# Patient Record
Sex: Female | Born: 1945
Health system: Southern US, Community
[De-identification: ages and names within clinical notes are randomized; demographics above are authoritative.]

## PROBLEM LIST (undated history)

## (undated) DIAGNOSIS — K219 Gastro-esophageal reflux disease without esophagitis: Secondary | ICD-10-CM

## (undated) DIAGNOSIS — M5136 Other intervertebral disc degeneration, lumbar region: Secondary | ICD-10-CM

## (undated) DIAGNOSIS — M47816 Spondylosis without myelopathy or radiculopathy, lumbar region: Secondary | ICD-10-CM

## (undated) DIAGNOSIS — J383 Other diseases of vocal cords: Secondary | ICD-10-CM

## (undated) DIAGNOSIS — J42 Unspecified chronic bronchitis: Secondary | ICD-10-CM

## (undated) DIAGNOSIS — K449 Diaphragmatic hernia without obstruction or gangrene: Secondary | ICD-10-CM

## (undated) DIAGNOSIS — D649 Anemia, unspecified: Secondary | ICD-10-CM

## (undated) DIAGNOSIS — K222 Esophageal obstruction: Secondary | ICD-10-CM

## (undated) DIAGNOSIS — M1710 Unilateral primary osteoarthritis, unspecified knee: Secondary | ICD-10-CM

## (undated) DIAGNOSIS — K635 Polyp of colon: Secondary | ICD-10-CM

## (undated) DIAGNOSIS — H269 Unspecified cataract: Secondary | ICD-10-CM

## (undated) DIAGNOSIS — I639 Cerebral infarction, unspecified: Secondary | ICD-10-CM

## (undated) DIAGNOSIS — M179 Osteoarthritis of knee, unspecified: Secondary | ICD-10-CM

## (undated) DIAGNOSIS — T7840XA Allergy, unspecified, initial encounter: Secondary | ICD-10-CM

## (undated) DIAGNOSIS — E785 Hyperlipidemia, unspecified: Secondary | ICD-10-CM

## (undated) DIAGNOSIS — J449 Chronic obstructive pulmonary disease, unspecified: Secondary | ICD-10-CM

## (undated) HISTORY — DX: Other intervertebral disc degeneration, lumbar region: M51.36

## (undated) HISTORY — DX: Unspecified cataract: H26.9

## (undated) HISTORY — DX: Other diseases of vocal cords: J38.3

## (undated) HISTORY — DX: Hyperlipidemia, unspecified: E78.5

## (undated) HISTORY — DX: Spondylosis without myelopathy or radiculopathy, lumbar region: M47.816

## (undated) HISTORY — DX: Gastro-esophageal reflux disease without esophagitis: K21.9

## (undated) HISTORY — DX: Esophageal obstruction: K22.2

## (undated) HISTORY — PX: COLONOSCOPY W/ BIOPSIES AND POLYPECTOMY: SHX1376

## (undated) HISTORY — DX: Diaphragmatic hernia without obstruction or gangrene: K44.9

## (undated) HISTORY — PX: CARPAL TUNNEL RELEASE: SHX101

## (undated) HISTORY — PX: ESOPHAGOGASTRODUODENOSCOPY (EGD) WITH ESOPHAGEAL DILATION: SHX5812

## (undated) HISTORY — DX: Anemia, unspecified: D64.9

## (undated) HISTORY — PX: WISDOM TOOTH EXTRACTION: SHX21

## (undated) HISTORY — DX: Osteoarthritis of knee, unspecified: M17.9

## (undated) HISTORY — PX: TONSILLECTOMY: SUR1361

## (undated) HISTORY — DX: Cerebral infarction, unspecified: I63.9

## (undated) HISTORY — DX: Polyp of colon: K63.5

## (undated) HISTORY — PX: TOTAL ABDOMINAL HYSTERECTOMY: SHX209

## (undated) HISTORY — DX: Unilateral primary osteoarthritis, unspecified knee: M17.10

## (undated) HISTORY — DX: Allergy, unspecified, initial encounter: T78.40XA

## (undated) HISTORY — PX: FOOT SURGERY: SHX648

## (undated) SURGERY — VIDEO BRONCHOSCOPY WITHOUT FLUORO
Anesthesia: Moderate Sedation

---

## 1970-12-05 HISTORY — PX: HEMORROIDECTOMY: SUR656

## 1998-07-02 ENCOUNTER — Inpatient Hospital Stay (HOSPITAL_COMMUNITY): Admission: AD | Admit: 1998-07-02 | Discharge: 1998-07-07 | Payer: Self-pay | Admitting: Family Medicine

## 1998-09-14 ENCOUNTER — Other Ambulatory Visit: Admission: RE | Admit: 1998-09-14 | Discharge: 1998-09-14 | Payer: Self-pay | Admitting: Internal Medicine

## 1999-12-30 ENCOUNTER — Encounter: Payer: Self-pay | Admitting: Family Medicine

## 1999-12-30 ENCOUNTER — Ambulatory Visit (HOSPITAL_COMMUNITY): Admission: RE | Admit: 1999-12-30 | Discharge: 1999-12-30 | Payer: Self-pay | Admitting: Family Medicine

## 2001-09-04 ENCOUNTER — Encounter (INDEPENDENT_AMBULATORY_CARE_PROVIDER_SITE_OTHER): Payer: Self-pay | Admitting: *Deleted

## 2001-09-04 ENCOUNTER — Encounter: Payer: Self-pay | Admitting: Emergency Medicine

## 2001-09-04 ENCOUNTER — Emergency Department (HOSPITAL_COMMUNITY): Admission: EM | Admit: 2001-09-04 | Discharge: 2001-09-05 | Payer: Self-pay | Admitting: Emergency Medicine

## 2001-09-11 ENCOUNTER — Encounter: Payer: Self-pay | Admitting: Cardiovascular Disease

## 2001-09-11 ENCOUNTER — Ambulatory Visit (HOSPITAL_COMMUNITY): Admission: RE | Admit: 2001-09-11 | Discharge: 2001-09-11 | Payer: Self-pay | Admitting: Cardiovascular Disease

## 2001-09-28 ENCOUNTER — Ambulatory Visit: Admission: RE | Admit: 2001-09-28 | Discharge: 2001-09-28 | Payer: Self-pay | Admitting: Pulmonary Disease

## 2001-11-29 ENCOUNTER — Encounter: Payer: Self-pay | Admitting: Emergency Medicine

## 2001-11-29 ENCOUNTER — Emergency Department (HOSPITAL_COMMUNITY): Admission: EM | Admit: 2001-11-29 | Discharge: 2001-11-29 | Payer: Self-pay | Admitting: Emergency Medicine

## 2001-12-01 ENCOUNTER — Encounter: Payer: Self-pay | Admitting: Emergency Medicine

## 2001-12-01 ENCOUNTER — Emergency Department (HOSPITAL_COMMUNITY): Admission: EM | Admit: 2001-12-01 | Discharge: 2001-12-01 | Payer: Self-pay | Admitting: *Deleted

## 2002-01-04 ENCOUNTER — Ambulatory Visit: Admission: RE | Admit: 2002-01-04 | Discharge: 2002-01-04 | Payer: Self-pay | Admitting: Pulmonary Disease

## 2002-01-09 ENCOUNTER — Inpatient Hospital Stay (HOSPITAL_COMMUNITY): Admission: EM | Admit: 2002-01-09 | Discharge: 2002-01-14 | Payer: Self-pay | Admitting: Emergency Medicine

## 2002-01-09 ENCOUNTER — Encounter: Payer: Self-pay | Admitting: Pulmonary Disease

## 2002-01-11 ENCOUNTER — Encounter: Payer: Self-pay | Admitting: Pulmonary Disease

## 2002-08-01 ENCOUNTER — Encounter: Payer: Self-pay | Admitting: Internal Medicine

## 2002-08-01 ENCOUNTER — Encounter: Admission: RE | Admit: 2002-08-01 | Discharge: 2002-08-01 | Payer: Self-pay | Admitting: Internal Medicine

## 2002-11-20 ENCOUNTER — Encounter: Payer: Self-pay | Admitting: Pulmonary Disease

## 2002-11-20 ENCOUNTER — Inpatient Hospital Stay (HOSPITAL_COMMUNITY): Admission: EM | Admit: 2002-11-20 | Discharge: 2002-11-22 | Payer: Self-pay | Admitting: Emergency Medicine

## 2002-11-22 ENCOUNTER — Encounter (INDEPENDENT_AMBULATORY_CARE_PROVIDER_SITE_OTHER): Payer: Self-pay | Admitting: *Deleted

## 2003-09-23 ENCOUNTER — Emergency Department (HOSPITAL_COMMUNITY): Admission: EM | Admit: 2003-09-23 | Discharge: 2003-09-23 | Payer: Self-pay | Admitting: Physical Therapy

## 2004-02-01 ENCOUNTER — Emergency Department (HOSPITAL_COMMUNITY): Admission: EM | Admit: 2004-02-01 | Discharge: 2004-02-01 | Payer: Self-pay | Admitting: Emergency Medicine

## 2004-02-04 ENCOUNTER — Inpatient Hospital Stay (HOSPITAL_COMMUNITY): Admission: AD | Admit: 2004-02-04 | Discharge: 2004-02-09 | Payer: Self-pay | Admitting: Pulmonary Disease

## 2004-02-09 ENCOUNTER — Encounter (INDEPENDENT_AMBULATORY_CARE_PROVIDER_SITE_OTHER): Payer: Self-pay | Admitting: *Deleted

## 2004-03-22 ENCOUNTER — Encounter: Admission: RE | Admit: 2004-03-22 | Discharge: 2004-03-22 | Payer: Self-pay | Admitting: Internal Medicine

## 2004-05-18 ENCOUNTER — Encounter: Payer: Self-pay | Admitting: Internal Medicine

## 2004-10-13 ENCOUNTER — Ambulatory Visit: Payer: Self-pay | Admitting: Pulmonary Disease

## 2004-10-20 ENCOUNTER — Ambulatory Visit: Payer: Self-pay | Admitting: Pulmonary Disease

## 2004-10-22 ENCOUNTER — Ambulatory Visit (HOSPITAL_COMMUNITY): Admission: RE | Admit: 2004-10-22 | Discharge: 2004-10-22 | Payer: Self-pay | Admitting: Internal Medicine

## 2004-10-22 ENCOUNTER — Ambulatory Visit: Payer: Self-pay | Admitting: Pulmonary Disease

## 2004-10-22 ENCOUNTER — Ambulatory Visit: Payer: Self-pay | Admitting: Internal Medicine

## 2004-11-19 ENCOUNTER — Ambulatory Visit: Payer: Self-pay | Admitting: Pulmonary Disease

## 2004-12-08 ENCOUNTER — Ambulatory Visit: Payer: Self-pay | Admitting: Pulmonary Disease

## 2004-12-31 ENCOUNTER — Ambulatory Visit: Payer: Self-pay | Admitting: Pulmonary Disease

## 2005-01-14 ENCOUNTER — Ambulatory Visit: Payer: Self-pay | Admitting: Pulmonary Disease

## 2005-02-14 ENCOUNTER — Ambulatory Visit: Payer: Self-pay | Admitting: Pulmonary Disease

## 2005-02-15 ENCOUNTER — Ambulatory Visit (HOSPITAL_COMMUNITY): Admission: RE | Admit: 2005-02-15 | Discharge: 2005-02-15 | Payer: Self-pay | Admitting: Pulmonary Disease

## 2005-02-16 ENCOUNTER — Ambulatory Visit: Payer: Self-pay | Admitting: Internal Medicine

## 2005-03-01 ENCOUNTER — Ambulatory Visit: Payer: Self-pay | Admitting: Pulmonary Disease

## 2005-04-19 ENCOUNTER — Ambulatory Visit: Payer: Self-pay | Admitting: Pulmonary Disease

## 2005-04-26 ENCOUNTER — Ambulatory Visit: Payer: Self-pay | Admitting: Pulmonary Disease

## 2005-05-20 ENCOUNTER — Ambulatory Visit: Payer: Self-pay | Admitting: Pulmonary Disease

## 2005-06-29 ENCOUNTER — Ambulatory Visit: Payer: Self-pay | Admitting: Critical Care Medicine

## 2005-07-20 ENCOUNTER — Ambulatory Visit: Payer: Self-pay | Admitting: Pulmonary Disease

## 2005-08-03 ENCOUNTER — Ambulatory Visit: Payer: Self-pay | Admitting: Pulmonary Disease

## 2005-09-02 ENCOUNTER — Ambulatory Visit: Payer: Self-pay | Admitting: Pulmonary Disease

## 2005-09-02 ENCOUNTER — Ambulatory Visit: Payer: Self-pay | Admitting: Internal Medicine

## 2005-09-26 ENCOUNTER — Ambulatory Visit: Payer: Self-pay | Admitting: Internal Medicine

## 2005-10-02 ENCOUNTER — Encounter: Admission: RE | Admit: 2005-10-02 | Discharge: 2005-10-02 | Payer: Self-pay | Admitting: Neurosurgery

## 2005-10-10 ENCOUNTER — Ambulatory Visit (HOSPITAL_COMMUNITY): Admission: RE | Admit: 2005-10-10 | Discharge: 2005-10-10 | Payer: Self-pay | Admitting: Neurosurgery

## 2005-11-02 ENCOUNTER — Ambulatory Visit: Payer: Self-pay | Admitting: Pulmonary Disease

## 2005-11-14 ENCOUNTER — Ambulatory Visit: Payer: Self-pay | Admitting: Internal Medicine

## 2005-11-30 ENCOUNTER — Ambulatory Visit: Payer: Self-pay | Admitting: Pulmonary Disease

## 2005-12-08 ENCOUNTER — Ambulatory Visit: Payer: Self-pay | Admitting: Internal Medicine

## 2005-12-08 ENCOUNTER — Ambulatory Visit (HOSPITAL_COMMUNITY): Admission: RE | Admit: 2005-12-08 | Discharge: 2005-12-08 | Payer: Self-pay | Admitting: Internal Medicine

## 2005-12-26 ENCOUNTER — Ambulatory Visit (HOSPITAL_COMMUNITY): Admission: RE | Admit: 2005-12-26 | Discharge: 2005-12-26 | Payer: Self-pay | Admitting: Neurosurgery

## 2006-01-03 ENCOUNTER — Ambulatory Visit: Payer: Self-pay | Admitting: Pulmonary Disease

## 2006-01-23 ENCOUNTER — Ambulatory Visit: Payer: Self-pay | Admitting: Internal Medicine

## 2006-01-26 ENCOUNTER — Ambulatory Visit: Payer: Self-pay | Admitting: Pulmonary Disease

## 2006-02-22 ENCOUNTER — Ambulatory Visit: Payer: Self-pay | Admitting: Pulmonary Disease

## 2006-02-27 ENCOUNTER — Ambulatory Visit: Payer: Self-pay | Admitting: Internal Medicine

## 2006-02-27 ENCOUNTER — Inpatient Hospital Stay (HOSPITAL_COMMUNITY): Admission: EM | Admit: 2006-02-27 | Discharge: 2006-03-08 | Payer: Self-pay | Admitting: Emergency Medicine

## 2006-03-02 ENCOUNTER — Ambulatory Visit: Payer: Self-pay | Admitting: Pulmonary Disease

## 2006-03-15 ENCOUNTER — Ambulatory Visit: Payer: Self-pay | Admitting: Internal Medicine

## 2006-03-27 ENCOUNTER — Ambulatory Visit: Payer: Self-pay | Admitting: Internal Medicine

## 2006-04-07 ENCOUNTER — Ambulatory Visit: Payer: Self-pay | Admitting: Pulmonary Disease

## 2006-04-18 ENCOUNTER — Ambulatory Visit (HOSPITAL_COMMUNITY): Admission: RE | Admit: 2006-04-18 | Discharge: 2006-04-18 | Payer: Self-pay | Admitting: Internal Medicine

## 2006-04-18 ENCOUNTER — Ambulatory Visit: Payer: Self-pay | Admitting: Internal Medicine

## 2006-06-12 ENCOUNTER — Ambulatory Visit: Payer: Self-pay | Admitting: Pulmonary Disease

## 2006-08-04 ENCOUNTER — Ambulatory Visit: Payer: Self-pay | Admitting: Pulmonary Disease

## 2006-10-19 ENCOUNTER — Ambulatory Visit: Payer: Self-pay | Admitting: Internal Medicine

## 2006-10-19 ENCOUNTER — Ambulatory Visit: Payer: Self-pay | Admitting: Pulmonary Disease

## 2006-10-30 ENCOUNTER — Ambulatory Visit: Payer: Self-pay

## 2006-12-07 ENCOUNTER — Ambulatory Visit: Payer: Self-pay | Admitting: Emergency Medicine

## 2007-01-24 ENCOUNTER — Ambulatory Visit: Payer: Self-pay | Admitting: Endocrinology

## 2007-01-31 ENCOUNTER — Ambulatory Visit: Payer: Self-pay | Admitting: Pulmonary Disease

## 2007-02-06 ENCOUNTER — Encounter: Admission: RE | Admit: 2007-02-06 | Discharge: 2007-02-06 | Payer: Self-pay | Admitting: Endocrinology

## 2007-02-08 ENCOUNTER — Ambulatory Visit: Payer: Self-pay | Admitting: Critical Care Medicine

## 2007-04-16 ENCOUNTER — Ambulatory Visit: Payer: Self-pay | Admitting: Pulmonary Disease

## 2007-06-05 ENCOUNTER — Ambulatory Visit: Payer: Self-pay | Admitting: Internal Medicine

## 2007-06-14 ENCOUNTER — Ambulatory Visit: Payer: Self-pay | Admitting: Internal Medicine

## 2007-06-19 ENCOUNTER — Ambulatory Visit: Payer: Self-pay | Admitting: Pulmonary Disease

## 2007-06-26 ENCOUNTER — Ambulatory Visit: Payer: Self-pay | Admitting: Internal Medicine

## 2007-07-26 ENCOUNTER — Ambulatory Visit: Payer: Self-pay | Admitting: Emergency Medicine

## 2007-08-27 ENCOUNTER — Ambulatory Visit: Payer: Self-pay | Admitting: Emergency Medicine

## 2007-09-05 DIAGNOSIS — F410 Panic disorder [episodic paroxysmal anxiety] without agoraphobia: Secondary | ICD-10-CM

## 2007-09-05 DIAGNOSIS — J383 Other diseases of vocal cords: Secondary | ICD-10-CM

## 2007-09-05 DIAGNOSIS — J45909 Unspecified asthma, uncomplicated: Secondary | ICD-10-CM | POA: Insufficient documentation

## 2007-09-05 DIAGNOSIS — J309 Allergic rhinitis, unspecified: Secondary | ICD-10-CM | POA: Insufficient documentation

## 2007-09-05 DIAGNOSIS — K219 Gastro-esophageal reflux disease without esophagitis: Secondary | ICD-10-CM | POA: Insufficient documentation

## 2007-10-03 ENCOUNTER — Ambulatory Visit: Payer: Self-pay | Admitting: Emergency Medicine

## 2007-11-12 ENCOUNTER — Ambulatory Visit: Payer: Self-pay | Admitting: Emergency Medicine

## 2007-12-12 ENCOUNTER — Ambulatory Visit: Payer: Self-pay | Admitting: Emergency Medicine

## 2008-01-11 ENCOUNTER — Ambulatory Visit: Payer: Self-pay | Admitting: Emergency Medicine

## 2008-02-11 ENCOUNTER — Ambulatory Visit: Payer: Self-pay | Admitting: Emergency Medicine

## 2008-03-03 ENCOUNTER — Telehealth (INDEPENDENT_AMBULATORY_CARE_PROVIDER_SITE_OTHER): Payer: Self-pay | Admitting: *Deleted

## 2008-03-07 ENCOUNTER — Ambulatory Visit: Payer: Self-pay | Admitting: Emergency Medicine

## 2008-03-07 DIAGNOSIS — J069 Acute upper respiratory infection, unspecified: Secondary | ICD-10-CM | POA: Insufficient documentation

## 2008-03-13 ENCOUNTER — Telehealth (INDEPENDENT_AMBULATORY_CARE_PROVIDER_SITE_OTHER): Payer: Self-pay | Admitting: *Deleted

## 2008-03-13 ENCOUNTER — Ambulatory Visit: Payer: Self-pay | Admitting: Pulmonary Disease

## 2008-03-13 DIAGNOSIS — J209 Acute bronchitis, unspecified: Secondary | ICD-10-CM

## 2008-05-01 ENCOUNTER — Telehealth: Payer: Self-pay | Admitting: Emergency Medicine

## 2008-06-19 ENCOUNTER — Telehealth (INDEPENDENT_AMBULATORY_CARE_PROVIDER_SITE_OTHER): Payer: Self-pay | Admitting: *Deleted

## 2008-06-19 ENCOUNTER — Inpatient Hospital Stay (HOSPITAL_COMMUNITY): Admission: EM | Admit: 2008-06-19 | Discharge: 2008-06-24 | Payer: Self-pay | Admitting: Emergency Medicine

## 2008-06-19 ENCOUNTER — Ambulatory Visit: Payer: Self-pay | Admitting: Internal Medicine

## 2008-06-30 ENCOUNTER — Ambulatory Visit: Payer: Self-pay | Admitting: Internal Medicine

## 2008-06-30 DIAGNOSIS — E782 Mixed hyperlipidemia: Secondary | ICD-10-CM | POA: Insufficient documentation

## 2008-06-30 DIAGNOSIS — K222 Esophageal obstruction: Secondary | ICD-10-CM | POA: Insufficient documentation

## 2008-06-30 DIAGNOSIS — Z860100 Personal history of colon polyps, unspecified: Secondary | ICD-10-CM | POA: Insufficient documentation

## 2008-06-30 DIAGNOSIS — B029 Zoster without complications: Secondary | ICD-10-CM | POA: Insufficient documentation

## 2008-06-30 DIAGNOSIS — E119 Type 2 diabetes mellitus without complications: Secondary | ICD-10-CM

## 2008-06-30 DIAGNOSIS — M810 Age-related osteoporosis without current pathological fracture: Secondary | ICD-10-CM | POA: Insufficient documentation

## 2008-06-30 DIAGNOSIS — E785 Hyperlipidemia, unspecified: Secondary | ICD-10-CM | POA: Insufficient documentation

## 2008-06-30 DIAGNOSIS — M79609 Pain in unspecified limb: Secondary | ICD-10-CM

## 2008-07-01 ENCOUNTER — Ambulatory Visit: Payer: Self-pay | Admitting: Internal Medicine

## 2008-08-05 ENCOUNTER — Ambulatory Visit: Payer: Self-pay | Admitting: Emergency Medicine

## 2008-09-03 ENCOUNTER — Ambulatory Visit: Payer: Self-pay | Admitting: Emergency Medicine

## 2008-11-05 ENCOUNTER — Ambulatory Visit: Payer: Self-pay | Admitting: Emergency Medicine

## 2008-11-05 DIAGNOSIS — R0683 Snoring: Secondary | ICD-10-CM | POA: Insufficient documentation

## 2008-12-16 ENCOUNTER — Encounter: Payer: Self-pay | Admitting: Internal Medicine

## 2008-12-18 ENCOUNTER — Ambulatory Visit: Payer: Self-pay | Admitting: Pulmonary Disease

## 2008-12-18 ENCOUNTER — Ambulatory Visit (HOSPITAL_BASED_OUTPATIENT_CLINIC_OR_DEPARTMENT_OTHER): Admission: RE | Admit: 2008-12-18 | Discharge: 2008-12-18 | Payer: Self-pay | Admitting: Emergency Medicine

## 2008-12-22 ENCOUNTER — Encounter: Payer: Self-pay | Admitting: Internal Medicine

## 2009-02-02 ENCOUNTER — Telehealth (INDEPENDENT_AMBULATORY_CARE_PROVIDER_SITE_OTHER): Payer: Self-pay | Admitting: *Deleted

## 2009-02-05 ENCOUNTER — Ambulatory Visit: Payer: Self-pay | Admitting: Emergency Medicine

## 2009-02-05 ENCOUNTER — Telehealth: Payer: Self-pay | Admitting: Internal Medicine

## 2009-02-16 ENCOUNTER — Ambulatory Visit: Payer: Self-pay | Admitting: Internal Medicine

## 2009-02-16 DIAGNOSIS — R1310 Dysphagia, unspecified: Secondary | ICD-10-CM | POA: Insufficient documentation

## 2009-02-20 ENCOUNTER — Ambulatory Visit: Payer: Self-pay | Admitting: Internal Medicine

## 2009-03-19 ENCOUNTER — Ambulatory Visit: Payer: Self-pay | Admitting: Emergency Medicine

## 2009-03-20 ENCOUNTER — Ambulatory Visit: Payer: Self-pay | Admitting: Internal Medicine

## 2009-03-20 DIAGNOSIS — R5381 Other malaise: Secondary | ICD-10-CM

## 2009-03-20 DIAGNOSIS — R252 Cramp and spasm: Secondary | ICD-10-CM | POA: Insufficient documentation

## 2009-03-20 DIAGNOSIS — R5383 Other fatigue: Secondary | ICD-10-CM

## 2009-03-20 DIAGNOSIS — I8000 Phlebitis and thrombophlebitis of superficial vessels of unspecified lower extremity: Secondary | ICD-10-CM | POA: Insufficient documentation

## 2009-03-20 LAB — CONVERTED CEMR LAB
Basophils Relative: 1.1 % (ref 0.0–3.0)
Calcium: 9 mg/dL (ref 8.4–10.5)
Cholesterol: 144 mg/dL (ref 0–200)
Creatinine,U: 192.2 mg/dL
Eosinophils Absolute: 0.3 10*3/uL (ref 0.0–0.7)
Eosinophils Relative: 4.5 % (ref 0.0–5.0)
Folate: 12.1 ng/mL
Glucose, Bld: 84 mg/dL (ref 70–99)
HDL: 49.6 mg/dL (ref >39.00)
Hemoglobin: 12.1 g/dL (ref 12.0–15.0)
Lymphs Abs: 2.4 10*3/uL (ref 0.7–4.0)
MCHC: 32.8 g/dL (ref 30.0–36.0)
Microalb Creat Ratio: 13 mg/g (ref 0.0–30.0)
Monocytes Absolute: 0.5 10*3/uL (ref 0.1–1.0)
Neutro Abs: 4.1 10*3/uL (ref 1.4–7.7)
Platelets: 368 10*3/uL (ref 150.0–400.0)
Potassium: 3.6 meq/L (ref 3.5–5.1)
Total CHOL/HDL Ratio: 3
VLDL: 17.4 mg/dL (ref 0.0–40.0)

## 2009-04-17 ENCOUNTER — Telehealth (INDEPENDENT_AMBULATORY_CARE_PROVIDER_SITE_OTHER): Payer: Self-pay | Admitting: *Deleted

## 2009-04-21 ENCOUNTER — Telehealth (INDEPENDENT_AMBULATORY_CARE_PROVIDER_SITE_OTHER): Payer: Self-pay | Admitting: *Deleted

## 2009-04-29 ENCOUNTER — Encounter: Payer: Self-pay | Admitting: Emergency Medicine

## 2009-07-17 ENCOUNTER — Ambulatory Visit: Payer: Self-pay | Admitting: Emergency Medicine

## 2009-10-05 ENCOUNTER — Ambulatory Visit: Payer: Self-pay | Admitting: Internal Medicine

## 2009-10-05 ENCOUNTER — Ambulatory Visit: Payer: Self-pay | Admitting: Emergency Medicine

## 2009-10-07 ENCOUNTER — Telehealth: Payer: Self-pay | Admitting: Internal Medicine

## 2009-10-08 ENCOUNTER — Ambulatory Visit: Payer: Self-pay | Admitting: Internal Medicine

## 2009-10-09 ENCOUNTER — Telehealth: Payer: Self-pay | Admitting: Emergency Medicine

## 2009-10-14 ENCOUNTER — Ambulatory Visit: Payer: Self-pay | Admitting: Internal Medicine

## 2009-10-21 ENCOUNTER — Encounter (INDEPENDENT_AMBULATORY_CARE_PROVIDER_SITE_OTHER): Payer: Self-pay | Admitting: *Deleted

## 2009-10-27 ENCOUNTER — Ambulatory Visit: Payer: Self-pay | Admitting: Emergency Medicine

## 2009-10-27 ENCOUNTER — Telehealth: Payer: Self-pay | Admitting: Emergency Medicine

## 2010-01-14 ENCOUNTER — Encounter: Payer: Self-pay | Admitting: Internal Medicine

## 2010-01-21 ENCOUNTER — Ambulatory Visit: Payer: Self-pay | Admitting: Emergency Medicine

## 2010-02-03 ENCOUNTER — Ambulatory Visit: Payer: Self-pay | Admitting: Emergency Medicine

## 2010-03-23 ENCOUNTER — Telehealth: Payer: Self-pay | Admitting: Internal Medicine

## 2010-03-23 DIAGNOSIS — N760 Acute vaginitis: Secondary | ICD-10-CM | POA: Insufficient documentation

## 2010-03-24 ENCOUNTER — Encounter (INDEPENDENT_AMBULATORY_CARE_PROVIDER_SITE_OTHER): Payer: Self-pay | Admitting: *Deleted

## 2010-04-09 ENCOUNTER — Encounter: Payer: Self-pay | Admitting: Emergency Medicine

## 2010-04-30 ENCOUNTER — Encounter: Payer: Self-pay | Admitting: Internal Medicine

## 2010-05-06 ENCOUNTER — Telehealth: Payer: Self-pay | Admitting: Emergency Medicine

## 2010-05-20 ENCOUNTER — Ambulatory Visit: Payer: Self-pay | Admitting: Emergency Medicine

## 2010-05-20 DIAGNOSIS — D239 Other benign neoplasm of skin, unspecified: Secondary | ICD-10-CM | POA: Insufficient documentation

## 2010-07-05 ENCOUNTER — Encounter: Payer: Self-pay | Admitting: Internal Medicine

## 2010-07-05 ENCOUNTER — Ambulatory Visit: Payer: Self-pay | Admitting: Internal Medicine

## 2010-07-05 DIAGNOSIS — N959 Unspecified menopausal and perimenopausal disorder: Secondary | ICD-10-CM | POA: Insufficient documentation

## 2010-07-05 DIAGNOSIS — M1990 Unspecified osteoarthritis, unspecified site: Secondary | ICD-10-CM | POA: Insufficient documentation

## 2010-07-05 DIAGNOSIS — M1710 Unilateral primary osteoarthritis, unspecified knee: Secondary | ICD-10-CM

## 2010-07-05 LAB — CONVERTED CEMR LAB
AST: 20 units/L (ref 0–37)
Alkaline Phosphatase: 68 units/L (ref 39–117)
Basophils Absolute: 0 10*3/uL (ref 0.0–0.1)
Bilirubin Urine: NEGATIVE
CO2: 29 meq/L (ref 19–32)
Eosinophils Relative: 3.5 % (ref 0.0–5.0)
Folate: 9.2 ng/mL
GFR calc non Af Amer: 96.7 mL/min (ref >60)
HCT: 38.1 % (ref 36.0–46.0)
Hemoglobin, Urine: NEGATIVE
Ketones, ur: NEGATIVE mg/dL
Lymphocytes Relative: 27.2 % (ref 12.0–46.0)
MCV: 71.1 fL — ABNORMAL LOW (ref 78.0–100.0)
Monocytes Absolute: 0.6 10*3/uL (ref 0.1–1.0)
Neutrophils Relative %: 62.5 % (ref 43.0–77.0)
Nitrite: NEGATIVE
Platelets: 576 10*3/uL — ABNORMAL HIGH (ref 150.0–400.0)
Potassium: 4.5 meq/L (ref 3.5–5.1)
Sed Rate: 19 mm/hr (ref 0–22)
Specific Gravity, Urine: >=1.03 (ref 1.000–1.030)
Total Bilirubin: 0.7 mg/dL (ref 0.3–1.2)
Transferrin: 181.3 mg/dL — ABNORMAL LOW (ref 212.0–360.0)
Triglycerides: 49 mg/dL (ref 0.0–149.0)
Urobilinogen, UA: 0.2 (ref 0.0–1.0)
pH: 5 (ref 5.0–8.0)

## 2010-07-19 ENCOUNTER — Encounter: Payer: Self-pay | Admitting: Internal Medicine

## 2010-07-19 ENCOUNTER — Ambulatory Visit: Payer: Self-pay | Admitting: Internal Medicine

## 2010-09-07 ENCOUNTER — Ambulatory Visit: Payer: Self-pay | Admitting: Emergency Medicine

## 2010-10-08 ENCOUNTER — Ambulatory Visit: Payer: Self-pay | Admitting: Emergency Medicine

## 2010-11-18 ENCOUNTER — Ambulatory Visit: Payer: Self-pay | Admitting: Emergency Medicine

## 2010-11-18 DIAGNOSIS — R42 Dizziness and giddiness: Secondary | ICD-10-CM

## 2010-11-22 ENCOUNTER — Telehealth (INDEPENDENT_AMBULATORY_CARE_PROVIDER_SITE_OTHER): Payer: Self-pay | Admitting: *Deleted

## 2010-11-24 ENCOUNTER — Telehealth (INDEPENDENT_AMBULATORY_CARE_PROVIDER_SITE_OTHER): Payer: Self-pay | Admitting: *Deleted

## 2010-11-26 ENCOUNTER — Encounter: Payer: Self-pay | Admitting: Emergency Medicine

## 2011-01-04 NOTE — Medication Information (Signed)
Summary: Symbicort/AZ&Me Prescription  Symbicort/AZ&Me Prescription   Imported By: Sherian Rein 04/19/2010 13:47:58  _____________________________________________________________________  External Attachment:    Type:   Image     Comment:   External Document

## 2011-01-04 NOTE — Progress Notes (Signed)
Summary: referral  Phone Note Call from Patient Call back at Home Phone [phone]   Caller: Patient [phone] Summary of Call: pt called requesting referral to GYN for vaginal irritation Initial call taken by: Margaret Pyle, CMA,  March 23, 2010 11:01 AM  Follow-up for Phone Call        ok  - done per emr Follow-up by: Corwin Levins MD,  March 23, 2010 1:08 PM  New Problems: VAGINITIS (ICD-616.10)   New Problems: VAGINITIS (ICD-616.10)

## 2011-01-04 NOTE — Assessment & Plan Note (Signed)
Summary: YEARLY F/U //WILL COME FASTING//#/CD   Vital Signs:  Patient profile:   65 year old female Height:      62 inches Weight:      206.25 pounds BMI:     37.86 O2 Sat:      97 % on Room air Temp:     98.1 degrees F oral Pulse rate:   78 / minute BP sitting:   120 / 68  (left arm) Cuff size:   large  Vitals Entered By: Zella Ball Ewing CMA Duncan Dull) (July 05, 2010 8:18 AM)  O2 Flow:  Room air  Preventive Care Screening  Mammogram:    Date:  01/05/2010    Results:  normal   CC: yearly/RE   Primary Care Provider:  Dr Oliver Barre MD  CC:  yearly/RE.  History of Present Illness: overall dong ok;  Pt denies CP, worsening sob, doe, wheezing, orthopnea, pnd, worsening LE edema, palps, dizziness or syncope Pt denies new neuro symptoms such as headache, facial or extremity weakness   Pt denies polydipsia, polyuria, or low sugar symptoms such as shakiness improved with eating.  Overall good compliance with meds, trying to follow low chol, DM diet, wt stable, little excercise however Also c/o intermittent mild occasional crampy abd pains without fever, n/v, change in bowels, or blood;  Occurred a few yrs ago, but resolved,  Recent predniosne stopped but still hard to lose wt. Having significant night sweats without known malignancy, fever, psych abnormality.    Here for wellness Diet: Heart Healthy or DM if diabetic Physical Activities: Sedentary Depression/mood screen: Negative Hearing: Intact bilateral Visual Acuity: Grossly normal, gets exam yearly, wears glasses ADL's: Capable  Fall Risk: None Home Safety: Good Cognitive Impairment:  Gen appearance, affect, speech, memory, attention & motor skills grossly intact End-of-Life Planning: Advance directive - Full code/I agree   Preventive Screening-Counseling & Management      Drug Use:  no.    Problems Prior to Update: 1)  Nevus  (ICD-216.9) 2)  Vaginitis  (ICD-616.10) 3)  Fatigue  (ICD-780.79) 4)  Superficial  Thrombophlebitis  (ICD-451.0) 5)  Cramp of Limb  (ICD-729.82) 6)  Dysphagia Unspecified  (ICD-787.20) 7)  Snoring  (ICD-786.09) 8)  Osteoporosis  (ICD-733.00) 9)  Colonic Polyps, Hx of  (ICD-V12.72) 10)  Diabetes Mellitus, Type II  (ICD-250.00) 11)  Esophageal Stricture  (ICD-530.3) 12)  Hyperlipidemia  (ICD-272.4) 13)  Foot Pain, Right  (ICD-729.5) 14)  Herpes Zoster  (ICD-053.9) 15)  Gerd  (ICD-530.81) 16)  Acute Bronchitis  (ICD-466.0) 17)  Viral Uri  (ICD-465.9) 18)  Panic Attack  (ICD-300.01) 19)  Asthma, Mild  (ICD-493.90) 20)  Vocal Cord Disorder  (ICD-478.5) 21)  Allergic Rhinitis  (ICD-477.9) 22)  Gerd, Severe  (ICD-530.81)  Medications Prior to Update: 1)  Pravachol 40 Mg  Tabs (Pravastatin Sodium) .... Take 1 Tablet By Mouth Once A Day 2)  Symbicort 160-4.5 Mcg/act  Aero (Budesonide-Formoterol Fumarate) .... Inhale 2 Puffs Two Times A Day, Rinse Your Mouth After Using 3)  Aleve 220 Mg  Tabs (Naproxen Sodium) .... As Needed 4)  Proventil Hfa 108 (90 Base) Mcg/act  Aers (Albuterol Sulfate) .... Inhale 2 Puffs Every 4 Hours As Needed 5)  Omeprazole 20 Mg Tbec (Omeprazole) .... Take 1 Tablet By Mouth Two Times A Day 6)  Albuterol Sulfate (2.5 Mg/62ml) 0.083% Nebu (Albuterol Sulfate) .Marland Kitchen.. 1 Nebulized Up To Every 4 Hours If Needed For Shortness of Breath 7)  Flexeril 5 Mg Tabs (Cyclobenzaprine Hcl) .Marland KitchenMarland KitchenMarland Kitchen  1 By Mouth Three Times A Day As Needed 8)  Aspirin 81 Mg  Tabs (Aspirin) .... One Tablet By Mouth Once Daily 9)  Promethazine Vc/codeine 6.25-5-10 Mg/46ml Syrp (Phenyleph-Promethazine-Cod) .Marland Kitchen.. 1-2 Tsp Every 4-6 Hr As Needed Cough  Current Medications (verified): 1)  Pravachol 40 Mg  Tabs (Pravastatin Sodium) .... Take 1 Tablet By Mouth Once A Day 2)  Symbicort 160-4.5 Mcg/act  Aero (Budesonide-Formoterol Fumarate) .... Inhale 2 Puffs Two Times A Day, Rinse Your Mouth After Using 3)  Aleve 220 Mg  Tabs (Naproxen Sodium) .... As Needed 4)  Proventil Hfa 108 (90 Base) Mcg/act  Aers  (Albuterol Sulfate) .... Inhale 2 Puffs Every 4 Hours As Needed 5)  Omeprazole 20 Mg Tbec (Omeprazole) .... Take 1 Tablet By Mouth Two Times A Day 6)  Albuterol Sulfate (2.5 Mg/51ml) 0.083% Nebu (Albuterol Sulfate) .Marland Kitchen.. 1 Nebulized Up To Every 4 Hours If Needed For Shortness of Breath 7)  Flexeril 5 Mg Tabs (Cyclobenzaprine Hcl) .Marland Kitchen.. 1 By Mouth Three Times A Day As Needed 8)  Aspirin 81 Mg  Tabs (Aspirin) .... One Tablet By Mouth Once Daily 9)  Promethazine Vc/codeine 6.25-5-10 Mg/23ml Syrp (Phenyleph-Promethazine-Cod) .Marland Kitchen.. 1-2 Tsp Every 4-6 Hr As Needed Cough 10)  Clonidine Hcl 0.1 Mg Tabs (Clonidine Hcl) .Marland Kitchen.. 1 By Mouth At Bedtime  Allergies (verified): 1)  ! Penicillin V Potassium (Penicillin V Potassium)  Past History:  Family History: Last updated: 07/01/2008 cancer-sister heart disease-mother heart attack asthma-brother and uncle  Social History: Last updated: 07/05/2010 Never Smoked Alcohol use-no Daily Caffeine Use 2 per day Illicit Drug Use - no Patient gets regular exercise. Drug use-no  Risk Factors: Exercise: yes (02/16/2009)  Risk Factors: Smoking Status: never (06/30/2008)  Past Medical History: Allergic rhinitis Asthma GERD Hyperlipidemia vocal cord dysfunction esophageal stricture Diabetes mellitus, type II - diet/with steroids Colonic polyps, hx of Osteoporosis c-spine disc disease bilat knee DJD  MD rosterL  Dr Delton Coombes - pulm                     Dr Marina Goodell - GI                    Derm -  Dr Danella Deis                     optho  - can't remember                     GYN - Dr Darrick Penna                     ortho - Dr Lequita Halt  Past Surgical History: Reviewed history from 06/30/2008 and no changes required. Carpal tunnel release - bilat Hysterectomy  Social History: Reviewed history from 02/16/2009 and no changes required. Never Smoked Alcohol use-no Daily Caffeine Use 2 per day Illicit Drug Use - no Patient gets regular exercise. Drug use-no  Review  of Systems       The patient complains of weight gain.  The patient denies anorexia, fever, weight loss, vision loss, decreased hearing, hoarseness, chest pain, syncope, dyspnea on exertion, peripheral edema, prolonged cough, headaches, hemoptysis, melena, hematochezia, severe indigestion/heartburn, hematuria, muscle weakness, suspicious skin lesions, transient blindness, difficulty walking, depression, unusual weight change, abnormal bleeding, enlarged lymph nodes, angioedema, breast masses, and testicular masses.         all otherwise negative per pt -  except for nightly night sweats and occasional crampy ;  No fever, wt loss, night sweats, loss of appetite or other constitutional symptoms ; also wtih ongoing fatigue but no OSA symtpoms or suicidal ideation  Physical Exam  General:  alert and overweight-appearing.   Head:  normocephalic and atraumatic.   Eyes:  vision grossly intact, pupils equal, and pupils round.   Ears:  R ear normal and L ear normal.   Nose:  no external deformity and no nasal discharge.   Mouth:  no gingival abnormalities and pharynx pink and moist.   Neck:  supple and no masses.   Lungs:  normal respiratory effort and normal breath sounds.   Heart:  normal rate and regular rhythm.   Abdomen:  soft, non-tender, and normal bowel sounds.   Msk:  no joint tenderness and no joint swelling.   Extremities:  no edema, no erythema  Neurologic:  cranial nerves II-XII intact and strength normal in all extremities.   Skin:  color normal and no rashes.   Psych:  not anxious appearing and not depressed appearing.     Impression & Recommendations:  Problem # 1:  Preventive Health Care (ICD-V70.0)  Overall doing well, age appropriate education and counseling updated and referral for appropriate preventive services done unless declined, immunizations up to date or declined, diet counseling done if overweight, urged to quit smoking if smokes , most recent labs reviewed and current  ordered if appropriate, ecg reviewed or declined (interpretation per ECG scanned in the EMR if done); information regarding Medicare Prevention requirements given if appropriate; speciality referrals updated as appropriate   Orders: EKG w/ Interpretation (93000) First annual wellness visit with prevention plan  (Z6109)  Problem # 2:  MENOPAUSAL DISORDER (ICD-627.9)  due for dxa , also to check VIT d, to add the clonidine at bedtime for night sweats  Orders: T-Bone Densitometry (60454) T-Vitamin D (25-Hydroxy) (09811-91478)  Problem # 3:  FATIGUE (ICD-780.79)  exam benign, to check labs below; follow with expectant management - for routine labs  Orders: TLB-BMP (Basic Metabolic Panel-BMET) (80048-METABOL) TLB-CBC Platelet - w/Differential (85025-CBCD) TLB-Hepatic/Liver Function Pnl (80076-HEPATIC) TLB-TSH (Thyroid Stimulating Hormone) (84443-TSH) TLB-Sedimentation Rate (ESR) (85652-ESR) TLB-IBC Pnl (Iron/FE;Transferrin) (83550-IBC) TLB-B12 + Folate Pnl (82746_82607-B12/FOL) TLB-Udip ONLY (81003-UDIP)  Problem # 4:  DIABETES MELLITUS, TYPE II (ICD-250.00)  Her updated medication list for this problem includes:    Aspirin 81 Mg Tabs (Aspirin) ..... One tablet by mouth once daily  Labs Reviewed: Creat: 0.8 (03/20/2009)    Reviewed HgBA1c results: 5.8 (03/20/2009)  Orders: TLB-A1C / Hgb A1C (Glycohemoglobin) (83036-A1C) TLB-Lipid Panel (80061-LIPID)  Problem # 5:  HYPERLIPIDEMIA (ICD-272.4)  Her updated medication list for this problem includes:    Pravachol 40 Mg Tabs (Pravastatin sodium) .Marland Kitchen... Take 1 tablet by mouth once a day  Labs Reviewed: SGOT: 21 (03/20/2009)   SGPT: 18 (03/20/2009)   HDL:49.60 (03/20/2009)  LDL:77 (03/20/2009)  Chol:144 (03/20/2009)  Trig:87.0 (03/20/2009) Pt to continue diet efforts, good med tolerance; to check labs - goal LDL less than 70 , stable overall by hx and exam, ok to continue meds/tx as is   Orders: Prescription Created  Electronically [phone])  Complete Medication List: 1)  Pravachol 40 Mg Tabs (Pravastatin sodium) .... Take 1 tablet by mouth once a day 2)  Symbicort 160-4.5 Mcg/act Aero (Budesonide-formoterol fumarate) .... Inhale 2 puffs two times a day, rinse your mouth after using 3)  Aleve 220 Mg Tabs (Naproxen sodium) .... As needed 4)  Proventil Hfa 108 (90 Base) Mcg/act Aers (Albuterol sulfate) .... Inhale 2 puffs  every 4 hours as needed 5)  Omeprazole 20 Mg Tbec (Omeprazole) .... Take 1 tablet by mouth two times a day 6)  Albuterol Sulfate (2.5 Mg/54ml) 0.083% Nebu (Albuterol sulfate) .Marland Kitchen.. 1 nebulized up to every 4 hours if needed for shortness of breath 7)  Flexeril 5 Mg Tabs (Cyclobenzaprine hcl) .Marland Kitchen.. 1 by mouth three times a day as needed 8)  Aspirin 81 Mg Tabs (Aspirin) .... One tablet by mouth once daily 9)  Promethazine Vc/codeine 6.25-5-10 Mg/56ml Syrp (Phenyleph-promethazine-cod) .Marland Kitchen.. 1-2 tsp every 4-6 hr as needed cough 10)  Clonidine Hcl 0.1 Mg Tabs (Clonidine hcl) .Marland Kitchen.. 1 by mouth at bedtime  Patient Instructions: 1)  please schedule your bone density before leaving today 2)  Please go to the Lab in the basement for your blood and/or urine tests today 3)  Your EKG was good today 4)  start the clonidine at night for night sweats 5)  Continue all previous medications as before this visit  6)  Please schedule a follow-up appointment in 6 months Prescriptions: PRAVACHOL 40 MG  TABS (PRAVASTATIN SODIUM) Take 1 tablet by mouth once a day  #90 x 3   Entered and Authorized by:   Corwin Levins MD   Signed by:   Corwin Levins MD on 07/05/2010   Method used:   Print then Give to Patient   RxID:   [id number] CLONIDINE HCL 0.1 MG TABS (CLONIDINE HCL) 1 by mouth at bedtime  #90 x 3   Entered and Authorized by:   Corwin Levins MD   Signed by:   Corwin Levins MD on 07/05/2010   Method used:   Print then Give to Patient   RxID:   [id number]

## 2011-01-04 NOTE — Miscellaneous (Signed)
Summary: Orders Update   Clinical Lists Changes  Orders: Added new Test order of T-Lumbar Vertebral Assessment (77082) - Signed 

## 2011-01-04 NOTE — Letter (Signed)
Summary: [facility] Consult Scheduled Letter  [facility]  [address] [place], [place] [address]   Phone: [phone]  Fax: [phone]      03/24/2010 MRN: [id number]  [facility] [address] [place], [place]  [address]    Dear Ms. Witczak,      We have scheduled an appointment for you.  At the recommendation of Dr.John, we have scheduled you a consult with Raynelle Fanning Fisher(NP) on 04/05/10 at 2:00pm.  Their phone number is [phone].  If this appointment day and time is not convenient for you, please feel free to call the office of the doctor you are being referred to at the number listed above and reschedule the appointment.    [facility]  [address] [place], [place] [address]    Thank you,  Patient Care Coordinator [facility]

## 2011-01-04 NOTE — Assessment & Plan Note (Signed)
Summary: asthma, VCD, GERD   Visit Type:  Follow-up Copy to:  Levy Pupa MD Primary Provider/Referring Provider:  Dr Oliver Barre MD  CC:  Asthma...VCD...the patient c/o increased SOB with exertion/some at rest and a dry cough and wheezing for 3 weeks...patient would like flu shot today.  History of Present Illness: 65 -year-old woman with a history of asthma and vocal cord dysfunction both of which have been significantly impacted by severe esophageal reflux.  Has a hx esophageal stricture dilations by Dr Marina Goodell.  On omeprazole 20mg  two times a day. Uses Symbicort bid.   PSG 12/18/08  - No OSA. In retrospect I believe that her awakenings are due to reflux and esophageal dysmotility.   ROV 10/27/09 -- Returns for follow up today. She had a flare of her AFL 2 weeks ago after she had an esophageal dilation by Dr Marina Goodell (about 3 weeks ago). Treated with burst of pred, now off. Reflux symptoms are fairly well controlled, better since her dilation.   ROV 01/21/10 -- Has been feeling well, then about a week ago began to have cough prod of yellow phlegm. The phlegm is not starting to subside. Evolved SOB with these symptoms. No known sick contacts. No sore throat, hoarse voice. Has heard wheezing in her throat. Taking Symbicort, seems to help.   ROV 02/03/10 -- Feels much better since last time. Still with some cough, rare UA noise. The prednisone helped, reflux may have suffered from being on the pred. Taking Symbicort reliably, not bothering her throat. Good exertional tolerance.   ROV 05/20/10 -- regular f/u for VCD and asthma. No problems since last visit. Has had good control of both her GERD and UA irritation right now. Takes Symbicort reliably. Has been walking, has lost 9 lbs since last visit. Mentions today that she has a nevus on her chest that has become painful when her clothes rub against it.   ROV 09/07/10 -- f/u for allergies, GERD, VCD, asthma. Gained back 4 lbs. For the last 3 weeks more  trouble with SOB and UA wheezing. Using SABA 1x a day. Taking Symbicort two times a day. She was recently started on clonidine in August, she questions whether this is a factor in her SOB. Occas globus sensation. Freq cough. No real heartburn symptoms. Occas gets cough and irritation from using symbicort.   Current Medications (verified): 1)  Pravachol 40 Mg  Tabs (Pravastatin Sodium) .... Take 1 Tablet By Mouth Once A Day 2)  Symbicort 160-4.5 Mcg/act  Aero (Budesonide-Formoterol Fumarate) .... Inhale 2 Puffs Two Times A Day, Rinse Your Mouth After Using 3)  Aleve 220 Mg  Tabs (Naproxen Sodium) .... As Needed 4)  Proventil Hfa 108 (90 Base) Mcg/act  Aers (Albuterol Sulfate) .... Inhale 2 Puffs Every 4 Hours As Needed 5)  Omeprazole 20 Mg Tbec (Omeprazole) .... Take 1 Tablet By Mouth Two Times A Day 6)  Albuterol Sulfate (2.5 Mg/50ml) 0.083% Nebu (Albuterol Sulfate) .Marland Kitchen.. 1 Nebulized Up To Every 4 Hours If Needed For Shortness of Breath 7)  Flexeril 5 Mg Tabs (Cyclobenzaprine Hcl) .Marland Kitchen.. 1 By Mouth Three Times A Day As Needed 8)  Aspirin 81 Mg  Tabs (Aspirin) .... One Tablet By Mouth Once Daily 9)  Clonidine Hcl 0.1 Mg Tabs (Clonidine Hcl) .Marland Kitchen.. 1 By Mouth At Bedtime  Allergies (verified): 1)  ! Penicillin V Potassium (Penicillin V Potassium)  Vital Signs:  Patient profile:   65 year old female Height:      62 inches (  157.48 cm) Weight:      206 pounds (93.64 kg) BMI:     37.81 O2 Sat:      99 % on Room air Temp:     98.1 degrees F (36.72 degrees C) oral Pulse rate:   85 / minute BP sitting:   106 / 70  (left arm) Cuff size:   large  Vitals Entered By: Michel Bickers CMA (September 07, 2010 10:03 AM)  O2 Sat at Rest %:  99 O2 Flow:  Room air CC: Asthma...VCD...the patient c/o increased SOB with exertion/some at rest and a dry cough and wheezing for 3 weeks...patient would like flu shot today Comments Medications reviewed with patient Daytime phone verified. Michel Bickers CMA  September 07, 2010  10:05 AM   Physical Exam  General:  alert and overweight-appearing.   Head:  normocephalic and atraumatic.   Eyes:  conjunctiva and sclera clear Nose:  no external deformity and no nasal discharge.   Mouth:  posterior pharyngeal erythema Neck:  supple and no masses. + stridor today Chest Wall:  no deformities noted Lungs:  clear bilaterally, + referred UA noise Heart:  normal rate, regular rhythm, and no murmur.   Abdomen:  not examined Extremities:  no clubbing, cyanosis, edema, or deformity noted Neurologic:  Alert and  oriented x4;   Skin:  4mm, round black nevus with regular border Psych:  Alert and cooperative. Normal mood and affect.   Impression & Recommendations:  Problem # 1:  VOCAL CORD DISORDER (ICD-478.5)  More dyspnea and wheeze last 3 weeks, sounds upper airway on exam. Consider asthma as the source but doubt. We discussed empiric prednisone but she would like to avoid if possible.  - continue maintainance GERD therapy - avoid throat clearing - she wants to take trial off clonidine to see if this helps the breathing, will stop x 1 week then restart. She will record her symptoms.  - rov 1 month  Orders: Prescription Created Electronically [phone]) Est. Patient Level IV (30160)  Problem # 2:  GERD, SEVERE (ICD-530.81)  Her updated medication list for this problem includes:    Omeprazole 20 Mg Tbec (Omeprazole) .Marland Kitchen... Take 1 tablet by mouth two times a day  Orders: Prescription Created Electronically [phone]) Est. Patient Level IV (35573)  Other Orders: Flu Vaccine 20yrs + MEDICARE PATIENTS (U2025) Administration Flu vaccine - MCR (K2706)  Patient Instructions: 1)  Stop clonidine for the next week to see if your breathing improves.  2)  Continue your omeprazole two times a day 3)  Continue your Symbicort two times a day  4)  Use your Proventil or albuterol nebulizer as needed  5)  Follow up with Dr Delton Coombes in 1 month.  6)  Flu shot today.   Prescriptions: ALBUTEROL SULFATE (2.5 MG/3ML) 0.083% NEBU (ALBUTEROL SULFATE) 1 nebulized up to every 4 hours if needed for shortness of breath  #30 x 5   Entered and Authorized by:   Leslye Peer MD   Signed by:   Leslye Peer MD on 09/07/2010   Method used:   Electronically to        [facility] [phone]* (retail)       [address]       [place], [place]  [address]       Ph: [phone]       Fax: [phone]   RxID:   [id number]    lu Vaccine Consent Questions     Do you  have a history of severe allergic reactions to this vaccine? no    Any prior history of allergic reactions to egg and/or gelatin? no    Do you have a sensitivity to the preservative Thimersol? no    Do you have a past history of Guillan-Barre Syndrome? no    Do you currently have an acute febrile illness? no    Have you ever had a severe reaction to latex? no    Vaccine information given and explained to patient? yes    Are you currently pregnant? no    Lot Number:[id number]   Exp Date:06/04/2011   Site Given  Left Deltoid IMflu

## 2011-01-04 NOTE — Progress Notes (Signed)
Summary: nos appt  Phone Note Call from Patient   Caller: [email or web address]  Call For: Saanvi Hakala Summary of Call: Rsc nos from 6/1 to 6/16 @ 9:40a. Initial call taken by: Darletta Moll,  May 06, 2010 3:33 PM

## 2011-01-04 NOTE — Assessment & Plan Note (Signed)
Summary: VCD, asthma, nevus   Visit Type:  Follow-up Copy to:  Levy Pupa MD Primary Provider/Referring Provider:  Dr Oliver Barre MD  CC:  Asthma. VCD. The patient says her brething has improved slightly. C/o occasional wheezing. Denies any cough..  History of Present Illness: 65 -year-old woman with a history of asthma and vocal cord dysfunction both of which have been significantly impacted by severe esophageal reflux.  Has a hx esophageal stricture dilations by Dr Marina Goodell.  On omeprazole 20mg  two times a day. Uses Symbicort bid.   PSG 12/18/08  - No OSA. In retrospect I believe that her awakenings are due to reflux and esophageal dysmotility.   ROV 10/27/09 -- Returns for follow up today. She had a flare of her AFL 2 weeks ago after she had an esophageal dilation by Dr Marina Goodell (about 3 weeks ago). Treated with burst of pred, now off. Reflux symptoms are fairly well controlled, better since her dilation.   ROV 01/21/10 -- Has been feeling well, then about a week ago began to have cough prod of yellow phlegm. The phlegm is not starting to subside. Evolved SOB with these symptoms. No known sick contacts. No sore throat, hoarse voice. Has heard wheezing in her throat. Taking Symbicort, seems to help.   ROV 02/03/10 -- Feels much better since last time. Still with some cough, rare UA noise. The prednisone helped, reflux may have suffered from being on the pred. Taking Symbicort reliably, not bothering her throat. Good exertional tolerance.   ROV 05/20/10 -- regular f/u for VCD and asthma. No problems since last visit. Has had good control of both her GERD and UA irritation right now. Takes Symbicort reliably. Has been walking, has lost 9 lbs since last visit. Mentions today that she has a nevus on her chest that has become painful when her clothes rub against it.   Current Medications (verified): 1)  Pravachol 40 Mg  Tabs (Pravastatin Sodium) .... Take 1 Tablet By Mouth Once A Day 2)  Symbicort 160-4.5  Mcg/act  Aero (Budesonide-Formoterol Fumarate) .... Inhale 2 Puffs Two Times A Day, Rinse Your Mouth After Using 3)  Aleve 220 Mg  Tabs (Naproxen Sodium) .... As Needed 4)  Proventil Hfa 108 (90 Base) Mcg/act  Aers (Albuterol Sulfate) .... Inhale 2 Puffs Every 4 Hours As Needed 5)  Omeprazole 20 Mg Tbec (Omeprazole) .... Take 1 Tablet By Mouth Two Times A Day 6)  Albuterol Sulfate (2.5 Mg/54ml) 0.083% Nebu (Albuterol Sulfate) .Marland Kitchen.. 1 Nebulized Up To Every 4 Hours If Needed For Shortness of Breath 7)  Flexeril 5 Mg Tabs (Cyclobenzaprine Hcl) .Marland Kitchen.. 1 By Mouth Three Times A Day As Needed 8)  Aspirin 81 Mg  Tabs (Aspirin) .... One Tablet By Mouth Once Daily 9)  Promethazine Vc/codeine 6.25-5-10 Mg/55ml Syrp (Phenyleph-Promethazine-Cod) .Marland Kitchen.. 1-2 Tsp Every 4-6 Hr As Needed Cough  Allergies (verified): 1)  ! Penicillin V Potassium (Penicillin V Potassium)  Vital Signs:  Patient profile:   65 year old female Height:      62 inches (157.48 cm) Weight:      202 pounds (91.82 kg) BMI:     37.08 O2 Sat:      98 % on Room air Temp:     98.3 degrees F (36.83 degrees C) oral Pulse rate:   68 / minute BP sitting:   110 / 72  (right arm) Cuff size:   regular  Vitals Entered By: Michel Bickers CMA (May 20, 2010 9:31 AM)  O2  Sat at Rest %:  98 O2 Flow:  Room air CC: Asthma. VCD. The patient says her brething has improved slightly. C/o occasional wheezing. Denies any cough. Comments Medications reviewed. Daytime phone verified. Michel Bickers CMA  May 20, 2010 9:31 AM   Physical Exam  General:  alert and overweight-appearing.   Head:  normocephalic and atraumatic.   Eyes:  conjunctiva and sclera clear Nose:  no external deformity and no nasal discharge.   Mouth:  posterior pharyngeal erythema Neck:  supple and no masses.  no stridor today Chest Wall:  no deformities noted Lungs:  clear bilaterally, mild referred UA noise Heart:  normal rate, regular rhythm, and no murmur.   Abdomen:  not  examined Extremities:  no clubbing, cyanosis, edema, or deformity noted Neurologic:  Alert and  oriented x4;   Skin:  4mm, round black nevus with regular border Psych:  Alert and cooperative. Normal mood and affect.   Impression & Recommendations:  Problem # 1:  ASTHMA, MILD (ICD-493.90) Doing quite well w regard to asthma and her VCD - continue same regimen  Problem # 2:  VOCAL CORD DISORDER (ICD-478.5)  Orders: Est. Patient Level III (13086)  Problem # 3:  GERD (ICD-530.81)  Her updated medication list for this problem includes:    Omeprazole 20 Mg Tbec (Omeprazole) .Marland Kitchen... Take 1 tablet by mouth two times a day  Problem # 4:  ALLERGIC RHINITIS (ICD-477.9)  Problem # 5:  NEVUS (ICD-216.9)  Will refer her to derm to eval the mole, possible remove for path  Orders: Est. Patient Level III (57846) Dermatology Referral (Derma)  Patient Instructions: 1)  Continue your Symbicort two times a day  2)  We will refer you to be evaluated by a dermatologist  3)  Continue your omeprazole 4)  Follow up with Dr Delton Coombes in 4 months or as needed

## 2011-01-04 NOTE — Assessment & Plan Note (Signed)
Summary: VCD/asthma with exacerbation   Visit Type:  Follow-up Copy to:  Levy Pupa MD Primary Provider/Referring Provider:  Dr Oliver Barre MD  CC:  3 mth rov - Asthma, GERD , and VCD - c/o worse SOB for 1 week - Prod Cough (Yellow-clear) - wheezing - chest tightness.  History of Present Illness: 65 -year-old woman with a history of asthma and vocal cord dysfunction both of which have been significantly impacted by severe esophageal reflux.  Has a hx esophageal stricture dilations by Dr Marina Goodell.  On omeprazole 20mg  two times a day. Uses Symbicort bid.   PSG 12/18/08  - No OSA. In retrospect I believe that her awakenings are due to reflux and esophageal dysmotility.   ROV 10/27/09 -- Returns for follow up today. She had a flare of her AFL 2 weeks ago after she had an esophageal dilation by Dr Marina Goodell (about 3 weeks ago). Treated with burst of pred, now off. Reflux symptoms are fairly well controlled, better since her dilation.   ROV 01/21/10 -- Has been feeling well, then about a week ago began to have cough prod of yellow phlegm. The phlegm is not starting to subside. Evolved SOB with these symptoms. No known sick contacts. No sore throat, hoarse voice. Has heard wheezing in her throat. Taking Symbicort, seems to help.   Current Medications (verified): 1)  Pravachol 40 Mg  Tabs (Pravastatin Sodium) .... Take 1 Tablet By Mouth Once A Day 2)  Symbicort 160-4.5 Mcg/act  Aero (Budesonide-Formoterol Fumarate) .... Inhale 2 Puffs Two Times A Day, Rinse Your Mouth After Using 3)  Aleve 220 Mg  Tabs (Naproxen Sodium) .... As Needed 4)  Proventil Hfa 108 (90 Base) Mcg/act  Aers (Albuterol Sulfate) .... Inhale 2 Puffs Every 4 Hours As Needed 5)  Omeprazole 20 Mg Tbec (Omeprazole) .... Take 1 Tablet By Mouth Two Times A Day 6)  Albuterol Sulfate (2.5 Mg/36ml) 0.083% Nebu (Albuterol Sulfate) .Marland Kitchen.. 1 Nebulized Up To Every 4 Hours If Needed For Shortness of Breath 7)  Flexeril 5 Mg Tabs (Cyclobenzaprine Hcl)  .Marland Kitchen.. 1 By Mouth Three Times A Day As Needed 8)  Aspirin 81 Mg  Tabs (Aspirin) .... One Tablet By Mouth Once Daily 9)  Promethazine Vc/codeine 6.25-5-10 Mg/23ml Syrp (Phenyleph-Promethazine-Cod) .Marland Kitchen.. 1-2 Tsp Every 4-6 Hr As Needed Cough  Allergies (verified): 1)  ! Penicillin V Potassium (Penicillin V Potassium)  Vital Signs:  Patient profile:   65 year old female Height:      62 inches Weight:      206.25 pounds BMI:     37.86 O2 Sat:      99 % on Room air Temp:     97.6 degrees F oral Pulse rate:   103 / minute BP sitting:   100 / 64  (left arm) Cuff size:   regular  Vitals Entered By: Abigail Miyamoto RN (January 21, 2010 10:00 AM)  O2 Flow:  Room air  Physical Exam  General:  alert and overweight-appearing.   Head:  normocephalic and atraumatic.   Eyes:  conjunctiva and sclera clear Nose:  no external deformity and no nasal discharge.   Mouth:  posterior pharyngeal erythema Neck:  supple and no masses.  significant UA noise and stridor with deep breathing, not audible with a normal breath Chest Wall:  no deformities noted Lungs:  referred UA noise, also with bilateral wheezing that is not referred  Heart:  normal rate, regular rhythm, and no murmur.   Abdomen:  not examined  Extremities:  no clubbing, cyanosis, edema, or deformity noted Neurologic:  Alert and  oriented x4;   Psych:  Alert and cooperative. Normal mood and affect.   Impression & Recommendations:  Problem # 1:  ASTHMA, MILD (ICD-493.90) With an apparent exacerbation of both her asthma and more so her VCD. Suspect trigger was URI, her GERD seems to be controlled.  - pred taper - Symbicort - ROV 2 weeks to insure improvement  Problem # 2:  VOCAL CORD DISORDER (ICD-478.5)  This is typically her most significant contributor to her SOB, is very sensitive to GERD and URI's.   Orders: Est. Patient Level III (11914)  Medications Added to Medication List This Visit: 1)  Prednisone 10 Mg Tabs (Prednisone)  .... 40mg  daily x 3days, 30mg  daily x 3days, 20mg  daily x 3days, 10mg  daily x3 days then stop.  Patient Instructions: 1)  Take Prednisone taper as directed 2)  Continue to use your Symbicort two times a day  3)  Continue your omeprazole 4)  Use Proventil 2 puffs as needed  5)  Follow up with either Dr Delton Coombes or T. Parrett, NP, in 10 - 14 days. 6)  Call our office if your symptoms worsen in any way.   Prescriptions: PROVENTIL HFA 108 (90 BASE) MCG/ACT  AERS (ALBUTEROL SULFATE) inhale 2 puffs every 4 hours as needed  #1 x 11   Entered and Authorized by:   Leslye Peer MD   Signed by:   Leslye Peer MD on 01/21/2010   Method used:   Electronically to        [facility] [phone]* (retail)       [address]       [place], [place]  [address]       Ph: [phone]       Fax: [phone]   RxID:   [id number] PREDNISONE 10 MG TABS (PREDNISONE) 40mg  daily x 3days, 30mg  daily x 3days, 20mg  daily x 3days, 10mg  daily x3 days then stop.  #30 x 0   Entered and Authorized by:   Leslye Peer MD   Signed by:   Leslye Peer MD on 01/21/2010   Method used:   Electronically to        [facility] [phone]* (retail)       [address]       [place], [place]  [address]       Ph: [phone]       Fax: [phone]   RxID:   [id number]

## 2011-01-04 NOTE — Assessment & Plan Note (Signed)
Summary: VCD, asthma   Visit Type:  Follow-up Copy to:  Levy Pupa MD Primary Provider/Referring Provider:  Dr Oliver Barre MD  CC:  Asthma...VCD...the patient has no breathing complaints today...no changes better or worse.  History of Present Illness: 65 -year-old woman with a history of asthma and vocal cord dysfunction both of which have been significantly impacted by severe esophageal reflux.  Has a hx esophageal stricture dilations by Dr Marina Goodell.  On omeprazole 20mg  two times a day. Uses Symbicort bid.   ROV 01/21/10 -- Has been feeling well, then about a week ago began to have cough prod of yellow phlegm. The phlegm is not starting to subside. Evolved SOB with these symptoms. No known sick contacts. No sore throat, hoarse voice. Has heard wheezing in her throat. Taking Symbicort, seems to help.   ROV 02/03/10 -- Feels much better since last time. Still with some cough, rare UA noise. The prednisone helped, reflux may have suffered from being on the pred. Taking Symbicort reliably, not bothering her throat. Good exertional tolerance.   ROV 05/20/10 -- regular f/u for VCD and asthma. No problems since last visit. Has had good control of both her GERD and UA irritation right now. Takes Symbicort reliably. Has been walking, has lost 9 lbs since last visit. Mentions today that she has a nevus on her chest that has become painful when her clothes rub against it.   ROV 09/07/10 -- f/u for allergies, GERD, VCD, asthma. Gained back 4 lbs. For the last 3 weeks more trouble with SOB and UA wheezing. Using SABA 1x a day. Taking Symbicort two times a day. She was recently started on clonidine in August, she questions whether this is a factor in her SOB. Occas globus sensation. Freq cough. No real heartburn symptoms. Occas gets cough and irritation from using symbicort.   ROV 10/08/10 -- allergies, GERD, VCD > asthma. Returns for f/u. last time we stopped clonidine to see if it would help her breathing - no  change. Has continued cough, UA wheezing, throat clearing, tickle in her throat. Swallowing is ok. She has globus sensation, but doesn't clear phlegm. Reliable about her omeprazole two times a day.   Current Medications (verified): 1)  Pravachol 40 Mg  Tabs (Pravastatin Sodium) .... Take 1 Tablet By Mouth Once A Day 2)  Symbicort 160-4.5 Mcg/act  Aero (Budesonide-Formoterol Fumarate) .... Inhale 2 Puffs Two Times A Day, Rinse Your Mouth After Using 3)  Aleve 220 Mg  Tabs (Naproxen Sodium) .... As Needed 4)  Proventil Hfa 108 (90 Base) Mcg/act  Aers (Albuterol Sulfate) .... Inhale 2 Puffs Every 4 Hours As Needed 5)  Omeprazole 20 Mg Tbec (Omeprazole) .... Take 1 Tablet By Mouth Two Times A Day 6)  Albuterol Sulfate (2.5 Mg/31ml) 0.083% Nebu (Albuterol Sulfate) .Marland Kitchen.. 1 Nebulized Up To Every 4 Hours If Needed For Shortness of Breath 7)  Flexeril 5 Mg Tabs (Cyclobenzaprine Hcl) .Marland Kitchen.. 1 By Mouth Three Times A Day As Needed 8)  Aspirin 81 Mg  Tabs (Aspirin) .... One Tablet By Mouth Once Daily 9)  Clonidine Hcl 0.1 Mg Tabs (Clonidine Hcl) .Marland Kitchen.. 1 By Mouth At Bedtime  Allergies (verified): 1)  ! Penicillin V Potassium (Penicillin V Potassium)  Vital Signs:  Patient profile:   65 year old female Height:      62 inches (157.48 cm) Weight:      210 pounds (95.45 kg) BMI:     38.55 O2 Sat:      98 %  on Room air Temp:     98.0 degrees F (36.67 degrees C) oral Pulse rate:   82 / minute BP sitting:   118 / 80  (left arm) Cuff size:   large  Vitals Entered By: Michel Bickers CMA (October 08, 2010 1:29 PM)  O2 Sat at Rest %:  98 O2 Flow:  Room air CC: Asthma...VCD...the patient has no breathing complaints today...no changes better or worse Comments Medications reviewed with patient Michel Bickers CMA  October 08, 2010 1:30 PM   Physical Exam  General:  alert and overweight-appearing.   Head:  normocephalic and atraumatic.   Eyes:  conjunctiva and sclera clear Nose:  no external deformity and no  nasal discharge.   Mouth:  posterior pharyngeal erythema Neck:  supple and no masses. + stridor today Chest Wall:  no deformities noted Lungs:  clear bilaterally, + referred UA noise Heart:  normal rate, regular rhythm, and no murmur.   Abdomen:  not examined Extremities:  no clubbing, cyanosis, edema, or deformity noted Neurologic:  Alert and  oriented x4;   Skin:  4mm, round black nevus with regular border Psych:  Alert and cooperative. Normal mood and affect.   Medications Added to Medication List This Visit: 1)  Fluticasone Propionate 50 Mcg/act Susp (Fluticasone propionate) .... 2 sprays each nostril two times a day 2)  Loratadine 10 Mg Tabs (Loratadine) .Marland Kitchen.. 1 by mouth once daily  Other Orders: Misc. Referral (Misc. Ref) Est. Patient Level IV (16109)  Patient Instructions: 1)  Continue your omeparzole 20mg  two times a day  2)  Continue your Symbicort two times a day for now. We may consider taking a break from this medication to see if it will make your throat symptoms better 3)  Add loratadine 10mg  by mouth once daily  4)  Add flutoicasone nasal spray 2 sprays each nostril two times a day  5)  Follow up with Dr Delton Coombes in 6 weeks or as needed  Prescriptions: LORATADINE 10 MG TABS (LORATADINE) 1 by mouth once daily  #30 x 5   Entered and Authorized by:   Leslye Peer MD   Signed by:   Leslye Peer MD on 10/08/2010   Method used:   Electronically to        [facility] [phone]* (retail)       [address]       [place], [place]  [address]       Ph: [phone]       Fax: [phone]   RxID:   [id number] FLUTICASONE PROPIONATE 50 MCG/ACT SUSP (FLUTICASONE PROPIONATE) 2 sprays each nostril two times a day  #1 x 5   Entered and Authorized by:   Leslye Peer MD   Signed by:   Leslye Peer MD on 10/08/2010   Method used:   Electronically to        [facility] [phone]* (retail)       [address]       [place], [place]  [address]       Ph:  [phone]       Fax: [phone]   RxID:   [id number]

## 2011-01-04 NOTE — Letter (Signed)
Summary: [facility]  [facility]   Imported By: Lester Goldthwaite 05/20/2010 09:24:58  _____________________________________________________________________  External Attachment:    Type:   Image     Comment:   External Document

## 2011-01-04 NOTE — Assessment & Plan Note (Signed)
Summary: VCD, asthma, GERD   Visit Type:  Follow-up Copy to:  Levy Pupa MD Primary Provider/Referring Provider:  Dr Oliver Barre MD  CC:  10 day follow up.  states she feels a lot better.  states SOB and chest tightnes are back to baseline but still coughing a little-prod with a small amount of clear mucus and occasional wheezing.[name]  History of Present Illness: 65 -year-old woman with a history of asthma and vocal cord dysfunction both of which have been significantly impacted by severe esophageal reflux.  Has a hx esophageal stricture dilations by Dr Marina Goodell.  On omeprazole 20mg  two times a day. Uses Symbicort bid.   PSG 12/18/08  - No OSA. In retrospect I believe that her awakenings are due to reflux and esophageal dysmotility.   ROV 10/27/09 -- Returns for follow up today. She had a flare of her AFL 2 weeks ago after she had an esophageal dilation by Dr Marina Goodell (about 3 weeks ago). Treated with burst of pred, now off. Reflux symptoms are fairly well controlled, better since her dilation.   ROV 01/21/10 -- Has been feeling well, then about a week ago began to have cough prod of yellow phlegm. The phlegm is not starting to subside. Evolved SOB with these symptoms. No known sick contacts. No sore throat, hoarse voice. Has heard wheezing in her throat. Taking Symbicort, seems to help.   ROV 02/03/10 -- Feels much better since last time. Still with some cough, rare UA noise. The prednisone helped, reflux may have suffered from being on the pred. Taking Symbicort reliably, not bothering her throat. Good exertional tolerance.   Current Medications (verified): 1)  Pravachol 40 Mg  Tabs (Pravastatin Sodium) .... Take 1 Tablet By Mouth Once A Day 2)  Symbicort 160-4.5 Mcg/act  Aero (Budesonide-Formoterol Fumarate) .... Inhale 2 Puffs Two Times A Day, Rinse Your Mouth After Using 3)  Aleve 220 Mg  Tabs (Naproxen Sodium) .... As Needed 4)  Proventil Hfa 108 (90 Base) Mcg/act  Aers (Albuterol Sulfate) ....  Inhale 2 Puffs Every 4 Hours As Needed 5)  Omeprazole 20 Mg Tbec (Omeprazole) .... Take 1 Tablet By Mouth Two Times A Day 6)  Albuterol Sulfate (2.5 Mg/31ml) 0.083% Nebu (Albuterol Sulfate) .[name].. 1 Nebulized Up To Every 4 Hours If Needed For Shortness of Breath 7)  Flexeril 5 Mg Tabs (Cyclobenzaprine Hcl) .[name].. 1 By Mouth Three Times A Day As Needed 8)  Aspirin 81 Mg  Tabs (Aspirin) .... One Tablet By Mouth Once Daily 9)  Promethazine Vc/codeine 6.25-5-10 Mg/49ml Syrp (Phenyleph-Promethazine-Cod) .[name].. 1-2 Tsp Every 4-6 Hr As Needed Cough  Allergies (verified): 1)  ! Penicillin V Potassium (Penicillin V Potassium)  Vital Signs:  Patient profile:   65 year old female Height:      62 inches Weight:      211.25 pounds BMI:     38.78 O2 Sat:      97 % on Room air Temp:     98.2 degrees F oral Pulse rate:   90 / minute BP sitting:   120 / 72  (left arm) Cuff size:   regular  Vitals Entered By: Gweneth Dimitri RN (February 03, 2010 3:25 PM)  O2 Flow:  Room air CC: 10 day follow up.  states she feels a lot better.  states SOB and chest tightnes are back to baseline but still coughing a little-prod with a small amount of clear mucus and occasional wheezing. Comments Medications reviewed with patient Daytime contact  number verified with patient. Gweneth Dimitri RN  February 03, 2010 3:26 PM    Physical Exam  General:  alert and overweight-appearing.   Head:  normocephalic and atraumatic.   Eyes:  conjunctiva and sclera clear Nose:  no external deformity and no nasal discharge.   Mouth:  posterior pharyngeal erythema Neck:  supple and no masses.  no stridor today Chest Wall:  no deformities noted Lungs:  clear bilaterally, much improved from last visit Heart:  normal rate, regular rhythm, and no murmur.   Abdomen:  not examined Extremities:  no clubbing, cyanosis, edema, or deformity noted Neurologic:  Alert and  oriented x4;   Psych:  Alert and cooperative. Normal mood and  affect.   Impression & Recommendations:  Problem # 1:  ASTHMA (ICD-493.90) Improved after rx acute exacerbation.  - Symbicort two times a day  - I cleared her to start exercise routine, as long as she keeps her Proventil available  Problem # 2:  VOCAL CORD DISORDER (ICD-478.5)  Stable. Need to insure adequate GERD control. Sounds like the prednisone taper may have worsened the GERD. Hopefully this will be transient. Continue omeprazole for now but consider ramping up therapy if this remains problematic  Orders: Est. Patient Level III (16109)  Patient Instructions: 1)  Continue your Symbicort two times a day  2)  Continue your omeprazole two times a day. If you continue to have reflux symptoms, notify either Dr Jonny Ruiz or Dr Delton Coombes to consider adjusting your meds.  3)  You are cleared to start exercising at the [facility]. Please take your Proventil with you whenever you work out.  4)  Follow up with Dr Delton Coombes in 4 months or as needed.

## 2011-01-06 NOTE — Assessment & Plan Note (Signed)
Summary: VCD, asthma   Visit Type:  Follow-up Copy to:  Levy Pupa MD Primary Provider/Referring Provider:  Dr Oliver Barre MD  CC:  VCD...asthma.Marland KitchenMarland Kitchenpt has no breathing complaints today...she does c/o occ dizziness at times when standing from a sitting position or sitting still.  History of Present Illness: 65 -year-old woman with a history of asthma and vocal cord dysfunction both of which have been significantly impacted by severe esophageal reflux.  Has a hx esophageal stricture dilations by Dr Marina Goodell.  On omeprazole 20mg  two times a day. Uses Symbicort bid.   ROV 05/20/10 -- regular f/u for VCD and asthma. No problems since last visit. Has had good control of both her GERD and UA irritation right now. Takes Symbicort reliably. Has been walking, has lost 9 lbs since last visit. Mentions today that she has a nevus on her chest that has become painful when her clothes rub against it.   ROV 09/07/10 -- f/u for allergies, GERD, VCD, asthma. Gained back 4 lbs. For the last 3 weeks more trouble with SOB and UA wheezing. Using SABA 1x a day. Taking Symbicort two times a day. She was recently started on clonidine in August, she questions whether this is a factor in her SOB. Occas globus sensation. Freq cough. No real heartburn symptoms. Occas gets cough and irritation from using symbicort.   ROV 10/08/10 -- allergies, GERD, VCD > asthma. Returns for f/u. last time we stopped clonidine to see if it would help her breathing - no change. Has continued cough, UA wheezing, throat clearing, tickle in her throat. Swallowing is ok. She has globus sensation, but doesn't clear phlegm. Reliable about her omeprazole two times a day.   11/18/10 -- VCD, GERD, allergies, probable coexisting asthma. Returns for f/u. Last time restarted loratadine, fluticasone. She was already on omeprazole. Her breathing is better. Today she mentions some dizziness, no syncope, can happen with sitting or standing. The room spins. No CP, no  dyspnea, no palpitations. Remains off clonidine, stopped in October to see if it would help breathing. BP is OK (she was on it for sweats).   Current Medications (verified): 1)  Pravachol 40 Mg  Tabs (Pravastatin Sodium) .... Take 1 Tablet By Mouth Once A Day 2)  Symbicort 160-4.5 Mcg/act  Aero (Budesonide-Formoterol Fumarate) .... Inhale 2 Puffs Two Times A Day, Rinse Your Mouth After Using 3)  Aleve 220 Mg  Tabs (Naproxen Sodium) .... As Needed 4)  Proventil Hfa 108 (90 Base) Mcg/act  Aers (Albuterol Sulfate) .... Inhale 2 Puffs Every 4 Hours As Needed 5)  Omeprazole 20 Mg Tbec (Omeprazole) .... Take 1 Tablet By Mouth Two Times A Day 6)  Albuterol Sulfate (2.5 Mg/80ml) 0.083% Nebu (Albuterol Sulfate) .Marland Kitchen.. 1 Nebulized Up To Every 4 Hours If Needed For Shortness of Breath 7)  Flexeril 5 Mg Tabs (Cyclobenzaprine Hcl) .Marland Kitchen.. 1 By Mouth Three Times A Day As Needed 8)  Aspirin 81 Mg  Tabs (Aspirin) .... One Tablet By Mouth Once Daily 9)  Fluticasone Propionate 50 Mcg/act Susp (Fluticasone Propionate) .... 2 Sprays Each Nostril Two Times A Day 10)  Loratadine 10 Mg Tabs (Loratadine) .Marland Kitchen.. 1 By Mouth Once Daily  Allergies (verified): 1)  ! Penicillin V Potassium (Penicillin V Potassium)  Vital Signs:  Patient profile:   65 year old female Height:      62 inches (157.48 cm) Weight:      206 pounds (93.64 kg) BMI:     37.81 O2 Sat:  94 % on Room air Temp:     98.5 degrees F (36.94 degrees C) oral Pulse rate:   97 / minute BP supine:   116 / 78  (right arm) BP sitting:   110 / 70  (right arm) BP standing:   120 / 74  (right arm) Cuff size:   large  Vitals Entered By: Michel Bickers CMA (November 18, 2010 1:35 PM)  O2 Sat at Rest %:  94 O2 Flow:  Room air CC: VCD...asthma.Marland KitchenMarland Kitchenpt has no breathing complaints today...she does c/o occ dizziness at times when standing from a sitting position or sitting still Comments Medications reviewed with patient Michel Bickers CMA  November 18, 2010 1:42  PM   Physical Exam  General:  alert and overweight-appearing.   Head:  normocephalic and atraumatic.   Eyes:  conjunctiva and sclera clear Nose:  no external deformity and no nasal discharge.   Mouth:  posterior pharyngeal erythema Neck:  supple and no masses. no stridor today Chest Wall:  no deformities noted Lungs:  clear bilaterally, no referred noise today Heart:  normal rate, regular rhythm, and no murmur.   Abdomen:  not examined Extremities:  no clubbing, cyanosis, edema, or deformity noted Neurologic:  Alert and  oriented x4;   Skin:  4mm, round black nevus with regular border Psych:  Alert and cooperative. Normal mood and affect.   Impression & Recommendations:  Problem # 1:  VOCAL CORD DISORDER (ICD-478.5)  Improved off clonidine, on allergy regimen and omeprazole  Orders: Est. Patient Level IV (16109)  Problem # 2:  ASTHMA, MILD (ICD-493.90) Symbicort two times a day   Problem # 3:  DIZZINESS (ICD-780.4)  check orthostatics today If continues to be a problem will ask her to go discuss w Dr Jonny Ruiz  Orders: Est. Patient Level IV (60454)  Patient Instructions: 1)  Continue your Symbicort two times a day 2)  Use your loratadine, omeprazole and fluticasone spray 3)  Notify Dr Jonny Ruiz or Dr Delton Coombes if you continue to have dizziness. Your blood pressure today was good when you stand up. 4)  Follow up with Dr Delton Coombes in 6 months or as needed

## 2011-01-06 NOTE — Medication Information (Signed)
Summary: Tax adviser   Imported By: Valinda Hoar 11/26/2010 11:37:32  _____________________________________________________________________  External Attachment:    Type:   Image     Comment:   External Document

## 2011-01-06 NOTE — Progress Notes (Signed)
Summary: PA for Omeprazole initiated > APPROVED thru 12.21.12  Phone Note Call from Patient Call back at Home Phone [phone]   Caller: Patient Call For: BYRUM Summary of Call: NEEDS A REFILL ON HER ACID REFLUX MEDS [facility] [address] Initial call taken by: Lacinda Axon,  November 22, 2010 10:23 AM  Follow-up for Phone Call        Omeprazole needs prior auth.  Prio auth initiated through [employer].  Case ID# [id number].  Awaiting form. Abigail Miyamoto RN  November 22, 2010 12:04 PM   Form received and given to [facility] for Dr Kavin Leech review. Abigail Miyamoto RN  November 22, 2010 12:30 PM  Form signed by [facility] and faxed back to insurance. Awaiting response on coverage.  Follow-up by: Michel Bickers CMA,  November 25, 2010 4:47 PM  Additional Follow-up for Phone Call Additional follow up Details #1::        PA approved from 11-04-10 through 11-25-2011; [facility] pharmacy about this but was closed, reopen at 9am-try to call back later. Reynaldo Minium CMA  November 26, 2010 8:49 AM   called [facility], spoke with pharmacist and advised of approved PA for the omeprazole. Boone Master CNA/MA  November 26, 2010 9:07 AM     Prescriptions: OMEPRAZOLE 20 MG TBEC (OMEPRAZOLE) Take 1 tablet by mouth two times a day  #60 Each x 5   Entered by:   Abigail Miyamoto RN   Authorized by:   Leslye Peer MD   Signed by:   Abigail Miyamoto RN on 11/22/2010   Method used:   Electronically to        [facility] [id number]* (retail)       [address]       [place], [place]  [address]       Ph: [phone]       Fax: [phone]   RxID:   [id number]

## 2011-01-06 NOTE — Progress Notes (Signed)
Summary: omeprazole PA > advised to use OTC until approved  Phone Note Call from Patient Call back at Home Phone [phone]   Caller: Patient Call For: dr bryum Summary of Call: Patient has been trying to get her acid reflux medicine since last Friday stated that the pharmacy told her it needed autorization. She has called our office and the pharmacy and still has not received a call back she can be reached at [phone] her pharmacy is [facility] on [address] she did not have the number. She would like for someone to call her please at her home number Initial call taken by: Vedia Coffer,  November 24, 2010 12:02 PM  Follow-up for Phone Call        per 12.19.11 phone note, omeprazole needs PA.  form recieved and placed in RB's folder to fill out / sign.  called spoke with patient, advised of PA process and that we are working on this for her.  advised pt to try OTC therapies to hold her until this is taken care of.  pt okay with this and verbalized her understanding. Boone Master CNA/MA  November 24, 2010 3:23 PM

## 2011-01-11 ENCOUNTER — Encounter (INDEPENDENT_AMBULATORY_CARE_PROVIDER_SITE_OTHER): Payer: Self-pay | Admitting: *Deleted

## 2011-01-11 ENCOUNTER — Other Ambulatory Visit: Payer: Self-pay | Admitting: Internal Medicine

## 2011-01-11 ENCOUNTER — Encounter: Payer: Self-pay | Admitting: Internal Medicine

## 2011-01-11 ENCOUNTER — Ambulatory Visit (INDEPENDENT_AMBULATORY_CARE_PROVIDER_SITE_OTHER): Payer: Medicare Other | Admitting: Internal Medicine

## 2011-01-11 ENCOUNTER — Other Ambulatory Visit: Payer: Medicare Other

## 2011-01-11 DIAGNOSIS — R1319 Other dysphagia: Secondary | ICD-10-CM

## 2011-01-11 DIAGNOSIS — E119 Type 2 diabetes mellitus without complications: Secondary | ICD-10-CM

## 2011-01-11 DIAGNOSIS — M6530 Trigger finger, unspecified finger: Secondary | ICD-10-CM

## 2011-01-11 DIAGNOSIS — R350 Frequency of micturition: Secondary | ICD-10-CM | POA: Insufficient documentation

## 2011-01-11 LAB — URINALYSIS, ROUTINE W REFLEX MICROSCOPIC
Bilirubin Urine: NEGATIVE
Leukocytes, UA: NEGATIVE
Nitrite: NEGATIVE
Urobilinogen, UA: 0.2 (ref 0.0–1.0)

## 2011-01-11 LAB — BASIC METABOLIC PANEL
BUN: 15 mg/dL (ref 6–23)
CO2: 28 mEq/L (ref 19–32)
Calcium: 9.1 mg/dL (ref 8.4–10.5)
Creatinine, Ser: 0.8 mg/dL (ref 0.4–1.2)
GFR: 91.06 mL/min (ref >60.00)
Glucose, Bld: 82 mg/dL (ref 70–99)
Potassium: 4.4 mEq/L (ref 3.5–5.1)
Sodium: 142 mEq/L (ref 135–145)

## 2011-01-11 LAB — HEMOGLOBIN A1C: Hgb A1c MFr Bld: 6.1 % (ref 4.6–6.5)
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
## 2011-01-31 ENCOUNTER — Encounter: Payer: Self-pay | Admitting: Internal Medicine

## 2011-02-10 ENCOUNTER — Ambulatory Visit (INDEPENDENT_AMBULATORY_CARE_PROVIDER_SITE_OTHER): Payer: Medicare Other | Admitting: Internal Medicine

## 2011-02-10 ENCOUNTER — Encounter: Payer: Self-pay | Admitting: Internal Medicine

## 2011-02-10 DIAGNOSIS — K219 Gastro-esophageal reflux disease without esophagitis: Secondary | ICD-10-CM

## 2011-02-10 DIAGNOSIS — R1310 Dysphagia, unspecified: Secondary | ICD-10-CM

## 2011-02-10 DIAGNOSIS — K222 Esophageal obstruction: Secondary | ICD-10-CM

## 2011-02-15 NOTE — Letter (Signed)
Summary: EGD Instructions  [facility]  [address] [place], [place] [address]   Phone: [phone]  Fax: [phone]       [name]    1946-09-01    MRN: [id number]       Procedure Day /Date:TUESDAY, 03/01/11     Arrival Time:8:15 AM     Procedure Time:9:15 AM     Location of Procedure:                     [facility] ( Outpatient Registration)   PREPARATION FOR ENDOSCOPY/DIL C-ARM   On TUESDAY 03/01/11 THE DAY OF THE PROCEDURE:  1.   No solid foods, milk or milk products are allowed after midnight the night before your procedure.  2.   Do not drink anything colored red or purple.  Avoid juices with pulp.  No orange juice.  3.  You may drink clear liquids until 5:15 AM,, which is 4 hours before your procedure.                                                                                                CLEAR LIQUIDS INCLUDE: Water Jello Ice Popsicles Tea (sugar ok, no milk/cream) Powdered fruit flavored drinks Coffee (sugar ok, no milk/cream) Gatorade Juice: apple, white grape, white cranberry  Lemonade Clear bullion, consomm, broth Carbonated beverages (any kind) Strained chicken noodle soup Hard Candy   MEDICATION INSTRUCTIONS  Unless otherwise instructed, you should take regular prescription medications with a small sip of water as early as possible the morning of your procedure.           OTHER INSTRUCTIONS  You will need a responsible adult at least 65 years of age to accompany you and drive you home.   This person must remain in the waiting room during your procedure.  Wear loose fitting clothing that is easily removed.  Leave jewelry and other valuables at home.  However, you may wish to bring a book to read or an iPod/MP3 player to listen to music as you wait for your procedure to start.  Remove all body piercing jewelry and leave at home.  Total time from sign-in until discharge is approximately 2-3  hours.  You should go home directly after your procedure and rest.  You can resume normal activities the day after your procedure.  The day of your procedure you should not:   Drive   Make legal decisions   Operate machinery   Drink alcohol   Return to work  You will receive specific instructions about eating, activities and medications before you leave.    The above instructions have been reviewed and explained to me by   _______________________    I fully understand and can verbalize these instructions _____________________________ Date _________

## 2011-02-15 NOTE — Assessment & Plan Note (Signed)
Summary: Dysphagia    History of Present Illness Visit Type: Initial Consult Primary GI MD: Yancey Flemings MD Primary Provider: Oliver Barre MD Requesting Provider: Oliver Barre MD Chief Complaint: Dysphagia with solids and liquids  History of Present Illness:    65 year old female with multiple medical problems including asthma, severe upper airway irritation with vocal cord dysfunction, GERD complicated by peptic stricture that has required repeat esophageal dilation, hyperlipidemia, osteoarthritis, we'll close intolerance , and adenomatous colon polyps. she presents today with a chief complaint of liquid and solid food dysphagia over the past 2 months. similar problems at the time of her last office visit in November of 2010.Marland Kitchen She underwent upper endoscopy with esophageal dilation to a maximal diameter 54 [place] that same month. Benign stricture noted. She states that this helped. She states that her current problems are similar to her prior swallowing issues. 4 GERD she stays on omeprazole 20 mg daily. No problems with pyrosis , though occasional regurgitation. other general medical problems are stable. laboratories in February 2012 unremarkable. Last colonoscopy in 2008 negative for adenomatous. Followup July 2013 recommended   GI Review of Systems    Reports dysphagia with liquids and  dysphagia with solids.      Denies abdominal pain, acid reflux, belching, bloating, chest pain, heartburn, loss of appetite, nausea, vomiting, vomiting blood, weight loss, and  weight gain.        Denies anal fissure, black tarry stools, change in bowel habit, constipation, diarrhea, diverticulosis, fecal incontinence, heme positive stool, hemorrhoids, irritable bowel syndrome, jaundice, light color stool, liver problems, rectal bleeding, and  rectal pain.    Current Medications (verified): 1)  Pravachol 40 Mg  Tabs (Pravastatin Sodium) .... Take 1 Tablet By Mouth Once A Day 2)  Symbicort 160-4.5 Mcg/act  Aero  (Budesonide-Formoterol Fumarate) .... Inhale 2 Puffs Two Times A Day, Rinse Your Mouth After Using 3)  Aleve 220 Mg  Tabs (Naproxen Sodium) .... As Needed 4)  Proventil Hfa 108 (90 Base) Mcg/act  Aers (Albuterol Sulfate) .... Inhale 2 Puffs Every 4 Hours As Needed 5)  Omeprazole 20 Mg Tbec (Omeprazole) .... Take 1 Tablet By Mouth Two Times A Day 6)  Albuterol Sulfate (2.5 Mg/63ml) 0.083% Nebu (Albuterol Sulfate) .Marland Kitchen.. 1 Nebulized Up To Every 4 Hours If Needed For Shortness of Breath 7)  Flexeril 5 Mg Tabs (Cyclobenzaprine Hcl) .Marland Kitchen.. 1 By Mouth Three Times A Day As Needed 8)  Aspirin 81 Mg  Tabs (Aspirin) .... One Tablet By Mouth Once Daily 9)  Fluticasone Propionate 50 Mcg/act Susp (Fluticasone Propionate) .... 2 Sprays Each Nostril Two Times A Day 10)  Allegra Allergy 180 Mg Tabs (Fexofenadine Hcl) .Marland Kitchen.. 1 By Mouth Once Daily As Needed Allergies 11)  Vesicare 5 Mg Tabs (Solifenacin Succinate) .Marland Kitchen.. 1po Once Daily  Allergies (verified): 1)  ! Penicillin V Potassium (Penicillin V Potassium)  Past History:  Past Medical History: Allergic rhinitis Asthma GERD Hyperlipidemia vocal cord dysfunction esophageal stricture Diabetes mellitus, type II - diet/with steroids Colonic polyps, hx of Osteoporosis c-spine disc disease bilat knee DJD hiatal hernia  hypertension   MD rosterL  Dr Delton Coombes - pulm                     Dr Marina Goodell - GI                    Derm -  Dr Danella Deis  optho  - can't remember                     GYN - Dr Darrick Penna                     ortho - Dr Lequita Halt  Past Surgical History: Reviewed history from 06/30/2008 and no changes required. Carpal tunnel release - bilat Hysterectomy  Family History: cancer-sister heart disease-mother heart attack asthma-brother and uncle Family History of Colon Cancer: Father   Social History: Retired Never Smoked Alcohol use-no Daily Caffeine Use 2 per day Illicit Drug Use - no Patient gets regular exercise. Drug  use-no  Review of Systems       The patient complains of allergy/sinus and sleeping problems.  The patient denies anemia, anxiety-new, arthritis/joint pain, back pain, blood in urine, breast changes/lumps, change in vision, confusion, cough, coughing up blood, depression-new, fainting, fatigue, fever, headaches-new, hearing problems, heart murmur, heart rhythm changes, itching, menstrual pain, muscle pains/cramps, night sweats, nosebleeds, pregnancy symptoms, shortness of breath, skin rash, sore throat, swelling of feet/legs, swollen lymph glands, thirst - excessive , urination - excessive , urination changes/pain, urine leakage, vision changes, and voice change.    Vital Signs:  Patient profile:   65 year old female Height:      62 inches Weight:      209 pounds BMI:     38.36 BSA:     1.95 Pulse rate:   68 / minute Pulse rhythm:   regular BP sitting:   132 / 80  (left arm) Cuff size:   large  Vitals Entered By: Ok Anis CMA (February 10, 2011 10:14 AM)  Physical Exam  General:  Well developed, well nourished, no acute distress. Head:  Normocephalic and atraumatic. Eyes:  PERRLA, no icterus. Mouth:  No deformity or lesions Neck:  Supple; no masses or thyromegaly. Lungs:  Clear throughout to auscultation. Heart:  Regular rate and rhythm; no murmurs, rubs,  or bruits. Abdomen:  Soft,  obese,nontender and nondistended. No masses, hepatosplenomegaly or hernias noted. Normal bowel sounds. Pulses:  Normal pulses noted. Extremities:   no edema Neurologic:  Alert and  oriented x4;. Skin:  Intact without significant lesions or rashes. Psych:  Alert and cooperative. Normal mood and affect.   Impression & Recommendations:  Problem # 1:  OTHER DYSPHAGIA (ICD-30.75)  65 year old female with GERD complicated by peptic stricture. Now with a 2 month history of recurrent dysphagia, likely due to recurrent stricture.  Plan  #1. Upper endoscopy with esophageal dilation. the nature of the  procedure as well as the risks, benefits, and alternatives were reviewed. she understood and agreed to proceed.  #2. continue PPI  Problem # 2:  GERD, SEVERE (ICD-530.81)  continue PPI and reflux precautions  Problem # 3:  COLONIC POLYPS, HX OF (ICD-V12.72)  adenomatous colon polyps as well as a family history of colon cancer. Surveillance up to date. Due for followup around July 2013  Other Orders: ZENDO with Andres Labrum. Dil (ZENDO/Sav Dil.)  Patient Instructions: 1)  EGD with Dil [facility] 03/01/11 9:15 am arrive at 8:15 am go to outpatient registration. 2)  Upper Endoscopy brochure given.  3)  Booking # [id number] 4)  The medication list was reviewed and reconciled.  All changed / newly prescribed medications were explained.  A complete medication list was provided to the patient / caregiver. 5)  Copy sent to : Oliver Barre MD

## 2011-03-01 ENCOUNTER — Encounter: Payer: Medicare Other | Admitting: Internal Medicine

## 2011-03-01 ENCOUNTER — Ambulatory Visit (HOSPITAL_COMMUNITY): Payer: Medicare Other

## 2011-03-01 ENCOUNTER — Ambulatory Visit (HOSPITAL_COMMUNITY)
Admission: RE | Admit: 2011-03-01 | Discharge: 2011-03-01 | Disposition: A | Discharge status: Home / Self Care | Payer: Medicare Other | Source: Ambulatory Visit | Attending: Internal Medicine | Admitting: Internal Medicine

## 2011-03-01 ENCOUNTER — Encounter: Payer: Self-pay | Admitting: Internal Medicine

## 2011-03-01 DIAGNOSIS — E119 Type 2 diabetes mellitus without complications: Secondary | ICD-10-CM | POA: Insufficient documentation

## 2011-03-01 DIAGNOSIS — K219 Gastro-esophageal reflux disease without esophagitis: Secondary | ICD-10-CM

## 2011-03-01 DIAGNOSIS — R1310 Dysphagia, unspecified: Secondary | ICD-10-CM | POA: Insufficient documentation

## 2011-03-01 DIAGNOSIS — K449 Diaphragmatic hernia without obstruction or gangrene: Secondary | ICD-10-CM

## 2011-03-01 DIAGNOSIS — Z01812 Encounter for preprocedural laboratory examination: Secondary | ICD-10-CM | POA: Insufficient documentation

## 2011-03-01 DIAGNOSIS — M810 Age-related osteoporosis without current pathological fracture: Secondary | ICD-10-CM | POA: Insufficient documentation

## 2011-03-01 DIAGNOSIS — Z79899 Other long term (current) drug therapy: Secondary | ICD-10-CM | POA: Insufficient documentation

## 2011-03-01 DIAGNOSIS — I10 Essential (primary) hypertension: Secondary | ICD-10-CM | POA: Insufficient documentation

## 2011-03-01 DIAGNOSIS — Z7982 Long term (current) use of aspirin: Secondary | ICD-10-CM | POA: Insufficient documentation

## 2011-03-01 LAB — GLUCOSE, CAPILLARY: Glucose-Capillary: 98 mg/dL (ref 70–99)

## 2011-03-08 NOTE — Procedures (Signed)
Summary: Upper Endoscopy  Patient: [name] Note: All result statuses are Final unless otherwise noted.  Tests: (1) Upper Endoscopy (EGD)   EGD Upper Endoscopy       DONE     [facility]     [address] [place], [place]  [address]          ENDOSCOPY PROCEDURE REPORT          PATIENT:  [name], [name]  MR#:  [id number]     BIRTHDATE:  October 20, 1946, 64 yrs. old  GENDER:  female          ENDOSCOPIST:  Wilhemina Bonito. Eda Keys, MD     Referred by:  Office          PROCEDURE DATE:  03/01/2011     PROCEDURE:  EGD with dilatation over guidewire - 18mm     Fluoroscopy assisted esophageal     dilation     ASA CLASS:  Class III     INDICATIONS:  dysphagia          MEDICATIONS:   Fentanyl 75 mcg IV, Versed 7 mg IV     TOPICAL ANESTHETIC:  Cetacaine Spray          DESCRIPTION OF PROCEDURE:   After the risks benefits and     alternatives of the procedure were thoroughly explained, informed     consent was obtained.  The Pentax Gastroscope [id number] endoscope     was introduced through the mouth and advanced to the second     portion of the duodenum, without limitations.  The instrument was     slowly withdrawn as the mucosa was fully examined.     <<PROCEDUREIMAGES>>          The upper, middle, and distal third of the esophagus were     carefully inspected and no abnormalities were noted. The z-line     was well seen at the GEJ. The endoscope was pushed into the fundus     which was normal including a retroflexed view. The antrum,gastric     body, first and second part of the duodenum were unremarkable.  A     3cm hiatal hernia was found.    Retroflexed views revealed the     hiatal hernia.          Therapy: Savary guidewire placed in antrum under fluoroscopic     control. Via Fluoro, 18mm dilator passed over wire w/o resistance     or heme. Tolerated well          COMPLICATIONS:  None     ENDOSCOPIC IMPRESSION:     1) Normal EGD - s/p empiric dilation 18mm (?  subtle ring)     2) Hiatal hernia     RECOMMENDATIONS:     1) Clear liquids until 12:30pm, then soft foods rest of day.     Resume prior diet tomorrow.     2) Continue medication for acid reflux          ______________________________     Wilhemina Bonito. Eda Keys, MD          CC:  Corwin Levins, MD; The Patient          n.     eSIGNED:   Wilhemina Bonito. Eda Keys at 03/01/2011 10:17 AM          Elmarie Mainland, [phone]  Note: An exclamation mark (!) indicates a result that was not dispersed  into the flowsheet. Document Creation Date: 03/01/2011 10:18 AM _______________________________________________________________________  (1) Order result status: Final Collection or observation date-time: 03/01/2011 10:09 Requested date-time:  Receipt date-time:  Reported date-time:  Referring Physician:   Ordering Physician: Fransico Setters [phone]) Specimen Source:  Source: [name] Order Number: [id number] Lab site:

## 2011-03-09 LAB — GLUCOSE, CAPILLARY: Glucose-Capillary: 90 mg/dL (ref 70–99)

## 2011-04-19 NOTE — Procedures (Signed)
[name], ART NO.:  [id number]   MEDICAL RECORD NO.:  [id number]          PATIENT TYPE:  OUT   LOCATION:  SLEEP CENTER                 FACILITY:  [facility]   PHYSICIAN:  Coralyn Helling, MD        DATE OF BIRTH:  1946/06/11   DATE OF STUDY:  12/18/2008                            NOCTURNAL POLYSOMNOGRAM   REFERRING PHYSICIAN:  Leslye Peer, MD   REFERRING PHYSICIAN:  Leslye Peer, MD   INDICATION FOR STUDY:  Ms. Snowden is a 65 year old female who has a  history of vocal cord dysfunction and reflux disease.  She has sleep  disruption with frequent episodes of coughing at night.  She is referred  to the Sleep Lab to evaluate whether she has sleep disordered breathing  contributing to her nocturnal symptoms.   EPWORTH SLEEPINESS SCORE:  Height is 5 feet 2 inches.  Weight is 189  pounds.  BMI is 35.  Neck size is 13 inches.  Epworth Score is zero.   MEDICATIONS:  Aleve, estradiol, Pravachol, Symbicort, omeprazole,  Proventil, and prednisone.   SLEEP ARCHITECTURE:  Total recording time was 350 minutes.  Total sleep  time was 344 minutes with sleep efficiency was 89%.  Sleep latency was  20 minutes.  REM latency was 50 minutes.  The study was notable for lack  of slow-wave sleep.  The patient slept predominantly in the nonsupine  position.   RESPIRATORY DATA:  The average respiratory rate was 13.  The overall  apnea-hypopnea index was 0.3.  The events were exclusively obstructive  in nature.  Moderate snoring was noted by the technician.   OXYGEN DATA:  The baseline oxygenation was 98%.  The patient spent the  entire study with an oxygen saturation greater than 90% with an oxygen  saturation nadir of 92%.   CARDIAC DATA:  The average heart rate was 71 and the rhythm strip showed  normal sinus rhythm.   MOVEMENT-PARASOMNIA:  The patient had one rest room trip.  There was  frequent leg jerks, not all of which could be classified as periodic  limb  movements.  The periodic limb movement index was 6.3.  The patient  also was noted to have several episodes of coughing which caused  frequent arousals.   IMPRESSIONS-RECOMMENDATIONS:  The study does not show evidence for sleep  disordered breathing as her apnea-hypopnea index was 0.3.  She did have  several episodes of coughing which caused arousals.  She also had  frequent leg jerks, not all of which could be classified as periodic  limb movements.   What I recommend is the patient have further assessment of her asthma as  well as her reflux disease to determine if these factors are  contributing to her nocturnal cough.   In addition, she may need to have further evaluation of her leg  movements.  This could include initially checking her serum ferritin  level and if this is less than 50, I would recommend iron  supplementation.  If not then she could benefit from an empiric trial of  dopamine agonist agent such as ropinirole or Mirapex.  Coralyn Helling, MD  Diplomat, American Board of Sleep Medicine  Electronically Signed     VS/MEDQ  D:  12/20/2008 10:58:03  T:  12/21/2008 02:39:36  Job:  [id number]   cc:   Leslye Peer, MD  [address]  [place], [place] [address]

## 2011-04-19 NOTE — Assessment & Plan Note (Signed)
[facility]                             PULMONARY OFFICE NOTE   NAME:[name], [name]                        MRN:          [id number]  DATE:07/26/2007                            DOB:          1946/09/20    SUBJECTIVE:  [name] is a 65 year old woman who has been followed by  Dr. Danice Goltz in our office for asthma and upper airway irritation  and vocal cord dysfunction.  She has superimposed gastroesophageal  reflux disease and postnasal drip which made her symptoms quite  difficult to manage.  She has been maintained on standing prednisone for  several years.  She currently uses 10 mg daily.  She also uses Zyflo and  Symbicort as her maintenance medications.  She tells me that Zegerid has  made her reflux better, but she still has daily symptoms.  It is  particularly bothersome at night, and she feel gastric material come up  into her mouth, and this makes her cough.  She tells me that her  breathing is, for the most part, stable since her last visit.  She does  have some occasional exertional wheezing.  She also has every day dry  cough.   CURRENT MEDICATIONS:  1. Zegerid 40 mg nightly.  2. Prednisone 10 mg daily.  3. Flutter valve b.i.d.  4. Metoclopramide 5 mg b.i.d.  5. Estradiol 1 mg daily.  6. Pravachol 40 mg daily.  7. Symbicort 160/2.5 two puffs b.i.d.  8. Zyflo CR to b.i.d.  9. Albuterol p.r.n.  10.Allegra p.r.n.  11.Aleve p.r.n.   PHYSICAL EXAMINATION:  IN GENERAL:  This is an obese, very pleasant,  African-American woman in no distress.  Her weight is 201 pounds, temperature is 98.0, blood pressure 122/74,  heart rate is 78, SPO2 98% on room air.  HEENT EXAM:  The oropharynx is benign.  She has mild posterior  pharyngeal erythema.  Her neck is supple without lymphadenopathy.  She has significant  expiratory low-pitched stridor on expiration.  Her lungs have some bilateral, scattered, expiratory high-pitched  wheezes.  Most  of her pulmonary sounds are referred from her upper  airway, but she certainly does have some distinct lower airway wheezes  as well.  HEART:  Regular without murmur.  ABDOMEN:  Obese, soft, nontender with positive bowel sounds.  EXTREMITIES:  Show no cyanosis, clubbing, or edema.   IMPRESSION:  Airflow limitation that is mild by pulmonary function  testing function, performed in July of 2007, and which has no  bronchodilator responsiveness.  She may, indeed, have some degree of  fixed asthma, but I believe that most of her symptoms are due to upper  airway irritation and vocal cord dysfunction in the setting of severe  gastroesophageal reflux disease and postnasal drip.  I think one of our  initial goals should be to try and wean her slowly off of prednisone,  see if she is able to tolerate this.   PLAN:  1. I will continue her Symbicort and Zyflo as ordered.  2. She will continue her Zegerid as ordered, and will follow up  with      Dr. Marina Goodell regarding her gastroesophageal reflux disease.  3. I will decrease her prednisone to 7.5 mg daily for the next month,      and then we will follow up to assess her status.  If she tolerates      this well, then we will wean her prednisone further slowly over the      next several months.  4. We may need to consider changing her Allegra to standing dose to      address her postnasal drip.  We will discuss this on her next      visit.     Leslye Peer, MD  Electronically Signed    RSB/MedQ  DD: 07/26/2007  DT: 07/27/2007  Job #: [id number]   cc:   Jonny Ruiz, MD  Wilhemina Bonito. Marina Goodell, MD

## 2011-04-19 NOTE — Assessment & Plan Note (Signed)
[name]                             PULMONARY OFFICE NOTE   NAME:[name], [name] HANDS                        MRN:          [id number]  DATE:06/05/2007                            DOB:          25-Nov-1946    HISTORY OF PRESENT ILLNESS:  The patient is a 65 year old African-  American female, patient of Dr. Jayme Cloud with a known history of  difficulty to control asthmatic bronchitis, complicated by underlying  gastroesophageal reflux and allergic rhinitis.  The patient presents to  the office complaining of a 2 day history of dry cough and wheezing.  The patient is maintained on Symbicort 160/4.5 two puffs twice daily,  prednisone 10 mg daily and Zyflo CR 2 tablet b.i.d.  The patient reports  that her symptoms have been worse over the last couple of days.  She  denies any purulent sputum, hemoptysis, orthopnea, PND or leg swelling.   PAST MEDICAL HISTORY:  Is reviewed.   CURRENT MEDICATIONS:  Reviewed.   PHYSICAL EXAMINATION:  The patient is a pleasant female in no acute  distress.  She is afebrile with stable vital signs. O2 saturation is 98% on room  air.  HEENT:  Unremarkable.  NECK:  Supple without cervical adenopathy.  No JVD.  LUNGS:  Sounds reveal coarse breath sounds bilaterally with some upper  airway pseudo-wheezing and a few expiratory wheezes.  CARDIAC:  Regular rate.  ABDOMEN:  Soft, nontender.  EXTREMITIES:  Warm without any edema.   IMPRESSION/PLAN:  Acute asthmatic bronchitic flare with some upper  airway instability.  The patient is to increase her prednisone up to 40  mg, decreasing by 10 mg every 2 days.  Add in Mucinex DM twice daily for  cough control.  The patient is also recommended to increase her Reglan 5  mg up to 4 times a day to help with any residual reflux that could be  irritating the airways and maintain on a reflux preventative diet.  She will recheck here in 2 weeks with Dr. Delton Coombes for followup or sooner  if  needed.      Rubye Oaks, NP  Electronically Signed      Leslye Peer, MD  Electronically Signed   TP/MedQ  DD: 06/05/2007  DT: 06/06/2007  Job #: [id number]

## 2011-04-19 NOTE — H&P (Signed)
[name], [name]               ACCOUNT NO.:  [id number]   MEDICAL RECORD NO.:  [id number]          PATIENT TYPE:  INP   LOCATION:  1430                         FACILITY:  [facility]   PHYSICIAN:  Hollice Espy, M.D.DATE OF BIRTH:  December 28, 1945   DATE OF ADMISSION:  06/19/2008  DATE OF DISCHARGE:                              HISTORY & PHYSICAL   The patient's PCP is Dr. Oliver Barre.   CHIEF COMPLAINTS:  Chest pain.   HISTORY OF PRESENT ILLNESS:  The patient is a 65 year old African  American female with past medical history of hyperlipidemia and asthma  who woke up on the morning of July 15 with chest pain she described as  sharp, midsternal.  She had some radiation down her left arm.  Initially  was quite severe but observed to remit and went away, and she had  intermittent episodes on the 15th.  On the 16th, she started having it  more frequently, and then it seemed to stay, so she became concerned and  came to the emergency room.  In the emergency room, she had EKG done  which was unremarkable.  Lab work was done, and she was noted to have a  white count of 10.3 with no shift.  Cardiac markers were normal.  However, on physical exam she was noted to have wheezing throughout.  The patient was given aspirin and nitroglycerin.  Currently, she is  doing okay.  She complains of some shortness of breath, and when she  takes a deep inspiration, she can feels the pain in the middle of her  chest.  She denies any headaches, vision changes, dysphagia,  palpitations.  No coughing.  No abdominal pain.  No hematuria,  constipation, diarrhea, focal extremity numbness, weakness or pain.   REVIEW OF SYSTEMS:  Otherwise negative.   The patient's past medical history includes asthma, hyperlipidemia and  GERD.   MEDICATION:  She is on inhalers, prednisone 10, Nexium.   ALLERGIES:  TO PENICILLIN.   SOCIAL HISTORY:  No tobacco, alcohol or drug use.   FAMILY HISTORY:  Mom died at age 92 of MI  and sister has heart disease.   PHYSICAL EXAMINATION:  VITALS:  On admission, temperature 98, heart rate  88, blood pressure 134/79, respirations 18, O2 saturation 96% on room  air.  GENERAL:  She is alert and x3 in no apparent stress.  HEENT:  Normocephalic atraumatic.  Mucous membranes are moist.  She has  no carotid bruits.  HEART:  Regular rate and rhythm, S1-S2.  LUNGS:  Bilateral wheezing throughout.  ABDOMEN:  Soft, nontender, nondistended.  Positive bowel sounds.  EXTREMITIES:  Showed no clubbing, cyanosis or edema.   LABORATORY:  Chest x-ray shows hyperinflated lungs.  No other findings.  White count 10.3, H&H 13 and 41, MCV 70, platelet count 476, no shift.  Sodium 140, potassium 2.8, chloride 107, BUN 15, creatinine 0.9, glucose  96.  CPK 61.9, MB less than 1, , troponin I less than 0.05.  UA notes  trace leukocyte esterase and only 1 to 2 white cells.  D-dimer slightly  elevated at  0.58.  ER doctors have ordered a CT angio of the chest to  rule out PE.   ASSESSMENT AND PLAN:  1. Chest pain.  I suspected it is likely more bronchospasm given her      deep inspiration is causing pain. The patient has few risk factors      including hyperlipidemia, age and family history.  We will continue      cycling enzymes and place on telemetry.  In the morning if the      patient rules out, possible outpatient stress test versus      inpatient, will defer to primary came.  2. Asthma exacerbation.  Steroids plus nebulizers plus oxygen.  3. Gastroesophageal reflux disease. Protein-pump inhibitor.      Hollice Espy, M.D.  Electronically Signed     SKK/MEDQ  D:  06/19/2008  T:  06/19/2008  Job:  [id number]   cc:   Corwin Levins, MD  [address]  [place]  [place] [address]

## 2011-04-19 NOTE — Assessment & Plan Note (Signed)
[facility]                             PULMONARY OFFICE NOTE   NAME:[name], [name] GURAL                        MRN:          [id number]  DATE:08/27/2007                            DOB:          [birth year]    SUBJECTIVE:  [name] is a 65 year old woman with a history of severe  upper airway irritation and vocal cord dysfunction in the setting of  GERD and postnasal drip.  She also may have some degree of superimposed  fixed asthma based on her pulmonary function testing.  She is here for a  regularly scheduled followup visit.  She tells me that her breathing is  not significantly changed compared with our last visit.  She does still  get short of breath with activity.  She has had paroxysms of cough.  She  is not making any sputum.  She continues to get short of breath when she  walks or does housework.  She has been on prednisone 10 mg daily for  many, many years.   CURRENT MEDICINES:  1. Zegerid 40 mg nightly.  2. Prednisone 7.5 mg daily.  3. Flutter valve b.i.d.  4. Metoclopramide 5 mg b.i.d.  5. Estradiol 1 mg daily.  6. Pravachol 40 mg daily.  7. Symbicort 160/4.5, two puffs b.i.d.  8. Zyflo CR, two b.i.d.  9. Albuterol two puffs q.4h. p.r.n. for shortness of breath.  10.Aleve p.r.n.  11.Allegra p.r.n.   EXAM:  GENERAL:  This is a pleasant obese woman in no distress.  Her  weight is 199 pounds.  Temperature 98.0, blood pressure 122/74, heart  rate 78, SPO2 92% on room air.  HEENT EXAM:  Her oropharynx is clear.  She has some mild posterior  pharyngeal erythema.  NECK:  Supple without any lymphadenopathy.  She does have some upper  airway noise and expiratory stridor.  LUNGS:  Significant for referred upper airway noise but she may also  have some lower airway wheezing.  ABDOMEN:  Obese, soft, nontender with positive bowel sounds.  HEART:  Regular, without murmur.  EXTREMITIES:  Some trace pretibial edema.   IMPRESSION:  Multifactorial  dyspnea.  I believe that the largest  component of this is her upper airway irritation syndrome and her  superimposed obesity.  She may also have a component of fixed asthma.  I  would like to continue her Symbicort and Zyflo as currently ordered.  I  will decrease her prednisone to 5 mg daily.  If she tolerates that  decrease then we may consider a  very slow taper in the future.  I will follow up with her in 1 month to  review her symptoms.  In the  meantime, she will continue her Zegerid as ordered.  I also believe that  she may need to increase her Allegra to standing dosing; will discuss  this at our next visit as well.     Leslye Peer, MD  Electronically Signed    RSB/MedQ  DD: 09/13/2007  DT: 09/13/2007  Job #: [id number]

## 2011-04-19 NOTE — Assessment & Plan Note (Signed)
[facility]                             PULMONARY OFFICE NOTE   NAME:[name], [name]                        MRN:          [id number]  DATE:04/16/2007                            DOB:          Jun 12, 1946    HISTORY OF PRESENT ILLNESS:  The patient is a 65 year old African  American female, patient of Dr. Georgann Housekeeper with a known history of  asthmatic bronchitis, complicated by underlying gastroesophageal reflux  and allergic rhinitis.  The patient presents today for an acute visit,  complaining of a 1 week history of increased wheezing, sore throat and  dry cough.  The patient has had to increase her use of albuterol over  the last 2 days.  The patient denies any fever, purulent sputum, recent  travel or antibiotic use.  The patient is maintained on Symbicort  160/4.5 two puffs twice daily along with prednisone 10 mg and Zyflo CR 2  b.i.d.   PAST MEDICAL HISTORY:  Is reviewed.   CURRENT MEDICATIONS:  Reviewed.   PHYSICAL EXAMINATION:  The patient is a pleasant female in no acute  distress.  She is afebrile with stable vital signs.  O2 saturation is 98% on room  air.  HEENT:  Nasal mucosa is slightly pale.  Nontender sinuses.  Posterior  pharynx is clear.  NECK:  Is supple without cervical adenopathy.  No JVD.  LUNGS:  Sounds reveal expiratory wheezes bilaterally.  CARDIAC:  Regular rate.  ABDOMEN:  Is soft and nontender.  EXTREMITIES:  Warm without any edema.   IMPRESSION/PLAN:  Acute asthmatic flare.  The patient is to increase  prednisone up to 40 mg, decrease it by 10 mg every 2 days until she is  back to her baseline 10 mg daily.  She will add in Mucinex DM twice  daily. May use Tussionex #4 ounces 1 teaspoon every 12 hours as needed  for cough.  The patient will follow back up here in 1 month.  I have  recommended that she follow back up with Dr. Craige Cotta since Dr. Jayme Cloud is  moving.      Rubye Oaks, NP       C. Danice Goltz,  MD    TP/MedQ  DD: 04/16/2007  DT: 04/16/2007  Job #: [id number]

## 2011-04-19 NOTE — Assessment & Plan Note (Signed)
[facility]                             PULMONARY OFFICE NOTE   NAME:[name], [name]                        MRN:          [id number]  DATE:10/03/2007                            DOB:          Jul 21, 1946    SUBJECTIVE:  [name] is a pleasant 65 year old woman with history of  asthma as well as severe upper airway irritation and vocal cord  dysfunction that has been exacerbated by post nasal drip and GERD.  She  returns today for a regularly scheduled visit.  Since our last visit,  she has been having worse acid reflux.  She has been taking her Zegerid  daily as prescribed.  She has been seen by Dr. Marina Goodell in the past with  regard to esophageal strictures and GERD and has required dilation on at  least one occasion.  She is still having difficulty breathing with  exertion.  She wheezes and has to stop after just a short walk and with  doing any housework.  She feels that her wheezing is mostly in her upper  airway.  Last time we decreased her prednisone to 5 mg daily, she  tolerated the change without difficulty.  Her shortness of breath is,  for the most part, at baseline.   MEDICATIONS:  1. Zegerid 40 mg nightly.  2. Prednisone 5 mg daily.  3. Flutter valve b.i.d.  4. Metoclopramide 5 mg b.i.d.  5. Estradiol 1 mg daily.  6. Pravachol 40 mg daily.  7. Symbicort 160/4.5 two sprays to each nostril b.i.d.  8. Zyflo CR two b.i.d.  9. Albuterol two puffs q.4h. p.r.n.  10.Aleve p.r.n.  11.Allegra dose not known p.r.n.   PHYSICAL EXAMINATION:  VITAL SIGNS:  Her weight is 200 pounds,  temperature 97.8, blood pressure 122/74, heart rate 83, SpO2 99% on room  air.  GENERAL APPEARANCE:  This is a very pleasant, obese African American  woman who is in no distress on room air.  HEENT:  The posterior pharynx is erythematous.  NECK:  Supple.  She has significant expiratory stridor that is referred  to her lower lung fields.  LUNGS:  Her lungs are difficult  to assess due to upper airway noise, but  I do believe she also has some very mild end expiratory lower airway  wheezes.  CARDIOVASCULAR:  Heart is regular without a murmur.  ABDOMEN:  Obese, soft, nontender, with positive bowel sounds.  EXTREMITIES:  No clubbing, cyanosis, or edema.   IMPRESSION:  Multifactorial dyspnea in the setting of known severe upper  airway irritation and vocal cord dysfunction as well as asthma.  Both of  these have been exacerbated by her acid reflux which is worse in recent  weeks.  She has also been exacerbated by post nasal drip.  I have asked  her to increase her Zegerid to 40 mg b.i.d. for one month to see if this  helps both her acid reflux and her dyspnea.  If she does not get any  improvement, then I believe she needs to go back to see Dr. Marina Goodell for  further evaluation.  We have discussed decreasing her prednisone and I  will start a very slow taper today.  I will decrease her to 4 mg daily  for the next month.  Will follow up in a month and see if she tolerates  the change.  In the meantime, she will continue her Zyflo as ordered and  I asked her to change her Allegra to a daily schedule instead of p.r.n.  to see if this helps with her post nasal drip and her upper airway  irritation.  I will follow up with [name] in one month to assess her  progress.     Leslye Peer, MD  Electronically Signed    RSB/MedQ  DD: 10/03/2007  DT: 10/04/2007  Job #: [id number]   cc:   Wilhemina Bonito. Marina Goodell, MD  Corwin Levins, MD

## 2011-04-19 NOTE — Assessment & Plan Note (Signed)
[facility]                             PULMONARY OFFICE NOTE   NAME:[name], [name]                        MRN:          [id number]  DATE:06/19/2007                            DOB:          09-Nov-1946    HISTORY:  The patient is a 65 year old African-American female previous  patient of Dr. Jayme Cloud with a known history of difficult-to-control  asthmatic bronchitis sent for two-week followup.  The patient had recent  asthmatic bronchitic exacerbation.  The patient was given a steroid  burst and recommended increasing Reglan up to 4 times a day for  suspected reflux-induced coughing.  The patient returns today reporting  that she is substantially improved and has returned back to her  baseline.  The patient denies any purulent sputum, fever, chest pain,  orthopnea, PND, or leg swelling.   PAST MEDICAL HISTORY:  Reviewed.   CURRENT MEDICATIONS:  Reviewed.   PHYSICAL EXAMINATION:  GENERAL:  The patient is a pleasant female in no  acute distress.  VITAL SIGNS:  She is afebrile with stable vital signs.  HEENT:  Unremarkable.  NECK:  Supple without adenopathy.  No JVD.  LUNGS:  Reveal some coarse breath sounds with a few expiratory wheezes,  much improved since last visit.  CARDIAC:  Regular rate.  ABDOMEN:  Soft and nontender.  EXTREMITIES:  Warm without any edema.   IMPRESSION AND PLAN:  Recent asthmatic bronchitic exacerbation, much  improved status post steroid burst.  The patient is continued on  aggressive reflux prevention and will follow up with Dr. Delton Coombes in 1  month or sooner if needed.      Rubye Oaks, NP  Electronically Signed      Leslye Peer, MD  Electronically Signed   TP/MedQ  DD: 06/19/2007  DT: 06/20/2007  Job #: [id number]

## 2011-04-22 NOTE — Discharge Summary (Signed)
[name], [name]               ACCOUNT NO.:  [id number]   MEDICAL RECORD NO.:  [id number]          PATIENT TYPE:  INP   LOCATION:  1430                         FACILITY:  [facility]   PHYSICIAN:  Valerie A. Felicity Coyer, MDDATE OF BIRTH:  07-13-1946   DATE OF ADMISSION:  06/19/2008  DATE OF DISCHARGE:  06/24/2008                               DISCHARGE SUMMARY   PRIMARY CARE PHYSICIAN:  Dr. Oliver Barre   DISCHARGE DIAGNOSES:  Acute chronic obstructive pulmonary disease  exacerbation.   HISTORY OF PRESENT ILLNESS:  [name] is a 65 year old African  American female with past medical history of hyperlipidemia and asthma  who presented to [facility] Emergency Room on day of admission with  reports of several days of sharp, midsternal chest pain radiating down  left arm.  Initial workup done in the emergency room revealed EKG to be  unremarkable.  However, lab work revealed a white count of 10.3 and  physical exam revealing diffuse wheezing throughout.  Due to the  patient's past medical history and high risk for coronary artery  disease, the patient was admitted for further evaluation and treatment,  and to rule out ischemic cardiac event.   PAST MEDICAL HISTORY:  1. Hyperlipidemia.  2. Asthma.   COURSE OF HOSPITALIZATION:  1. Chest pain with shortness of breath.  CT angio done at time of      admission revealed bilateral airway thickening consistent with      bronchitis.  It was negative for pulmonary embolism, CT angio also      revealing paraseptal emphysema.  Prior to this admission the      patient did not have definitive diagnosis of COPD.  The patient did      undergo serial cardiac enzyme testing, which were negative.  Again,      EKG was unremarkable.  The patient treated with IV Solu-Medrol for      COPD exacerbation and transitioned without event to p.o.      prednisone.  The patient also placed on empiric Ceftin to be      continued as listed below at time of discharge.   The patient      responded quite well to antibiotic and steroid therapy, and denied      any recurrent chest pain since just prior to admission.   MEDICATIONS:  At time of discharge.  1. Prednisone 20 mg tablets 3 tablets p.o. x3 days, 2 tablets p.o. x3      days and 1 tablet p.o. x3 days and then 10 mg p.o. daily as prior      to this admission.  2. Ceftin 500 mg p.o. b.i.d. until gone.  3. Phenergan with codeine syrup 1 teaspoon every 6 hours p.r.n. cough.  4. DuoNeb breathing treatments q.i.d.  5. Estradiol 1 mg p.o. daily.  6. Nexium 40 mg p.o. b.i.d.  7. Symbicort 2 inhalations b.i.d.  8. Albuterol rescue inhaler 2 puffs every 4 hours p.r.n. shortness of      breath.   PERTINENT LAB WORK AT TIME OF DISCHARGE:  White cell count 10.3,  platelet  count 476, hemoglobin 13.0, hematocrit 40.4, sodium 140,  potassium 3.8, BUN 15, creatinine 0.9.   DISPOSITION:  The patient felt medically stable for discharge home at  this time.  The patient to resume antibiotic treatment as well as slow  steroid taper at time of discharge. The patient instructed to call the  office for any worsening cough or increasing shortness of breath.  The  patient also instructed to follow up with her primary care physician,  Dr. Oliver Barre, on Monday, July 27 at 9:15 a.m.  In addition the patient  scheduled to see Rubye Oaks, NP on Tuesday, July 28 at 10:15 a.m.  Greater than 30 minutes spent on discharge planning.      Cordelia Pen, NP      Raenette Rover. Felicity Coyer, MD  Electronically Signed    LE/MEDQ  D:  07/10/2008  T:  07/10/2008  Job:  [id number]

## 2011-04-22 NOTE — H&P (Signed)
NAME:  [name], [name]                           ACCOUNT NO.:  [id number]   MEDICAL RECORD NO.:  [id number]                   PATIENT TYPE:  INP   LOCATION:  5731                                 FACILITY:  [facility]   PHYSICIAN:  Danice Goltz, M.D. [facility]            DATE OF BIRTH:  June 19, 1946   DATE OF ADMISSION:  02/04/2004  DATE OF DISCHARGE:                                HISTORY & PHYSICAL   CHIEF COMPLAINT:  Asthma attack x5 days.   HISTORY OF PRESENT ILLNESS:  The patient is a pleasant 65 year old African  American female patient of Dr. Marcos Eke' with a known history of difficult  to control asthmatic bronchitis who was maintained on chronic steroids.  The  patient is also on Pulmicort, Foradil and Spiriva.  She had previously been  on Xolair, however, this has been discontinued in the past.  The patient  presents for an acute office visit related to increased wheezing, dyspnea on  exertion, dry cough that has worsened over the last five days.  The patient  was seen in the emergency room three days ago and was treated with a  prednisone taper and nebulizer treatments.  She is currently on prednisone  40 mg.  She has had no improvement of symptoms and is having difficulty  breathing in the office today.  She denies any purulent sputum, fever, chest  pain, orthopnea, PND, leg swelling, or overt reflux symptoms.  The patient  has failed outpatient therapy and therefore will require hospitalization.   PAST MEDICAL HISTORY:  1. Hypertension.  2. Asthma.  3. Osteoporosis.  4. Gastroesophageal reflux disease.  5. Vocal cord dysfunction.  6. Depression.   PAST SURGICAL HISTORY:  Bunionectomy on the right foot.   CURRENT MEDICATIONS:  1. Hydrochlorothiazide 12.5 mg daily.  2. Nexium 40 mg b.i.d.  3. Actonel 35 mg weekly on Monday.  4. Prozac 20 mg daily.  5. Prednisone 5 mg daily (this is her recent dose).  6. Pulmicort four puffs b.i.d.  7. Spiriva daily.  8. Foradil b.i.d.  9.  Albuterol nebulizer p.r.n.   ALLERGIES:  1. PENICILLIN.  2. ASPIRIN.   SOCIAL HISTORY:  The patient is divorced.  Has six children.  She works full  time as a Loss adjuster, [employer].  She is a former smoker who quit 20 years ago.  No  alcohol or drug use.   FAMILY HISTORY:  Positive for coronary artery disease, diabetes, asthma,  colon cancer and common stroke.   REVIEW OF SYMPTOMS:  Essentially negative except as noted above.   PHYSICAL EXAMINATION:  GENERAL APPEARANCE:  The patient is a pleasant black  female who was in mild respiratory distress.  VITAL SIGNS:  Temperature 98.0, blood pressure 120/80, pulse regular at 97,  respiratory rate 24, O2 saturation 100%.  Weight is 166.  HEENT:  PERRLA.  Conjunctivae not injected.  Nasal mucosa pink and moist.  TMs are normal.  Posterior pharynx is clear.  The patient has upper  dentures.  A small mobile nodule along the mid forehead.  NECK:  Supple without cervical adenopathy.  No JVD.  Carotids are equal with  positive upstrokes bilaterally without any bruits.  No thyromegaly or  nodules noted.  LUNGS:  Diffuse inspiratory and expiratory wheezes.  Scattered rhonchi.  Significant accessory muscle  use.  CARDIOVASCULAR:  Regular rate and rhythm without murmurs, rubs, or gallops.  ABDOMEN:  Soft __________  Bowel sounds are positive throughout all four  quadrants.  No guarding or rebound.  EXTREMITIES:  Warm without any clubbing, cyanosis, or edema.  NEUROLOGIC:  No focal deficits.   IMPRESSION AND PLAN:  Status asthmaticus.  The patient has failed increased  outpatient therapy.  She will require hospitalization for IV steroids,  oxygen therapy and hand held nebulizer.  Chest x-ray and labs are pending  and will follow up accordingly.      Tammy Parrett, P.A. [facility]                   Danice Goltz, M.D. [facility]    TP/MEDQ  D:  02/04/2004  T:  02/04/2004  Job:  [id number]   cc:   Danice Goltz, M.D. [facility]   Corwin Levins, M.D. [facility]

## 2011-04-22 NOTE — Assessment & Plan Note (Signed)
[facility]                             PULMONARY OFFICE NOTE   NAME:[name], [name] SERENA                        MRN:          [id number]  DATE:12/07/2006                            DOB:          1946/09/01    SUBJECTIVE:  This is a followup visit for [name], who is a 65-year-  old woman followed by Dr. Jayme Cloud for asthma and upper airway  irritation exacerbated by GERD and allergic rhinitis.  She tells me that  she has been doing fairly well until about 2 weeks when she began to  develop cold symptoms that included headache, hoarse voice, cough that  has been nonproductive of sputum, and some exertional dyspnea.  She also  is having right ear fullness and a little bit of decreased hearing on  that side as well as some balance difficulty.  She is concerned that she  may have an otitis.  She denies any fevers, chills, sweats or other  constitutional symptoms.  Her appetite has been good.   MEDICATIONS:  1. Zegerid 40 mg nightly.  2. Prednisone 10 mg daily.  3. Flutter valve q.i.d.  4. Azmacort 4 puffs b.i.d.  5. Metoclopramide 5 mg q.i.d.  6. Estradiol 1 mg daily.  7. Pravachol 40 mg daily.  8. Symbicort 160/4.5 two sprays b.i.d.  9. I-Flow CR 2 b.i.d.  10.Albuterol p.r.n.   EXAMINATION:  GENERAL:  This is an obese, pleasant, African American  woman who is in no distress.  VITAL SIGNS:  Weight 196 pounds.  Temperature 98.2.  Blood pressure  120/78.  Heart rate 88.  SPO2 98% on room air.  HEENT EXAM:  The oropharynx is clear.  There is a little bit of  posterior pharyngeal erythema.  Her ears have some fullness behind the  tympanic membranes, but there is good light reflex and there is no  purulence.  NECK:  Has some very slightly inspiratory stridor.  LUNGS:  Coarse with bilateral low-pitched expiratory wheezes.  HEART:  Regular rate and rhythm without murmur.  ABDOMEN:  Benign.  EXTREMITIES:  Have no cyanosis, clubbing or edema.   IMPRESSION:  Asthma with a mild exacerbation in the setting of an upper  respiratory infection.  I will treat her exacerbation with a quick burst  of prednisone 40 mg daily for 5 days, and then she will immediately  switch back to her usual dose of 10 mg daily.  We will treat her upper  respiratory infection symptomatically, and I have asked her to restart  her Mucinex to be taken b.i.d. until her upper respiratory infection has  resolved.  She knows to call if she has any  worsening of symptoms or any further difficulty.  Otherwise, she will  follow up with Dr. Jayme Cloud in 2 months.     Leslye Peer, MD  Electronically Signed    RSB/MedQ  DD: 12/07/2006  DT: 12/07/2006  Job #: [id number]   cc:   Gailen Shelter, MD

## 2011-04-22 NOTE — Discharge Summary (Signed)
[place]  Patient:    [name], [name] Visit Number: [id number] MRN: [id number]          Service Type: MED Location: [phone] Attending Physician:  Gailen [name] Dictated by:   Danice Goltz, M.D. Admit Date:  01/09/2002 Discharge Date: 01/14/2002                             Discharge Summary  DATE OF BIRTH:  [birth year]  DISCHARGE DIAGNOSES: 1. Status asthmaticus secondary to exacerbation of chronic obstructive asthma    due to tracheobronchitis. 2. Acute tracheobronchitis, no organism identified. 3. Gastroesophageal reflux with associated vocal cord dysfunction. 4. Vocal cord dysfunction. 5. Hyperglycemia secondary to steroids.  HISTORY OF PRESENT ILLNESS:  [name] is a 65 year old African-American female well known to my practice due to chronic obstructive asthma.  She presented on January 09, 2002, after several attempts at outpatient management to see [name].  At that time, the patient appeared to be in significant distress, and [name] opted to admit her after having failed more than adequate outpatient therapy.  For the details of her past medical history, current medications, and examination at the time of admission, please refer to [name] history and physical as recorded in the chart.  HOSPITAL COURSE:  The patient was admitted for aggressive management of her status asthmaticus.  She received aggressive nebulizer therapy and pulmonary toilet.  It was noted during the hospitalization that despite adequate proton pump inhibitor therapy the patient still had break through gastroesophageal reflux symptoms.  She is being worked up by gastroenterology at [name] for this.  Reglan was added to her regimen, and she was maintained on antibiotics for mucopurulent tracheobronchitis.  She eventually improved on this therapy. Her only complications were mild hemorrhoidal bleed which occurred due to constipation.  This  was taken care of with stool softeners and hemorrhoidal care.  The patient is to have colonoscopy as an outpatient by  GI. Additionally, the patient was noted to be hyperglycemic.  This was taken care of with a no concentrated sweets diet.  By January 14, 2002, the patient was noted to be at her baseline, and definitely improved condition compared to her admission.  Her gastroesophageal reflux had resolved, and her vocal cord dysfunction which was manifested as a pseudowheeze had cleared.  DISCHARGE MEDICATIONS: 1. Singular 10 mg q.d. 2. Albuterol and Atrovent nebulizers q.i.d. 3. Foradil one inhalation b.i.d. 4. Pulmicort 4 inhalations b.i.d. 5. Reglan 10 mg one tablet before meals and at bedtime. 6. Nexium 40 mg b.i.d. before meals. 7. Flonase 2 inhalations to each nostril once daily. 8. Tequin 400 mg one tablet q.d. to complete a 10 day course, three more days    given to the patient. 9. Prednisone in a tapering dose of 40 mg b.i.d. x2 days, then 30 mg b.i.d. x3    days, then 40 mg q.d. x3 days, then 30 mg q.d. x3 days, then 20 mg q.d.    until further instructions.  ACTIVITY:  As tolerated.  DIET:  No concentrated sweets diet.  FOLLOWUP:  The patient was instructed to call my office for an appointment in a weeks time.  She was also instructed to continue pulmonary toilet with the use of a flutter valve device.  DISPOSITION:  The patient is discharged to home in satisfactory and improved condition. Dictated by:   Danice Goltz, M.D. Attending Physician:  Gailen [name] DD:  02/07/02 TD:  02/08/02 Job: [id number]

## 2011-04-22 NOTE — Discharge Summary (Signed)
[name], [name]                 ACCOUNT NO.:  [id number]   MEDICAL RECORD NO.:  [id number]          PATIENT TYPE:  INP   LOCATION:  5729                         FACILITY:  [facility]   PHYSICIAN:  Gordy Savers, M.D. LHCDATE OF BIRTH:  06-15-1946   DATE OF ADMISSION:  02/27/2006  DATE OF DISCHARGE:  03/08/2006                                 DISCHARGE SUMMARY   DISCHARGE DIAGNOSES:  1.  Acute asthma exacerbation.  2.  E. coli bacteremia.  3.  Deceased TSH with normal T4.   HISTORY OF PRESENT ILLNESS:  The patient is a 65 year old African-American  female who presented  on February 27, 2006 to the emergency room with  complaints of shortness of breath as well as difficulty walking and sleeping  secondary to breathing. She has a history of asthma and is on chronic  prednisone. The patient was admitted for further evaluation.   PAST MEDICAL HISTORY:  1.  History of bronchial asthma on chronic prednisone, last hospitalization      March 2005 with status asthmaticus.  2.  Hypertension.  3.  Status post bilateral CTR right performed on January 2007, left      performed on November 2006.  4.  History of depression.   HOSPITAL COURSE:  ACUTE ASTHMA EXACERBATION.  The patient was admitted and  was started on IV steroids which were changed to p.o. prednisone. The  patient's symptoms worsened with transfer over to p.o. prednisone and she  was placed back on IV steroids. As the patient stabilized, an additional  attempt was made to transfer the patient back to p.o. prednisone which was  done successful the second time. She was started on IV Avelox. Blood  cultures were sent on admission which grew Gram negative rods/E. coli which  was pansensitive. This was presumably from a urinary tract source though  patient had no clinical signs of UTI. She will be continued on Avelox for a  total of 7 additional days as well as rapid prednisone taper down to her 10  mg daily baseline.   DISCHARGE  MEDICATIONS:  1.  Prednisone 50 mg on April 5 and April 6 and 40 mg on April 7 and April 8      and then 30 mg on April 9 and April 10 then 20 mg on April 11 and then      resume 10 mg p.o. daily as before.  2.  Avelox 400 mg p.o. daily for 7 days.  3.  Foradil 1 inhalation twice daily.  4.  Xopenex nebulizer twice daily and q.6 h p.r.n.  5.  Azmacort 2 puffs 4 times daily.  6.  __________ 40/1100 twice daily.  7.  Reglan 10 mg p.o. b.i.d. as before.  8.  Protonix 80 mg p.o. daily.  9.  Allegra as needed.   LABORATORY DATA AT DISCHARGE:  Blood cultures, E. coli 2/2 bottles.   FOLLOW UP:  The patient is scheduled to followup with Dr. Oliver Barre on  Wednesday, April 11 at 10:00 a.m. She is instructed to call Dr. Jonny Ruiz should  she develop  fever over 101, worsening cough or shortness of breath. She is  also instructed to followup with Dr. Jayme Cloud in 2-3 weeks and Rubye Oaks, NP, pulmonary, in 1 week.      Melissa S. Peggyann Juba, NP    ______________________________  Gordy Savers, M.D. [facility]    MSO/MEDQ  D:  03/08/2006  T:  03/09/2006  Job:  [id number]

## 2011-04-22 NOTE — Assessment & Plan Note (Signed)
[facility]                               PULMONARY OFFICE NOTE   NAME:YOUNGDavis, Vannatter                          MRN:          [id number]  DATE:  08/04/2006                              DOB:      Aug 21, 1946    This is a very complex 65 year old African-American female with chronic  severe persistent asthma with numerous factors.  Triggers include  gastroesophageal reflux with laryngopharyngeal component, allergy and IgE  mediation and medical non-compliance.  The patient presents today for a  followup.  She states that she has been wheezing for the last week.  When  asked about her usual medications, she states that she has been compliant.  I am however not convinced of this.   CURRENT MEDICATIONS:  As noted on the intake sheet.  These have been  reviewed and are accurate.  Please note that we have offered this patient  previously a medication calendar which she has also discontinued using.   PHYSICAL EXAMINATION:  VITAL SIGNS:  Noted.  Oxygen saturation is 98% on  room air.  GENERAL:  This is a well-developed, somewhat obese African-American female  who is in no acute distress.  HEENT:  Unremarkable.  NECK:  Supple.  No adenopathy noted.  No JVD.  LUNGS:  Are with some end-expiratory wheezes throughout.  CARDIAC:  Regular rate, rhythm. No rubs, murmurs, or gallops heard.  EXTREMITIES:  Patient has no cyanosis, no clubbing, no edema noted.   We did provide today Xopenex nebulization therapy x1.  This cleared the  patient's symptoms.  As the patient was leaving she also did complain of  difficulties with sleep.  This is a long-standing issue.   IMPRESSION:  1. Difficult to control severe persistent asthma due to the factors as      noted above with recent exacerbation due to environmental factors.  2. Mild insomnia of long standing.  3. Gastroesophageal reflux which currently is fairly well compensated.   PLAN:  1. Will be for the patient to  continue her therapy as is.  This includes      prednisone 10 mg daily.  She is also on Symbicort 164.5 two inhalations      twice a day with spacer.  The patient claimed her spacer was lost and      we did Delfino Friesen with a new OptiHaler and instructed her on the proper      use of inhalers again.  2. She is to continue Zegerid 40 mg at bedtime.  3. Continue Zyflo 600 mg four times a day.  4. Continue use of rescue inhaler as necessary.  5. For her mild insomnia,we will provide Trazodone 50 mg at bedtime.  6. Followup will be in 4-6 weeks' time.  She is to contact us prior to      that time should any new problems arise.                                   Gailen Shelter, MD  CLG/MedQ  DD:  08/07/2006  DT:  08/07/2006  Job #:  [id number]

## 2011-04-22 NOTE — Discharge Summary (Signed)
NAME:  [name], [name]                           ACCOUNT NO.:  [id number]   MEDICAL RECORD NO.:  [id number]                   PATIENT TYPE:  INP   LOCATION:  5732                                 FACILITY:  [facility]   PHYSICIAN:  Danice Goltz, M.D. [facility]            DATE OF BIRTH:  03/06/1946   DATE OF ADMISSION:  DATE OF DISCHARGE:  02/09/2004                                 DISCHARGE SUMMARY   DISCHARGE DIAGNOSES:  1. Status asthmaticus due to exacerbation of asthma, now  resolved.  1. Gastroesophageal reflux with vocal cord dysfunction.  2. History of depression.   HISTORY OF PRESENT ILLNESS:  The patient was admitted on March 2 after  evaluation in the office by my nurse practitioner, Rubye Oaks, P.A. and  by Dr. Marcelyn Bruins.  At that time,  she was noted to have an asthma  exacerbation for approximately 5 days.  She did not have any mucopurulent  sputum production, did not have any fevers or chills. This appeared to be  entirely environmentally mediated, or due to her gastroesophageal reflux,  which was poorly controlled.   For details of findings at that time, please refer to Tammy Parrett's  dictation on March 2.   HOSPITAL COURSE:  The patient was admitted, started on IV fluids, IV Solu-  Medrol and aggressive management of gastroesophageal reflux as well as  pulmonary toilet.  The patient gradually improved.  She was placed on an  aggressive regimen for management of her gastroesophageal reflux.  Reglan  was discontinued due to untoward side-effects to the patient. This was  substituted with Zelnorm which the patient has tolerated very well and this  is actually doing very well for her.   The patient is now back to her baseline. She continues to have some baseline  wheezing, however, this is not unusual for her, and the patient is without  any distress, saturating at 100% and able to ambulate fully without  difficulty. She has received maximum hospital benefit and can now  continue  her therapy as an outpatient.   DISPOSITION:  The patient was discharged to home.  She is not to return to  work until seen by the nurse practitioner or a physician in follow up.  She  is not to use any of her Floredil or Pulmicort inhalers until evaluated at  the office.  She is to follow up in 2 days' time at the office and bring all  of her medications with her.   DISCHARGE MEDICATIONS:  1. Nexium 1 capsule twice a day before meals.  2. Prozac 20 mg 1 daily.  3. Spiriva 1 capsule inhaled 1 daily.  4. Hydrochlorothiazide 12.5 mg 1 daily.  5. Actonel 35 mg weekly.  These are as the patient had previously.  New medications include:  1. Prednisone 20 mg tablets, 3 tablets daily for 2 days, then 2 tablets     daily  for 3 days, then 1 tablet daily until seen by the physician or     nurse practitioner.  2. Xopenex/cromolyn nebulizer solutions 4 times a day, followed by Acapella     flutter valve therapy.  3. Zelnorm 60 mg one tablet before meals twice a day.  4. Mucinex DM over-the-counter 2 tablets twice a day.   CONDITION ON DISCHARGE:  The patient is competent to manage her own affairs.  She has been discharged in markedly improved condition and clinically  stable.                                                Danice Goltz, M.D. [facility]    LG/MEDQ  D:  02/09/2004  T:  02/09/2004  Job:  [id number]

## 2011-04-22 NOTE — Assessment & Plan Note (Signed)
[facility]                             PULMONARY OFFICE NOTE   NAME:[name], [name]                        MRN:          [id number]  DATE:02/08/2007                            DOB:          26-Jun-1946    HISTORY OF PRESENT ILLNESS:  The patient is a 65 year old African  American female patient of Dr. Jayme Cloud who has a known history of  asthmatic bronchitis, complicated by underlying gastroesophageal reflux  and allergic rhinitis.  Presents for 1 week followup.  Last visit  patient was having asthmatic bronchitic exacerbation.  She was treated  with a steroid burst and a nebulizer treatment in the office.  She  returns today much improved and is back down to her baseline of  prednisone 10 mg.  Patient denies any purulent sputum, chest pain,  orthopnea, PND, or leg swelling.   PAST MEDICAL HISTORY:  Reviewed.   CURRENT MEDICATIONS:  Reviewed.   PHYSICAL EXAMINATION:  The patient is a pleasant female in no acute  distress.  She is afebrile with stable vital signs.  HEENT:  Unremarkable.  NECK:  Supple without adenopathy.  No JVD.  LUNGS:  Sounds reveal a few expiratory wheezes bilaterally with some  upper airway pseudo wheezing.  CARDIAC:  Regular rate and rhythm.  ABDOMEN:  Soft and nontender.  EXTREMITIES:  Warm without any edema.   IMPRESSION AND PLAN:  Recent exacerbation of asthmatic bronchitis, now  improved.  The patient will continue on present regimen, follow back up  with Dr. Jayme Cloud in 6-8 weeks or sooner if needed.      Rubye Oaks, NP  Electronically Signed      Gailen Shelter, MD  Electronically Signed   TP/MedQ  DD: 02/08/2007  DT: 02/08/2007  Job #: [id number]

## 2011-04-22 NOTE — Assessment & Plan Note (Signed)
[facility]                             PULMONARY OFFICE NOTE   NAME:[name], [name] ACKERLEY                        MRN:          [id number]  DATE:01/31/2007                            DOB:          10/13/1946    This is a very pleasant, albeit, a noncompliant, 65 year old, African  American female who has chronic persistent asthma who presents with a  complaint of increased wheezing and dyspnea on exertion over the last 3  days prior to this evaluation.  The patient saw my partner, Dr. Levy [name], on December 07, 2006.  At that time she was noted to have some  increased wheezing, and was placed on a taper of steroids.  The patient,  since then, had been doing relatively well until, again, 2 to 3 days ago  when she noticed her symptoms worsening.  She has had some cough  productive of yellowish to greenish sputum.  The patient has had some  generalized malaise and subjective fevers, but no chills.   CURRENT MEDICATIONS:  As noted on the intake sheet.  These have been  reviewed, and are accurate.   PHYSICAL EXAMINATION:  VITAL SIGNS:  As noted.  Oxygen saturation was  97% on room air.  GENERAL:  This is an obese, African American female who is in no acute  distress.  HEENT EXAMINATION:  Reveals some mucopurulent nasal drainage.  NECK:  Supple.  No adenopathy noted.  No JVD.  LUNGS:  She has end expiratory wheezes throughout.  CARDIAC EXAMINATION:  Regular rate and rhythm.  No rubs, murmur or  gallops heard.  EXTREMITIES:  Patient has no cyanosis, no clubbing, no edema noted.   The patient received Xopenex 1.25 nebulized x1 with improvement of her  symptoms.  She continues to have some mild wheezing, but her air  movement improved dramatically.   IMPRESSION:  1. Acute exacerbation of chronic obstructive asthma.  Likely secondary      to acute tracheobronchitis.  2. Acute tracheobronchitis.   PLAN:  1. Will be, therefore, to place the patient on  Levaquin 750 mg p.o. x5      days.  2. Continue inhalers as they are.  3. Continue all other medications as they are.  4. Followup will be in a week's time to see our nurse practitioner,      [name].  She is to contact us prior to that time should any      new problems arise.     Gailen Shelter, MD  Electronically Signed   CLG/MedQ  DD: 01/31/2007  DT: 01/31/2007  Job #: [id number]

## 2011-04-22 NOTE — Op Note (Signed)
[name], [name]                 ACCOUNT NO.:  [id number]   MEDICAL RECORD NO.:  [id number]          PATIENT TYPE:  AMB   LOCATION:  SDS                          FACILITY:  [facility]   PHYSICIAN:  Henry A. Pool, M.D.    DATE OF BIRTH:  Jul 21, 1946   DATE OF PROCEDURE:  12/26/2005  DATE OF DISCHARGE:                                 OPERATIVE REPORT   PREOPERATIVE DIAGNOSIS:  Right carpal tunnel syndrome.   POSTOPERATIVE DIAGNOSIS:  Right carpal tunnel syndrome.   OPERATION PERFORMED:  Right carpal tunnel release.   SURGEON:  Kathaleen Maser. Pool, M.D.   ANESTHESIA:  Regional Bier block.   INDICATIONS FOR PROCEDURE:  [name] is a 65 year old female with a history  of bilateral carpal tunnel syndrome failing conservative management.  Work-  up demonstrates evidence of marked swelling at her wrist consistent with  severe carpal tunnel syndrome.  The patient is status post previous left-  sided carpal tunnel release with good results.  She presents now for right-  sided carpal tunnel release.   DESCRIPTION OF PROCEDURE:  The patient was taken to the operating room and  placed on the operating table in supine position.  After an adequate level  of regional anesthesia was achieved, the patient was positioned with the  right upper extremity outstretched.  The patient's right hand, wrist and  forearm were prepped and draped sterilely.  A 15 blade was used to make a  linear skin incision along the midpalmar crease, just distal to the distal  wrist fold.  This was carried down sharply through the palmar fascia.  The  transverse carpal ligament was identified.  The transverse carpal ligament  was then divided exposing the underlying median nerve.  The median nerve was  protected using a Therapist, nutritional. The transverse carpal ligament was then  divided distally into the palm.  Completely transecting the distal aspect of  the transverse carpal ligament.  Fat was noted to herniate into the wound  upon  completely going through the transverse carpal ligament.  Decompression  then proceeded proximally again with the nerve protected by a Glorious Peach, the  proximal transverse carpal ligament was then divided using a 15 blade and  then later using curved Metzenbaum scissors.  This completely decompressed  the median nerve.  There was no evidence of injury to the median nerve. The  wound was then irrigated with antibiotic solution.  It was then closed in  typical fashion.  Sterile dressing was applied.  There were no  complications. The patient tolerated procedure well and she returned to  recovery room postoperatively.           ______________________________  Kathaleen Maser Pool, M.D.     HAP/MEDQ  D:  12/26/2005  T:  12/26/2005  Job:  [id number]

## 2011-04-22 NOTE — H&P (Signed)
[facility] [place]  Patient:    [name], [name] Visit Number: [id number] MRN: [id number]          Service Type: EMS Location: [place] [place] Attending Physician:  Ilene Qua Dictated by:   Barbaraann Share, M.D. [facility] Admit Date:  01/09/2002   CC:         Danice Goltz, M.D.   History and Physical  HISTORY OF PRESENT ILLNESS:  The patient is a 65 year old black female that is normally followed by Dr. Danice Goltz for significant bronchial asthma that is complicated by allergic rhinitis/sinusitis as well as gastroesophageal reflux disease which is fairly severe.  The patient, over the last three to four weeks, has had increasing shortness of breath associated with congestion and cough with purulence.  She has had very aggressive workup with multiple courses of antibiotics, prednisone, and also changes in her medication. Despite this, she has continued to struggle.  She comes into the office today where she has continued to cough up yellow mucous and is congested, and is in acute bronchospasm at this point in time.  She obviously has failed outpatient treatment and will need to be admitted for more aggressive therapy.  PAST MEDICAL HISTORY:  1. Bronchial asthma as stated above.  2. Gastroesophageal reflux disease.  3. Allergic rhinitis.  4. History of hemorrhoidectomy.  5. Status post hysterectomy.  6. History of carpal tunnel release.  7. Hypertension.  CURRENT MEDICATIONS:  1. Hydrochlorothiazide 12.5 one q.d.  2. Pulmicort four sprays b.i.d.  3. Foradil one spray b.i.d.  4. Nexium 40 mg b.i.d.  5. Actonel one time a week.  6. Currently on prednisone taper.  7. Singulair 10 mg q.h.s.  8. Albuterol p.r.n.  9. Rhinocort Aqua p.r.n. 10. Xopenex nebulizer p.r.n. 11. Delsym p.r.n.  ALLERGIES:  PENICILLIN.  SOCIAL HISTORY:  The patient works as a Advertising copywriter.  She has a history of smoking for 30 years but has not done so in quite some  time.  FAMILY HISTORY:  Remarkable for her father and mother dying of heart disease. Her father also had colon cancer and a sister with stomach cancer.  REVIEW OF SYSTEMS:  As per history of present illness.  PHYSICAL EXAMINATION:  GENERAL:  She is a well-developed black female in moderate respiratory distress.  VITAL SIGNS:  Blood pressure 130/90, pulse 107, temperature 98.9, weight 160 pounds, O2 saturation on room air 100%.  HEENT:  Pupils equal, round, and reactive to light and accommodation. Extraocular muscles are intact.  Nares are patent without discharge; however, the mucosa along the turbinates appear extremely inflamed with mucopurulent material.  Oropharynx was clear.  NECK:  Supple without JVD or lymphadenopathy.  There is no palpable thyromegaly.  CHEST:  Reveals diffuse rhonchi and wheezes with some component of pseudowheezing.  There is a lot of congested coughing.  CARDIAC:  Reveals a tachycardic but regular rhythm.  ABDOMEN:  Soft, nontender, nondistended, with good bowel sounds.  GENITAL/RECTAL/BREAST:  Not done and not indicated.  EXTREMITIES:  Lower extremities are without edema and good pulses distally with no calf tenderness.  NEUROLOGIC:  She is alert and oriented x 3 with no obvious motor deficits.  IMPRESSION:  Bronchial asthma with acute exacerbation and asthmatic bronchitis that is not responding to outpatient treatment.  She will obviously need inpatient care to help her get over this.  I suspect that GERD and sinus disease may be playing a role here.  PLAN: 1. Admit for IV antibiotics, steroids, as  well as aggressive bronchodilators. 2. Cough suppression. 3. CT scan of the sinuses. Dictated by:   Barbaraann Share, M.D. [facility] Attending Physician:  Ilene Qua DD:  01/09/02 TD:  01/09/02 Job: [id number]

## 2011-04-22 NOTE — Assessment & Plan Note (Signed)
[facility]                               PULMONARY OFFICE NOTE   NAME:[name], [name]                        MRN:          [id number]  DATE:10/19/2006                            DOB:          Jan 09, 1946    [name] is a 65 year old African-American female with chronic obstructive  asthma who presents for followup of the same.  She actually presents today  with absolutely no complaints whatsoever.  She currently is maintained on  Symbicort, Zyflo, and Zegerid for gastroesophageal reflux.  She does have a  huge component of upper airway and vocal cord dysfunction.   CURRENT MEDICATIONS:  As noted on the intake sheet.  These have been  reviewed and are accurate.   PHYSICAL EXAMINATION:  VITALS:  As noted.  Oxygen saturation is 100% on room  air.  GENERAL:  This is a well-developed, moderately obese African-American female  who is in no acute distress.  She is fully ambulatory.  HEENT:  Unremarkable.  NECK:  Supple.  No adenopathy noted.  No JVD.  LUNGS:  She has some end-expiratory wheezes but actually is moving air  fairly well.  This is baseline for this patient.  CARDIAC:  Regular rate and rhythm.  No murmurs, rubs or gallops.  EXTREMITIES:  Patient has no clubbing, cyanosis or edema.   IMPRESSION:  Chronic obstructive asthma.  The patient is well compensated in  this regard or as compensated as she can get.   PLAN:  I did provide her with prescriptions and samples of Symbicort 160/4.5  2 puffs twice daily.  The patient is to be switched to Zyflo CR 2 tablets  twice daily to replace her current Zyflo, which is four times a day.  Continue Zegerid 40 mg daily, samples and prescriptions given to the  patient.  Note also that on the Zyflo, we did provide her samples.  Followup  will be in 6-8 weeks time.  Contact us prior to that time should any new  problems arise.     Gailen Shelter, MD  Electronically Signed    CLG/MedQ  DD:  10/20/2006  DT: 10/20/2006  Job #: [id number]   cc:   Corwin Levins, MD

## 2011-04-22 NOTE — Discharge Summary (Signed)
NAME:  [name], [name]                           ACCOUNT NO.:  [id number]   MEDICAL RECORD NO.:  [id number]                   PATIENT TYPE:  INP   LOCATION:  5727                                 FACILITY:  [facility]   PHYSICIAN:  Danice Goltz, M.D. [facility]            DATE OF BIRTH:  September 02, 1946   DATE OF ADMISSION:  11/20/2002  DATE OF DISCHARGE:  11/22/2002                                 DISCHARGE SUMMARY   DISCHARGE DIAGNOSES:  1. Status asthmaticus.  2. Gastroesophageal reflux disease.  3. Vocal cord dysfunction.  4. Osteoporosis, which is treated with Actonel.   HISTORY OF PRESENT ILLNESS:  The patient is a 65 year old African American  female with difficult-to-control asthma, who presents with status  asthmaticus.  She developed increasing shortness of breath with baseline  dyspnea and placed a call to the M.D.  An on-call physician increased her  prednisone and increased her fluids.  She proved refractory to outpatient  treatment and therefore was seen in the emergency department at [facility].  She was without chest pain or nausea, vomiting or headache.  She  was having respiratory distress with status asthmaticus and was admitted for  further evaluation and treatment.  Of note, her past medical history  consists of hypertension, asthma with chronic obstructive airways disease,  she is always wheezing, gastroesophageal reflux disease with vocal cord  dysfunction and osteoporosis.   ALLERGIES:  Allergies are PENICILLIN and ASPIRIN.   LABORATORY DATA:  WBC is 17.4, hemoglobin 12.8, hematocrit is 39.4,  platelets are 450,000; neutrophils were 84.  Influenza A and B were both  negative from nose swabs.  Sputum culture was unacceptable.  Sodium was 139,  potassium 4.1, chloride was 105, CO2 was 26, glucose 113, BUN 10, creatinine  0.8, calcium 9.4, albumin 3.2.   HOSPITAL COURSE:  Problem 1:  STATUS ASTHMATICUS:  The patient has difficult-  to-control asthma, presented  with increasing dyspnea.  Of note, she is  always wheezing and it is very difficult to ascertain when she is in acute  exacerbation.  She was treated with high-dose steroids, antibiotics and  nebulized bronchodilators with supplemental oxygen and she quickly returned  to her normal pulmonary baseline of wheezing at rest but without dyspnea.  She was ready for discharge home by November 22, 2002, to be continued on  Tequin, Tussionex for cough and a prednisone taper.   Problem 2:  GASTROESOPHAGEAL REFLUX DISEASE:  This was treated with b.i.d.  Nexium.   Problem 3:  HYPERTENSION:  She will be continued on her hydrochlorothiazide.   Problem 4:  VOCAL CORD DYSFUNCTION:  She will remain on proton pump  inhibitors along with p.r.n. Tussionex.   MEDICATIONS:  Her medication list will consist of:  1. Tequin 400 mg once a day.  2. Tussionex 5 cc q.12h. p.r.n. as needed.  3. Prednisone on a taper -- 40 mg b.i.d. for  two days, 40 mg a day for four     days, 30 mg a day for four days, 20 mg a day for four days, 10 mg a day     and then stay.  4. Hydrochlorothiazide 25 mg once a day.  5. Floredil one puff two times a day.  6. Pulmicort four puffs b.i.d.  7. Nexium 40 mg b.i.d.  8. Actonel 35 mg one q.wk.  9. Accolate 20 mg two times a day.  10.      Albuterol nebulizer 2.5 p.r.n., no greater than four times a day.   DIET:  Her diet is no concentrated sweets.   FOLLOWUP:  She has got a followup appointment with Dr. Danice Goltz on  December 09, 2002 at 3 p.m.   DISPOSITION/CONDITION ON DISCHARGE:  She has returned to her normal  pulmonary baseline of wheezing but without acute dyspnea.     Brett Canales Minor, A.C.N.P. [facility]                 Danice Goltz, M.D. [facility]    SM/MEDQ  D:  11/22/2002  T:  11/22/2002  Job:  [id number]

## 2011-04-22 NOTE — Op Note (Signed)
[name], BECKNELL                 ACCOUNT NO.:  [id number]   MEDICAL RECORD NO.:  [id number]          PATIENT TYPE:  AMB   LOCATION:  SDS                          FACILITY:  [facility]   PHYSICIAN:  Henry A. Pool, M.D.    DATE OF BIRTH:  03/15/1946   DATE OF PROCEDURE:  10/10/2005  DATE OF DISCHARGE:                                 OPERATIVE REPORT   SURGEON:  Sherilyn Cooter A. Pool, M.D.   PREOPERATIVE DIAGNOSIS:  Left carpal tunnel syndrome.   POSTOPERATIVE DIAGNOSIS:  Left carpal tunnel syndrome.   PROCEDURE:  Left carpal tunnel release.   ANESTHESIA:  Regional Baer block.   INDICATIONS FOR PROCEDURE:  [name] is a 65 year old female with bilateral  carpal tunnel syndrome failing conservative management.  Her symptoms are  greater on the left side.  She presents now for a left-sided carpal tunnel  release.   DESCRIPTION OF PROCEDURE:  The patient is taken to the operating room where  she is placed under IV sedation and a left forearm Baer block is placed by  anesthesia.  The patient's left upper extremity is prepped and draped  sterilely.  15 blade is used to make a linear skin incision along the mid  palmar crease just distal to the distal wrist fold.  This was carried down  sharply to the palmar fascia.  Palmar fascia was then divided down to the  level of the transverse carpal ligament.  Transverse carpal ligament was  then divided and the median nerve was identified.  A Glorious Peach was then placed  between the median nerve and the transverse carpal ligament.  The transverse  carpal ligament was then divided distally into the palm with good relief of  nerve compression.  Fat then herniated into the incision site.  The  transverse carpal ligament was then divided proximally in a similar fashion.  This was done up into the proximal forearm.  This completely released the  median nerve.  There is no visible injury to the median nerve.  Wound is  then irrigated with antibiotic solution.  The  wound is then closed with 4-0  Vicryl suture at the dermis and 5-0 nylon at the surface.  A sterile  dressing was applied.  There are no apparent complications.  The patient  tolerated the procedure well and she returns to the recovery room  postoperatively.           ______________________________  Kathaleen Maser Pool, M.D.     HAP/MEDQ  D:  10/10/2005  T:  10/10/2005  Job:  [id number]

## 2011-04-29 ENCOUNTER — Encounter: Payer: Self-pay | Admitting: Emergency Medicine

## 2011-05-11 ENCOUNTER — Ambulatory Visit (INDEPENDENT_AMBULATORY_CARE_PROVIDER_SITE_OTHER): Payer: Medicare Other | Admitting: Emergency Medicine

## 2011-05-11 ENCOUNTER — Encounter: Payer: Self-pay | Admitting: Emergency Medicine

## 2011-05-11 DIAGNOSIS — J309 Allergic rhinitis, unspecified: Secondary | ICD-10-CM

## 2011-05-11 DIAGNOSIS — J383 Other diseases of vocal cords: Secondary | ICD-10-CM

## 2011-05-11 DIAGNOSIS — J45909 Unspecified asthma, uncomplicated: Secondary | ICD-10-CM

## 2011-05-11 NOTE — Assessment & Plan Note (Signed)
No evidence bronchospasm. Suspect she is becoming deconditioned. -Symbicort bid -Walking oximetry today

## 2011-05-11 NOTE — Assessment & Plan Note (Signed)
Fluticasone + allegra

## 2011-05-11 NOTE — Patient Instructions (Signed)
Continue your Symbicort twice a day Continue flonase spray and allegra as you are taking them Continue omeprazole every day Walking oximetry today Start doing some light exercise, like walking, every day to build up your stamina and your breathing Follow up in 6 months or sooner if needed

## 2011-05-11 NOTE — Assessment & Plan Note (Signed)
rx the GERD and allergies agressively

## 2011-05-11 NOTE — Progress Notes (Signed)
  Subjective:    Patient ID: [name], female    DOB: [birth year], 65 y.o.   MRN: [id number]  HPI 14 -year-old woman with a history of asthma and vocal cord dysfunction both of which have been significantly impacted by severe esophageal reflux. Has a hx esophageal stricture dilations by Dr Marina Goodell. On omeprazole 20mg  two times a day. Uses Symbicort bid.   ROV 05/20/10 -- regular f/u for VCD and asthma. No problems since last visit. Has had good control of both her GERD and UA irritation right now. Takes Symbicort reliably. Has been walking, has lost 9 lbs since last visit. Mentions today that she has a nevus on her chest that has become painful when her clothes rub against it.   ROV 09/07/10 -- f/u for allergies, GERD, VCD, asthma. Gained back 4 lbs. For the last 3 weeks more trouble with SOB and UA wheezing. Using SABA 1x a day. Taking Symbicort two times a day. She was recently started on clonidine in August, she questions whether this is a factor in her SOB. Occas globus sensation. Freq cough. No real heartburn symptoms. Occas gets cough and irritation from using symbicort.   ROV 10/08/10 -- allergies, GERD, VCD > asthma. Returns for f/u. last time we stopped clonidine to see if it would help her breathing - no change. Has continued cough, UA wheezing, throat clearing, tickle in her throat. Swallowing is ok. She has globus sensation, but doesn't clear phlegm. Reliable about her omeprazole two times a day.   11/18/10 -- VCD, GERD, allergies, probable coexisting asthma. Returns for f/u. Last time restarted loratadine, fluticasone. She was already on omeprazole. Her breathing is better. Today she mentions some dizziness, no syncope, can happen with sitting or standing. The room spins. No CP, no dyspnea, no palpitations. Remains off clonidine, stopped in October to see if it would help breathing. BP is OK (she was on it for sweats).   ROV 05/11/11 -- Hx of VCD and asthma that have been exacerbated by GERD  and allergic rhinitis. She returns reporting more DOE over about the last 5 -6 months. Her wt has been stable at 206. She is not having any throat symptoms, has a strong voice. Rare SABA use, but she did need it more frequently in March. She is taking her fexofenadine, fluticasone bid, omeprazole. Symbicort bid.    Review of Systems As per HPI    Objective:   Physical Exam Gen: Pleasant, overwt, in no distress,  normal affect  ENT: No lesions,  mouth clear,  oropharynx clear, no postnasal drip  Neck: very mild exp stridor, not as severe as some prior exams  Lungs: no wheeze, some referred UA noise  Cardiovascular: RRR, heart sounds normal, no murmur or gallops, no peripheral edema  Musculoskeletal: No deformities, no cyanosis or clubbing  Neuro: alert, non focal  Skin: Warm, no lesions or rashes         Assessment & Plan:  ALLERGIC RHINITIS Fluticasone + allegra  VOCAL CORD DISORDER rx the GERD and allergies agressively  ASTHMA, MILD No evidence bronchospasm. Suspect she is becoming deconditioned. -Symbicort bid -Walking oximetry today

## 2011-06-06 ENCOUNTER — Encounter: Payer: Self-pay | Admitting: Adult Health

## 2011-06-06 ENCOUNTER — Ambulatory Visit (INDEPENDENT_AMBULATORY_CARE_PROVIDER_SITE_OTHER): Payer: Medicare Other | Admitting: Adult Health

## 2011-06-06 VITALS — BP 106/64 | HR 86 | Temp 97.6°F | Ht 62.0 in | Wt 205.0 lb

## 2011-06-06 DIAGNOSIS — J45909 Unspecified asthma, uncomplicated: Secondary | ICD-10-CM

## 2011-06-06 MED ORDER — CEFDINIR 300 MG PO CAPS: 300.0000 mg | ORAL_CAPSULE | Freq: Two times a day (BID) | ORAL | Status: AC

## 2011-06-06 MED ORDER — PREDNISONE 10 MG PO TABS: ORAL_TABLET | ORAL | Status: AC

## 2011-06-06 MED ORDER — HYDROCODONE-HOMATROPINE 5-1.5 MG/5ML PO SYRP: 5.0000 mL | ORAL_SOLUTION | Freq: Four times a day (QID) | ORAL | Status: AC | PRN

## 2011-06-06 MED ORDER — LEVALBUTEROL HCL 0.63 MG/3ML IN NEBU
0.6300 mg | INHALATION_SOLUTION | Freq: Once | RESPIRATORY_TRACT | Status: AC
  Administered 2011-06-06: 0.63 mg via RESPIRATORY_TRACT

## 2011-06-06 NOTE — Patient Instructions (Signed)
Omnicef 300mg  Twice daily  For 7 days with food  Mucinex DM Twice daily   Hydromet 1-2 tsp every 4-6 hr As needed  Cough/congestion  Fluids and rest  Prednisone taper over next week.  Please contact office for sooner follow up if symptoms do not improve or worsen or seek emergency care

## 2011-06-06 NOTE — Assessment & Plan Note (Addendum)
Asthmatic bronchitis Exacerbation  Plan;  Omnicef 300mg  Twice daily  For 7 days with food  Mucinex DM Twice daily   Hydromet 1-2 tsp every 4-6 hr As needed  Cough/congestion  Fluids and rest  Prednisone taper over next week.  Please contact office for sooner follow up if symptoms do not improve or worsen or seek emergency care

## 2011-06-06 NOTE — Progress Notes (Signed)
Subjective:    Patient ID: [name], female    DOB: 08-14-1946, 65 y.o.   MRN: [id number]  HPI 65 -year-old woman with a history of asthma and vocal cord dysfunction both of which have been significantly impacted by severe esophageal reflux. Has a hx esophageal stricture dilations by Dr Marina Goodell. On omeprazole 20mg  two times a day. Uses Symbicort bid.   ROV 05/20/10 -- regular f/u for VCD and asthma. No problems since last visit. Has had good control of both her GERD and UA irritation right now. Takes Symbicort reliably. Has been walking, has lost 9 lbs since last visit. Mentions today that she has a nevus on her chest that has become painful when her clothes rub against it.   ROV 09/07/10 -- f/u for allergies, GERD, VCD, asthma. Gained back 4 lbs. For the last 3 weeks more trouble with SOB and UA wheezing. Using SABA 1x a day. Taking Symbicort two times a day. She was recently started on clonidine in August, she questions whether this is a factor in her SOB. Occas globus sensation. Freq cough. No real heartburn symptoms. Occas gets cough and irritation from using symbicort.   ROV 10/08/10 -- allergies, GERD, VCD > asthma. Returns for f/u. last time we stopped clonidine to see if it would help her breathing - no change. Has continued cough, UA wheezing, throat clearing, tickle in her throat. Swallowing is ok. She has globus sensation, but doesn't clear phlegm. Reliable about her omeprazole two times a day.   11/18/10 -- VCD, GERD, allergies, probable coexisting asthma. Returns for f/u. Last time restarted loratadine, fluticasone. She was already on omeprazole. Her breathing is better. Today she mentions some dizziness, no syncope, can happen with sitting or standing. The room spins. No CP, no dyspnea, no palpitations. Remains off clonidine, stopped in October to see if it would help breathing. BP is OK (she was on it for sweats).   ROV 05/11/11 -- Hx of VCD and asthma that have been exacerbated by GERD  and allergic rhinitis. She returns reporting more DOE over about the last 5 -6 months. Her wt has been stable at 206. She is not having any throat symptoms, has a strong voice. Rare SABA use, but she did need it more frequently in March. She is taking her fexofenadine, fluticasone bid, omeprazole. Symbicort bid.   ROV 66/01/2011  Presents for an acute office visit. Complains of increased SOB, wheezing, prod cough with yellow mucus x 3days. Wheezing is getting worse today. She is under a lot of stress, tired a lot . Husband had stroke 3 years ago, requires total care- at home by her.  OTC not helping. Has some drainage.    Review of Systems Constitutional:   No  weight loss, night sweats,  Fevers, chills, + fatigue, or  lassitude.  HEENT:   No headaches,  Difficulty swallowing,  Tooth/dental problems, or  Sore throat,                No sneezing, itching, ear ache, nasal congestion, post nasal drip,   CV:  No chest pain,  Orthopnea, PND, swelling in lower extremities, anasarca, dizziness, palpitations, syncope.   GI  No heartburn, indigestion, abdominal pain, nausea, vomiting, diarrhea, change in bowel habits, loss of appetite, bloody stools.   Resp:  ,  No coughing up of blood.   Marland Kitchen  No chest wall deformity  Skin: no rash or lesions.  GU: no dysuria, change in color of urine, no urgency  or frequency.  No flank pain, no hematuria   MS:  No joint pain or swelling.  No decreased range of motion.     Psych:  No change in mood or affect. No depression or anxiety.       Objective:   Physical Exam  Gen: Pleasant, overwt, in no distress,  normal affect, NAD  ENT: No lesions,  mouth clear,  oropharynx clear, no postnasal drip  Neck: very mild exp stridor, not as severe as some prior exams  Lungs coarse BS w/ exp wheezing, talking in full sentences.   Cardiovascular: RRR, heart sounds normal, no murmur or gallops, no peripheral edema  Musculoskeletal: No deformities, no cyanosis or  clubbing  Neuro: alert, non focal  Skin: Warm, no lesions or rashes         Assessment & Plan:

## 2011-07-10 ENCOUNTER — Encounter: Payer: Self-pay | Admitting: Internal Medicine

## 2011-07-10 DIAGNOSIS — Z01811 Encounter for preprocedural respiratory examination: Secondary | ICD-10-CM | POA: Insufficient documentation

## 2011-07-10 DIAGNOSIS — Z0000 Encounter for general adult medical examination without abnormal findings: Secondary | ICD-10-CM | POA: Insufficient documentation

## 2011-07-12 ENCOUNTER — Ambulatory Visit (INDEPENDENT_AMBULATORY_CARE_PROVIDER_SITE_OTHER)
Admission: RE | Admit: 2011-07-12 | Discharge: 2011-07-12 | Disposition: A | Discharge status: Home / Self Care | Payer: Medicare Other | Source: Ambulatory Visit | Attending: Internal Medicine | Admitting: Internal Medicine

## 2011-07-12 ENCOUNTER — Ambulatory Visit (INDEPENDENT_AMBULATORY_CARE_PROVIDER_SITE_OTHER): Payer: Medicare Other | Admitting: Internal Medicine

## 2011-07-12 ENCOUNTER — Other Ambulatory Visit (INDEPENDENT_AMBULATORY_CARE_PROVIDER_SITE_OTHER): Payer: Medicare Other

## 2011-07-12 ENCOUNTER — Encounter: Payer: Self-pay | Admitting: Internal Medicine

## 2011-07-12 VITALS — BP 112/78 | HR 91 | Temp 98.3°F | Ht 62.0 in | Wt 204.2 lb

## 2011-07-12 DIAGNOSIS — E119 Type 2 diabetes mellitus without complications: Secondary | ICD-10-CM

## 2011-07-12 DIAGNOSIS — R200 Anesthesia of skin: Secondary | ICD-10-CM

## 2011-07-12 DIAGNOSIS — R55 Syncope and collapse: Secondary | ICD-10-CM | POA: Insufficient documentation

## 2011-07-12 DIAGNOSIS — R42 Dizziness and giddiness: Secondary | ICD-10-CM

## 2011-07-12 DIAGNOSIS — M5416 Radiculopathy, lumbar region: Secondary | ICD-10-CM

## 2011-07-12 DIAGNOSIS — IMO0002 Reserved for concepts with insufficient information to code with codable children: Secondary | ICD-10-CM

## 2011-07-12 LAB — CBC WITH DIFFERENTIAL/PLATELET
Basophils Absolute: 0 10*3/uL (ref 0.0–0.1)
Basophils Relative: 0.3 % (ref 0.0–3.0)
Eosinophils Absolute: 0.2 10*3/uL (ref 0.0–0.7)
HCT: 40.6 % (ref 36.0–46.0)
Hemoglobin: 13 g/dL (ref 12.0–15.0)
Lymphocytes Relative: 21.4 % (ref 12.0–46.0)
Lymphs Abs: 1.9 10*3/uL (ref 0.7–4.0)
MCHC: 32 g/dL (ref 30.0–36.0)
Neutro Abs: 6.1 10*3/uL (ref 1.4–7.7)
RBC: 5.78 Mil/uL — ABNORMAL HIGH (ref 3.87–5.11)
RDW: 17.3 % — ABNORMAL HIGH (ref 11.5–14.6)

## 2011-07-12 LAB — BASIC METABOLIC PANEL
BUN: 12 mg/dL (ref 6–23)
Chloride: 108 mEq/L (ref 96–112)
GFR: 136.4 mL/min (ref >60.00)
Potassium: 3.6 mEq/L (ref 3.5–5.1)
Sodium: 146 mEq/L — ABNORMAL HIGH (ref 135–145)

## 2011-07-12 LAB — HEPATIC FUNCTION PANEL
AST: 20 U/L (ref 0–37)
Bilirubin, Direct: 0.1 mg/dL (ref 0.0–0.3)
Total Bilirubin: 0.8 mg/dL (ref 0.3–1.2)

## 2011-07-12 MED ORDER — TRAMADOL HCL 50 MG PO TABS: 50.0000 mg | ORAL_TABLET | Freq: Four times a day (QID) | ORAL | Status: AC | PRN

## 2011-07-12 NOTE — Assessment & Plan Note (Signed)
stable overall by hx and exam, most recent data reviewed with pt, and pt to continue medical treatment as before  Lab Results  Component Value Date   HGBA1C 6.1 01/11/2011   For repeat a1c today

## 2011-07-12 NOTE — Assessment & Plan Note (Signed)
Acute on chronic worsening, with bilat pain and numbness, will check lumbar MRI as well, refer NS, pain med

## 2011-07-12 NOTE — Assessment & Plan Note (Signed)
Not orthostatic today, etiology unclear; doubt sz, low volume, or med related;  Will check Head MRI, carotids, echo, labs and refer Cardiology;  ECG today NSR - no acute changes

## 2011-07-12 NOTE — Patient Instructions (Addendum)
Take all new medications as prescribed - the pain medication Continue all other medications as before Please go to LAB in the Basement for the blood and/or urine tests to be done today Please go to XRAY in the Basement for the x-ray test Please call the phone number [phone] (the PhoneTree System) for results of testing in 2-3 days;  When calling, simply dial the number, and when prompted enter the MRN number above (the Medical Record Number) and the # key, then the message should start. You will be contacted regarding the referral for: MRI for the head and the lower back, carotid doppler test, echocardiogram, cardiology and neurosurgury referrals

## 2011-07-12 NOTE — Progress Notes (Signed)
Subjective:    Patient ID: [name], female    DOB: Feb 27, 1946, 65 y.o.   MRN: [id number]  HPI  Here to f/u; has had 3 mo of numerous episodes of dizziness charact as lightheadedness , and 2 episode of syncope; first was with getting up from chair to go to BR to urinate, and second time was later about 3 am with getting up to go to the BR;  No injury with either fall, just soreness that passed in a few days, and only out for 1-2 min, each time waking up without confusion except for wondering what happened; Not on diuretic, and not orthostatic today.  Cannot recall lighthededness just before each time she passed out, though does describe a "closing in " and blurry vision just prior.  Husband witness both episodes, no sz like activity, no incontinence.  Besides blurry vision and "things going around" no prodrome such as Pt denies chest pain, increased sob or doe, wheezing, orthopnea, PND, increased LE swelling, palpitations.  Does have occas wheeze with asthma but not related to this temporally.  Pt denies new neurological symptoms such as new headache, or facial or extremity weakness or numbness  Pt denies polydipsia, polyuria, or low sugar symptoms such as weakness or confusion improved with po intake.  Pt states overall good compliance with meds, trying to follow lower cholesterol, diabetic diet, wt overall stable but little exercise however.   Denies worsening depressive symptoms, suicidal ideation, or panic, though has ongoing anxiety, not increased recently.  Also mentions:  Acute on chronic recurring LBP with change in severity now mod to severe for 4 wks after trying to help husband into church and suffered a significant mis-step, and now with bilat pain and numbness to both legs to below the knees, but no bowel or bladder change, fever, wt loss,  worsening LE weakness, gait change or falls. Has not MRI/NS/PT/ESI in recent past Past Medical History  Diagnosis Date  . Allergic rhinitis   .  Asthma   . GERD (gastroesophageal reflux disease)   . Hyperlipidemia   . Vocal cord dysfunction   . Esophageal stricture   . Diabetes mellitus, type 2   . Colonic polyp   . Osteoporosis   . DJD (degenerative joint disease) of knee     bilateral  . Hiatal hernia   . Hypertension    Past Surgical History  Procedure Date  . Carpal tunnel release     bilateral  . Total abdominal hysterectomy     reports that she has never smoked. She does not have any smokeless tobacco history on file. She reports that she does not drink alcohol or use illicit drugs. family history includes Asthma in her brother and unspecified family member; Cancer in her sister; Colon cancer in her father; and Heart attack in her mother. Allergies  Allergen Reactions  . Penicillins     REACTION: unspecified   Current Outpatient Prescriptions on File Prior to Visit  Medication Sig Dispense Refill  . albuterol (PROVENTIL HFA) 108 (90 BASE) MCG/ACT inhaler Inhale 2 puffs into the lungs every 4 (four) hours as needed.        Marland Kitchen albuterol (PROVENTIL) (2.5 MG/3ML) 0.083% nebulizer solution Take 2.5 mg by nebulization every 4 (four) hours as needed.        Marland Kitchen aspirin 81 MG tablet Take 81 mg by mouth daily.        . budesonide-formoterol (SYMBICORT) 160-4.5 MCG/ACT inhaler Inhale 2 puffs into the lungs 2 (  two) times daily.        . cyclobenzaprine (FLEXERIL) 5 MG tablet Take 5 mg by mouth 3 (three) times daily as needed.        . fexofenadine (ALLEGRA) 180 MG tablet Take 180 mg by mouth daily as needed.        . fluticasone (FLONASE) 50 MCG/ACT nasal spray 2 sprays by Nasal route 2 (two) times daily.        . naproxen sodium (ANAPROX) 220 MG tablet As needed       . omeprazole (PRILOSEC) 20 MG capsule Take 20 mg by mouth 2 (two) times daily.        . pravastatin (PRAVACHOL) 40 MG tablet Take 40 mg by mouth daily.        . solifenacin (VESICARE) 5 MG tablet Take 10 mg by mouth daily.         Review of Systems Review of  Systems  Constitutional: Negative for diaphoresis and unexpected weight change.  HENT: Negative for drooling and tinnitus.   Eyes: Negative for photophobia and visual disturbance.  Respiratory: Negative for choking and stridor.   Gastrointestinal: Negative for vomiting and blood in stool.  Genitourinary: Negative for hematuria and decreased urine volume.      Objective:   Physical Exam BP 112/78  Pulse 91  Temp(Src) 98.3 F (36.8 C) (Oral)  Ht 5\' 2"  (1.575 m)  Wt 204 lb 4 oz (92.647 kg)  BMI 37.36 kg/m2  SpO2 96% Physical Exam  VS noted, obese Constitutional: Pt appears well-developed and well-nourished.  HENT: Head: Normocephalic.  Right Ear: External ear normal.  Left Ear: External ear normal.  Eyes: Conjunctivae and EOM are normal. Pupils are equal, round, and reactive to light.  Neck: Normal range of motion. Neck supple.  Cardiovascular: Normal rate and regular rhythm.   Pulmonary/Chest: Effort normal and breath sounds normal.  Abd:  Soft, NT, non-distended, + BS Neurological: Pt is alert. No cranial nerve deficit. Motor/sens/dtr intact to LE's  Skin: Skin is warm. No erythema.  Psychiatric: Pt behavior is normal. Thought content normal. mild nervous at best        Assessment & Plan:

## 2011-07-15 ENCOUNTER — Telehealth: Payer: Self-pay

## 2011-07-15 ENCOUNTER — Other Ambulatory Visit: Payer: Medicare Other

## 2011-07-15 ENCOUNTER — Other Ambulatory Visit (INDEPENDENT_AMBULATORY_CARE_PROVIDER_SITE_OTHER): Payer: Medicare Other

## 2011-07-15 DIAGNOSIS — D473 Essential (hemorrhagic) thrombocythemia: Secondary | ICD-10-CM

## 2011-07-15 LAB — CBC WITH DIFFERENTIAL/PLATELET
Basophils Relative: 0.6 % (ref 0.0–3.0)
Eosinophils Absolute: 0.2 10*3/uL (ref 0.0–0.7)
Eosinophils Relative: 2.4 % (ref 0.0–5.0)
HCT: 40.1 % (ref 36.0–46.0)
Lymphs Abs: 2 10*3/uL (ref 0.7–4.0)
MCHC: 32.1 g/dL (ref 30.0–36.0)
MCV: 70.1 fl — ABNORMAL LOW (ref 78.0–100.0)
Monocytes Absolute: 0.6 10*3/uL (ref 0.1–1.0)
Neutrophils Relative %: 68.7 % (ref 43.0–77.0)
Platelets: 724 10*3/uL — ABNORMAL HIGH (ref 150.0–400.0)
RBC: 5.73 Mil/uL — ABNORMAL HIGH (ref 3.87–5.11)
WBC: 9.2 10*3/uL (ref 4.5–10.5)

## 2011-07-15 NOTE — Telephone Encounter (Signed)
A user error has taken place: encounter opened in error, closed for administrative reasons.

## 2011-07-19 ENCOUNTER — Ambulatory Visit
Admission: RE | Admit: 2011-07-19 | Discharge: 2011-07-19 | Disposition: A | Discharge status: Home / Self Care | Payer: Medicare Other | Source: Ambulatory Visit | Attending: Internal Medicine | Admitting: Internal Medicine

## 2011-07-19 DIAGNOSIS — M5416 Radiculopathy, lumbar region: Secondary | ICD-10-CM

## 2011-07-19 DIAGNOSIS — R55 Syncope and collapse: Secondary | ICD-10-CM

## 2011-07-20 ENCOUNTER — Encounter: Payer: Self-pay | Admitting: Internal Medicine

## 2011-07-20 DIAGNOSIS — M47816 Spondylosis without myelopathy or radiculopathy, lumbar region: Secondary | ICD-10-CM

## 2011-07-20 DIAGNOSIS — M51369 Other intervertebral disc degeneration, lumbar region without mention of lumbar back pain or lower extremity pain: Secondary | ICD-10-CM

## 2011-07-20 DIAGNOSIS — M5136 Other intervertebral disc degeneration, lumbar region: Secondary | ICD-10-CM

## 2011-07-20 DIAGNOSIS — I639 Cerebral infarction, unspecified: Secondary | ICD-10-CM | POA: Insufficient documentation

## 2011-07-20 HISTORY — DX: Spondylosis without myelopathy or radiculopathy, lumbar region: M47.816

## 2011-07-20 HISTORY — DX: Other intervertebral disc degeneration, lumbar region: M51.36

## 2011-07-20 HISTORY — DX: Other intervertebral disc degeneration, lumbar region without mention of lumbar back pain or lower extremity pain: M51.369

## 2011-07-22 ENCOUNTER — Other Ambulatory Visit (HOSPITAL_COMMUNITY): Payer: Medicare Other | Admitting: Radiology

## 2011-07-29 ENCOUNTER — Encounter: Payer: Medicare Other | Admitting: Cardiology

## 2011-08-01 ENCOUNTER — Ambulatory Visit: Payer: Medicare Other | Admitting: Cardiovascular Disease

## 2011-08-04 ENCOUNTER — Other Ambulatory Visit: Payer: Self-pay | Admitting: Cardiology

## 2011-08-04 DIAGNOSIS — R55 Syncope and collapse: Secondary | ICD-10-CM

## 2011-08-05 ENCOUNTER — Encounter (INDEPENDENT_AMBULATORY_CARE_PROVIDER_SITE_OTHER): Payer: Medicare Other | Admitting: *Deleted

## 2011-08-05 ENCOUNTER — Ambulatory Visit (HOSPITAL_COMMUNITY): Payer: Medicare Other | Attending: Internal Medicine

## 2011-08-05 DIAGNOSIS — J45909 Unspecified asthma, uncomplicated: Secondary | ICD-10-CM | POA: Insufficient documentation

## 2011-08-05 DIAGNOSIS — R42 Dizziness and giddiness: Secondary | ICD-10-CM

## 2011-08-05 DIAGNOSIS — I059 Rheumatic mitral valve disease, unspecified: Secondary | ICD-10-CM | POA: Insufficient documentation

## 2011-08-05 DIAGNOSIS — R55 Syncope and collapse: Secondary | ICD-10-CM | POA: Insufficient documentation

## 2011-08-05 DIAGNOSIS — I079 Rheumatic tricuspid valve disease, unspecified: Secondary | ICD-10-CM | POA: Insufficient documentation

## 2011-08-05 DIAGNOSIS — E119 Type 2 diabetes mellitus without complications: Secondary | ICD-10-CM | POA: Insufficient documentation

## 2011-08-05 DIAGNOSIS — E785 Hyperlipidemia, unspecified: Secondary | ICD-10-CM | POA: Insufficient documentation

## 2011-08-09 ENCOUNTER — Ambulatory Visit (INDEPENDENT_AMBULATORY_CARE_PROVIDER_SITE_OTHER): Payer: Medicare Other | Admitting: Cardiovascular Disease

## 2011-08-09 ENCOUNTER — Encounter: Payer: Self-pay | Admitting: Cardiovascular Disease

## 2011-08-09 DIAGNOSIS — E785 Hyperlipidemia, unspecified: Secondary | ICD-10-CM

## 2011-08-09 DIAGNOSIS — R0609 Other forms of dyspnea: Secondary | ICD-10-CM

## 2011-08-09 DIAGNOSIS — R55 Syncope and collapse: Secondary | ICD-10-CM

## 2011-08-09 DIAGNOSIS — R0602 Shortness of breath: Secondary | ICD-10-CM

## 2011-08-09 DIAGNOSIS — R0600 Dyspnea, unspecified: Secondary | ICD-10-CM

## 2011-08-09 DIAGNOSIS — R0989 Other specified symptoms and signs involving the circulatory and respiratory systems: Secondary | ICD-10-CM

## 2011-08-09 NOTE — Assessment & Plan Note (Signed)
Related to asthma.  Normal exam and echo  Continue inhalers

## 2011-08-09 NOTE — Patient Instructions (Signed)
Your physician has requested that you have en exercise stress myoview. For further information please visit [email or web address]. Please follow instruction sheet, as given.   

## 2011-08-09 NOTE — Progress Notes (Signed)
65 yo of Dr Jonny Ruiz referred for dyspnea and syncope.  No previous cardiac history.  Last two months has had 3-4 episodes of dizzyness.  Not positional Can occur sitting.  Not related to exertion.  Gets "dizzyheaded" and goes 'out" for a second.  No trauma.  No palpitations or SSCP.  Reviewed ECG from 8/7 normal.  Reviewed echo 8/31 normal with EF 60%.  No previous history of othostasis and not orthostatic on review of vitals from primary visits.  Has chronic dyspnea from asthma.  Uses inhalers.  No recent prednisone.  Quit smoking a long time ago.  CRF;s family history and elevated lipids on Rx  ROS: Denies fever, malais, weight loss, blurry vision, decreased visual acuity, cough, sputum, SOB, hemoptysis, pleuritic pain, palpitaitons, heartburn, abdominal pain, melena, lower extremity edema, claudication, or rash.  All other systems reviewed and negative   General: Affect appropriate Healthy:  appears stated age HEENT: normal Neck supple with no adenopathy JVP normal no bruits no thyromegaly Lungs clear with no wheezing and good diaphragmatic motion Heart:  S1/S2 no murmur,rub, gallop or click PMI normal Abdomen: benighn, BS positve, no tenderness, no AAA no bruit.  No HSM or HJR Distal pulses intact with no bruits No edema Neuro non-focal Skin warm and dry No muscular weakness  Medications Current Outpatient Prescriptions  Medication Sig Dispense Refill  . albuterol (PROVENTIL HFA) 108 (90 BASE) MCG/ACT inhaler Inhale 2 puffs into the lungs every 4 (four) hours as needed.        Marland Kitchen albuterol (PROVENTIL) (2.5 MG/3ML) 0.083% nebulizer solution Take 2.5 mg by nebulization every 4 (four) hours as needed.        Marland Kitchen aspirin 81 MG tablet Take 81 mg by mouth daily.        . budesonide-formoterol (SYMBICORT) 160-4.5 MCG/ACT inhaler Inhale 2 puffs into the lungs 2 (two) times daily.        . cyclobenzaprine (FLEXERIL) 5 MG tablet Take 5 mg by mouth 3 (three) times daily as needed.        .  fexofenadine (ALLEGRA) 180 MG tablet Take 180 mg by mouth daily as needed.        . fluticasone (FLONASE) 50 MCG/ACT nasal spray 2 sprays by Nasal route 2 (two) times daily.        . naproxen sodium (ANAPROX) 220 MG tablet As needed       . omeprazole (PRILOSEC) 20 MG capsule Take 20 mg by mouth 2 (two) times daily.        . pravastatin (PRAVACHOL) 40 MG tablet Take 40 mg by mouth daily.          Allergies Penicillins  Family History: Family History  Problem Relation Age of Onset  . Cancer Sister   . Heart attack Mother   . Asthma Brother   . Asthma      uncle  . Colon cancer Father     Social History: History   Social History  . Marital Status: Married    Spouse Name: N/A    Number of Children: N/A  . Years of Education: N/A   Occupational History  . retired    Social History Main Topics  . Smoking status: Former Smoker    Quit date: 08/08/1981  . Smokeless tobacco: Not on file  . Alcohol Use: No  . Drug Use: No  . Sexually Active: Not on file   Other Topics Concern  . Not on file   Social History Narrative  .  No narrative on file    Electrocardiogram:  NSR 83 normal ECG done 07/12/11  Assessment and Plan

## 2011-08-09 NOTE — Assessment & Plan Note (Signed)
No frank syncope.  History sounds more like dizzyness or ENT issue.  With DM and dyspnea will have her do ETT with myovue to make sure hemodynamics normal and no ischemia

## 2011-08-09 NOTE — Assessment & Plan Note (Signed)
Cholesterol is at goal.  Continue current dose of statin and diet Rx.  No myalgias or side effects.  F/U  LFT's in 6 months. Lab Results  Component Value Date   LDLCALC 89 07/05/2010

## 2011-08-10 ENCOUNTER — Encounter: Payer: Self-pay | Admitting: *Deleted

## 2011-08-17 ENCOUNTER — Encounter (HOSPITAL_COMMUNITY): Payer: Medicare Other | Admitting: Radiology

## 2011-08-18 ENCOUNTER — Ambulatory Visit (HOSPITAL_COMMUNITY): Payer: Medicare Other | Attending: Cardiovascular Disease | Admitting: Radiology

## 2011-08-18 DIAGNOSIS — R55 Syncope and collapse: Secondary | ICD-10-CM

## 2011-08-18 DIAGNOSIS — R0602 Shortness of breath: Secondary | ICD-10-CM

## 2011-08-18 DIAGNOSIS — R0989 Other specified symptoms and signs involving the circulatory and respiratory systems: Secondary | ICD-10-CM

## 2011-08-18 DIAGNOSIS — R079 Chest pain, unspecified: Secondary | ICD-10-CM | POA: Insufficient documentation

## 2011-08-18 DIAGNOSIS — R0789 Other chest pain: Secondary | ICD-10-CM

## 2011-08-18 MED ORDER — TECHNETIUM TC 99M TETROFOSMIN IV KIT
11.0000 | PACK | Freq: Once | INTRAVENOUS | Status: AC | PRN
  Administered 2011-08-18: 11 via INTRAVENOUS

## 2011-08-18 MED ORDER — REGADENOSON 0.4 MG/5ML IV SOLN
0.4000 mg | Freq: Once | INTRAVENOUS | Status: AC
  Administered 2011-08-18: 0.4 mg via INTRAVENOUS

## 2011-08-18 MED ORDER — TECHNETIUM TC 99M TETROFOSMIN IV KIT
33.0000 | PACK | Freq: Once | INTRAVENOUS | Status: AC | PRN
  Administered 2011-08-18: 33 via INTRAVENOUS

## 2011-08-18 NOTE — Progress Notes (Signed)
[facility] SITE 3 NUCLEAR MED [address] [place] [address] [phone]  Cardiology Nuclear Med Study  [name] is a 65 y.o. female [id number] [birth year]   Nuclear Med Background Indication for Stress Test:  Evaluation for Ischemia History:  '07 IHK:[id number], EF=83%; 8/12 Echo:EF=60% Cardiac Risk Factors: CVA, Family History - CAD, History of Smoking, Lipids, NIDDM and Obesity  Symptoms:  Chest Pressure.  (last episode of chest discomfort was yesterday), Dizziness, DOE/SOB, Fatigue, and Syncope   Nuclear Pre-Procedure Caffeine/Decaff Intake:  None NPO After: 7:00pm   Lungs:  Moderate expiratory wheezes throughout, cleared with Albuterol inhaler.  O2 Sat 97% on RA. IV 0.9% NS with Angio Cath:  20g  IV Site: R Antecubital  IV Started by:  Stanton Kidney, EMT-P  Chest Size (in):  40 Cup Size: C  Height: 5\' 2"  (1.575 m)  Weight:  203 lb (92.08 kg)  BMI:  Body mass index is 37.13 kg/(m^2). Tech Comments:  NA    Nuclear Med Study 1 or 2 day study: 1 day  Stress Test Type:  Lexiscan  Reading MD: Cassell Clement, MD  Order Authorizing Provider:  Charlton Haws, MD  Resting Radionuclide: Technetium 60m Tetrofosmin  Resting Radionuclide Dose: 11.0 mCi   Stress Radionuclide:  Technetium 28m Tetrofosmin  Stress Radionuclide Dose: 33.0 mCi           Stress Protocol Rest HR: 67 Stress HR: 108  Rest BP: 132/70 Stress BP: 141/69  Exercise Time (min): n/a METS: n/a   Predicted Max HR: 154 bpm % Max HR: 70.13 bpm Rate Pressure Product: 63875   Dose of Adenosine (mg):  n/a Dose of Lexiscan: 0.4 mg  Dose of Atropine (mg): n/a Dose of Dobutamine: n/a mcg/kg/min (at max HR)  Stress Test Technologist: Smiley Houseman, CMA-N  Nuclear Technologist:  Domenic Polite, CNMT     Rest Procedure:  Myocardial perfusion imaging was performed at rest 45 minutes following the intravenous administration of Technetium 37m Tetrofosmin.  Rest ECG: No acute changes.  Stress  Procedure: The patient was initially scheduled to walk the treadmill utilizing the Bruce protocol, but d/t a history of asthma and the moderate expiratory wheezes throughout at the time of her test, she was changed to [place].   The patient received IV Lexiscan 0.4 mg over 15-seconds.  Technetium 5m Tetrofosmin injected at 30-seconds.  There were no significant changes with Lexiscan.  Quantitative spect images were obtained after a 45 minute delay.  Stress ECG: No significant ST segment change suggestive of ischemia.  QPS Raw Data Images:  Normal; no motion artifact; normal heart/lung ratio. Stress Images:  Normal homogeneous uptake in all areas of the myocardium. Rest Images:  Normal homogeneous uptake in all areas of the myocardium. Subtraction (SDS):  No evidence of ischemia. Transient Ischemic Dilatation (Normal <1.22):  1.08 Lung/Heart Ratio (Normal <0.45):  0.32  Quantitative Gated Spect Images QGS EDV:  70 ml QGS ESV:  15 ml QGS cine images:  NL LV Function; NL Wall Motion QGS EF: 79%  Impression Exercise Capacity:  Lexiscan with no exercise. BP Response:  Normal blood pressure response. Clinical Symptoms:  No chest pain. ECG Impression:  No significant ST segment change suggestive of ischemia. Comparison with Prior Nuclear Study: No significant change from previous study  Overall Impression:  Normal stress nuclear study.    Cassell Clement

## 2011-08-20 ENCOUNTER — Other Ambulatory Visit: Payer: Self-pay | Admitting: Internal Medicine

## 2011-09-02 LAB — URINALYSIS, ROUTINE W REFLEX MICROSCOPIC
Bilirubin Urine: NEGATIVE
Ketones, ur: NEGATIVE
Nitrite: NEGATIVE
Protein, ur: NEGATIVE
pH: 6

## 2011-09-02 LAB — POCT CARDIAC MARKERS
Myoglobin, poc: 65.8
Operator id: 264421
Operator id: 264421
Troponin i, poc: <0.05
Troponin i, poc: <0.05

## 2011-09-02 LAB — CARDIAC PANEL(CRET KIN+CKTOT+MB+TROPI)
CK, MB: 1.1
Relative Index: INVALID
Relative Index: INVALID
Total CK: 53

## 2011-09-02 LAB — DIFFERENTIAL
Basophils Relative: 0
Eosinophils Relative: 4
Lymphocytes Relative: 21
Lymphs Abs: 2.2
Monocytes Relative: 8
Neutro Abs: 6.9

## 2011-09-02 LAB — LIPID PANEL
LDL Cholesterol: 119 — ABNORMAL HIGH
Total CHOL/HDL Ratio: 3.4
Triglycerides: 80
VLDL: 16

## 2011-09-02 LAB — URINE MICROSCOPIC-ADD ON

## 2011-09-02 LAB — CBC
HCT: 40.4
Hemoglobin: 13
WBC: 10.3

## 2011-09-02 LAB — POCT I-STAT, CHEM 8
BUN: 15
Calcium, Ion: 1.16
Chloride: 107
Creatinine, Ser: 0.9
Glucose, Bld: 96
TCO2: 24

## 2011-09-02 LAB — D-DIMER, QUANTITATIVE: D-Dimer, Quant: 0.58 — ABNORMAL HIGH

## 2011-11-09 ENCOUNTER — Other Ambulatory Visit: Payer: Self-pay | Admitting: Emergency Medicine

## 2011-11-09 NOTE — Telephone Encounter (Signed)
RX sent

## 2011-11-10 ENCOUNTER — Encounter: Payer: Self-pay | Admitting: Emergency Medicine

## 2011-11-10 ENCOUNTER — Ambulatory Visit (INDEPENDENT_AMBULATORY_CARE_PROVIDER_SITE_OTHER): Payer: Medicare Other | Admitting: Emergency Medicine

## 2011-11-10 DIAGNOSIS — J383 Other diseases of vocal cords: Secondary | ICD-10-CM

## 2011-11-10 DIAGNOSIS — K219 Gastro-esophageal reflux disease without esophagitis: Secondary | ICD-10-CM

## 2011-11-10 DIAGNOSIS — Z23 Encounter for immunization: Secondary | ICD-10-CM

## 2011-11-10 DIAGNOSIS — J309 Allergic rhinitis, unspecified: Secondary | ICD-10-CM

## 2011-11-10 DIAGNOSIS — J45909 Unspecified asthma, uncomplicated: Secondary | ICD-10-CM

## 2011-11-10 MED ORDER — OMEPRAZOLE 20 MG PO CPDR: 20.0000 mg | DELAYED_RELEASE_CAPSULE | Freq: Two times a day (BID) | ORAL | Status: DC

## 2011-11-10 NOTE — Progress Notes (Signed)
Subjective:    Patient ID: [name], female    DOB: 04-25-1946, 65 y.o.   MRN: [id number] HPI 12 -year-old woman with a history of asthma and vocal cord dysfunction both of which have been significantly impacted by severe esophageal reflux. Has a hx esophageal stricture dilations by Dr Marina Goodell. On omeprazole 20mg  two times a day. Uses Symbicort bid.   ROV 05/20/10 -- regular f/u for VCD and asthma. No problems since last visit. Has had good control of both her GERD and UA irritation right now. Takes Symbicort reliably. Has been walking, has lost 9 lbs since last visit. Mentions today that she has a nevus on her chest that has become painful when her clothes rub against it.   ROV 09/07/10 -- f/u for allergies, GERD, VCD, asthma. Gained back 4 lbs. For the last 3 weeks more trouble with SOB and UA wheezing. Using SABA 1x a day. Taking Symbicort two times a day. She was recently started on clonidine in August, she questions whether this is a factor in her SOB. Occas globus sensation. Freq cough. No real heartburn symptoms. Occas gets cough and irritation from using symbicort.   ROV 10/08/10 -- allergies, GERD, VCD > asthma. Returns for f/u. last time we stopped clonidine to see if it would help her breathing - no change. Has continued cough, UA wheezing, throat clearing, tickle in her throat. Swallowing is ok. She has globus sensation, but doesn't clear phlegm. Reliable about her omeprazole two times a day.   11/18/10 -- VCD, GERD, allergies, probable coexisting asthma. Returns for f/u. Last time restarted loratadine, fluticasone. She was already on omeprazole. Her breathing is better. Today she mentions some dizziness, no syncope, can happen with sitting or standing. The room spins. No CP, no dyspnea, no palpitations. Remains off clonidine, stopped in October to see if it would help breathing. BP is OK (she was on it for sweats).   ROV 05/11/11 -- Hx of VCD and asthma that have been exacerbated by GERD  and allergic rhinitis. She returns reporting more DOE over about the last 5 -6 months. Her wt has been stable at 206. She is not having any throat symptoms, has a strong voice. Rare SABA use, but she did need it more frequently in March. She is taking her fexofenadine, fluticasone bid, omeprazole. Symbicort bid.   ROV 06/06/2011  Presents for an acute office visit. Complains of increased SOB, wheezing, prod cough with yellow mucus x 3days. Wheezing is getting worse today. She is under a lot of stress, tired a lot . Husband had stroke 3 years ago, requires total care- at home by her.  OTC not helping. Has some drainage.   ROV 11/10/11 -- Hx of VCD and asthma that have been exacerbated by GERD and allergic rhinitis. Returns for f/u. Since last visit she had a normal nuclear cardiac stress test. She tells me that she had an episode last week when she needed her albuterol more - ? After exposure to perfume.  Still uses Symbicort bid, albuterol prn. Her voice has been strong, taking her allergy regimen and PPI. Needs Flu shot.   Objective:   Physical Exam  Gen: Pleasant, overwt, in no distress,  normal affect, NAD  ENT: No lesions,  mouth clear,  oropharynx clear, no postnasal drip  Neck: very mild exp stridor, not as severe as some prior exams  Lungs coarse BS w/ exp wheezing, talking in full sentences.   Cardiovascular: RRR, heart sounds normal, no murmur  or gallops, no peripheral edema  Musculoskeletal: No deformities, no cyanosis or clubbing  Neuro: alert, non focal  Skin: Warm, no lesions or rashes      Assessment & Plan:    ALLERGIC RHINITIS Continue current regimen  ASTHMA, MILD Symbicort + prn SABA  Esophageal reflux Need to refill her PPI  VOCAL CORD DISORDER stable

## 2011-11-10 NOTE — Assessment & Plan Note (Signed)
Need to refill her PPI

## 2011-11-10 NOTE — Assessment & Plan Note (Signed)
Symbicort + prn SABA

## 2011-11-10 NOTE — Assessment & Plan Note (Signed)
stable °

## 2011-11-10 NOTE — Assessment & Plan Note (Signed)
Continue current regimen

## 2011-11-10 NOTE — Patient Instructions (Addendum)
Flu Shot today Continue your Symbicort and albuterol nebulizer as you are using them Continue your allegra and flonase Continue your omeprazole twice a day.  Follow with Dr Delton Coombes in 6 months or sooner if you have any problems.

## 2012-01-12 ENCOUNTER — Ambulatory Visit: Payer: Medicare Other | Admitting: Internal Medicine

## 2012-02-09 ENCOUNTER — Encounter: Payer: Self-pay | Admitting: Internal Medicine

## 2012-04-16 ENCOUNTER — Encounter: Payer: Self-pay | Admitting: Adult Health

## 2012-04-16 ENCOUNTER — Ambulatory Visit (INDEPENDENT_AMBULATORY_CARE_PROVIDER_SITE_OTHER): Payer: Medicare Other | Admitting: Adult Health

## 2012-04-16 VITALS — BP 124/66 | HR 102 | Temp 98.2°F | Ht 62.0 in | Wt 200.8 lb

## 2012-04-16 DIAGNOSIS — J45909 Unspecified asthma, uncomplicated: Secondary | ICD-10-CM

## 2012-04-16 MED ORDER — PREDNISONE 10 MG PO TABS: ORAL_TABLET | ORAL | Status: DC

## 2012-04-16 MED ORDER — HYDROCODONE-HOMATROPINE 5-1.5 MG/5ML PO SYRP: 5.0000 mL | ORAL_SOLUTION | Freq: Four times a day (QID) | ORAL | Status: AC | PRN

## 2012-04-16 NOTE — Patient Instructions (Signed)
Mucinex DM Twice daily  As needed  Cough  Hydromet 1-2 tsp every 4-6 hr As needed  Cough/congestion  Fluids and rest  Prednisone taper over next week.  follow up Dr. Delton Coombes  In 6 weeks and As needed   Please contact office for sooner follow up if symptoms do not improve or worsen or seek emergency care

## 2012-04-16 NOTE — Progress Notes (Signed)
Subjective:    Patient ID: [name], female    DOB: Nov 08, 1946, 66 y.o.   MRN: [id number] HPI 66 -year-old woman with a history of asthma and vocal cord dysfunction both of which have been significantly impacted by severe esophageal reflux. Has a hx esophageal stricture dilations by Dr Marina Goodell. On omeprazole 20mg  two times a day. Uses Symbicort bid.   ROV 05/20/10 -- regular f/u for VCD and asthma. No problems since last visit. Has had good control of both her GERD and UA irritation right now. Takes Symbicort reliably. Has been walking, has lost 9 lbs since last visit. Mentions today that she has a nevus on her chest that has become painful when her clothes rub against it.   ROV 09/07/10 -- f/u for allergies, GERD, VCD, asthma. Gained back 4 lbs. For the last 3 weeks more trouble with SOB and UA wheezing. Using SABA 1x a day. Taking Symbicort two times a day. She was recently started on clonidine in August, she questions whether this is a factor in her SOB. Occas globus sensation. Freq cough. No real heartburn symptoms. Occas gets cough and irritation from using symbicort.   ROV 10/08/10 -- allergies, GERD, VCD > asthma. Returns for f/u. last time we stopped clonidine to see if it would help her breathing - no change. Has continued cough, UA wheezing, throat clearing, tickle in her throat. Swallowing is ok. She has globus sensation, but doesn't clear phlegm. Reliable about her omeprazole two times a day.   11/18/10 -- VCD, GERD, allergies, probable coexisting asthma. Returns for f/u. Last time restarted loratadine, fluticasone. She was already on omeprazole. Her breathing is better. Today she mentions some dizziness, no syncope, can happen with sitting or standing. The room spins. No CP, no dyspnea, no palpitations. Remains off clonidine, stopped in October to see if it would help breathing. BP is OK (she was on it for sweats).   ROV 05/11/11 -- Hx of VCD and asthma that have been exacerbated by GERD  and allergic rhinitis. She returns reporting more DOE over about the last 5 -6 months. Her wt has been stable at 206. She is not having any throat symptoms, has a strong voice. Rare SABA use, but she did need it more frequently in March. She is taking her fexofenadine, fluticasone bid, omeprazole. Symbicort bid.   ROV 06/06/2011  Presents for an acute office visit. Complains of increased SOB, wheezing, prod cough with yellow mucus x 3days. Wheezing is getting worse today. She is under a lot of stress, tired a lot . Husband had stroke 3 years ago, requires total care- at home by her.  OTC not helping. Has some drainage.   ROV 11/10/11 -- Hx of VCD and asthma that have been exacerbated by GERD and allergic rhinitis. Returns for f/u. Since last visit she had a normal nuclear cardiac stress test. She tells me that she had an episode last week when she needed her albuterol more - ? After exposure to perfume.  Still uses Symbicort bid, albuterol prn. Her voice has been strong, taking her allergy regimen and PPI. Needs Flu shot.   Acute OV 04/16/2012  Complains increased SOB, wheezing, tightness in chest, dry cough, chest congestion x3 days.  Increased wheezing and dyspnea . Using her albuterol neb more over last 3 days.  No fever or discolored. No recent travel or abx use.  Has been doing well until last few days. No ER or Hospital visit. Rare use of SABA until  this illness.  Under some stress with sick husband  ROS:  Constitutional:   No  weight loss, night sweats,  Fevers, chills, +fatigue, or  lassitude.  HEENT:   No headaches,  Difficulty swallowing,  Tooth/dental problems, or  Sore throat,                No sneezing, itching, ear ache,  +nasal congestion, post nasal drip,   CV:  No chest pain,  Orthopnea, PND, swelling in lower extremities, anasarca, dizziness, palpitations, syncope.   GI  No heartburn, indigestion, abdominal pain, nausea, vomiting, diarrhea, change in bowel habits, loss of  appetite, bloody stools.   Resp:   No coughing up of blood.    No chest wall deformity  Skin: no rash or lesions.  GU: no dysuria, change in color of urine, no urgency or frequency.  No flank pain, no hematuria   MS:  No joint pain or swelling.  No decreased range of motion.  No back pain.  Psych:  No change in mood or affect. No depression or anxiety.  No memory loss.       Objective:   Physical Exam  Gen: Pleasant, overwt, in no distress,  normal affect, NAD  ENT: No lesions,  mouth clear,  oropharynx clear, no postnasal drip  Neck: supple no JVD   Lungs coarse BS , exp wheezes   Cardiovascular: RRR, heart sounds normal, no murmur or gallops, no peripheral edema  Musculoskeletal: No deformities, no cyanosis or clubbing  Neuro: alert, non focal  Skin: Warm, no lesions or rashes      Assessment & Plan:

## 2012-04-16 NOTE — Assessment & Plan Note (Signed)
Flare   Plan;  Mucinex DM Twice daily  As needed  Cough  Hydromet 1-2 tsp every 4-6 hr As needed  Cough/congestion  Fluids and rest  Prednisone taper over next week.  follow up Dr. Delton Coombes  In 6 weeks and As needed   Please contact office for sooner follow up if symptoms do not improve or worsen or seek emergency care

## 2012-04-20 ENCOUNTER — Other Ambulatory Visit: Payer: Self-pay | Admitting: Emergency Medicine

## 2012-05-28 ENCOUNTER — Ambulatory Visit: Payer: Medicare Other | Admitting: Internal Medicine

## 2012-05-28 ENCOUNTER — Observation Stay (HOSPITAL_COMMUNITY)
Admission: EM | Admit: 2012-05-28 | Discharge: 2012-05-29 | Disposition: A | Discharge status: Home / Self Care | Payer: Medicare Other | Attending: Emergency Medicine | Admitting: Emergency Medicine

## 2012-05-28 ENCOUNTER — Encounter (HOSPITAL_COMMUNITY): Payer: Self-pay | Admitting: *Deleted

## 2012-05-28 ENCOUNTER — Emergency Department (HOSPITAL_COMMUNITY): Payer: Medicare Other

## 2012-05-28 ENCOUNTER — Telehealth: Payer: Self-pay | Admitting: Internal Medicine

## 2012-05-28 DIAGNOSIS — R42 Dizziness and giddiness: Secondary | ICD-10-CM | POA: Insufficient documentation

## 2012-05-28 DIAGNOSIS — R079 Chest pain, unspecified: Principal | ICD-10-CM | POA: Insufficient documentation

## 2012-05-28 DIAGNOSIS — E785 Hyperlipidemia, unspecified: Secondary | ICD-10-CM | POA: Insufficient documentation

## 2012-05-28 DIAGNOSIS — R0602 Shortness of breath: Secondary | ICD-10-CM | POA: Insufficient documentation

## 2012-05-28 DIAGNOSIS — E119 Type 2 diabetes mellitus without complications: Secondary | ICD-10-CM | POA: Insufficient documentation

## 2012-05-28 DIAGNOSIS — K219 Gastro-esophageal reflux disease without esophagitis: Secondary | ICD-10-CM | POA: Insufficient documentation

## 2012-05-28 DIAGNOSIS — J45909 Unspecified asthma, uncomplicated: Secondary | ICD-10-CM

## 2012-05-28 DIAGNOSIS — I10 Essential (primary) hypertension: Secondary | ICD-10-CM | POA: Insufficient documentation

## 2012-05-28 LAB — BASIC METABOLIC PANEL
BUN: 11 mg/dL (ref 6–23)
CO2: 27 mEq/L (ref 19–32)
Calcium: 9.6 mg/dL (ref 8.4–10.5)
Chloride: 106 mEq/L (ref 96–112)
Creatinine, Ser: 0.85 mg/dL (ref 0.50–1.10)
GFR calc Af Amer: 80 mL/min — ABNORMAL LOW (ref >90)
GFR calc non Af Amer: 69 mL/min — ABNORMAL LOW (ref >90)
Glucose, Bld: 79 mg/dL (ref 70–99)
Potassium: 4 mEq/L (ref 3.5–5.1)
Sodium: 142 mEq/L (ref 135–145)

## 2012-05-28 LAB — CBC
HCT: 42.2 % (ref 36.0–46.0)
Hemoglobin: 13.7 g/dL (ref 12.0–15.0)
MCH: 22.1 pg — ABNORMAL LOW (ref 26.0–34.0)
MCHC: 32.5 g/dL (ref 30.0–36.0)
MCV: 68.1 fL — ABNORMAL LOW (ref 78.0–100.0)
Platelets: 621 10*3/uL — ABNORMAL HIGH (ref 150–400)
RBC: 6.2 MIL/uL — ABNORMAL HIGH (ref 3.87–5.11)
RDW: 16.3 % — ABNORMAL HIGH (ref 11.5–15.5)
WBC: 8.5 10*3/uL (ref 4.0–10.5)

## 2012-05-28 LAB — POCT I-STAT TROPONIN I: Troponin i, poc: 0 ng/mL (ref 0.00–0.08)

## 2012-05-28 LAB — TROPONIN I: Troponin I: <0.3 ng/mL (ref <0.30)

## 2012-05-28 MED ORDER — ALBUTEROL SULFATE (5 MG/ML) 0.5% IN NEBU
5.0000 mg | INHALATION_SOLUTION | Freq: Once | RESPIRATORY_TRACT | Status: AC
  Administered 2012-05-28: 5 mg via RESPIRATORY_TRACT
  Filled 2012-05-28: qty 1

## 2012-05-28 MED ORDER — IPRATROPIUM BROMIDE 0.02 % IN SOLN
0.5000 mg | Freq: Once | RESPIRATORY_TRACT | Status: AC
  Administered 2012-05-28: 0.5 mg via RESPIRATORY_TRACT
  Filled 2012-05-28: qty 2.5

## 2012-05-28 MED ORDER — MORPHINE SULFATE 4 MG/ML IJ SOLN
4.0000 mg | Freq: Once | INTRAMUSCULAR | Status: AC
  Administered 2012-05-28: 4 mg via INTRAVENOUS
  Filled 2012-05-28: qty 1

## 2012-05-28 MED ORDER — ALBUTEROL SULFATE (5 MG/ML) 0.5% IN NEBU
5.0000 mg | INHALATION_SOLUTION | Freq: Once | RESPIRATORY_TRACT | Status: AC
  Administered 2012-05-28: 5 mg via RESPIRATORY_TRACT
  Filled 2012-05-28: qty 40

## 2012-05-28 MED ORDER — IBUPROFEN 200 MG PO TABS
ORAL_TABLET | ORAL | Status: AC
  Administered 2012-05-28: 600 mg
  Filled 2012-05-28: qty 3

## 2012-05-28 MED ORDER — PREDNISONE 20 MG PO TABS
60.0000 mg | ORAL_TABLET | Freq: Once | ORAL | Status: AC
  Administered 2012-05-28: 60 mg via ORAL
  Filled 2012-05-28: qty 3

## 2012-05-28 NOTE — Telephone Encounter (Signed)
Caller: [name]/Patient; PCP: Oliver Barre; CB#: [phone]; ; ; Call regarding Headache;  Pt calling with complaints  of HA since 05/26/2012- has taken Aleve but hasnt helped. HA has been so bad that at times has  had  visual changes and feels faint . Rating  10/10  during call stating ".  Ive never had HA like this" Also this morning,  05/28/2012 ,  has some left arm pain with numbness,   but  pt states has had symptoms for ~ 2 weeks. RN  adv to hang up and dial 911 for severe HA per Headache protocol and pt declined stating she will have her daughter take her now to  [facility]

## 2012-05-28 NOTE — ED Provider Notes (Signed)
Pt arrived in CDU comfortable under chest pain protocol. Pending stress echo because she is not a candidate for coronary CT based on BMI>35. Pt states that CP is 7/10 currently at 5:35pm. Denies SOB. Will repeat EKG and give morphine 4mg  IV  Repeat EKG shows to ST/T wave changes Pt resting comfortably    2300 - Pt resting comfortably. Denies Pain, N/V, SOB   Wynetta Emery, PA-C 05/28/12 1737  Wynetta Emery, PA-C 05/28/12 2311

## 2012-05-28 NOTE — ED Notes (Signed)
Patient reports she has had headache for 3 days.  She states her gait is wobbly since.  She also reports she is having left sided chest pain and tingling in her left arm.

## 2012-05-28 NOTE — ED Notes (Signed)
Carb Mod  Diet ordered spoke with Eber Jones

## 2012-05-28 NOTE — ED Notes (Signed)
Pt. C/o of left arm numbness and tingling for the past 2 weeks. Stated that this morning she felt an aching pain below her left breast. States the the arm tingling and left chest pain come and go. Also reports severe headache starting 3 days ago. Denies hx of migraines.

## 2012-05-28 NOTE — ED Provider Notes (Signed)
History     CSN: [id number]  Arrival date & time 05/28/12  1115   First MD Initiated Contact with Patient 05/28/12 1352      Chief Complaint  Patient presents with  . Chest Pain  . Dizziness    (Consider location/radiation/quality/duration/timing/severity/associated sxs/prior treatment) HPI Comments: Patient presenting with a chief complaint of chest pain and SOB.  She reports that she started having chest pain this morning.  Pain has been intermittent throughout the day. Pain typically last a 5 minutes at a time and then resolves without intervention.  Pain not associated with exertion.  She is not having any pain at this time.  Pain associated with numbness and tingling of the right arm.  No nausea, vomiting, or diaphoresis associated with the chest pain.  She reports that she has also been dizzy and had some shortness of breath for the past 2 days.  She has a history of asthma and has been taking her inhaler twice a day and does not feel that it is helping. She has been having some wheezing.  She has a history of hyperlipidemia.  No prior history of HTN or DM.  No prior cardiac history.  She does have a family history of cardiac disease.  Mother died of a MI at age 65 years old.    The history is provided by the patient.    Past Medical History  Diagnosis Date  . Allergic rhinitis   . Asthma   . GERD (gastroesophageal reflux disease)   . Hyperlipidemia   . Vocal cord dysfunction   . Esophageal stricture   . Diabetes mellitus, type 2   . Colonic polyp   . Osteoporosis   . DJD (degenerative joint disease) of knee     bilateral  . Hiatal hernia   . Hypertension   . DDD (degenerative disc disease), lumbar 07/20/2011  . DJD (degenerative joint disease), lumbar 07/20/2011    Past Surgical History  Procedure Date  . Carpal tunnel release     bilateral  . Total abdominal hysterectomy     Family History  Problem Relation Age of Onset  . Cancer Sister   . Heart attack Mother    . Asthma Brother   . Asthma      uncle  . Colon cancer Father     History  Substance Use Topics  . Smoking status: Former Smoker    Quit date: 08/08/1981  . Smokeless tobacco: Not on file  . Alcohol Use: No    OB History    Grav Para Term Preterm Abortions TAB SAB Ect Mult Living                  Review of Systems  Constitutional: Negative for fever and chills.  HENT: Negative for neck pain and neck stiffness.   Eyes: Negative for visual disturbance.  Respiratory: Positive for shortness of breath and wheezing. Negative for cough.   Cardiovascular: Positive for chest pain.  Gastrointestinal: Negative for nausea and vomiting.  Musculoskeletal: Negative for gait problem.  Skin: Negative for rash.  Neurological: Positive for dizziness and headaches. Negative for syncope, facial asymmetry, weakness and light-headedness.    Allergies  Penicillins  Home Medications   Current Outpatient Rx  Name Route Sig Dispense Refill  . ALBUTEROL SULFATE HFA 108 (90 BASE) MCG/ACT IN AERS Inhalation Inhale 2 puffs into the lungs every 4 (four) hours as needed. For shortness of breath/wheezing.    . ALBUTEROL SULFATE (2.5 MG/3ML) 0.083% IN  NEBU Nebulization Take 2.5 mg by nebulization every 4 (four) hours as needed. For shortness of breath.    . ASPIRIN EC 81 MG PO TBEC Oral Take 81 mg by mouth daily.    . BUDESONIDE-FORMOTEROL FUMARATE 160-4.5 MCG/ACT IN AERO Inhalation Inhale 2 puffs into the lungs 2 (two) times daily.      . OMEGA-3 FATTY ACIDS 1000 MG PO CAPS Oral Take 1 g by mouth daily.    Marland Kitchen FLUTICASONE PROPIONATE 50 MCG/ACT NA SUSP Nasal 2 sprays by Nasal route 2 (two) times daily.      Marland Kitchen OMEPRAZOLE 20 MG PO CPDR Oral Take 20 mg by mouth 2 (two) times daily.    Marland Kitchen PRAVASTATIN SODIUM 20 MG PO TABS Oral Take 20 mg by mouth daily.      BP 119/76  Pulse 80  Temp 97.8 F (36.6 C) (Oral)  Resp 15  Ht 5\' 2"  (1.575 m)  Wt 201 lb (91.173 kg)  BMI 36.76 kg/m2  SpO2 97%  Physical  Exam  Nursing note and vitals reviewed. Constitutional: She appears well-developed and well-nourished. No distress.  HENT:  Head: Normocephalic and atraumatic.  Mouth/Throat: Oropharynx is clear and moist.  Neck: Normal range of motion. Neck supple.  Cardiovascular: Normal rate, regular rhythm, normal heart sounds and intact distal pulses.   No murmur heard.      No lower extremity edema  Pulmonary/Chest: Effort normal. No respiratory distress. She has wheezes. She has no rales. She exhibits no tenderness.       Diffuse expiratory wheezing  Abdominal: Soft. There is no tenderness.  Neurological: She is alert. She has normal strength. No cranial nerve deficit or sensory deficit. Gait normal.  Skin: Skin is warm and dry. She is not diaphoretic.  Psychiatric: She has a normal mood and affect.    ED Course  Procedures (including critical care time)  Labs Reviewed  CBC - Abnormal; Notable for the following:    RBC 6.20 (*)     MCV 68.1 (*)     MCH 22.1 (*)     RDW 16.3 (*)     Platelets 621 (*)     All other components within normal limits  BASIC METABOLIC PANEL - Abnormal; Notable for the following:    GFR calc non Af Amer 69 (*)     GFR calc Af Amer 80 (*)     All other components within normal limits  POCT I-STAT TROPONIN I   Dg Chest 2 View  05/28/2012  *RADIOLOGY REPORT*  Clinical Data: Chest pain, dizziness, shortness of breath, left arm pain/numbness, history asthma, GERD, diabetes, hypertension  CHEST - 2 VIEW  Comparison: 07/12/2011  Findings: Normal heart size, mediastinal contours, and pulmonary vascularity. Lungs clear. Minimal central peribronchial thickening unchanged. No pleural effusion or pneumothorax. No acute osseous findings.  IMPRESSION: No acute abnormalities.  Original Report Authenticated By: Lollie Marrow, M.D.     No diagnosis found.    Date: 05/28/2012  Rate: 77  Rhythm: sinus arrhythmia  QRS Axis: normal  Intervals: normal  ST/T Wave  abnormalities: normal  Conduction Disutrbances:none  Narrative Interpretation:   Old EKG Reviewed: none available     MDM  Concern for cardiac etiology of Chest Pain. Patient meets criteria for CPP.   No prior cardiac history.  No acute abnormalities found on EKG and first round of cardiac enzymes negative. This case was discussed with Dr. Juleen China who has seen the patient and agrees with plan to place the  patient on CPP.   BMI>35.  Therefore, Stress Echo has been ordered.        Pascal Lux [place], PA-C 05/28/12 1657  Fraser Busche Anne Shutter, PA-C 05/28/12 1817

## 2012-05-29 ENCOUNTER — Inpatient Hospital Stay (HOSPITAL_COMMUNITY): Admit: 2012-05-29 | Payer: Medicare Other

## 2012-05-29 DIAGNOSIS — R072 Precordial pain: Secondary | ICD-10-CM

## 2012-05-29 MED ORDER — ACETAMINOPHEN 500 MG PO TABS
1000.0000 mg | ORAL_TABLET | Freq: Once | ORAL | Status: DC
  Filled 2012-05-29: qty 2

## 2012-05-29 MED ORDER — ALBUTEROL SULFATE HFA 108 (90 BASE) MCG/ACT IN AERS
2.0000 | INHALATION_SPRAY | Freq: Four times a day (QID) | RESPIRATORY_TRACT | Status: DC
  Administered 2012-05-29: 2 via RESPIRATORY_TRACT
  Filled 2012-05-29: qty 6.7

## 2012-05-29 MED ORDER — PREDNISONE (PAK) 10 MG PO TABS: 10.0000 mg | ORAL_TABLET | Freq: Every day | ORAL | Status: AC

## 2012-05-29 NOTE — Progress Notes (Signed)
Observation review is complete. 

## 2012-05-29 NOTE — ED Notes (Signed)
Pt given wash basin, soap,toothbrush/paste, prepared for stress test. Pt stated that she does not have any sneakers so pt was given hospital socks for exam.

## 2012-05-29 NOTE — ED Provider Notes (Signed)
7:51 AM Patient is in CDU under observation, chest pain protocol.  This is a shared visit with Dr Juleen China.  Pt presented with complaints of CP, SOB, arm tingling, headache, dizziness.  Hx hyperlipidemia, family Hx CAD.  Pt reports headache 5/10 intensity, denies chest pain.  Pt denies SOB currently, hx asthma.  On exam, pt is A&Ox4, NAD, RRR, no m/r/g, soft diffuse expiratory wheezes, abd soft, NT, extremities without edema, distal pulses intact and equal bilaterally.  Plan is for exercise stress test this morning.  Pt will need prednisone and albuterol hfa for home if negative stress test.  PCP Oliver Barre.   11:14 AM Pt reports continued headache. Neurologically intact.  Will treat headache prior to d/c. Received phone call from cardiologist Dr Jens Som, stress echo is negative.   Plan is for d/c home, PCP follow up.  Discussed all results with patient.  Patient verbalizes understanding and agrees with plan.     Results for orders placed during the hospital encounter of 05/28/12  CBC      Component Value Range   WBC 8.5  4.0 - 10.5 K/uL   RBC 6.20 (*) 3.87 - 5.11 MIL/uL   Hemoglobin 13.7  12.0 - 15.0 g/dL   HCT 40.9  81.1 - 91.4 %   MCV 68.1 (*) 78.0 - 100.0 fL   MCH 22.1 (*) 26.0 - 34.0 pg   MCHC 32.5  30.0 - 36.0 g/dL   RDW 78.2 (*) 95.6 - 21.3 %   Platelets 621 (*) 150 - 400 K/uL  BASIC METABOLIC PANEL      Component Value Range   Sodium 142  135 - 145 mEq/L   Potassium 4.0  3.5 - 5.1 mEq/L   Chloride 106  96 - 112 mEq/L   CO2 27  19 - 32 mEq/L   Glucose, Bld 79  70 - 99 mg/dL   BUN 11  6 - 23 mg/dL   Creatinine, Ser 0.86  0.50 - 1.10 mg/dL   Calcium 9.6  8.4 - 57.8 mg/dL   GFR calc non Af Amer 69 (*) >90 mL/min   GFR calc Af Amer 80 (*) >90 mL/min  POCT I-STAT TROPONIN I      Component Value Range   Troponin i, poc 0.00  0.00 - 0.08 ng/mL   Comment 3           TROPONIN I      Component Value Range   Troponin I <0.30  <0.30 ng/mL  TROPONIN I      Component Value Range   Troponin I <0.30  <0.30 ng/mL   Dg Chest 2 View  05/28/2012  *RADIOLOGY REPORT*  Clinical Data: Chest pain, dizziness, shortness of breath, left arm pain/numbness, history asthma, GERD, diabetes, hypertension  CHEST - 2 VIEW  Comparison: 07/12/2011  Findings: Normal heart size, mediastinal contours, and pulmonary vascularity. Lungs clear. Minimal central peribronchial thickening unchanged. No pleural effusion or pneumothorax. No acute osseous findings.  IMPRESSION: No acute abnormalities.  Original Report Authenticated By: Lollie Marrow, M.D.      Rise Patience, PA 05/29/12 1118  Dillard Cannon [place], [place] 05/29/12 1120

## 2012-05-29 NOTE — Discharge Instructions (Signed)
Read the information below.  Please call Dr Jonny Ruiz today to schedule a close follow up appointment.  If you develop fevers, uncontrolled pain, vomiting, inability to tolerate fluids by mouth, weakness or numbness in your arms or legs, difficulty walking or speaking, return to the ER immediately for a recheck.  You may return to the ER at any time for worsening condition or any new symptoms that concern you.

## 2012-05-31 ENCOUNTER — Ambulatory Visit (INDEPENDENT_AMBULATORY_CARE_PROVIDER_SITE_OTHER): Payer: Medicare Other | Admitting: Internal Medicine

## 2012-05-31 ENCOUNTER — Encounter: Payer: Self-pay | Admitting: Internal Medicine

## 2012-05-31 VITALS — BP 120/68 | HR 78 | Temp 97.8°F | Ht 62.0 in | Wt 200.4 lb

## 2012-05-31 DIAGNOSIS — G43909 Migraine, unspecified, not intractable, without status migrainosus: Secondary | ICD-10-CM

## 2012-05-31 DIAGNOSIS — R209 Unspecified disturbances of skin sensation: Secondary | ICD-10-CM

## 2012-05-31 DIAGNOSIS — J45901 Unspecified asthma with (acute) exacerbation: Secondary | ICD-10-CM

## 2012-05-31 DIAGNOSIS — R200 Anesthesia of skin: Secondary | ICD-10-CM

## 2012-05-31 MED ORDER — SUMATRIPTAN SUCCINATE 100 MG PO TABS: 100.0000 mg | ORAL_TABLET | ORAL | Status: DC | PRN

## 2012-05-31 NOTE — Patient Instructions (Addendum)
Take all new medications as prescribed - the imitrex for future headaches if happens again Continue all other medications as before , including finishing the prednisone Please consider return to see about possible MRI for the neck if the left arm numbness is worse, or pain worse Please return in 6 months, or sooner if needed

## 2012-06-03 ENCOUNTER — Encounter: Payer: Self-pay | Admitting: Internal Medicine

## 2012-06-03 DIAGNOSIS — R200 Anesthesia of skin: Secondary | ICD-10-CM | POA: Insufficient documentation

## 2012-06-03 NOTE — Assessment & Plan Note (Signed)
Improving, cont predpack taper as she is doing

## 2012-06-03 NOTE — Assessment & Plan Note (Signed)
?   c-spine related neuropathic - exam benign, consider MRI c-spine

## 2012-06-03 NOTE — Assessment & Plan Note (Signed)
Clinical dx - for imitrex prn,  to f/u any worsening symptoms or concerns

## 2012-06-03 NOTE — Progress Notes (Signed)
Subjective:    Patient ID: [name], female    DOB: Nov 25, 1946, 66 y.o.   MRN: [id number]  HPI  Here after being seen in ER with episode of unusual for her throbbing HA assoc with dizziness, blurred vision, nausea lasting several hours, nothing made better or worse, lasted several hours without recurrence.  Also with dyspnea/cp - stress neg, MI r/o'd.  No recurrence of chest discomfort, but dyspnea persists though improved with prednisone taper.  Pt denies  increased sob or doe, wheezing, orthopnea, PND, increased LE swelling, palpitations, dizziness or syncope.  Has hx of small CVA right per MRI aug 2012.  Pt denies new neurological symptoms such as new headache, or facial or extremity weakness or numbness  Has intermittent left arm numbness mild for several months, seems to involve the shoulder and arm, may start from the neck but not assoc with radicular pain or weakness.   Pt denies fever, wt loss, night sweats, loss of appetite, or other constitutional symptoms.   Pt denies polydipsia, polyuria. Past Medical History  Diagnosis Date  . Allergic rhinitis   . Asthma   . GERD (gastroesophageal reflux disease)   . Hyperlipidemia   . Vocal cord dysfunction   . Esophageal stricture   . Diabetes mellitus, type 2   . Colonic polyp   . Osteoporosis   . DJD (degenerative joint disease) of knee     bilateral  . Hiatal hernia   . Hypertension   . DDD (degenerative disc disease), lumbar 07/20/2011  . DJD (degenerative joint disease), lumbar 07/20/2011   Past Surgical History  Procedure Date  . Carpal tunnel release     bilateral  . Total abdominal hysterectomy     reports that she quit smoking about 30 years ago. She does not have any smokeless tobacco history on file. She reports that she does not drink alcohol or use illicit drugs. family history includes Asthma in her brother and unspecified family member; Cancer in her sister; Colon cancer in her father; and Heart attack in her  mother. Allergies  Allergen Reactions  . Penicillins     REACTION: unspecified   Current Outpatient Prescriptions on File Prior to Visit  Medication Sig Dispense Refill  . albuterol (PROVENTIL HFA) 108 (90 BASE) MCG/ACT inhaler Inhale 2 puffs into the lungs every 4 (four) hours as needed. For shortness of breath/wheezing.      Marland Kitchen albuterol (PROVENTIL) (2.5 MG/3ML) 0.083% nebulizer solution Take 2.5 mg by nebulization every 4 (four) hours as needed. For shortness of breath.      Marland Kitchen aspirin EC 81 MG tablet Take 81 mg by mouth daily.      . budesonide-formoterol (SYMBICORT) 160-4.5 MCG/ACT inhaler Inhale 2 puffs into the lungs 2 (two) times daily.        . fish oil-omega-3 fatty acids 1000 MG capsule Take 1 g by mouth daily.      . fluticasone (FLONASE) 50 MCG/ACT nasal spray 2 sprays by Nasal route 2 (two) times daily.        Marland Kitchen omeprazole (PRILOSEC) 20 MG capsule Take 20 mg by mouth 2 (two) times daily.      . pravastatin (PRAVACHOL) 20 MG tablet Take 20 mg by mouth daily.      . predniSONE (STERAPRED UNI-PAK) 10 MG tablet Take 1 tablet (10 mg total) by mouth daily. Day 1: take 6 tablets.  Day 2: 5 tablets.  Day 3: 4 tablets.  Day 4: 3 tablets.  Day 5: 2  tablets.  Day 6:1 tablet.  21 tablet  0  . SUMAtriptan (IMITREX) 100 MG tablet Take 1 tablet (100 mg total) by mouth every 2 (two) hours as needed for migraine.  10 tablet  5   Review of Systems Review of Systems  Constitutional: Negative for diaphoresis and unexpected weight change.  HENT: Negative tinnitus.   Eyes: Negative for photophobia and visual disturbance.  Respiratory: Negative for choking and stridor.   Gastrointestinal: Negative for vomiting and blood in stool.  Genitourinary: Negative for hematuria and decreased urine volume.  Musculoskeletal: Negative for gait problem.  Skin: Negative for color change and wound.  Neurological: Negative for tremors and numbness.  Psychiatric/Behavioral: Negative for decreased concentration. The  patient is not hyperactive.      Objective:   Physical Exam BP 120/68  Pulse 78  Temp 97.8 F (36.6 C) (Oral)  Ht 5\' 2"  (1.575 m)  Wt 200 lb 6 oz (90.89 kg)  BMI 36.65 kg/m2  SpO2 97% Physical Exam  VS noted, not ill appearing Constitutional: Pt appears well-developed and well-nourished.  HENT: Head: Normocephalic.  Right Ear: External ear normal.  Left Ear: External ear normal.  Eyes: Conjunctivae and EOM are normal. Pupils are equal, round, and reactive to light.  Neck: Normal range of motion. Neck supple.  Cardiovascular: Normal rate and regular rhythm.   Pulmonary/Chest: Effort normal and breath sounds mild decreased/trace wheezing.  Abd:  Soft, NT, non-distended, + BS Neurological: Pt is alert. Not confused, motor/sens/dtr/gait intact Skin: Skin is warm. No erythema. No rash Psychiatric: Pt behavior is normal. Thought content normal.     Assessment & Plan:

## 2012-06-04 NOTE — ED Provider Notes (Signed)
Medical screening examination/treatment/procedure(s) were performed by non-physician practitioner and as supervising physician I was immediately available for consultation/collaboration.  Raeford Razor, MD 06/04/12 [phone]

## 2012-06-04 NOTE — ED Provider Notes (Signed)
Medical screening examination/treatment/procedure(s) were conducted as a shared visit with non-physician practitioner(s) and myself.  I personally evaluated the patient during the encounter.  Pt with CP and SOB. Concern for cardiac etiology. Risk factors for CAD including HLD and DM. EKG with nonspecific changes. No respiratory distress on exam. CP not reproducible. RRR w/o murmur. Lower extremities symmetric as compared to each other. No calf tenderness. Negative Homan's. No palpable cords. Plan admit for r/o or CPP for fruther eval.  Raeford Razor, MD 06/04/12 [phone]

## 2012-06-19 ENCOUNTER — Ambulatory Visit (INDEPENDENT_AMBULATORY_CARE_PROVIDER_SITE_OTHER): Payer: Medicare Other | Admitting: Emergency Medicine

## 2012-06-19 ENCOUNTER — Encounter: Payer: Self-pay | Admitting: Emergency Medicine

## 2012-06-19 VITALS — BP 132/72 | HR 84 | Temp 98.4°F | Ht 62.0 in | Wt 203.8 lb

## 2012-06-19 DIAGNOSIS — J45909 Unspecified asthma, uncomplicated: Secondary | ICD-10-CM

## 2012-06-19 DIAGNOSIS — J383 Other diseases of vocal cords: Secondary | ICD-10-CM

## 2012-06-19 DIAGNOSIS — J309 Allergic rhinitis, unspecified: Secondary | ICD-10-CM

## 2012-06-19 MED ORDER — FEXOFENADINE HCL 180 MG PO TABS: 180.0000 mg | ORAL_TABLET | Freq: Every day | ORAL | Status: DC

## 2012-06-19 NOTE — Assessment & Plan Note (Signed)
Almost all of her sx are VCD, including on exam today. Will continue Symbicort and albuterol prn

## 2012-06-19 NOTE — Assessment & Plan Note (Signed)
-   control GERD and PND - discussed the impact of anxiety on VCD

## 2012-06-19 NOTE — Progress Notes (Signed)
Subjective:    Patient ID: [name], female    DOB: 19-May-1946, 66 y.o.   MRN: [id number] HPI 66 -year-old woman with a history of asthma and vocal cord dysfunction both of which have been significantly impacted by severe esophageal reflux. Has a hx esophageal stricture dilations by Dr Marina Goodell. On omeprazole 20mg  two times a day. Uses Symbicort bid.   ROV 05/20/10 -- regular f/u for VCD and asthma. No problems since last visit. Has had good control of both her GERD and UA irritation right now. Takes Symbicort reliably. Has been walking, has lost 9 lbs since last visit. Mentions today that she has a nevus on her chest that has become painful when her clothes rub against it.   ROV 09/07/10 -- f/u for allergies, GERD, VCD, asthma. Gained back 4 lbs. For the last 3 weeks more trouble with SOB and UA wheezing. Using SABA 1x a day. Taking Symbicort two times a day. She was recently started on clonidine in August, she questions whether this is a factor in her SOB. Occas globus sensation. Freq cough. No real heartburn symptoms. Occas gets cough and irritation from using symbicort.   ROV 10/08/10 -- allergies, GERD, VCD > asthma. Returns for f/u. last time we stopped clonidine to see if it would help her breathing - no change. Has continued cough, UA wheezing, throat clearing, tickle in her throat. Swallowing is ok. She has globus sensation, but doesn't clear phlegm. Reliable about her omeprazole two times a day.   11/18/10 -- VCD, GERD, allergies, probable coexisting asthma. Returns for f/u. Last time restarted loratadine, fluticasone. She was already on omeprazole. Her breathing is better. Today she mentions some dizziness, no syncope, can happen with sitting or standing. The room spins. No CP, no dyspnea, no palpitations. Remains off clonidine, stopped in October to see if it would help breathing. BP is OK (she was on it for sweats).   ROV 05/11/11 -- Hx of VCD and asthma that have been exacerbated by GERD  and allergic rhinitis. She returns reporting more DOE over about the last 5 -6 months. Her wt has been stable at 206. She is not having any throat symptoms, has a strong voice. Rare SABA use, but she did need it more frequently in March. She is taking her fexofenadine, fluticasone bid, omeprazole. Symbicort bid.   ROV 06/06/2011  Presents for an acute office visit. Complains of increased SOB, wheezing, prod cough with yellow mucus x 3days. Wheezing is getting worse today. She is under a lot of stress, tired a lot . Husband had stroke 3 years ago, requires total care- at home by her.  OTC not helping. Has some drainage.   ROV 11/10/11 -- Hx of VCD and asthma that have been exacerbated by GERD and allergic rhinitis. Returns for f/u. Since last visit she had a normal nuclear cardiac stress test. She tells me that she had an episode last week when she needed her albuterol more - ? After exposure to perfume.  Still uses Symbicort bid, albuterol prn. Her voice has been strong, taking her allergy regimen and PPI. Needs Flu shot.   Acute OV 04/16/12 Complains increased SOB, wheezing, tightness in chest, dry cough, chest congestion x3 days.  Increased wheezing and dyspnea . Using her albuterol neb more over last 3 days.  No fever or discolored. No recent travel or abx use.  Has been doing well until last few days. No ER or Hospital visit. Rare use of SABA until this  illness.  Under some stress with sick husband  ROV 06/19/12 -- Hx of VCD and asthma that have been exacerbated by GERD and allergic rhinitis. Formerly on chronic steroids but we successfully weaned to off. She tells me that she was admitted to [facility] for HA and CP, L arm numbness - ECG was reassuring. During that admit she had more trouble w her breathing, treated w short burst pred. Still w some allergic sx, taking fluticasone. Also on omeprazole bid. In general her control is OK - using her SABA about 2x a day.    Objective:   Physical Exam Filed  Vitals:   06/19/12 1329  BP: 132/72  Pulse: 84  Temp: 98.4 F (36.9 C)   Gen: Pleasant, overwt, in no distress,  normal affect, NAD  ENT: No lesions,  mouth clear,  oropharynx clear, no postnasal drip  Neck: supple no JVD, loud insp and exp stridor (close to her baseline)  Lungs clear with referred UA noise,   Cardiovascular: RRR, heart sounds normal, no murmur or gallops, no peripheral edema  Musculoskeletal: No deformities, no cyanosis or clubbing  Neuro: alert, non focal  Skin: Warm, no lesions or rashes      Assessment & Plan:   ALLERGIC RHINITIS Add fexofenadine back to the fluticasone spray  ASTHMA, MILD Almost all of her sx are VCD, including on exam today. Will continue Symbicort and albuterol prn  VOCAL CORD DISORDER - control GERD and PND - discussed the impact of anxiety on VCD

## 2012-06-19 NOTE — Patient Instructions (Addendum)
Please continue your Symbicort 2 puffs twice a day Use your albuterol as needed Continue your omeprazole and fluticasone nasal spray Restart fexofenadine 180mg  daily.  Follow with Dr Delton Coombes in 4 months or sooner if you have any problems.

## 2012-06-19 NOTE — Assessment & Plan Note (Signed)
Add fexofenadine back to the fluticasone spray

## 2012-06-27 ENCOUNTER — Telehealth: Payer: Self-pay

## 2012-06-27 MED ORDER — SUMATRIPTAN SUCCINATE 100 MG PO TABS: 100.0000 mg | ORAL_TABLET | ORAL | Status: DC | PRN

## 2012-06-27 NOTE — Telephone Encounter (Signed)
No PA needed; I [facility]

## 2012-06-27 NOTE — Telephone Encounter (Signed)
Received PA for Sumatriptan 100 mg.  [facility] they do not require PA for #9 pills for 30 days or #27 pills #90 days. If the patient requires more than those amounts PA is required please advise.

## 2012-07-17 ENCOUNTER — Encounter: Payer: Self-pay | Admitting: Internal Medicine

## 2012-07-19 ENCOUNTER — Encounter: Payer: Self-pay | Admitting: Internal Medicine

## 2012-08-14 ENCOUNTER — Telehealth: Payer: Self-pay | Admitting: Emergency Medicine

## 2012-08-14 NOTE — Telephone Encounter (Signed)
Called # provided above - went directly to VM - [email or web address] 

## 2012-08-15 MED ORDER — BUDESONIDE-FORMOTEROL FUMARATE 160-4.5 MCG/ACT IN AERO: 2.0000 | INHALATION_SPRAY | Freq: Two times a day (BID) | RESPIRATORY_TRACT | Status: DC

## 2012-08-15 NOTE — Telephone Encounter (Signed)
Spoke with pt and she states needs sample of symbicort to last until her shipment arrives.  1 box symbicort up front  Pt states nothing further needed.

## 2012-08-20 ENCOUNTER — Ambulatory Visit (AMBULATORY_SURGERY_CENTER): Payer: Medicare Other | Admitting: *Deleted

## 2012-08-20 VITALS — Ht 62.0 in | Wt 202.3 lb

## 2012-08-20 DIAGNOSIS — Z1211 Encounter for screening for malignant neoplasm of colon: Secondary | ICD-10-CM

## 2012-08-20 MED ORDER — MOVIPREP 100 G PO SOLR: ORAL | Status: DC

## 2012-09-01 ENCOUNTER — Other Ambulatory Visit: Payer: Self-pay

## 2012-09-01 ENCOUNTER — Emergency Department (HOSPITAL_COMMUNITY): Payer: Medicare Other

## 2012-09-01 ENCOUNTER — Inpatient Hospital Stay (HOSPITAL_COMMUNITY)
Admission: EM | Admit: 2012-09-01 | Discharge: 2012-09-02 | DRG: 069 | Disposition: A | Discharge status: Home / Self Care | Payer: Medicare Other | Attending: Internal Medicine | Admitting: Internal Medicine

## 2012-09-01 ENCOUNTER — Encounter (HOSPITAL_COMMUNITY): Payer: Self-pay

## 2012-09-01 DIAGNOSIS — M5136 Other intervertebral disc degeneration, lumbar region: Secondary | ICD-10-CM

## 2012-09-01 DIAGNOSIS — B029 Zoster without complications: Secondary | ICD-10-CM

## 2012-09-01 DIAGNOSIS — M79609 Pain in unspecified limb: Secondary | ICD-10-CM

## 2012-09-01 DIAGNOSIS — Z880 Allergy status to penicillin: Secondary | ICD-10-CM

## 2012-09-01 DIAGNOSIS — F410 Panic disorder [episodic paroxysmal anxiety] without agoraphobia: Secondary | ICD-10-CM | POA: Diagnosis present

## 2012-09-01 DIAGNOSIS — M1710 Unilateral primary osteoarthritis, unspecified knee: Secondary | ICD-10-CM

## 2012-09-01 DIAGNOSIS — R0609 Other forms of dyspnea: Secondary | ICD-10-CM

## 2012-09-01 DIAGNOSIS — G43909 Migraine, unspecified, not intractable, without status migrainosus: Secondary | ICD-10-CM

## 2012-09-01 DIAGNOSIS — E785 Hyperlipidemia, unspecified: Secondary | ICD-10-CM | POA: Diagnosis present

## 2012-09-01 DIAGNOSIS — Z8249 Family history of ischemic heart disease and other diseases of the circulatory system: Secondary | ICD-10-CM

## 2012-09-01 DIAGNOSIS — Z8673 Personal history of transient ischemic attack (TIA), and cerebral infarction without residual deficits: Secondary | ICD-10-CM

## 2012-09-01 DIAGNOSIS — M5416 Radiculopathy, lumbar region: Secondary | ICD-10-CM

## 2012-09-01 DIAGNOSIS — R0989 Other specified symptoms and signs involving the circulatory and respiratory systems: Secondary | ICD-10-CM

## 2012-09-01 DIAGNOSIS — G458 Other transient cerebral ischemic attacks and related syndromes: Principal | ICD-10-CM | POA: Diagnosis present

## 2012-09-01 DIAGNOSIS — G8194 Hemiplegia, unspecified affecting left nondominant side: Secondary | ICD-10-CM | POA: Diagnosis present

## 2012-09-01 DIAGNOSIS — I639 Cerebral infarction, unspecified: Secondary | ICD-10-CM | POA: Diagnosis present

## 2012-09-01 DIAGNOSIS — N959 Unspecified menopausal and perimenopausal disorder: Secondary | ICD-10-CM

## 2012-09-01 DIAGNOSIS — Z79899 Other long term (current) drug therapy: Secondary | ICD-10-CM

## 2012-09-01 DIAGNOSIS — R209 Unspecified disturbances of skin sensation: Secondary | ICD-10-CM | POA: Diagnosis present

## 2012-09-01 DIAGNOSIS — K449 Diaphragmatic hernia without obstruction or gangrene: Secondary | ICD-10-CM | POA: Diagnosis present

## 2012-09-01 DIAGNOSIS — R200 Anesthesia of skin: Secondary | ICD-10-CM | POA: Diagnosis present

## 2012-09-01 DIAGNOSIS — Z0000 Encounter for general adult medical examination without abnormal findings: Secondary | ICD-10-CM

## 2012-09-01 DIAGNOSIS — G459 Transient cerebral ischemic attack, unspecified: Secondary | ICD-10-CM | POA: Diagnosis present

## 2012-09-01 DIAGNOSIS — R0789 Other chest pain: Secondary | ICD-10-CM | POA: Diagnosis present

## 2012-09-01 DIAGNOSIS — J309 Allergic rhinitis, unspecified: Secondary | ICD-10-CM

## 2012-09-01 DIAGNOSIS — K219 Gastro-esophageal reflux disease without esophagitis: Secondary | ICD-10-CM | POA: Diagnosis present

## 2012-09-01 DIAGNOSIS — K222 Esophageal obstruction: Secondary | ICD-10-CM

## 2012-09-01 DIAGNOSIS — D239 Other benign neoplasm of skin, unspecified: Secondary | ICD-10-CM

## 2012-09-01 DIAGNOSIS — R079 Chest pain, unspecified: Secondary | ICD-10-CM | POA: Diagnosis present

## 2012-09-01 DIAGNOSIS — G8190 Hemiplegia, unspecified affecting unspecified side: Secondary | ICD-10-CM

## 2012-09-01 DIAGNOSIS — R1319 Other dysphagia: Secondary | ICD-10-CM

## 2012-09-01 DIAGNOSIS — Z7982 Long term (current) use of aspirin: Secondary | ICD-10-CM

## 2012-09-01 DIAGNOSIS — Z86010 Personal history of colonic polyps: Secondary | ICD-10-CM

## 2012-09-01 DIAGNOSIS — E119 Type 2 diabetes mellitus without complications: Secondary | ICD-10-CM | POA: Diagnosis present

## 2012-09-01 DIAGNOSIS — M47816 Spondylosis without myelopathy or radiculopathy, lumbar region: Secondary | ICD-10-CM

## 2012-09-01 DIAGNOSIS — R5381 Other malaise: Secondary | ICD-10-CM

## 2012-09-01 DIAGNOSIS — Z23 Encounter for immunization: Secondary | ICD-10-CM

## 2012-09-01 DIAGNOSIS — M810 Age-related osteoporosis without current pathological fracture: Secondary | ICD-10-CM

## 2012-09-01 DIAGNOSIS — J45909 Unspecified asthma, uncomplicated: Secondary | ICD-10-CM | POA: Diagnosis present

## 2012-09-01 DIAGNOSIS — M6530 Trigger finger, unspecified finger: Secondary | ICD-10-CM

## 2012-09-01 DIAGNOSIS — Z87891 Personal history of nicotine dependence: Secondary | ICD-10-CM

## 2012-09-01 DIAGNOSIS — I8000 Phlebitis and thrombophlebitis of superficial vessels of unspecified lower extremity: Secondary | ICD-10-CM

## 2012-09-01 DIAGNOSIS — R55 Syncope and collapse: Secondary | ICD-10-CM

## 2012-09-01 DIAGNOSIS — N760 Acute vaginitis: Secondary | ICD-10-CM

## 2012-09-01 DIAGNOSIS — E782 Mixed hyperlipidemia: Secondary | ICD-10-CM | POA: Diagnosis present

## 2012-09-01 DIAGNOSIS — J383 Other diseases of vocal cords: Secondary | ICD-10-CM | POA: Diagnosis present

## 2012-09-01 DIAGNOSIS — R1310 Dysphagia, unspecified: Secondary | ICD-10-CM

## 2012-09-01 LAB — COMPREHENSIVE METABOLIC PANEL
ALT: 13 U/L (ref 0–35)
CO2: 26 mEq/L (ref 19–32)
Calcium: 9.4 mg/dL (ref 8.4–10.5)
Chloride: 105 mEq/L (ref 96–112)
Creatinine, Ser: 0.85 mg/dL (ref 0.50–1.10)
GFR calc Af Amer: 80 mL/min — ABNORMAL LOW (ref >90)
GFR calc non Af Amer: 69 mL/min — ABNORMAL LOW (ref >90)
Glucose, Bld: 100 mg/dL — ABNORMAL HIGH (ref 70–99)
Sodium: 139 mEq/L (ref 135–145)
Total Bilirubin: 0.2 mg/dL — ABNORMAL LOW (ref 0.3–1.2)

## 2012-09-01 LAB — URINALYSIS, ROUTINE W REFLEX MICROSCOPIC
Bilirubin Urine: NEGATIVE
Ketones, ur: NEGATIVE mg/dL
Nitrite: NEGATIVE
Protein, ur: NEGATIVE mg/dL
Specific Gravity, Urine: 1.021 (ref 1.005–1.030)
Urobilinogen, UA: 1 mg/dL (ref 0.0–1.0)

## 2012-09-01 LAB — URINE MICROSCOPIC-ADD ON

## 2012-09-01 LAB — TROPONIN I: Troponin I: <0.3 ng/mL (ref <0.30)

## 2012-09-01 LAB — PRO B NATRIURETIC PEPTIDE: Pro B Natriuretic peptide (BNP): 61.1 pg/mL (ref 0–125)

## 2012-09-01 MED ORDER — NITROGLYCERIN 0.4 MG SL SUBL
0.4000 mg | SUBLINGUAL_TABLET | SUBLINGUAL | Status: AC | PRN
  Administered 2012-09-01 (×3): 0.4 mg via SUBLINGUAL
  Filled 2012-09-01: qty 25

## 2012-09-01 NOTE — ED Notes (Signed)
Patient currently sitting up in bed; no respiratory or acute distress noted.  Patient updated on plan of care; informed patient that we are currently waiting on a bed request to be put in.  Patient has no other questions or concerns at this time; will continue to monitor.

## 2012-09-01 NOTE — ED Notes (Signed)
Received bedside report from [place], [place].  Patient currently sitting up in bed; no respiratory or acute distress noted.  Family members (6) present at bedside.  Patient updated on plan of care; informed patient that we need a urine sample.  Patient states that she is unable to go at this time.  When asked if patient is having any pain, patient denies.  Family member mentioned that MD was to order pain medication; patient asked again about her pain level.  Patient currently rates chest pain 6/10 on the numerical pain scale.  EDP notified.  Patient has no other questions or concerns at this time; will continue to monitor.

## 2012-09-01 NOTE — ED Notes (Signed)
Patient back from x-ray; currently sitting up in bed.  No respiratory or acute distress noted.  Patient updated on plan of care; informed patient we are going to run another troponin test to look for any differences.  Patient has no other questions or concerns; will continue to monitor.

## 2012-09-01 NOTE — H&P (Signed)
PCP:   Oliver Barre, MD    Chief Complaint:   Chest pain  HPI: [name] is a 66 y.o. female   has a past medical history of Allergic rhinitis; Asthma; GERD (gastroesophageal reflux disease); Hyperlipidemia; Vocal cord dysfunction; Esophageal stricture; Colonic polyp; Osteoporosis; DJD (degenerative joint disease) of knee; Hiatal hernia; DDD (degenerative disc disease), lumbar (07/20/2011); DJD (degenerative joint disease), lumbar (07/20/2011); and Diabetes mellitus, type 2.   Presented with  She started to have sharp chest pain in the side of her chest and under her left breast. The pain persisted for 30 min. Was non pleuritic, non posititional. Denies any Shortness of breath or nausea. She has some wheezing lately which she attributes this to her asthma. The pain has improved after nitroglycerine but has not resolved.   Also associated with this she have had some sensation of numbness in her left hand and weakness that now has somewhat improved. Denies any double vision or slurred speech. NO ataxia. She states she does stumbles sometimes. She though that her weakness is new but does state she have not paid much attention.   Of note she was admitted in May for chest pain and have had a negative stress test.   Review of Systems:    Pertinent positives include: chest pain,  localizing neurological complaints,  Tingling , weakness.  Constitutional:  No weight loss, night sweats, Fevers, chills, fatigue, weight loss  HEENT:  No headaches, Difficulty swallowing,Tooth/dental problems,Sore throat,  No sneezing, itching, ear ache, nasal congestion, post nasal drip,  Cardio-vascular:  No Orthopnea, PND, anasarca, dizziness, palpitations.no Bilateral lower extremity swelling  GI:  No heartburn, indigestion, abdominal pain, nausea, vomiting, diarrhea, change in bowel habits, loss of appetite, melena, blood in stool, hematemesis Resp:  no shortness of breath at rest. No dyspnea on exertion, No  excess mucus, no productive cough, No non-productive cough, No coughing up of blood.No change in color of mucus.No wheezing. Skin:  no rash or lesions. No jaundice GU:  no dysuria, change in color of urine, no urgency or frequency. No straining to urinate.  No flank pain.  Musculoskeletal:  No joint pain or no joint swelling. No decreased range of motion. No back pain.  Psych:  No change in mood or affect. No depression or anxiety. No memory loss.  Neuro: no double vision, no gait abnormality, no slurred speech, no confusion  Otherwise ROS are negative except for above, 10 systems were reviewed  Past Medical History: Past Medical History  Diagnosis Date  . Allergic rhinitis   . Asthma   . GERD (gastroesophageal reflux disease)   . Hyperlipidemia   . Vocal cord dysfunction   . Esophageal stricture   . Colonic polyp   . Osteoporosis   . DJD (degenerative joint disease) of knee     bilateral  . Hiatal hernia   . DDD (degenerative disc disease), lumbar 07/20/2011  . DJD (degenerative joint disease), lumbar 07/20/2011  . Diabetes mellitus, type 2     does not take medication; pt denies   Past Surgical History  Procedure Date  . Carpal tunnel release     bilateral  . Total abdominal hysterectomy   . Foot surgery     bilateral     Medications: Prior to Admission medications   Medication Sig Start Date End Date Taking? Authorizing Provider  albuterol (PROVENTIL HFA) 108 (90 BASE) MCG/ACT inhaler Inhale 2 puffs into the lungs every 4 (four) hours as needed. For shortness of breath/wheezing.  Yes Historical Provider, MD  albuterol (PROVENTIL) (2.5 MG/3ML) 0.083% nebulizer solution Take 2.5 mg by nebulization every 4 (four) hours as needed. For shortness of breath.   Yes Historical Provider, MD  aspirin EC 81 MG tablet Take 81 mg by mouth daily.   Yes Historical Provider, MD  BLACK COHOSH PO Take 1 capsule by mouth daily.    Yes Historical Provider, MD  budesonide-formoterol  (SYMBICORT) 160-4.5 MCG/ACT inhaler Inhale 2 puffs into the lungs 2 (two) times daily. 08/15/12  Yes Leslye Peer, MD  fexofenadine (ALLEGRA) 180 MG tablet Take 180 mg by mouth daily. 06/19/12 06/19/13 Yes Leslye Peer, MD  fish oil-omega-3 fatty acids 1000 MG capsule Take 1 g by mouth daily.   Yes Historical Provider, MD  fluticasone (FLONASE) 50 MCG/ACT nasal spray Place 1 spray into the nose 2 (two) times daily.    Yes Historical Provider, MD  naproxen sodium (ANAPROX) 220 MG tablet Take 440 mg by mouth daily as needed. For headache   Yes Historical Provider, MD  omeprazole (PRILOSEC) 20 MG capsule Take 20 mg by mouth 2 (two) times daily.   Yes Historical Provider, MD  pravastatin (PRAVACHOL) 20 MG tablet Take 20 mg by mouth every morning.    Yes Historical Provider, MD  aspirin 81 MG chewable tablet Chew 324 mg by mouth once.    Historical Provider, MD    Allergies:   Allergies  Allergen Reactions  . Penicillins Itching and Rash    Social History:  Ambulatory independently  Lives at home with husband.    reports that she quit smoking about 31 years ago. Her smoking use included Cigarettes. She has a 35 pack-year smoking history. She has never used smokeless tobacco. She reports that she does not drink alcohol or use illicit drugs.   Family History: family history includes Asthma in her brother and unspecified family member; Cancer in her sister; Colon cancer (age of onset:70) in her father; and Heart attack in her mother.  There is no history of Stomach cancer.    Physical Exam: Patient Vitals for the past 24 hrs:  BP Temp Temp src Pulse Resp SpO2  09/01/12 2200 120/67 mmHg - - 77  16  98 %  09/01/12 2115 120/67 mmHg - - 73  13  100 %  09/01/12 2050 108/59 mmHg - - - - -  09/01/12 2034 116/64 mmHg - - - - -  09/01/12 2018 128/72 mmHg 97.8 F (36.6 C) - 78  18  98 %  09/01/12 1900 124/75 mmHg - - 70  15  99 %  09/01/12 1800 115/82 mmHg - - 74  18  100 %  09/01/12 1730 - - -  65  19  100 %  09/01/12 1709 119/60 mmHg 98.5 F (36.9 C) Oral - 18  99 %    1. General:  in No Acute distress 2. Psychological: Alert and Oriented 3. Head/ENT:   Moist   Mucous Membranes                          Head Non traumatic, neck supple                          Normal Dentition 4. SKIN: normal   Skin turgor,  Skin clean Dry and intact no rash 5. Heart: Regular rate and rhythm no Murmur, Rub or gallop 6. Lungs: some wheezes present, but no  crackles   7. Abdomen: Soft, non-tender, Non distended, obese 8. Lower extremities: no clubbing, cyanosis, or edema 9. Neurologically strength appear weaker on the left 4/5 strength and intact o the right, Right facial droop noted that is new for her.  10. MSK: Normal range of motion  body mass index is unknown because there is no height or weight on file.   Labs on Admission:   [facility] 09/01/12 1912  NA 139  K 4.0  CL 105  CO2 26  GLUCOSE 100*  BUN 10  CREATININE 0.85  CALCIUM 9.4  MG 2.2  PHOS --    Basename 09/01/12 1912  AST 19  ALT 13  ALKPHOS 77  BILITOT 0.2*  PROT 6.7  ALBUMIN 3.3*   No results found for this basename: LIPASE:2,AMYLASE:2 in the last 72 hours No results found for this basename: WBC:2,NEUTROABS:2,HGB:2,HCT:2,MCV:2,PLT:2 in the last 72 hours  Basename 09/01/12 2018  CKTOTAL --  CKMB --  CKMBINDEX --  TROPONINI <0.30   No results found for this basename: TSH,T4TOTAL,FREET3,T3FREE,THYROIDAB in the last 72 hours No results found for this basename: VITAMINB12:2,FOLATE:2,FERRITIN:2,TIBC:2,IRON:2,RETICCTPCT:2 in the last 72 hours Lab Results  Component Value Date   HGBA1C 6.0 07/12/2011    The CrCl is unknown because both a height and weight (above a minimum accepted value) are required for this calculation. ABG    Component Value Date/Time   TCO2 24 06/19/2008 1647     Lab Results  Component Value Date   DDIMER  Value: 0.58        AT THE INHOUSE ESTABLISHED CUTOFF VALUE OF 0.48 ug/mL FEU, THIS  ASSAY HAS BEEN DOCUMENTED IN THE LITERATURE TO HAVE* 06/19/2008     Other results:  I have pearsonaly reviewed this: ECG REPORT  Rate: 63  Rhythm: NSR ST&T Change: flatned T waves in V2 and V3 which is a change from prior  UA no evidence of infection   Cultures: No results found for this basename: sdes, specrequest, cult, reptstatus       Radiological Exams on Admission: Dg Chest 2 View  09/01/2012  *RADIOLOGY REPORT*  Clinical Data: Left-sided chest pain  CHEST - 2 VIEW  Comparison: None.  Findings: Normal mediastinum and cardiac silhouette.  Normal pulmonary  vasculature.  No evidence of effusion, infiltrate, or pneumothorax.  No acute bony abnormality. No acute cardiopulmonary process.  IMPRESSION: No acute cardiopulmonary process.   Original Report Authenticated By: Genevive Bi, M.D.    Ct Head Wo Contrast  09/01/2012  *RADIOLOGY REPORT*  Clinical Data: Left-sided numbness and weakness.  Headache.  CT HEAD WITHOUT CONTRAST  Technique:  Contiguous axial images were obtained from the base of the skull through the vertex without contrast.  Comparison: 07/19/2011 MR head  Findings:  There is no evidence for acute infarction, intracranial hemorrhage, mass lesion, hydrocephalus, or extra-axial fluid.Mild atrophy. Subcentimeter subcortical right parietal white matter infarct stable compared with prior MR. Expanded sella turcica filled with CSF, representing stable empty sella.  Calvarium intact.  Clear sinuses and mastoids.  IMPRESSION: Stable empty sella appearance.  No acute intracranial findings. Mild atrophy.   Original Report Authenticated By: Elsie Stain, M.D.     Chart has been reviewed  Assessment/Plan  66 yo W atypical chest pain and neurologic changes worrisome for TIA vs CVA  Present on Admission:  .TIA (transient ischemic attack)  - will admit based on TIA/CVA protocol, await results of MRA/MRI, Carotid Doppler and Echo, obtain cardiac enzymes,  ECG, Homocysteine,  Lipid panel,  TSH. Order PT/OT evaluation. Will make sure patient is on antiplatelet agent. Neurology consult pending.      .Arm numbness left - PT/OT eval .Chest pain - atypical but given risk factors will admit and evaluate with serial ECG and troponin .Hemiplegia affecting left nondominant side - will have PT/OT eval   Prophylaxis: SCD , Protonix  CODE STATUS: FULL CODE  Other plan as per orders.  I have spent a total of 55 min on this admission  Dain Laseter 09/01/2012, 11:18 PM

## 2012-09-01 NOTE — ED Notes (Signed)
Patient currently resting quietly in bed; no respiratory or acute distress noted.  Patient updated on plan of care; informed patient that we are currently waiting on admitting MD to come and see patient.  Patient has no other questions or concerns at this time; will continue to monitor.

## 2012-09-01 NOTE — ED Notes (Signed)
Went to administer nitro SL tablets; patient currently in CT/x-ray.  Will continue to monitor.

## 2012-09-01 NOTE — ED Notes (Signed)
Patient currently sitting up in bed; no respiratory or acute distress noted.  Patient updated on plan of care; informed patient that EDP has made consult to neurology and hospitalist.  Patient has no other questions or concerns at this time; will continue to monitor.

## 2012-09-01 NOTE — ED Notes (Signed)
Phlebotomist at bedside.

## 2012-09-01 NOTE — ED Notes (Signed)
Patient transported to CT 

## 2012-09-01 NOTE — Consult Note (Signed)
Reason for Consult:Left arm pain Referring Physician: Bonk  CC: Left arm and chest pain   HPI: [name] is an 66 y.o. female who reports that she was bending down to pick up some shoes this afternoon at about 3pm and had the acute onset of pain under her left breast that radiated along her rib cage, under her arm and then down the left arm.  The left arm felt heavy at that time as well.  She has continued to have this pain and rates it at a 6/10.  Although she no longer feels the heaviness she does not feel that she is back to baseline.  She has had no involvement of the face or the leg on the left.  CT of the head was performed that shows an old lacunar infarct but no acute changes.    Past Medical History  Diagnosis Date  . Allergic rhinitis   . Asthma   . GERD (gastroesophageal reflux disease)   . Hyperlipidemia   . Vocal cord dysfunction   . Esophageal stricture   . Colonic polyp   . Osteoporosis   . DJD (degenerative joint disease) of knee     bilateral  . Hiatal hernia   . DDD (degenerative disc disease), lumbar 07/20/2011  . DJD (degenerative joint disease), lumbar 07/20/2011  . Diabetes mellitus, type 2     does not take medication; pt denies    Past Surgical History  Procedure Date  . Carpal tunnel release     bilateral  . Total abdominal hysterectomy   . Foot surgery     bilateral    Family History  Problem Relation Age of Onset  . Cancer Sister   . Heart attack Mother   . Asthma Brother   . Asthma      uncle  . Colon cancer Father 70  . Stomach cancer Neg Hx     Social History:  reports that she quit smoking about 31 years ago. Her smoking use included Cigarettes. She has a 35 pack-year smoking history. She has never used smokeless tobacco. She reports that she does not drink alcohol or use illicit drugs.  She stays home and takes care of her husband who is in a wheelchair.  Allergies  Allergen Reactions  . Penicillins Itching and Rash     Medications: I have reviewed the patient's current medications. Prior to Admission:  Current outpatient prescriptions:albuterol (PROVENTIL HFA) 108 (90 BASE) MCG/ACT inhaler, Inhale 2 puffs into the lungs every 4 (four) hours as needed. For shortness of breath/wheezing., Disp: , Rfl: ;  albuterol (PROVENTIL) (2.5 MG/3ML) 0.083% nebulizer solution, Take 2.5 mg by nebulization every 4 (four) hours as needed. For shortness of breath., Disp: , Rfl: ;  aspirin EC 81 MG tablet, Take 81 mg by mouth daily., Disp: , Rfl:  BLACK COHOSH PO, Take 1 capsule by mouth daily. , Disp: , Rfl: ;  budesonide-formoterol (SYMBICORT) 160-4.5 MCG/ACT inhaler, Inhale 2 puffs into the lungs 2 (two) times daily., Disp: , Rfl: ;  fexofenadine (ALLEGRA) 180 MG tablet, Take 180 mg by mouth daily., Disp: , Rfl: ;  fish oil-omega-3 fatty acids 1000 MG capsule, Take 1 g by mouth daily., Disp: , Rfl:  fluticasone (FLONASE) 50 MCG/ACT nasal spray, Place 1 spray into the nose 2 (two) times daily. , Disp: , Rfl: ;  naproxen sodium (ANAPROX) 220 MG tablet, Take 440 mg by mouth daily as needed. For headache, Disp: , Rfl: ;  omeprazole (PRILOSEC) 20  MG capsule, Take 20 mg by mouth 2 (two) times daily., Disp: , Rfl: ;  pravastatin (PRAVACHOL) 20 MG tablet, Take 20 mg by mouth every morning. , Disp: , Rfl:  DISCONTD: budesonide-formoterol (SYMBICORT) 160-4.5 MCG/ACT inhaler, Inhale 2 puffs into the lungs 2 (two) times daily., Disp: 1 Inhaler, Rfl: 0;  aspirin 81 MG chewable tablet, Chew 324 mg by mouth once., Disp: , Rfl:   ROS: History obtained from the patient  General ROS: negative for - chills, fatigue, fever, night sweats, weight gain or weight loss Psychological ROS: negative for - behavioral disorder, hallucinations, memory difficulties, mood swings or suicidal ideation Ophthalmic ROS: negative for - blurry vision, double vision, eye pain or loss of vision ENT ROS: negative for - epistaxis, nasal discharge, oral lesions, sore  throat, tinnitus or vertigo Allergy and Immunology ROS: negative for - hives or itchy/watery eyes Hematological and Lymphatic ROS: negative for - bleeding problems, bruising or swollen lymph nodes Endocrine ROS: negative for - galactorrhea, hair pattern changes, polydipsia/polyuria or temperature intolerance Respiratory ROS: negative for - cough, hemoptysis, shortness of breath or wheezing Cardiovascular ROS: as noted in HPI Gastrointestinal ROS: negative for - abdominal pain, diarrhea, hematemesis, nausea/vomiting or stool incontinence Genito-Urinary ROS: negative for - dysuria, hematuria, incontinence or urinary frequency/urgency Musculoskeletal ROS: intermittent spasms in the left leg Neurological ROS: as noted in HPI Dermatological ROS: negative for rash and skin lesion changes  Physical Examination: Blood pressure 120/59, pulse 67, temperature 97.8 F (36.6 C), temperature source Oral, resp. rate 17, SpO2 97.00%.  Neurologic Examination Mental Status: Alert, oriented, thought content appropriate.  Speech fluent without evidence of aphasia.  Able to follow 3 step commands without difficulty. Cranial Nerves: II: Discs flat bilaterally; Visual fields grossly normal, pupils equal, round, reactive to light and accommodation III,IV, VI: ptosis not present, extra-ocular motions intact bilaterally V,VII: smile symmetric, facial light touch sensation normal bilaterally VIII: hearing normal bilaterally IX,X: gag reflex present XI: bilateral shoulder shrug XII: midline tongue extension Motor: Right : Upper extremity   5/5    Left:     Upper extremity   5/5  Lower extremity   5/5     Lower extremity   5/5 Tone and bulk:normal tone throughout; no atrophy noted Sensory: Pinprick and light touch intact throughout, bilaterally Deep Tendon Reflexes: 2+ and symmetric with absent AJ's bilaterally Plantars: Right: downgoing   Left: downgoing Cerebellar: normal finger-to-nose and normal  heel-to-shin test Gait: not tested CV: pulses palpable throughout   Laboratory Studies:   Basic Metabolic Panel:  Lab 09/01/12 1610  NA 139  K 4.0  CL 105  CO2 26  GLUCOSE 100*  BUN 10  CREATININE 0.85  CALCIUM 9.4  MG 2.2  PHOS --    Liver Function Tests:  Lab 09/01/12 1912  AST 19  ALT 13  ALKPHOS 77  BILITOT 0.2*  PROT 6.7  ALBUMIN 3.3*   No results found for this basename: LIPASE:5,AMYLASE:5 in the last 168 hours No results found for this basename: AMMONIA:3 in the last 168 hours  CBC: No results found for this basename: WBC:5,NEUTROABS:5,HGB:5,HCT:5,MCV:5,PLT:5 in the last 168 hours  Cardiac Enzymes:  Lab 09/01/12 2018  CKTOTAL --  CKMB --  CKMBINDEX --  TROPONINI <0.30    BNP: No components found with this basename: POCBNP:5  CBG: No results found for this basename: GLUCAP:5 in the last 168 hours  Microbiology: No results found for this or any previous visit.  Coagulation Studies: No results found for this basename:  LABPROT:5,INR:5 in the last 72 hours  Urinalysis:  Lab 09/01/12 2014  COLORURINE YELLOW  LABSPEC 1.021  PHURINE 5.5  GLUCOSEU NEGATIVE  HGBUR NEGATIVE  BILIRUBINUR NEGATIVE  KETONESUR NEGATIVE  PROTEINUR NEGATIVE  UROBILINOGEN 1.0  NITRITE NEGATIVE  LEUKOCYTESUR TRACE*    Lipid Panel:     Component Value Date/Time   CHOL 149 07/05/2010 0905   TRIG 49.0 07/05/2010 0905   HDL 50.50 07/05/2010 0905   CHOLHDL 3 07/05/2010 0905   VLDL 9.8 07/05/2010 0905   LDLCALC 89 07/05/2010 0905    HgbA1C:  Lab Results  Component Value Date   HGBA1C 6.0 07/12/2011    Urine Drug Screen:   No results found for this basename: labopia, cocainscrnur, labbenz, amphetmu, thcu, labbarb    Alcohol Level: No results found for this basename: ETH:2 in the last 168 hours  Imaging: Dg Chest 2 View  09/01/2012  *RADIOLOGY REPORT*  Clinical Data: Left-sided chest pain  CHEST - 2 VIEW  Comparison: None.  Findings: Normal mediastinum and cardiac  silhouette.  Normal pulmonary  vasculature.  No evidence of effusion, infiltrate, or pneumothorax.  No acute bony abnormality. No acute cardiopulmonary process.  IMPRESSION: No acute cardiopulmonary process.   Original Report Authenticated By: Genevive Bi, M.D.    Ct Head Wo Contrast  09/01/2012  *RADIOLOGY REPORT*  Clinical Data: Left-sided numbness and weakness.  Headache.  CT HEAD WITHOUT CONTRAST  Technique:  Contiguous axial images were obtained from the base of the skull through the vertex without contrast.  Comparison: 07/19/2011 MR head  Findings:  There is no evidence for acute infarction, intracranial hemorrhage, mass lesion, hydrocephalus, or extra-axial fluid.Mild atrophy. Subcentimeter subcortical right parietal white matter infarct stable compared with prior MR. Expanded sella turcica filled with CSF, representing stable empty sella.  Calvarium intact.  Clear sinuses and mastoids.  IMPRESSION: Stable empty sella appearance.  No acute intracranial findings. Mild atrophy.   Original Report Authenticated By: Elsie Stain, M.D.      Assessment/Plan:  66 yo female complaining of pain in the left chest and left upper extremity.  Reports that with the pain there was some heaviness of the left upper extremity as well.  No significant findings on exam.  CT of the head unremarkable.  Complaints most prominent for pain which is somewhat inconsistent for what I would expect for a TIA.  Would like to rule out a possible cervical etiology.    Recommendations: 1. MRI of the cervical spine without contrast 2. MRI of the brain to rule out possible stroke.  Would not initiate stroke work up unless MRI remarkable for an acute infarct.  Patient does have a small lacune on CT imaging that is old with stroke risk factors of diabetes and hyperlipidemia, therefore would continue an aspirin a day.     Thana Farr, MD [facility] [phone] 09/01/2012, 11:27 PM

## 2012-09-01 NOTE — ED Notes (Signed)
Per EMS, pt reported sudden onset CP described as 10/10 sharp and radiated to arm and back.  Pt given 2 SL Nitro and 324 mg ASA by EMS with little pain relief 8/10.  EMS reports EKG unremarkable.  No cardiac hx.

## 2012-09-01 NOTE — ED Notes (Signed)
Admitting MD at bedside.

## 2012-09-02 ENCOUNTER — Inpatient Hospital Stay (HOSPITAL_COMMUNITY): Payer: Medicare Other

## 2012-09-02 DIAGNOSIS — R209 Unspecified disturbances of skin sensation: Secondary | ICD-10-CM

## 2012-09-02 DIAGNOSIS — M79609 Pain in unspecified limb: Secondary | ICD-10-CM

## 2012-09-02 LAB — GLUCOSE, CAPILLARY
Glucose-Capillary: 103 mg/dL — ABNORMAL HIGH (ref 70–99)
Glucose-Capillary: 86 mg/dL (ref 70–99)
Glucose-Capillary: 77 mg/dL (ref 70–99)

## 2012-09-02 LAB — LIPID PANEL
Cholesterol: 121 mg/dL (ref 0–200)
Triglycerides: 53 mg/dL (ref <150)

## 2012-09-02 LAB — TROPONIN I
Troponin I: <0.3 ng/mL (ref <0.30)
Troponin I: <0.3 ng/mL (ref <0.30)

## 2012-09-02 LAB — HEMOGLOBIN A1C
Hgb A1c MFr Bld: 5.9 % — ABNORMAL HIGH (ref <5.7)
Mean Plasma Glucose: 123 mg/dL — ABNORMAL HIGH (ref <117)

## 2012-09-02 MED ORDER — GUAIFENESIN ER 600 MG PO TB12
600.0000 mg | ORAL_TABLET | Freq: Two times a day (BID) | ORAL | Status: DC
  Administered 2012-09-02 (×2): 600 mg via ORAL
  Filled 2012-09-02 (×3): qty 1

## 2012-09-02 MED ORDER — INFLUENZA VIRUS VACC SPLIT PF IM SUSP: 0.5000 mL | INTRAMUSCULAR | Status: DC

## 2012-09-02 MED ORDER — ACETAMINOPHEN 325 MG PO TABS: 650.0000 mg | ORAL_TABLET | ORAL | Status: DC | PRN

## 2012-09-02 MED ORDER — ALBUTEROL SULFATE (5 MG/ML) 0.5% IN NEBU
2.5000 mg | INHALATION_SOLUTION | RESPIRATORY_TRACT | Status: DC | PRN
  Administered 2012-09-02 (×2): 2.5 mg via RESPIRATORY_TRACT
  Filled 2012-09-02 (×2): qty 0.5

## 2012-09-02 MED ORDER — BUDESONIDE-FORMOTEROL FUMARATE 160-4.5 MCG/ACT IN AERO
2.0000 | INHALATION_SPRAY | Freq: Two times a day (BID) | RESPIRATORY_TRACT | Status: DC
  Administered 2012-09-02: 2 via RESPIRATORY_TRACT
  Filled 2012-09-02: qty 6

## 2012-09-02 MED ORDER — FLUTICASONE PROPIONATE 50 MCG/ACT NA SUSP
1.0000 | Freq: Two times a day (BID) | NASAL | Status: DC
  Administered 2012-09-02: 1 via NASAL
  Filled 2012-09-02: qty 16

## 2012-09-02 MED ORDER — SIMVASTATIN 10 MG PO TABS
10.0000 mg | ORAL_TABLET | Freq: Every day | ORAL | Status: DC
  Filled 2012-09-02: qty 1

## 2012-09-02 MED ORDER — NAPROXEN SODIUM 220 MG PO TABS: 220.0000 mg | ORAL_TABLET | Freq: Every day | ORAL | Status: DC | PRN

## 2012-09-02 MED ORDER — OMEGA-3-ACID ETHYL ESTERS 1 G PO CAPS
1.0000 g | ORAL_CAPSULE | Freq: Every day | ORAL | Status: DC
  Administered 2012-09-02: 1 g via ORAL
  Filled 2012-09-02: qty 1

## 2012-09-02 MED ORDER — SODIUM CHLORIDE 0.9 % IV SOLN
INTRAVENOUS | Status: AC
  Administered 2012-09-02: 02:00:00 via INTRAVENOUS

## 2012-09-02 MED ORDER — ASPIRIN 81 MG PO CHEW
324.0000 mg | CHEWABLE_TABLET | Freq: Once | ORAL | Status: AC
  Administered 2012-09-02: 324 mg via ORAL
  Filled 2012-09-02: qty 4

## 2012-09-02 MED ORDER — LORATADINE 10 MG PO TABS
10.0000 mg | ORAL_TABLET | Freq: Every day | ORAL | Status: DC
  Administered 2012-09-02: 10 mg via ORAL
  Filled 2012-09-02: qty 1

## 2012-09-02 MED ORDER — PANTOPRAZOLE SODIUM 40 MG PO TBEC
40.0000 mg | DELAYED_RELEASE_TABLET | Freq: Every day | ORAL | Status: DC
  Administered 2012-09-02: 40 mg via ORAL
  Filled 2012-09-02: qty 1

## 2012-09-02 NOTE — Discharge Summary (Signed)
Physician Discharge Summary  ZAYAH KEILMAN NWG:[id number] DOB: 1946-02-24 DOA: 09/01/2012  PCP: Oliver Barre, MD  Admit date: 09/01/2012 Discharge date: 09/02/2012  Recommendations for Outpatient Follow-up:  1. Consider repeat cardiac stress test  2. 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 CP - atypical. Resolved prior to admission. No ekg changes, troponin normal. Sounds musculoskeletal. F/u with pcp 2. Left arm numbness - seen by neuro . MRI C spine and MRI brain wnl - unclear etiology - resolved  3. Old CVA to continue baby aspirin - neurologically intact   Procedures:  MRI   Echocardiogram   Consultations:  Neurology   Discharge Exam: Filed Vitals:   09/02/12 0600 09/02/12 0833 09/02/12 1000 09/02/12 1335  BP: 133/70  125/66 129/81  Pulse: 78  80 75  Temp: 97.5 F (36.4 C)  98.3 F (36.8 C) 98.1 F (36.7 C)  TempSrc:    Oral  Resp: 20  18 18   Height:      Weight:      SpO2: 98% 98% 98% 99%    General: axox3 Cardiovascular: rrr Respiratory: ctab  Discharge Instructions  Discharge Orders    Future Appointments: Provider: Department: Dept Phone: Center:   09/03/2012 11:00 AM Hilarie Fredrickson, MD [facility] [phone] [facility]   11/20/2012 9:15 AM Corwin Levins, MD [facility] [phone] [facility]     Future Orders Please Complete By Expires   Diet - low sodium heart healthy      Increase activity slowly          Medication List     As of 09/02/2012  3:35 PM      STOP taking these medications         aspirin 81 MG chewable tablet      TAKE these medications         aspirin EC 81 MG tablet   Take 81 mg by mouth daily.      BLACK COHOSH PO   Take 1 capsule by mouth daily.      budesonide-formoterol 160-4.5 MCG/ACT inhaler   Commonly known as: SYMBICORT   Inhale 2 puffs into the lungs 2 (two) times daily.      fexofenadine 180 MG tablet   Commonly known as: ALLEGRA   Take 180 mg by mouth daily.      fish oil-omega-3 fatty acids 1000 MG capsule   Take 1 g by mouth daily.      fluticasone 50 MCG/ACT nasal spray   Commonly known as: FLONASE   Place 1 spray into the nose 2 (two) times daily.      naproxen sodium 220 MG tablet   Commonly known as: ANAPROX   Take 1 tablet (220 mg total) by mouth daily as needed. For headache      omeprazole 20 MG capsule   Commonly known as: PRILOSEC   Take 20 mg by mouth 2 (two) times daily.      pravastatin 20 MG tablet  Commonly known as: PRAVACHOL   Take 20 mg by mouth every morning.      PROVENTIL HFA 108 (90 BASE) MCG/ACT inhaler   Generic drug: albuterol   Inhale 2 puffs into the lungs every 4 (four) hours as needed. For shortness of breath/wheezing.      albuterol (2.5 MG/3ML) 0.083% nebulizer solution   Commonly known as: PROVENTIL   Take 2.5 mg by nebulization every 4 (four) hours as needed. For shortness of breath.           Follow-up Information    Schedule an appointment as soon as possible for a visit with Oliver Barre, MD.   Contact information:   [address] [place] [address] [phone]           The results of significant diagnostics from this hospitalization (including imaging, microbiology, ancillary and laboratory) are listed below for reference.    Significant Diagnostic Studies: Dg Chest 2 View  09/01/2012  *RADIOLOGY REPORT*  Clinical Data: Left-sided chest pain  CHEST - 2 VIEW  Comparison: None.  Findings: Normal  mediastinum and cardiac silhouette.  Normal pulmonary  vasculature.  No evidence of effusion, infiltrate, or pneumothorax.  No acute bony abnormality. No acute cardiopulmonary process.  IMPRESSION: No acute cardiopulmonary process.   Original Report Authenticated By: Genevive Bi, M.D.    Ct Head Wo Contrast  09/01/2012  *RADIOLOGY REPORT*  Clinical Data: Left-sided numbness and weakness.  Headache.  CT HEAD WITHOUT CONTRAST  Technique:  Contiguous axial images were obtained from the base of the skull through the vertex without contrast.  Comparison: 07/19/2011 MR head  Findings:  There is no evidence for acute infarction, intracranial hemorrhage, mass lesion, hydrocephalus, or extra-axial fluid.Mild atrophy. Subcentimeter subcortical right parietal white matter infarct stable compared with prior MR. Expanded sella turcica filled with CSF, representing stable empty sella.  Calvarium intact.  Clear sinuses and mastoids.  IMPRESSION: Stable empty sella appearance.  No acute intracranial findings. Mild atrophy.   Original Report Authenticated By: Elsie Stain, M.D.    Mri Brain Without Contrast  09/02/2012  *RADIOLOGY REPORT*  Clinical Data:   Atypical left arm pain and weakness.  Evaluate for cerebrovascular disease. Hyperlipidemia.  Diabetes.  MRI HEAD WITHOUT CONTRAST MRA HEAD WITHOUT CONTRAST  Technique:  Multiplanar, multiecho pulse sequences of the brain and surrounding structures were obtained without intravenous contrast. Angiographic images of the head were obtained using MRA technique without contrast.  Comparison:  CT head 09/01/2012.  MR head 07/19/2011.  MRI HEAD  Findings:  There is no evidence for acute infarction, intracranial hemorrhage, mass lesion, hydrocephalus, or extra-axial fluid. There is no significant cerebral or cerebellar atrophy.  Minimal chronic microvascular ischemic change in the periventricular and subcortical white matter is nonspecific, but could be related to early  diabetic type ischemic demyelination. Small chronic right parietal cortical infarct redemonstrated.  No midline shift.  No foci of chronic hemorrhage.  Empty sella.  No tonsillar herniation. Negative skull base and its calvarium.  No acute sinus or mastoid disease.  Negative orbits.  IMPRESSION: No acute intracranial findings.  No signs of acute right hemisphere ischemia.  Small chronic right parietal infarct stable from priors. Mild chronic microvascular ischemic change, possibly diabetes related.  Stable empty sella.  This is likely incidental.  MRA HEAD  Findings: The internal carotid arteries are widely patent.  The basilar artery is widely patent with the vertebrals codominant. The circle of Willis is intact  with well developed posterior communicating arteries and anterior communicating artery with codominant anterior cerebrals.  There is no proximal intracranial stenosis or aneurysm. Mild irregularity distal MCA and PCA branches suggests intracranial atherosclerotic change.  IMPRESSION: Unremarkable MR angiography intracranial circulation.  No proximal stenosis.   Original Report Authenticated By: Elsie Stain, M.D.    Mr Cervical Spine Wo Contrast  09/02/2012  *RADIOLOGY REPORT*  Clinical Data: Left upper extremity pain and weakness.  No evidence for acute infarction on brain MRI.  MRI CERVICAL SPINE WITHOUT CONTRAST  Technique:  Multiplanar and multiecho pulse sequences of the cervical spine, to include the craniocervical junction and cervicothoracic junction, were obtained according to standard protocol without intravenous contrast.  Comparison: MRI 02/06/2007.  Findings: Anatomic alignment except for 1 mm anterolisthesis C3-C4. Slight disc space narrowing C6-C7.  Marrow signal homogeneous. Normal cord size and signal.  Craniocervical junction unremarkable. No prevertebral soft tissue swelling. Shotty cervical adenopathy.  The individual disc spaces were examined as follows:  C2-3:  Normal.  C3-4:   Mild bulge. 1 mm anterolisthesis.  Asymmetric uncinate spurring and facet arthropathy on the left. No convincing C4 nerve root encroachment.  C4-5:  Normal interspace.  C5-6:  Normal interspace.  C6-7:  Mild disc space narrowing.  Mild annular bulging.  No disc protrusion or nerve root cut off.  C7-T1:  Normal interspace.  Mild facet arthropathy.  Compared with 2008, the disc protrusion at C6-7 has partially regressed.  The disc protrusion at C3-4 is slightly improved also.  IMPRESSION: Minor degenerative change without significant neural compressive lesion.  Specifically no significant left-sided pathology is evident.   Original Report Authenticated By: Elsie Stain, M.D.    Mr Mra Head/brain Wo Cm  09/02/2012  *RADIOLOGY REPORT*  Clinical Data:   Atypical left arm pain and weakness.  Evaluate for cerebrovascular disease. Hyperlipidemia.  Diabetes.  MRI HEAD WITHOUT CONTRAST MRA HEAD WITHOUT CONTRAST  Technique:  Multiplanar, multiecho pulse sequences of the brain and surrounding structures were obtained without intravenous contrast. Angiographic images of the head were obtained using MRA technique without contrast.  Comparison:  CT head 09/01/2012.  MR head 07/19/2011.  MRI HEAD  Findings:  There is no evidence for acute infarction, intracranial hemorrhage, mass lesion, hydrocephalus, or extra-axial fluid. There is no significant cerebral or cerebellar atrophy.  Minimal chronic microvascular ischemic change in the periventricular and subcortical white matter is nonspecific, but could be related to early diabetic type ischemic demyelination. Small chronic right parietal cortical infarct redemonstrated.  No midline shift.  No foci of chronic hemorrhage.  Empty sella.  No tonsillar herniation. Negative skull base and its calvarium.  No acute sinus or mastoid disease.  Negative orbits.  IMPRESSION: No acute intracranial findings.  No signs of acute right hemisphere ischemia.  Small chronic right parietal infarct  stable from priors. Mild chronic microvascular ischemic change, possibly diabetes related.  Stable empty sella.  This is likely incidental.  MRA HEAD  Findings: The internal carotid arteries are widely patent.  The basilar artery is widely patent with the vertebrals codominant. The circle of Willis is intact with well developed posterior communicating arteries and anterior communicating artery with codominant anterior cerebrals.  There is no proximal intracranial stenosis or aneurysm. Mild irregularity distal MCA and PCA branches suggests intracranial atherosclerotic change.  IMPRESSION: Unremarkable MR angiography intracranial circulation.  No proximal stenosis.   Original Report Authenticated By: Elsie Stain, M.D.     Microbiology: No results found for this or any  previous visit (from the past 240 hour(s)).   Labs: Basic Metabolic Panel:  Lab 09/01/12 1610  NA 139  K 4.0  CL 105  CO2 26  GLUCOSE 100*  BUN 10  CREATININE 0.85  CALCIUM 9.4  MG 2.2  PHOS --   Liver Function Tests:  Lab 09/01/12 1912  AST 19  ALT 13  ALKPHOS 77  BILITOT 0.2*  PROT 6.7  ALBUMIN 3.3*   No results found for this basename: LIPASE:5,AMYLASE:5 in the last 168 hours No results found for this basename: AMMONIA:5 in the last 168 hours CBC: No results found for this basename: WBC:5,NEUTROABS:5,HGB:5,HCT:5,MCV:5,PLT:5 in the last 168 hours Cardiac Enzymes:  Lab 09/02/12 1331 09/02/12 0744 09/02/12 0222 09/01/12 2018  CKTOTAL -- -- -- --  CKMB -- -- -- --  CKMBINDEX -- -- -- --  TROPONINI <0.30 <0.30 <0.30 <0.30   BNP: BNP (last 3 results)  Basename 09/01/12 1912  PROBNP 61.1   CBG:  Lab 09/02/12 1333 09/02/12 0757 09/02/12 0229  GLUCAP 77 86 103*    Time coordinating discharge: 40 minutes  Signed:  Helaina Stefano  [facility] 09/02/2012, 3:35 PM

## 2012-09-02 NOTE — Progress Notes (Signed)
Patient off the unit for testing.  Will resume Q 4 neuro checks and vital sign checks when patient returns. [name]

## 2012-09-02 NOTE — Progress Notes (Signed)
DC orders received.  Patient stable with no S/S of distress.  Discharge medication and instructions reviewed with patient and patient's family.  Patient DC home. [name], [name]  

## 2012-09-02 NOTE — ED Notes (Addendum)
Patient being transported upstairs by nurse tele tech on portable cardiac monitor.  Report given to Tresa Endo, RN on 3000.  No further questions/concerns from RN; informed RN she can call back with any questions/concerns once patient arrives to floor.

## 2012-09-02 NOTE — ED Notes (Signed)
Calling report now. 

## 2012-09-02 NOTE — Progress Notes (Signed)
  Echocardiogram 2D Echocardiogram has been performed.  [place] 09/02/2012, 2:08 PM

## 2012-09-02 NOTE — ED Notes (Signed)
Patient currently resting quietly in bed; no respiratory or acute distress noted.  Patient updated on plan of care; informed patient that room has been assigned and report is being called.  Patient has no other questions or concerns at this time; will continue to monitor.

## 2012-09-02 NOTE — Evaluation (Signed)
Physical Therapy Evaluation Patient Details Name: [name] MRN: [id number] DOB: 11-Apr-1946 Today's Date: 09/02/2012 Time: 1325-1340 PT Time Calculation (min): 15 min  PT Assessment / Plan / Recommendation Clinical Impression  Pt is independent with all mobility.  BLE strength appears symmetrical.    PT Assessment  Patent does not need any further PT services    Follow Up Recommendations  No PT follow up    Barriers to Discharge        Equipment Recommendations  None recommended by PT    Recommendations for Other Services     Frequency      Precautions / Restrictions Precautions Precautions: None   Pertinent Vitals/Pain 0/10      Mobility  Bed Mobility Bed Mobility: Supine to Sit;Sit to Supine Supine to Sit: 7: Independent Sit to Supine: 7: Independent Transfers Transfers: Sit to Stand;Stand to Sit;Stand Pivot Transfers Sit to Stand: 7: Independent Stand to Sit: 7: Independent Stand Pivot Transfers: 7: Independent Ambulation/Gait Ambulation/Gait Assistance: 6: Modified independent (Device/Increase time) Ambulation Distance (Feet): 300 Feet Assistive device: None Gait Pattern: Within Functional Limits    Shoulder Instructions     Exercises     PT Diagnosis:    PT Problem List:   PT Treatment Interventions:     PT Goals    Visit Information  Last PT Received On: 09/02/12 Assistance Needed: +1    Subjective Data  Subjective: "I feel better." Patient Stated Goal: feel better   Prior Functioning  Home Living Lives With: Spouse Available Help at Discharge: Family;Available PRN/intermittently Type of Home: House Home Access: Ramped entrance Home Layout: One level Home Adaptive Equipment: None Additional Comments: 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 Prior Function Level of Independence: Independent Able to Take Stairs?: Yes Driving: Yes Communication Communication: No difficulties Dominant Hand: Right      Cognition  Overall Cognitive Status: Appears within functional limits for tasks assessed/performed Arousal/Alertness: Awake/alert Orientation Level: Oriented X4 / Intact Behavior During Session: [facility] for tasks performed    Extremity/Trunk Assessment Right Lower Extremity Assessment RLE ROM/Strength/Tone: Within functional levels Left Lower Extremity Assessment LLE ROM/Strength/Tone: Within functional levels   Balance    End of Session PT - End of Session Equipment Utilized During Treatment: Gait belt Activity Tolerance: Patient tolerated treatment well Patient left: in bed;with call bell/phone within reach;with nursing in room  GP     Ilda Foil 09/02/2012, 2:53 PM

## 2012-09-03 ENCOUNTER — Encounter: Payer: Self-pay | Admitting: Internal Medicine

## 2012-09-03 ENCOUNTER — Encounter: Payer: Medicare Other | Admitting: Internal Medicine

## 2012-09-03 ENCOUNTER — Telehealth: Payer: Self-pay | Admitting: Internal Medicine

## 2012-09-06 NOTE — ED Provider Notes (Signed)
History     CSN: [id number]  Arrival date & time 09/01/12  1701   First MD Initiated Contact with Patient 09/01/12 1738      Chief Complaint  Patient presents with  . Chest Pain    (Consider location/radiation/quality/duration/timing/severity/associated sxs/prior treatment) Patient is a 66 y.o. female presenting with chest pain.  Chest Pain   [name] is a 66 y.o. female presenting with sudden onset chest pain, located in the center of the chest, it lasted less than 30 minutes has been coming and going, is been sharp and radiating to the back not associated shortness of breath, nausea vomiting or diaphoresis. Patient was given nitroglycerin and aspirin with little relief of pain.  eyes and a productive cough, fevers, chills. Patient does have a pertinent history of esophageal stricture with recurrent dilations as well as vocal cord dysfunction.    Past Medical History  Diagnosis Date  . Allergic rhinitis   . Asthma   . GERD (gastroesophageal reflux disease)   . Hyperlipidemia   . Vocal cord dysfunction   . Esophageal stricture   . Colonic polyp   . Osteoporosis   . DJD (degenerative joint disease) of knee     bilateral  . Hiatal hernia   . DDD (degenerative disc disease), lumbar 07/20/2011  . DJD (degenerative joint disease), lumbar 07/20/2011  . Diabetes mellitus, type 2     does not take medication; pt denies    Past Surgical History  Procedure Date  . Carpal tunnel release     bilateral  . Total abdominal hysterectomy   . Foot surgery     bilateral    Family History  Problem Relation Age of Onset  . Cancer Sister   . Heart attack Mother   . Asthma Brother   . Asthma      uncle  . Colon cancer Father 75  . Stomach cancer Neg Hx     History  Substance Use Topics  . Smoking status: Former Smoker -- 1.0 packs/day for 35 years    Types: Cigarettes    Quit date: 08/08/1981  . Smokeless tobacco: Never Used  . Alcohol Use: No    OB History    Grav Para Term Preterm Abortions TAB SAB Ect Mult Living                  Review of Systems  Cardiovascular: Positive for chest pain.   At least 10pt or greater review of systems completed and are negative except where specified in the HPI.  Allergies  Penicillins  Home Medications   Current Outpatient Rx  Name Route Sig Dispense Refill  . ALBUTEROL SULFATE HFA 108 (90 BASE) MCG/ACT IN AERS Inhalation Inhale 2 puffs into the lungs every 4 (four) hours as needed. For shortness of breath/wheezing.    . ALBUTEROL SULFATE (2.5 MG/3ML) 0.083% IN NEBU Nebulization Take 2.5 mg by nebulization every 4 (four) hours as needed. For shortness of breath.    . ASPIRIN EC 81 MG PO TBEC Oral Take 81 mg by mouth daily.    Marland Kitchen BLACK COHOSH PO Oral Take 1 capsule by mouth daily.     . BUDESONIDE-FORMOTEROL FUMARATE 160-4.5 MCG/ACT IN AERO Inhalation Inhale 2 puffs into the lungs 2 (two) times daily.    Marland Kitchen FEXOFENADINE HCL 180 MG PO TABS Oral Take 180 mg by mouth daily.    . OMEGA-3 FATTY ACIDS 1000 MG PO CAPS Oral Take 1 g by mouth daily.    Marland Kitchen  FLUTICASONE PROPIONATE 50 MCG/ACT NA SUSP Nasal Place 1 spray into the nose 2 (two) times daily.     Marland Kitchen OMEPRAZOLE 20 MG PO CPDR Oral Take 20 mg by mouth 2 (two) times daily.    Marland Kitchen PRAVASTATIN SODIUM 20 MG PO TABS Oral Take 20 mg by mouth every morning.     Marland Kitchen NAPROXEN SODIUM 220 MG PO TABS Oral Take 1 tablet (220 mg total) by mouth daily as needed. For headache      BP 129/81  Pulse 75  Temp 98.1 F (36.7 C) (Oral)  Resp 18  Ht 5\' 2"  (1.575 m)  Wt 201 lb (91.173 kg)  BMI 36.76 kg/m2  SpO2 99%  Physical Exam  Nursing notes reviewed.  Electronic medical record reviewed. VITAL SIGNS:   Filed Vitals:   09/02/12 0600 09/02/12 0833 09/02/12 1000 09/02/12 1335  BP: 133/70  125/66 129/81  Pulse: 78  80 75  Temp: 97.5 F (36.4 C)  98.3 F (36.8 C) 98.1 F (36.7 C)  TempSrc:    Oral  Resp: 20  18 18   Height:      Weight:      SpO2: 98% 98% 98% 99%    CONSTITUTIONAL: Awake, oriented, appears non-toxic HENT: Atraumatic, normocephalic, oral mucosa pink and moist, airway patent. Nares patent without drainage. External ears normal. EYES: Conjunctiva clear, EOMI, PERRLA NECK: Trachea midline, non-tender, supple CARDIOVASCULAR: Normal heart rate, Normal rhythm  PULMONARY/CHEST: Clear to auscultation, no rhonchi, wheezes, or rales. Symmetrical breath sounds. Non-tender. ABDOMINAL: Non-distended, soft, non-tender - no rebound or guarding.  BS normal. NEUROLOGIC: Non-focal, moving all four extremities, no gross sensory or motor deficits. EXTREMITIES: No clubbing, cyanosis, or edema SKIN: Warm, Dry, No erythema, No rash  ED Course  Procedures (including critical care time)  Date: 09/06/2012  Rate: 73  Rhythm: normal sinus rhythm  QRS Axis: normal  Intervals: normal  ST/T Wave abnormalities: normal  Conduction Disutrbances: none  Narrative Interpretation: unremarkable   Labs Reviewed  COMPREHENSIVE METABOLIC PANEL - Abnormal; Notable for the following:    Glucose, Bld 100 (*)     Albumin 3.3 (*)     Total Bilirubin 0.2 (*)     GFR calc non Af Amer 69 (*)     GFR calc Af Amer 80 (*)     All other components within normal limits  URINALYSIS, ROUTINE W REFLEX MICROSCOPIC - Abnormal; Notable for the following:    APPearance CLOUDY (*)     Leukocytes, UA TRACE (*)     All other components within normal limits  URINE MICROSCOPIC-ADD ON - Abnormal; Notable for the following:    Squamous Epithelial / LPF MANY (*)     All other components within normal limits  HEMOGLOBIN A1C - Abnormal; Notable for the following:    Hemoglobin A1C 5.9 (*)     Mean Plasma Glucose 123 (*)     All other components within normal limits  GLUCOSE, CAPILLARY - Abnormal; Notable for the following:    Glucose-Capillary 103 (*)     All other components within normal limits  MAGNESIUM  PRO B NATRIURETIC PEPTIDE  TROPONIN I  LIPID PANEL  TROPONIN I  TROPONIN  I  TROPONIN I  GLUCOSE, CAPILLARY  GLUCOSE, CAPILLARY  LAB REPORT - SCANNED   No results found.   1. TIA (transient ischemic attack)   2. Chest pain   3. Arm numbness left   4. Unspecified asthma   5. Hemiplegia affecting left nondominant side  6. Pain in limb   7. HERPES ZOSTER   8. NEVUS   9. DIABETES MELLITUS, TYPE II   10. HYPERLIPIDEMIA   11. PANIC ATTACK   12. SUPERFICIAL THROMBOPHLEBITIS   13. ALLERGIC RHINITIS   14. VOCAL CORD DISORDER   15. ASTHMA, MILD   16. ESOPHAGEAL STRICTURE   17. Esophageal reflux   18. VAGINITIS   19. MENOPAUSAL DISORDER   20. OSTEOARTHRITIS, KNEES, BILATERAL   21. FOOT PAIN, RIGHT   22. OSTEOPOROSIS   23. FATIGUE   24. SNORING   25. DYSPHAGIA UNSPECIFIED   26. COLONIC POLYPS, HX OF   27. TRIGGER FINGER, RIGHT MIDDLE   28. Other dysphagia   29. Preventative health care   30. Syncope   31. Acute lumbar radiculopathy   32. DDD (degenerative disc disease), lumbar   33. DJD (degenerative joint disease), lumbar   34. CVA (cerebral infarction)   35. Migraine       MDM  [name] is a 66 y.o. female presenting with chest pain-certainly seems atypical this time however patient has history of "mild" CVA, diabetes and hypertension. Patient is normal EKG. Patient also has history of recurrent esophageal dilations perhaps related to esophageal spasm however did not have any relief really from nitroglycerin. Laboratory workup including cardiac biomarkers are negative. Patient is 66 years old a bleed at moderate risk for acute coronary syndrome not suitable for rule out in the emergency department or in the clinical decision unit. She'll require admission for further stratification and cardiac event rule out.         Jones Skene, MD 09/06/12 [phone]

## 2012-09-14 ENCOUNTER — Encounter: Payer: Self-pay | Admitting: Internal Medicine

## 2012-09-14 NOTE — Telephone Encounter (Signed)
Pt Rescheduled for 09-20-12/yf

## 2012-09-20 ENCOUNTER — Ambulatory Visit (AMBULATORY_SURGERY_CENTER): Payer: Medicare Other | Admitting: Internal Medicine

## 2012-09-20 ENCOUNTER — Encounter: Payer: Self-pay | Admitting: Internal Medicine

## 2012-09-20 VITALS — BP 143/77 | HR 70 | Temp 98.5°F | Resp 20 | Ht 62.0 in | Wt 202.0 lb

## 2012-09-20 DIAGNOSIS — Z1211 Encounter for screening for malignant neoplasm of colon: Secondary | ICD-10-CM

## 2012-09-20 DIAGNOSIS — D126 Benign neoplasm of colon, unspecified: Secondary | ICD-10-CM

## 2012-09-20 DIAGNOSIS — Z86010 Personal history of colonic polyps: Secondary | ICD-10-CM

## 2012-09-20 DIAGNOSIS — Z800 Family history of malignant neoplasm of digestive organs: Secondary | ICD-10-CM

## 2012-09-20 MED ORDER — SODIUM CHLORIDE 0.9 % IV SOLN: 500.0000 mL | INTRAVENOUS | Status: DC

## 2012-09-20 NOTE — Patient Instructions (Signed)

## 2012-09-20 NOTE — Progress Notes (Signed)
Pt wears an upper denture plate, but it is not glued in today.  Pt also has her symbacort inhaler in her hand.  Maw

## 2012-09-20 NOTE — Op Note (Signed)
[facility] [address] [place], [address]   COLONOSCOPY PROCEDURE REPORT  PATIENT: [name], [name]  MR#: [id number] BIRTHDATE: 01/12/1946 , 66  yrs. old GENDER: Female ENDOSCOPIST: Roxy Cedar, MD REFERRED XB:[id number] Program Recall PROCEDURE DATE:  09/20/2012 PROCEDURE:   Colonoscopy with snare polypectomy    x 2 ASA CLASS:   Class II INDICATIONS:patient's personal history of adenomatous colon polyps and patient's immediate family history of colon cancer. MEDICATIONS: MAC sedation, administered by CRNA and propofol (Diprivan) 280mg  IV  DESCRIPTION OF PROCEDURE:   After the risks benefits and alternatives of the procedure were thoroughly explained, informed consent was obtained.  A digital rectal exam revealed no abnormalities of the rectum.   The LB CF-[id number]  endoscope was introduced through the anus and advanced to the cecum, which was identified by both the appendix and ileocecal valve. No adverse events experienced.   The quality of the prep was good, using MoviPrep  The instrument was then slowly withdrawn as the colon was fully examined.      COLON FINDINGS: Two diminutive polyps were found in the ascending colon and transverse colon.  A polypectomy was performed with a cold snare.  The resection was complete and the polyp tissue was completely retrieved.   The colon was otherwise normal.  There was no diverticulosis, inflammation, or cancers .  Retroflexed views revealed no abnormalities. The time to cecum=3 minutes 45 seconds. Withdrawal time=13 minutes 35 seconds.  The scope was withdrawn and the procedure completed. COMPLICATIONS: There were no complications.  ENDOSCOPIC IMPRESSION: 1.   Two diminutive polyps were found in the ascending colon and transverse colon; polypectomy was performed with a cold snare 2.   The colon was otherwise normal  RECOMMENDATIONS: 1. Follow up colonoscopy in 5 years   eSigned:  Roxy Cedar, MD 09/20/2012 2:44 PM   cc: The Patient and Corwin Levins, MD   PATIENT NAME:  [name], [name] MR#: [id number]

## 2012-09-20 NOTE — Progress Notes (Signed)
The pt denies having DM.  She said when she takes steroids for her asthma that it runs her blood sugar up, but she does not take any medications for DM. Maw

## 2012-09-20 NOTE — Progress Notes (Signed)
Patient did not have preoperative order for IV antibiotic SSI prophylaxis. (G8918)  Patient did not experience any of the following events: a burn prior to discharge; a fall within the facility; wrong site/side/patient/procedure/implant event; or a hospital transfer or hospital admission upon discharge from the facility. (G8907)  

## 2012-09-21 ENCOUNTER — Telehealth: Payer: Self-pay

## 2012-09-21 NOTE — Telephone Encounter (Signed)
  Follow up Call-  Call back number 09/20/2012  Post procedure Call Back phone  # [phone] cell   Permission to leave phone message Yes     Patient questions:  Do you have a fever, pain , or abdominal swelling? no Pain Score  0 *  Have you tolerated food without any problems? yes  Have you been able to return to your normal activities? yes  Do you have any questions about your discharge instructions: Diet   no Medications  no Follow up visit  no  Do you have questions or concerns about your Care? no  Actions: * If pain score is 4 or above: No action needed, pain <4.

## 2012-09-25 ENCOUNTER — Encounter: Payer: Self-pay | Admitting: Internal Medicine

## 2012-10-10 ENCOUNTER — Telehealth: Payer: Self-pay | Admitting: Emergency Medicine

## 2012-10-10 ENCOUNTER — Ambulatory Visit (INDEPENDENT_AMBULATORY_CARE_PROVIDER_SITE_OTHER): Payer: Medicare Other

## 2012-10-10 DIAGNOSIS — Z23 Encounter for immunization: Secondary | ICD-10-CM

## 2012-10-10 MED ORDER — BUDESONIDE-FORMOTEROL FUMARATE 160-4.5 MCG/ACT IN AERO: 2.0000 | INHALATION_SPRAY | Freq: Two times a day (BID) | RESPIRATORY_TRACT | Status: DC

## 2012-10-10 NOTE — Telephone Encounter (Signed)
I do not have these papers.  I called and spoke with patient she states she does not know when she brought paperwork to office but is quite sure it  was well over 6 months ago.  I informed patient that I would check with RB but it may be that she will have to come in and fill out the paperwork.  Pt verbalized understanding of this and nothing further needed at this time.

## 2012-10-10 NOTE — Telephone Encounter (Signed)
I spoke with patient-states she brought in the pt assistance for Symbicort for RB to fill out and send back. I gave one sample of Symbicort and will forward to Lauren to address this with RB.

## 2012-10-12 NOTE — Telephone Encounter (Signed)
I spoke with pt and advised her will leave forms upfront for her to pick up. She voiced her understanding and needed nothing further

## 2012-11-16 ENCOUNTER — Other Ambulatory Visit: Payer: Self-pay | Admitting: Internal Medicine

## 2012-11-20 ENCOUNTER — Ambulatory Visit: Payer: Medicare Other | Admitting: Internal Medicine

## 2012-12-10 ENCOUNTER — Ambulatory Visit: Payer: Medicare Other | Admitting: Emergency Medicine

## 2012-12-10 ENCOUNTER — Encounter: Payer: Self-pay | Admitting: Internal Medicine

## 2012-12-10 ENCOUNTER — Ambulatory Visit (INDEPENDENT_AMBULATORY_CARE_PROVIDER_SITE_OTHER): Payer: Medicare Other | Admitting: Internal Medicine

## 2012-12-10 VITALS — BP 130/82 | HR 82 | Temp 97.9°F | Ht 62.0 in | Wt 196.2 lb

## 2012-12-10 DIAGNOSIS — J45909 Unspecified asthma, uncomplicated: Secondary | ICD-10-CM

## 2012-12-10 DIAGNOSIS — K219 Gastro-esophageal reflux disease without esophagitis: Secondary | ICD-10-CM

## 2012-12-10 MED ORDER — PREDNISONE (PAK) 10 MG PO TABS: ORAL_TABLET | ORAL | Status: DC

## 2012-12-10 NOTE — Progress Notes (Signed)
Subjective:    Patient ID: [name], female    DOB: 1946/05/16   MRN: [id number] HPI 67 -year-old woman with a history of asthma and vocal cord dysfunction both of which have been significantly impacted by severe esophageal reflux. Has a hx esophageal stricture dilations by Dr Marina Goodell. On omeprazole 20mg  two times a day. Uses Symbicort bid.   ROV 05/20/10 -- regular f/u for VCD and asthma. No problems since last visit. Has had good control of both her GERD and UA irritation right now. Takes Symbicort reliably. Has been walking, has lost 9 lbs since last visit. Mentions today that she has a nevus on her chest that has become painful when her clothes rub against it.   ROV 09/07/10 -- f/u for allergies, GERD, VCD, asthma. Gained back 4 lbs. For the last 3 weeks more trouble with SOB and UA wheezing. Using SABA 1x a day. Taking Symbicort two times a day. She was recently started on clonidine in August, she questions whether this is a factor in her SOB. Occas globus sensation. Freq cough. No real heartburn symptoms. Occas gets cough and irritation from using symbicort.   ROV 10/08/10 -- allergies, GERD, VCD > asthma. Returns for f/u. last time we stopped clonidine to see if it would help her breathing - no change. Has continued cough, UA wheezing, throat clearing, tickle in her throat. Swallowing is ok. She has globus sensation, but doesn't clear phlegm. Reliable about her omeprazole two times a day.   11/18/10 -- VCD, GERD, allergies, probable coexisting asthma. Returns for f/u. Last time restarted loratadine, fluticasone. She was already on omeprazole. Her breathing is better. Today she mentions some dizziness, no syncope, can happen with sitting or standing. The room spins. No CP, no dyspnea, no palpitations. Remains off clonidine, stopped in October to see if it would help breathing. BP is OK (she was on it for sweats).   ROV 05/11/11 -- Hx of VCD and asthma that have been exacerbated by GERD and  allergic rhinitis. She returns reporting more DOE over about the last 5 -6 months. Her wt has been stable at 206. She is not having any throat symptoms, has a strong voice. Rare SABA use, but she did need it more frequently in March. She is taking her fexofenadine, fluticasone bid, omeprazole. Symbicort bid.   ROV 06/06/2011  Presents for an acute office visit. Complains of increased SOB, wheezing, prod cough with yellow mucus x 3days. Wheezing is getting worse today. She is under a lot of stress, tired a lot . Husband had stroke 3 years ago, requires total care- at home by her.  OTC not helping. Has some drainage.   ROV 11/10/11 -- Hx of VCD and asthma that have been exacerbated by GERD and allergic rhinitis. Returns for f/u. Since last visit she had a normal nuclear cardiac stress test. She tells me that she had an episode last week when she needed her albuterol more - ? After exposure to perfume.  Still uses Symbicort bid, albuterol prn. Her voice has been strong, taking her allergy regimen and PPI. Needs Flu shot.   Acute OV 04/16/12 Complains increased SOB, wheezing, tightness in chest, dry cough, chest congestion x3 days.  Increased wheezing and dyspnea . Using her albuterol neb more over last 3 days.  No fever or discolored. No recent travel or abx use.  Has been doing well until last few days. No ER or Hospital visit. Rare use of SABA until this illness.  Under some stress with sick husband  ROV 06/19/12 -- Hx of VCD and asthma that have been exacerbated by GERD and allergic rhinitis. Formerly on chronic steroids but we successfully weaned to off. She tells me that she was admitted to [facility] for HA and CP, L arm numbness - ECG was reassuring. During that admit she had more trouble w her breathing, treated w short burst pred. Still w some allergic sx, taking fluticasone. Also on omeprazole bid. In general her control is OK - using her SABA about 2x a day.    Acute ov 12/10/12 cc much worse since  1/4/7 was doing well on just symbicort but abuptly worse on 1/4 ? p Ran out of symbicort and prilosec only using q am   with poor hfa was dry cough/ wheeze and sob now with min activity.  Was not using any saba  Then since exac using up to every 4 hours.  No obvious daytime variabilty or assoc purulent sputum prodction or cp or chest tightness,  overt sinus or hb symptoms. No unusual exp hx    At baseline Sleeping ok without nocturnal  or early am exacerbation  of respiratory  c/o's or need for noct saba. Also denies any obvious fluctuation of symptoms with weather or environmental changes or other aggravating or alleviating factors except as outlined above  ROS  The following are not active complaints unless bolded sore throat, dysphagia, dental problems, itching, sneezing,  nasal congestion or excess/ purulent secretions, ear ache,   fever, chills, sweats, unintended wt loss, pleuritic or exertional cp, hemoptysis,  orthopnea pnd or leg swelling, presyncope, palpitations, heartburn, abdominal pain, anorexia, nausea, vomiting, diarrhea  or change in bowel or urinary habits, change in stools or urine, dysuria,hematuria,  rash, arthralgias, visual complaints, headache, numbness weakness or ataxia or problems with walking or coordination,  change in mood/affect or memory.      Objective:   Physical Exam  12/10/2012 196  Filed Vitals:   06/19/12 1329  BP: 132/72  Pulse: 84  Temp: 98.4 F (36.9 C)   Gen: Pleasant, overwt, in no distress,  normal affect,  Mod hoarse/ upper airway cough patern  ENT: No lesions,  mouth clear,  oropharynx clear, no postnasal drip  Neck: supple no JVD,  insp and exp wheeze and pseudowheeze  Lungs clear with referred UA noise,   Cardiovascular: RRR, heart sounds normal, no murmur or gallops, no peripheral edema  Musculoskeletal: No deformities, no cyanosis or clubbing  Neuro: alert, non focal  Skin: Warm, no lesions or rashes      Assessment & Plan:

## 2012-12-10 NOTE — Patient Instructions (Addendum)
Prednisone 10 mg take  4 each am x 2 days,   2 each am x 2 days,  1 each am x2days and stop   Symbicort 160 Take 2 puffs first thing in am and then another 2 puffs about 12 hours later and keep up with refills by looking at the indicator on back  Prilosec is Take 30- 60 min before your first and last meals of the day   GERD (REFLUX)  is an extremely common cause of respiratory symptoms, many times with no significant heartburn at all.    It can be treated with medication, but also with lifestyle changes including avoidance of late meals, excessive alcohol, smoking cessation, and avoid fatty foods, chocolate, peppermint, colas, red wine, and acidic juices such as orange juice.  NO MINT OR MENTHOL PRODUCTS SO NO COUGH DROPS  USE SUGARLESS CANDY INSTEAD (jolley ranchers or Stover's)  NO OIL BASED VITAMINS - use powdered substitutes.  Work on inhaler technique:  relax and gently blow all the way out then take a nice smooth deep breath back in, triggering the inhaler at same time you start breathing in.  Hold for up to 5 seconds if you can.  Rinse and gargle with water when done   If your mouth or throat starts to bother you,   I suggest you time the inhaler to your dental care and after using the inhaler(s) brush teeth and tongue with a baking soda containing toothpaste and when you rinse this out, gargle with it first to see if this helps your mouth and throat.       Please schedule a follow up office visit in 6 weeks, call sooner if needed to see [place]

## 2012-12-11 NOTE — Assessment & Plan Note (Signed)
Acute exac p ran out of symbicort and prilosec so difficult to be sure whether the pseudoasthma or the asthma is the main problem typical of  Classic Upper airway cough syndrome, so named because it's frequently impossible to sort out how much is  CR/sinusitis with freq throat clearing (which can be related to primary GERD)   vs  causing  secondary (" extra esophageal")  GERD from wide swings in gastric pressure that occur with throat clearing, often  promoting self use of mint and menthol lozenges that reduce the lower esophageal sphincter tone and exacerbate the problem further in a cyclical fashion.   These are the same pts (now being labeled as having "irritable larynx syndrome" by some cough centers) who not infrequently have a history of having failed to tolerate ace inhibitors,  dry powder inhalers or biphosphonates or report having atypical reflux symptoms that don't respond to standard doses of PPI , and are easily confused as having aecopd or asthma flares by even experienced allergists/ pulmonologists.   The proper method of use, as well as anticipated side effects, of a metered-dose inhaler are discussed and demonstrated to the patient. Improved effectiveness after extensive coaching during this visit to a level of approximately  75% so continue symbicort and rx with very short course of prednisone.    Each maintenance medication was reviewed in detail including most importantly the difference between maintenance and as needed and under what circumstances the prns are to be used.  Please see instructions for details which were reviewed in writing and the patient given a copy.

## 2012-12-11 NOTE — Assessment & Plan Note (Signed)
Reviewed importance of aggressive rx with ppi bid and diet.

## 2012-12-18 ENCOUNTER — Observation Stay (HOSPITAL_COMMUNITY)
Admission: EM | Admit: 2012-12-18 | Discharge: 2012-12-18 | Disposition: A | Discharge status: Home / Self Care | Payer: Medicare Other | Attending: Emergency Medicine | Admitting: Emergency Medicine

## 2012-12-18 ENCOUNTER — Encounter (HOSPITAL_COMMUNITY): Payer: Self-pay

## 2012-12-18 ENCOUNTER — Observation Stay (HOSPITAL_COMMUNITY): Payer: Medicare Other

## 2012-12-18 ENCOUNTER — Telehealth: Payer: Self-pay | Admitting: Emergency Medicine

## 2012-12-18 DIAGNOSIS — K219 Gastro-esophageal reflux disease without esophagitis: Secondary | ICD-10-CM | POA: Insufficient documentation

## 2012-12-18 DIAGNOSIS — Z87891 Personal history of nicotine dependence: Secondary | ICD-10-CM | POA: Insufficient documentation

## 2012-12-18 DIAGNOSIS — Z860100 Personal history of colon polyps, unspecified: Secondary | ICD-10-CM | POA: Insufficient documentation

## 2012-12-18 DIAGNOSIS — IMO0002 Reserved for concepts with insufficient information to code with codable children: Secondary | ICD-10-CM | POA: Insufficient documentation

## 2012-12-18 DIAGNOSIS — R05 Cough: Secondary | ICD-10-CM | POA: Insufficient documentation

## 2012-12-18 DIAGNOSIS — Z8719 Personal history of other diseases of the digestive system: Secondary | ICD-10-CM | POA: Insufficient documentation

## 2012-12-18 DIAGNOSIS — Z79899 Other long term (current) drug therapy: Secondary | ICD-10-CM | POA: Insufficient documentation

## 2012-12-18 DIAGNOSIS — E785 Hyperlipidemia, unspecified: Secondary | ICD-10-CM | POA: Insufficient documentation

## 2012-12-18 DIAGNOSIS — J45901 Unspecified asthma with (acute) exacerbation: Principal | ICD-10-CM | POA: Insufficient documentation

## 2012-12-18 DIAGNOSIS — E119 Type 2 diabetes mellitus without complications: Secondary | ICD-10-CM | POA: Insufficient documentation

## 2012-12-18 DIAGNOSIS — Z8739 Personal history of other diseases of the musculoskeletal system and connective tissue: Secondary | ICD-10-CM | POA: Insufficient documentation

## 2012-12-18 DIAGNOSIS — Z7982 Long term (current) use of aspirin: Secondary | ICD-10-CM | POA: Insufficient documentation

## 2012-12-18 DIAGNOSIS — R059 Cough, unspecified: Secondary | ICD-10-CM | POA: Insufficient documentation

## 2012-12-18 LAB — CBC WITH DIFFERENTIAL/PLATELET
Basophils Relative: 0 % (ref 0–1)
Eosinophils Relative: 1 % (ref 0–5)
HCT: 38.2 % (ref 36.0–46.0)
Hemoglobin: 12.8 g/dL (ref 12.0–15.0)
Lymphs Abs: 1.8 10*3/uL (ref 0.7–4.0)
MCV: 66.1 fL — ABNORMAL LOW (ref 78.0–100.0)
Monocytes Relative: 7 % (ref 3–12)
Platelets: 534 10*3/uL — ABNORMAL HIGH (ref 150–400)
RBC: 5.78 MIL/uL — ABNORMAL HIGH (ref 3.87–5.11)
WBC: 11.5 10*3/uL — ABNORMAL HIGH (ref 4.0–10.5)

## 2012-12-18 LAB — BASIC METABOLIC PANEL
CO2: 26 mEq/L (ref 19–32)
Chloride: 102 mEq/L (ref 96–112)
Creatinine, Ser: 0.79 mg/dL (ref 0.50–1.10)
Glucose, Bld: 92 mg/dL (ref 70–99)

## 2012-12-18 MED ORDER — ALBUTEROL SULFATE (5 MG/ML) 0.5% IN NEBU
5.0000 mg | INHALATION_SOLUTION | Freq: Once | RESPIRATORY_TRACT | Status: AC
  Administered 2012-12-18: 5 mg via RESPIRATORY_TRACT

## 2012-12-18 MED ORDER — ALBUTEROL (5 MG/ML) CONTINUOUS INHALATION SOLN
10.0000 mg/h | INHALATION_SOLUTION | RESPIRATORY_TRACT | Status: DC
  Administered 2012-12-18: 10 mg/h via RESPIRATORY_TRACT
  Filled 2012-12-18: qty 20

## 2012-12-18 MED ORDER — SODIUM CHLORIDE 0.9 % IV SOLN
INTRAVENOUS | Status: DC
  Administered 2012-12-18: 18:00:00 via INTRAVENOUS

## 2012-12-18 MED ORDER — PREDNISONE 20 MG PO TABS: 60.0000 mg | ORAL_TABLET | Freq: Every day | ORAL | Status: DC

## 2012-12-18 MED ORDER — IPRATROPIUM BROMIDE 0.02 % IN SOLN
0.5000 mg | Freq: Once | RESPIRATORY_TRACT | Status: AC
  Administered 2012-12-18: 0.5 mg via RESPIRATORY_TRACT

## 2012-12-18 MED ORDER — IPRATROPIUM BROMIDE 0.02 % IN SOLN
RESPIRATORY_TRACT | Status: AC
  Administered 2012-12-18: 0.5 mg via RESPIRATORY_TRACT
  Filled 2012-12-18: qty 2.5

## 2012-12-18 MED ORDER — METHYLPREDNISOLONE SODIUM SUCC 125 MG IJ SOLR
125.0000 mg | Freq: Once | INTRAMUSCULAR | Status: AC
  Administered 2012-12-18: 125 mg via INTRAVENOUS
  Filled 2012-12-18: qty 2

## 2012-12-18 MED ORDER — ALBUTEROL SULFATE (5 MG/ML) 0.5% IN NEBU
INHALATION_SOLUTION | RESPIRATORY_TRACT | Status: AC
  Administered 2012-12-18: 5 mg via RESPIRATORY_TRACT
  Filled 2012-12-18: qty 1

## 2012-12-18 MED ORDER — MAGNESIUM SULFATE 40 MG/ML IJ SOLN
2.0000 g | Freq: Once | INTRAMUSCULAR | Status: AC
  Administered 2012-12-18: 2 g via INTRAVENOUS
  Filled 2012-12-18: qty 50

## 2012-12-18 NOTE — Telephone Encounter (Signed)
I held a 4:15 and 4:30 slot on Thursday for the pt, per RB she needs to come in. I [id number] to offer appt for the pt. Carron Curie, CMA

## 2012-12-18 NOTE — ED Notes (Signed)
Pt continues to wheeze after completion of first breathing treatment; however, appears in less distress than at time of presentation to ED; resp slightly labored at 24/min; placed on 2L o2/[place]; NP in to assess

## 2012-12-18 NOTE — ED Notes (Signed)
RT at bedside.

## 2012-12-18 NOTE — ED Provider Notes (Signed)
History   This chart was scribed for non-physician practitioner working with No att. providers found by Sofie Rower, ED Scribe. This patient was seen in room TR04C/TR04C and the patient's care was started at 5:25PM.     CSN: [id number]  Arrival date & time 12/18/12  1703   None     Chief Complaint  Patient presents with  . Shortness of Breath    sob x1 week becoming progressively worse; seen at onset of symptoms - placed on prednisone taper - pt reports no better even with use of at home inhalers    (Consider location/radiation/quality/duration/timing/severity/associated sxs/prior treatment) Patient is a 67 y.o. female presenting with shortness of breath. The history is provided by the patient. No language interpreter was used.  Shortness of Breath  The current episode started 5 to 7 days ago. The onset was sudden. The problem occurs rarely. The problem has been gradually worsening. The problem is moderate. Nothing relieves the symptoms. Nothing aggravates the symptoms. Associated symptoms include shortness of breath and wheezing. Pertinent negatives include no fever.    [name] is a 67 y.o. female , with a hx of asthma, who presents to the Emergency Department complaining of sudden, progressively worsening, shortness of breath, onset one week ago.  Associated symptoms include non productive cough and wheezing. The pt has taken breathing treatments and applied albuterol inhalers at home which do not provide relief of the shortness of breath.  The pt denies fever and chills.  The pt does not smoke or drink alcohol.   PCP is [facility].    Past Medical History  Diagnosis Date  . Allergic rhinitis   . Asthma   . GERD (gastroesophageal reflux disease)   . Hyperlipidemia   . Vocal cord dysfunction   . Esophageal stricture   . Colonic polyp   . Osteoporosis   . DJD (degenerative joint disease) of knee     bilateral  . Hiatal hernia   . DDD (degenerative disc disease), lumbar  07/20/2011  . DJD (degenerative joint disease), lumbar 07/20/2011  . Diabetes mellitus, type 2     does not take medication; pt denies    Past Surgical History  Procedure Date  . Carpal tunnel release     bilateral  . Total abdominal hysterectomy   . Foot surgery     bilateral    Family History  Problem Relation Age of Onset  . Cancer Sister   . Heart attack Mother   . Asthma Brother   . Asthma      uncle  . Colon cancer Father 84  . Stomach cancer Neg Hx   . Esophageal cancer Neg Hx   . Rectal cancer Neg Hx     History  Substance Use Topics  . Smoking status: Former Smoker -- 1.0 packs/day for 35 years    Types: Cigarettes    Quit date: 08/08/1981  . Smokeless tobacco: Never Used  . Alcohol Use: No    OB History    Grav Para Term Preterm Abortions TAB SAB Ect Mult Living                  Review of Systems  Constitutional: Negative for fever.  Respiratory: Positive for shortness of breath and wheezing.   All other systems reviewed and are negative.    Allergies  Penicillins  Home Medications   Current Outpatient Rx  Name  Route  Sig  Dispense  Refill  . ALBUTEROL SULFATE HFA 108 (90  BASE) MCG/ACT IN AERS   Inhalation   Inhale 2 puffs into the lungs every 4 (four) hours as needed. For shortness of breath/wheezing.         . ALBUTEROL SULFATE (2.5 MG/3ML) 0.083% IN NEBU   Nebulization   Take 2.5 mg by nebulization every 4 (four) hours as needed. For shortness of breath.         . ASPIRIN EC 81 MG PO TBEC   Oral   Take 81 mg by mouth daily.         Marland Kitchen BLACK COHOSH PO   Oral   Take 1 capsule by mouth daily.          . BUDESONIDE-FORMOTEROL FUMARATE 160-4.5 MCG/ACT IN AERO   Inhalation   Inhale 2 puffs into the lungs 2 (two) times daily.   1 Inhaler   0   . FEXOFENADINE HCL 180 MG PO TABS   Oral   Take 180 mg by mouth daily.         Marland Kitchen FLUTICASONE PROPIONATE 50 MCG/ACT NA SUSP   Nasal   Place 1 spray into the nose 2 (two) times  daily.          Marland Kitchen OMEPRAZOLE 20 MG PO CPDR   Oral   Take 20 mg by mouth 2 (two) times daily.         Marland Kitchen PRAVASTATIN SODIUM 40 MG PO TABS      TAKE ONE TABLET BY MOUTH EVERY DAY   90 tablet   2   . PREDNISONE (PAK) 10 MG PO TABS      Prednisone 10 mg take  4 each am x 2 days,   2 each am x 2 days,  1 each am x2days and stop   14 tablet   0     BP 148/75  Pulse 95  Resp 32  SpO2 100%  Physical Exam  Nursing note and vitals reviewed. Constitutional: She is oriented to person, place, and time. She appears well-developed and well-nourished. She appears distressed.  HENT:  Head: Atraumatic.  Nose: Nose normal.  Neck: Neck supple.  Cardiovascular: Normal rate, regular rhythm and normal heart sounds.   Pulmonary/Chest: She is in respiratory distress. She has wheezes.       Wheezing detected in all lung fields.   Abdominal: Soft. Bowel sounds are normal.  Musculoskeletal: Normal range of motion.  Lymphadenopathy:    She has no cervical adenopathy.  Neurological: She is alert and oriented to person, place, and time.  Skin: Skin is warm and dry.  Psychiatric: She has a normal mood and affect. Her behavior is normal.    ED Course  Procedures (including critical care time)  DIAGNOSTIC STUDIES: Oxygen Saturation is 100% on , normal by my interpretation.    COORDINATION OF CARE:   5:30 PM- Treatment plan discussed with patient. Pt agrees with treatment.  7:15 PM- Recheck. Treatment plan discussed with patient. Pt agrees with treatment.  8:52 PM- Recheck. Treatment plan discussed with patient. Pt agrees with treatment.        Results for orders placed during the hospital encounter of 12/18/12  CBC WITH DIFFERENTIAL      Component Value Range   WBC 11.5 (*) 4.0 - 10.5 K/uL   RBC 5.78 (*) 3.87 - 5.11 MIL/uL   Hemoglobin 12.8  12.0 - 15.0 g/dL   HCT 16.1  09.6 - 04.5 %   MCV 66.1 (*) 78.0 - 100.0 fL   MCH 22.1 (*)  26.0 - 34.0 pg   MCHC 33.5  30.0 - 36.0 g/dL    RDW 16.1 (*) 09.6 - 15.5 %   Platelets 534 (*) 150 - 400 K/uL   Neutrophils Relative 76  43 - 77 %   Lymphocytes Relative 16  12 - 46 %   Monocytes Relative 7  3 - 12 %   Eosinophils Relative 1  0 - 5 %   Basophils Relative 0  0 - 1 %   Neutro Abs 8.8 (*) 1.7 - 7.7 K/uL   Lymphs Abs 1.8  0.7 - 4.0 K/uL   Monocytes Absolute 0.8  0.1 - 1.0 K/uL   Eosinophils Absolute 0.1  0.0 - 0.7 K/uL   Basophils Absolute 0.0  0.0 - 0.1 K/uL   Smear Review MORPHOLOGY UNREMARKABLE    BASIC METABOLIC PANEL      Component Value Range   Sodium 139  135 - 145 mEq/L   Potassium 3.5  3.5 - 5.1 mEq/L   Chloride 102  96 - 112 mEq/L   CO2 26  19 - 32 mEq/L   Glucose, Bld 92  70 - 99 mg/dL   BUN 10  6 - 23 mg/dL   Creatinine, Ser 0.45  0.50 - 1.10 mg/dL   Calcium 9.4  8.4 - 40.9 mg/dL   GFR calc non Af Amer 84 (*) >90 mL/min   GFR calc Af Amer >90  >90 mL/min   Dg Chest 2 View  12/18/2012  *RADIOLOGY REPORT*  Clinical Data: Wheezing  CHEST - 2 VIEW  Comparison: 09/01/2012  Findings: Decreased lung volume compared to the prior study.  Mild atelectasis in the right lung base.  No definite pneumonia.  No heart failure or effusion.  IMPRESSION: Mild hypoventilation and right lower lobe atelectasis.   Original Report Authenticated By: Janeece Riggers, M.D.       1. Asthma exacerbation       MDM  Acute exacerbation of asthma. Chest x-ray or be obtained to rule out pneumonia and she will be given prednisone and albuterol with Atrovent.  She feels much better after above noted treatment. She is discharged with prescription for prednisone.   I personally performed the services described in this documentation, which was scribed in my presence. The recorded information has been reviewed and is accurate.      Dione Booze, MD 01/01/13 [phone]

## 2012-12-18 NOTE — ED Provider Notes (Signed)
67 year old female with history of asthma has been having difficulty breathing and wheezing for the last week. She's had a nonproductive cough. No fever or chills. She had seen her pulmonologist last week and was put on a course of prednisone tapering from 40 mg down to 0 but still no improvement whatsoever with that with her home nebulizer and inhaler. On exam, she is not in any distress but does have moderate wheezing. She is currently getting a high dose albuterol nebulizer treatment. She will probably need to be on a higher dose of prednisone  Medical screening examination/treatment/procedure(s) were conducted as a shared visit with non-physician practitioner(s) and myself.  I personally evaluated the patient during the encounter   Dione Booze, MD 12/18/12 1859

## 2012-12-18 NOTE — Telephone Encounter (Signed)
Pt c/o increasing sob, wheezing, chest tightness and cough for several days. She has used albuterol nebs twice today and the albuterol inhaler twice with little relief.  She says the Prednisone taper that Mw gave her a week ago really did not help her symptoms. RB, pls advise. Allergies  Allergen Reactions  . Penicillins Itching and Rash

## 2012-12-18 NOTE — Telephone Encounter (Signed)
She has hx severe UA disease and VCD. Probably needs an OV since the pred didn;t help. Needs an OV

## 2012-12-19 ENCOUNTER — Telehealth: Payer: Self-pay | Admitting: Internal Medicine

## 2012-12-19 NOTE — Telephone Encounter (Signed)
I called the patient per Dr. Jonny Ruiz to offer an appt to  Follow up.  Pt declined an appt.  She states she has a new doctorl

## 2012-12-19 NOTE — Telephone Encounter (Signed)
Appt set for 4:30. Carron Curie, CMA

## 2012-12-19 NOTE — Telephone Encounter (Signed)
I called the patient to ask who her PCP is.  PCP has been changed in the system.

## 2012-12-19 NOTE — Telephone Encounter (Signed)
Please call pt - and change the PCP on EPIC if possible so this does not happen again

## 2012-12-20 ENCOUNTER — Ambulatory Visit (INDEPENDENT_AMBULATORY_CARE_PROVIDER_SITE_OTHER): Payer: Medicare Other | Admitting: Emergency Medicine

## 2012-12-20 ENCOUNTER — Encounter: Payer: Self-pay | Admitting: Emergency Medicine

## 2012-12-20 VITALS — BP 122/84 | HR 96 | Temp 98.3°F | Ht 62.0 in | Wt 194.2 lb

## 2012-12-20 DIAGNOSIS — J383 Other diseases of vocal cords: Secondary | ICD-10-CM

## 2012-12-20 MED ORDER — HYDROCODONE-HOMATROPINE 5-1.5 MG/5ML PO SYRP: 5.0000 mL | ORAL_SOLUTION | Freq: Four times a day (QID) | ORAL | Status: DC | PRN

## 2012-12-20 NOTE — Patient Instructions (Addendum)
Please taper your prednisone as follows: take 40mg  (2 pills) for 3 days, then take 20mg  (1 pill) for 3 days, then take 1/2 a pill daily until they are gone.  You need to practice complete voice rest for the entire weekend.  Use hycodan 1 tbsp every 6 hours if needed for cough STOP Symbicort for a week, then restart it. Call our office if you have problems off the Symbicort Use albuterol as needed for shortness of breath Follow with Dr Delton Coombes in 1 month

## 2012-12-20 NOTE — Progress Notes (Signed)
Subjective:    Patient ID: [name], female    DOB: [birth year]   MRN: [id number] HPI 67 -year-old woman with a history of asthma and vocal cord dysfunction both of which have been significantly impacted by severe esophageal reflux. Has a hx esophageal stricture dilations by Dr Marina Goodell. On omeprazole 20mg  two times a day. Uses Symbicort bid.   11/18/10 -- VCD, GERD, allergies, probable coexisting asthma. Returns for f/u. Last time restarted loratadine, fluticasone. She was already on omeprazole. Her breathing is better. Today she mentions some dizziness, no syncope, can happen with sitting or standing. The room spins. No CP, no dyspnea, no palpitations. Remains off clonidine, stopped in October to see if it would help breathing. BP is OK (she was on it for sweats).   ROV 05/11/11 -- Hx of VCD and asthma that have been exacerbated by GERD and allergic rhinitis. She returns reporting more DOE over about the last 5 -6 months. Her wt has been stable at 206. She is not having any throat symptoms, has a strong voice. Rare SABA use, but she did need it more frequently in March. She is taking her fexofenadine, fluticasone bid, omeprazole. Symbicort bid.   ROV 06/06/2011  Presents for an acute office visit. Complains of increased SOB, wheezing, prod cough with yellow mucus x 3days. Wheezing is getting worse today. She is under a lot of stress, tired a lot . Husband had stroke 3 years ago, requires total care- at home by her.  OTC not helping. Has some drainage.   ROV 11/10/11 -- Hx of VCD and asthma that have been exacerbated by GERD and allergic rhinitis. Returns for f/u. Since last visit she had a normal nuclear cardiac stress test. She tells me that she had an episode last week when she needed her albuterol more - ? After exposure to perfume.  Still uses Symbicort bid, albuterol prn. Her voice has been strong, taking her allergy regimen and PPI. Needs Flu shot.   Acute OV 04/16/12 Complains increased SOB,  wheezing, tightness in chest, dry cough, chest congestion x3 days.  Increased wheezing and dyspnea . Using her albuterol neb more over last 3 days.  No fever or discolored. No recent travel or abx use.  Has been doing well until last few days. No ER or Hospital visit. Rare use of SABA until this illness.  Under some stress with sick husband  ROV 06/19/12 -- Hx of VCD and asthma that have been exacerbated by GERD and allergic rhinitis. Formerly on chronic steroids but we successfully weaned to off. She tells me that she was admitted to [facility] for HA and CP, L arm numbness - ECG was reassuring. During that admit she had more trouble w her breathing, treated w short burst pred. Still w some allergic sx, taking fluticasone. Also on omeprazole bid. In general her control is OK - using her SABA about 2x a day.   Acute ov 12/10/12 cc much worse since 1/4/7 was doing well on just symbicort but abuptly worse on 1/4 ? p Ran out of symbicort and prilosec only using q am   with poor hfa was dry cough/ wheeze and sob now with min activity.  Was not using any saba  Then since [facility] using up to every 4 hours.  ROV 12/20/12 -- Hx of VCD and asthma that have been exacerbated by GERD and allergic rhinitis. She saw Dr Sherene Sires as above, treated for AE of her VCD with prednisone. She states that she  never ran out of symbicort or omeprazole. Has also been seen in ED 2 days ago and rx solumedrol and more pred. No other associated sx or factors. She is having UA wheeze, cough (non-productive). She has stayed on the allegra and nasal steroid. Nothing has made this any better - not prednisone, BD's etc.    Objective:   Physical Exam Filed Vitals:   12/20/12 1649  BP: 122/84  Pulse: 96  Temp: 98.3 F (36.8 C)    Gen: Pleasant, overwt, in no distress,  normal affect,   ENT: No lesions,  mouth clear,  oropharynx clear, no postnasal drip  Neck: supple no JVD,  insp and exp stridor  Lungs clear with referred UA noise, no true  wheezing  Cardiovascular: RRR, heart sounds normal, no murmur or gallops, no peripheral edema  Musculoskeletal: No deformities, no cyanosis or clubbing  Neuro: alert, non focal  Skin: Warm, no lesions or rashes      Assessment & Plan:     VOCAL CORD DISORDER Her VCD is exacerbated. She's been treated for asthma with no results. This has happened before, has been difficult to manage. Will try to address anything that is keeping her UA irritated - taper the pred - continue the omeprazole, allergy regimen - stop symbicort temporarily - albuterol prn - full voice rest for next several days - hycodan cough syrup - rov 1 month

## 2012-12-20 NOTE — Assessment & Plan Note (Signed)
Her VCD is exacerbated. She's been treated for asthma with no results. This has happened before, has been difficult to manage. Will try to address anything that is keeping her UA irritated - taper the pred - continue the omeprazole, allergy regimen - stop symbicort temporarily - albuterol prn - full voice rest for next several days - hycodan cough syrup - rov 1 month

## 2012-12-27 ENCOUNTER — Other Ambulatory Visit: Payer: Self-pay | Admitting: Emergency Medicine

## 2013-01-28 ENCOUNTER — Encounter: Payer: Self-pay | Admitting: Emergency Medicine

## 2013-01-28 ENCOUNTER — Ambulatory Visit (INDEPENDENT_AMBULATORY_CARE_PROVIDER_SITE_OTHER): Payer: Medicare Other | Admitting: Emergency Medicine

## 2013-01-28 VITALS — BP 110/80 | HR 89 | Temp 98.1°F | Ht 62.0 in | Wt 191.8 lb

## 2013-01-28 NOTE — Assessment & Plan Note (Signed)
-   restart symbicort, follow for any impact this has on her breathing and/or her UA sx

## 2013-01-28 NOTE — Progress Notes (Signed)
Subjective:    Patient ID: [name], female    DOB: 08-29-1946   MRN: [id number] HPI 67 -year-old woman with a history of asthma and vocal cord dysfunction both of which have been significantly impacted by severe esophageal reflux. Has a hx esophageal stricture dilations by Dr Marina Goodell. On omeprazole 20mg  two times a day. Uses Symbicort bid.   11/18/10 -- VCD, GERD, allergies, probable coexisting asthma. Returns for f/u. Last time restarted loratadine, fluticasone. She was already on omeprazole. Her breathing is better. Today she mentions some dizziness, no syncope, can happen with sitting or standing. The room spins. No CP, no dyspnea, no palpitations. Remains off clonidine, stopped in October to see if it would help breathing. BP is OK (she was on it for sweats).   ROV 05/11/11 -- Hx of VCD and asthma that have been exacerbated by GERD and allergic rhinitis. She returns reporting more DOE over about the last 5 -6 months. Her wt has been stable at 206. She is not having any throat symptoms, has a strong voice. Rare SABA use, but she did need it more frequently in March. She is taking her fexofenadine, fluticasone bid, omeprazole. Symbicort bid.   ROV 06/06/2011  Presents for an acute office visit. Complains of increased SOB, wheezing, prod cough with yellow mucus x 3days. Wheezing is getting worse today. She is under a lot of stress, tired a lot . Husband had stroke 3 years ago, requires total care- at home by her.  OTC not helping. Has some drainage.   ROV 11/10/11 -- Hx of VCD and asthma that have been exacerbated by GERD and allergic rhinitis. Returns for f/u. Since last visit she had a normal nuclear cardiac stress test. She tells me that she had an episode last week when she needed her albuterol more - ? After exposure to perfume.  Still uses Symbicort bid, albuterol prn. Her voice has been strong, taking her allergy regimen and PPI. Needs Flu shot.   Acute OV 04/16/12 Complains increased SOB,  wheezing, tightness in chest, dry cough, chest congestion x3 days.  Increased wheezing and dyspnea . Using her albuterol neb more over last 3 days.  No fever or discolored. No recent travel or abx use.  Has been doing well until last few days. No ER or Hospital visit. Rare use of SABA until this illness.  Under some stress with sick husband  ROV 06/19/12 -- Hx of VCD and asthma that have been exacerbated by GERD and allergic rhinitis. Formerly on chronic steroids but we successfully weaned to off. She tells me that she was admitted to [facility] for HA and CP, L arm numbness - ECG was reassuring. During that admit she had more trouble w her breathing, treated w short burst pred. Still w some allergic sx, taking fluticasone. Also on omeprazole bid. In general her control is OK - using her SABA about 2x a day.   Acute ov 12/10/12 cc much worse since 1/4/7 was doing well on just symbicort but abuptly worse on 1/4 ? p Ran out of symbicort and prilosec only using q am   with poor hfa was dry cough/ wheeze and sob now with min activity.  Was not using any saba  Then since [facility] using up to every 4 hours.  ROV 12/20/12 -- Hx of VCD and asthma that have been exacerbated by GERD and allergic rhinitis. She saw Dr Sherene Sires as above, treated for AE of her VCD with prednisone. She states that she  never ran out of symbicort or omeprazole. Has also been seen in ED 2 days ago and rx solumedrol and more pred. No other associated sx or factors. She is having UA wheeze, cough (non-productive). She has stayed on the allegra and nasal steroid. Nothing has made this any better - not prednisone, BD's etc.   ROV 01/28/13 -- Hx of VCD and asthma that have been exacerbated by GERD and allergic rhinitis. She has been flaring since early January. We have been trying to aggressively treat allergies and GERD, weaned prednisone and stopped symbicort temporarily. She returns with improvement in her cough and UA wheeze, but she does have residual  cough. Unfortunately she is having more nasal congestion, ? URI. She is on allegra, fluticasone, omeprazole, NSW.    Objective:   Physical Exam Filed Vitals:   01/28/13 1345  BP: 110/80  Pulse: 89  Temp: 98.1 F (36.7 C)    Gen: Pleasant, overwt, in no distress,  normal affect,   ENT: No lesions,  mouth clear,  oropharynx clear, no postnasal drip  Neck: supple no JVD,  insp and exp stridor  Lungs clear with referred UA noise, no true wheezing  Cardiovascular: RRR, heart sounds normal, no murmur or gallops, no peripheral edema  Musculoskeletal: No deformities, no cyanosis or clubbing  Neuro: alert, non focal  Skin: Warm, no lesions or rashes      Assessment & Plan:     VOCAL CORD DISORDER Improved today. She is having some nasal drainage and cough.  - continue GERD and allergy regimen - ok to restart symbicort - rov 3 mon  ASTHMA, MILD - restart symbicort, follow for any impact this has on her breathing and/or her UA sx

## 2013-01-28 NOTE — Patient Instructions (Addendum)
Please continue your nasal washes, allegra, fluticasone nose spray and omeprazole.  Restart your Symbicort twice a day. Remember to rinse your mouth Follow with Dr Delton Coombes in 3 months or sooner if you have any problems.

## 2013-01-28 NOTE — Assessment & Plan Note (Signed)
Improved today. She is having some nasal drainage and cough.  - continue GERD and allergy regimen - ok to restart symbicort - rov 3 mon

## 2013-02-05 ENCOUNTER — Emergency Department (HOSPITAL_COMMUNITY): Payer: Medicare Other

## 2013-02-05 ENCOUNTER — Emergency Department (HOSPITAL_COMMUNITY)
Admission: EM | Admit: 2013-02-05 | Discharge: 2013-02-05 | Disposition: A | Discharge status: Home / Self Care | Payer: Medicare Other | Attending: Emergency Medicine | Admitting: Emergency Medicine

## 2013-02-05 ENCOUNTER — Encounter (HOSPITAL_COMMUNITY): Payer: Self-pay | Admitting: Emergency Medicine

## 2013-02-05 DIAGNOSIS — Y93F2 Activity, caregiving, lifting: Secondary | ICD-10-CM | POA: Insufficient documentation

## 2013-02-05 DIAGNOSIS — E119 Type 2 diabetes mellitus without complications: Secondary | ICD-10-CM | POA: Insufficient documentation

## 2013-02-05 DIAGNOSIS — Y929 Unspecified place or not applicable: Secondary | ICD-10-CM | POA: Insufficient documentation

## 2013-02-05 DIAGNOSIS — J45909 Unspecified asthma, uncomplicated: Secondary | ICD-10-CM | POA: Insufficient documentation

## 2013-02-05 DIAGNOSIS — Z860100 Personal history of colon polyps, unspecified: Secondary | ICD-10-CM | POA: Insufficient documentation

## 2013-02-05 DIAGNOSIS — Z8719 Personal history of other diseases of the digestive system: Secondary | ICD-10-CM | POA: Insufficient documentation

## 2013-02-05 DIAGNOSIS — Z8739 Personal history of other diseases of the musculoskeletal system and connective tissue: Secondary | ICD-10-CM | POA: Insufficient documentation

## 2013-02-05 DIAGNOSIS — X500XXA Overexertion from strenuous movement or load, initial encounter: Secondary | ICD-10-CM | POA: Insufficient documentation

## 2013-02-05 DIAGNOSIS — Z8709 Personal history of other diseases of the respiratory system: Secondary | ICD-10-CM | POA: Insufficient documentation

## 2013-02-05 DIAGNOSIS — Z79899 Other long term (current) drug therapy: Secondary | ICD-10-CM | POA: Insufficient documentation

## 2013-02-05 DIAGNOSIS — E785 Hyperlipidemia, unspecified: Secondary | ICD-10-CM | POA: Insufficient documentation

## 2013-02-05 DIAGNOSIS — Z87891 Personal history of nicotine dependence: Secondary | ICD-10-CM | POA: Insufficient documentation

## 2013-02-05 DIAGNOSIS — Z7982 Long term (current) use of aspirin: Secondary | ICD-10-CM | POA: Insufficient documentation

## 2013-02-05 DIAGNOSIS — IMO0002 Reserved for concepts with insufficient information to code with codable children: Secondary | ICD-10-CM | POA: Insufficient documentation

## 2013-02-05 DIAGNOSIS — K219 Gastro-esophageal reflux disease without esophagitis: Secondary | ICD-10-CM | POA: Insufficient documentation

## 2013-02-05 MED ORDER — HYDROCODONE-ACETAMINOPHEN 5-325 MG PO TABS: ORAL_TABLET | ORAL | Status: DC

## 2013-02-05 MED ORDER — HYDROCODONE-ACETAMINOPHEN 5-325 MG PO TABS
1.0000 | ORAL_TABLET | Freq: Once | ORAL | Status: AC
  Administered 2013-02-05: 1 via ORAL
  Filled 2013-02-05: qty 1

## 2013-02-05 MED ORDER — METHOCARBAMOL 500 MG PO TABS: 1000.0000 mg | ORAL_TABLET | Freq: Three times a day (TID) | ORAL | Status: DC

## 2013-02-05 MED ORDER — METHOCARBAMOL 500 MG PO TABS: 1000.0000 mg | ORAL_TABLET | Freq: Four times a day (QID) | ORAL | Status: DC

## 2013-02-05 NOTE — ED Provider Notes (Signed)
History     CSN: [id number]  Arrival date & time 02/05/13  0815   First MD Initiated Contact with Patient 02/05/13 [phone]      Chief Complaint  Patient presents with  . Back Pain    (Consider location/radiation/quality/duration/timing/severity/associated sxs/prior treatment) HPI Comments: Patient with no history of back problems presents today with right lower back pain that started acutely 2 days ago when she was moving her husband who is disabled. Patient states that she twisted and felt a pop in her back. She denies radiation of pain into her leg. She has pain had pain since that time. She states that she has taken a muscle relaxer x 1 and has soaked in Epsom salts and has not helped. It is made worse with movement and walking. Nothing has made it better. She denies urinary incontinence/retention, fecal incontinence. No other red flag signs and symptoms of lower back pain other than age. Course is constant.  Patient is a 67 y.o. female presenting with back pain. The history is provided by the patient.  Back Pain Associated symptoms: no dysuria, no fever, no numbness, no pelvic pain and no weakness     Past Medical History  Diagnosis Date  . Allergic rhinitis   . Asthma   . GERD (gastroesophageal reflux disease)   . Hyperlipidemia   . Vocal cord dysfunction   . Esophageal stricture   . Colonic polyp   . Osteoporosis   . DJD (degenerative joint disease) of knee     bilateral  . Hiatal hernia   . DDD (degenerative disc disease), lumbar 07/20/2011  . DJD (degenerative joint disease), lumbar 07/20/2011  . Diabetes mellitus, type 2     does not take medication; pt denies    Past Surgical History  Procedure Laterality Date  . Carpal tunnel release      bilateral  . Total abdominal hysterectomy    . Foot surgery      bilateral    Family History  Problem Relation Age of Onset  . Cancer Sister   . Heart attack Mother   . Asthma Brother   . Asthma      uncle  . Colon  cancer Father 20  . Stomach cancer Neg Hx   . Esophageal cancer Neg Hx   . Rectal cancer Neg Hx     History  Substance Use Topics  . Smoking status: Former Smoker -- 1.00 packs/day for 35 years    Types: Cigarettes    Quit date: 08/08/1981  . Smokeless tobacco: Never Used  . Alcohol Use: No    OB History   Grav Para Term Preterm Abortions TAB SAB Ect Mult Living                  Review of Systems  Constitutional: Negative for fever and unexpected weight change.  Gastrointestinal: Negative for constipation.       Negative for fecal incontinence.   Genitourinary: Negative for dysuria, hematuria, flank pain, vaginal bleeding, vaginal discharge and pelvic pain.       Negative for urinary incontinence or retention.  Musculoskeletal: Positive for back pain.  Neurological: Negative for weakness and numbness.       Denies saddle paresthesias.    Allergies  Penicillins  Home Medications   Current Outpatient Rx  Name  Route  Sig  Dispense  Refill  . albuterol (PROVENTIL HFA) 108 (90 BASE) MCG/ACT inhaler   Inhalation   Inhale 2 puffs into the lungs every 4 (  four) hours as needed. For shortness of breath/wheezing.         Marland Kitchen albuterol (PROVENTIL) (2.5 MG/3ML) 0.083% nebulizer solution   Nebulization   Take 2.5 mg by nebulization every 4 (four) hours as needed for shortness of breath. For shortness of breath.         Marland Kitchen aspirin EC 81 MG tablet   Oral   Take 81 mg by mouth daily.         Marland Kitchen BLACK COHOSH PO   Oral   Take 1 capsule by mouth daily.          . budesonide-formoterol (SYMBICORT) 160-4.5 MCG/ACT inhaler   Inhalation   Inhale 2 puffs into the lungs 2 (two) times daily.   1 Inhaler   0   . fexofenadine (ALLEGRA) 180 MG tablet   Oral   Take 180 mg by mouth daily.         . fluticasone (FLONASE) 50 MCG/ACT nasal spray   Nasal   Place 1 spray into the nose 2 (two) times daily.          Marland Kitchen HYDROcodone-homatropine (HYCODAN) 5-1.5 MG/5ML syrup    Oral   Take 5 mLs by mouth every 6 (six) hours as needed for cough.   240 mL   0   . ibuprofen (ADVIL,MOTRIN) 200 MG tablet   Oral   Take 400 mg by mouth daily as needed. For pain         . omeprazole (PRILOSEC) 20 MG capsule      TAKE ONE CAPSULE BY MOUTH TWICE DAILY   60 capsule   2   . pravastatin (PRAVACHOL) 40 MG tablet   Oral   Take 40 mg by mouth daily.           BP 135/82  Pulse 88  Temp(Src) 98.4 F (36.9 C)  Resp 17  SpO2 96%  Physical Exam  Nursing note and vitals reviewed. Constitutional: She appears well-developed and well-nourished.  HENT:  Head: Normocephalic and atraumatic.  Eyes: Conjunctivae are normal.  Neck: Normal range of motion. Neck supple.  Pulmonary/Chest: Effort normal.  Abdominal: Soft. There is no tenderness. There is no CVA tenderness.  Musculoskeletal: She exhibits tenderness.       Cervical back: She exhibits no tenderness and no bony tenderness.       Thoracic back: She exhibits no tenderness and no bony tenderness.       Lumbar back: She exhibits decreased range of motion and tenderness. She exhibits no bony tenderness.       Back:  No step-off noted with palpation of spine.   Neurological: She is alert. She has normal strength and normal reflexes. No sensory deficit.  5/5 strength in entire lower extremities bilaterally. No sensation deficit.   Skin: Skin is warm and dry. No rash noted.  Psychiatric: She has a normal mood and affect.    ED Course  Procedures (including critical care time)  Labs Reviewed - No data to display Dg Lumbar Spine Complete  02/05/2013  *RADIOLOGY REPORT*  Clinical Data: Back pain post lifting injury  LUMBAR SPINE - COMPLETE 4+ VIEW  Comparison: 07/19/2011  Findings: Five views of the lumbar spine submitted.  Minimal levoscoliosis.  No acute fracture or subluxation.  Again noted about 4.5 mm of anterolisthesis L4 on L5 vertebral body.  Facet degenerative changes L4 and L5 level.  There is disc space  flattening at L5-S1 level.  Narrowing of the neural foramen noted at the  L5 S1 level.  IMPRESSION: No acute fracture or subluxation.  Again noted about 4.5 mm anterolisthesis L4 on L5 vertebral body.  Degenerative changes as described above.   Original Report Authenticated By: Natasha Mead, M.D.      1. Back pain     8:57 AM Patient seen and examined. Work-up initiated. Medications ordered.   Vital signs reviewed and are as follows: Filed Vitals:   02/05/13 0825  BP: 135/82  Pulse: 88  Temp: 98.4 F (36.9 C)  Resp: 17   9:54 AM X-ray findings reviewed. Pt informed.   Patient was counseled on back pain precautions and told to do activity as tolerated but do not lift, push, or pull heavy objects more than 10 pounds for the next week.  Patient counseled to use ice or heat on back for no longer than 15 minutes every hour.   Patient prescribed muscle relaxer and counseled on proper use of muscle relaxant medication.    Patient prescribed narcotic pain medicine and counseled on proper use of narcotic pain medications. Counseled not to combine this medication with others containing tylenol.   Patient counseled to use pain medication only under direct supervision at the lowest possible dose needed to control pain. Educated on concern for fall risk and depression of breathing. Counseled to use only under the supervision of others.   Urged patient not to drink alcohol, drive, or perform any other activities that requires focus while taking either of these medications.  Patient urged to follow-up with PCP if pain does not improve with treatment and rest or if pain becomes recurrent. Urged to return with worsening severe pain, loss of bowel or bladder control, trouble walking.   The patient verbalizes understanding and agrees with the plan.    MDM  Patient with back pain. X-ray neg for fracture. No neurological deficits. Patient is ambulatory. No warning symptoms of back pain including: loss of  bowel or bladder control, night sweats, waking from sleep with back pain, unexplained fevers or weight loss, h/o cancer, IVDU, recent trauma. No concern for cauda equina, epidural abscess, or other serious cause of back pain. Conservative measures such as rest, ice/heat and pain medicine indicated with PCP follow-up if no improvement with conservative management.          Renne Crigler, [place] 02/05/13 [phone]

## 2013-02-05 NOTE — ED Notes (Signed)
Patient transported to X-ray 

## 2013-02-05 NOTE — ED Provider Notes (Signed)
Medical screening examination/treatment/procedure(s) were conducted as a shared visit with non-physician practitioner(s) and myself.  I personally evaluated the patient during the encounter.  Performed bedside eval.  Pt had right, lower lumbar area.  She had no midline tenderness and was able to perform bilateral straight raise without difficulty.  Imaging performed demonstrated no acute fracture.  Pt discharged with symptomatic care and instructions to f/u with PMD.  Tobin Chad, MD 02/05/13 [phone]

## 2013-02-05 NOTE — ED Notes (Signed)
Pt. Stated, I was helping to move my husband who is paralysis. And I must have twisted and hurt my back.  I've not been able to sleep lay down or anything.

## 2013-02-05 NOTE — ED Notes (Signed)
Report to Berkley, RN 

## 2013-04-18 ENCOUNTER — Telehealth: Payer: Self-pay | Admitting: Emergency Medicine

## 2013-04-18 MED ORDER — BUDESONIDE-FORMOTEROL FUMARATE 160-4.5 MCG/ACT IN AERO: 2.0000 | INHALATION_SPRAY | Freq: Two times a day (BID) | RESPIRATORY_TRACT | Status: DC

## 2013-04-18 NOTE — Telephone Encounter (Signed)
Pt notified that sample was left at front desk for pick up.

## 2013-05-08 ENCOUNTER — Ambulatory Visit (INDEPENDENT_AMBULATORY_CARE_PROVIDER_SITE_OTHER): Payer: Medicare Other | Admitting: Emergency Medicine

## 2013-05-08 ENCOUNTER — Encounter: Payer: Self-pay | Admitting: Emergency Medicine

## 2013-05-08 VITALS — BP 142/90 | HR 81 | Temp 98.3°F | Ht 62.0 in | Wt 187.2 lb

## 2013-05-08 DIAGNOSIS — J45909 Unspecified asthma, uncomplicated: Secondary | ICD-10-CM

## 2013-05-08 DIAGNOSIS — J383 Other diseases of vocal cords: Secondary | ICD-10-CM

## 2013-05-08 NOTE — Progress Notes (Signed)
  Subjective:    Patient ID: [name], female    DOB: 26-Mar-1946   MRN: [id number] HPI 67 -year-old woman with a history of asthma and vocal cord dysfunction both of which have been significantly impacted by severe esophageal reflux. Has a hx esophageal stricture dilations by Dr Marina Goodell. On omeprazole 20mg  two times a day. Uses Symbicort bid.   ROV 06/19/12 -- Hx of VCD and asthma that have been exacerbated by GERD and allergic rhinitis. Formerly on chronic steroids but we successfully weaned to off. She tells me that she was admitted to [facility] for HA and CP, L arm numbness - ECG was reassuring. During that admit she had more trouble w her breathing, treated w short burst pred. Still w some allergic sx, taking fluticasone. Also on omeprazole bid. In general her control is OK - using her SABA about 2x a day.   Acute ov 12/10/12 cc much worse since 1/4/7 was doing well on just symbicort but abuptly worse on 1/4 ? p Ran out of symbicort and prilosec only using q am   with poor hfa was dry cough/ wheeze and sob now with min activity.  Was not using any saba  Then since [facility] using up to every 4 hours.  ROV 12/20/12 -- Hx of VCD and asthma that have been exacerbated by GERD and allergic rhinitis. She saw Dr Sherene Sires as above, treated for AE of her VCD with prednisone. She states that she never ran out of symbicort or omeprazole. Has also been seen in ED 2 days ago and rx solumedrol and more pred. No other associated sx or factors. She is having UA wheeze, cough (non-productive). She has stayed on the allegra and nasal steroid. Nothing has made this any better - not prednisone, BD's etc.   ROV 01/28/13 -- Hx of VCD and asthma that have been exacerbated by GERD and allergic rhinitis. She has been flaring since early January. We have been trying to aggressively treat allergies and GERD, weaned prednisone and stopped symbicort temporarily. She returns with improvement in her cough and UA wheeze, but she does have residual  cough. Unfortunately she is having more nasal congestion, ? URI. She is on allegra, fluticasone, omeprazole, NSW.   ROV 05/08/13 -- labile VCD and asthma, severe GERD and rhinitis. Returns for f/u. Last time we restarted symbicort (stopped temporarily due to UA irritation). She remains on fluticasone, allegra, NSW, omeprazole bid. Has required albuterol nebs more frequently. Having more exertional SOB, sudden wheezing with exertion.    Objective:   Physical Exam Filed Vitals:   05/08/13 1331  BP: 142/90  Pulse: 81  Temp: 98.3 F (36.8 C)  TempSrc: Oral  Height: 5\' 2"  (1.575 m)  Weight: 84.913 kg (187 lb 3.2 oz)  SpO2: 99%   Gen: Pleasant, overwt, in no distress,  normal affect,   ENT: No lesions,  mouth clear,  oropharynx clear, no postnasal drip  Neck: supple no JVD,  Mild exp stridor  Lungs clear with soft referred UA noise, no true wheezing  Cardiovascular: RRR, heart sounds normal, no murmur or gallops, no peripheral edema  Musculoskeletal: No deformities, no cyanosis or clubbing  Neuro: alert, non focal  Skin: Warm, no lesions or rashes      Assessment & Plan:     VOCAL CORD DISORDER Some mild stridor but appears to be fairly stable - continue same gerd and rhinitis regimen  ASTHMA, MILD - symbicort bid - albuterol prn

## 2013-05-08 NOTE — Patient Instructions (Addendum)
Please continue your omeprazole 20mg  twice a day Continue your fluticasone nasal spray, loratadine, and nasal washes.  Continue your Symbicort twice a day Follow with Dr Delton Coombes in 4 months or sooner if you have any problems.

## 2013-05-08 NOTE — Assessment & Plan Note (Signed)
-   symbicort bid - albuterol prn

## 2013-05-08 NOTE — Assessment & Plan Note (Signed)
Some mild stridor but appears to be fairly stable - continue same gerd and rhinitis regimen

## 2013-05-20 ENCOUNTER — Other Ambulatory Visit: Payer: Self-pay | Admitting: Emergency Medicine

## 2013-06-24 ENCOUNTER — Telehealth: Payer: Self-pay | Admitting: Emergency Medicine

## 2013-06-24 NOTE — Telephone Encounter (Signed)
No samples available --pt aware

## 2013-07-03 ENCOUNTER — Telehealth: Payer: Self-pay | Admitting: Emergency Medicine

## 2013-07-03 MED ORDER — BUDESONIDE-FORMOTEROL FUMARATE 160-4.5 MCG/ACT IN AERO: 2.0000 | INHALATION_SPRAY | Freq: Two times a day (BID) | RESPIRATORY_TRACT | Status: DC

## 2013-07-03 NOTE — Telephone Encounter (Signed)
Pt notified that sample of Symbicort was left at front desk.

## 2013-07-24 ENCOUNTER — Encounter: Payer: Self-pay | Admitting: Pulmonary Disease

## 2013-07-24 ENCOUNTER — Ambulatory Visit (INDEPENDENT_AMBULATORY_CARE_PROVIDER_SITE_OTHER): Payer: Medicare Other | Admitting: Pulmonary Disease

## 2013-07-24 VITALS — BP 140/80 | HR 72 | Temp 98.4°F | Ht 62.0 in | Wt 192.8 lb

## 2013-07-24 DIAGNOSIS — J45901 Unspecified asthma with (acute) exacerbation: Secondary | ICD-10-CM | POA: Insufficient documentation

## 2013-07-24 MED ORDER — PREDNISONE 10 MG PO TABS: ORAL_TABLET | ORAL | Status: DC

## 2013-07-24 NOTE — Progress Notes (Signed)
  Subjective:    Patient ID: [name], female    DOB: 04/21/1946, 67 y.o.   MRN: [id number]  HPI The patient comes in today for an acute sick visit.  She has asthma as well as vocal cord dysfunction, both of which are affected by reflux disease.  She gives a 3 to four-day history of worsening shortness of breath, chest tightness with wheezing, and significant cough with an inability to sleep at night.  She's been staying on her reflux medication, does not feel this is a problem for her currently.  She denies any purulent mucus.   She has not had any fevers, chills, or sweats.   Review of Systems  Constitutional: Positive for chills and diaphoresis. Negative for fever and unexpected weight change.  HENT: Negative for ear pain, nosebleeds, congestion, sore throat, rhinorrhea, sneezing, trouble swallowing, dental problem, postnasal drip and sinus pressure.   Eyes: Negative for redness and itching.  Respiratory: Positive for cough, chest tightness, shortness of breath and wheezing.   Cardiovascular: Negative for palpitations and leg swelling.  Gastrointestinal: Negative for nausea and vomiting.  Genitourinary: Negative for dysuria.  Musculoskeletal: Negative for joint swelling.  Skin: Negative for rash.  Neurological: Positive for dizziness, light-headedness and headaches.  Hematological: Does not bruise/bleed easily.  Psychiatric/Behavioral: Negative for dysphoric mood. The patient is not nervous/anxious.        Objective:   Physical Exam Overweight female in no acute distress Nose without purulence or discharge noted Oropharynx clear Neck without lymphadenopathy or thyromegaly Chest with diffuse expiratory wheezes, as well as prominent upper airway pseudo-wheezing.  There is definite improvement in her wheezing with pursed lip breathing, but she has persistent lower airway noise despite this. Cardiac exam with regular rate and rhythm Lower extremities without significant edema, no  cyanosis Alert and oriented, moves all 4 extremities.       Assessment & Plan:

## 2013-07-24 NOTE — Addendum Note (Signed)
Addended by: Darrell Jewel on: 07/24/2013 11:06 AM   Modules accepted: Orders

## 2013-07-24 NOTE — Assessment & Plan Note (Signed)
The patient has noticed worsening shortness of breath and wheezing over the last 3-4 days, and has been having uncontrollable coughing and inability to sleep for the last 2 nights.  She has a history of asthma and also vocal cord dysfunction, and it is difficult to sort this out currently.  She is unable to do spirometry because of her significant coughing, and she does have persistent wheezing on exam with pursed lip breathing.  At this point, I'm going to treat her for an acute asthma exacerbation with a course of prednisone.  She denies any reflux symptoms at this time, and I have asked her to continue with her medical treatment.

## 2013-07-24 NOTE — Patient Instructions (Addendum)
Will treat with prednisone starting at 40mg  a day, and tapering off over 8 days. Make sure you continue with your reflux meds. Would like you to see our nurse practioner next week to check on progress, but call if you are getting worse.

## 2013-07-31 ENCOUNTER — Ambulatory Visit (INDEPENDENT_AMBULATORY_CARE_PROVIDER_SITE_OTHER): Payer: Medicare Other | Admitting: Adult Health

## 2013-07-31 ENCOUNTER — Ambulatory Visit (INDEPENDENT_AMBULATORY_CARE_PROVIDER_SITE_OTHER)
Admission: RE | Admit: 2013-07-31 | Discharge: 2013-07-31 | Disposition: A | Discharge status: Home / Self Care | Payer: Medicare Other | Source: Ambulatory Visit | Attending: Adult Health | Admitting: Adult Health

## 2013-07-31 ENCOUNTER — Encounter: Payer: Self-pay | Admitting: Adult Health

## 2013-07-31 VITALS — BP 126/76 | HR 83 | Temp 97.6°F | Ht 62.0 in | Wt 193.8 lb

## 2013-07-31 DIAGNOSIS — J45901 Unspecified asthma with (acute) exacerbation: Secondary | ICD-10-CM

## 2013-07-31 MED ORDER — HYDROCODONE-HOMATROPINE 5-1.5 MG/5ML PO SYRP: 5.0000 mL | ORAL_SOLUTION | Freq: Four times a day (QID) | ORAL | Status: DC | PRN

## 2013-07-31 NOTE — Assessment & Plan Note (Signed)
Asthma flare +/- VCD  Check cxr   Plan   Mucinex DM Twice daily  As needed  Cough  Hydromet 1-2 tsp every 4-6 hr As needed  Cough/congestion  Fluids and rest  Zyrtec 10mg  At bedtime  As needed  Drainage  follow up Dr. Delton Coombes  In 6 weeks and As needed   Please contact office for sooner follow up if symptoms do not improve or worsen or seek emergency care

## 2013-07-31 NOTE — Patient Instructions (Addendum)
Mucinex DM Twice daily  As needed  Cough  Hydromet 1-2 tsp every 4-6 hr As needed  Cough/congestion  Fluids and rest  Zyrtec 10mg  At bedtime  As needed  Drainage  follow up Dr. Delton Coombes  In 6 weeks and As needed   Please contact office for sooner follow up if symptoms do not improve or worsen or seek emergency care

## 2013-07-31 NOTE — Progress Notes (Signed)
Subjective:    Patient ID: [name], female    DOB: [birth year]   MRN: [id number] HPI 67 -year-old woman with a history of asthma and vocal cord dysfunction both of which have been significantly impacted by severe esophageal reflux. Has a hx esophageal stricture dilations by Dr Marina Goodell. On omeprazole 20mg  two times a day. Uses Symbicort bid.   ROV 06/19/12 -- Hx of VCD and asthma that have been exacerbated by GERD and allergic rhinitis. Formerly on chronic steroids but we successfully weaned to off. She tells me that she was admitted to [facility] for HA and CP, L arm numbness - ECG was reassuring. During that admit she had more trouble w her breathing, treated w short burst pred. Still w some allergic sx, taking fluticasone. Also on omeprazole bid. In general her control is OK - using her SABA about 2x a day.   Acute ov 12/10/12 cc much worse since 1/4/7 was doing well on just symbicort but abuptly worse on 1/4 ? p Ran out of symbicort and prilosec only using q am   with poor hfa was dry cough/ wheeze and sob now with min activity.  Was not using any saba  Then since exac using up to every 4 hours.  ROV 12/20/12 -- Hx of VCD and asthma that have been exacerbated by GERD and allergic rhinitis. She saw Dr Sherene Sires as above, treated for AE of her VCD with prednisone. She states that she never ran out of symbicort or omeprazole. Has also been seen in ED 2 days ago and rx solumedrol and more pred. No other associated sx or factors. She is having UA wheeze, cough (non-productive). She has stayed on the allegra and nasal steroid. Nothing has made this any better - not prednisone, BD's etc.   ROV 01/28/13 -- Hx of VCD and asthma that have been exacerbated by GERD and allergic rhinitis. She has been flaring since early January. We have been trying to aggressively treat allergies and GERD, weaned prednisone and stopped symbicort temporarily. She returns with improvement in her cough and UA wheeze, but she does have residual  cough. Unfortunately she is having more nasal congestion, ? URI. She is on allegra, fluticasone, omeprazole, NSW.   ROV 05/08/13 -- labile VCD and asthma, severe GERD and rhinitis. Returns for f/u. Last time we restarted symbicort (stopped temporarily due to UA irritation). She remains on fluticasone, allegra, NSW, omeprazole bid. Has required albuterol nebs more frequently. Having more exertional SOB, sudden wheezing with exertion.   Follow up 07/31/2013  Patient returns for a one-week followup. She was seen last week for an acute office visit for asthmatic flare. She was treated with a one-week course of prednisone taper. Patient reports that she is improved, however continues to have intermittent wheezing, and cough. She denies any discolored mucus, or fever, chest pain, orthopnea, PND, or leg swelling. Still has drainage in throat.  Objective:   Physical Exam Filed Vitals:   07/31/13 0934  BP: 126/76  Pulse: 83  Temp: 97.6 F (36.4 C)  TempSrc: Oral  Height: 5\' 2"  (1.575 m)  Weight: 193 lb 12.8 oz (87.907 kg)  SpO2: 100%   Gen: Pleasant, overwt, in no distress,  normal affect,   ENT: No lesions,  mouth clear,  oropharynx clear, no postnasal drip  Neck: supple no JVD, no stridor   Lungs : BS, few scant wheezing .   Cardiovascular: RRR, heart sounds normal, no murmur or gallops, no peripheral edema  Musculoskeletal: No  deformities, no cyanosis or clubbing  Neuro: alert, non focal  Skin: Warm, no lesions or rashes      Assessment & Plan:     No problem-specific assessment & plan notes found for this encounter.

## 2013-08-01 NOTE — Progress Notes (Signed)
Quick Note:  Called spoke with patient, advised of cxr results / recs as stated by TP. Pt verbalized her understanding and denied any questions. ______ 

## 2013-08-16 ENCOUNTER — Encounter: Payer: Self-pay | Admitting: Cardiovascular Disease

## 2013-08-16 ENCOUNTER — Ambulatory Visit (INDEPENDENT_AMBULATORY_CARE_PROVIDER_SITE_OTHER): Payer: Medicare Other | Admitting: Cardiovascular Disease

## 2013-08-16 ENCOUNTER — Ambulatory Visit: Payer: Medicare Other | Admitting: Cardiovascular Disease

## 2013-08-16 VITALS — BP 134/70 | HR 76 | Ht 62.0 in | Wt 191.0 lb

## 2013-08-16 DIAGNOSIS — R079 Chest pain, unspecified: Secondary | ICD-10-CM

## 2013-08-16 DIAGNOSIS — E785 Hyperlipidemia, unspecified: Secondary | ICD-10-CM

## 2013-08-16 NOTE — Assessment & Plan Note (Signed)
Cholesterol is at goal.  Continue current dose of statin and diet Rx.  No myalgias or side effects.  F/U  LFT's in 6 months. Lab Results  Component Value Date   LDLCALC 62 09/02/2012

## 2013-08-16 NOTE — Progress Notes (Signed)
Patient ID: [name], female   DOB: 18-Oct-1946, 67 y.o.   MRN: [id number] 67 yo of Dr Jonny Ruiz referred in 2012  for dyspnea and syncope. No previous cardiac history. Last two months has had 3-4 episodes of dizzyness. Not positional Can occur sitting. Not related to exertion. Gets "dizzyheaded" and goes 'out" for a second. No trauma. No palpitations or SSCP. Reviewed ECG from 8/7 normal. Reviewed echo 8/31 normal with EF 60%. No previous history of othostasis and not orthostatic on review of vitals from primary visits. Has chronic dyspnea from asthma. Uses inhalers. No recent prednisone. Quit smoking a long time ago. CRF;s family history and elevated lipids on Rx  Pain is noncardiac  About a month Nonexertional 1-3/week  Lasts 5 minutes. Radiates to neck and left arm  Not positional or pleuritic.  Cannot walk on treadmill due to left knee pain.  Asthma has been worse and has f/u with [facility] next week  Now having atypical chest pain    08/19/11  Normal myovue EF 79% 09/02/12  Normal ecoh EF 55-60%  ROS: Denies fever, malais, weight loss, blurry vision, decreased visual acuity, cough, sputum, SOB, hemoptysis, pleuritic pain, palpitaitons, heartburn, abdominal pain, melena, lower extremity edema, claudication, or rash.  All other systems reviewed and negative  General: Affect appropriate Healthy:  appears stated age HEENT: normal Neck supple with no adenopathy JVP normal no bruits no thyromegaly Lungs  moderate wheezing and good diaphragmatic motion Heart:  S1/S2 no murmur, no rub, gallop or click PMI normal Abdomen: benighn, BS positve, no tenderness, no AAA no bruit.  No HSM or HJR Distal pulses intact with no bruits No edema Neuro non-focal Skin warm and dry No muscular weakness   Current Outpatient Prescriptions  Medication Sig Dispense Refill  . albuterol (PROVENTIL HFA) 108 (90 BASE) MCG/ACT inhaler Inhale 2 puffs into the lungs every 4 (four) hours as needed. For shortness of  breath/wheezing.      Marland Kitchen albuterol (PROVENTIL) (2.5 MG/3ML) 0.083% nebulizer solution Take 2.5 mg by nebulization every 4 (four) hours as needed for shortness of breath. For shortness of breath.      Marland Kitchen aspirin EC 81 MG tablet Take 81 mg by mouth daily.      Marland Kitchen BLACK COHOSH PO Take 1 capsule by mouth daily.       . budesonide-formoterol (SYMBICORT) 160-4.5 MCG/ACT inhaler Inhale 2 puffs into the lungs 2 (two) times daily.  1 Inhaler  0  . fluticasone (FLONASE) 50 MCG/ACT nasal spray Place 1 spray into the nose 2 (two) times daily.       Marland Kitchen HYDROcodone-homatropine (HYCODAN) 5-1.5 MG/5ML syrup Take 5 mLs by mouth every 6 (six) hours as needed for cough.  240 mL  0  . HYDROcodone-homatropine (HYDROMET) 5-1.5 MG/5ML syrup Take 5 mLs by mouth every 6 (six) hours as needed for cough.  240 mL  0  . methocarbamol (ROBAXIN) 500 MG tablet Take 2 tablets (1,000 mg total) by mouth 3 (three) times daily.  20 tablet  0  . omeprazole (PRILOSEC) 20 MG capsule TAKE ONE CAPSULE BY MOUTH TWICE DAILY  60 capsule  6  . pravastatin (PRAVACHOL) 40 MG tablet Take 40 mg by mouth daily.       No current facility-administered medications for this visit.    Allergies  Penicillins  Electrocardiogram:  NSR normal ECG rate 63    Assessment and Plan

## 2013-08-16 NOTE — Assessment & Plan Note (Signed)
Atypica with normal studies in 2012 and 2013  No need for repeat nuclear study at this time.  F/U Dr Delton Coombes consider prednisone for asthma  Suspect muscular inflamation and asthma flair making chest hurt  Told her tylenol and motrin ok to take

## 2013-09-11 ENCOUNTER — Ambulatory Visit (INDEPENDENT_AMBULATORY_CARE_PROVIDER_SITE_OTHER): Payer: Medicare Other | Admitting: Emergency Medicine

## 2013-09-11 ENCOUNTER — Encounter: Payer: Self-pay | Admitting: Emergency Medicine

## 2013-09-11 ENCOUNTER — Telehealth: Payer: Self-pay | Admitting: Oncology

## 2013-09-11 VITALS — BP 138/80 | HR 94 | Ht 62.0 in | Wt 195.2 lb

## 2013-09-11 DIAGNOSIS — J383 Other diseases of vocal cords: Secondary | ICD-10-CM

## 2013-09-11 DIAGNOSIS — Z23 Encounter for immunization: Secondary | ICD-10-CM

## 2013-09-11 MED ORDER — ALBUTEROL SULFATE (2.5 MG/3ML) 0.083% IN NEBU: 2.5000 mg | INHALATION_SOLUTION | RESPIRATORY_TRACT | Status: DC | PRN

## 2013-09-11 NOTE — Progress Notes (Signed)
Subjective:    Patient ID: [name], female    DOB: 16-Feb-1946   MRN: [id number] HPI 67 -year-old woman with a history of asthma and vocal cord dysfunction both of which have been significantly impacted by severe esophageal reflux. Has a hx esophageal stricture dilations by Dr Marina Goodell. On omeprazole 20mg  two times a day. Uses Symbicort bid.   ROV 06/19/12 -- Hx of VCD and asthma that have been exacerbated by GERD and allergic rhinitis. Formerly on chronic steroids but we successfully weaned to off. She tells me that she was admitted to [facility] for HA and CP, L arm numbness - ECG was reassuring. During that admit she had more trouble w her breathing, treated w short burst pred. Still w some allergic sx, taking fluticasone. Also on omeprazole bid. In general her control is OK - using her SABA about 2x a day.   Acute ov 12/10/12 cc much worse since 1/4/7 was doing well on just symbicort but abuptly worse on 1/4 ? p Ran out of symbicort and prilosec only using q am   with poor hfa was dry cough/ wheeze and sob now with min activity.  Was not using any saba  Then since exac using up to every 4 hours.  ROV 12/20/12 -- Hx of VCD and asthma that have been exacerbated by GERD and allergic rhinitis. She saw Dr Sherene Sires as above, treated for AE of her VCD with prednisone. She states that she never ran out of symbicort or omeprazole. Has also been seen in ED 2 days ago and rx solumedrol and more pred. No other associated sx or factors. She is having UA wheeze, cough (non-productive). She has stayed on the allegra and nasal steroid. Nothing has made this any better - not prednisone, BD's etc.   ROV 01/28/13 -- Hx of VCD and asthma that have been exacerbated by GERD and allergic rhinitis. She has been flaring since early January. We have been trying to aggressively treat allergies and GERD, weaned prednisone and stopped symbicort temporarily. She returns with improvement in her cough and UA wheeze, but she does have residual  cough. Unfortunately she is having more nasal congestion, ? URI. She is on allegra, fluticasone, omeprazole, NSW.   ROV 05/08/13 -- labile VCD and asthma, severe GERD and rhinitis. Returns for f/u. Last time we restarted symbicort (stopped temporarily due to UA irritation). She remains on fluticasone, allegra, NSW, omeprazole bid. Has required albuterol nebs more frequently. Having more exertional SOB, sudden wheezing with exertion.   Follow up 07/31/13 Patient returns for a one-week followup. She was seen last week for an acute office visit for asthmatic flare. She was treated with a one-week course of prednisone taper. Patient reports that she is improved, however continues to have intermittent wheezing, and cough. She denies any discolored mucus, or fever, chest pain, orthopnea, PND, or leg swelling. Still has drainage in throat.  ROV 09/11/13 -- labile VCD and asthma, severe GERD and rhinitis. Was seen by TP in 8/14 for an acute flare, treated w pred. Returns today reporting that she is better. She notes that the prednisone gave her a vaginal rash, probably yeast infxn. She is on symbicort, albuterol prn. She is having wheeze in the am and mid-afternoon. Albuterol helps   Objective:   Physical Exam Filed Vitals:   09/11/13 1354  BP: 138/80  Pulse: 94  Height: 5\' 2"  (1.575 m)  Weight: 195 lb 3.2 oz (88.542 kg)  SpO2: 97%   Gen: Pleasant, overwt,  in no distress,  normal affect,   ENT: No lesions,  mouth clear,  oropharynx clear, no postnasal drip  Neck: supple no JVD, no stridor   Lungs : BS, few scant wheezing .   Cardiovascular: RRR, heart sounds normal, no murmur or gallops, no peripheral edema  Musculoskeletal: No deformities, no cyanosis or clubbing  Neuro: alert, non focal  Skin: Warm, no lesions or rashes    Assessment & Plan:     VOCAL CORD DISORDER Will continue symbicort bid, start doing albuterol in am and at  Mid-day to see if we can avoid afternoon symptoms.  Continue omeprazole bid.

## 2013-09-11 NOTE — Patient Instructions (Signed)
Please continue your symbicort twice a day Start taking your albuterol in the morning and in the middle of the day to help with breakthrough symptoms.  Flu Shot today Follow with Dr Delton Coombes in 4 months or sooner if you have any problems.

## 2013-09-11 NOTE — Telephone Encounter (Signed)
Left pt vm to return on in ref to np appt

## 2013-09-11 NOTE — Assessment & Plan Note (Signed)
Will continue symbicort bid, start doing albuterol in am and at  Mid-day to see if we can avoid afternoon symptoms. Continue omeprazole bid.

## 2013-09-12 ENCOUNTER — Telehealth: Payer: Self-pay | Admitting: Emergency Medicine

## 2013-09-12 NOTE — Telephone Encounter (Signed)
Pt aware samples symbicort samples available and at front for pick up

## 2013-10-08 ENCOUNTER — Telehealth: Payer: Self-pay | Admitting: Hematology & Oncology

## 2013-10-08 NOTE — Telephone Encounter (Signed)
Rick called and spoke w NEW PATIENT today to change and remind them of their appointment with Dr. Myna Hidalgo.

## 2013-10-10 ENCOUNTER — Other Ambulatory Visit (HOSPITAL_BASED_OUTPATIENT_CLINIC_OR_DEPARTMENT_OTHER): Payer: Medicare Other | Admitting: Lab

## 2013-10-10 ENCOUNTER — Ambulatory Visit: Payer: Medicare Other | Admitting: Hematology & Oncology

## 2013-10-10 ENCOUNTER — Ambulatory Visit: Payer: Medicare Other

## 2013-10-10 ENCOUNTER — Other Ambulatory Visit: Payer: Medicare Other | Admitting: Lab

## 2013-10-10 ENCOUNTER — Ambulatory Visit (HOSPITAL_BASED_OUTPATIENT_CLINIC_OR_DEPARTMENT_OTHER): Payer: Medicare Other | Admitting: Hematology & Oncology

## 2013-10-10 VITALS — BP 133/65 | HR 71 | Temp 97.9°F | Resp 14 | Ht 62.0 in | Wt 192.0 lb

## 2013-10-10 DIAGNOSIS — D473 Essential (hemorrhagic) thrombocythemia: Secondary | ICD-10-CM

## 2013-10-10 LAB — CBC WITH DIFFERENTIAL (CANCER CENTER ONLY)
BASO#: 0.1 10*3/uL (ref 0.0–0.2)
EOS%: 3.6 % (ref 0.0–7.0)
Eosinophils Absolute: 0.4 10*3/uL (ref 0.0–0.5)
HCT: 37.8 % (ref 34.8–46.6)
HGB: 12.5 g/dL (ref 11.6–15.9)
LYMPH#: 1.7 10*3/uL (ref 0.9–3.3)
MCHC: 33.1 g/dL (ref 32.0–36.0)
MONO#: 0.6 10*3/uL (ref 0.1–0.9)
NEUT#: 7.8 10*3/uL — ABNORMAL HIGH (ref 1.5–6.5)
NEUT%: 74.1 % (ref 39.6–80.0)
Platelets: 572 10*3/uL — ABNORMAL HIGH (ref 145–400)
RBC: 5.67 10*6/uL — ABNORMAL HIGH (ref 3.70–5.32)

## 2013-10-10 LAB — FERRITIN CHCC: Ferritin: 154 ng/ml (ref 9–269)

## 2013-10-10 LAB — IRON AND TIBC CHCC: TIBC: 248 ug/dL (ref 236–444)

## 2013-10-10 LAB — CHCC SATELLITE - SMEAR

## 2013-10-10 NOTE — Progress Notes (Signed)
This office note has been dictated.

## 2013-10-11 NOTE — Progress Notes (Signed)
CC:   [name], M.D.  DIAGNOSIS:  Thrombocytosis.  HISTORY OF PRESENT ILLNESS:  [name] is a very charming 67 year old African American female.  She is followed by Dr. Nicholos Johns at [facility].  Dr. Nicholos Johns has noted that [name] has had some issues with elevated platelet count.  Going back to 2011, CBC showed a white cell count of 9.7, hemoglobin 12.3, hematocrit 38.1, and platelet count was 576.  MCV at that time was 71.  In August 2012, her platelet count was up to 724,000.  Again, she was not anemic, with a hemoglobin of 13 and white cell count was 9.2.  Her MCV was still low at 70.  In January of this year, CBC showed white cell count 11.5, hemoglobin 12.8, hematocrit 38.2, and platelet count 534.  MCV was 66.  Lab work that had been done in the past with electrolytes was pretty much unrevealing.  Back in September 2013, her BUN and creatinine were okay.  Liver function tests looked alright.  She had a CT of the head back in September 2013.  This showed a "empty sella appearance."  This was chronic.  In 2009, she had a CT angiogram of the chest.  Everything looked fine with no evidence of pulmonary embolism.  She was kindly referred to the [facility] for an evaluation of the thrombocytosis.  She does not have any kind of burning in the hands or feet.  She has some tingling in the feet.  She has had no rashes.  She is not a vegetarian.  She has had no change in bowel or bladder habits.  Her last colonoscopy was about 5 years ago.  She has had no swollen lymph glands.  Of note, she had a C-reactive protein done in October which was 6.4. She had hepatitis serologies also in October which were all negative. She had an HLA-B27 test which was negative.  These were all ordered by Rheumatology.  She had normal sedimentation rate.  Uric acid was okay.  She had a CBC in early October that showed a white cell  count of 10.5, hemoglobin 14, hematocrit 41.3, and platelet count was 778,000.  MCV was 67.  There have been no hot flashes or sweats.  She has had no joint issues, has had some chronic arthralgias.  PAST MEDICAL HISTORY:  Remarkable for: 1. Hyperlipidemia. 2. GERD. 3. Asthma. 4. Postmenopausal symptoms. 5. Multiple surgeries, mostly for joint issues.  ALLERGIES:  Penicillins.  MEDICATIONS:  Albuterol inhaler 2 puffs q.4 h. p.r.n., aspirin 81 mg p.o. q. day, Symbicort inhaler (160/4.5) 2 puffs b.i.d., Flonase 1 spray into the nose daily, Hycodan cough medicine 5 cc q.6 h. p.r.n., Robaxin 1000 mg p.o. t.i.d., Prilosec 20 mg p.o. b.i.d., and Pravachol 40 mg p.o. q. day.  SOCIAL HISTORY:  Remarkable for remote tobacco use.  She has not smoked for over 30 years.  She has no alcohol use.  There are no obvious occupational exposures.  She used to be, I think, a home health aide.  FAMILY HISTORY:  Remarkable for her father had colon cancer when he was 60 years old.  A niece has breast cancer.  She says that her niece is 64 years old.  There is no obvious sickle cell in the family.  REVIEW OF SYSTEMS:  As stated in the history of present illness.  No additional findings are noted on a 12-system review.  PHYSICAL EXAMINATION:  General:  This is a somewhat obese  African American female in no obvious distress.  Vital Signs:  Temperature of 97.9, pulse 71, respiratory rate 14, blood pressure 133/65.  Weight is 192 pounds.  Head and Neck:  She has a normocephalic, atraumatic skull. There are no ocular or oral lesions.  She has no scleral icterus.  There is no facial plethora.  Pupils react appropriately.  She has no intraoral petechia or ecchymoses.  There is no adenopathy noted on her neck.  Thyroid is nonpalpable.  Lungs:  Clear to percussion and auscultation bilaterally.  She has no rales, wheezes, or rhonchi. Cardiac:  Regular rate and rhythm with a normal S1 and S2.  There are  no murmurs, rubs, or bruits.  Abdomen:  Soft.  She is obese.  She has good bowel sounds.  There is no fluid wave.  There is no guarding, rebound, or tenderness.  There is no palpable hepatosplenomegaly.  Back:  No tenderness over the spine, ribs, or hips.  Extremities:  No clubbing, cyanosis, or edema.  She has some osteoarthritic changes in her joints. There is good strength in her muscles in the upper and lower extremities.  Neurological:  No focal neurological deficits.  Skin:  No rashes, ecchymosis, or petechia.  LABORATORY STUDIES:  White cell count is 10.6, hemoglobin 12.5, hematocrit 37.8, platelet count 573.  MCV is 67.  Peripheral blood smear shows mild anisocytosis.  I see no target cells.  There are some microcytic red cells.  I see no inclusion bodies.  There is no schistocytes or spherocytes.  I do not see any rouleaux formation.  No teardrop cells noted.  White cells appear minimally increased in number. She has good maturation of her white blood cells.  I see no hypersegmented polys.  I see no immature myeloid or lymphoid forms. There are no blasts.  Platelets are increased in number.  She has several large platelets.  Platelets appear to be well granulated.  IMPRESSION:  [name] is a very charming 67 year old African American female with thrombocytosis.  I am actually struck by the fact that she is not anemic, but yet has a very low MCV.  One would have to wonder if she does not have some underlying hemoglobinopathy.  Given that she is African American, alpha-thalassemia certainly could be a possibility.  I would have to suspect that she probably does have an underlying myeloproliferative issue.  Just the appearance of the blood smear with the platelets would go along with a myeloproliferative neoplasm.  I did send off a JAK2 assay on her.  I will see what this shows.  I also sent off a Von Willebrand panel on her.  Again, I have to suspect that this is  going to turn out to be an underlying myeloproliferative disease.  She may need to have a bone marrow biopsy done to confirm the bone marrow disorder if her JAK2 comes back positive.  I think, if we find that she is iron deficient, then she is going to definitely need an endoscopy, both lower and upper.  I spent a good hour or more with her.  I explained what I felt was going on.  I reviewed the lab work with her.  I did give her a prayer blanket, which she very much appreciated.  I told [name] that we would get back with her once we got the results back from our lab studies, and then these will dictate what we have to do next.    ______________________________ Rose Phi  Myna Hidalgo, M.D. PRE/MEDQ  D:  10/08/2013  T:  10/11/2013  Job:  [id number]

## 2013-10-14 LAB — RETICULOCYTES (CHCC)
RBC.: 5.69 MIL/uL — ABNORMAL HIGH (ref 3.87–5.11)
Retic Ct Pct: 1.2 % (ref 0.4–2.3)

## 2013-10-14 LAB — VON WILLEBRAND PANEL
Ristocetin Co-factor, Plasma: 161 % (ref 42–200)
Von Willebrand Antigen, Plasma: 201 % (ref 50–217)

## 2013-10-14 LAB — HEMOGLOBINOPATHY EVALUATION
Hgb A2 Quant: 3.4 % — ABNORMAL HIGH (ref 2.2–3.2)
Hgb A: 69.5 % — ABNORMAL LOW (ref 96.8–97.8)
Hgb F Quant: 0.6 % (ref 0.0–2.0)
Hgb S Quant: 26.5 % — ABNORMAL HIGH

## 2013-10-15 ENCOUNTER — Other Ambulatory Visit: Payer: Self-pay | Admitting: Rheumatology

## 2013-10-15 DIAGNOSIS — R52 Pain, unspecified: Secondary | ICD-10-CM

## 2013-10-21 ENCOUNTER — Other Ambulatory Visit: Payer: Self-pay | Admitting: Hematology & Oncology

## 2013-10-21 ENCOUNTER — Ambulatory Visit
Admission: RE | Admit: 2013-10-21 | Discharge: 2013-10-21 | Disposition: A | Discharge status: Home / Self Care | Payer: Medicare Other | Source: Ambulatory Visit | Attending: Rheumatology | Admitting: Rheumatology

## 2013-10-21 DIAGNOSIS — R52 Pain, unspecified: Secondary | ICD-10-CM

## 2013-10-21 DIAGNOSIS — D471 Chronic myeloproliferative disease: Secondary | ICD-10-CM

## 2013-11-06 HISTORY — PX: KNEE ARTHROSCOPY: SHX127

## 2013-11-11 DIAGNOSIS — Z9889 Other specified postprocedural states: Secondary | ICD-10-CM | POA: Insufficient documentation

## 2013-11-12 ENCOUNTER — Other Ambulatory Visit (HOSPITAL_BASED_OUTPATIENT_CLINIC_OR_DEPARTMENT_OTHER): Payer: Medicare Other | Admitting: Lab

## 2013-11-12 ENCOUNTER — Ambulatory Visit (HOSPITAL_BASED_OUTPATIENT_CLINIC_OR_DEPARTMENT_OTHER): Payer: Medicare Other | Admitting: Hematology & Oncology

## 2013-11-12 VITALS — BP 134/85 | HR 85 | Temp 97.9°F | Resp 14 | Ht 62.0 in | Wt 191.0 lb

## 2013-11-12 DIAGNOSIS — D473 Essential (hemorrhagic) thrombocythemia: Secondary | ICD-10-CM

## 2013-11-12 DIAGNOSIS — D471 Chronic myeloproliferative disease: Secondary | ICD-10-CM

## 2013-11-12 LAB — CBC WITH DIFFERENTIAL (CANCER CENTER ONLY)
BASO%: 0.6 % (ref 0.0–2.0)
EOS%: 4 % (ref 0.0–7.0)
Eosinophils Absolute: 0.4 10*3/uL (ref 0.0–0.5)
LYMPH#: 1.6 10*3/uL (ref 0.9–3.3)
LYMPH%: 18 % (ref 14.0–48.0)
MCH: 21.8 pg — ABNORMAL LOW (ref 26.0–34.0)
MCV: 68 fL — ABNORMAL LOW (ref 81–101)
MONO%: 7.2 % (ref 0.0–13.0)
NEUT#: 6.4 10*3/uL (ref 1.5–6.5)
Platelets: 606 10*3/uL — ABNORMAL HIGH (ref 145–400)
RBC: 5.68 10*6/uL — ABNORMAL HIGH (ref 3.70–5.32)
RDW: 18.3 % — ABNORMAL HIGH (ref 11.1–15.7)
WBC: 9.1 10*3/uL (ref 3.9–10.0)

## 2013-11-12 LAB — CHCC SATELLITE - SMEAR

## 2013-11-12 NOTE — Progress Notes (Signed)
Hematology and Oncology Follow Up Visit  [name] [id number] 07-Nov-1946 67 y.o. 11/12/2013   Principle Diagnosis:   #1 JAK2 (+) myeloproliferative disorder.  #2 sickle cell trait  #3 iron deficiency  Current Therapy:    Observation     Interim History:  [name] is in for followup. We first saw her her back on November 6. At that point on, she had thrombocytosis. Her lab work show that there is some mild iron deficiency. He really was not anemic. What was important, however, was that she was positive for the JAK2 mutation. In addition, that she was positive for sickle cell trait. She did not know this. We'll check an erythropoietin level on her. This was low at 6.1. We also checked a von Willebrand's panel and this was normal.  She recently underwent arthroscopic surgery for her left knee. He had no problems with This. This was a week ago. She is getting around okay.  She does feel a little tired. She has not noticed any obvious bleeding. She's had no cough. There's been some slight swelling of her left leg.   Medications: Current outpatient prescriptions:albuterol (PROVENTIL HFA) 108 (90 BASE) MCG/ACT inhaler, Inhale 2 puffs into the lungs every 4 (four) hours as needed. For shortness of breath/wheezing., Disp: , Rfl: ;  albuterol (PROVENTIL) (2.5 MG/3ML) 0.083% nebulizer solution, Take 3 mLs (2.5 mg total) by nebulization every 4 (four) hours as needed for shortness of breath. For shortness of breath., Disp: 75 mL, Rfl: 11 aspirin EC 81 MG tablet, Take 81 mg by mouth daily., Disp: , Rfl: ;  BLACK COHOSH PO, Take 1 capsule by mouth daily. , Disp: , Rfl: ;  budesonide-formoterol (SYMBICORT) 160-4.5 MCG/ACT inhaler, Inhale 2 puffs into the lungs 2 (two) times daily., Disp: 1 Inhaler, Rfl: 0;  fluticasone (FLONASE) 50 MCG/ACT nasal spray, Place 1 spray into the nose 2 (two) times daily. , Disp: , Rfl:  methocarbamol (ROBAXIN) 500 MG tablet, Take 2 tablets (1,000 mg total) by mouth  3 (three) times daily., Disp: 20 tablet, Rfl: 0;  omeprazole (PRILOSEC) 20 MG capsule, TAKE ONE CAPSULE BY MOUTH TWICE DAILY, Disp: 60 capsule, Rfl: 6;  pravastatin (PRAVACHOL) 40 MG tablet, Take 40 mg by mouth daily., Disp: , Rfl: ;  HYDROcodone-homatropine (HYCODAN) 5-1.5 MG/5ML syrup, Take 5 mLs by mouth every 6 (six) hours as needed for cough., Disp: 240 mL, Rfl: 0  Allergies:  Allergies  Allergen Reactions  . Penicillins Itching and Rash    Past Medical History, Surgical history, Social history, and Family History were reviewed and updated.  Review of Systems: As above  Physical Exam:  height is 5\' 2"  (1.575 m) and weight is 191 lb (86.637 kg). Her oral temperature is 97.9 F (36.6 C). Her blood pressure is 134/85 and her pulse is 85. Her respiration is 14.   This is a well-developed well-nourished African female in no obvious distress. She has no ocular or oral lesions. She has no adenopathy in the neck. Thyroid is not palpable. Lungs are clear. Cardiac exam regular rate and rhythm with no murmurs rubs or bruits. Abdomen is soft. She is mildly obese. She has decent bowel sounds. There is no fluid wave. There is no palpable liver or spleen. Extremities shows some slight swelling of the left knee. She has the healing arthroscopic scars. Right leg is unremarkable. Skin exam shows no rashes. Neurological exam shows no focal neurological deficits.    Lab Results  Component Value Date  WBC 9.1 11/12/2013   HGB 12.4 11/12/2013   HCT 38.4 11/12/2013   MCV 68* 11/12/2013   PLT 606 Large platelets present* 11/12/2013     Chemistry      Component Value Date/Time   NA 139 12/18/2012 1810   K 3.5 12/18/2012 1810   CL 102 12/18/2012 1810   CO2 26 12/18/2012 1810   BUN 10 12/18/2012 1810   CREATININE 0.79 12/18/2012 1810      Component Value Date/Time   CALCIUM 9.4 12/18/2012 1810   ALKPHOS 77 09/01/2012 1912   AST 19 09/01/2012 1912   ALT 13 09/01/2012 1912   BILITOT 0.2* 09/01/2012 1912          Impression and Plan: [name] is a 67 year old African female with a couple hematologic issues. She had sickle cell trait. She also is JAK2 positive  I suspect that she may have essential thrombocythemia. She may also have polycythemia. As such, we need to do a bone marrow test on her. I'll have radiology do this.  I spoke to her at length. I explained to her what was going on. I told her what I thought we needed to do. It is hard to know how to proceed with treating the underlying thrombocytosis. I think the bone marrow test will help [place] out. I do not want to give her iron is she has polycythemia.  The sickle cell trait certainly does not seem to be a problem for her.  We will get everything set up for her after the holidays. We can wait that long. I do not see a problem with that. She is alert and aspirin.     Josph Macho, MD 12/9/20142:29 PM

## 2013-11-15 LAB — RETICULOCYTES (CHCC)
RBC.: 5.72 MIL/uL — ABNORMAL HIGH (ref 3.87–5.11)
Retic Ct Pct: 1.1 % (ref 0.4–2.3)

## 2013-11-20 ENCOUNTER — Telehealth: Payer: Self-pay | Admitting: Emergency Medicine

## 2013-11-20 NOTE — Telephone Encounter (Signed)
Pt requesting a sample of symbicort.  Per RB ok.  This was left at front desk for pt to pick up. Nothing further is needed

## 2013-12-04 NOTE — Progress Notes (Addendum)
Trial Court Administrator Attention: Lonn [place] Request [address] [place], [place]  [address]  Dear Carmelina Dane,  This letter is to request a postponement for [place] [employer] duty for Ms. Elmarie Mainland.  Her juror # is [id number].  She has been requested to report for jury duty on January 7,2015.  She has a bone marrow condition that we are treating.  She is scheduled for a bone marrow biopsy on Jan. 8,2015.  This is a very important procedure that she cannot miss as this will help [place] in deciding her therapy options.  I respectfully request that her jury duty responsibilities be postponed for 6 months as we initiate her therapy - which will be chemotherapy.  I very much appreciate the Court's consideration in this matter.   Ms. Ribera does feel that she has a civic duty to participate in the judicial process.  She currently has a medical condition that is preventing her from engaging in the judicial process.  If any further medical information is needed, please feel free to contact me at [phone].  Respectfully,   Rose Phi. Myna Hidalgo, MD

## 2013-12-06 ENCOUNTER — Encounter (HOSPITAL_COMMUNITY): Payer: Self-pay | Admitting: Pharmacy Technician

## 2013-12-10 ENCOUNTER — Other Ambulatory Visit: Payer: Self-pay | Admitting: Radiology

## 2013-12-12 ENCOUNTER — Ambulatory Visit (HOSPITAL_COMMUNITY)
Admission: RE | Admit: 2013-12-12 | Discharge: 2013-12-12 | Disposition: A | Discharge status: Home / Self Care | Payer: Medicare Other | Source: Ambulatory Visit | Attending: Hematology & Oncology | Admitting: Hematology & Oncology

## 2013-12-12 ENCOUNTER — Other Ambulatory Visit: Payer: Self-pay | Admitting: Hematology & Oncology

## 2013-12-12 ENCOUNTER — Encounter (HOSPITAL_COMMUNITY): Payer: Self-pay

## 2013-12-12 DIAGNOSIS — M810 Age-related osteoporosis without current pathological fracture: Secondary | ICD-10-CM | POA: Insufficient documentation

## 2013-12-12 DIAGNOSIS — Z87891 Personal history of nicotine dependence: Secondary | ICD-10-CM | POA: Insufficient documentation

## 2013-12-12 DIAGNOSIS — D75839 Thrombocytosis, unspecified: Secondary | ICD-10-CM

## 2013-12-12 DIAGNOSIS — E785 Hyperlipidemia, unspecified: Secondary | ICD-10-CM | POA: Insufficient documentation

## 2013-12-12 DIAGNOSIS — D473 Essential (hemorrhagic) thrombocythemia: Secondary | ICD-10-CM

## 2013-12-12 DIAGNOSIS — K219 Gastro-esophageal reflux disease without esophagitis: Secondary | ICD-10-CM | POA: Insufficient documentation

## 2013-12-12 DIAGNOSIS — E119 Type 2 diabetes mellitus without complications: Secondary | ICD-10-CM | POA: Insufficient documentation

## 2013-12-12 DIAGNOSIS — K449 Diaphragmatic hernia without obstruction or gangrene: Secondary | ICD-10-CM | POA: Insufficient documentation

## 2013-12-12 DIAGNOSIS — D469 Myelodysplastic syndrome, unspecified: Secondary | ICD-10-CM | POA: Insufficient documentation

## 2013-12-12 DIAGNOSIS — J45909 Unspecified asthma, uncomplicated: Secondary | ICD-10-CM | POA: Insufficient documentation

## 2013-12-12 LAB — BONE MARROW EXAM

## 2013-12-12 LAB — APTT: APTT: 25 s (ref 24–37)

## 2013-12-12 LAB — PROTIME-INR
INR: 0.98 (ref 0.00–1.49)
Prothrombin Time: 12.8 seconds (ref 11.6–15.2)

## 2013-12-12 MED ORDER — HYDROCODONE-ACETAMINOPHEN 5-325 MG PO TABS: 1.0000 | ORAL_TABLET | ORAL | Status: DC | PRN

## 2013-12-12 MED ORDER — FENTANYL CITRATE 0.05 MG/ML IJ SOLN
INTRAMUSCULAR | Status: AC
  Filled 2013-12-12: qty 4

## 2013-12-12 MED ORDER — MIDAZOLAM HCL 2 MG/2ML IJ SOLN
INTRAMUSCULAR | Status: AC | PRN
  Administered 2013-12-12: 2 mg via INTRAVENOUS

## 2013-12-12 MED ORDER — FENTANYL CITRATE 0.05 MG/ML IJ SOLN
INTRAMUSCULAR | Status: AC | PRN
  Administered 2013-12-12: 50 ug via INTRAVENOUS

## 2013-12-12 MED ORDER — MIDAZOLAM HCL 2 MG/2ML IJ SOLN
INTRAMUSCULAR | Status: AC
  Filled 2013-12-12: qty 4

## 2013-12-12 MED ORDER — SODIUM CHLORIDE 0.9 % IV SOLN: INTRAVENOUS | Status: DC

## 2013-12-12 NOTE — Discharge Instructions (Signed)
Bone Marrow Aspiration, Bone Marrow Biopsy °Care After °Read the instructions outlined below and refer to this sheet in the next few weeks. These discharge instructions provide you with general information on caring for yourself after you leave the hospital. Your caregiver may also give you specific instructions. While your treatment has been planned according to the most current medical practices available, unavoidable complications occasionally occur. If you have any problems or questions after discharge, call your caregiver. °FINDING OUT THE RESULTS OF YOUR TEST °Not all test results are available during your visit. If your test results are not back during the visit, make an appointment with your caregiver to find out the results. Do not assume everything is normal if you have not heard from your caregiver or the medical facility. It is important for you to follow up on all of your test results.  °HOME CARE INSTRUCTIONS  °You have had sedation and may be sleepy or dizzy. Your thinking may not be as clear as usual. For the next 24 hours: °· Only take over-the-counter or prescription medicines for pain, discomfort, and or fever as directed by your caregiver. °· Do not drink alcohol. °· Do not smoke. °· Do not drive. °· Do not make important legal decisions. °· Do not operate heavy machinery. °· Do not care for small children by yourself. °· Keep your dressing clean and dry. You may replace dressing with a bandage after 24 hours. °· You may take a bath or shower after 24 hours. °· Use an ice pack for 20 minutes every 2 hours while awake for pain as needed. °SEEK MEDICAL CARE IF:  °· There is redness, swelling, or increasing pain at the biopsy site. °· There is pus coming from the biopsy site. °· There is drainage from a biopsy site lasting longer than one day. °· An unexplained oral temperature above 102° F (38.9° C) develops. °SEEK IMMEDIATE MEDICAL CARE IF:  °· You develop a rash. °· You have difficulty  breathing. °· You develop any reaction or side effects to medications given. °Document Released: 06/10/2005 Document Revised: 02/13/2012 Document Reviewed: 11/18/2008 °ExitCare® Patient Information ©2014 [employer]. °Moderate Sedation, Adult °Moderate sedation is given to help you relax or even sleep through a procedure. You may remain sleepy, be clumsy, or have poor balance for several hours following this procedure. Arrange for a responsible adult, family member, or friend to take you home. A responsible adult should stay with you for at least 24 hours or until the medicines have worn off. °· Do not participate in any activities where you could become injured for the next 24 hours, or until you feel normal again. Do not: °· Drive. °· Swim. °· Ride a bicycle. °· Operate heavy machinery. °· Cook. °· Use power tools. °· Climb ladders. °· Work at heights. °· Do not make important decisions or sign legal documents until you are improved. °· Vomiting may occur if you eat too soon. When you can drink without vomiting, try water, juice, or soup. Try solid foods if you feel little or no nausea. °· Only take over-the-counter or prescription medications for pain, discomfort, or fever as directed by your caregiver.If pain medications have been prescribed for you, ask your caregiver how soon it is safe to take them. °· Make sure you and your family fully understands everything about the medication given to you. Make sure you understand what side effects may occur. °· You should not drink alcohol, take sleeping pills, or medications that cause drowsiness   for at least 24 hours.  If you smoke, do not smoke alone.  If you are feeling better, you may resume normal activities 24 hours after receiving sedation.  Keep all appointments as scheduled. Follow all instructions.  Ask questions if you do not understand. SEEK MEDICAL CARE IF:   Your skin is pale or bluish in color.  You continue to feel sick to your stomach  (nauseous) or throw up (vomit).  Your pain is getting worse and not helped by medication.  You have bleeding or swelling.  You are still sleepy or feeling clumsy after 24 hours. SEEK IMMEDIATE MEDICAL CARE IF:   You develop a rash.  You have difficulty breathing.  You develop any type of allergic problem.  You have a fever. Document Released: 08/16/2001 Document Revised: 02/13/2012 Document Reviewed: 01/07/2008 [facility] Patient Information 2014 [place].

## 2013-12-12 NOTE — H&P (Signed)
Chief Complaint: "I am here for a bone marrow biopsy." Referring Physician: Dr. Marin Olp HPI: [name] is an 68 y.o. female who sees Dr. Marin Olp for JAK2 (+) myeloproliferative disorder, sickle cell trait, and iron deficiency. Dr. Marin Olp has requested a bone marrow biopsy for thrombocytosis. The patient denies any previous complications with sedation during a colonoscopy. She denies any active bleeding, fever or chills. She denies any chest pain or change in shortness of breath. She denies any history of sleep apnea or chronic oxygen use, she does use an inhaler for her asthma.   Past Medical History:  Past Medical History  Diagnosis Date  . Allergic rhinitis   . Asthma   . GERD (gastroesophageal reflux disease)   . Hyperlipidemia   . Vocal cord dysfunction   . Esophageal stricture   . Colonic polyp   . Osteoporosis   . DJD (degenerative joint disease) of knee     bilateral  . Hiatal hernia   . DDD (degenerative disc disease), lumbar 07/20/2011  . DJD (degenerative joint disease), lumbar 07/20/2011  . Diabetes mellitus, type 2     does not take medication; pt denies    Past Surgical History:  Past Surgical History  Procedure Laterality Date  . Carpal tunnel release      bilateral  . Total abdominal hysterectomy    . Foot surgery      bilateral  . Knee athroscopy  Dec.3, 2014    left knee    Family History:  Family History  Problem Relation Age of Onset  . Cancer Sister   . Heart attack Mother   . Asthma Brother   . Asthma      uncle  . Colon cancer Father 19  . Stomach cancer Neg Hx   . Esophageal cancer Neg Hx   . Rectal cancer Neg Hx     Social History:  reports that she quit smoking about 32 years ago. Her smoking use included Cigarettes. She has a 35 pack-year smoking history. She has never used smokeless tobacco. She reports that she does not drink alcohol or use illicit drugs.  Allergies:  Allergies  Allergen Reactions  . Penicillins Itching and Rash       Medication List    ASK your doctor about these medications       aspirin EC 81 MG tablet  Take 81 mg by mouth daily.     BLACK COHOSH PO  Take 1 capsule by mouth daily.     budesonide-formoterol 160-4.5 MCG/ACT inhaler  Commonly known as:  SYMBICORT  Inhale 2 puffs into the lungs 2 (two) times daily.     fluticasone 50 MCG/ACT nasal spray  Commonly known as:  FLONASE  Place 1 spray into the nose 2 (two) times daily.     HYDROcodone-homatropine 5-1.5 MG/5ML syrup  Commonly known as:  HYCODAN  Take 5 mLs by mouth every 6 (six) hours as needed for cough.     methocarbamol 500 MG tablet  Commonly known as:  ROBAXIN  Take 1,000 mg by mouth 3 (three) times daily.     omeprazole 20 MG capsule  Commonly known as:  PRILOSEC  Take 20 mg by mouth 2 (two) times daily before a meal.     pravastatin 40 MG tablet  Commonly known as:  PRAVACHOL  Take 40 mg by mouth daily.     PROVENTIL HFA 108 (90 BASE) MCG/ACT inhaler  Generic drug:  albuterol  Inhale 2 puffs into the lungs  every 4 (four) hours as needed for wheezing or shortness of breath.     albuterol (2.5 MG/3ML) 0.083% nebulizer solution  Commonly known as:  PROVENTIL  Take 2.5 mg by nebulization every 4 (four) hours as needed for shortness of breath. For shortness of breath.       Please HPI for pertinent positives, otherwise complete 10 system ROS negative.  Physical Exam: BP 134/84  Pulse 86  Temp(Src) 98.1 F (36.7 C) (Oral)  Resp 18  Ht 5' 2"  (1.575 m)  Wt 191 lb (86.637 kg)  BMI 34.93 kg/m2  SpO2 100% Body mass index is 34.93 kg/(m^2).  General Appearance:  Alert, cooperative, no distress  Head:  Normocephalic, without obvious abnormality, atraumatic  Neck: Supple, symmetrical, trachea midline  Lungs:   Clear to auscultation bilaterally, no w/r/r, respirations unlabored without use of accessory muscles.  Chest Wall:  No tenderness or deformity  Heart:  Regular rate and rhythm, S1, S2 normal, no  murmur, rub or gallop.  Abdomen:   Soft, non-tender, non distended, (+) BS  Extremities: Extremities normal, atraumatic, no cyanosis or edema  Pulses: 1+ and symmetric  Neurologic: Normal affect, no gross deficits.   Results for orders placed during the hospital encounter of 12/12/13 (from the past 48 hour(s))  CBC     Status: Abnormal   Collection Time    12/12/13  7:50 AM      Result Value Range   WBC 9.4  4.0 - 10.5 K/uL   Comment: WHITE COUNT CONFIRMED ON SMEAR   RBC 5.64 (*) 3.87 - 5.11 MIL/uL   Hemoglobin 12.4  12.0 - 15.0 g/dL   HCT 38.4  36.0 - 46.0 %   MCV 68.1 (*) 78.0 - 100.0 fL   MCH 22.0 (*) 26.0 - 34.0 pg   MCHC 32.3  30.0 - 36.0 g/dL   RDW 16.5 (*) 11.5 - 15.5 %   Platelets 355  150 - 400 K/uL   Comment: SPECIMEN CHECKED FOR CLOTS     REPEATED TO VERIFY     PLATELET COUNT CONFIRMED BY SMEAR     PLATELET CLUMPS NOTED ON SMEAR   No results found.  Assessment/Plan Thrombocytosis. JAK2 (+) myeloproliferative disorder. Sickle cell trait. Iron deficiency anemia. Request for image guided bone marrow biopsy. Patient has been NPO, no blood thinners, labs reviewed Risks and Benefits discussed with the patient. All of the patient's questions were answered, patient is agreeable to proceed. Consent signed and in chart.   Tsosie Billing D PA-C 12/12/2013, 8:37 AM

## 2013-12-12 NOTE — Procedures (Signed)
CT-guided  R iliac bone marrow aspiration and core biopsy No complication No blood loss. See complete dictation in [facility] PACS  

## 2013-12-13 LAB — CBC
HCT: 38.4 % (ref 36.0–46.0)
Hemoglobin: 12.4 g/dL (ref 12.0–15.0)
MCH: 22 pg — ABNORMAL LOW (ref 26.0–34.0)
MCHC: 32.3 g/dL (ref 30.0–36.0)
MCV: 68.1 fL — ABNORMAL LOW (ref 78.0–100.0)
RBC: 5.64 MIL/uL — ABNORMAL HIGH (ref 3.87–5.11)
RDW: 16.5 % — ABNORMAL HIGH (ref 11.5–15.5)
WBC: 9.4 10*3/uL (ref 4.0–10.5)

## 2013-12-17 LAB — CHROMOSOME ANALYSIS, BONE MARROW

## 2013-12-23 ENCOUNTER — Encounter: Payer: Self-pay | Admitting: Hematology & Oncology

## 2013-12-24 ENCOUNTER — Encounter: Payer: Self-pay | Admitting: Hematology & Oncology

## 2013-12-24 ENCOUNTER — Ambulatory Visit (HOSPITAL_BASED_OUTPATIENT_CLINIC_OR_DEPARTMENT_OTHER): Payer: Medicare Other | Admitting: Hematology & Oncology

## 2013-12-24 ENCOUNTER — Other Ambulatory Visit (HOSPITAL_BASED_OUTPATIENT_CLINIC_OR_DEPARTMENT_OTHER): Payer: Medicare Other | Admitting: Lab

## 2013-12-24 VITALS — BP 129/78 | HR 92 | Temp 98.3°F | Resp 14 | Ht 62.0 in | Wt 191.0 lb

## 2013-12-24 DIAGNOSIS — D473 Essential (hemorrhagic) thrombocythemia: Secondary | ICD-10-CM | POA: Insufficient documentation

## 2013-12-24 DIAGNOSIS — D75839 Thrombocytosis, unspecified: Secondary | ICD-10-CM

## 2013-12-24 DIAGNOSIS — D573 Sickle-cell trait: Secondary | ICD-10-CM

## 2013-12-24 LAB — CBC WITH DIFFERENTIAL (CANCER CENTER ONLY)
BASO#: 0.1 10*3/uL (ref 0.0–0.2)
BASO%: 0.6 % (ref 0.0–2.0)
EOS%: 4.1 % (ref 0.0–7.0)
Eosinophils Absolute: 0.4 10*3/uL (ref 0.0–0.5)
HEMATOCRIT: 36.8 % (ref 34.8–46.6)
HGB: 11.8 g/dL (ref 11.6–15.9)
LYMPH#: 2.2 10*3/uL (ref 0.9–3.3)
LYMPH%: 22 % (ref 14.0–48.0)
MCH: 21.9 pg — AB (ref 26.0–34.0)
MCHC: 32.1 g/dL (ref 32.0–36.0)
MCV: 68 fL — AB (ref 81–101)
MONO#: 0.6 10*3/uL (ref 0.1–0.9)
MONO%: 6.4 % (ref 0.0–13.0)
NEUT#: 6.6 10*3/uL — ABNORMAL HIGH (ref 1.5–6.5)
NEUT%: 66.9 % (ref 39.6–80.0)
PLATELETS: 561 10*3/uL — AB (ref 145–400)
RBC: 5.4 10*6/uL — ABNORMAL HIGH (ref 3.70–5.32)
RDW: 17.5 % — ABNORMAL HIGH (ref 11.1–15.7)
WBC: 9.9 10*3/uL (ref 3.9–10.0)

## 2013-12-24 LAB — LACTATE DEHYDROGENASE: LDH: 301 U/L — AB (ref 94–250)

## 2013-12-24 MED ORDER — HYDROXYUREA 500 MG PO CAPS: 500.0000 mg | ORAL_CAPSULE | Freq: Every day | ORAL | Status: DC

## 2013-12-24 NOTE — Progress Notes (Signed)
This office note has been dictated.

## 2013-12-25 NOTE — Progress Notes (Signed)
DIAGNOSES: 1. Essential thrombocythemia - JAK2 positive. 2. Sickle cell trait.  CURRENT THERAPY:  The patient to start Hydrea 500 mg p.o. b.i.d.  INTERIM HISTORY:  [name] comes in for followup.  We did go ahead and get a bone marrow biopsy on her.  This was done on December 12, 2013. The bone marrow report ([id number]) showed hypercellular marrow with atypical megakaryocytic proliferation.  She had adequate iron.  There was some mild reticulin fibrosis.  There is no collagen.  Again, she is JAK2 positive.  We did do cytogenetics.  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  She is still recovering from her left knee surgery.  She is getting around a little better.  Again, she does have a sickle cell trait.  When we did iron studies back in November, it seemed to suggest low iron state, but her bone marrow showed an increased iron stores.  I think that we have a diagnosis of essential thrombocythemia.  I think the bone marrow was quite consistent with this.  I want to get her started on Hydrea.  PHYSICAL EXAMINATION:  General:  This is a well-developed, well- nourished [place] American female, in no obvious distress.  Vital Signs: Temperature of 98.3, pulse 92, respiratory rate 14, blood pressure 129/78.  Weight is 191 pounds.  Head and Neck:  Normocephalic, atraumatic skull.  There are no ocular or oral lesions.  There are no palpable cervical or supraclavicular lymph nodes.  Lungs:  Clear bilaterally.  Cardiac:  Regular rate and rhythm with a normal S1, S2. There are no murmurs, rubs, or bruits.  Abdomen:  Soft.  She has good bowel sounds.  There is no fluid wave.  There is no palpable abdominal mass.  There is no palpable hepatosplenomegaly.  Back:  No tenderness over the spine, ribs, or hips.  Extremities shows left knee which is slightly swollen from surgery.  She has some decreased range motion of that  knee.  She has slight nonpitting edema of the left lower leg.  Skin exam shows no rashes, ecchymoses, or petechiae.  Neurologic:  No focal neurological deficits.  LABORATORY STUDIES:  White cell count of 9.9, hemoglobin 11.8, hematocrit 36.8, platelet count 561.  MCV is 68.  On peripheral smear, she does have some increased platelets, she has numerous large platelets.  She has a couple of mega platelets.  She has no nucleated red cells.  There may be some anisocytosis and poikilocytosis.  White cells appeared normal in morphology and maturation.  IMPRESSION:  [name] is a nice 68 year old African American female. She has essential thrombocythemia.  We do need to control this.  She does have history of transient ischemic attack.  I think we have to get her on Hydrea.  I realize that the FDA recently approved Jakafi for JAK2-positive myeloproliferative neoplasms.  I do not think we need to go down that road yet.  I think the Hydrea 500 mg p.o. b.i.d. would be appropriate for her.  She is already on baby aspirin.  I spent a good 45 minutes with her.  I reviewed her lab work with her. I reviewed her bone marrow biopsy with her.  I went over Hydrea and the side effects.  She understands these.  We will go ahead and plan to have her come back every 2 weeks for lab work.  I will see her back in 1 month.    ______________________________  [name], M.D. PRE/MEDQ  D:  12/24/2013  T:  12/25/2013  Job:  [id number]

## 2013-12-26 ENCOUNTER — Encounter: Payer: Self-pay | Admitting: Hematology & Oncology

## 2013-12-31 ENCOUNTER — Ambulatory Visit (INDEPENDENT_AMBULATORY_CARE_PROVIDER_SITE_OTHER): Payer: Medicare Other | Admitting: Emergency Medicine

## 2013-12-31 ENCOUNTER — Encounter: Payer: Self-pay | Admitting: Emergency Medicine

## 2013-12-31 VITALS — BP 120/72 | HR 99 | Temp 98.7°F | Ht 62.0 in | Wt 189.0 lb

## 2013-12-31 DIAGNOSIS — J45909 Unspecified asthma, uncomplicated: Secondary | ICD-10-CM

## 2013-12-31 DIAGNOSIS — J383 Other diseases of vocal cords: Secondary | ICD-10-CM

## 2013-12-31 MED ORDER — HYDROCODONE-HOMATROPINE 5-1.5 MG/5ML PO SYRP: 5.0000 mL | ORAL_SOLUTION | Freq: Four times a day (QID) | ORAL | Status: DC | PRN

## 2013-12-31 MED ORDER — PREDNISONE 10 MG PO TABS: ORAL_TABLET | ORAL | Status: DC

## 2013-12-31 NOTE — Assessment & Plan Note (Signed)
With acute flare. Not sure what triggered but clearly UA and severe.  - will treat w pred taper + cough syrup - continue to rx rhinitis and GERD - she will call next week to let us know progress.

## 2013-12-31 NOTE — Assessment & Plan Note (Signed)
-   Continue symbicort bid

## 2013-12-31 NOTE — Patient Instructions (Signed)
Continue your symbicort Take prednisone taper as directed  Use hycodan as needed for cough Continue your other medications as you have been taking them  Follow with Dr Lamonte Sakai in 3 months or sooner if you have any problems.

## 2013-12-31 NOTE — Progress Notes (Signed)
Subjective:    Patient ID: [name], female    DOB: 1946/04/13   MRN: [id number] HPI 68 -year-old woman with a history of asthma and vocal cord dysfunction both of which have been significantly impacted by severe esophageal reflux. Has a hx esophageal stricture dilations by Dr Henrene Pastor. On omeprazole 20mg  two times a day. Uses Symbicort bid.   ROV 06/19/12 -- Hx of VCD and asthma that have been exacerbated by GERD and allergic rhinitis. Formerly on chronic steroids but we successfully weaned to off. She tells me that she was admitted to [facility] for HA and CP, L arm numbness - ECG was reassuring. During that admit she had more trouble w her breathing, treated w short burst pred. Still w some allergic sx, taking fluticasone. Also on omeprazole bid. In general her control is OK - using her SABA about 2x a day.   Acute ov 12/10/12 cc much worse since 1/4/7 was doing well on just symbicort but abuptly worse on 1/4 ? p Ran out of symbicort and prilosec only using q am   with poor hfa was dry cough/ wheeze and sob now with min activity.  Was not using any saba  Then since exac using up to every 4 hours.  ROV 12/20/12 -- Hx of VCD and asthma that have been exacerbated by GERD and allergic rhinitis. She saw Dr Melvyn Novas as above, treated for AE of her VCD with prednisone. She states that she never ran out of symbicort or omeprazole. Has also been seen in ED 2 days ago and rx solumedrol and more pred. No other associated sx or factors. She is having UA wheeze, cough (non-productive). She has stayed on the allegra and nasal steroid. Nothing has made this any better - not prednisone, BD's etc.   ROV 01/28/13 -- Hx of VCD and asthma that have been exacerbated by GERD and allergic rhinitis. She has been flaring since early January. We have been trying to aggressively treat allergies and GERD, weaned prednisone and stopped symbicort temporarily. She returns with improvement in her cough and UA wheeze, but she does have residual  cough. Unfortunately she is having more nasal congestion, ? URI. She is on allegra, fluticasone, omeprazole, [place].   ROV 05/08/13 -- labile VCD and asthma, severe GERD and rhinitis. Returns for f/u. Last time we restarted symbicort (stopped temporarily due to UA irritation). She remains on fluticasone, allegra, [place], omeprazole bid. Has required albuterol nebs more frequently. Having more exertional SOB, sudden wheezing with exertion.   Follow up 07/31/13 Patient returns for a one-week followup. She was seen last week for an acute office visit for asthmatic flare. She was treated with a one-week course of prednisone taper. Patient reports that she is improved, however continues to have intermittent wheezing, and cough. She denies any discolored mucus, or fever, chest pain, orthopnea, PND, or leg swelling. Still has drainage in throat.  ROV 09/11/13 -- labile VCD and asthma, severe GERD and rhinitis. Was seen by TP in 8/14 for an acute flare, treated w pred. Returns today reporting that she is better. She notes that the prednisone gave her a vaginal rash, probably yeast infxn. She is on symbicort, albuterol prn. She is having wheeze in the am and mid-afternoon. Albuterol helps   Acute OV 12/31/13 -- abile VCD and asthma, severe GERD and rhinitis. She presents today with an acute flare > she had been well until yesterday when  She developed more cough, dyspnea, UA noise. Of note she just  started hydrea for her essential thrombocytopenia. She can't identify any discrete trigger that set her off. No Uri sx or sick contacts.   Objective:   Physical Exam Filed Vitals:   12/31/13 1018  BP: 120/72  Pulse: 99  Temp: 98.7 F (37.1 C)  TempSrc: Oral  Height: 5\' 2"  (1.575 m)  Weight: 189 lb (85.73 kg)  SpO2: 97%   Gen: Pleasant, overwt, in no distress,  normal affect,   ENT: No lesions,  mouth clear,  oropharynx clear, no postnasal drip  Neck: supple no JVD, severe insp and exp stridor  Lungs : referred  UA noise   Cardiovascular: RRR, heart sounds normal, no murmur or gallops, no peripheral edema  Musculoskeletal: No deformities, no cyanosis or clubbing  Neuro: alert, non focal  Skin: Warm, no lesions or rashes    Assessment & Plan:     VOCAL CORD DISORDER With acute flare. Not sure what triggered but clearly UA and severe.  - will treat w pred taper + cough syrup - continue to rx rhinitis and GERD - she will call next week to let us know progress.   ASTHMA, MILD - Continue symbicort bid

## 2014-01-06 ENCOUNTER — Other Ambulatory Visit (HOSPITAL_BASED_OUTPATIENT_CLINIC_OR_DEPARTMENT_OTHER): Payer: Medicare Other | Admitting: Lab

## 2014-01-06 DIAGNOSIS — D473 Essential (hemorrhagic) thrombocythemia: Secondary | ICD-10-CM

## 2014-01-06 LAB — CBC WITH DIFFERENTIAL (CANCER CENTER ONLY)
BASO#: 0.1 10*3/uL (ref 0.0–0.2)
BASO%: 0.4 % (ref 0.0–2.0)
EOS%: 0.5 % (ref 0.0–7.0)
Eosinophils Absolute: 0.1 10*3/uL (ref 0.0–0.5)
HCT: 37.5 % (ref 34.8–46.6)
HGB: 12.4 g/dL (ref 11.6–15.9)
LYMPH#: 2.1 10*3/uL (ref 0.9–3.3)
LYMPH%: 15.9 % (ref 14.0–48.0)
MCH: 22.2 pg — ABNORMAL LOW (ref 26.0–34.0)
MCHC: 33.1 g/dL (ref 32.0–36.0)
MCV: 67 fL — ABNORMAL LOW (ref 81–101)
MONO#: 0.5 10*3/uL (ref 0.1–0.9)
MONO%: 3.7 % (ref 0.0–13.0)
NEUT%: 79.5 % (ref 39.6–80.0)
NEUTROS ABS: 10.4 10*3/uL — AB (ref 1.5–6.5)
PLATELETS: 598 10*3/uL — AB (ref 145–400)
RBC: 5.59 10*6/uL — ABNORMAL HIGH (ref 3.70–5.32)
RDW: 18.8 % — AB (ref 11.1–15.7)
WBC: 13.1 10*3/uL — AB (ref 3.9–10.0)

## 2014-01-06 LAB — TECHNOLOGIST REVIEW CHCC SATELLITE

## 2014-01-07 ENCOUNTER — Other Ambulatory Visit: Payer: Medicare Other | Admitting: Lab

## 2014-01-15 ENCOUNTER — Other Ambulatory Visit: Payer: Self-pay | Admitting: Internal Medicine

## 2014-01-20 ENCOUNTER — Telehealth: Payer: Self-pay | Admitting: Hematology & Oncology

## 2014-01-20 NOTE — Telephone Encounter (Signed)
Pt moved 2-17 to 2-27 due to weather

## 2014-01-21 ENCOUNTER — Ambulatory Visit: Payer: Medicare Other | Admitting: Hematology & Oncology

## 2014-01-21 ENCOUNTER — Other Ambulatory Visit: Payer: Medicare Other | Admitting: Lab

## 2014-01-31 ENCOUNTER — Ambulatory Visit (HOSPITAL_BASED_OUTPATIENT_CLINIC_OR_DEPARTMENT_OTHER): Payer: Medicare Other | Admitting: Hematology & Oncology

## 2014-01-31 ENCOUNTER — Encounter: Payer: Self-pay | Admitting: Hematology & Oncology

## 2014-01-31 ENCOUNTER — Other Ambulatory Visit (HOSPITAL_BASED_OUTPATIENT_CLINIC_OR_DEPARTMENT_OTHER): Payer: Medicare Other | Admitting: Lab

## 2014-01-31 ENCOUNTER — Telehealth: Payer: Self-pay | Admitting: Emergency Medicine

## 2014-01-31 VITALS — BP 143/76 | HR 80 | Temp 97.6°F | Resp 14 | Ht 64.0 in | Wt 189.0 lb

## 2014-01-31 DIAGNOSIS — D473 Essential (hemorrhagic) thrombocythemia: Secondary | ICD-10-CM

## 2014-01-31 DIAGNOSIS — Q828 Other specified congenital malformations of skin: Secondary | ICD-10-CM

## 2014-01-31 DIAGNOSIS — D573 Sickle-cell trait: Secondary | ICD-10-CM

## 2014-01-31 LAB — CMP (CANCER CENTER ONLY)
ALK PHOS: 71 U/L (ref 26–84)
ALT(SGPT): 20 U/L (ref 10–47)
AST: 19 U/L (ref 11–38)
Albumin: 3.4 g/dL (ref 3.3–5.5)
BILIRUBIN TOTAL: 0.5 mg/dL (ref 0.20–1.60)
BUN: 12 mg/dL (ref 7–22)
CO2: 28 mEq/L (ref 18–33)
CREATININE: 0.9 mg/dL (ref 0.6–1.2)
Calcium: 9.2 mg/dL (ref 8.0–10.3)
Chloride: 103 mEq/L (ref 98–108)
Glucose, Bld: 80 mg/dL (ref 73–118)
Potassium: 3.9 mEq/L (ref 3.3–4.7)
Sodium: 140 mEq/L (ref 128–145)
Total Protein: 7.3 g/dL (ref 6.4–8.1)

## 2014-01-31 LAB — CBC WITH DIFFERENTIAL (CANCER CENTER ONLY)
BASO#: 0.1 10*3/uL (ref 0.0–0.2)
BASO%: 0.6 % (ref 0.0–2.0)
EOS%: 2.8 % (ref 0.0–7.0)
Eosinophils Absolute: 0.2 10*3/uL (ref 0.0–0.5)
HCT: 39.8 % (ref 34.8–46.6)
HGB: 13 g/dL (ref 11.6–15.9)
LYMPH#: 2.1 10*3/uL (ref 0.9–3.3)
LYMPH%: 24.1 % (ref 14.0–48.0)
MCH: 22.5 pg — AB (ref 26.0–34.0)
MCHC: 32.7 g/dL (ref 32.0–36.0)
MCV: 69 fL — ABNORMAL LOW (ref 81–101)
MONO#: 0.6 10*3/uL (ref 0.1–0.9)
MONO%: 6.9 % (ref 0.0–13.0)
NEUT#: 5.7 10*3/uL (ref 1.5–6.5)
NEUT%: 65.6 % (ref 39.6–80.0)
PLATELETS: 613 10*3/uL — AB (ref 145–400)
RBC: 5.79 10*6/uL — ABNORMAL HIGH (ref 3.70–5.32)
RDW: 21 % — AB (ref 11.1–15.7)
WBC: 8.7 10*3/uL (ref 3.9–10.0)

## 2014-01-31 LAB — LACTATE DEHYDROGENASE: LDH: 301 U/L — AB (ref 94–250)

## 2014-01-31 LAB — CHCC SATELLITE - SMEAR

## 2014-01-31 MED ORDER — BUDESONIDE-FORMOTEROL FUMARATE 160-4.5 MCG/ACT IN AERO: 2.0000 | INHALATION_SPRAY | Freq: Two times a day (BID) | RESPIRATORY_TRACT | Status: DC

## 2014-01-31 MED ORDER — HYDROXYUREA 500 MG PO CAPS: ORAL_CAPSULE | ORAL | Status: DC

## 2014-01-31 NOTE — Progress Notes (Signed)
  DIAGNOSIS:  Essential thrombocythemia-JAK2 positive  Sickle cell trait   CURRENT THERAPY:  Hydrea 500 mg by mouth twice a day-increase up to 1000 mg by mouth twice a day  Folic acid 1 mg by mouth daily   INTERIM HISTORY:  Ms.: [name] comes in for followup. She is doing okay. She has a little nausea with the Hydrea. She says that she's taking it everyday.    She's had no bleeding. She's had no burning in the hands or feet. She's not noted any rashes. She's had no cough or shortness of breath.    Been no change in bowel or bladder habits. She's had no diarrhea.   PHYSICAL EXAMINATION:  Well-developed well-nourished African American female in no obvious distress. Vital signs are temperature of 97.6. Pulse 80. Blood pressure 143/76. Weight is 189 pounds. Head and neck exam shows no ocular or oral lesions. Shows no palpable cervical or supraclavicular lymph nodes. Lungs are clear bilaterally. Cardiac exam regular rate and rhythm with a normal S1 and S2. She has no murmurs rubs or bruits. Abdomen is soft. She is mildly obese. There is no palpable liver or spleen tip. Back exam no tenderness over the spine ribs or hips. Extremities shows no edema in the legs. She has some slight swelling in the left knee where she had surgery. There is no warmth or redness associated with this. There is no erythema on her hands or feet. Neurological exam is unremarkable.   LABORATORY STUDIES:  White cell count 8.7. Hemoglobin 13. Hematocrit 40. Platelet count 613. MCV is 69.    On her blood smear, she has some mild mastocytosis. There are a few target cells. I see no nucleated red cells. White cells per normal in morphology maturation. There are no immature myeloid cells. I don't see much in the way of hypersegmented polys. Platelets are increased in number previous has several large platelets. Platelets were well granulated.   IMPRESSION: Ms. [name] is a 68 year old African American female.  She has essential thrombocythemia. Unfortunately her current dose is not working. Fondest puzzled as to the fact that her MCV is still low. One would think that with Hydrea that the MCV would be a lot higher. I don't think that her having sickle cell trait would keep the MCV low. I don't think she is iron deficient as she's not anemic. Hopefully she is taking her medicine.  I will increase her Hydrea dose. I talked to her about this. We have to get the platelet count down.  We will have her come back for blood work in 3 weeks.  I will see her back in 6 weeks.  I spent a half hour with her. I went over the lab work with her.  [name], MD 01/31/2014

## 2014-01-31 NOTE — Telephone Encounter (Signed)
Pt returned call.  [name] ° °

## 2014-01-31 NOTE — Addendum Note (Signed)
Addended by: Burney Gauze R on: 01/31/2014 12:52 PM   Modules accepted: Orders

## 2014-01-31 NOTE — Telephone Encounter (Signed)
LMTCB

## 2014-01-31 NOTE — Telephone Encounter (Signed)
Pt aware sample left for pick up. Nothing further needed

## 2014-02-03 ENCOUNTER — Telehealth: Payer: Self-pay | Admitting: Hematology & Oncology

## 2014-02-03 NOTE — Telephone Encounter (Signed)
Patient called and cx 03/12/14 apt and resch for 03/26/14

## 2014-02-21 ENCOUNTER — Other Ambulatory Visit (HOSPITAL_BASED_OUTPATIENT_CLINIC_OR_DEPARTMENT_OTHER): Payer: Medicare Other | Admitting: Lab

## 2014-02-21 DIAGNOSIS — D473 Essential (hemorrhagic) thrombocythemia: Secondary | ICD-10-CM

## 2014-02-21 LAB — CBC WITH DIFFERENTIAL (CANCER CENTER ONLY)
BASO#: 0 10*3/uL (ref 0.0–0.2)
BASO%: 0.7 % (ref 0.0–2.0)
EOS ABS: 0.1 10*3/uL (ref 0.0–0.5)
EOS%: 2.1 % (ref 0.0–7.0)
HCT: 35.2 % (ref 34.8–46.6)
HGB: 11.6 g/dL (ref 11.6–15.9)
LYMPH#: 1.6 10*3/uL (ref 0.9–3.3)
LYMPH%: 38.1 % (ref 14.0–48.0)
MCH: 23.8 pg — ABNORMAL LOW (ref 26.0–34.0)
MCHC: 33 g/dL (ref 32.0–36.0)
MCV: 72 fL — ABNORMAL LOW (ref 81–101)
MONO#: 0.3 10*3/uL (ref 0.1–0.9)
MONO%: 6.6 % (ref 0.0–13.0)
NEUT%: 52.5 % (ref 39.6–80.0)
NEUTROS ABS: 2.2 10*3/uL (ref 1.5–6.5)
Platelets: 383 10*3/uL (ref 145–400)
RBC: 4.87 10*6/uL (ref 3.70–5.32)
RDW: 22.2 % — ABNORMAL HIGH (ref 11.1–15.7)
WBC: 4.2 10*3/uL (ref 3.9–10.0)

## 2014-03-12 ENCOUNTER — Ambulatory Visit: Payer: Medicare Other | Admitting: Hematology & Oncology

## 2014-03-12 ENCOUNTER — Other Ambulatory Visit: Payer: Medicare Other | Admitting: Lab

## 2014-03-14 ENCOUNTER — Telehealth: Payer: Self-pay | Admitting: Emergency Medicine

## 2014-03-14 MED ORDER — BUDESONIDE-FORMOTEROL FUMARATE 160-4.5 MCG/ACT IN AERO: 2.0000 | INHALATION_SPRAY | Freq: Two times a day (BID) | RESPIRATORY_TRACT | Status: DC

## 2014-03-14 NOTE — Telephone Encounter (Signed)
Sample left at front. Pt is aware. [name], CMA  

## 2014-03-26 ENCOUNTER — Ambulatory Visit (HOSPITAL_BASED_OUTPATIENT_CLINIC_OR_DEPARTMENT_OTHER): Payer: Medicare Other | Admitting: Hematology & Oncology

## 2014-03-26 ENCOUNTER — Other Ambulatory Visit (HOSPITAL_BASED_OUTPATIENT_CLINIC_OR_DEPARTMENT_OTHER): Payer: Medicare Other | Admitting: Lab

## 2014-03-26 VITALS — BP 134/68 | HR 83 | Resp 14

## 2014-03-26 DIAGNOSIS — D473 Essential (hemorrhagic) thrombocythemia: Secondary | ICD-10-CM

## 2014-03-26 DIAGNOSIS — D573 Sickle-cell trait: Secondary | ICD-10-CM

## 2014-03-26 LAB — CBC WITH DIFFERENTIAL (CANCER CENTER ONLY)
BASO#: 0 10*3/uL (ref 0.0–0.2)
BASO%: 0.6 % (ref 0.0–2.0)
EOS ABS: 0.1 10*3/uL (ref 0.0–0.5)
EOS%: 1.1 % (ref 0.0–7.0)
HEMATOCRIT: 36.1 % (ref 34.8–46.6)
HGB: 12.1 g/dL (ref 11.6–15.9)
LYMPH#: 2.1 10*3/uL (ref 0.9–3.3)
LYMPH%: 29 % (ref 14.0–48.0)
MCH: 25.2 pg — ABNORMAL LOW (ref 26.0–34.0)
MCHC: 33.5 g/dL (ref 32.0–36.0)
MCV: 75 fL — AB (ref 81–101)
MONO#: 0.3 10*3/uL (ref 0.1–0.9)
MONO%: 4.5 % (ref 0.0–13.0)
NEUT#: 4.7 10*3/uL (ref 1.5–6.5)
NEUT%: 64.8 % (ref 39.6–80.0)
Platelets: 422 10*3/uL — ABNORMAL HIGH (ref 145–400)
RBC: 4.8 10*6/uL (ref 3.70–5.32)
RDW: 26.6 % — ABNORMAL HIGH (ref 11.1–15.7)
WBC: 7.3 10*3/uL (ref 3.9–10.0)

## 2014-03-27 NOTE — Progress Notes (Signed)
Hematology and Oncology Follow Up Visit  [name] [id number] 1946-04-15 68 y.o. 03/27/2014   Principle Diagnosis:   Essential thrombocythemia-JAK2 POSITIVE  Sickle cell trait  Current Therapy:    Hydrea 500 mg by mouth twice a day  1 mg by mouth daily     Interim History:  Ms.  [name] is in for followup. We last saw her back in late February.  She's having some weight loss issues. She says she gets nauseated with a higher dose of Hydrea. She was on 1000 mg by mouth twice a day. I will go ahead and cut her back to 500 mg by mouth twice a day.  She's had no bleeding. There's been no diarrhea.  She's had no issues with bleeding or bruising.  She's had no rashes. She's had no headache. There's been no pain in her hands or feet.  Medications: Current outpatient prescriptions:albuterol (PROVENTIL HFA) 108 (90 BASE) MCG/ACT inhaler, Inhale 2 puffs into the lungs every 4 (four) hours as needed for wheezing or shortness of breath. , Disp: , Rfl: ;  albuterol (PROVENTIL) (2.5 MG/3ML) 0.083% nebulizer solution, Take 2.5 mg by nebulization every 4 (four) hours as needed for shortness of breath. For shortness of breath., Disp: , Rfl:  aspirin EC 81 MG tablet, Take 81 mg by mouth daily., Disp: , Rfl: ;  budesonide-formoterol (SYMBICORT) 160-4.5 MCG/ACT inhaler, Inhale 2 puffs into the lungs 2 (two) times daily., Disp: 1 Inhaler, Rfl: 0;  fluticasone (FLONASE) 50 MCG/ACT nasal spray, Place 1 spray into the nose 2 (two) times daily. , Disp: , Rfl:  HYDROcodone-homatropine (HYCODAN) 5-1.5 MG/5ML syrup, Take 5 mLs by mouth every 6 (six) hours as needed for cough., Disp: 240 mL, Rfl: 0;  hydroxyurea (HYDREA) 500 MG capsule, Take 2 pills twice a day with food., Disp: 120 capsule, Rfl: 3;  methocarbamol (ROBAXIN) 500 MG tablet, Take 1,000 mg by mouth 3 (three) times daily., Disp: , Rfl: ;  omeprazole (PRILOSEC) 20 MG capsule, Take 20 mg by mouth 2 (two) times daily before a meal., Disp: , Rfl:   pravastatin (PRAVACHOL) 40 MG tablet, Take 40 mg by mouth daily., Disp: , Rfl: ;  predniSONE (DELTASONE) 10 MG tablet, Take by mouth. Take 40mg  daily for 3 days, then 30mg  daily for 3 days, then 20mg  daily for 3 days, then 10mg  daily for 3 days, then stop, Disp: , Rfl:   Allergies:  Allergies  Allergen Reactions  . Penicillins Itching and Rash    Past Medical History, Surgical history, Social history, and Family History were reviewed and updated.  Review of Systems: As above  Physical Exam:  blood pressure is 134/68 and her pulse is 83. Her respiration is 14.   Well-developed and well-nourished African American female. Lungs are clear. Cardiac exam regular in rhythm. Abdomen is soft there is no palpable liver or spleen tip. Back exam no tenderness over the spine ribs or hips. Lymph node exam shows no palpable lymph nodes. Ocular exam shows no scleral icterus. Extremities shows good range of motion of her joints. No joint swelling is noted. Has good muscle strength in her arms or legs. Skin exam no rashes, ecchymosis or petechia. Neurological exam shows no focal neurological deficits.  Lab Results  Component Value Date   WBC 7.3 03/26/2014   HGB 12.1 03/26/2014   HCT 36.1 03/26/2014   MCV 75* 03/26/2014   PLT 422* 03/26/2014     Chemistry      Component Value Date/Time  NA 140 01/31/2014 1110   NA 139 12/18/2012 1810   K 3.9 01/31/2014 1110   K 3.5 12/18/2012 1810   CL 103 01/31/2014 1110   CL 102 12/18/2012 1810   CO2 28 01/31/2014 1110   CO2 26 12/18/2012 1810   BUN 12 01/31/2014 1110   BUN 10 12/18/2012 1810   CREATININE 0.9 01/31/2014 1110   CREATININE 0.79 12/18/2012 1810      Component Value Date/Time   CALCIUM 9.2 01/31/2014 1110   CALCIUM 9.4 12/18/2012 1810   ALKPHOS 71 01/31/2014 1110   ALKPHOS 77 09/01/2012 1912   AST 19 01/31/2014 1110   AST 19 09/01/2012 1912   ALT 20 01/31/2014 1110   ALT 13 09/01/2012 1912   BILITOT 0.50 01/31/2014 1110   BILITOT 0.2* 09/01/2012 1912          Impression and Plan: [name] is 68 year old African [place] female. She is both thrombocythemia and sickle cell trait.  We have to try to adjust the dose of the Hydrea. This is to try to help her have a little bit better quality of life.  Again we will cut the dose back to 500 mg by mouth twice a day.  She continues a folic acid for her sickle cell trait.  I will plan to see her back in another month or so for followup.   [name], MD 4/23/20156:18 AM

## 2014-04-10 ENCOUNTER — Encounter: Payer: Self-pay | Admitting: Emergency Medicine

## 2014-04-10 ENCOUNTER — Ambulatory Visit (INDEPENDENT_AMBULATORY_CARE_PROVIDER_SITE_OTHER): Payer: Medicare Other | Admitting: Emergency Medicine

## 2014-04-10 VITALS — BP 120/80 | HR 101 | Ht 62.0 in | Wt 188.2 lb

## 2014-04-10 DIAGNOSIS — J45909 Unspecified asthma, uncomplicated: Secondary | ICD-10-CM

## 2014-04-10 DIAGNOSIS — J383 Other diseases of vocal cords: Secondary | ICD-10-CM

## 2014-04-10 DIAGNOSIS — J309 Allergic rhinitis, unspecified: Secondary | ICD-10-CM

## 2014-04-10 NOTE — Assessment & Plan Note (Signed)
-   continue symbicort 

## 2014-04-10 NOTE — Progress Notes (Signed)
Subjective:    Patient ID: [name], female    DOB: 09-21-1946   MRN: [id number] HPI 68 -year-old woman with a history of asthma and vocal cord dysfunction both of which have been significantly impacted by severe esophageal reflux. Has a hx esophageal stricture dilations by Dr Henrene Pastor. On omeprazole 20mg  two times a day. Uses Symbicort bid.   ROV 06/19/12 -- Hx of VCD and asthma that have been exacerbated by GERD and allergic rhinitis. Formerly on chronic steroids but we successfully weaned to off. She tells me that she was admitted to [facility] for HA and CP, L arm numbness - ECG was reassuring. During that admit she had more trouble w her breathing, treated w short burst pred. Still w some allergic sx, taking fluticasone. Also on omeprazole bid. In general her control is OK - using her SABA about 2x a day.   Acute ov 12/10/12 cc much worse since 1/4/7 was doing well on just symbicort but abuptly worse on 1/4 ? p Ran out of symbicort and prilosec only using q am   with poor hfa was dry cough/ wheeze and sob now with min activity.  Was not using any saba  Then since exac using up to every 4 hours.  ROV 12/20/12 -- Hx of VCD and asthma that have been exacerbated by GERD and allergic rhinitis. She saw Dr Melvyn Novas as above, treated for AE of her VCD with prednisone. She states that she never ran out of symbicort or omeprazole. Has also been seen in ED 2 days ago and rx solumedrol and more pred. No other associated sx or factors. She is having UA wheeze, cough (non-productive). She has stayed on the allegra and nasal steroid. Nothing has made this any better - not prednisone, BD's etc.   ROV 01/28/13 -- Hx of VCD and asthma that have been exacerbated by GERD and allergic rhinitis. She has been flaring since early January. We have been trying to aggressively treat allergies and GERD, weaned prednisone and stopped symbicort temporarily. She returns with improvement in her cough and UA wheeze, but she does have residual  cough. Unfortunately she is having more nasal congestion, ? URI. She is on allegra, fluticasone, omeprazole, [place].   ROV 05/08/13 -- labile VCD and asthma, severe GERD and rhinitis. Returns for f/u. Last time we restarted symbicort (stopped temporarily due to UA irritation). She remains on fluticasone, allegra, [place], omeprazole bid. Has required albuterol nebs more frequently. Having more exertional SOB, sudden wheezing with exertion.   Follow up 07/31/13 Patient returns for a one-week followup. She was seen last week for an acute office visit for asthmatic flare. She was treated with a one-week course of prednisone taper. Patient reports that she is improved, however continues to have intermittent wheezing, and cough. She denies any discolored mucus, or fever, chest pain, orthopnea, PND, or leg swelling. Still has drainage in throat.  ROV 09/11/13 -- labile VCD and asthma, severe GERD and rhinitis. Was seen by TP in 8/14 for an acute flare, treated w pred. Returns today reporting that she is better. She notes that the prednisone gave her a vaginal rash, probably yeast infxn. She is on symbicort, albuterol prn. She is having wheeze in the am and mid-afternoon. Albuterol helps   Acute OV 12/31/13 -- labile VCD and asthma, severe GERD and rhinitis. She presents today with an acute flare > she had been well until yesterday when  She developed more cough, dyspnea, UA noise. Of note she just  started hydrea for her essential thrombocytopenia. She can't identify any discrete trigger that set her off. No Uri sx or sick contacts.   ROV 04/10/14 -- f/u for VCD and asthma, GERD, rhinitis. She improved after we treated her for a flare in January, no w better. She has had allergy sx, sneezing, burning eyes. Some wheeze and cough. Taking fluticasone bid.   Objective:   Physical Exam Filed Vitals:   04/10/14 1407  BP: 120/80  Pulse: 101  Height: 5\' 2"  (1.575 m)  Weight: 188 lb 3.2 oz (85.367 kg)  SpO2: 92%   Gen:  Pleasant, overwt, in no distress,  normal affect,   ENT: No lesions,  mouth clear,  oropharynx clear, no postnasal drip  Neck: supple no JVD, severe insp and exp stridor  Lungs : referred UA noise   Cardiovascular: RRR, heart sounds normal, no murmur or gallops, no peripheral edema  Musculoskeletal: No deformities, no cyanosis or clubbing  Neuro: alert, non focal  Skin: Warm, no lesions or rashes    Assessment & Plan:     ALLERGIC RHINITIS Flaring slightly. Will need to get this under control as her VCD is labile.  - add .loratadine to fluticasone - discussed [place] Well controlled at this time. Responded to pred in january  ASTHMA, MILD - continue symbicort

## 2014-04-10 NOTE — Assessment & Plan Note (Signed)
Well controlled at this time. Responded to pred in january

## 2014-04-10 NOTE — Patient Instructions (Signed)
Please continue your Symbicort twice a day Use albuterol when needed Continue your fluticasone nose spray twice a day.  Start loratadine 10mg  (Claritin) once a day.  Consider trying nasal saline washes daily if your allergies are flaring.  Follow with Dr Lamonte Sakai in 4 months or sooner if you have any problems.

## 2014-04-10 NOTE — Assessment & Plan Note (Signed)
Flaring slightly. Will need to get this under control as her VCD is labile.  - add .loratadine to fluticasone - discussed [place]

## 2014-04-25 ENCOUNTER — Other Ambulatory Visit (HOSPITAL_BASED_OUTPATIENT_CLINIC_OR_DEPARTMENT_OTHER): Payer: Medicare Other | Admitting: Lab

## 2014-04-25 ENCOUNTER — Ambulatory Visit (HOSPITAL_BASED_OUTPATIENT_CLINIC_OR_DEPARTMENT_OTHER)
Admission: RE | Admit: 2014-04-25 | Discharge: 2014-04-25 | Disposition: A | Discharge status: Home / Self Care | Payer: Medicare Other | Source: Ambulatory Visit | Attending: Hematology & Oncology | Admitting: Hematology & Oncology

## 2014-04-25 ENCOUNTER — Encounter: Payer: Self-pay | Admitting: Hematology & Oncology

## 2014-04-25 ENCOUNTER — Ambulatory Visit (HOSPITAL_BASED_OUTPATIENT_CLINIC_OR_DEPARTMENT_OTHER): Payer: Medicare Other | Admitting: Hematology & Oncology

## 2014-04-25 VITALS — BP 114/61 | HR 73 | Temp 97.6°F | Resp 14 | Ht 62.0 in | Wt 185.0 lb

## 2014-04-25 DIAGNOSIS — M79609 Pain in unspecified limb: Secondary | ICD-10-CM | POA: Insufficient documentation

## 2014-04-25 DIAGNOSIS — M79671 Pain in right foot: Secondary | ICD-10-CM

## 2014-04-25 DIAGNOSIS — D473 Essential (hemorrhagic) thrombocythemia: Secondary | ICD-10-CM

## 2014-04-25 DIAGNOSIS — D573 Sickle-cell trait: Secondary | ICD-10-CM

## 2014-04-25 LAB — CBC WITH DIFFERENTIAL (CANCER CENTER ONLY)
BASO#: 0.1 10*3/uL (ref 0.0–0.2)
BASO%: 0.6 % (ref 0.0–2.0)
EOS%: 1.7 % (ref 0.0–7.0)
Eosinophils Absolute: 0.1 10*3/uL (ref 0.0–0.5)
HEMATOCRIT: 35.8 % (ref 34.8–46.6)
HGB: 11.9 g/dL (ref 11.6–15.9)
LYMPH#: 1.8 10*3/uL (ref 0.9–3.3)
LYMPH%: 21.8 % (ref 14.0–48.0)
MCH: 26.2 pg (ref 26.0–34.0)
MCHC: 33.2 g/dL (ref 32.0–36.0)
MCV: 79 fL — AB (ref 81–101)
MONO#: 0.8 10*3/uL (ref 0.1–0.9)
MONO%: 9.2 % (ref 0.0–13.0)
NEUT#: 5.5 10*3/uL (ref 1.5–6.5)
NEUT%: 66.7 % (ref 39.6–80.0)
PLATELETS: 563 10*3/uL — AB (ref 145–400)
RBC: 4.54 10*6/uL (ref 3.70–5.32)
RDW: 25.2 % — AB (ref 11.1–15.7)
WBC: 8.2 10*3/uL (ref 3.9–10.0)

## 2014-04-25 LAB — CMP (CANCER CENTER ONLY)
ALBUMIN: 3.4 g/dL (ref 3.3–5.5)
ALK PHOS: 67 U/L (ref 26–84)
ALT(SGPT): 21 U/L (ref 10–47)
AST: 25 U/L (ref 11–38)
BUN, Bld: 11 mg/dL (ref 7–22)
CO2: 29 meq/L (ref 18–33)
Calcium: 9 mg/dL (ref 8.0–10.3)
Chloride: 104 mEq/L (ref 98–108)
Creat: 0.8 mg/dl (ref 0.6–1.2)
GLUCOSE: 86 mg/dL (ref 73–118)
Potassium: 3.6 mEq/L (ref 3.3–4.7)
SODIUM: 143 meq/L (ref 128–145)
TOTAL PROTEIN: 6.8 g/dL (ref 6.4–8.1)
Total Bilirubin: 0.9 mg/dl (ref 0.20–1.60)

## 2014-04-25 LAB — IRON AND TIBC CHCC
%SAT: 29 % (ref 21–57)
Iron: 67 ug/dL (ref 41–142)
TIBC: 233 ug/dL — ABNORMAL LOW (ref 236–444)
UIBC: 166 ug/dL (ref 120–384)

## 2014-04-25 LAB — FERRITIN CHCC: Ferritin: 165 ng/ml (ref 9–269)

## 2014-04-25 LAB — CHCC SATELLITE - SMEAR

## 2014-04-25 LAB — LACTATE DEHYDROGENASE: LDH: 256 U/L — AB (ref 94–250)

## 2014-04-25 MED ORDER — HYDROXYUREA 500 MG PO CAPS: ORAL_CAPSULE | ORAL | Status: DC

## 2014-04-26 NOTE — Progress Notes (Signed)
Hematology and Oncology Follow Up Visit  LEASHA [name] [id number] Apr 24, 1946 68 y.o. 04/26/2014   Principle Diagnosis:   Essential thrombocythemia-JAK2 POSITIVE  Sickle cell trait  Current Therapy:    Hydrea1000 mg by mouth twice a day  Folic acid 1 mg by mouth daily     Interim History:  Ms.  [name] is in for followup. We last saw her back in late April. At that point found, we increased her Hydrea up to 500 mg a day alternating with 1000 mg a day.  She's having problems with her right foot. We did get some x-rays on this. This did not show any problems with fracture. I think she sees a podiatrist for this.  She's had no fever. She's had no cough or shortness of breath. There's been no change in bowel or bladder habits. She's had no bleeding. There's been no diarrhea.  She's had no issues with bleeding or bruising.  She's had no rashes. She's had no headache.  Medications: Current outpatient prescriptions:albuterol (PROVENTIL HFA) 108 (90 BASE) MCG/ACT inhaler, Inhale 2 puffs into the lungs every 4 (four) hours as needed for wheezing or shortness of breath. , Disp: , Rfl: ;  albuterol (PROVENTIL) (2.5 MG/3ML) 0.083% nebulizer solution, Take 2.5 mg by nebulization every 4 (four) hours as needed for shortness of breath. For shortness of breath., Disp: , Rfl:  aspirin EC 81 MG tablet, Take 81 mg by mouth daily., Disp: , Rfl: ;  budesonide-formoterol (SYMBICORT) 160-4.5 MCG/ACT inhaler, Inhale 2 puffs into the lungs 2 (two) times daily., Disp: 1 Inhaler, Rfl: 0;  fluticasone (FLONASE) 50 MCG/ACT nasal spray, Place 1 spray into the nose 2 (two) times daily. , Disp: , Rfl: ;  hydroxyurea (HYDREA) 500 MG capsule, Take 2 pills twice a day with food., Disp: 120 capsule, Rfl: 3 methocarbamol (ROBAXIN) 500 MG tablet, Take 1,000 mg by mouth 3 (three) times daily., Disp: , Rfl: ;  omeprazole (PRILOSEC) 20 MG capsule, Take 20 mg by mouth 2 (two) times daily before a meal., Disp: , Rfl: ;   polyethylene glycol powder (MIRALAX) powder, Take 1 Container by mouth as needed., Disp: , Rfl: ;  pravastatin (PRAVACHOL) 40 MG tablet, Take 40 mg by mouth daily., Disp: , Rfl:  HYDROcodone-homatropine (HYCODAN) 5-1.5 MG/5ML syrup, Take 5 mLs by mouth every 6 (six) hours as needed for cough., Disp: 240 mL, Rfl: 0  Allergies:  Allergies  Allergen Reactions  . Penicillins Itching and Rash    Past Medical History, Surgical history, Social history, and Family History were reviewed and updated.  Review of Systems: As above  Physical Exam:  height is 5\' 2"  (1.575 m) and weight is 185 lb (83.915 kg). Her oral temperature is 97.6 F (36.4 C). Her blood pressure is 114/61 and her pulse is 73. Her respiration is 14.   Well-developed and well-nourished African American female. Lungs are clear. Cardiac exam regular in rhythm. Abdomen is soft there is no palpable liver or spleen tip. Back exam no tenderness over the spine ribs or hips. Lymph node exam shows no palpable lymph nodes. Ocular exam shows no scleral icterus. Extremities shows good range of motion of her joints. No joint swelling is noted. Has good muscle strength in her arms or legs. Skin exam no rashes, ecchymosis or petechia. Neurological exam shows no focal neurological deficits.  Lab Results  Component Value Date   WBC 8.2 04/25/2014   HGB 11.9 04/25/2014   HCT 35.8 04/25/2014   MCV 79* 04/25/2014  PLT 563* 04/25/2014     Chemistry      Component Value Date/Time   NA 143 04/25/2014 0831   NA 139 12/18/2012 1810   K 3.6 04/25/2014 0831   K 3.5 12/18/2012 1810   CL 104 04/25/2014 0831   CL 102 12/18/2012 1810   CO2 29 04/25/2014 0831   CO2 26 12/18/2012 1810   BUN 11 04/25/2014 0831   BUN 10 12/18/2012 1810   CREATININE 0.8 04/25/2014 0831   CREATININE 0.79 12/18/2012 1810      Component Value Date/Time   CALCIUM 9.0 04/25/2014 0831   CALCIUM 9.4 12/18/2012 1810   ALKPHOS 67 04/25/2014 0831   ALKPHOS 77 09/01/2012 1912   AST 25  04/25/2014 0831   AST 19 09/01/2012 1912   ALT 21 04/25/2014 0831   ALT 13 09/01/2012 1912   BILITOT 0.90 04/25/2014 0831   BILITOT 0.2* 09/01/2012 1912      Ferritin is 165. Iron saturation 29%. Total iron 67. LDH 256.  Impression and Plan: [name] is 68 year old African [place] female. She is both thrombocythemia and sickle cell trait.  We have to try to adjust the dose of the Hydrea. This is to try to help her have a little bit better quality of life.  Again we will increase the dose back to 1000 mg by mouth twice a day.  She continues a folic acid for her sickle cell trait.  I will plan to see her back in another month or so for followup.   [name], MD 5/23/20153:00 PM

## 2014-04-29 ENCOUNTER — Telehealth: Payer: Self-pay | Admitting: *Deleted

## 2014-04-29 NOTE — Telephone Encounter (Addendum)
Message copied by Lenn Sink on Tue Apr 29, 2014  1:10 PM ------      Message from: [name]      Created: Sat Apr 26, 2014  9:49 AM       Please call there is no fracture or abnormality with the right foot. Thanks. [name] ------Informed pt that there is no fracture or abnormality with the right foot

## 2014-05-06 ENCOUNTER — Encounter (HOSPITAL_COMMUNITY): Payer: Self-pay

## 2014-05-20 ENCOUNTER — Ambulatory Visit (HOSPITAL_BASED_OUTPATIENT_CLINIC_OR_DEPARTMENT_OTHER): Payer: Medicare Other | Admitting: Hematology & Oncology

## 2014-05-20 ENCOUNTER — Other Ambulatory Visit (HOSPITAL_BASED_OUTPATIENT_CLINIC_OR_DEPARTMENT_OTHER): Payer: Medicare Other | Admitting: Lab

## 2014-05-20 ENCOUNTER — Encounter: Payer: Self-pay | Admitting: Hematology & Oncology

## 2014-05-20 VITALS — BP 125/70 | HR 70 | Temp 98.2°F | Resp 14 | Ht 62.0 in | Wt 186.0 lb

## 2014-05-20 DIAGNOSIS — D473 Essential (hemorrhagic) thrombocythemia: Secondary | ICD-10-CM

## 2014-05-20 DIAGNOSIS — D573 Sickle-cell trait: Secondary | ICD-10-CM

## 2014-05-20 LAB — CBC WITH DIFFERENTIAL (CANCER CENTER ONLY)
BASO#: 0 10*3/uL (ref 0.0–0.2)
BASO%: 0.6 % (ref 0.0–2.0)
EOS%: 1.5 % (ref 0.0–7.0)
Eosinophils Absolute: 0.1 10*3/uL (ref 0.0–0.5)
HCT: 36.5 % (ref 34.8–46.6)
HGB: 12.2 g/dL (ref 11.6–15.9)
LYMPH#: 1.4 10*3/uL (ref 0.9–3.3)
LYMPH%: 29.2 % (ref 14.0–48.0)
MCH: 27.5 pg (ref 26.0–34.0)
MCHC: 33.4 g/dL (ref 32.0–36.0)
MCV: 82 fL (ref 81–101)
MONO#: 0.4 10*3/uL (ref 0.1–0.9)
MONO%: 8 % (ref 0.0–13.0)
NEUT%: 60.7 % (ref 39.6–80.0)
NEUTROS ABS: 2.8 10*3/uL (ref 1.5–6.5)
Platelets: 479 10*3/uL — ABNORMAL HIGH (ref 145–400)
RBC: 4.44 10*6/uL (ref 3.70–5.32)
RDW: 19.4 % — AB (ref 11.1–15.7)
WBC: 4.6 10*3/uL (ref 3.9–10.0)

## 2014-05-20 LAB — IRON AND TIBC CHCC
%SAT: 35 % (ref 21–57)
Iron: 84 ug/dL (ref 41–142)
TIBC: 238 ug/dL (ref 236–444)
UIBC: 154 ug/dL (ref 120–384)

## 2014-05-20 LAB — CHCC SATELLITE - SMEAR

## 2014-05-20 LAB — LACTATE DEHYDROGENASE: LDH: 233 U/L (ref 94–250)

## 2014-05-21 NOTE — Progress Notes (Signed)
Hematology and Oncology Follow Up Visit  [name] [id number] [birth year] 68 y.o. 05/21/2014   Principle Diagnosis:   Essential thrombocythemia-JAK2 POSITIVE  Sickle cell trait  Current Therapy:    Hydrea1000 mg by mouth twice a day  Folic acid 1 mg by mouth daily     Interim History:  Ms.  [name] is in for followup. We last saw her back in May. At that point found, we increased her Hydrea up to 1000 mg by mouth twice a day.  She's not having problems with her right foot at the present time. We did get some x-rays on this. This did not show any problems with fracture. I think she sees a podiatrist for this.  She's had no fever. She's had no cough or shortness of breath. There's been no change in bowel or bladder habits. She's had no bleeding. There's been no diarrhea.  She's had no issues with bleeding or bruising.  She's had no rashes. She's had no headache.  Medications: Current outpatient prescriptions:albuterol (PROVENTIL HFA) 108 (90 BASE) MCG/ACT inhaler, Inhale 2 puffs into the lungs every 4 (four) hours as needed for wheezing or shortness of breath. , Disp: , Rfl: ;  albuterol (PROVENTIL) (2.5 MG/3ML) 0.083% nebulizer solution, Take 2.5 mg by nebulization every 4 (four) hours as needed for shortness of breath. For shortness of breath., Disp: , Rfl:  aspirin EC 81 MG tablet, Take 81 mg by mouth daily., Disp: , Rfl: ;  budesonide-formoterol (SYMBICORT) 160-4.5 MCG/ACT inhaler, Inhale 2 puffs into the lungs 2 (two) times daily., Disp: 1 Inhaler, Rfl: 0;  fluticasone (FLONASE) 50 MCG/ACT nasal spray, Place 1 spray into the nose 2 (two) times daily. , Disp: , Rfl: ;  hydroxyurea (HYDREA) 500 MG capsule, Take 2 pills twice a day with food., Disp: 120 capsule, Rfl: 3 methocarbamol (ROBAXIN) 500 MG tablet, Take 1,000 mg by mouth 3 (three) times daily., Disp: , Rfl: ;  omeprazole (PRILOSEC) 20 MG capsule, Take 20 mg by mouth 2 (two) times daily before a meal., Disp: , Rfl: ;   polyethylene glycol powder (MIRALAX) powder, Take 1 Container by mouth as needed., Disp: , Rfl: ;  pravastatin (PRAVACHOL) 40 MG tablet, Take 40 mg by mouth daily., Disp: , Rfl:  HYDROcodone-homatropine (HYCODAN) 5-1.5 MG/5ML syrup, Take 5 mLs by mouth every 6 (six) hours as needed for cough., Disp: 240 mL, Rfl: 0  Allergies:  Allergies  Allergen Reactions  . Penicillins Itching and Rash    Past Medical History, Surgical history, Social history, and Family History were reviewed and updated.  Review of Systems: As above  Physical Exam:  height is 5\' 2"  (1.575 m) and weight is 186 lb (84.369 kg). Her oral temperature is 98.2 F (36.8 C). Her blood pressure is 125/70 and her pulse is 70. Her respiration is 14.   Well-developed and well-nourished African American female. Lungs are clear. Cardiac exam regular in rhythm. Abdomen is soft there is no palpable liver or spleen tip. Back exam no tenderness over the spine ribs or hips. Lymph node exam shows no palpable lymph nodes. Ocular exam shows no scleral icterus. Extremities shows good range of motion of her joints. No joint swelling is noted. Has good muscle strength in her arms or legs. Skin exam no rashes, ecchymosis or petechia. Neurological exam shows no focal neurological deficits.  Lab Results  Component Value Date   WBC 4.6 05/20/2014   HGB 12.2 05/20/2014   HCT 36.5 05/20/2014   MCV 82  05/20/2014   PLT 479* 05/20/2014     Chemistry      Component Value Date/Time   NA 143 04/25/2014 0831   NA 139 12/18/2012 1810   K 3.6 04/25/2014 0831   K 3.5 12/18/2012 1810   CL 104 04/25/2014 0831   CL 102 12/18/2012 1810   CO2 29 04/25/2014 0831   CO2 26 12/18/2012 1810   BUN 11 04/25/2014 0831   BUN 10 12/18/2012 1810   CREATININE 0.8 04/25/2014 0831   CREATININE 0.79 12/18/2012 1810      Component Value Date/Time   CALCIUM 9.0 04/25/2014 0831   CALCIUM 9.4 12/18/2012 1810   ALKPHOS 67 04/25/2014 0831   ALKPHOS 77 09/01/2012 1912   AST 25 04/25/2014  0831   AST 19 09/01/2012 1912   ALT 21 04/25/2014 0831   ALT 13 09/01/2012 1912   BILITOT 0.90 04/25/2014 0831   BILITOT 0.2* 09/01/2012 1912      Ferritin is 165. Iron saturation 35 %. Total iron 84. LDH 233.  Impression and Plan: [name] is 68 year old African [place] female. She is both thrombocythemia and sickle cell trait.  The increased dose of Hydrea is. Her platelet count down nicely. She is not anemic. Hopefully, we will still see a nice response.. This is to try to help her have a little bit better quality of life.  She continues a folic acid for her sickle cell trait.  I will plan to see her back in another 4-6 weeks or so for followup.   [name], MD 6/17/20156:24 PM

## 2014-05-28 ENCOUNTER — Other Ambulatory Visit: Payer: Self-pay | Admitting: Emergency Medicine

## 2014-06-16 ENCOUNTER — Telehealth: Payer: Self-pay | Admitting: Emergency Medicine

## 2014-06-16 MED ORDER — BUDESONIDE-FORMOTEROL FUMARATE 160-4.5 MCG/ACT IN AERO: 2.0000 | INHALATION_SPRAY | Freq: Two times a day (BID) | RESPIRATORY_TRACT | Status: DC

## 2014-06-16 NOTE — Telephone Encounter (Signed)
Left pt detailed message that sample of symbicort would be at front desk for pick and that new rx for symbicort sent to [facility]. Nothing further needed

## 2014-07-07 ENCOUNTER — Other Ambulatory Visit: Payer: Self-pay | Admitting: Emergency Medicine

## 2014-07-21 ENCOUNTER — Encounter: Payer: Self-pay | Admitting: Family

## 2014-07-21 ENCOUNTER — Other Ambulatory Visit (HOSPITAL_BASED_OUTPATIENT_CLINIC_OR_DEPARTMENT_OTHER): Payer: Medicare Other | Admitting: Lab

## 2014-07-21 ENCOUNTER — Other Ambulatory Visit: Payer: Self-pay | Admitting: Family

## 2014-07-21 ENCOUNTER — Ambulatory Visit (HOSPITAL_BASED_OUTPATIENT_CLINIC_OR_DEPARTMENT_OTHER): Payer: Medicare Other | Admitting: Family

## 2014-07-21 VITALS — BP 130/71 | HR 80 | Temp 97.4°F | Resp 14 | Ht 62.0 in | Wt 181.0 lb

## 2014-07-21 DIAGNOSIS — D693 Immune thrombocytopenic purpura: Secondary | ICD-10-CM

## 2014-07-21 DIAGNOSIS — D473 Essential (hemorrhagic) thrombocythemia: Secondary | ICD-10-CM

## 2014-07-21 LAB — CBC WITH DIFFERENTIAL (CANCER CENTER ONLY)
BASO#: 0 10*3/uL (ref 0.0–0.2)
BASO%: 0.4 % (ref 0.0–2.0)
EOS ABS: 0.1 10*3/uL (ref 0.0–0.5)
EOS%: 1.3 % (ref 0.0–7.0)
HCT: 37.4 % (ref 34.8–46.6)
HEMOGLOBIN: 12.5 g/dL (ref 11.6–15.9)
LYMPH#: 1.3 10*3/uL (ref 0.9–3.3)
LYMPH%: 28.5 % (ref 14.0–48.0)
MCH: 28.1 pg (ref 26.0–34.0)
MCHC: 33.4 g/dL (ref 32.0–36.0)
MCV: 84 fL (ref 81–101)
MONO#: 0.3 10*3/uL (ref 0.1–0.9)
MONO%: 5.3 % (ref 0.0–13.0)
NEUT#: 3 10*3/uL (ref 1.5–6.5)
NEUT%: 64.5 % (ref 39.6–80.0)
Platelets: 384 10*3/uL (ref 145–400)
RBC: 4.45 10*6/uL (ref 3.70–5.32)
RDW: 19.3 % — ABNORMAL HIGH (ref 11.1–15.7)
WBC: 4.7 10*3/uL (ref 3.9–10.0)

## 2014-07-21 LAB — CMP (CANCER CENTER ONLY)
ALK PHOS: 63 U/L (ref 26–84)
ALT: 16 U/L (ref 10–47)
AST: 15 U/L (ref 11–38)
Albumin: 3.5 g/dL (ref 3.3–5.5)
BILIRUBIN TOTAL: 0.6 mg/dL (ref 0.20–1.60)
BUN, Bld: 15 mg/dL (ref 7–22)
CO2: 28 meq/L (ref 18–33)
CREATININE: 0.7 mg/dL (ref 0.6–1.2)
Calcium: 9.1 mg/dL (ref 8.0–10.3)
Chloride: 104 mEq/L (ref 98–108)
Glucose, Bld: 98 mg/dL (ref 73–118)
Potassium: 3.8 mEq/L (ref 3.3–4.7)
SODIUM: 144 meq/L (ref 128–145)
TOTAL PROTEIN: 7.2 g/dL (ref 6.4–8.1)

## 2014-07-21 LAB — IRON AND TIBC CHCC
%SAT: 35 % (ref 21–57)
IRON: 86 ug/dL (ref 41–142)
TIBC: 245 ug/dL (ref 236–444)
UIBC: 159 ug/dL (ref 120–384)

## 2014-07-21 LAB — CHCC SATELLITE - SMEAR

## 2014-07-21 LAB — FERRITIN CHCC: Ferritin: 160 ng/ml (ref 9–269)

## 2014-07-21 MED ORDER — HYDROXYUREA 500 MG PO CAPS: ORAL_CAPSULE | ORAL | Status: DC

## 2014-07-21 NOTE — Progress Notes (Signed)
[place]  [phone] [phone]  ID: [name] OB: 12-Sep-1946 MR#: [id number]#:[id number] Patient Care Team: Merrilee Seashore, MD as PCP - General (Internal Medicine)  DIAGNOSIS: Essential thrombocythemia-JAK2 POSITIVE  Sickle cell trait  INTERVAL HISTORY: [name] is here today for follow-up. She is doing ok. She is tired today. She states that her husband had a heart attack last week and he just came him so she hasn't had much rest. Her right foot is feeling much better. She denies fever, chills, rash, cough, headache, dizziness, SOB, chest pain, palpitations, abdominal pain, constipation, diarrhea, blood in urine or stool. She has had no bleeding or pain. She denies swelling, tenderness, numbness or tingling in her extremities. Her appetite is good and she is drinking plenty of fluids.   CURRENT TREATMENT: Hydrea1000 mg by mouth twice a day  Folic acid 1 mg by mouth daily  REVIEW OF SYSTEMS: All other 10 point review of systems is negative.   PAST MEDICAL HISTORY: Past Medical History  Diagnosis Date  . Allergic rhinitis   . Asthma   . GERD (gastroesophageal reflux disease)   . Hyperlipidemia   . Vocal cord dysfunction   . Esophageal stricture   . Colonic polyp   . Osteoporosis   . DJD (degenerative joint disease) of knee     bilateral  . Hiatal hernia   . DDD (degenerative disc disease), lumbar 07/20/2011  . DJD (degenerative joint disease), lumbar 07/20/2011  . Diabetes mellitus, type 2     does not take medication; pt denies  . Essential thrombocythemia 12/24/2013   PAST SURGICAL HISTORY: Past Surgical History  Procedure Laterality Date  . Carpal tunnel release      bilateral  . Total abdominal hysterectomy    . Foot surgery      bilateral  . Knee athroscopy  Dec.3, 2014    left knee   FAMILY HISTORY Family History  Problem Relation Age of Onset  . Cancer Sister   . Heart attack Mother   . Asthma Brother   .  Asthma      uncle  . Colon cancer Father 93  . Stomach cancer Neg Hx   . Esophageal cancer Neg Hx   . Rectal cancer Neg Hx    GYNECOLOGIC HISTORY:  No LMP recorded. Patient has had a hysterectomy.   SOCIAL HISTORY:  History   Social History  . Marital Status: Married    Spouse Name: [name]    Number of Children: [name]  . Years of Education: [name]   Occupational History  . retired    Social History Main Topics  . Smoking status: Former Smoker -- 1.00 packs/day for 20 years    Types: Cigarettes    Start date: 01/31/1975    Quit date: 08/09/1995  . Smokeless tobacco: Never Used     Comment: quit 18 years ago.  . Alcohol Use: No  . Drug Use: No  . Sexual Activity: Not on file   Other Topics Concern  . Not on file   Social History Narrative  . No narrative on file   ADVANCED DIRECTIVES: <no information>  HEALTH MAINTENANCE: History  Substance Use Topics  . Smoking status: Former Smoker -- 1.00 packs/day for 20 years    Types: Cigarettes    Start date: 01/31/1975    Quit date: 08/09/1995  . Smokeless tobacco: Never Used     Comment: quit 18 years ago.  . Alcohol Use: No   Colonoscopy:  PAP: Bone density: Lipid panel:  Allergies  Allergen Reactions  . Penicillins Itching and Rash    Current Outpatient Prescriptions  Medication Sig Dispense Refill  . albuterol (PROVENTIL HFA) 108 (90 BASE) MCG/ACT inhaler Inhale 2 puffs into the lungs every 4 (four) hours as needed for wheezing or shortness of breath.       Marland Kitchen albuterol (PROVENTIL) (2.5 MG/3ML) 0.083% nebulizer solution Take 2.5 mg by nebulization every 4 (four) hours as needed for shortness of breath. For shortness of breath.      Marland Kitchen aspirin EC 81 MG tablet Take 81 mg by mouth daily.      . budesonide-formoterol (SYMBICORT) 160-4.5 MCG/ACT inhaler Inhale 2 puffs into the lungs 2 (two) times daily.  1 Inhaler  6  . fluticasone (FLONASE) 50 MCG/ACT nasal spray Place 1 spray into the nose 2 (two) times daily.       Marland Kitchen  HYDROcodone-homatropine (HYCODAN) 5-1.5 MG/5ML syrup Take 5 mLs by mouth every 6 (six) hours as needed for cough.  240 mL  0  . hydroxyurea (HYDREA) 500 MG capsule Take 2 pills twice a day with food.  120 capsule  3  . methocarbamol (ROBAXIN) 500 MG tablet Take 1,000 mg by mouth 3 (three) times daily.      Marland Kitchen omeprazole (PRILOSEC) 20 MG capsule Take 20 mg by mouth 2 (two) times daily before a meal.      . polyethylene glycol powder (MIRALAX) powder Take 1 Container by mouth as needed.      . pravastatin (PRAVACHOL) 40 MG tablet Take 40 mg by mouth daily.       No current facility-administered medications for this visit.   OBJECTIVE: Filed Vitals:   07/21/14 1029  BP: 130/71  Pulse: 80  Temp: 97.4 F (36.3 C)  Resp: 14   Body mass index is 33.1 kg/(m^2). ECOG FS:0 - Asymptomatic Ocular: Sclerae unicteric, pupils equal, round and reactive to light Ear-nose-throat: Oropharynx clear, dentition fair Lymphatic: No cervical or supraclavicular adenopathy Lungs no rales or rhonchi, good excursion bilaterally Heart regular rate and rhythm, no murmur appreciated Abd soft, nontender, positive bowel sounds MSK no focal spinal tenderness, no joint edema Neuro: non-focal, well-oriented, appropriate affect Breasts: Deferred  LAB RESULTS: CMP     Component Value Date/Time   NA 144 07/21/2014 0940   NA 139 12/18/2012 1810   K 3.8 07/21/2014 0940   K 3.5 12/18/2012 1810   CL 104 07/21/2014 0940   CL 102 12/18/2012 1810   CO2 28 07/21/2014 0940   CO2 26 12/18/2012 1810   GLUCOSE 98 07/21/2014 0940   GLUCOSE 92 12/18/2012 1810   BUN 15 07/21/2014 0940   BUN 10 12/18/2012 1810   CREATININE 0.7 07/21/2014 0940   CREATININE 0.79 12/18/2012 1810   CALCIUM 9.1 07/21/2014 0940   CALCIUM 9.4 12/18/2012 1810   PROT 7.2 07/21/2014 0940   PROT 6.7 09/01/2012 1912   ALBUMIN 3.3* 09/01/2012 1912   AST 15 07/21/2014 0940   AST 19 09/01/2012 1912   ALT 16 07/21/2014 0940   ALT 13 09/01/2012 1912   ALKPHOS 63 07/21/2014  0940   ALKPHOS 77 09/01/2012 1912   BILITOT 0.60 07/21/2014 0940   BILITOT 0.2* 09/01/2012 1912   GFRNONAA 84* 12/18/2012 1810   GFRAA >90 12/18/2012 1810   No results found for this basename: SPEP, UPEP,  kappa and lambda light chains   Lab Results  Component Value Date   WBC 4.7 07/21/2014   NEUTROABS 3.0  07/21/2014   HGB 12.5 07/21/2014   HCT 37.4 07/21/2014   MCV 84 07/21/2014   PLT 384 07/21/2014   No results found for this basename: LABCA2   No components found with this basename: [id number]   No results found for this basename: INR,  in the last 168 hours  STUDIES: No results found.  ASSESSMENT/PLAN: Ms. Pfannenstiel is 68 year old African [place] female with both thrombocythemia and sickle cell trait.  Her platelets are coming down very nicely. We will reduce her Hydrea dose to 1000 mg PO once daily. Hopefully, we will continue to see a nice response. She is doing well with this.  She will continue taking the folic acid for her sickle cell trait.  We will see her back in 6 weeks for labs and follow-up.  She is in agreement with this and knows to call here with any questions or concerns. We can certainly see her back here sooner if need be.   Eliezer Bottom, NP 07/21/2014 10:51 AM

## 2014-08-04 ENCOUNTER — Telehealth: Payer: Self-pay | Admitting: Emergency Medicine

## 2014-08-04 MED ORDER — BUDESONIDE-FORMOTEROL FUMARATE 160-4.5 MCG/ACT IN AERO: 2.0000 | INHALATION_SPRAY | Freq: Two times a day (BID) | RESPIRATORY_TRACT | Status: DC

## 2014-08-04 NOTE — Telephone Encounter (Signed)
Called and spoke with pt and she is aware of sample up front and she stated that her daughter will pick this up for her.  Nothing further is needed.

## 2014-08-08 ENCOUNTER — Encounter: Payer: Self-pay | Admitting: Emergency Medicine

## 2014-08-08 ENCOUNTER — Ambulatory Visit (INDEPENDENT_AMBULATORY_CARE_PROVIDER_SITE_OTHER): Payer: Medicare Other | Admitting: Emergency Medicine

## 2014-08-08 VITALS — BP 132/70 | HR 68 | Ht 62.0 in | Wt 185.0 lb

## 2014-08-08 DIAGNOSIS — Z23 Encounter for immunization: Secondary | ICD-10-CM

## 2014-08-08 DIAGNOSIS — J45901 Unspecified asthma with (acute) exacerbation: Secondary | ICD-10-CM

## 2014-08-08 MED ORDER — PREDNISONE 10 MG PO TABS: ORAL_TABLET | ORAL | Status: DC

## 2014-08-08 MED ORDER — BUDESONIDE-FORMOTEROL FUMARATE 160-4.5 MCG/ACT IN AERO
2.0000 | INHALATION_SPRAY | Freq: Two times a day (BID) | RESPIRATORY_TRACT | Status: DC
Start: 2014-08-08 — End: 2017-02-03

## 2014-08-08 MED ORDER — LEVALBUTEROL HCL 0.63 MG/3ML IN NEBU
0.6300 mg | INHALATION_SOLUTION | Freq: Once | RESPIRATORY_TRACT | Status: AC
  Administered 2014-08-08: 0.63 mg via RESPIRATORY_TRACT

## 2014-08-08 NOTE — Addendum Note (Signed)
Addended by: Parke Poisson E on: 08/08/2014 12:18 PM   Modules accepted: Orders

## 2014-08-08 NOTE — Patient Instructions (Signed)
Prednisone taper over next week .  Please continue your Symbicort twice a day, rinse after use.  Use albuterol when needed Continue your fluticasone nose spray twice a day.  Use loratadine 10mg  (Claritin) once a day.  Follow with Dr Lamonte Sakai in 4-6  months or sooner if you have any problems. Flu shot today

## 2014-08-08 NOTE — Assessment & Plan Note (Addendum)
Mild flare with AR c/by VCD Continue to control for triggers of upper airway irritation .  Will plan on prevnar vaccine on return  xopenex neb in office x 1   Plan  Prednisone taper over next week .  Please continue your Symbicort twice a day, rinse after use.  Use albuterol when needed Continue your fluticasone nose spray twice a day.  Use loratadine 10mg  (Claritin) once a day.  Follow with Dr Lamonte Sakai in 4-6  months or sooner if you have any problems. Flu shot today

## 2014-08-08 NOTE — Addendum Note (Signed)
Addended by: Parke Poisson E on: 08/08/2014 11:21 AM   Modules accepted: Orders

## 2014-08-08 NOTE — Progress Notes (Signed)
Subjective:    Patient ID: [name], female    DOB: 09-21-1946   MRN: [id number] HPI 68 -year-old woman with a history of asthma and vocal cord dysfunction both of which have been significantly impacted by severe esophageal reflux. Has a hx esophageal stricture dilations by Dr Henrene Pastor. On omeprazole 20mg  two times a day. Uses Symbicort bid.   ROV 06/19/12 -- Hx of VCD and asthma that have been exacerbated by GERD and allergic rhinitis. Formerly on chronic steroids but we successfully weaned to off. She tells me that she was admitted to [facility] for HA and CP, L arm numbness - ECG was reassuring. During that admit she had more trouble w her breathing, treated w short burst pred. Still w some allergic sx, taking fluticasone. Also on omeprazole bid. In general her control is OK - using her SABA about 2x a day.   Acute ov 12/10/12 cc much worse since 1/4/7 was doing well on just symbicort but abuptly worse on 1/4 ? p Ran out of symbicort and prilosec only using q am   with poor hfa was dry cough/ wheeze and sob now with min activity.  Was not using any saba  Then since exac using up to every 4 hours.  ROV 12/20/12 -- Hx of VCD and asthma that have been exacerbated by GERD and allergic rhinitis. She saw Dr Melvyn Novas as above, treated for AE of her VCD with prednisone. She states that she never ran out of symbicort or omeprazole. Has also been seen in ED 2 days ago and rx solumedrol and more pred. No other associated sx or factors. She is having UA wheeze, cough (non-productive). She has stayed on the allegra and nasal steroid. Nothing has made this any better - not prednisone, BD's etc.   ROV 01/28/13 -- Hx of VCD and asthma that have been exacerbated by GERD and allergic rhinitis. She has been flaring since early January. We have been trying to aggressively treat allergies and GERD, weaned prednisone and stopped symbicort temporarily. She returns with improvement in her cough and UA wheeze, but she does have residual  cough. Unfortunately she is having more nasal congestion, ? URI. She is on allegra, fluticasone, omeprazole, [place].   ROV 05/08/13 -- labile VCD and asthma, severe GERD and rhinitis. Returns for f/u. Last time we restarted symbicort (stopped temporarily due to UA irritation). She remains on fluticasone, allegra, [place], omeprazole bid. Has required albuterol nebs more frequently. Having more exertional SOB, sudden wheezing with exertion.   Follow up 07/31/13 Patient returns for a one-week followup. She was seen last week for an acute office visit for asthmatic flare. She was treated with a one-week course of prednisone taper. Patient reports that she is improved, however continues to have intermittent wheezing, and cough. She denies any discolored mucus, or fever, chest pain, orthopnea, PND, or leg swelling. Still has drainage in throat.  ROV 09/11/13 -- labile VCD and asthma, severe GERD and rhinitis. Was seen by TP in 8/14 for an acute flare, treated w pred. Returns today reporting that she is better. She notes that the prednisone gave her a vaginal rash, probably yeast infxn. She is on symbicort, albuterol prn. She is having wheeze in the am and mid-afternoon. Albuterol helps   Acute OV 12/31/13 -- labile VCD and asthma, severe GERD and rhinitis. She presents today with an acute flare > she had been well until yesterday when  She developed more cough, dyspnea, UA noise. Of note she just  started hydrea for her essential thrombocytopenia. She can't identify any discrete trigger that set her off. No Uri sx or sick contacts.   ROV 04/10/14 -- f/u for VCD and asthma, GERD, rhinitis. She improved after we treated her for a flare in January, no w better. She has had allergy sx, sneezing, burning eyes. Some wheeze and cough. Taking fluticasone bid.   08/08/2014 Follow Up VCD/asthma/GERD /AR Pt complains of  increased SOB, nonprod cough X2 weeks. Some nasal drainage  No fever, discolored mucus, edema or hemoptysis. No  recent travel or abx use.  Doing well until last couple of weeks.  Interested in [place].  We discussed flu shot .  Husband died 2 weeks ago from MI , under some stress .  Needs refill of symbicort .    ROS:  Constitutional:   No  weight loss, night sweats,  Fevers, chills, + fatigue, or  lassitude.  HEENT:   No headaches,  Difficulty swallowing,  Tooth/dental problems, or  Sore throat,                No sneezing, itching, ear ache,  +nasal congestion, post nasal drip,   CV:  No chest pain,  Orthopnea, PND, swelling in lower extremities, anasarca, dizziness, palpitations, syncope.   GI  No heartburn, indigestion, abdominal pain, nausea, vomiting, diarrhea, change in bowel habits, loss of appetite, bloody stools.   Resp:   No chest wall deformity  Skin: no rash or lesions.  GU: no dysuria, change in color of urine, no urgency or frequency.  No flank pain, no hematuria   MS:  No joint pain or swelling.  No decreased range of motion.  No back pain.  Psych:  No change in mood or affect. No depression or anxiety.  No memory loss.          Objective:   Physical Exam Filed Vitals:   08/08/14 1052  BP: 132/70  Pulse: 68  Height: 5\' 2"  (1.575 m)  Weight: 185 lb (83.915 kg)  SpO2: 100%   Gen: Pleasant, overwt, in no distress,  normal affect,   ENT: No lesions,  mouth clear,  oropharynx clear, no postnasal drip  Neck: supple no JVD, no stridor   Lungs : few exp wheezes , some upper airway psuedowheezing   Cardiovascular: RRR, heart sounds normal, no murmur or gallops, no peripheral edema  Musculoskeletal: No deformities, no cyanosis or clubbing  Neuro: alert, non focal  Skin: Warm, no lesions or rashes    Assessment & Plan:     Asthma with acute exacerbation Mild flare with AR c/by VCD Continue to control for triggers of upper airway irritation .  Will plan on prevnar vaccine on return  xopenex neb in office x 1   Plan  Prednisone taper over next week .   Please continue your Symbicort twice a day, rinse after use.  Use albuterol when needed Continue your fluticasone nose spray twice a day.  Use loratadine 10mg  (Claritin) once a day.  Follow with Dr Lamonte Sakai in 4-6  months or sooner if you have any problems. Flu shot today     I have seen and examined [name], discussed with T. Parrett. She is having a mild flare, agree that she will benefit from prednisone. Will follow with her to insure improvement. Continue her other meds.   Baltazar Apo, MD, PhD 08/08/2014, 11:15 AM [facility] [phone] or if no answer [phone]

## 2014-08-12 ENCOUNTER — Telehealth: Payer: Self-pay | Admitting: Emergency Medicine

## 2014-08-12 MED ORDER — PREDNISONE 10 MG PO TABS: ORAL_TABLET | ORAL | Status: DC

## 2014-08-12 NOTE — Telephone Encounter (Signed)
Called spoke with pt. The pred taper states it was phone in from 9/41/5. I have re sent this in. Pt reports she is not able to afford the symbicort inhaler. Requesting alternatives. Please advise RB thanks

## 2014-08-15 NOTE — Telephone Encounter (Signed)
The alternatives are all similar in price unfortunately

## 2014-08-15 NOTE — Telephone Encounter (Signed)
lmtcb x1 

## 2014-08-18 NOTE — Telephone Encounter (Signed)
Called made pt aware. She will call her insurance to see what is on her formulary. Nothing further needed

## 2014-08-18 NOTE — Telephone Encounter (Signed)
[phone] returning a call

## 2014-08-18 NOTE — Telephone Encounter (Signed)
lmomtcb x 2  

## 2014-09-01 ENCOUNTER — Other Ambulatory Visit (HOSPITAL_BASED_OUTPATIENT_CLINIC_OR_DEPARTMENT_OTHER): Payer: Medicare Other | Admitting: Lab

## 2014-09-01 ENCOUNTER — Encounter: Payer: Self-pay | Admitting: Hematology & Oncology

## 2014-09-01 ENCOUNTER — Ambulatory Visit (HOSPITAL_BASED_OUTPATIENT_CLINIC_OR_DEPARTMENT_OTHER): Payer: Medicare Other | Admitting: Hematology & Oncology

## 2014-09-01 VITALS — BP 139/69 | HR 82 | Temp 97.5°F | Resp 14 | Ht 61.0 in | Wt 188.0 lb

## 2014-09-01 DIAGNOSIS — D473 Essential (hemorrhagic) thrombocythemia: Secondary | ICD-10-CM

## 2014-09-01 DIAGNOSIS — D573 Sickle-cell trait: Secondary | ICD-10-CM

## 2014-09-01 LAB — COMPREHENSIVE METABOLIC PANEL
ALT: 10 U/L (ref 0–35)
AST: 9 U/L (ref 0–37)
Albumin: 3.8 g/dL (ref 3.5–5.2)
Alkaline Phosphatase: 74 U/L (ref 39–117)
BILIRUBIN TOTAL: 0.3 mg/dL (ref 0.2–1.2)
BUN: 11 mg/dL (ref 6–23)
CO2: 24 mEq/L (ref 19–32)
CREATININE: 0.82 mg/dL (ref 0.50–1.10)
Calcium: 9.1 mg/dL (ref 8.4–10.5)
Chloride: 104 mEq/L (ref 96–112)
Glucose, Bld: 93 mg/dL (ref 70–99)
Potassium: 4.2 mEq/L (ref 3.5–5.3)
SODIUM: 140 meq/L (ref 135–145)
Total Protein: 6.2 g/dL (ref 6.0–8.3)

## 2014-09-01 LAB — CBC WITH DIFFERENTIAL (CANCER CENTER ONLY)
BASO#: 0 10*3/uL (ref 0.0–0.2)
BASO%: 0.4 % (ref 0.0–2.0)
EOS%: 1.4 % (ref 0.0–7.0)
Eosinophils Absolute: 0.1 10*3/uL (ref 0.0–0.5)
HCT: 38.8 % (ref 34.8–46.6)
HGB: 12.8 g/dL (ref 11.6–15.9)
LYMPH#: 3 10*3/uL (ref 0.9–3.3)
LYMPH%: 30.1 % (ref 14.0–48.0)
MCH: 27.5 pg (ref 26.0–34.0)
MCHC: 33 g/dL (ref 32.0–36.0)
MCV: 83 fL (ref 81–101)
MONO#: 0.7 10*3/uL (ref 0.1–0.9)
MONO%: 7.4 % (ref 0.0–13.0)
NEUT%: 60.7 % (ref 39.6–80.0)
NEUTROS ABS: 6 10*3/uL (ref 1.5–6.5)
PLATELETS: 495 10*3/uL — AB (ref 145–400)
RBC: 4.65 10*6/uL (ref 3.70–5.32)
RDW: 17.5 % — ABNORMAL HIGH (ref 11.1–15.7)
WBC: 9.9 10*3/uL (ref 3.9–10.0)

## 2014-09-01 LAB — IRON AND TIBC CHCC
%SAT: 32 % (ref 21–57)
Iron: 76 ug/dL (ref 41–142)
TIBC: 240 ug/dL (ref 236–444)
UIBC: 164 ug/dL (ref 120–384)

## 2014-09-01 LAB — TECHNOLOGIST REVIEW CHCC SATELLITE: Tech Review: 1

## 2014-09-01 LAB — FERRITIN CHCC: Ferritin: 68 ng/ml (ref 9–269)

## 2014-09-01 LAB — CHCC SATELLITE - SMEAR

## 2014-09-01 NOTE — Progress Notes (Signed)
Hematology and Oncology Follow Up Visit  [name] [id number] December 03, 1946 68 y.o. 09/01/2014   Principle Diagnosis:   Essential thrombocythemia-JAK2 POSITIVE  Sickle cell trait  Current Therapy:    Hydrea 500mg  by mouth once  a day  Folic acid 1 mg by mouth daily     Interim History:  [name] is back for followup. She is doing okay although her husband died about a month ago. They have been married 7 years. Get a heart attack. He had no obvious heart disease.  She will be going to [place] to be a family for a few weeks. She'll be going in about 2 weeks.  She's doing well otherwise. She's had no problems with nausea vomiting. She had no cough. She is she's had no shortness of breath. She's had no headache. She's had no bleeding. She's had no nausea or vomiting. She's had no leg swelling or rashes. She's had no further problems. Medications: Current outpatient prescriptions:albuterol (PROVENTIL HFA) 108 (90 BASE) MCG/ACT inhaler, Inhale 2 puffs into the lungs every 4 (four) hours as needed for wheezing or shortness of breath. , Disp: , Rfl: ;  albuterol (PROVENTIL) (2.5 MG/3ML) 0.083% nebulizer solution, Take 2.5 mg by nebulization every 4 (four) hours as needed for shortness of breath. For shortness of breath., Disp: , Rfl:  aspirin EC 81 MG tablet, Take 81 mg by mouth daily., Disp: , Rfl: ;  budesonide-formoterol (SYMBICORT) 160-4.5 MCG/ACT inhaler, Inhale 2 puffs into the lungs 2 (two) times daily., Disp: 10.2 g, Rfl: 5;  fluticasone (FLONASE) 50 MCG/ACT nasal spray, Place 1 spray into the nose 2 (two) times daily. , Disp: , Rfl: ;  hydroxyurea (HYDREA) 500 MG capsule, Take 2 pills once a day with food., Disp: 60 capsule, Rfl: 3 omeprazole (PRILOSEC) 20 MG capsule, Take 20 mg by mouth 2 (two) times daily before a meal., Disp: , Rfl: ;  polyethylene glycol powder (MIRALAX) powder, Take 1 Container by mouth as needed., Disp: , Rfl: ;  pravastatin (PRAVACHOL) 40 MG tablet, Take 40  mg by mouth daily., Disp: , Rfl: ;  HYDROcodone-homatropine (HYCODAN) 5-1.5 MG/5ML syrup, Take 5 mLs by mouth every 6 (six) hours as needed for cough., Disp: 240 mL, Rfl: 0  Allergies:  Allergies  Allergen Reactions  . Penicillins Itching and Rash    Past Medical History, Surgical history, Social history, and Family History were reviewed and updated.  Review of Systems: As above  Physical Exam:  height is 5\' 1"  (1.549 m) and weight is 188 lb (85.276 kg). Her oral temperature is 97.5 F (36.4 C). Her blood pressure is 139/69 and her pulse is 82. Her respiration is 14.   Well-developed and well-nourished [place] female. Head and neck exam shows no ocular or oral lesions. She has no palpable cervical or supraclavicular lymph nodes. Lungs are clear bilaterally. No wheezes are noted. Cardiac exam regular in rhythm with no murmurs, rubs or bruits. Abdomen is soft. She has good bowel sounds. There is no palpable liver or spleen tip. Extremities shows no clubbing, cyanosis or edema. She has good range of motion of her joints. Skin exam shows no rashes, ecchymosis or petechia. Neurological exam is non-focal.  Lab Results  Component Value Date   WBC 9.9 09/01/2014   HGB 12.8 09/01/2014   HCT 38.8 09/01/2014   MCV 83 09/01/2014   PLT 495* 09/01/2014     Chemistry      Component Value Date/Time   NA 144  07/21/2014 0940   NA 139 12/18/2012 1810   K 3.8 07/21/2014 0940   K 3.5 12/18/2012 1810   CL 104 07/21/2014 0940   CL 102 12/18/2012 1810   CO2 28 07/21/2014 0940   CO2 26 12/18/2012 1810   BUN 15 07/21/2014 0940   BUN 10 12/18/2012 1810   CREATININE 0.7 07/21/2014 0940   CREATININE 0.79 12/18/2012 1810      Component Value Date/Time   CALCIUM 9.1 07/21/2014 0940   CALCIUM 9.4 12/18/2012 1810   ALKPHOS 63 07/21/2014 0940   ALKPHOS 77 09/01/2012 1912   AST 15 07/21/2014 0940   AST 19 09/01/2012 1912   ALT 16 07/21/2014 0940   ALT 13 09/01/2012 1912   BILITOT 0.60 07/21/2014 0940   BILITOT 0.2*  09/01/2012 1912         Impression and Plan: [name] is 68 year old female with essential thrombocythemia. I will have to increase her Hydrea to 1000 mg a day. I just think that her blood count is going up a little bit too much.  For now, we will go ahead and plan to get her back in maybe 2 months.  I hope that she has a good, time up in [place].   [name], MD 9/28/20154:23 PM

## 2014-10-21 ENCOUNTER — Other Ambulatory Visit (HOSPITAL_BASED_OUTPATIENT_CLINIC_OR_DEPARTMENT_OTHER): Payer: Medicare Other | Admitting: Lab

## 2014-10-21 ENCOUNTER — Encounter: Payer: Self-pay | Admitting: Hematology & Oncology

## 2014-10-21 ENCOUNTER — Ambulatory Visit (HOSPITAL_BASED_OUTPATIENT_CLINIC_OR_DEPARTMENT_OTHER): Payer: Medicare Other | Admitting: Hematology & Oncology

## 2014-10-21 DIAGNOSIS — D573 Sickle-cell trait: Secondary | ICD-10-CM

## 2014-10-21 DIAGNOSIS — D473 Essential (hemorrhagic) thrombocythemia: Secondary | ICD-10-CM

## 2014-10-21 LAB — CBC WITH DIFFERENTIAL (CANCER CENTER ONLY)
BASO#: 0 10*3/uL (ref 0.0–0.2)
BASO%: 0.6 % (ref 0.0–2.0)
EOS ABS: 0.1 10*3/uL (ref 0.0–0.5)
EOS%: 1.8 % (ref 0.0–7.0)
HEMATOCRIT: 38.6 % (ref 34.8–46.6)
HGB: 12.6 g/dL (ref 11.6–15.9)
LYMPH#: 1.5 10*3/uL (ref 0.9–3.3)
LYMPH%: 22.5 % (ref 14.0–48.0)
MCH: 26.9 pg (ref 26.0–34.0)
MCHC: 32.6 g/dL (ref 32.0–36.0)
MCV: 82 fL (ref 81–101)
MONO#: 0.5 10*3/uL (ref 0.1–0.9)
MONO%: 7 % (ref 0.0–13.0)
NEUT#: 4.5 10*3/uL (ref 1.5–6.5)
NEUT%: 68.1 % (ref 39.6–80.0)
PLATELETS: 356 10*3/uL (ref 145–400)
RBC: 4.69 10*6/uL (ref 3.70–5.32)
RDW: 14.9 % (ref 11.1–15.7)
WBC: 6.6 10*3/uL (ref 3.9–10.0)

## 2014-10-21 LAB — IRON AND TIBC CHCC
%SAT: 34 % (ref 21–57)
IRON: 85 ug/dL (ref 41–142)
TIBC: 248 ug/dL (ref 236–444)
UIBC: 163 ug/dL (ref 120–384)

## 2014-10-21 LAB — RETICULOCYTES (CHCC)
ABS Retic: 52.8 10*3/uL (ref 19.0–186.0)
RBC.: 4.8 MIL/uL (ref 3.87–5.11)
Retic Ct Pct: 1.1 % (ref 0.4–2.3)

## 2014-10-21 LAB — FERRITIN CHCC: FERRITIN: 91 ng/mL (ref 9–269)

## 2014-10-21 LAB — CHCC SATELLITE - SMEAR

## 2014-10-21 NOTE — Progress Notes (Signed)
[facility]  [phone] [phone]  ID: [name] OB: November 01, 1946 MR#: [id number]#:[id number] Patient Care Team: Merrilee Seashore, MD as PCP - General (Internal Medicine)  DIAGNOSIS: Essential thrombocythemia-JAK2 POSITIVE  Sickle cell trait  INTERVAL HISTORY: [name] is here today for follow-up. She is doing ok. Her husband passed away 2 months ago so she has been though a lot of stress. She has headaches from grinding her teeth and is going to the dentist for this.  She starts physical therapy for her knees tomorrow. She will eventually need knee replacements.  She denies fever, chills, rash, cough, dizziness, SOB, chest pain, palpitations, abdominal pain, constipation, diarrhea, blood in urine or stool. She has had no bleeding.  She denies swelling, tenderness, numbness or tingling in her extremities.  Her appetite is good and she is drinking plenty of fluids. Her weight is stable.   CURRENT TREATMENT: Hydrea1000 mg by mouth twice a day  Folic acid 1 mg by mouth daily  REVIEW OF SYSTEMS: All other 10 point review of systems is negative.   PAST MEDICAL HISTORY: Past Medical History  Diagnosis Date  . Allergic rhinitis   . Asthma   . GERD (gastroesophageal reflux disease)   . Hyperlipidemia   . Vocal cord dysfunction   . Esophageal stricture   . Colonic polyp   . Osteoporosis   . DJD (degenerative joint disease) of knee     bilateral  . Hiatal hernia   . DDD (degenerative disc disease), lumbar 07/20/2011  . DJD (degenerative joint disease), lumbar 07/20/2011  . Diabetes mellitus, type 2     does not take medication; pt denies  . Essential thrombocythemia 12/24/2013   PAST SURGICAL HISTORY: Past Surgical History  Procedure Laterality Date  . Carpal tunnel release      bilateral  . Total abdominal hysterectomy    . Foot surgery      bilateral  . Knee athroscopy  Dec.3, 2014    left knee   FAMILY HISTORY Family History   Problem Relation Age of Onset  . Cancer Sister   . Heart attack Mother   . Asthma Brother   . Asthma      uncle  . Colon cancer Father 19  . Stomach cancer Neg Hx   . Esophageal cancer Neg Hx   . Rectal cancer Neg Hx    GYNECOLOGIC HISTORY:  No LMP recorded. Patient has had a hysterectomy.   SOCIAL HISTORY:  History   Social History  . Marital Status: Married    Spouse Name: N/A    Number of Children: N/A  . Years of Education: N/A   Occupational History  . retired    Social History Main Topics  . Smoking status: Former Smoker -- 1.00 packs/day for 20 years    Types: Cigarettes    Start date: 01/31/1975    Quit date: 08/09/1995  . Smokeless tobacco: Never Used     Comment: quit 18 years ago.  . Alcohol Use: No  . Drug Use: No  . Sexual Activity: Not on file   Other Topics Concern  . Not on file   Social History Narrative   ADVANCED DIRECTIVES: <no information>  HEALTH MAINTENANCE: History  Substance Use Topics  . Smoking status: Former Smoker -- 1.00 packs/day for 20 years    Types: Cigarettes    Start date: 01/31/1975    Quit date: 08/09/1995  . Smokeless tobacco: Never Used     Comment: quit  18 years ago.  . Alcohol Use: No   Colonoscopy: PAP: Bone density: Lipid panel:  Allergies  Allergen Reactions  . Penicillins Itching and Rash    Current Outpatient Prescriptions  Medication Sig Dispense Refill  . albuterol (PROVENTIL HFA) 108 (90 BASE) MCG/ACT inhaler Inhale 2 puffs into the lungs every 4 (four) hours as needed for wheezing or shortness of breath.     Marland Kitchen albuterol (PROVENTIL) (2.5 MG/3ML) 0.083% nebulizer solution Take 2.5 mg by nebulization every 4 (four) hours as needed for shortness of breath. For shortness of breath.    Marland Kitchen aspirin EC 81 MG tablet Take 81 mg by mouth daily.    . budesonide-formoterol (SYMBICORT) 160-4.5 MCG/ACT inhaler Inhale 2 puffs into the lungs 2 (two) times daily. 10.2 g 5  . cholecalciferol (VITAMIN D) 1000 UNITS  tablet Take 1,000 Units by mouth daily.    . fluticasone (FLONASE) 50 MCG/ACT nasal spray Place 1 spray into the nose 2 (two) times daily.     . hydroxyurea (HYDREA) 500 MG capsule Take 2 pills once a day with food. 60 capsule 3  . Magnesium 200 MG TABS Take by mouth 2 (two) times daily.    Marland Kitchen omeprazole (PRILOSEC) 20 MG capsule Take 20 mg by mouth 2 (two) times daily before a meal.    . pravastatin (PRAVACHOL) 40 MG tablet Take 40 mg by mouth daily.    Marland Kitchen HYDROcodone-homatropine (HYCODAN) 5-1.5 MG/5ML syrup Take 5 mLs by mouth every 6 (six) hours as needed for cough. 240 mL 0  . polyethylene glycol powder (MIRALAX) powder Take 1 Container by mouth as needed.     No current facility-administered medications for this visit.   OBJECTIVE: Filed Vitals:   10/21/14 0905  BP: 144/65  Pulse: 72  Temp: 98.4 F (36.9 C)  Resp: 14   Body mass index is 35.73 kg/(m^2). ECOG FS:0 - Asymptomatic Ocular: Sclerae unicteric, pupils equal, round and reactive to light Ear-nose-throat: Oropharynx clear, dentition fair Lymphatic: No cervical or supraclavicular adenopathy Lungs no rales or rhonchi, good excursion bilaterally Heart regular rate and rhythm, no murmur appreciated Abd soft, nontender, positive bowel sounds MSK no focal spinal tenderness, no joint edema Neuro: non-focal, well-oriented, appropriate affect Breasts: Deferred  LAB RESULTS: CMP     Component Value Date/Time   NA 140 09/01/2014 1331   NA 144 07/21/2014 0940   K 4.2 09/01/2014 1331   K 3.8 07/21/2014 0940   CL 104 09/01/2014 1331   CL 104 07/21/2014 0940   CO2 24 09/01/2014 1331   CO2 28 07/21/2014 0940   GLUCOSE 93 09/01/2014 1331   GLUCOSE 98 07/21/2014 0940   BUN 11 09/01/2014 1331   BUN 15 07/21/2014 0940   CREATININE 0.82 09/01/2014 1331   CREATININE 0.7 07/21/2014 0940   CALCIUM 9.1 09/01/2014 1331   CALCIUM 9.1 07/21/2014 0940   PROT 6.2 09/01/2014 1331   PROT 7.2 07/21/2014 0940   ALBUMIN 3.8 09/01/2014 1331    AST 9 09/01/2014 1331   AST 15 07/21/2014 0940   ALT 10 09/01/2014 1331   ALT 16 07/21/2014 0940   ALKPHOS 74 09/01/2014 1331   ALKPHOS 63 07/21/2014 0940   BILITOT 0.3 09/01/2014 1331   BILITOT 0.60 07/21/2014 0940   GFRNONAA 84* 12/18/2012 1810   GFRAA >90 12/18/2012 1810   No results found for: SPEP Lab Results  Component Value Date   WBC 6.6 10/21/2014   NEUTROABS 4.5 10/21/2014   HGB 12.6 10/21/2014   HCT  38.6 10/21/2014   MCV 82 10/21/2014   PLT 356 10/21/2014   No results found for: LABCA2 No components found for: ATFTD322 No results for input(s): INR in the last 168 hours.  STUDIES: No results found.  ASSESSMENT/PLAN: [name] is 68 year old [place] female with both thrombocythemia and sickle cell trait. She is asymptomatic at this time. Unfortunately she is still stressed over the loss of her husband. Her children and church family are doing a great job taking care of her.  She is responding very nicely to the Hydrea 1000 mg daily. Her CBC today was normal.  She will continue taking the folic acid for her sickle cell trait.  We will see her back in 4 months for labs and follow-up.  She is in agreement with this and knows to call here with any questions or concerns. We can certainly see her back here sooner if need be.   Eliezer Bottom, NP 10/21/2014 9:26 AM

## 2014-12-06 ENCOUNTER — Inpatient Hospital Stay (HOSPITAL_COMMUNITY)
Admission: EM | Admit: 2014-12-06 | Discharge: 2014-12-13 | DRG: 202 | Disposition: A | Discharge status: Home / Self Care | Payer: Medicare Other | Attending: Internal Medicine | Admitting: Internal Medicine

## 2014-12-06 ENCOUNTER — Encounter (HOSPITAL_COMMUNITY): Payer: Self-pay | Admitting: Vascular Surgery

## 2014-12-06 ENCOUNTER — Emergency Department (HOSPITAL_COMMUNITY): Payer: Medicare Other

## 2014-12-06 DIAGNOSIS — E782 Mixed hyperlipidemia: Secondary | ICD-10-CM | POA: Diagnosis present

## 2014-12-06 DIAGNOSIS — E119 Type 2 diabetes mellitus without complications: Secondary | ICD-10-CM | POA: Diagnosis present

## 2014-12-06 DIAGNOSIS — D573 Sickle-cell trait: Secondary | ICD-10-CM | POA: Diagnosis present

## 2014-12-06 DIAGNOSIS — J9620 Acute and chronic respiratory failure, unspecified whether with hypoxia or hypercapnia: Secondary | ICD-10-CM | POA: Insufficient documentation

## 2014-12-06 DIAGNOSIS — R0602 Shortness of breath: Secondary | ICD-10-CM | POA: Diagnosis present

## 2014-12-06 DIAGNOSIS — K219 Gastro-esophageal reflux disease without esophagitis: Secondary | ICD-10-CM | POA: Diagnosis present

## 2014-12-06 DIAGNOSIS — J849 Interstitial pulmonary disease, unspecified: Secondary | ICD-10-CM | POA: Insufficient documentation

## 2014-12-06 DIAGNOSIS — K222 Esophageal obstruction: Secondary | ICD-10-CM | POA: Diagnosis present

## 2014-12-06 DIAGNOSIS — D473 Essential (hemorrhagic) thrombocythemia: Secondary | ICD-10-CM | POA: Diagnosis present

## 2014-12-06 DIAGNOSIS — Z7951 Long term (current) use of inhaled steroids: Secondary | ICD-10-CM | POA: Diagnosis not present

## 2014-12-06 DIAGNOSIS — J9811 Atelectasis: Secondary | ICD-10-CM | POA: Diagnosis not present

## 2014-12-06 DIAGNOSIS — R0600 Dyspnea, unspecified: Secondary | ICD-10-CM

## 2014-12-06 DIAGNOSIS — Z7982 Long term (current) use of aspirin: Secondary | ICD-10-CM

## 2014-12-06 DIAGNOSIS — J9819 Other pulmonary collapse: Secondary | ICD-10-CM | POA: Insufficient documentation

## 2014-12-06 DIAGNOSIS — J309 Allergic rhinitis, unspecified: Secondary | ICD-10-CM | POA: Diagnosis present

## 2014-12-06 DIAGNOSIS — J45901 Unspecified asthma with (acute) exacerbation: Principal | ICD-10-CM

## 2014-12-06 DIAGNOSIS — Z87891 Personal history of nicotine dependence: Secondary | ICD-10-CM

## 2014-12-06 DIAGNOSIS — J9601 Acute respiratory failure with hypoxia: Secondary | ICD-10-CM | POA: Diagnosis present

## 2014-12-06 DIAGNOSIS — J441 Chronic obstructive pulmonary disease with (acute) exacerbation: Secondary | ICD-10-CM

## 2014-12-06 DIAGNOSIS — E785 Hyperlipidemia, unspecified: Secondary | ICD-10-CM | POA: Diagnosis present

## 2014-12-06 DIAGNOSIS — R0609 Other forms of dyspnea: Secondary | ICD-10-CM

## 2014-12-06 DIAGNOSIS — E139 Other specified diabetes mellitus without complications: Secondary | ICD-10-CM

## 2014-12-06 LAB — CBC WITH DIFFERENTIAL/PLATELET
Basophils Absolute: 0 10*3/uL (ref 0.0–0.1)
Basophils Relative: 0 % (ref 0–1)
EOS ABS: 0.1 10*3/uL (ref 0.0–0.7)
EOS PCT: 1 % (ref 0–5)
HCT: 37.1 % (ref 36.0–46.0)
Hemoglobin: 12.2 g/dL (ref 12.0–15.0)
LYMPHS ABS: 1.2 10*3/uL (ref 0.7–4.0)
LYMPHS PCT: 17 % (ref 12–46)
MCH: 26.5 pg (ref 26.0–34.0)
MCHC: 32.9 g/dL (ref 30.0–36.0)
MCV: 80.5 fL (ref 78.0–100.0)
MONOS PCT: 4 % (ref 3–12)
Monocytes Absolute: 0.3 10*3/uL (ref 0.1–1.0)
Neutro Abs: 5.7 10*3/uL (ref 1.7–7.7)
Neutrophils Relative %: 78 % — ABNORMAL HIGH (ref 43–77)
Platelets: 349 10*3/uL (ref 150–400)
RBC: 4.61 MIL/uL (ref 3.87–5.11)
RDW: 18.5 % — ABNORMAL HIGH (ref 11.5–15.5)
WBC: 7.3 10*3/uL (ref 4.0–10.5)

## 2014-12-06 LAB — COMPREHENSIVE METABOLIC PANEL
ALBUMIN: 3.5 g/dL (ref 3.5–5.2)
ALT: 12 U/L (ref 0–35)
ANION GAP: 7 (ref 5–15)
AST: 17 U/L (ref 0–37)
Alkaline Phosphatase: 65 U/L (ref 39–117)
BILIRUBIN TOTAL: 0.3 mg/dL (ref 0.3–1.2)
BUN: 15 mg/dL (ref 6–23)
CO2: 26 mmol/L (ref 19–32)
CREATININE: 0.95 mg/dL (ref 0.50–1.10)
Calcium: 9 mg/dL (ref 8.4–10.5)
Chloride: 106 mEq/L (ref 96–112)
GFR calc Af Amer: 69 mL/min — ABNORMAL LOW (ref >90)
GFR calc non Af Amer: 60 mL/min — ABNORMAL LOW (ref >90)
GLUCOSE: 112 mg/dL — AB (ref 70–99)
POTASSIUM: 4 mmol/L (ref 3.5–5.1)
Sodium: 139 mmol/L (ref 135–145)
Total Protein: 6.5 g/dL (ref 6.0–8.3)

## 2014-12-06 LAB — INFLUENZA PANEL BY PCR (TYPE A & B)
H1N1 flu by pcr: NOT DETECTED
INFLAPCR: NEGATIVE
INFLBPCR: NEGATIVE

## 2014-12-06 MED ORDER — IPRATROPIUM BROMIDE 0.02 % IN SOLN
0.5000 mg | Freq: Once | RESPIRATORY_TRACT | Status: AC
  Administered 2014-12-06: 0.5 mg via RESPIRATORY_TRACT
  Filled 2014-12-06: qty 2.5

## 2014-12-06 MED ORDER — FOLIC ACID 1 MG PO TABS
1.0000 mg | ORAL_TABLET | Freq: Every day | ORAL | Status: DC
  Administered 2014-12-06 – 2014-12-13 (×8): 1 mg via ORAL
  Filled 2014-12-06 (×9): qty 1

## 2014-12-06 MED ORDER — ALBUTEROL SULFATE (2.5 MG/3ML) 0.083% IN NEBU
5.0000 mg | INHALATION_SOLUTION | Freq: Once | RESPIRATORY_TRACT | Status: AC
  Administered 2014-12-06: 5 mg via RESPIRATORY_TRACT
  Filled 2014-12-06: qty 6

## 2014-12-06 MED ORDER — ONDANSETRON 4 MG PO TBDP
8.0000 mg | ORAL_TABLET | Freq: Once | ORAL | Status: AC
  Administered 2014-12-06: 8 mg via ORAL
  Filled 2014-12-06: qty 2

## 2014-12-06 MED ORDER — ACETAMINOPHEN 325 MG PO TABS
650.0000 mg | ORAL_TABLET | Freq: Four times a day (QID) | ORAL | Status: DC | PRN
  Administered 2014-12-08: 650 mg via ORAL
  Filled 2014-12-06: qty 2

## 2014-12-06 MED ORDER — FLUTICASONE PROPIONATE 50 MCG/ACT NA SUSP
1.0000 | Freq: Two times a day (BID) | NASAL | Status: DC
  Administered 2014-12-06 – 2014-12-13 (×15): 1 via NASAL
  Filled 2014-12-06 (×2): qty 16

## 2014-12-06 MED ORDER — ASPIRIN EC 81 MG PO TBEC
81.0000 mg | DELAYED_RELEASE_TABLET | Freq: Every day | ORAL | Status: DC
  Administered 2014-12-06 – 2014-12-13 (×8): 81 mg via ORAL
  Filled 2014-12-06 (×9): qty 1

## 2014-12-06 MED ORDER — ONDANSETRON HCL 4 MG/2ML IJ SOLN: 4.0000 mg | Freq: Four times a day (QID) | INTRAMUSCULAR | Status: DC | PRN

## 2014-12-06 MED ORDER — ONDANSETRON HCL 4 MG PO TABS: 4.0000 mg | ORAL_TABLET | Freq: Four times a day (QID) | ORAL | Status: DC | PRN

## 2014-12-06 MED ORDER — LEVOFLOXACIN 500 MG PO TABS
500.0000 mg | ORAL_TABLET | Freq: Every day | ORAL | Status: DC
  Administered 2014-12-07 – 2014-12-13 (×7): 500 mg via ORAL
  Filled 2014-12-06 (×7): qty 1

## 2014-12-06 MED ORDER — ENOXAPARIN SODIUM 40 MG/0.4ML ~~LOC~~ SOLN
40.0000 mg | SUBCUTANEOUS | Status: DC
  Administered 2014-12-06 – 2014-12-10 (×5): 40 mg via SUBCUTANEOUS
  Filled 2014-12-06 (×8): qty 0.4

## 2014-12-06 MED ORDER — POLYETHYLENE GLYCOL 3350 17 G PO PACK
17.0000 g | PACK | ORAL | Status: DC | PRN
  Administered 2014-12-09: 17 g via ORAL
  Filled 2014-12-06: qty 1

## 2014-12-06 MED ORDER — PRAVASTATIN SODIUM 40 MG PO TABS
40.0000 mg | ORAL_TABLET | Freq: Every day | ORAL | Status: DC
  Administered 2014-12-06 – 2014-12-12 (×7): 40 mg via ORAL
  Filled 2014-12-06 (×8): qty 1

## 2014-12-06 MED ORDER — METHYLPREDNISOLONE SODIUM SUCC 40 MG IJ SOLR
40.0000 mg | Freq: Two times a day (BID) | INTRAMUSCULAR | Status: DC
  Administered 2014-12-06 – 2014-12-08 (×4): 40 mg via INTRAVENOUS
  Filled 2014-12-06 (×7): qty 1

## 2014-12-06 MED ORDER — HYDROCODONE-HOMATROPINE 5-1.5 MG/5ML PO SYRP
5.0000 mL | ORAL_SOLUTION | Freq: Four times a day (QID) | ORAL | Status: DC | PRN
Start: 2014-12-06 — End: 2014-12-13
  Administered 2014-12-06 – 2014-12-09 (×6): 5 mL via ORAL
  Filled 2014-12-06 (×6): qty 5

## 2014-12-06 MED ORDER — LEVOFLOXACIN IN D5W 750 MG/150ML IV SOLN
750.0000 mg | Freq: Once | INTRAVENOUS | Status: AC
  Administered 2014-12-06: 750 mg via INTRAVENOUS
  Filled 2014-12-06: qty 150

## 2014-12-06 MED ORDER — VITAMIN D3 25 MCG (1000 UNIT) PO TABS
1000.0000 [IU] | ORAL_TABLET | Freq: Every day | ORAL | Status: DC
  Administered 2014-12-06 – 2014-12-13 (×8): 1000 [IU] via ORAL
  Filled 2014-12-06 (×9): qty 1

## 2014-12-06 MED ORDER — ALBUTEROL (5 MG/ML) CONTINUOUS INHALATION SOLN
15.0000 mg/h | INHALATION_SOLUTION | Freq: Once | RESPIRATORY_TRACT | Status: AC
  Administered 2014-12-06: 15 mg/h via RESPIRATORY_TRACT
  Filled 2014-12-06: qty 20

## 2014-12-06 MED ORDER — PREDNISONE 20 MG PO TABS
60.0000 mg | ORAL_TABLET | Freq: Once | ORAL | Status: AC
  Administered 2014-12-06: 60 mg via ORAL
  Filled 2014-12-06: qty 3

## 2014-12-06 MED ORDER — HYDROXYUREA 500 MG PO CAPS
1000.0000 mg | ORAL_CAPSULE | Freq: Two times a day (BID) | ORAL | Status: DC
Start: 2014-12-06 — End: 2014-12-13
  Administered 2014-12-06 – 2014-12-13 (×15): 1000 mg via ORAL
  Filled 2014-12-06 (×16): qty 2

## 2014-12-06 MED ORDER — IPRATROPIUM-ALBUTEROL 0.5-2.5 (3) MG/3ML IN SOLN
3.0000 mL | RESPIRATORY_TRACT | Status: DC
  Administered 2014-12-06 – 2014-12-07 (×4): 3 mL via RESPIRATORY_TRACT
  Filled 2014-12-06 (×4): qty 3

## 2014-12-06 MED ORDER — ACETAMINOPHEN 650 MG RE SUPP: 650.0000 mg | Freq: Four times a day (QID) | RECTAL | Status: DC | PRN

## 2014-12-06 NOTE — ED Provider Notes (Signed)
CSN: [id number]     Arrival date & time 12/06/14  2297 History   First MD Initiated Contact with Patient 12/06/14 0913     Chief Complaint  Patient presents with  . Asthma     (Consider location/radiation/quality/duration/timing/severity/associated sxs/prior Treatment) HPI Comments: [name] is a 69 y.o. female with a PMHx of allergic rhinitis, asthma, GERD, vocal cord dysfunction, esophageal stricture, osteoporosis, DJD (knees and lumbar spine), hiatal hernia, diet-controlled DM2, sickle cell trait, and essential thrombocytopenia, who presents to the ED with complaints of SOB, wheezing, and productive cough with yellow sputum x8 days. Pt states she thought she was coming down with a cold on Christmas day when her symptoms began, and she has been using her albuterol nebulizer and Symbicort inhaler twice daily with no relief. She reports associated symptoms of clear rhinorrhea, subjective fever of 100F on Christmas day, and nausea. Her SOB is worsened with coughing, and unrelieved by her inhalers. Denies ongoing fevers, chills, CP, LE swelling, hemoptysis, ear pain/drainage, eye drainage, sore throat, abd pain, vomiting, diarrhea, constipation, hematuria, dysuria, myalgias, arthralgias, weakness, paresthesias, or rashes. Denies recent surgery/immobilization/travel. Denies known sick contacts. No hx of DVT/PE. Does not smoke.  Patient is a 69 y.o. female presenting with shortness of breath. The history is provided by the patient. No language interpreter was used.  Shortness of Breath Severity:  Moderate Onset quality:  Gradual Duration:  8 days Timing:  Constant Progression:  Worsening Chronicity:  Recurrent Context: URI   Relieved by:  Nothing Worsened by:  Coughing Ineffective treatments:  Inhaler Associated symptoms: cough, fever (subjective 100F on 12/25), sputum production (yellow) and wheezing   Associated symptoms: no abdominal pain, no chest pain, no claudication, no  diaphoresis, no ear pain, no hemoptysis, no PND, no rash, no sore throat, no swollen glands and no vomiting   Cough:    Cough characteristics:  Productive   Sputum characteristics:  Yellow   Severity:  Moderate   Onset quality:  Gradual   Duration:  8 days   Timing:  Constant   Progression:  Worsening   Chronicity:  Recurrent Wheezing:    Severity:  Moderate   Onset quality:  Gradual   Duration:  8 days   Timing:  Constant   Progression:  Worsening   Chronicity:  Recurrent Risk factors: no hx of PE/DVT, no prolonged immobilization and no recent surgery     Past Medical History  Diagnosis Date  . Allergic rhinitis   . Asthma   . GERD (gastroesophageal reflux disease)   . Hyperlipidemia   . Vocal cord dysfunction   . Esophageal stricture   . Colonic polyp   . Osteoporosis   . DJD (degenerative joint disease) of knee     bilateral  . Hiatal hernia   . DDD (degenerative disc disease), lumbar 07/20/2011  . DJD (degenerative joint disease), lumbar 07/20/2011  . Diabetes mellitus, type 2     does not take medication; pt denies  . Essential thrombocythemia 12/24/2013   Past Surgical History  Procedure Laterality Date  . Carpal tunnel release      bilateral  . Total abdominal hysterectomy    . Foot surgery      bilateral  . Knee athroscopy  Dec.3, 2014    left knee   Family History  Problem Relation Age of Onset  . Cancer Sister   . Heart attack Mother   . Asthma Brother   . Asthma      uncle  .  Colon cancer Father 25  . Stomach cancer Neg Hx   . Esophageal cancer Neg Hx   . Rectal cancer Neg Hx    History  Substance Use Topics  . Smoking status: Former Smoker -- 1.00 packs/day for 20 years    Types: Cigarettes    Start date: 01/31/1975    Quit date: 08/09/1995  . Smokeless tobacco: Never Used     Comment: quit 18 years ago.  . Alcohol Use: No   OB History    No data available     Review of Systems  Constitutional: Positive for fever (subjective 100F on  12/25). Negative for diaphoresis.  HENT: Positive for rhinorrhea (clear). Negative for ear pain, sore throat and trouble swallowing.   Eyes: Negative for pain and discharge.  Respiratory: Positive for cough, sputum production (yellow), shortness of breath and wheezing. Negative for hemoptysis.   Cardiovascular: Negative for chest pain, claudication, leg swelling and PND.  Gastrointestinal: Negative for nausea, vomiting, abdominal pain, diarrhea and constipation.  Genitourinary: Negative for dysuria and hematuria.  Musculoskeletal: Negative for myalgias and arthralgias.  Skin: Negative for rash.  Allergic/Immunologic: Positive for environmental allergies. Negative for immunocompromised state.  Neurological: Negative for weakness and numbness.  Hematological: Negative for adenopathy.  Psychiatric/Behavioral: Negative for confusion.   10 Systems reviewed and are negative for acute change except as noted in the HPI.    Allergies  Penicillins  Home Medications   Prior to Admission medications   Medication Sig Start Date End Date Taking? Authorizing Provider  albuterol (PROVENTIL HFA) 108 (90 BASE) MCG/ACT inhaler Inhale 2 puffs into the lungs every 4 (four) hours as needed for wheezing or shortness of breath.     Historical Provider, MD  albuterol (PROVENTIL) (2.5 MG/3ML) 0.083% nebulizer solution Take 2.5 mg by nebulization every 4 (four) hours as needed for shortness of breath. For shortness of breath. 09/11/13   Collene Gobble, MD  aspirin EC 81 MG tablet Take 81 mg by mouth daily.    Historical Provider, MD  budesonide-formoterol (SYMBICORT) 160-4.5 MCG/ACT inhaler Inhale 2 puffs into the lungs 2 (two) times daily. 08/08/14   Tammy S Parrett, NP  cholecalciferol (VITAMIN D) 1000 UNITS tablet Take 1,000 Units by mouth daily.    Historical Provider, MD  fluticasone (FLONASE) 50 MCG/ACT nasal spray Place 1 spray into the nose 2 (two) times daily.     Historical Provider, MD   HYDROcodone-homatropine (HYCODAN) 5-1.5 MG/5ML syrup Take 5 mLs by mouth every 6 (six) hours as needed for cough. 12/31/13   Collene Gobble, MD  hydroxyurea (HYDREA) 500 MG capsule Take 2 pills once a day with food. 07/21/14   Eliezer Bottom, NP  Magnesium 200 MG TABS Take by mouth 2 (two) times daily.    Historical Provider, MD  omeprazole (PRILOSEC) 20 MG capsule Take 20 mg by mouth 2 (two) times daily before a meal.    Historical Provider, MD  polyethylene glycol powder (MIRALAX) powder Take 1 Container by mouth as needed.    Historical Provider, MD  pravastatin (PRAVACHOL) 40 MG tablet Take 40 mg by mouth daily.    Historical Provider, MD   BP 158/68 mmHg  Pulse 104  Temp(Src) 98.2 F (36.8 C) (Oral)  Resp 23  SpO2 99% Physical Exam  Constitutional: She is oriented to person, place, and time. She appears well-developed and well-nourished.  Non-toxic appearance. She appears distressed (coughing).  Afebrile, nontoxic, coughing but able to speak in full sentences, slightly tachypneic, tachycardic  during coughing spells but then HR returns to WNL after coughing ceases.  HENT:  Head: Normocephalic and atraumatic.  Mouth/Throat: Mucous membranes are normal.  Eyes: Conjunctivae and EOM are normal. Right eye exhibits no discharge. Left eye exhibits no discharge.  Neck: Normal range of motion. Neck supple.  Cardiovascular: Normal rate, regular rhythm, normal heart sounds and intact distal pulses.  Exam reveals no gallop and no friction rub.   No murmur heard. Tachycardic during coughing spells but otherwise RRR, nl s1/s2, no m/r/g  Pulmonary/Chest: Effort normal. Tachypnea noted. No respiratory distress. She has no decreased breath sounds. She has wheezes (diffuse expiratory). She has rhonchi (diffuse). She has no rales.  Slightly tachypneic, coughing, with diffuse expiratory wheezing throughout and diffuse rhonchi throughout  Abdominal: Soft. Normal appearance and bowel sounds are normal.  She exhibits no distension. There is no tenderness. There is no rigidity, no rebound and no guarding.  Soft, NTND, +BS throughout, no r/g/r  Musculoskeletal: Normal range of motion.  No pedal edema MAE x4  Neurological: She is alert and oriented to person, place, and time. She has normal strength. No sensory deficit.  Skin: Skin is warm, dry and intact. No rash noted.  Psychiatric: She has a normal mood and affect.  Nursing note and vitals reviewed.   ED Course  Procedures (including critical care time) Labs Review Labs Reviewed  CBC WITH DIFFERENTIAL - Abnormal; Notable for the following:    RDW 18.5 (*)    Neutrophils Relative % 78 (*)    All other components within normal limits  COMPREHENSIVE METABOLIC PANEL - Abnormal; Notable for the following:    Glucose, Bld 112 (*)    GFR calc non Af Amer 60 (*)    GFR calc Af Amer 69 (*)    All other components within normal limits    Imaging Review Dg Chest 2 View  12/06/2014   CLINICAL DATA:  Shortness of breath, cough and wheezing for 2 days, history of asthma  EXAM: CHEST - 2 VIEW  COMPARISON:  07/31/2013  FINDINGS: Lungs are clear. Heart size and mediastinal contours are within normal limits. No effusion.  Atheromatous aortic arch. Visualized skeletal structures are unremarkable.  IMPRESSION: No acute cardiopulmonary disease.   Electronically Signed   By: Arne Cleveland M.D.   On: 12/06/2014 10:38     EKG Interpretation None      MDM   Final diagnoses:  SOB (shortness of breath)  Asthma with acute exacerbation, unspecified asthma severity    69 y.o. female with SOB/wheezing/cough. Hx of asthma. Will give nebs, prednisone, and obtain CXR. Nausea likely related to coughing and swallowing sputum, will give zofran now. Dr. Darl Householder saw pt and agrees with plan. Doubt ACS/DVT/PE/Dissection. Will reassess after nebs and CXR.  11:05 AM CXR unremarkable. Pt lung sounds without improvement, and SpO2 now 94% on RA. She states this feels  similar to when she last needed to be admitted. Will start continuous nebs and obtain labs.   12:55 PM After continuous nebs, wheezing slightly improved but overall rhonchi and breath sounds still unchanged, pt still coughing vigorously which causes some distress, SpO2 drops down during coughing spells, will proceed with admission.   1:19 PM Dr. Cruzita Lederer returning page, will admit to med-surg. Admission orders placed. Please see his H&P for further documentation on care.  BP 120/56 mmHg  Pulse 75  Temp(Src) 98.2 F (36.8 C) (Oral)  Resp 17  SpO2 99%  Meds ordered this encounter  Medications  .  albuterol (PROVENTIL) (2.5 MG/3ML) 0.083% nebulizer solution 5 mg    Sig:   . ipratropium (ATROVENT) nebulizer solution 0.5 mg    Sig:   . predniSONE (DELTASONE) tablet 60 mg    Sig:   . ondansetron (ZOFRAN-ODT) disintegrating tablet 8 mg    Sig:   . albuterol (PROVENTIL,VENTOLIN) solution continuous neb    Sig:   . ipratropium (ATROVENT) nebulizer solution 0.5 mg    Sig:      Patty Sermons [place], PA-C 12/06/14 [place] Y[place], MD 12/06/14 1357

## 2014-12-06 NOTE — ED Notes (Signed)
attempted report 

## 2014-12-06 NOTE — H&P (Signed)
History and Physical    [name] XQJ:[id number] DOB: 23-Feb-1946 DOA: 12/06/2014  Referring physician: Dr. Darl Householder PCP: Merrilee Seashore, MD  Specialists: none  Chief Complaint: shortness of breath   HPI: [name] is a 69 y.o. female has a past medical history significant for VCD, asthma. TIA. HLD, essential thrombocythemia, presents to the ED with a chief complaint of shortness of breath. Patient has been feeling a little bit more dyspneic since Christmastime, however she was able to use her home nebulizer and was able to avoid coming to the emergency room. However last night and today, she was unable to have relief from her home medications, she was very dyspneic and decided to come to the emergency room. She endorses a cough that's beginning to have yellow purulent sputum production. She endorses sick contacts, any burning the emergency room her daughter is coughing and seems to have some respiratory symptoms. She endorses mild nausea, denies any abdominal pain vomiting or diarrhea. She denies any chest pain. She denies any lightheadedness or dizziness. In the emergency room, her vital signs are stable, her blood work is fairly unremarkable, however she was unable to have relief with continues and still has significant wheezing, thusTRH was asked for admission.  Review of Systems: As per history of present illness, otherwise 10 point negative  Past Medical History  Diagnosis Date  . Allergic rhinitis   . Asthma   . GERD (gastroesophageal reflux disease)   . Hyperlipidemia   . Vocal cord dysfunction   . Esophageal stricture   . Colonic polyp   . Osteoporosis   . DJD (degenerative joint disease) of knee     bilateral  . Hiatal hernia   . DDD (degenerative disc disease), lumbar 07/20/2011  . DJD (degenerative joint disease), lumbar 07/20/2011  . Diabetes mellitus, type 2     does not take medication; pt denies  . Essential thrombocythemia 12/24/2013   Past Surgical History    Procedure Laterality Date  . Carpal tunnel release      bilateral  . Total abdominal hysterectomy    . Foot surgery      bilateral  . Knee athroscopy  Dec.3, 2014    left knee   Social History:  reports that she quit smoking about 19 years ago. Her smoking use included Cigarettes. She started smoking about 39 years ago. She has a 20 pack-year smoking history. She has never used smokeless tobacco. She reports that she does not drink alcohol or use illicit drugs.  Allergies  Allergen Reactions  . Penicillins Itching and Rash    Family History  Problem Relation Age of Onset  . Cancer Sister   . Heart attack Mother   . Asthma Brother   . Asthma      uncle  . Colon cancer Father 55  . Stomach cancer Neg Hx   . Esophageal cancer Neg Hx   . Rectal cancer Neg Hx     Prior to Admission medications   Medication Sig Start Date End Date Taking? Authorizing Provider  albuterol (PROVENTIL HFA) 108 (90 BASE) MCG/ACT inhaler Inhale 2 puffs into the lungs every 4 (four) hours as needed for wheezing or shortness of breath.    Yes Historical Provider, MD  albuterol (PROVENTIL) (2.5 MG/3ML) 0.083% nebulizer solution Take 2.5 mg by nebulization every 4 (four) hours as needed for shortness of breath. For shortness of breath. 09/11/13  Yes Collene Gobble, MD  aspirin EC 81 MG tablet Take 81 mg  by mouth daily.   Yes Historical Provider, MD  budesonide-formoterol (SYMBICORT) 160-4.5 MCG/ACT inhaler Inhale 2 puffs into the lungs 2 (two) times daily. 08/08/14  Yes Tammy S Parrett, NP  cholecalciferol (VITAMIN D) 1000 UNITS tablet Take 1,000 Units by mouth daily.   Yes Historical Provider, MD  fluticasone (FLONASE) 50 MCG/ACT nasal spray Place 1 spray into the nose 2 (two) times daily.    Yes Historical Provider, MD  hydroxyurea (HYDREA) 500 MG capsule Take 2 pills once a day with food. Patient taking differently: Take 1,000 mg by mouth 2 (two) times daily. Take 2 pills once a day with food. 07/21/14  Yes  Eliezer Bottom, NP  Magnesium 200 MG TABS Take by mouth 2 (two) times daily.   Yes Historical Provider, MD  omeprazole (PRILOSEC) 20 MG capsule Take 20 mg by mouth 2 (two) times daily before a meal.   Yes Historical Provider, MD  polyethylene glycol powder (MIRALAX) powder Take 1 Container by mouth as needed.   Yes Historical Provider, MD  pravastatin (PRAVACHOL) 40 MG tablet Take 40 mg by mouth daily.   Yes Historical Provider, MD  HYDROcodone-homatropine (HYCODAN) 5-1.5 MG/5ML syrup Take 5 mLs by mouth every 6 (six) hours as needed for cough. Patient not taking: Reported on 12/06/2014 12/31/13   Collene Gobble, MD   Physical Exam: Filed Vitals:   12/06/14 1245 12/06/14 1300 12/06/14 1315 12/06/14 1330  BP:  119/53  115/59  Pulse: 95 96 96 99  Temp:      TempSrc:      Resp: 17 19 17 22   SpO2: 100% 94% 96% 98%    General:  Mild audible wheezing, however she appears in no apparent distress, speaks in full sentences   Eyes: no scleral icterus  ENT: moist oropharynx  Neck: supple, no lymphadenopathy  Cardiovascular: regular rate without MRG; 2+ peripheral pulses, no JVD, no peripheral edema  Respiratory: scattered wheezing throughout bilateral lung fields, moves air well   Abdomen: soft, non tender to palpation  Skin: no rashes  Musculoskeletal: normal bulk and tone, no joint swelling  Psychiatric: normal mood and affect  Neurologic:nonfocal   Labs on Admission:  Basic Metabolic Panel:  Recent Labs Lab 12/06/14 1146  NA 139  K 4.0  CL 106  CO2 26  GLUCOSE 112*  BUN 15  CREATININE 0.95  CALCIUM 9.0   Liver Function Tests:  Recent Labs Lab 12/06/14 1146  AST 17  ALT 12  ALKPHOS 65  BILITOT 0.3  PROT 6.5  ALBUMIN 3.5   CBC:  Recent Labs Lab 12/06/14 1146  WBC 7.3  NEUTROABS 5.7  HGB 12.2  HCT 37.1  MCV 80.5  PLT 349   Radiological Exams on Admission: Dg Chest 2 View  12/06/2014   CLINICAL DATA:  Shortness of breath, cough and wheezing for 2  days, history of asthma  EXAM: CHEST - 2 VIEW  COMPARISON:  07/31/2013  FINDINGS: Lungs are clear. Heart size and mediastinal contours are within normal limits. No effusion.  Atheromatous aortic arch. Visualized skeletal structures are unremarkable.  IMPRESSION: No acute cardiopulmonary disease.   Electronically Signed   By: Arne Cleveland M.D.   On: 12/06/2014 10:38   Assessment/Plan Active Problems:   Diabetes   Hyperlipidemia   Essential thrombocythemia   Asthma exacerbation   Acute asthma exacerbation -  this seems to have started around Christmas, may have had a viral upper respiratory infection, now she's developed more productive yellow cough. - Admit  to MedSurg floor - Start Duonebs every 4 hours - IV Solu-Medrol - Levofloxacin - Monitor respiratory status, provide oxygen as needed - Chest x-ray unremarkable - Check influenza panel  HLD - continue statin  DM - noted in chart, on no medications - check A1C  Essential thrombocytopenia  Sickle cell trait - followed by Dr. Marin Olp - continue Hydrea - continue folic acid  Diet: regular Fluids: none DVT Prophylaxis: lovenox  Code Status: Full  Family Communication: d/w daughter bedside  Disposition Plan: admit to MedSurg  Time spent: 41  Costin M. Cruzita Lederer, MD [facility] Pager [phone]  If 7PM-7AM, please contact night-coverage [email or web address] Password [email or web address] 12/06/2014, 1:40 PM

## 2014-12-06 NOTE — ED Notes (Signed)
Patient transported to X-ray 

## 2014-12-06 NOTE — ED Notes (Addendum)
Pt reports to the ED for eval of SOB, wheezing, and a productive yellow cough since 12/25. She has a hx of asthma. She also reports nausea but denies any active V/D. Pt reports chills but is unsure if she has a fever or not. She is tachypnic but able to speak in full sentences. Pt sinus tach at 100s - 110s on the monitor. Pt reports she has been using her nebulizer and Symbicort bid.  Pt A&Ox4 and skin warm and dry.

## 2014-12-07 DIAGNOSIS — E785 Hyperlipidemia, unspecified: Secondary | ICD-10-CM

## 2014-12-07 DIAGNOSIS — D473 Essential (hemorrhagic) thrombocythemia: Secondary | ICD-10-CM

## 2014-12-07 DIAGNOSIS — E119 Type 2 diabetes mellitus without complications: Secondary | ICD-10-CM

## 2014-12-07 LAB — BASIC METABOLIC PANEL
ANION GAP: 12 (ref 5–15)
BUN: 15 mg/dL (ref 6–23)
CO2: 21 mmol/L (ref 19–32)
CREATININE: 1.04 mg/dL (ref 0.50–1.10)
Calcium: 9 mg/dL (ref 8.4–10.5)
Chloride: 104 mEq/L (ref 96–112)
GFR calc Af Amer: 62 mL/min — ABNORMAL LOW (ref >90)
GFR, EST NON AFRICAN AMERICAN: 54 mL/min — AB (ref >90)
GLUCOSE: 138 mg/dL — AB (ref 70–99)
Potassium: 4.2 mmol/L (ref 3.5–5.1)
Sodium: 137 mmol/L (ref 135–145)

## 2014-12-07 LAB — CBC
HCT: 36.5 % (ref 36.0–46.0)
HEMOGLOBIN: 11.9 g/dL — AB (ref 12.0–15.0)
MCH: 25.6 pg — ABNORMAL LOW (ref 26.0–34.0)
MCHC: 32.6 g/dL (ref 30.0–36.0)
MCV: 78.7 fL (ref 78.0–100.0)
PLATELETS: 438 10*3/uL — AB (ref 150–400)
RBC: 4.64 MIL/uL (ref 3.87–5.11)
RDW: 18.3 % — ABNORMAL HIGH (ref 11.5–15.5)
WBC: 10.9 10*3/uL — ABNORMAL HIGH (ref 4.0–10.5)

## 2014-12-07 LAB — HEMOGLOBIN A1C
Hgb A1c MFr Bld: 5.6 % (ref <5.7)
Mean Plasma Glucose: 114 mg/dL (ref <117)

## 2014-12-07 MED ORDER — GUAIFENESIN ER 600 MG PO TB12
600.0000 mg | ORAL_TABLET | Freq: Two times a day (BID) | ORAL | Status: DC
  Administered 2014-12-07 – 2014-12-13 (×13): 600 mg via ORAL
  Filled 2014-12-07 (×15): qty 1

## 2014-12-07 MED ORDER — IPRATROPIUM-ALBUTEROL 0.5-2.5 (3) MG/3ML IN SOLN
3.0000 mL | Freq: Four times a day (QID) | RESPIRATORY_TRACT | Status: DC
  Administered 2014-12-07 (×2): 3 mL via RESPIRATORY_TRACT
  Filled 2014-12-07 (×2): qty 3

## 2014-12-07 MED ORDER — BUDESONIDE 0.25 MG/2ML IN SUSP
0.2500 mg | Freq: Two times a day (BID) | RESPIRATORY_TRACT | Status: DC
  Administered 2014-12-07 – 2014-12-13 (×10): 0.25 mg via RESPIRATORY_TRACT
  Filled 2014-12-07 (×16): qty 2

## 2014-12-07 MED ORDER — ALBUTEROL SULFATE (2.5 MG/3ML) 0.083% IN NEBU: 2.5000 mg | INHALATION_SOLUTION | RESPIRATORY_TRACT | Status: DC | PRN

## 2014-12-07 NOTE — Progress Notes (Signed)
PATIENT DETAILS Name: [name] Age: 69 y.o. Sex: female Date of Birth: Jun 05, 1946 Admit Date: 12/06/2014 Admitting Physician Costin Karlyne Greenspan, MD JSE:[id number], MD  Subjective: Feels better  Assessment/Plan: Active Problems:   Asthma exacerbation:significantly improved, suspect triggered by a viral syndrome-but Influenza PCR neg.CXR neg for PNA. Moving air well, lungs with only scattered rhonchi. Continue Solumedrol, nebulized bronchodilators and empiric Levaquin, suspect if clinical improvement continues, she should be able to go home in am.    Diabetes:diet controlled. A1C pendng    Hyperlipidemia:continue Statin    Essential thrombocythemia:platelet count stable, continue with Hydrea.     Hx of Sickle cell trait:continue Folate  Disposition: Remain inpatient-suspect home in am  Antibiotics:  See below    Anti-infectives    Start     Dose/Rate Route Frequency Ordered Stop   12/07/14 1000  levofloxacin (LEVAQUIN) tablet 500 mg     500 mg Oral Daily 12/06/14 1337     12/06/14 1330  levofloxacin (LEVAQUIN) IVPB 750 mg     750 mg100 mL/hr over 90 Minutes Intravenous  Once 12/06/14 1321 12/06/14 1507      DVT Prophylaxis: Prophylactic Lovenox  Code Status: Full code  Family Communication None at bedside, patient awake, alert and understanding of above plan  Procedures:  None  CONSULTS:  None  Time spent 40 minutes-which includes 50% of the time with face-to-face with patient/ family and coordinating care related to the above assessment and plan.  MEDICATIONS: Scheduled Meds: . aspirin EC  81 mg Oral Daily  . cholecalciferol  1,000 Units Oral Daily  . enoxaparin (LOVENOX) injection  40 mg Subcutaneous Q24H  . fluticasone  1 spray Each Nare BID  . folic acid  1 mg Oral Daily  . hydroxyurea  1,000 mg Oral BID  . ipratropium-albuterol  3 mL Nebulization Q4H  . levofloxacin  500 mg Oral Daily  . methylPREDNISolone (SOLU-MEDROL)  injection  40 mg Intravenous Q12H  . pravastatin  40 mg Oral Daily   Continuous Infusions:  PRN Meds:.acetaminophen **OR** acetaminophen, HYDROcodone-homatropine, ondansetron **OR** ondansetron (ZOFRAN) IV, polyethylene glycol    PHYSICAL EXAM: Vital signs in last 24 hours: Filed Vitals:   12/06/14 2300 12/07/14 0028 12/07/14 0622 12/07/14 0755  BP:   128/61   Pulse:   86   Temp:   98.4 F (36.9 C)   TempSrc:      Resp:   19   Height: 5\' 3"  (1.6 m)     Weight: 63.504 kg (140 lb)     SpO2:  100% 98% 98%    Weight change:  Filed Weights   12/06/14 2300  Weight: 63.504 kg (140 lb)   Body mass index is 24.81 kg/(m^2).   Gen Exam: Awake and alert with clear speech.   Neck: Supple, No JVD.   Chest: Good air movement bilaterally, scattered rhonchi CVS: S1 S2 Regular, no murmurs.  Abdomen: soft, BS +, non tender, non distended.  Extremities: no edema, lower extremities warm to touch. Neurologic: Non Focal.   Skin: No Rash.   Wounds: N/A.   Intake/Output from previous day: No intake or output data in the 24 hours ending 12/07/14 0913   LAB RESULTS: CBC  Recent Labs Lab 12/06/14 1146 12/07/14 0530  WBC 7.3 10.9*  HGB 12.2 11.9*  HCT 37.1 36.5  PLT 349 438*  MCV 80.5 78.7  MCH 26.5 25.6*  MCHC 32.9 32.6  RDW 18.5* 18.3*  LYMPHSABS 1.2  --  MONOABS 0.3  --   EOSABS 0.1  --   BASOSABS 0.0  --     Chemistries   Recent Labs Lab 12/06/14 1146 12/07/14 0530  NA 139 137  K 4.0 4.2  CL 106 104  CO2 26 21  GLUCOSE 112* 138*  BUN 15 15  CREATININE 0.95 1.04  CALCIUM 9.0 9.0    CBG: No results for input(s): GLUCAP in the last 168 hours.  GFR Estimated Creatinine Clearance: 45.8 mL/min (by C-G formula based on Cr of 1.04).  Coagulation profile No results for input(s): INR, PROTIME in the last 168 hours.  Cardiac Enzymes No results for input(s): CKMB, TROPONINI, MYOGLOBIN in the last 168 hours.  Invalid input(s): CK  Invalid input(s): POCBNP No  results for input(s): DDIMER in the last 72 hours. No results for input(s): HGBA1C in the last 72 hours. No results for input(s): CHOL, HDL, LDLCALC, TRIG, CHOLHDL, LDLDIRECT in the last 72 hours. No results for input(s): TSH, T4TOTAL, T3FREE, THYROIDAB in the last 72 hours.  Invalid input(s): FREET3 No results for input(s): VITAMINB12, FOLATE, FERRITIN, TIBC, IRON, RETICCTPCT in the last 72 hours. No results for input(s): LIPASE, AMYLASE in the last 72 hours.  Urine Studies No results for input(s): UHGB, CRYS in the last 72 hours.  Invalid input(s): UACOL, UAPR, USPG, UPH, UTP, UGL, UKET, UBIL, UNIT, UROB, ULEU, UEPI, UWBC, URBC, UBAC, CAST, UCOM, BILUA  MICROBIOLOGY: No results found for this or any previous visit (from the past 240 hour(s)).  RADIOLOGY STUDIES/RESULTS: Dg Chest 2 View  12/06/2014   CLINICAL DATA:  Shortness of breath, cough and wheezing for 2 days, history of asthma  EXAM: CHEST - 2 VIEW  COMPARISON:  07/31/2013  FINDINGS: Lungs are clear. Heart size and mediastinal contours are within normal limits. No effusion.  Atheromatous aortic arch. Visualized skeletal structures are unremarkable.  IMPRESSION: No acute cardiopulmonary disease.   Electronically Signed   By: Arne Cleveland M.D.   On: 12/06/2014 10:38    Oren Binet, MD  [facility] Pager:[phone]  If 7PM-7AM, please contact night-coverage [email or web address] Password [email or web address] 12/07/2014, 9:13 AM   LOS: 1 day

## 2014-12-08 MED ORDER — METHYLPREDNISOLONE SODIUM SUCC 40 MG IJ SOLR
40.0000 mg | Freq: Three times a day (TID) | INTRAMUSCULAR | Status: DC
  Administered 2014-12-08 – 2014-12-13 (×15): 40 mg via INTRAVENOUS
  Filled 2014-12-08 (×16): qty 1

## 2014-12-08 MED ORDER — IPRATROPIUM-ALBUTEROL 0.5-2.5 (3) MG/3ML IN SOLN
3.0000 mL | Freq: Four times a day (QID) | RESPIRATORY_TRACT | Status: DC
Start: 2014-12-08 — End: 2014-12-13
  Administered 2014-12-08 – 2014-12-13 (×18): 3 mL via RESPIRATORY_TRACT
  Filled 2014-12-08 (×2): qty 3
  Filled 2014-12-08: qty 30
  Filled 2014-12-08 (×16): qty 3

## 2014-12-08 MED ORDER — ALBUTEROL SULFATE (2.5 MG/3ML) 0.083% IN NEBU
2.5000 mg | INHALATION_SOLUTION | RESPIRATORY_TRACT | Status: DC | PRN
  Filled 2014-12-08: qty 3

## 2014-12-08 MED ORDER — PANTOPRAZOLE SODIUM 40 MG PO TBEC
40.0000 mg | DELAYED_RELEASE_TABLET | Freq: Every day | ORAL | Status: DC
  Administered 2014-12-08 – 2014-12-11 (×4): 40 mg via ORAL
  Filled 2014-12-08 (×4): qty 1

## 2014-12-08 MED ORDER — BISACODYL 10 MG RE SUPP
10.0000 mg | Freq: Every day | RECTAL | Status: DC | PRN
  Administered 2014-12-08 – 2014-12-11 (×2): 10 mg via RECTAL
  Filled 2014-12-08 (×3): qty 1

## 2014-12-08 MED ORDER — IPRATROPIUM-ALBUTEROL 0.5-2.5 (3) MG/3ML IN SOLN
3.0000 mL | Freq: Three times a day (TID) | RESPIRATORY_TRACT | Status: DC
  Administered 2014-12-08: 3 mL via RESPIRATORY_TRACT
  Filled 2014-12-08: qty 3

## 2014-12-08 MED ORDER — ALUM & MAG HYDROXIDE-SIMETH 200-200-20 MG/5ML PO SUSP
30.0000 mL | Freq: Four times a day (QID) | ORAL | Status: DC | PRN
  Administered 2014-12-08 – 2014-12-09 (×2): 30 mL via ORAL
  Filled 2014-12-08 (×3): qty 30

## 2014-12-08 NOTE — Progress Notes (Signed)
PATIENT DETAILS Name: [name] Age: 69 y.o. Sex: female Date of Birth: 02-Oct-1946 Admit Date: 12/06/2014 Admitting Physician Costin Karlyne Greenspan, MD VWP:[id number],MOLMB, MD  Subjective: Feels better-but still not back to baseline.  Assessment/Plan: Active Problems:   Asthma exacerbation:significantly improved but still with some wheezing, claims still not back to baseline.Suspect triggered by a viral syndrome-but Influenza PCR neg.CXR neg for PNA. Moving air well, lungs with some more coarse rhonchi today. Continue Solumedrol, nebulized bronchodilators and empiric Levaquin, suspect if clinical improvement continues, she should be able to go home tomorrow.    Diabetes:diet controlled. A1C 5.9    Hyperlipidemia:continue Statin    Essential thrombocythemia:platelet count stable, continue with Hydrea.     Hx of Sickle cell trait:continue Folate  Disposition: Remain inpatient-home on 1/5 if better  Antibiotics:  See below    Anti-infectives    Start     Dose/Rate Route Frequency Ordered Stop   12/07/14 1000  levofloxacin (LEVAQUIN) tablet 500 mg     500 mg Oral Daily 12/06/14 1337     12/06/14 1330  levofloxacin (LEVAQUIN) IVPB 750 mg     750 mg100 mL/hr over 90 Minutes Intravenous  Once 12/06/14 1321 12/06/14 1507      DVT Prophylaxis: Prophylactic Lovenox  Code Status: Full code  Family Communication None at bedside, patient awake, alert and understanding of above plan  Procedures:  None  CONSULTS:  None  MEDICATIONS: Scheduled Meds: . aspirin EC  81 mg Oral Daily  . budesonide (PULMICORT) nebulizer solution  0.25 mg Nebulization BID  . cholecalciferol  1,000 Units Oral Daily  . enoxaparin (LOVENOX) injection  40 mg Subcutaneous Q24H  . fluticasone  1 spray Each Nare BID  . folic acid  1 mg Oral Daily  . guaiFENesin  600 mg Oral BID  . hydroxyurea  1,000 mg Oral BID  . ipratropium-albuterol  3 mL Nebulization Q6H  . levofloxacin  500 mg  Oral Daily  . methylPREDNISolone (SOLU-MEDROL) injection  40 mg Intravenous 3 times per day  . pravastatin  40 mg Oral Daily   Continuous Infusions:  PRN Meds:.acetaminophen **OR** acetaminophen, albuterol, bisacodyl, HYDROcodone-homatropine, ondansetron **OR** ondansetron (ZOFRAN) IV, polyethylene glycol    PHYSICAL EXAM: Vital signs in last 24 hours: Filed Vitals:   12/07/14 1154 12/07/14 1305 12/07/14 2201 12/08/14 0522  BP:  111/64 112/57 127/70  Pulse:  85 88 82  Temp:  98.3 F (36.8 C) 98 F (36.7 C) 97.7 F (36.5 C)  TempSrc:  Oral Oral   Resp:  18 18 17   Height:      Weight:      SpO2: 98% 100% 100% 98%    Weight change:  Filed Weights   12/06/14 2300  Weight: 63.504 kg (140 lb)   Body mass index is 24.81 kg/(m^2).   Gen Exam: Awake and alert with clear speech.   Neck: Supple, No JVD.   Chest: Good air movement bilaterally, +coarser rhonchi today CVS: S1 S2 Regular, no murmurs.  Abdomen: soft, BS +, non tender, non distended.  Extremities: no edema, lower extremities warm to touch. Neurologic: Non Focal.   Skin: No Rash.   Wounds: N/A.   Intake/Output from previous day: No intake or output data in the 24 hours ending 12/08/14 1121   LAB RESULTS: CBC  Recent Labs Lab 12/06/14 1146 12/07/14 0530  WBC 7.3 10.9*  HGB 12.2 11.9*  HCT 37.1 36.5  PLT 349 438*  MCV 80.5  78.7  MCH 26.5 25.6*  MCHC 32.9 32.6  RDW 18.5* 18.3*  LYMPHSABS 1.2  --   MONOABS 0.3  --   EOSABS 0.1  --   BASOSABS 0.0  --     Chemistries   Recent Labs Lab 12/06/14 1146 12/07/14 0530  NA 139 137  K 4.0 4.2  CL 106 104  CO2 26 21  GLUCOSE 112* 138*  BUN 15 15  CREATININE 0.95 1.04  CALCIUM 9.0 9.0    CBG: No results for input(s): GLUCAP in the last 168 hours.  GFR Estimated Creatinine Clearance: 45.8 mL/min (by C-G formula based on Cr of 1.04).  Coagulation profile No results for input(s): INR, PROTIME in the last 168 hours.  Cardiac Enzymes No results  for input(s): CKMB, TROPONINI, MYOGLOBIN in the last 168 hours.  Invalid input(s): CK  Invalid input(s): POCBNP No results for input(s): DDIMER in the last 72 hours.  Recent Labs  12/07/14 0530  HGBA1C 5.6   No results for input(s): CHOL, HDL, LDLCALC, TRIG, CHOLHDL, LDLDIRECT in the last 72 hours. No results for input(s): TSH, T4TOTAL, T3FREE, THYROIDAB in the last 72 hours.  Invalid input(s): FREET3 No results for input(s): VITAMINB12, FOLATE, FERRITIN, TIBC, IRON, RETICCTPCT in the last 72 hours. No results for input(s): LIPASE, AMYLASE in the last 72 hours.  Urine Studies No results for input(s): UHGB, CRYS in the last 72 hours.  Invalid input(s): UACOL, UAPR, USPG, UPH, UTP, UGL, UKET, UBIL, UNIT, UROB, ULEU, UEPI, UWBC, URBC, UBAC, CAST, UCOM, BILUA  MICROBIOLOGY: No results found for this or any previous visit (from the past 240 hour(s)).  RADIOLOGY STUDIES/RESULTS: Dg Chest 2 View  12/06/2014   CLINICAL DATA:  Shortness of breath, cough and wheezing for 2 days, history of asthma  EXAM: CHEST - 2 VIEW  COMPARISON:  07/31/2013  FINDINGS: Lungs are clear. Heart size and mediastinal contours are within normal limits. No effusion.  Atheromatous aortic arch. Visualized skeletal structures are unremarkable.  IMPRESSION: No acute cardiopulmonary disease.   Electronically Signed   By: Arne Cleveland M.D.   On: 12/06/2014 10:38    Oren Binet, MD  [facility] Pager:[phone]  If 7PM-7AM, please contact night-coverage [email or web address] Password [email or web address] 12/08/2014, 11:21 AM   LOS: 2 days

## 2014-12-09 ENCOUNTER — Inpatient Hospital Stay (HOSPITAL_COMMUNITY): Payer: Medicare Other

## 2014-12-09 ENCOUNTER — Encounter (HOSPITAL_COMMUNITY): Payer: Self-pay | Admitting: Radiology

## 2014-12-09 DIAGNOSIS — R0602 Shortness of breath: Secondary | ICD-10-CM

## 2014-12-09 DIAGNOSIS — J9620 Acute and chronic respiratory failure, unspecified whether with hypoxia or hypercapnia: Secondary | ICD-10-CM | POA: Insufficient documentation

## 2014-12-09 DIAGNOSIS — J9811 Atelectasis: Secondary | ICD-10-CM | POA: Insufficient documentation

## 2014-12-09 LAB — BASIC METABOLIC PANEL
ANION GAP: 9 (ref 5–15)
BUN: 19 mg/dL (ref 6–23)
CHLORIDE: 102 meq/L (ref 96–112)
CO2: 25 mmol/L (ref 19–32)
CREATININE: 0.96 mg/dL (ref 0.50–1.10)
Calcium: 8.9 mg/dL (ref 8.4–10.5)
GFR calc non Af Amer: 59 mL/min — ABNORMAL LOW (ref >90)
GFR, EST AFRICAN AMERICAN: 68 mL/min — AB (ref >90)
Glucose, Bld: 161 mg/dL — ABNORMAL HIGH (ref 70–99)
Potassium: 4.2 mmol/L (ref 3.5–5.1)
SODIUM: 136 mmol/L (ref 135–145)

## 2014-12-09 MED ORDER — ALBUTEROL SULFATE (2.5 MG/3ML) 0.083% IN NEBU: 2.5000 mg | INHALATION_SOLUTION | RESPIRATORY_TRACT | Status: DC | PRN

## 2014-12-09 MED ORDER — ACETYLCYSTEINE 10 % IN SOLN
4.0000 mL | Freq: Three times a day (TID) | RESPIRATORY_TRACT | Status: DC
  Filled 2014-12-09 (×6): qty 4

## 2014-12-09 MED ORDER — BENZONATATE 100 MG PO CAPS
200.0000 mg | ORAL_CAPSULE | Freq: Three times a day (TID) | ORAL | Status: DC | PRN
  Administered 2014-12-09 – 2014-12-13 (×4): 200 mg via ORAL
  Filled 2014-12-09 (×4): qty 2

## 2014-12-09 MED ORDER — MORPHINE SULFATE 2 MG/ML IJ SOLN
1.0000 mg | INTRAMUSCULAR | Status: DC | PRN
  Administered 2014-12-12: 1 mg via INTRAVENOUS
  Filled 2014-12-09: qty 1

## 2014-12-09 MED ORDER — ARFORMOTEROL TARTRATE 15 MCG/2ML IN NEBU
15.0000 ug | INHALATION_SOLUTION | Freq: Two times a day (BID) | RESPIRATORY_TRACT | Status: DC
  Administered 2014-12-09 – 2014-12-13 (×8): 15 ug via RESPIRATORY_TRACT
  Filled 2014-12-09 (×10): qty 2

## 2014-12-09 MED ORDER — OXYCODONE HCL 5 MG PO TABS
5.0000 mg | ORAL_TABLET | Freq: Four times a day (QID) | ORAL | Status: DC | PRN
Start: 2014-12-09 — End: 2014-12-13
  Administered 2014-12-09 – 2014-12-13 (×10): 5 mg via ORAL
  Filled 2014-12-09 (×10): qty 1

## 2014-12-09 MED ORDER — IOHEXOL 350 MG/ML SOLN
100.0000 mL | Freq: Once | INTRAVENOUS | Status: AC | PRN
  Administered 2014-12-09: 100 mL via INTRAVENOUS

## 2014-12-09 MED ORDER — SODIUM CHLORIDE 0.9 % IV SOLN
INTRAVENOUS | Status: DC
  Administered 2014-12-10: 06:00:00 via INTRAVENOUS

## 2014-12-09 NOTE — Consult Note (Signed)
Name: [name] MRN: [id number] DOB: Mar 05, 1946    ADMISSION DATE:  12/06/2014 CONSULTATION DATE:  01/015/2016  REFERRING MD :  Sloan Leiter  CHIEF COMPLAINT:  SOB  BRIEF PATIENT DESCRIPTION: 69 y.o. F admitted 01/02 for asthma exacerbation.  CTA obtained 01/05 for exertional dyspnea incidentally revealed LLL collapse as well as findings concerning for centrally obstructing bronchial lesion in RLL.  PCCM consulted for recs.  SIGNIFICANT EVENTS  01/02 - admitted for asthma exacerbation 01/05 - PCCM consulted for recs regarding CTA findings  STUDIES:  CXR 01/02 >>> no acute process CTA Chest 01/05 >>>  negative for PE.  LLL collapse with R to L shift of cardiac/mediastinal structures.  Associated abrupt cut off of the RLL bronchus with vague soft tissue density in region of occlussion concerning for centrally obstructing endobronchial lesion.    HISTORY OF PRESENT ILLNESS:  [name] is a 69 y.o. F with PMH as outlined below which includes Asthma.  She presented to [facility] on 01/02 with SOB.  Symptoms began around Christmas time and have persisted.  She has a home nebulizer which she uses only when needed and states that she was able to use this and avoid having to come to the hospital.  Starting the night prior to presentation, her home meds were not giving her much relief and dyspnea worsened prompting her to come to ED for further evaluation.  She was admitted by [facility] for asthma exacerbation and started on BD's, IV steroids, and abx. On 01/05, CTA chest was obtained due to exertional dyspnea and pleuritic type chest pain.  This was negative for PE; however, did reveal LLL collapse with R to L shift of cardiac/mediastinal structures.  Additionally, there was an associated abrupt cut off of the RLL bronchus with vague soft tissue density in region of occlussion concerning for centrally obstructing endobronchial lesion. PCCM was consulted for further recommendations.  Regarding her asthma,  at home she only uses symbicort twice daily. She does have an albuterol nebulizer which she uses only as needed.  Her home med list does list albuterol inhaler; however, she denies having one.  She has never had an exacerbation this bad.  States she may use her nebulizer once or twice a month "only when I have real bad wheezing".  She has never required intubation.  She is a former smoker, quit over 20 years ago and prior to that smoked very little.  Denies any exposure to second hand smoke.  PAST MEDICAL HISTORY :   has a past medical history of Allergic rhinitis; Asthma; GERD (gastroesophageal reflux disease); Hyperlipidemia; Vocal cord dysfunction; Esophageal stricture; Colonic polyp; Osteoporosis; DJD (degenerative joint disease) of knee; Hiatal hernia; DDD (degenerative disc disease), lumbar (07/20/2011); DJD (degenerative joint disease), lumbar (07/20/2011); Diabetes mellitus, type 2; and Essential thrombocythemia (12/24/2013).  has past surgical history that includes Carpal tunnel release; Total abdominal hysterectomy; Foot surgery; and knee athroscopy (Dec.3, 2014). Prior to Admission medications   Medication Sig Start Date End Date Taking? Authorizing Provider  albuterol (PROVENTIL HFA) 108 (90 BASE) MCG/ACT inhaler Inhale 2 puffs into the lungs every 4 (four) hours as needed for wheezing or shortness of breath.    Yes Historical Provider, MD  albuterol (PROVENTIL) (2.5 MG/3ML) 0.083% nebulizer solution Take 2.5 mg by nebulization every 4 (four) hours as needed for shortness of breath. For shortness of breath. 09/11/13  Yes Collene Gobble, MD  aspirin EC 81 MG tablet Take 81 mg by mouth daily.   Yes Historical  Provider, MD  budesonide-formoterol (SYMBICORT) 160-4.5 MCG/ACT inhaler Inhale 2 puffs into the lungs 2 (two) times daily. 08/08/14  Yes Tammy S Parrett, NP  cholecalciferol (VITAMIN D) 1000 UNITS tablet Take 1,000 Units by mouth daily.   Yes Historical Provider, MD  fluticasone (FLONASE) 50  MCG/ACT nasal spray Place 1 spray into the nose 2 (two) times daily.    Yes Historical Provider, MD  hydroxyurea (HYDREA) 500 MG capsule Take 2 pills once a day with food. Patient taking differently: Take 1,000 mg by mouth 2 (two) times daily. Take 2 pills once a day with food. 07/21/14  Yes Eliezer Bottom, NP  Magnesium 200 MG TABS Take by mouth 2 (two) times daily.   Yes Historical Provider, MD  omeprazole (PRILOSEC) 20 MG capsule Take 20 mg by mouth 2 (two) times daily before a meal.   Yes Historical Provider, MD  polyethylene glycol powder (MIRALAX) powder Take 1 Container by mouth as needed.   Yes Historical Provider, MD  pravastatin (PRAVACHOL) 40 MG tablet Take 40 mg by mouth daily.   Yes Historical Provider, MD  HYDROcodone-homatropine (HYCODAN) 5-1.5 MG/5ML syrup Take 5 mLs by mouth every 6 (six) hours as needed for cough. Patient not taking: Reported on 12/06/2014 12/31/13   Collene Gobble, MD   Allergies  Allergen Reactions  . Penicillins Itching and Rash    FAMILY HISTORY:  family history includes Asthma in her brother and another family member; Cancer in her sister; Colon cancer (age of onset: 67) in her father; Heart attack in her mother. There is no history of Stomach cancer, Esophageal cancer, or Rectal cancer. SOCIAL HISTORY:  reports that she quit smoking about 19 years ago. Her smoking use included Cigarettes. She started smoking about 39 years ago. She has a 20 pack-year smoking history. She has never used smokeless tobacco. She reports that she does not drink alcohol or use illicit drugs.  REVIEW OF SYSTEMS:   All negative; except for those that are bolded, which indicate positives.  Constitutional: weight loss, weight gain, night sweats, fevers, chills, fatigue, weakness.  HEENT: headaches, sore throat, sneezing, nasal congestion, post nasal drip, difficulty swallowing, tooth/dental problems, visual complaints, visual changes, ear aches. Neuro: difficulty with speech,  weakness, numbness, ataxia. CV:  chest pain, orthopnea, PND, swelling in lower extremities, dizziness, palpitations, syncope.  Resp: cough, hemoptysis, dyspnea, wheezing. GI  heartburn, indigestion, abdominal pain, nausea, vomiting, diarrhea, constipation, change in bowel habits, loss of appetite, hematemesis, melena, hematochezia.  GU: dysuria, change in color of urine, urgency or frequency, flank pain, hematuria. MSK: joint pain or swelling, decreased range of motion. Psych: change in mood or affect, depression, anxiety, suicidal ideations, homicidal ideations. Skin: rash, itching, bruising.   SUBJECTIVE: Reports that breathing is much improved.  Denies any SOB at rest, does have some with exertion.  Still having wheezing at rest.  Has been on room air all day.  VITAL SIGNS: Temp:  [97.6 F (36.4 C)-97.7 F (36.5 C)] 97.6 F (36.4 C) (01/05 0539) Pulse Rate:  [80-90] 90 (01/05 0539) Resp:  [18-20] 20 (01/05 0539) BP: (126-129)/(73-77) 126/77 mmHg (01/05 0539) SpO2:  [99 %-100 %] 99 % (01/05 0539)  PHYSICAL EXAMINATION: General: AA female, resting in bed visiting with family members, in NAD. Neuro: A&O x 3, non-focal.  HEENT: [place]/AT. PERRL, sclerae anicteric. Cardiovascular: RRR, no M/R/G.  Lungs: Respirations even and unlabored.  Diffuse wheezing throughout.  BS diminished LLL with dullness to percussion. Abdomen: BS x 4, soft, NT/ND.  Musculoskeletal: No gross deformities, no edema.  Skin: Intact, warm, no rashes.   Recent Labs Lab 12/06/14 1146 12/07/14 0530 12/09/14 1200  NA 139 137 136  K 4.0 4.2 4.2  CL 106 104 102  CO2 26 21 25   BUN 15 15 19   CREATININE 0.95 1.04 0.96  GLUCOSE 112* 138* 161*    Recent Labs Lab 12/06/14 1146 12/07/14 0530  HGB 12.2 11.9*  HCT 37.1 36.5  WBC 7.3 10.9*  PLT 349 438*   Ct Angio Chest Pe W/cm &/or Wo Cm  12/09/2014   CLINICAL DATA:  69 year old female with shortness of breath and stroke  EXAM: CT ANGIOGRAPHY CHEST WITH  CONTRAST  TECHNIQUE: Multidetector CT imaging of the chest was performed using the standard protocol during bolus administration of intravenous contrast. Multiplanar CT image reconstructions and MIPs were obtained to evaluate the vascular anatomy.  CONTRAST:  164mL OMNIPAQUE IOHEXOL 350 MG/ML SOLN  COMPARISON:  Concurrently obtained chest x-ray stress that chest x-ray 12/06/2014; Prior chest CT 06/19/2008  FINDINGS: Mediastinum: Unremarkable CT appearance of the thyroid gland. No suspicious mediastinal or hilar adenopathy. Soft tissue attenuation density is noted in the left infrahilar region at the location of the occluded/obliterated left lower lobar bronchus. Moderately large hiatal hernia.  Heart/Vascular: Adequate opacification of the pulmonary arteries to the proximal subsegmental level. Evaluation beyond the segmental levels limited by mild respiratory motion. There is no evidence of focal central filling defect to suggest acute pulmonary embolus. The heart is within normal limits for size. No pericardial effusion. Trace calcifications present along the course of the left anterior descending coronary artery. No aortic aneurysm. Conventional 3 vessel arch. The origin of the left common carotid artery is not well seen secondary to streak artifact from a contrast bolus in the left subclavian vein.  Lungs/Pleura: Abrupt cut off of the left lower lobe bronchus with complete atelectasis of the left lower lobe. There is associated right to left shift of the cardiac and mediastinal structures. Compensatory hyperexpansion is noted of the left upper lobe. Trace paraseptal emphysema is present in the upper lobes. There are a few tiny scattered subpleural nodular densities likely reflecting subpleural lymph nodes. No suspicious pulmonary nodule or mass. No focal airspace consolidation. Mild lower lobe bronchial wall thickening.  Bones/Soft Tissues: No acute fracture or aggressive appearing lytic or blastic osseous  lesion.  Upper Abdomen: Visualized upper abdominal organs are unremarkable.  Review of the MIP images confirms the above findings.  IMPRESSION: 1. Negative for acute pulmonary embolism. 2. Left lower lobar collapse with right to left shift of the cardiac and mediastinal structures. There is associated abrupt cut off of the right lower lobar bronchus with vague soft tissue density in the region of the occlusion concerning for possible centrally obstructing endobronchial lesion. Additional considerations include inspissated secretions. Recommend further evaluation with bronchoscopy to assess for endobronchial / centrally obstructing primary bronchogenic carcinoma. 3. Atherosclerosis including calcifications within the coronary arteries. 4. Mild biapical paraseptal emphysema. 5. Moderately large hiatal hernia.   Electronically Signed   By: Jacqulynn Cadet M.D.   On: 12/09/2014 17:25    ASSESSMENT / PLAN:  Asthma Exacerbation Acute hypoxic respiratory failure - resolved LLL segmental collapse - this is a new finding since admission CXR 01/02 and likely represents mucus plug Remote smoker Recs: BD regimen / frequency adjusted and LABA added (aformoterol). Continue solumedrol. Complete 5 days abx  Incentive Spirometry / Pulmonary hygiene. Recheck 2V CXR in AM - if LLL does not re-expand with  noninvasive measures, consider FOB   Montey Hora, [facility] Pgr: [phone]  or [phone] 12/09/2014, 7:03 PM   Merton Border, MD ; [facility] service Mobile [phone].  After 5:30 PM or weekends, call [phone]

## 2014-12-09 NOTE — Progress Notes (Signed)
PATIENT DETAILS Name: [name] Age: 69 y.o. Sex: female Date of Birth: [birth year] Admit Date: 12/06/2014 Admitting Physician Costin Karlyne Greenspan, MD SFK:[email or web address],BSWHQ, MD  Subjective: Feels better-but still not back to baseline.Complains of pleuritic chest pain today  Assessment/Plan: Active Problems:   Asthma exacerbation:significantly improved but still with some wheezing/SOB on exertion, claims still not back to baseline.Suspect triggered by a viral syndrome-but Influenza PCR neg.CXR neg for PNA. Moving air well, lungs with rhonchi. Continue Solumedrol, nebulized bronchodilators and empiric Levaquin. Will check CTA given that she complains of exertional dyspnea with pleuritic chest pain-with slow clinical improvement.    Diabetes:diet controlled. A1C 5.9    Hyperlipidemia:continue Statin    Essential thrombocythemia:platelet count stable, continue with Hydrea.     Hx of Sickle cell trait:continue Folate  Disposition: Remain inpatient-home on 1/5 if better  Antibiotics:  See below    Anti-infectives    Start     Dose/Rate Route Frequency Ordered Stop   12/07/14 1000  levofloxacin (LEVAQUIN) tablet 500 mg     500 mg Oral Daily 12/06/14 1337     12/06/14 1330  levofloxacin (LEVAQUIN) IVPB 750 mg     750 mg100 mL/hr over 90 Minutes Intravenous  Once 12/06/14 1321 12/06/14 1507      DVT Prophylaxis: Prophylactic Lovenox  Code Status: Full code  Family Communication None at bedside, patient awake, alert and understanding of above plan  Procedures:  None  CONSULTS:  None  MEDICATIONS: Scheduled Meds: . aspirin EC  81 mg Oral Daily  . budesonide (PULMICORT) nebulizer solution  0.25 mg Nebulization BID  . cholecalciferol  1,000 Units Oral Daily  . enoxaparin (LOVENOX) injection  40 mg Subcutaneous Q24H  . fluticasone  1 spray Each Nare BID  . folic acid  1 mg Oral Daily  . guaiFENesin  600 mg Oral BID  . hydroxyurea  1,000 mg Oral BID  .  ipratropium-albuterol  3 mL Nebulization Q6H  . levofloxacin  500 mg Oral Daily  . methylPREDNISolone (SOLU-MEDROL) injection  40 mg Intravenous 3 times per day  . pantoprazole  40 mg Oral Q1200  . pravastatin  40 mg Oral Daily   Continuous Infusions:  PRN Meds:.acetaminophen **OR** acetaminophen, albuterol, alum & mag hydroxide-simeth, bisacodyl, HYDROcodone-homatropine, ondansetron **OR** ondansetron (ZOFRAN) IV, polyethylene glycol    PHYSICAL EXAM: Vital signs in last 24 hours: Filed Vitals:   12/08/14 1924 12/08/14 2151 12/09/14 0108 12/09/14 0539  BP:  129/73  126/77  Pulse:  80  90  Temp:  97.7 F (36.5 C)  97.6 F (36.4 C)  TempSrc:  Oral  Oral  Resp:  18  20  Height:      Weight:      SpO2: 100% 100% 100% 99%    Weight change:  Filed Weights   12/06/14 2300  Weight: 63.504 kg (140 lb)   Body mass index is 24.81 kg/(m^2).   Gen Exam: Awake and alert with clear speech.   Neck: Supple, No JVD.   Chest: Good air movement bilaterally,  Rhonchi present CVS: S1 S2 Regular, no murmurs.  Abdomen: soft, BS +, non tender, non distended.  Extremities: no edema, lower extremities warm to touch. Neurologic: Non Focal.   Skin: No Rash.   Wounds: N/A.   Intake/Output from previous day:  Intake/Output Summary (Last 24 hours) at 12/09/14 1403 Last data filed at 12/09/14 0900  Gross per 24 hour  Intake    480 ml  Output      0 ml  Net    480 ml     LAB RESULTS: CBC  Recent Labs Lab 12/06/14 1146 12/07/14 0530  WBC 7.3 10.9*  HGB 12.2 11.9*  HCT 37.1 36.5  PLT 349 438*  MCV 80.5 78.7  MCH 26.5 25.6*  MCHC 32.9 32.6  RDW 18.5* 18.3*  LYMPHSABS 1.2  --   MONOABS 0.3  --   EOSABS 0.1  --   BASOSABS 0.0  --     Chemistries   Recent Labs Lab 12/06/14 1146 12/07/14 0530 12/09/14 1200  NA 139 137 136  K 4.0 4.2 4.2  CL 106 104 102  CO2 26 21 25   GLUCOSE 112* 138* 161*  BUN 15 15 19   CREATININE 0.95 1.04 0.96  CALCIUM 9.0 9.0 8.9    CBG: No  results for input(s): GLUCAP in the last 168 hours.  GFR Estimated Creatinine Clearance: 49.6 mL/min (by C-G formula based on Cr of 0.96).  Coagulation profile No results for input(s): INR, PROTIME in the last 168 hours.  Cardiac Enzymes No results for input(s): CKMB, TROPONINI, MYOGLOBIN in the last 168 hours.  Invalid input(s): CK  Invalid input(s): POCBNP No results for input(s): DDIMER in the last 72 hours.  Recent Labs  12/07/14 0530  HGBA1C 5.6   No results for input(s): CHOL, HDL, LDLCALC, TRIG, CHOLHDL, LDLDIRECT in the last 72 hours. No results for input(s): TSH, T4TOTAL, T3FREE, THYROIDAB in the last 72 hours.  Invalid input(s): FREET3 No results for input(s): VITAMINB12, FOLATE, FERRITIN, TIBC, IRON, RETICCTPCT in the last 72 hours. No results for input(s): LIPASE, AMYLASE in the last 72 hours.  Urine Studies No results for input(s): UHGB, CRYS in the last 72 hours.  Invalid input(s): UACOL, UAPR, USPG, UPH, UTP, UGL, UKET, UBIL, UNIT, UROB, ULEU, UEPI, UWBC, URBC, UBAC, CAST, UCOM, BILUA  MICROBIOLOGY: No results found for this or any previous visit (from the past 240 hour(s)).  RADIOLOGY STUDIES/RESULTS: Dg Chest 2 View  12/06/2014   CLINICAL DATA:  Shortness of breath, cough and wheezing for 2 days, history of asthma  EXAM: CHEST - 2 VIEW  COMPARISON:  07/31/2013  FINDINGS: Lungs are clear. Heart size and mediastinal contours are within normal limits. No effusion.  Atheromatous aortic arch. Visualized skeletal structures are unremarkable.  IMPRESSION: No acute cardiopulmonary disease.   Electronically Signed   By: Arne Cleveland M.D.   On: 12/06/2014 10:38    Oren Binet, MD  [facility] Pager:[phone]  If 7PM-7AM, please contact night-coverage [email or web address] Password [email or web address] 12/09/2014, 2:03 PM   LOS: 3 days

## 2014-12-10 ENCOUNTER — Inpatient Hospital Stay (HOSPITAL_COMMUNITY): Payer: Medicare Other

## 2014-12-10 NOTE — Progress Notes (Signed)
PATIENT DETAILS Name: [name] Age: 69 y.o. Sex: female Date of Birth: 1946/10/31 Admit Date: 12/06/2014 Admitting Physician Costin Karlyne Greenspan, MD OJJ:[id number],JIRCV, MD  Subjective: Feels better-but still not back to baseline.Complains of pleuritic chest pain today  Assessment/Plan: Active Problems:   Asthma exacerbation:Although improved but still with some wheezing. Continue Solumedrol, nebs and empriic Levaquin. CTA chest negative for PE but shows LLL segmental collapse. See below.Suspect triggered by a viral syndrome-but Influenza PCR neg.CXR neg for PNA.   LLL segmental collapse:started on Incentive spirometry, mucomyst nebs, current suspicion for a mucus plug. If no improvement, may require a FOB. PCCM consulted    Diabetes:diet controlled. A1C 5.9    Hyperlipidemia:continue Statin    Essential thrombocythemia:platelet count stable, continue with Hydrea.     Hx of Sickle cell trait:continue Folate  Disposition: Remain inpatient  Antibiotics:  See below    Anti-infectives    Start     Dose/Rate Route Frequency Ordered Stop   12/07/14 1000  levofloxacin (LEVAQUIN) tablet 500 mg     500 mg Oral Daily 12/06/14 1337     12/06/14 1330  levofloxacin (LEVAQUIN) IVPB 750 mg     750 mg100 mL/hr over 90 Minutes Intravenous  Once 12/06/14 1321 12/06/14 1507      DVT Prophylaxis: Prophylactic Lovenox  Code Status: Full code  Family Communication None at bedside, patient awake, alert and understanding of above plan  Procedures:  None  CONSULTS:  None  MEDICATIONS: Scheduled Meds: . acetylcysteine  4 mL Nebulization TID  . arformoterol  15 mcg Nebulization BID  . aspirin EC  81 mg Oral Daily  . budesonide (PULMICORT) nebulizer solution  0.25 mg Nebulization BID  . cholecalciferol  1,000 Units Oral Daily  . enoxaparin (LOVENOX) injection  40 mg Subcutaneous Q24H  . fluticasone  1 spray Each Nare BID  . folic acid  1 mg Oral Daily  .  guaiFENesin  600 mg Oral BID  . hydroxyurea  1,000 mg Oral BID  . ipratropium-albuterol  3 mL Nebulization Q6H  . levofloxacin  500 mg Oral Daily  . methylPREDNISolone (SOLU-MEDROL) injection  40 mg Intravenous 3 times per day  . pantoprazole  40 mg Oral Q1200  . pravastatin  40 mg Oral Daily   Continuous Infusions: . sodium chloride 75 mL/hr at 12/10/14 0548   PRN Meds:.acetaminophen **OR** acetaminophen, albuterol, alum & mag hydroxide-simeth, benzonatate, bisacodyl, HYDROcodone-homatropine, morphine injection, ondansetron **OR** ondansetron (ZOFRAN) IV, oxyCODONE, polyethylene glycol    PHYSICAL EXAM: Vital signs in last 24 hours: Filed Vitals:   12/09/14 0539 12/09/14 2154 12/10/14 0150 12/10/14 0545  BP: 126/77 136/73  143/75  Pulse: 90 79  78  Temp: 97.6 F (36.4 C) 97.5 F (36.4 C)  97.8 F (36.6 C)  TempSrc: Oral Oral  Oral  Resp: 20   18  Height:      Weight:      SpO2: 99% 100% 97% 97%    Weight change:  Filed Weights   12/06/14 2300  Weight: 63.504 kg (140 lb)   Body mass index is 24.81 kg/(m^2).   Gen Exam: Awake and alert with clear speech.   Neck: Supple, No JVD.   Chest: Good air movement bilaterally, Still has rhonchi present CVS: S1 S2 Regular, no murmurs.  Abdomen: soft, BS +, non tender, non distended.  Extremities: no edema, lower extremities warm to touch. Neurologic: Non Focal.   Skin: No Rash.   Wounds: N/A.  Intake/Output from previous day:  Intake/Output Summary (Last 24 hours) at 12/10/14 0831 Last data filed at 12/10/14 0548  Gross per 24 hour  Intake   1665 ml  Output      0 ml  Net   1665 ml     LAB RESULTS: CBC  Recent Labs Lab 12/06/14 1146 12/07/14 0530  WBC 7.3 10.9*  HGB 12.2 11.9*  HCT 37.1 36.5  PLT 349 438*  MCV 80.5 78.7  MCH 26.5 25.6*  MCHC 32.9 32.6  RDW 18.5* 18.3*  LYMPHSABS 1.2  --   MONOABS 0.3  --   EOSABS 0.1  --   BASOSABS 0.0  --     Chemistries   Recent Labs Lab 12/06/14 1146  12/07/14 0530 12/09/14 1200  NA 139 137 136  K 4.0 4.2 4.2  CL 106 104 102  CO2 26 21 25   GLUCOSE 112* 138* 161*  BUN 15 15 19   CREATININE 0.95 1.04 0.96  CALCIUM 9.0 9.0 8.9    CBG: No results for input(s): GLUCAP in the last 168 hours.  GFR Estimated Creatinine Clearance: 49.6 mL/min (by C-G formula based on Cr of 0.96).  Coagulation profile No results for input(s): INR, PROTIME in the last 168 hours.  Cardiac Enzymes No results for input(s): CKMB, TROPONINI, MYOGLOBIN in the last 168 hours.  Invalid input(s): CK  Invalid input(s): POCBNP No results for input(s): DDIMER in the last 72 hours. No results for input(s): HGBA1C in the last 72 hours. No results for input(s): CHOL, HDL, LDLCALC, TRIG, CHOLHDL, LDLDIRECT in the last 72 hours. No results for input(s): TSH, T4TOTAL, T3FREE, THYROIDAB in the last 72 hours.  Invalid input(s): FREET3 No results for input(s): VITAMINB12, FOLATE, FERRITIN, TIBC, IRON, RETICCTPCT in the last 72 hours. No results for input(s): LIPASE, AMYLASE in the last 72 hours.  Urine Studies No results for input(s): UHGB, CRYS in the last 72 hours.  Invalid input(s): UACOL, UAPR, USPG, UPH, UTP, UGL, UKET, UBIL, UNIT, UROB, ULEU, UEPI, UWBC, URBC, UBAC, CAST, UCOM, BILUA  MICROBIOLOGY: No results found for this or any previous visit (from the past 240 hour(s)).  RADIOLOGY STUDIES/RESULTS: Dg Chest 2 View  12/10/2014   CLINICAL DATA:  Chest pain, cough. Asthma with shortness of breath and wheezing.  EXAM: CHEST  2 VIEW  COMPARISON:  CT chest 12/09/2014 and Chest radiograph 12/06/2014.  FINDINGS: Trachea is midline. Heart size is accentuated by somewhat low lung volumes. There is collapse/consolidation in the left lower lobe with elevation of the left hemidiaphragm. Lungs are otherwise clear. No pleural fluid. Old right rib fracture.  IMPRESSION: Persistent left lower lobe collapse/ consolidation. Centrally obstructing lesion can have this  appearance. Please refer to cross-sectional imaging performed 12/09/2014 for further details and recommendations.   Electronically Signed   By: Lorin Picket M.D.   On: 12/10/2014 08:14   Dg Chest 2 View  12/06/2014   CLINICAL DATA:  Shortness of breath, cough and wheezing for 2 days, history of asthma  EXAM: CHEST - 2 VIEW  COMPARISON:  07/31/2013  FINDINGS: Lungs are clear. Heart size and mediastinal contours are within normal limits. No effusion.  Atheromatous aortic arch. Visualized skeletal structures are unremarkable.  IMPRESSION: No acute cardiopulmonary disease.   Electronically Signed   By: Arne Cleveland M.D.   On: 12/06/2014 10:38   Ct Angio Chest Pe W/cm &/or Wo Cm  12/09/2014   CLINICAL DATA:  69 year old female with shortness of breath and stroke  EXAM: CT  ANGIOGRAPHY CHEST WITH CONTRAST  TECHNIQUE: Multidetector CT imaging of the chest was performed using the standard protocol during bolus administration of intravenous contrast. Multiplanar CT image reconstructions and MIPs were obtained to evaluate the vascular anatomy.  CONTRAST:  17mL OMNIPAQUE IOHEXOL 350 MG/ML SOLN  COMPARISON:  Concurrently obtained chest x-ray stress that chest x-ray 12/06/2014; Prior chest CT 06/19/2008  FINDINGS: Mediastinum: Unremarkable CT appearance of the thyroid gland. No suspicious mediastinal or hilar adenopathy. Soft tissue attenuation density is noted in the left infrahilar region at the location of the occluded/obliterated left lower lobar bronchus. Moderately large hiatal hernia.  Heart/Vascular: Adequate opacification of the pulmonary arteries to the proximal subsegmental level. Evaluation beyond the segmental levels limited by mild respiratory motion. There is no evidence of focal central filling defect to suggest acute pulmonary embolus. The heart is within normal limits for size. No pericardial effusion. Trace calcifications present along the course of the left anterior descending coronary artery. No  aortic aneurysm. Conventional 3 vessel arch. The origin of the left common carotid artery is not well seen secondary to streak artifact from a contrast bolus in the left subclavian vein.  Lungs/Pleura: Abrupt cut off of the left lower lobe bronchus with complete atelectasis of the left lower lobe. There is associated right to left shift of the cardiac and mediastinal structures. Compensatory hyperexpansion is noted of the left upper lobe. Trace paraseptal emphysema is present in the upper lobes. There are a few tiny scattered subpleural nodular densities likely reflecting subpleural lymph nodes. No suspicious pulmonary nodule or mass. No focal airspace consolidation. Mild lower lobe bronchial wall thickening.  Bones/Soft Tissues: No acute fracture or aggressive appearing lytic or blastic osseous lesion.  Upper Abdomen: Visualized upper abdominal organs are unremarkable.  Review of the MIP images confirms the above findings.  IMPRESSION: 1. Negative for acute pulmonary embolism. 2. Left lower lobar collapse with right to left shift of the cardiac and mediastinal structures. There is associated abrupt cut off of the right lower lobar bronchus with vague soft tissue density in the region of the occlusion concerning for possible centrally obstructing endobronchial lesion. Additional considerations include inspissated secretions. Recommend further evaluation with bronchoscopy to assess for endobronchial / centrally obstructing primary bronchogenic carcinoma. 3. Atherosclerosis including calcifications within the coronary arteries. 4. Mild biapical paraseptal emphysema. 5. Moderately large hiatal hernia.   Electronically Signed   By: Jacqulynn Cadet M.D.   On: 12/09/2014 17:25    Oren Binet, MD  [facility] Pager:[phone]  If 7PM-7AM, please contact night-coverage [email or web address] Password [email or web address] 12/10/2014, 8:31 AM   LOS: 4 days

## 2014-12-10 NOTE — Progress Notes (Signed)
Name: [name] MRN: [id number] DOB: [birth year]    ADMISSION DATE:  12/06/2014 CONSULTATION DATE:  01/015/2016  REFERRING MD :  Sloan Leiter  CHIEF COMPLAINT:  SOB  BRIEF PATIENT DESCRIPTION:  69 yo female former smoker admitted with dyspnea, productive cough, wheezing from asthma exacerbation.  Found to have LLL ATX with concern for obstructing lesion on CT chest and PCCM consulted.  She is followed by Dr. Lamonte Sakai in pulmonary office for asthma/VCD.   SIGNIFICANT EVENTS  01/02 - admitted for asthma exacerbation 01/05 - PCCM consulted for recs regarding CTA findings  STUDIES:  CTA Chest 01/05 >>> LLL collapse with ? Obstructing lesion  SUBJECTIVE: Still feels short of breath.  C/o Lt sided pleuritic chest pain.  Continues to have cough, wheeze.  VITAL SIGNS: Temp:  [97.5 F (36.4 C)-98.3 F (36.8 C)] 98.3 F (36.8 C) (01/06 1344) Pulse Rate:  [78-93] 93 (01/06 1344) Resp:  [18] 18 (01/06 1344) BP: (136-143)/(65-75) 136/65 mmHg (01/06 1344) SpO2:  [95 %-100 %] 100 % (01/06 1344)  PHYSICAL EXAMINATION: General: pleasant Neuro: normal strength HEENT: no stridor Cardiovascular: regular Lungs: b/l expiratory wheeze Abdomen: soft, non tender Musculoskeletal: No edema Skin: no rashes  CMP Latest Ref Rng 12/09/2014 12/07/2014 12/06/2014  Glucose 70 - 99 mg/dL 161(H) 138(H) 112(H)  BUN 6 - 23 mg/dL 19 15 15   Creatinine 0.50 - 1.10 mg/dL 0.96 1.04 0.95  Sodium 135 - 145 mmol/L 136 137 139  Potassium 3.5 - 5.1 mmol/L 4.2 4.2 4.0  Chloride 96 - 112 mEq/L 102 104 106  CO2 19 - 32 mmol/L 25 21 26   Calcium 8.4 - 10.5 mg/dL 8.9 9.0 9.0  Total Protein 6.0 - 8.3 g/dL - - 6.5  Albumin 3.3 - 5.5 g/dL - - -  Total Bilirubin 0.3 - 1.2 mg/dL - - 0.3  Alkaline Phos 39 - 117 U/L - - 65  AST 0 - 37 U/L - - 17  ALT 0 - 35 U/L - - 12    CBC Latest Ref Rng 12/07/2014 12/06/2014 10/21/2014  WBC 4.0 - 10.5 K/uL 10.9(H) 7.3 6.6  Hemoglobin 12.0 - 15.0 g/dL 11.9(L) 12.2 12.6  Hematocrit 36.0 -  46.0 % 36.5 37.1 38.6  Platelets 150 - 400 K/uL 438(H) 349 356    Dg Chest 2 View  12/10/2014   CLINICAL DATA:  Chest pain, cough. Asthma with shortness of breath and wheezing.  EXAM: CHEST  2 VIEW  COMPARISON:  CT chest 12/09/2014 and Chest radiograph 12/06/2014.  FINDINGS: Trachea is midline. Heart size is accentuated by somewhat low lung volumes. There is collapse/consolidation in the left lower lobe with elevation of the left hemidiaphragm. Lungs are otherwise clear. No pleural fluid. Old right rib fracture.  IMPRESSION: Persistent left lower lobe collapse/ consolidation. Centrally obstructing lesion can have this appearance. Please refer to cross-sectional imaging performed 12/09/2014 for further details and recommendations.   Electronically Signed   By: Lorin Picket M.D.   On: 12/10/2014 08:14   Ct Angio Chest Pe W/cm &/or Wo Cm  12/10/2014   ADDENDUM REPORT: 12/10/2014 11:06  ADDENDUM: There is a left right inversion error in the impression of the initial report. Conclusion #2 of the impression correctly states that there is left lower lobar collapse. However, it then goes on to read that this is due to abrupt cut off of the right lower lobe bronchus. This should read the LEFT lower lobe bronchus as described in the findings.  Signed,  Criselda Peaches, MD  [facility]  [facility]   Electronically Signed   By: Jacqulynn Cadet M.D.   On: 12/10/2014 11:06   12/10/2014   CLINICAL DATA:  69 year old female with shortness of breath and stroke  EXAM: CT ANGIOGRAPHY CHEST WITH CONTRAST  TECHNIQUE: Multidetector CT imaging of the chest was performed using the standard protocol during bolus administration of intravenous contrast. Multiplanar CT image reconstructions and MIPs were obtained to evaluate the vascular anatomy.  CONTRAST:  137mL OMNIPAQUE IOHEXOL 350 MG/ML SOLN  COMPARISON:  Concurrently obtained chest x-ray stress that chest x-ray 12/06/2014;  Prior chest CT 06/19/2008  FINDINGS: Mediastinum: Unremarkable CT appearance of the thyroid gland. No suspicious mediastinal or hilar adenopathy. Soft tissue attenuation density is noted in the left infrahilar region at the location of the occluded/obliterated left lower lobar bronchus. Moderately large hiatal hernia.  Heart/Vascular: Adequate opacification of the pulmonary arteries to the proximal subsegmental level. Evaluation beyond the segmental levels limited by mild respiratory motion. There is no evidence of focal central filling defect to suggest acute pulmonary embolus. The heart is within normal limits for size. No pericardial effusion. Trace calcifications present along the course of the left anterior descending coronary artery. No aortic aneurysm. Conventional 3 vessel arch. The origin of the left common carotid artery is not well seen secondary to streak artifact from a contrast bolus in the left subclavian vein.  Lungs/Pleura: Abrupt cut off of the left lower lobe bronchus with complete atelectasis of the left lower lobe. There is associated right to left shift of the cardiac and mediastinal structures. Compensatory hyperexpansion is noted of the left upper lobe. Trace paraseptal emphysema is present in the upper lobes. There are a few tiny scattered subpleural nodular densities likely reflecting subpleural lymph nodes. No suspicious pulmonary nodule or mass. No focal airspace consolidation. Mild lower lobe bronchial wall thickening.  Bones/Soft Tissues: No acute fracture or aggressive appearing lytic or blastic osseous lesion.  Upper Abdomen: Visualized upper abdominal organs are unremarkable.  Review of the MIP images confirms the above findings.  IMPRESSION: 1. Negative for acute pulmonary embolism. 2. Left lower lobar collapse with right to left shift of the cardiac and mediastinal structures. There is associated abrupt cut off of the right lower lobar bronchus with vague soft tissue density in the  region of the occlusion concerning for possible centrally obstructing endobronchial lesion. Additional considerations include inspissated secretions. Recommend further evaluation with bronchoscopy to assess for endobronchial / centrally obstructing primary bronchogenic carcinoma. 3. Atherosclerosis including calcifications within the coronary arteries. 4. Mild biapical paraseptal emphysema. 5. Moderately large hiatal hernia.  Electronically Signed: By: Jacqulynn Cadet M.D. On: 12/09/2014 17:25     ASSESSMENT / PLAN:  Acute hypoxic respiratory failure 2nd to asthma exacerbation with hx of VCD. Plan: - continue brovana, pulmicort, duoneb - d/c mucomyst - day of levaquin - continue solumedrol 40 mg q8h  Lt lower lung ATX with possible obstructing lesion. Plan: - will need bronchoscopy when asthma symptoms improved   Chesley Mires, MD [place] 12/10/2014, 2:11 PM Pager:  [phone] After 3pm call: [phone]

## 2014-12-11 DIAGNOSIS — J9819 Other pulmonary collapse: Secondary | ICD-10-CM

## 2014-12-11 DIAGNOSIS — J849 Interstitial pulmonary disease, unspecified: Secondary | ICD-10-CM | POA: Insufficient documentation

## 2014-12-11 MED ORDER — PHENYLEPHRINE HCL 0.25 % NA SOLN
1.0000 | Freq: Four times a day (QID) | NASAL | Status: DC | PRN
  Filled 2014-12-11: qty 15

## 2014-12-11 MED ORDER — LIDOCAINE HCL 2 % EX GEL
1.0000 "application " | Freq: Once | CUTANEOUS | Status: DC
  Filled 2014-12-11: qty 5

## 2014-12-11 MED ORDER — BUTAMBEN-TETRACAINE-BENZOCAINE 2-2-14 % EX AERO
1.0000 | INHALATION_SPRAY | Freq: Once | CUTANEOUS | Status: DC
  Filled 2014-12-11: qty 56

## 2014-12-11 NOTE — Care Management Note (Signed)
  Page 1 of 1   12/12/2014     8:16:59 AM CARE MANAGEMENT NOTE 12/12/2014  Patient:  [name], [name]   Account Number:  [id number]  Date Initiated:  12/11/2014  Documentation initiated by:  Magdalen Spatz  Subjective/Objective Assessment:     Action/Plan:   Anticipated DC Date:  12/13/2014   Anticipated DC Plan:  HOME/SELF CARE         Choice offered to / List presented to:             Status of service:   Medicare Important Message given?  YES (If response is "NO", the following Medicare IM given date fields will be blank) Date Medicare IM given:  12/11/2014 Medicare IM given by:  Magdalen Spatz Date Additional Medicare IM given:  12/12/2014 Additional Medicare IM given by:  Magdalen Spatz  Discharge Disposition:    Per UR Regulation:  Reviewed for med. necessity/level of care/duration of stay  If discussed at [place] of Stay Meetings, dates discussed:   12/11/2014    Comments:

## 2014-12-11 NOTE — Progress Notes (Signed)
PATIENT DETAILS Name: [name] Age: 69 y.o. Sex: female Date of Birth: 1946/08/11 Admit Date: 12/06/2014 Admitting Physician Costin Karlyne Greenspan, MD EHU:[email or web address],YIFOY, MD  Brief summary:  Patient is a 69 year old African-American female with a history of bronchial asthma who was admitted on 1/2 for acute asthma exacerbation. She was started on steroids, nebulized bronchodilators and empiric Levaquin treatment. Patient made some improvement, however continued to have bronchospasm. Subsequently a CT chest was done which revealed a left lower lobe segmental collapse. The CCM was consulted, plans are for flexible bronchoscopy on 12/12/14.  Subjective: Overall better, but still not back to baseline. Still complaining of wheezing.  Assessment/Plan: Active Problems:   Asthma exacerbation:Although improved but still with some wheezing. Continue Solumedrol, nebs and empriic Levaquin. CTA chest negative for PE but shows LLL segmental collapse. See below.Suspect triggered by a viral syndrome-but Influenza PCR neg.CXR neg for PNA.   LLL segmental collapse:started on Incentive spirometry, mucomyst nebs-now discontinued by PCCM, current suspicion for a mucus plug. Since  no improvement, plans are for bronchoscopy in a.m. PCCM following.    Diabetes:diet controlled. A1C 5.9    Hyperlipidemia:continue Statin    Essential thrombocythemia:platelet count stable, continue with Hydrea.     Hx of Sickle cell trait:continue Folate  Disposition: Remain inpatient  Antibiotics:  See below    Anti-infectives    Start     Dose/Rate Route Frequency Ordered Stop   12/07/14 1000  levofloxacin (LEVAQUIN) tablet 500 mg     500 mg Oral Daily 12/06/14 1337     12/06/14 1330  levofloxacin (LEVAQUIN) IVPB 750 mg     750 mg100 mL/hr over 90 Minutes Intravenous  Once 12/06/14 1321 12/06/14 1507      DVT Prophylaxis: Prophylactic Lovenox  Code Status: Full code  Family Communication None  at bedside, patient awake, alert and understanding of above plan  Procedures:  None  CONSULTS:  None  MEDICATIONS: Scheduled Meds: . arformoterol  15 mcg Nebulization BID  . aspirin EC  81 mg Oral Daily  . budesonide (PULMICORT) nebulizer solution  0.25 mg Nebulization BID  . cholecalciferol  1,000 Units Oral Daily  . fluticasone  1 spray Each Nare BID  . folic acid  1 mg Oral Daily  . guaiFENesin  600 mg Oral BID  . hydroxyurea  1,000 mg Oral BID  . ipratropium-albuterol  3 mL Nebulization Q6H  . levofloxacin  500 mg Oral Daily  . methylPREDNISolone (SOLU-MEDROL) injection  40 mg Intravenous 3 times per day  . pantoprazole  40 mg Oral Q1200  . pravastatin  40 mg Oral Daily   Continuous Infusions:   PRN Meds:.acetaminophen **OR** acetaminophen, albuterol, alum & mag hydroxide-simeth, benzonatate, bisacodyl, HYDROcodone-homatropine, morphine injection, ondansetron **OR** ondansetron (ZOFRAN) IV, oxyCODONE, polyethylene glycol    PHYSICAL EXAM: Vital signs in last 24 hours: Filed Vitals:   12/10/14 2150 12/11/14 0251 12/11/14 0600 12/11/14 0836  BP:   130/67   Pulse:   75   Temp:   97.9 F (36.6 C)   TempSrc:   Oral   Resp:   19   Height:      Weight:      SpO2: 99% 98% 100% 94%    Weight change:  Filed Weights   12/06/14 2300  Weight: 63.504 kg (140 lb)   Body mass index is 24.81 kg/(m^2).   Gen Exam: Awake and alert with clear speech.  Not in any distress. Neck: Supple, No JVD.  Chest: Good air movement bilaterally, bilateral rhonchi all over. CVS: S1 S2 Regular, no murmurs.  Abdomen: soft, BS +, non tender, non distended.  Extremities: no edema, lower extremities warm to touch. Neurologic: Non Focal.   Skin: No Rash.   Wounds: N/A.   Intake/Output from previous day:  Intake/Output Summary (Last 24 hours) at 12/11/14 1231 Last data filed at 12/11/14 1012  Gross per 24 hour  Intake   1200 ml  Output      0 ml  Net   1200 ml     LAB  RESULTS: CBC  Recent Labs Lab 12/06/14 1146 12/07/14 0530  WBC 7.3 10.9*  HGB 12.2 11.9*  HCT 37.1 36.5  PLT 349 438*  MCV 80.5 78.7  MCH 26.5 25.6*  MCHC 32.9 32.6  RDW 18.5* 18.3*  LYMPHSABS 1.2  --   MONOABS 0.3  --   EOSABS 0.1  --   BASOSABS 0.0  --     Chemistries   Recent Labs Lab 12/06/14 1146 12/07/14 0530 12/09/14 1200  NA 139 137 136  K 4.0 4.2 4.2  CL 106 104 102  CO2 26 21 25   GLUCOSE 112* 138* 161*  BUN 15 15 19   CREATININE 0.95 1.04 0.96  CALCIUM 9.0 9.0 8.9    CBG: No results for input(s): GLUCAP in the last 168 hours.  GFR Estimated Creatinine Clearance: 49.6 mL/min (by C-G formula based on Cr of 0.96).  Coagulation profile No results for input(s): INR, PROTIME in the last 168 hours.  Cardiac Enzymes No results for input(s): CKMB, TROPONINI, MYOGLOBIN in the last 168 hours.  Invalid input(s): CK  Invalid input(s): POCBNP No results for input(s): DDIMER in the last 72 hours. No results for input(s): HGBA1C in the last 72 hours. No results for input(s): CHOL, HDL, LDLCALC, TRIG, CHOLHDL, LDLDIRECT in the last 72 hours. No results for input(s): TSH, T4TOTAL, T3FREE, THYROIDAB in the last 72 hours.  Invalid input(s): FREET3 No results for input(s): VITAMINB12, FOLATE, FERRITIN, TIBC, IRON, RETICCTPCT in the last 72 hours. No results for input(s): LIPASE, AMYLASE in the last 72 hours.  Urine Studies No results for input(s): UHGB, CRYS in the last 72 hours.  Invalid input(s): UACOL, UAPR, USPG, UPH, UTP, UGL, UKET, UBIL, UNIT, UROB, ULEU, UEPI, UWBC, URBC, UBAC, CAST, UCOM, BILUA  MICROBIOLOGY: No results found for this or any previous visit (from the past 240 hour(s)).  RADIOLOGY STUDIES/RESULTS: Dg Chest 2 View  12/10/2014   CLINICAL DATA:  Chest pain, cough. Asthma with shortness of breath and wheezing.  EXAM: CHEST  2 VIEW  COMPARISON:  CT chest 12/09/2014 and Chest radiograph 12/06/2014.  FINDINGS: Trachea is midline. Heart size  is accentuated by somewhat low lung volumes. There is collapse/consolidation in the left lower lobe with elevation of the left hemidiaphragm. Lungs are otherwise clear. No pleural fluid. Old right rib fracture.  IMPRESSION: Persistent left lower lobe collapse/ consolidation. Centrally obstructing lesion can have this appearance. Please refer to cross-sectional imaging performed 12/09/2014 for further details and recommendations.   Electronically Signed   By: Lorin Picket M.D.   On: 12/10/2014 08:14   Dg Chest 2 View  12/06/2014   CLINICAL DATA:  Shortness of breath, cough and wheezing for 2 days, history of asthma  EXAM: CHEST - 2 VIEW  COMPARISON:  07/31/2013  FINDINGS: Lungs are clear. Heart size and mediastinal contours are within normal limits. No effusion.  Atheromatous aortic arch. Visualized skeletal structures are unremarkable.  IMPRESSION: No  acute cardiopulmonary disease.   Electronically Signed   By: Arne Cleveland M.D.   On: 12/06/2014 10:38   Ct Angio Chest Pe W/cm &/or Wo Cm  12/10/2014   ADDENDUM REPORT: 12/10/2014 11:06  ADDENDUM: There is a left right inversion error in the impression of the initial report. Conclusion #2 of the impression correctly states that there is left lower lobar collapse. However, it then goes on to read that this is due to abrupt cut off of the right lower lobe bronchus. This should read the LEFT lower lobe bronchus as described in the findings.  Signed,  Criselda Peaches, MD  [facility]  [facility]   Electronically Signed   By: Jacqulynn Cadet M.D.   On: 12/10/2014 11:06   12/10/2014   CLINICAL DATA:  69 year old female with shortness of breath and stroke  EXAM: CT ANGIOGRAPHY CHEST WITH CONTRAST  TECHNIQUE: Multidetector CT imaging of the chest was performed using the standard protocol during bolus administration of intravenous contrast. Multiplanar CT image reconstructions and MIPs were obtained to evaluate the  vascular anatomy.  CONTRAST:  128mL OMNIPAQUE IOHEXOL 350 MG/ML SOLN  COMPARISON:  Concurrently obtained chest x-ray stress that chest x-ray 12/06/2014; Prior chest CT 06/19/2008  FINDINGS: Mediastinum: Unremarkable CT appearance of the thyroid gland. No suspicious mediastinal or hilar adenopathy. Soft tissue attenuation density is noted in the left infrahilar region at the location of the occluded/obliterated left lower lobar bronchus. Moderately large hiatal hernia.  Heart/Vascular: Adequate opacification of the pulmonary arteries to the proximal subsegmental level. Evaluation beyond the segmental levels limited by mild respiratory motion. There is no evidence of focal central filling defect to suggest acute pulmonary embolus. The heart is within normal limits for size. No pericardial effusion. Trace calcifications present along the course of the left anterior descending coronary artery. No aortic aneurysm. Conventional 3 vessel arch. The origin of the left common carotid artery is not well seen secondary to streak artifact from a contrast bolus in the left subclavian vein.  Lungs/Pleura: Abrupt cut off of the left lower lobe bronchus with complete atelectasis of the left lower lobe. There is associated right to left shift of the cardiac and mediastinal structures. Compensatory hyperexpansion is noted of the left upper lobe. Trace paraseptal emphysema is present in the upper lobes. There are a few tiny scattered subpleural nodular densities likely reflecting subpleural lymph nodes. No suspicious pulmonary nodule or mass. No focal airspace consolidation. Mild lower lobe bronchial wall thickening.  Bones/Soft Tissues: No acute fracture or aggressive appearing lytic or blastic osseous lesion.  Upper Abdomen: Visualized upper abdominal organs are unremarkable.  Review of the MIP images confirms the above findings.  IMPRESSION: 1. Negative for acute pulmonary embolism. 2. Left lower lobar collapse with right to left  shift of the cardiac and mediastinal structures. There is associated abrupt cut off of the right lower lobar bronchus with vague soft tissue density in the region of the occlusion concerning for possible centrally obstructing endobronchial lesion. Additional considerations include inspissated secretions. Recommend further evaluation with bronchoscopy to assess for endobronchial / centrally obstructing primary bronchogenic carcinoma. 3. Atherosclerosis including calcifications within the coronary arteries. 4. Mild biapical paraseptal emphysema. 5. Moderately large hiatal hernia.  Electronically Signed: By: Jacqulynn Cadet M.D. On: 12/09/2014 17:25    Oren Binet, MD  [facility] Pager:[phone]  If 7PM-7AM, please contact night-coverage [email or web address] Password [email or web address] 12/11/2014, 12:31 PM   LOS: 5 days

## 2014-12-11 NOTE — Progress Notes (Signed)
Name: [name] MRN: [id number] DOB: [birth year]    ADMISSION DATE:  12/06/2014 CONSULTATION DATE:  01/015/2016  REFERRING MD :  Sloan Leiter  CHIEF COMPLAINT:  SOB  BRIEF PATIENT DESCRIPTION:  69 yo female former smoker admitted with dyspnea, productive cough, wheezing from asthma exacerbation.  Found to have LLL ATX with concern for obstructing lesion on CT chest and PCCM consulted.  She is followed by Dr. Lamonte Sakai in pulmonary office for asthma/VCD.   SIGNIFICANT EVENTS  1/02 - admitted for asthma exacerbation 1/05 - PCCM consulted for recs regarding CTA findings 1/6 cxr with continued lll collapse   STUDIES:  CTA Chest 1/05 >>> LLL collapse with ? Obstructing lesion  SUBJECTIVE: Still feels short of breath.  C/o Lt sided pleuritic chest pain.  Continues to have cough, wheeze.  VITAL SIGNS: Temp:  [97.9 F (36.6 C)-98.3 F (36.8 C)] 97.9 F (36.6 C) (01/07 0600) Pulse Rate:  [75-93] 75 (01/07 0600) Resp:  [18-19] 19 (01/07 0600) BP: (123-136)/(58-67) 130/67 mmHg (01/07 0600) SpO2:  [95 %-100 %] 100 % (01/07 0600)  PHYSICAL EXAMINATION: General: pleasant, "I'm better" Neuro: normal strength, intact HEENT: no stridor Cardiovascular: regular Lungs: b/l expiratory wheeze Abdomen: soft, non tender, +bs Musculoskeletal: No edema Skin: no rashes  CMP Latest Ref Rng 12/09/2014 12/07/2014 12/06/2014  Glucose 70 - 99 mg/dL 161(H) 138(H) 112(H)  BUN 6 - 23 mg/dL 19 15 15   Creatinine 0.50 - 1.10 mg/dL 0.96 1.04 0.95  Sodium 135 - 145 mmol/L 136 137 139  Potassium 3.5 - 5.1 mmol/L 4.2 4.2 4.0  Chloride 96 - 112 mEq/L 102 104 106  CO2 19 - 32 mmol/L 25 21 26   Calcium 8.4 - 10.5 mg/dL 8.9 9.0 9.0  Total Protein 6.0 - 8.3 g/dL - - 6.5  Albumin 3.3 - 5.5 g/dL - - -  Total Bilirubin 0.3 - 1.2 mg/dL - - 0.3  Alkaline Phos 39 - 117 U/L - - 65  AST 0 - 37 U/L - - 17  ALT 0 - 35 U/L - - 12    CBC Latest Ref Rng 12/07/2014 12/06/2014 10/21/2014  WBC 4.0 - 10.5 K/uL 10.9(H) 7.3 6.6    Hemoglobin 12.0 - 15.0 g/dL 11.9(L) 12.2 12.6  Hematocrit 36.0 - 46.0 % 36.5 37.1 38.6  Platelets 150 - 400 K/uL 438(H) 349 356    Dg Chest 2 View  12/10/2014   CLINICAL DATA:  Chest pain, cough. Asthma with shortness of breath and wheezing.  EXAM: CHEST  2 VIEW  COMPARISON:  CT chest 12/09/2014 and Chest radiograph 12/06/2014.  FINDINGS: Trachea is midline. Heart size is accentuated by somewhat low lung volumes. There is collapse/consolidation in the left lower lobe with elevation of the left hemidiaphragm. Lungs are otherwise clear. No pleural fluid. Old right rib fracture.  IMPRESSION: Persistent left lower lobe collapse/ consolidation. Centrally obstructing lesion can have this appearance. Please refer to cross-sectional imaging performed 12/09/2014 for further details and recommendations.   Electronically Signed   By: Lorin Picket M.D.   On: 12/10/2014 08:14   Ct Angio Chest Pe W/cm &/or Wo Cm  12/10/2014   ADDENDUM REPORT: 12/10/2014 11:06  ADDENDUM: There is a left right inversion error in the impression of the initial report. Conclusion #2 of the impression correctly states that there is left lower lobar collapse. However, it then goes on to read that this is due to abrupt cut off of the right lower lobe bronchus. This should read the LEFT lower lobe bronchus  as described in the findings.  Signed,  Criselda Peaches, MD  [facility]  [facility]   Electronically Signed   By: Jacqulynn Cadet M.D.   On: 12/10/2014 11:06   12/10/2014   CLINICAL DATA:  69 year old female with shortness of breath and stroke  EXAM: CT ANGIOGRAPHY CHEST WITH CONTRAST  TECHNIQUE: Multidetector CT imaging of the chest was performed using the standard protocol during bolus administration of intravenous contrast. Multiplanar CT image reconstructions and MIPs were obtained to evaluate the vascular anatomy.  CONTRAST:  127mL OMNIPAQUE IOHEXOL 350 MG/ML SOLN  COMPARISON:   Concurrently obtained chest x-ray stress that chest x-ray 12/06/2014; Prior chest CT 06/19/2008  FINDINGS: Mediastinum: Unremarkable CT appearance of the thyroid gland. No suspicious mediastinal or hilar adenopathy. Soft tissue attenuation density is noted in the left infrahilar region at the location of the occluded/obliterated left lower lobar bronchus. Moderately large hiatal hernia.  Heart/Vascular: Adequate opacification of the pulmonary arteries to the proximal subsegmental level. Evaluation beyond the segmental levels limited by mild respiratory motion. There is no evidence of focal central filling defect to suggest acute pulmonary embolus. The heart is within normal limits for size. No pericardial effusion. Trace calcifications present along the course of the left anterior descending coronary artery. No aortic aneurysm. Conventional 3 vessel arch. The origin of the left common carotid artery is not well seen secondary to streak artifact from a contrast bolus in the left subclavian vein.  Lungs/Pleura: Abrupt cut off of the left lower lobe bronchus with complete atelectasis of the left lower lobe. There is associated right to left shift of the cardiac and mediastinal structures. Compensatory hyperexpansion is noted of the left upper lobe. Trace paraseptal emphysema is present in the upper lobes. There are a few tiny scattered subpleural nodular densities likely reflecting subpleural lymph nodes. No suspicious pulmonary nodule or mass. No focal airspace consolidation. Mild lower lobe bronchial wall thickening.  Bones/Soft Tissues: No acute fracture or aggressive appearing lytic or blastic osseous lesion.  Upper Abdomen: Visualized upper abdominal organs are unremarkable.  Review of the MIP images confirms the above findings.  IMPRESSION: 1. Negative for acute pulmonary embolism. 2. Left lower lobar collapse with right to left shift of the cardiac and mediastinal structures. There is associated abrupt cut off  of the right lower lobar bronchus with vague soft tissue density in the region of the occlusion concerning for possible centrally obstructing endobronchial lesion. Additional considerations include inspissated secretions. Recommend further evaluation with bronchoscopy to assess for endobronchial / centrally obstructing primary bronchogenic carcinoma. 3. Atherosclerosis including calcifications within the coronary arteries. 4. Mild biapical paraseptal emphysema. 5. Moderately large hiatal hernia.  Electronically Signed: By: Jacqulynn Cadet M.D. On: 12/09/2014 17:25     ASSESSMENT / PLAN:  Acute hypoxic respiratory failure 2nd to asthma exacerbation with hx of VCD. Plan: - continue brovana, pulmicort, duoneb - d/c mucomyst - Continue levaquin - continue solumedrol 40 mg q8h  Lt lower lung ATX with possible obstructing lesion. Plan: - will need bronchoscopy when asthma symptoms improved  -1/7 still bronchospastic, possible FOB 1/8 per MD judgement.   Richardson Landry Minor ACNP [facility] PCCM Pager [phone] till 3 pm If no answer page [phone] 12/11/2014, 8:26 AM    Attending  I have seen and examined the patient with nurse practitioner/resident and agree with the note above.   Feeling better Still has diminished breath sounds left base CT and CXR images reviewed Plan bronchoscopy by Dr. Halford Chessman 1/8  at noon Keep NPO post midnight, hold lovenox 1/8  Roselie Awkward, MD [place] Pager: [phone] Cell: [phone] If no response, call [phone]

## 2014-12-12 ENCOUNTER — Encounter (HOSPITAL_COMMUNITY)
Admission: EM | Disposition: A | Discharge status: Home / Self Care | Payer: Self-pay | Source: Home / Self Care | Attending: Internal Medicine

## 2014-12-12 ENCOUNTER — Inpatient Hospital Stay (HOSPITAL_COMMUNITY): Payer: Medicare Other

## 2014-12-12 DIAGNOSIS — J9809 Other diseases of bronchus, not elsewhere classified: Secondary | ICD-10-CM

## 2014-12-12 HISTORY — PX: VIDEO BRONCHOSCOPY: SHX5072

## 2014-12-12 LAB — BASIC METABOLIC PANEL
Anion gap: 9 (ref 5–15)
BUN: 22 mg/dL (ref 6–23)
CHLORIDE: 102 meq/L (ref 96–112)
CO2: 25 mmol/L (ref 19–32)
Calcium: 8.7 mg/dL (ref 8.4–10.5)
Creatinine, Ser: 0.92 mg/dL (ref 0.50–1.10)
GFR calc Af Amer: 72 mL/min — ABNORMAL LOW (ref >90)
GFR, EST NON AFRICAN AMERICAN: 62 mL/min — AB (ref >90)
GLUCOSE: 147 mg/dL — AB (ref 70–99)
Potassium: 4.4 mmol/L (ref 3.5–5.1)
SODIUM: 136 mmol/L (ref 135–145)

## 2014-12-12 LAB — CBC
HEMATOCRIT: 37.3 % (ref 36.0–46.0)
HEMOGLOBIN: 12.6 g/dL (ref 12.0–15.0)
MCH: 26.9 pg (ref 26.0–34.0)
MCHC: 33.8 g/dL (ref 30.0–36.0)
MCV: 79.7 fL (ref 78.0–100.0)
PLATELETS: 480 10*3/uL — AB (ref 150–400)
RBC: 4.68 MIL/uL (ref 3.87–5.11)
RDW: 18.7 % — ABNORMAL HIGH (ref 11.5–15.5)
WBC: 9.5 10*3/uL (ref 4.0–10.5)

## 2014-12-12 LAB — PROTIME-INR
INR: 1.1 (ref 0.00–1.49)
Prothrombin Time: 14.3 seconds (ref 11.6–15.2)

## 2014-12-12 LAB — APTT: aPTT: 26 seconds (ref 24–37)

## 2014-12-12 SURGERY — VIDEO BRONCHOSCOPY WITHOUT FLUORO
Anesthesia: Moderate Sedation | Laterality: Bilateral

## 2014-12-12 MED ORDER — MIDAZOLAM HCL 5 MG/ML IJ SOLN
INTRAMUSCULAR | Status: AC
  Filled 2014-12-12: qty 2

## 2014-12-12 MED ORDER — FENTANYL CITRATE 0.05 MG/ML IJ SOLN
INTRAMUSCULAR | Status: AC
  Filled 2014-12-12: qty 4

## 2014-12-12 MED ORDER — LIDOCAINE HCL (PF) 1 % IJ SOLN
INTRAMUSCULAR | Status: DC | PRN
  Administered 2014-12-12: 6 mL

## 2014-12-12 MED ORDER — MIDAZOLAM HCL 5 MG/ML IJ SOLN
INTRAMUSCULAR | Status: DC | PRN
  Administered 2014-12-12 (×2): 2 mg via INTRAVENOUS

## 2014-12-12 MED ORDER — PANTOPRAZOLE SODIUM 40 MG PO TBEC
40.0000 mg | DELAYED_RELEASE_TABLET | Freq: Two times a day (BID) | ORAL | Status: DC
  Administered 2014-12-12 – 2014-12-13 (×2): 40 mg via ORAL
  Filled 2014-12-12 (×2): qty 1

## 2014-12-12 MED ORDER — FENTANYL CITRATE 0.05 MG/ML IJ SOLN
INTRAMUSCULAR | Status: DC | PRN
  Administered 2014-12-12 (×2): 50 ug via INTRAVENOUS

## 2014-12-12 MED ORDER — SODIUM CHLORIDE 0.9 % IV SOLN
INTRAVENOUS | Status: DC
  Administered 2014-12-12: 12:00:00 via INTRAVENOUS

## 2014-12-12 NOTE — Procedures (Signed)
Bronchoscopy Procedure Note [name] [id number] 1946-03-17  Procedure: Bronchoscopy Indications: Left lower lobe obstructing lesion on CT chest from 12/10/14.  Procedure Details Consent: Risks of procedure as well as the alternatives and risks of each were explained to the patient.  Consent for procedure obtained. Time Out: Verified patient identification, verified procedure, site/side was marked, verified correct patient position, special equipment/implants available, medications/allergies/relevent history reviewed, required imaging and test results available.  Performed  69 yo female admitted with dyspnea from asthma exacerbation.  She also has hx of VCD.  She had CT chest and was found to have obstructing lesion in Lt lower lobe with atelectasis.  She was brought to endoscopy suite.    She was given cetacaine for anesthesia of posterior pharynx.  She was given 4 mg versed and 100 mcg fentanyl for topical sedation and analgesia.    Bronchoscope was entered orally.  Vocal cords visualized and appeared swollen and edematous.  1% lidocaine instilled for topical anesthesia of airway.  Bronchoscope entered into trachea and carina visualized.    Bronchoscope then entered into right main bronchus.  Right upper, middle, and lower lobes visualized.  Mucosa normal and no endobronchial lesions.  Bronchoscope then entered into left main bronchus.  Left upper and lingular lobes visualized >> mucosa friable, but no endobronchial lesions.  There was thick white to yellow phlegm blocking left lower lobe.  Instilled 15 ml of saline while performing brisk suctioning with eventual removal of phlegm.  After this all segmental bronchi of left lower lobe visualized with no evidence for endobronchial lesions.  The bronchoscope was then withdrawn.   Oxygenation and hemodynamics stable throughout the procedure.  No immediate complications.    No specimens sent.  Diagnoses 1) Mucus plugging left lower  lobe bronchus - no endobronchial lesions 2) Vocal cord edema likely from chronic reflux with history of vocal cord dysfunction  Chesley Mires, MD [place] 12/12/2014, 12:16 PM Pager:  [phone] After 3pm call: [phone]

## 2014-12-12 NOTE — Progress Notes (Signed)
Video Bronchoscopy Performed   No Interventions  Pt tolerated well

## 2014-12-12 NOTE — Progress Notes (Signed)
PATIENT DETAILS Name: [name] Age: 69 y.o. Sex: female Date of Birth: [birth year] Admit Date: 12/06/2014 Admitting Physician Costin Karlyne Greenspan, MD IPJ:[id number],LPFXT, MD  Brief summary:  Patient is a 69 year old African-American female with a history of bronchial asthma who was admitted on 1/2 for acute asthma exacerbation. She was started on steroids, nebulized bronchodilators and empiric Levaquin treatment. Patient made some improvement, however continued to have bronchospasm. Subsequently a CT chest was done which revealed a left lower lobe segmental collapse. The CCM was consulted, plans are for flexible bronchoscopy on 12/12/14.  Subjective: Having shortness of breath with exertion.   Assessment/Plan: Active Problems:   Asthma exacerbation:Although improved but still with some wheezing. Continue Solumedrol, nebs and empriic Levaquin. CTA chest negative for PE but shows LLL segmental collapse. See below.Suspect triggered by a viral syndrome-  Influenza PCR neg. CXR neg for PNA.   LLL segmental collapse:started on Incentive spirometry, mucomyst nebs-now discontinued by PCCM, current suspicion for a mucus plug. S/p bronch today with clearance of mucous plugging    Diabetes:diet controlled. A1C 5.9    Hyperlipidemia:continue Statin    Essential thrombocythemia:platelet count stable, continue with Hydrea.     Hx of Sickle cell trait:continue Folate  Disposition: Remain inpatient  Antibiotics:  See below    Anti-infectives    Start     Dose/Rate Route Frequency Ordered Stop   12/07/14 1000  levofloxacin (LEVAQUIN) tablet 500 mg     500 mg Oral Daily 12/06/14 1337     12/06/14 1330  levofloxacin (LEVAQUIN) IVPB 750 mg     750 mg100 mL/hr over 90 Minutes Intravenous  Once 12/06/14 1321 12/06/14 1507      DVT Prophylaxis: Prophylactic Lovenox  Code Status: Full code  Family Communication None at bedside, patient awake, alert and understanding of above  plan  Procedures:  None  CONSULTS:  pulmonary  MEDICATIONS: Scheduled Meds: . arformoterol  15 mcg Nebulization BID  . aspirin EC  81 mg Oral Daily  . budesonide (PULMICORT) nebulizer solution  0.25 mg Nebulization BID  . butamben-tetracaine-benzocaine  1 spray Topical Once  . cholecalciferol  1,000 Units Oral Daily  . fluticasone  1 spray Each Nare BID  . folic acid  1 mg Oral Daily  . guaiFENesin  600 mg Oral BID  . hydroxyurea  1,000 mg Oral BID  . ipratropium-albuterol  3 mL Nebulization Q6H  . levofloxacin  500 mg Oral Daily  . lidocaine  1 application Topical Once  . methylPREDNISolone (SOLU-MEDROL) injection  40 mg Intravenous 3 times per day  . pantoprazole  40 mg Oral BID AC  . pravastatin  40 mg Oral Daily   Continuous Infusions: . sodium chloride 10 mL/hr at 12/12/14 1132   PRN Meds:.acetaminophen **OR** acetaminophen, albuterol, alum & mag hydroxide-simeth, benzonatate, bisacodyl, HYDROcodone-homatropine, morphine injection, ondansetron **OR** ondansetron (ZOFRAN) IV, oxyCODONE, phenylephrine, polyethylene glycol    PHYSICAL EXAM: Vital signs in last 24 hours: Filed Vitals:   12/12/14 1230 12/12/14 1235 12/12/14 1240 12/12/14 1245  BP: 129/78 141/84 136/78 123/78  Pulse: 96 96 92 93  Temp:      TempSrc:      Resp: 18 12 13 13   Height:      Weight:      SpO2: 100% 100% 96% 100%    Weight change:  Filed Weights   12/06/14 2300  Weight: 63.504 kg (140 lb)   Body mass index is 24.81 kg/(m^2).   Gen  Exam: Awake and alert with clear speech.  Not in any distress. Neck: Supple, No JVD.   Chest: Good air movement bilaterally- wheeze in right lung field CVS: S1 S2 Regular, no murmurs.  Abdomen: soft, BS +, non tender, non distended.  Extremities: no edema, lower extremities warm to touch. Neurologic: Non Focal.   Skin: No Rash.   Wounds: N/A.   Intake/Output from previous day:  Intake/Output Summary (Last 24 hours) at 12/12/14 1539 Last data filed  at 12/11/14 1813  Gross per 24 hour  Intake    360 ml  Output      0 ml  Net    360 ml     LAB RESULTS: CBC  Recent Labs Lab 12/06/14 1146 12/07/14 0530 12/12/14 0549  WBC 7.3 10.9* 9.5  HGB 12.2 11.9* 12.6  HCT 37.1 36.5 37.3  PLT 349 438* 480*  MCV 80.5 78.7 79.7  MCH 26.5 25.6* 26.9  MCHC 32.9 32.6 33.8  RDW 18.5* 18.3* 18.7*  LYMPHSABS 1.2  --   --   MONOABS 0.3  --   --   EOSABS 0.1  --   --   BASOSABS 0.0  --   --     Chemistries   Recent Labs Lab 12/06/14 1146 12/07/14 0530 12/09/14 1200 12/12/14 0549  NA 139 137 136 136  K 4.0 4.2 4.2 4.4  CL 106 104 102 102  CO2 26 21 25 25   GLUCOSE 112* 138* 161* 147*  BUN 15 15 19 22   CREATININE 0.95 1.04 0.96 0.92  CALCIUM 9.0 9.0 8.9 8.7    CBG: No results for input(s): GLUCAP in the last 168 hours.  GFR Estimated Creatinine Clearance: 51.7 mL/min (by C-G formula based on Cr of 0.92).  Coagulation profile  Recent Labs Lab 12/12/14 0549  INR 1.10    Cardiac Enzymes No results for input(s): CKMB, TROPONINI, MYOGLOBIN in the last 168 hours.  Invalid input(s): CK  Invalid input(s): POCBNP No results for input(s): DDIMER in the last 72 hours. No results for input(s): HGBA1C in the last 72 hours. No results for input(s): CHOL, HDL, LDLCALC, TRIG, CHOLHDL, LDLDIRECT in the last 72 hours. No results for input(s): TSH, T4TOTAL, T3FREE, THYROIDAB in the last 72 hours.  Invalid input(s): FREET3 No results for input(s): VITAMINB12, FOLATE, FERRITIN, TIBC, IRON, RETICCTPCT in the last 72 hours. No results for input(s): LIPASE, AMYLASE in the last 72 hours.  Urine Studies No results for input(s): UHGB, CRYS in the last 72 hours.  Invalid input(s): UACOL, UAPR, USPG, UPH, UTP, UGL, UKET, UBIL, UNIT, UROB, ULEU, UEPI, UWBC, URBC, UBAC, CAST, UCOM, BILUA  MICROBIOLOGY: No results found for this or any previous visit (from the past 240 hour(s)).  RADIOLOGY STUDIES/RESULTS: reviewed  Debbe Odea,  MD  [facility]  If 7PM-7AM, please contact night-coverage [email or web address] Password [email or web address] 12/12/2014, 3:39 PM   LOS: 6 days

## 2014-12-13 ENCOUNTER — Inpatient Hospital Stay (HOSPITAL_COMMUNITY): Payer: Medicare Other

## 2014-12-13 DIAGNOSIS — J9621 Acute and chronic respiratory failure with hypoxia: Secondary | ICD-10-CM

## 2014-12-13 MED ORDER — BENZONATATE 200 MG PO CAPS: 200.0000 mg | ORAL_CAPSULE | Freq: Three times a day (TID) | ORAL | Status: DC | PRN

## 2014-12-13 MED ORDER — TIOTROPIUM BROMIDE MONOHYDRATE 18 MCG IN CAPS: 18.0000 ug | ORAL_CAPSULE | Freq: Every day | RESPIRATORY_TRACT | Status: DC

## 2014-12-13 MED ORDER — GUAIFENESIN ER 600 MG PO TB12: 600.0000 mg | ORAL_TABLET | Freq: Two times a day (BID) | ORAL | Status: DC

## 2014-12-13 MED ORDER — ARFORMOTEROL TARTRATE 15 MCG/2ML IN NEBU: 15.0000 ug | INHALATION_SOLUTION | Freq: Two times a day (BID) | RESPIRATORY_TRACT | Status: DC

## 2014-12-13 MED ORDER — PREDNISONE 50 MG PO TABS
60.0000 mg | ORAL_TABLET | Freq: Every day | ORAL | Status: DC
  Administered 2014-12-13: 60 mg via ORAL
  Filled 2014-12-13 (×2): qty 1

## 2014-12-13 MED ORDER — PREDNISONE 10 MG PO TABS: 60.0000 mg | ORAL_TABLET | Freq: Every day | ORAL | Status: DC

## 2014-12-13 MED ORDER — HYDROCODONE-HOMATROPINE 5-1.5 MG/5ML PO SYRP: 5.0000 mL | ORAL_SOLUTION | Freq: Four times a day (QID) | ORAL | Status: DC | PRN

## 2014-12-13 NOTE — Discharge Planning (Signed)
Copy of AVS to pt and multiple  rx, pt verbalizes understanding,  dc'd per w/c at 1410 with all personal belongings with family.

## 2014-12-13 NOTE — Progress Notes (Signed)
   Name: [name] MRN: [id number] DOB: February 25, 1946    ADMISSION DATE:  12/06/2014 CONSULTATION DATE:  01/015/2016  REFERRING MD :  Sloan Leiter  CHIEF COMPLAINT:  SOB  BRIEF PATIENT DESCRIPTION:  69 yo female former smoker admitted with dyspnea, productive cough, wheezing from asthma exacerbation.  Found to have LLL ATX with concern for obstructing lesion on CT chest and PCCM consulted.  She is followed by Dr. Lamonte Sakai in pulmonary office for asthma/VCD.   SIGNIFICANT EVENTS  1/02 - admitted for asthma exacerbation 1/05 - PCCM consulted for recs regarding CTA findings 1/6 cxr with continued lll collapse  1/8- bronch for mucus plugs  STUDIES:  CTA Chest 1/05 >>> LLL collapse with ? Obstructing lesion  SUBJECTIVE:   Continues to have cough, wheeze. Non-productive. Some wheeze is baseline. She says she is going home.  VITAL SIGNS: Temp:  [98.1 F (36.7 C)-98.7 F (37.1 C)] 98.7 F (37.1 C) (01/09 0557) Pulse Rate:  [58-124] 94 (01/09 0557) Resp:  [12-31] 16 (01/09 0557) BP: (112-187)/(61-84) 112/61 mmHg (01/09 0557) SpO2:  [95 %-100 %] 97 % (01/09 0909)  PHYSICAL EXAMINATION: General: pleasant, no distress Neuro: normal strength, intact HEENT: no stridor Cardiovascular: regular, no murmur Lungs: b/l coarse expiratory wheeze Abdomen: soft, non tender, +bs Musculoskeletal: No edema Skin: no rashes  CMP Latest Ref Rng 12/12/2014 12/09/2014 12/07/2014  Glucose 70 - 99 mg/dL 147(H) 161(H) 138(H)  BUN 6 - 23 mg/dL 22 19 15   Creatinine 0.50 - 1.10 mg/dL 0.92 0.96 1.04  Sodium 135 - 145 mmol/L 136 136 137  Potassium 3.5 - 5.1 mmol/L 4.4 4.2 4.2  Chloride 96 - 112 mEq/L 102 102 104  CO2 19 - 32 mmol/L 25 25 21   Calcium 8.4 - 10.5 mg/dL 8.7 8.9 9.0  Total Protein 6.0 - 8.3 g/dL - - -  Albumin 3.3 - 5.5 g/dL - - -  Total Bilirubin 0.3 - 1.2 mg/dL - - -  Alkaline Phos 39 - 117 U/L - - -  AST 0 - 37 U/L - - -  ALT 0 - 35 U/L - - -    CBC Latest Ref Rng 12/12/2014 12/07/2014 12/06/2014    WBC 4.0 - 10.5 K/uL 9.5 10.9(H) 7.3  Hemoglobin 12.0 - 15.0 g/dL 12.6 11.9(L) 12.2  Hematocrit 36.0 - 46.0 % 37.3 36.5 37.1  Platelets 150 - 400 K/uL 480(H) 438(H) 349    No results found.   ASSESSMENT / PLAN:  Acute hypoxic respiratory failure 2nd to asthma exacerbation with hx of VCD. Plan: - continue brovana, pulmicort, duoneb- ok to convert to home meds and discharge to follow up with Dr Darlin [facility] - Continue levaquin to complete 7 day course - taper prednisone - recommend 2V CXR before discharge- ordered -Continue GERD management  Lt lower lung ATX due to mucus plugs Plan: Ordered Flutter device for home use      12/13/2014, 9:11 AM  CD Annamaria Boots, MD  PCCM [phone] If no response, call [phone]

## 2014-12-13 NOTE — Discharge Summary (Signed)
Physician Discharge Summary  [name] ZJI:[id number] DOB: [birth year] DOA: 12/06/2014  PCP: Merrilee Seashore, MD  Admit date: 12/06/2014 Discharge date: 12/13/2014  Time spent: 50 minutes  Recommendations for Outpatient Follow-up:  1. F/u with pulmonary in 1 wk    Discharge Condition: stable Diet recommendation: heart heatlhy  Discharge Diagnoses:  Principal Problem:   Asthma exacerbation Active Problems:   Diabetes   Hyperlipidemia   Essential thrombocythemia   Acute on chronic respiratory failure   Atelectasis   Lung collapse   History of present illness:  Patient is a 69 year old African-American female with a history of bronchial asthma who was admitted on 1/2 for acute asthma exacerbation. She was started on steroids, nebulized bronchodilators and empiric Levaquin treatment. Patient made some improvement, however continued to have bronchospasm. Subsequently a CT chest was done which revealed a left lower lobe segmental collapse.   Hospital Course:  Active Problems:  Asthma exacerbation:Although improved - CTA chest negative for PE but shows LLL segmental collapse. See below. - transitioned Solumedrol to prednisone - cont symbicort, Brovana, Tessalon, albuterol and a slow prednisone taper over the next 8 days - adding Spiriva as well - has completed a 7 day course of Levaquin.   Influenza PCR neg. CXR neg for PNA.   LLL segmental collapse: - S/p bronch on 1/8 with clearance of mucous plugging   Diabetes:diet controlled. A1C 5.9   Hyperlipidemia:continue Statin   Essential thrombocythemia:platelet count stable, continue with Hydrea.   Hx of Sickle cell trait:continue Folate  Procedures:  Bronchoscopy   Consultations:  Pulmonary   Discharge Exam: Filed Weights   12/06/14 2300  Weight: 63.504 kg (140 lb)   Filed Vitals:   12/13/14 0557  BP: 112/61  Pulse: 94  Temp: 98.7 F (37.1 C)  Resp: 16    General: AAO x 3, no  distress Cardiovascular: RRR, no murmurs  Respiratory: clear to auscultation bilaterally GI: soft, non-tender, non-distended, bowel sound positive  Discharge Instructions You were cared for by a hospitalist during your hospital stay. If you have any questions about your discharge medications or the care you received while you were in the hospital after you are discharged, you can call the unit and asked to speak with the hospitalist on call if the hospitalist that took care of you is not available. Once you are discharged, your primary care physician will handle any further medical issues. Please note that NO REFILLS for any discharge medications will be authorized once you are discharged, as it is imperative that you return to your primary care physician (or establish a relationship with a primary care physician if you do not have one) for your aftercare needs so that they can reassess your need for medications and monitor your lab values.      Discharge Instructions    Diet - low sodium heart healthy    Complete by:  As directed      Increase activity slowly    Complete by:  As directed             Medication List    TAKE these medications        arformoterol 15 MCG/2ML Nebu  Commonly known as:  BROVANA  Take 2 mLs (15 mcg total) by nebulization 2 (two) times daily.     aspirin EC 81 MG tablet  Take 81 mg by mouth daily.     benzonatate 200 MG capsule  Commonly known as:  TESSALON  Take 1 capsule (200 mg total) by  mouth 3 (three) times daily as needed for cough.     budesonide-formoterol 160-4.5 MCG/ACT inhaler  Commonly known as:  SYMBICORT  Inhale 2 puffs into the lungs 2 (two) times daily.     cholecalciferol 1000 UNITS tablet  Commonly known as:  VITAMIN D  Take 1,000 Units by mouth daily.     fluticasone 50 MCG/ACT nasal spray  Commonly known as:  FLONASE  Place 1 spray into the nose 2 (two) times daily.     guaiFENesin 600 MG 12 hr tablet  Commonly known as:   MUCINEX  Take 1 tablet (600 mg total) by mouth 2 (two) times daily.     HYDROcodone-homatropine 5-1.5 MG/5ML syrup  Commonly known as:  HYCODAN  Take 5 mLs by mouth every 6 (six) hours as needed for cough.     hydroxyurea 500 MG capsule  Commonly known as:  HYDREA  Take 2 pills once a day with food.     Magnesium 200 MG Tabs  Take by mouth 2 (two) times daily.     MIRALAX powder  Generic drug:  polyethylene glycol powder  Take 1 Container by mouth as needed.     omeprazole 20 MG capsule  Commonly known as:  PRILOSEC  Take 20 mg by mouth 2 (two) times daily before a meal.     pravastatin 40 MG tablet  Commonly known as:  PRAVACHOL  Take 40 mg by mouth daily.     predniSONE 10 MG tablet  Commonly known as:  DELTASONE  Take 6 tablets (60 mg total) by mouth daily with breakfast.     PROVENTIL HFA 108 (90 BASE) MCG/ACT inhaler  Generic drug:  albuterol  Inhale 2 puffs into the lungs every 4 (four) hours as needed for wheezing or shortness of breath.     albuterol (2.5 MG/3ML) 0.083% nebulizer solution  Commonly known as:  PROVENTIL  Take 2.5 mg by nebulization every 4 (four) hours as needed for shortness of breath. For shortness of breath.     tiotropium 18 MCG inhalation capsule  Commonly known as:  SPIRIVA HANDIHALER  Place 1 capsule (18 mcg total) into inhaler and inhale daily.       Allergies  Allergen Reactions  . Penicillins Itching and Rash   Follow-up Information    Follow up with Merrilee Seashore, MD.   Specialty:  Internal Medicine   Contact information:   [address] [place] [address] [phone]       Follow up with [facility], MD.   Specialty:  Internal Medicine   Contact information:   [address] [place] [address] [phone]        The results of significant diagnostics from this hospitalization (including imaging, microbiology, ancillary and laboratory) are listed below for  reference.    Significant Diagnostic Studies: Dg Chest 2 View  12/10/2014   CLINICAL DATA:  Chest pain, cough. Asthma with shortness of breath and wheezing.  EXAM: CHEST  2 VIEW  COMPARISON:  CT chest 12/09/2014 and Chest radiograph 12/06/2014.  FINDINGS: Trachea is midline. Heart size is accentuated by somewhat low lung volumes. There is collapse/consolidation in the left lower lobe with elevation of the left hemidiaphragm. Lungs are otherwise clear. No pleural fluid. Old right rib fracture.  IMPRESSION: Persistent left lower lobe collapse/ consolidation. Centrally obstructing lesion can have this appearance. Please refer to cross-sectional imaging performed 12/09/2014 for further details and recommendations.   Electronically Signed   By: Lorin Picket  M.D.   On: 12/10/2014 08:14   Dg Chest 2 View  12/06/2014   CLINICAL DATA:  Shortness of breath, cough and wheezing for 2 days, history of asthma  EXAM: CHEST - 2 VIEW  COMPARISON:  07/31/2013  FINDINGS: Lungs are clear. Heart size and mediastinal contours are within normal limits. No effusion.  Atheromatous aortic arch. Visualized skeletal structures are unremarkable.  IMPRESSION: No acute cardiopulmonary disease.   Electronically Signed   By: Arne Cleveland M.D.   On: 12/06/2014 10:38   Ct Angio Chest Pe W/cm &/or Wo Cm  12/10/2014   ADDENDUM REPORT: 12/10/2014 11:06  ADDENDUM: There is a left right inversion error in the impression of the initial report. Conclusion #2 of the impression correctly states that there is left lower lobar collapse. However, it then goes on to read that this is due to abrupt cut off of the right lower lobe bronchus. This should read the LEFT lower lobe bronchus as described in the findings.  Signed,  Criselda Peaches, MD  [facility]  [facility]   Electronically Signed   By: Jacqulynn Cadet M.D.   On: 12/10/2014 11:06   12/10/2014   CLINICAL DATA:  69 year old female with  shortness of breath and stroke  EXAM: CT ANGIOGRAPHY CHEST WITH CONTRAST  TECHNIQUE: Multidetector CT imaging of the chest was performed using the standard protocol during bolus administration of intravenous contrast. Multiplanar CT image reconstructions and MIPs were obtained to evaluate the vascular anatomy.  CONTRAST:  196mL OMNIPAQUE IOHEXOL 350 MG/ML SOLN  COMPARISON:  Concurrently obtained chest x-ray stress that chest x-ray 12/06/2014; Prior chest CT 06/19/2008  FINDINGS: Mediastinum: Unremarkable CT appearance of the thyroid gland. No suspicious mediastinal or hilar adenopathy. Soft tissue attenuation density is noted in the left infrahilar region at the location of the occluded/obliterated left lower lobar bronchus. Moderately large hiatal hernia.  Heart/Vascular: Adequate opacification of the pulmonary arteries to the proximal subsegmental level. Evaluation beyond the segmental levels limited by mild respiratory motion. There is no evidence of focal central filling defect to suggest acute pulmonary embolus. The heart is within normal limits for size. No pericardial effusion. Trace calcifications present along the course of the left anterior descending coronary artery. No aortic aneurysm. Conventional 3 vessel arch. The origin of the left common carotid artery is not well seen secondary to streak artifact from a contrast bolus in the left subclavian vein.  Lungs/Pleura: Abrupt cut off of the left lower lobe bronchus with complete atelectasis of the left lower lobe. There is associated right to left shift of the cardiac and mediastinal structures. Compensatory hyperexpansion is noted of the left upper lobe. Trace paraseptal emphysema is present in the upper lobes. There are a few tiny scattered subpleural nodular densities likely reflecting subpleural lymph nodes. No suspicious pulmonary nodule or mass. No focal airspace consolidation. Mild lower lobe bronchial wall thickening.  Bones/Soft Tissues: No acute  fracture or aggressive appearing lytic or blastic osseous lesion.  Upper Abdomen: Visualized upper abdominal organs are unremarkable.  Review of the MIP images confirms the above findings.  IMPRESSION: 1. Negative for acute pulmonary embolism. 2. Left lower lobar collapse with right to left shift of the cardiac and mediastinal structures. There is associated abrupt cut off of the right lower lobar bronchus with vague soft tissue density in the region of the occlusion concerning for possible centrally obstructing endobronchial lesion. Additional considerations include inspissated secretions. Recommend further evaluation with bronchoscopy to assess for endobronchial /  centrally obstructing primary bronchogenic carcinoma. 3. Atherosclerosis including calcifications within the coronary arteries. 4. Mild biapical paraseptal emphysema. 5. Moderately large hiatal hernia.  Electronically Signed: By: Jacqulynn Cadet M.D. On: 12/09/2014 17:25    Microbiology: No results found for this or any previous visit (from the past 240 hour(s)).   Labs: Basic Metabolic Panel:  Recent Labs Lab 12/06/14 1146 12/07/14 0530 12/09/14 1200 12/12/14 0549  NA 139 137 136 136  K 4.0 4.2 4.2 4.4  CL 106 104 102 102  CO2 26 21 25 25   GLUCOSE 112* 138* 161* 147*  BUN 15 15 19 22   CREATININE 0.95 1.04 0.96 0.92  CALCIUM 9.0 9.0 8.9 8.7   Liver Function Tests:  Recent Labs Lab 12/06/14 1146  AST 17  ALT 12  ALKPHOS 65  BILITOT 0.3  PROT 6.5  ALBUMIN 3.5   No results for input(s): LIPASE, AMYLASE in the last 168 hours. No results for input(s): AMMONIA in the last 168 hours. CBC:  Recent Labs Lab 12/06/14 1146 12/07/14 0530 12/12/14 0549  WBC 7.3 10.9* 9.5  NEUTROABS 5.7  --   --   HGB 12.2 11.9* 12.6  HCT 37.1 36.5 37.3  MCV 80.5 78.7 79.7  PLT 349 438* 480*   Cardiac Enzymes: No results for input(s): CKTOTAL, CKMB, CKMBINDEX, TROPONINI in the last 168 hours. BNP: BNP (last 3 results) No  results for input(s): PROBNP in the last 8760 hours. CBG: No results for input(s): GLUCAP in the last 168 hours.     SignedDebbe Odea, MD [facility] 12/13/2014, 11:14 AM

## 2014-12-15 ENCOUNTER — Encounter (HOSPITAL_COMMUNITY): Payer: Self-pay | Admitting: Pulmonary Disease

## 2014-12-19 ENCOUNTER — Ambulatory Visit (INDEPENDENT_AMBULATORY_CARE_PROVIDER_SITE_OTHER): Payer: Medicare Other | Admitting: Adult Health

## 2014-12-19 ENCOUNTER — Encounter: Payer: Self-pay | Admitting: Adult Health

## 2014-12-19 VITALS — BP 124/70 | HR 87 | Temp 97.7°F | Ht 63.0 in | Wt 191.2 lb

## 2014-12-19 DIAGNOSIS — B029 Zoster without complications: Secondary | ICD-10-CM | POA: Insufficient documentation

## 2014-12-19 DIAGNOSIS — J4541 Moderate persistent asthma with (acute) exacerbation: Secondary | ICD-10-CM

## 2014-12-19 MED ORDER — VALACYCLOVIR HCL 1 G PO TABS: 1000.0000 mg | ORAL_TABLET | Freq: Three times a day (TID) | ORAL | Status: AC

## 2014-12-19 MED ORDER — PREDNISONE 10 MG PO TABS: ORAL_TABLET | ORAL | Status: DC

## 2014-12-19 NOTE — Progress Notes (Signed)
Subjective:    Patient ID: [name], female    DOB: 09-21-1946   MRN: [id number] HPI 69 -year-old woman with a history of asthma and vocal cord dysfunction both of which have been significantly impacted by severe esophageal reflux. Has a hx esophageal stricture dilations by Dr Henrene Pastor. On omeprazole 20mg  two times a day. Uses Symbicort bid.   ROV 06/19/12 -- Hx of VCD and asthma that have been exacerbated by GERD and allergic rhinitis. Formerly on chronic steroids but we successfully weaned to off. She tells me that she was admitted to [facility] for HA and CP, L arm numbness - ECG was reassuring. During that admit she had more trouble w her breathing, treated w short burst pred. Still w some allergic sx, taking fluticasone. Also on omeprazole bid. In general her control is OK - using her SABA about 2x a day.   Acute ov 12/10/12 cc much worse since 1/4/7 was doing well on just symbicort but abuptly worse on 1/4 ? p Ran out of symbicort and prilosec only using q am   with poor hfa was dry cough/ wheeze and sob now with min activity.  Was not using any saba  Then since exac using up to every 4 hours.  ROV 12/20/12 -- Hx of VCD and asthma that have been exacerbated by GERD and allergic rhinitis. She saw Dr Melvyn Novas as above, treated for AE of her VCD with prednisone. She states that she never ran out of symbicort or omeprazole. Has also been seen in ED 2 days ago and rx solumedrol and more pred. No other associated sx or factors. She is having UA wheeze, cough (non-productive). She has stayed on the allegra and nasal steroid. Nothing has made this any better - not prednisone, BD's etc.   ROV 01/28/13 -- Hx of VCD and asthma that have been exacerbated by GERD and allergic rhinitis. She has been flaring since early January. We have been trying to aggressively treat allergies and GERD, weaned prednisone and stopped symbicort temporarily. She returns with improvement in her cough and UA wheeze, but she does have residual  cough. Unfortunately she is having more nasal congestion, ? URI. She is on allegra, fluticasone, omeprazole, [place].   ROV 05/08/13 -- labile VCD and asthma, severe GERD and rhinitis. Returns for f/u. Last time we restarted symbicort (stopped temporarily due to UA irritation). She remains on fluticasone, allegra, [place], omeprazole bid. Has required albuterol nebs more frequently. Having more exertional SOB, sudden wheezing with exertion.   Follow up 07/31/13 Patient returns for a one-week followup. She was seen last week for an acute office visit for asthmatic flare. She was treated with a one-week course of prednisone taper. Patient reports that she is improved, however continues to have intermittent wheezing, and cough. She denies any discolored mucus, or fever, chest pain, orthopnea, PND, or leg swelling. Still has drainage in throat.  ROV 09/11/13 -- labile VCD and asthma, severe GERD and rhinitis. Was seen by TP in 8/14 for an acute flare, treated w pred. Returns today reporting that she is better. She notes that the prednisone gave her a vaginal rash, probably yeast infxn. She is on symbicort, albuterol prn. She is having wheeze in the am and mid-afternoon. Albuterol helps   Acute OV 12/31/13 -- labile VCD and asthma, severe GERD and rhinitis. She presents today with an acute flare > she had been well until yesterday when  She developed more cough, dyspnea, UA noise. Of note she just  started hydrea for her essential thrombocytopenia. She can't identify any discrete trigger that set her off. No Uri sx or sick contacts.   ROV 04/10/14 -- f/u for VCD and asthma, GERD, rhinitis. She improved after we treated her for a flare in January, no w better. She has had allergy sx, sneezing, burning eyes. Some wheeze and cough. Taking fluticasone bid.   08/08/14 Follow Up VCD/asthma/GERD [name] Pt complains of  increased SOB, nonprod cough X2 weeks. Some nasal drainage  No fever, discolored mucus, edema or hemoptysis. No  recent travel or abx use.  Doing well until last couple of weeks.  Interested in [place].  We discussed flu shot .  Husband died 2 weeks ago from MI , under some stress .  Needs refill of symbicort .   12/19/2014 post hospital follow-up- VCD\asthma.\GERD\allergic rhinitis Patient presented for a post hospital follow-up. Patient was admitted January 2 to January 9 for a asthma exacerbation She was treated with aggressive course of IV antibiotics, steroids, and nebulized bronchodilators. CT chest was negative for pulmonary embolism but did show a left lower lobe segmental collapse She did require bronchoscopy with clearance of mucus plugging. Since discharge. Patient is feeling improved.  She denies any chest pain, orthopnea, PND or leg swelling Does complain of a rash along her right side of her buttocks. Complains that it burns and is painful to sit  ROS:  Constitutional:   No  weight loss, night sweats,  Fevers, chills, fatigue, or  lassitude.  HEENT:   No headaches,  Difficulty swallowing,  Tooth/dental problems, or  Sore throat,                No sneezing, itching, ear ache, nasal congestion, post nasal drip,   CV:  No chest pain,  Orthopnea, PND, swelling in lower extremities, anasarca, dizziness, palpitations, syncope.   GI  No heartburn, indigestion, abdominal pain, nausea, vomiting, diarrhea, change in bowel habits, loss of appetite, bloody stools.   Resp:   No chest wall deformity  Skin: no rash or lesions.  GU: no dysuria, change in color of urine, no urgency or frequency.  No flank pain, no hematuria   MS:  No joint pain or swelling.  No decreased range of motion.  No back pain.  Psych:  No change in mood or affect. No depression or anxiety.  No memory loss.          Objective:   Physical Exam Filed Vitals:   12/19/14 1124  BP: 124/70  Pulse: 87  Temp: 97.7 F (36.5 C)  TempSrc: Oral  Height: 5\' 3"  (1.6 m)  Weight: 191 lb 3.2 oz (86.728 kg)  SpO2: 99%    Gen: Pleasant, overwt, in no distress,  normal affect,   ENT: No lesions,  mouth clear,  oropharynx clear, no postnasal drip  Neck: supple no JVD, no stridor   Lungs : Decreased BS w/ no wheezing   Cardiovascular: RRR, heart sounds normal, no murmur or gallops, no peripheral edema  Musculoskeletal: No deformities, no cyanosis or clubbing  Neuro: alert, non focal  Skin: Warm, along the right upper buttocks noted grouped vesicles consistent with herpes zoster.    Assessment & Plan:

## 2014-12-19 NOTE — Assessment & Plan Note (Signed)
Recent exacerbation, now resolving  Plan  Please continue your Symbicort twice a day, rinse after use.  Continue on Spriva daily  Use albuterol when needed Follow with Dr Lamonte Sakai 6 weeks  or sooner if you have any problems. Please contact office for sooner follow up if symptoms do not improve or worsen or seek emergency care

## 2014-12-19 NOTE — Assessment & Plan Note (Signed)
Right buttocks shingles outbreak Unfortunately, patient had shingles vaccine Advise if not improving will need to see her primary care physician Plan  Valtrex 1000mg  Three times a day  For 7days  Prednisone taper over next week .  If rash or pain is not improving will need to see family doctor  Follow with Dr Lamonte Sakai 6 weeks  or sooner if you have any problems. Please contact office for sooner follow up if symptoms do not improve or worsen or seek emergency care

## 2014-12-19 NOTE — Patient Instructions (Addendum)
Valtrex 1000mg  Three times a day  For 7days  Prednisone taper over next week .  If rash or pain is not improving will need to see family doctor  Please continue your Symbicort twice a day, rinse after use.  Continue on Spriva daily  Use albuterol when needed Follow with Dr Lamonte Sakai 6 weeks  or sooner if you have any problems. Please contact office for sooner follow up if symptoms do not improve or worsen or seek emergency care

## 2015-01-27 ENCOUNTER — Other Ambulatory Visit: Payer: Self-pay | Admitting: Emergency Medicine

## 2015-02-17 ENCOUNTER — Ambulatory Visit (HOSPITAL_BASED_OUTPATIENT_CLINIC_OR_DEPARTMENT_OTHER): Payer: Medicare Other | Admitting: Hematology & Oncology

## 2015-02-17 ENCOUNTER — Encounter: Payer: Self-pay | Admitting: Hematology & Oncology

## 2015-02-17 ENCOUNTER — Other Ambulatory Visit (HOSPITAL_BASED_OUTPATIENT_CLINIC_OR_DEPARTMENT_OTHER): Payer: Medicare Other | Admitting: Lab

## 2015-02-17 VITALS — BP 132/72 | HR 81 | Temp 98.1°F | Resp 14 | Ht 63.0 in | Wt 189.0 lb

## 2015-02-17 DIAGNOSIS — D473 Essential (hemorrhagic) thrombocythemia: Secondary | ICD-10-CM

## 2015-02-17 LAB — CBC WITH DIFFERENTIAL (CANCER CENTER ONLY)
BASO#: 0 10*3/uL (ref 0.0–0.2)
BASO%: 0.7 % (ref 0.0–2.0)
EOS ABS: 0.1 10*3/uL (ref 0.0–0.5)
EOS%: 1.7 % (ref 0.0–7.0)
HEMATOCRIT: 34.9 % (ref 34.8–46.6)
HEMOGLOBIN: 11.5 g/dL — AB (ref 11.6–15.9)
LYMPH#: 1.4 10*3/uL (ref 0.9–3.3)
LYMPH%: 24.8 % (ref 14.0–48.0)
MCH: 28.4 pg (ref 26.0–34.0)
MCHC: 33 g/dL (ref 32.0–36.0)
MCV: 86 fL (ref 81–101)
MONO#: 0.4 10*3/uL (ref 0.1–0.9)
MONO%: 7.7 % (ref 0.0–13.0)
NEUT#: 3.7 10*3/uL (ref 1.5–6.5)
NEUT%: 65.1 % (ref 39.6–80.0)
PLATELETS: 380 10*3/uL (ref 145–400)
RBC: 4.05 10*6/uL (ref 3.70–5.32)
RDW: 18.8 % — AB (ref 11.1–15.7)
WBC: 5.7 10*3/uL (ref 3.9–10.0)

## 2015-02-17 LAB — IRON AND TIBC CHCC
%SAT: 38 % (ref 21–57)
Iron: 87 ug/dL (ref 41–142)
TIBC: 228 ug/dL — ABNORMAL LOW (ref 236–444)
UIBC: 140 ug/dL (ref 120–384)

## 2015-02-17 LAB — RETICULOCYTES (CHCC)
ABS RETIC: 70.4 10*3/uL (ref 19.0–186.0)
RBC.: 4.14 MIL/uL (ref 3.87–5.11)
RETIC CT PCT: 1.7 % (ref 0.4–2.3)

## 2015-02-17 LAB — CHCC SATELLITE - SMEAR

## 2015-02-17 LAB — FERRITIN CHCC: Ferritin: 154 ng/ml (ref 9–269)

## 2015-02-17 NOTE — Progress Notes (Signed)
Hematology and Oncology Follow Up Visit  [name] [id number] March 06, 1946 69 y.o. 02/17/2015   Principle Diagnosis:   Essential thrombocythemia-JAK2 POSITIVE  Sickle cell trait  Current Therapy:    Hydrea 500mg  by mouth once  a day  Folic acid 1 mg by mouth daily     Interim History:  [name] is back for followup. She was hospitalized back in January. She was in the hospital for about 6 days. She had a bad asthma attack. Her prior asthma attack was about 5 years ago.  She still feels tired. She does not have a lot of energy. Some of this might be from the [facility]. As such, I think that we will cut her Hydrea back.  She's had problems with her left knee. She has knee operated on. Is really causing a lot of pain and discomfort. I told her to go back to see the orthopedic surgeon who did the surgery.  She's had no fever. She's had no diarrhea. She's had no rashes.  Overall, her performance status is ECOG 1.   Medications:  Current outpatient prescriptions:  .  albuterol (PROVENTIL HFA) 108 (90 BASE) MCG/ACT inhaler, Inhale 2 puffs into the lungs every 4 (four) hours as needed for wheezing or shortness of breath. , Disp: , Rfl:  .  albuterol (PROVENTIL) (2.5 MG/3ML) 0.083% nebulizer solution, Take 2.5 mg by nebulization every 4 (four) hours as needed for shortness of breath. For shortness of breath., Disp: , Rfl:  .  arformoterol (BROVANA) 15 MCG/2ML NEBU, Take 2 mLs (15 mcg total) by nebulization 2 (two) times daily., Disp: 120 mL, Rfl: 0 .  aspirin EC 81 MG tablet, Take 81 mg by mouth daily., Disp: , Rfl:  .  benzonatate (TESSALON) 200 MG capsule, Take 1 capsule (200 mg total) by mouth 3 (three) times daily as needed for cough. (Patient not taking: Reported on 02/17/2015), Disp: 20 capsule, Rfl: 0 .  budesonide-formoterol (SYMBICORT) 160-4.5 MCG/ACT inhaler, Inhale 2 puffs into the lungs 2 (two) times daily., Disp: 10.2 g, Rfl: 5 .  cholecalciferol (VITAMIN D) 1000 UNITS  tablet, Take 1,000 Units by mouth daily., Disp: , Rfl:  .  fluticasone (FLONASE) 50 MCG/ACT nasal spray, Place 1 spray into the nose 2 (two) times daily. , Disp: , Rfl:  .  guaiFENesin (MUCINEX) 600 MG 12 hr tablet, Take 1 tablet (600 mg total) by mouth 2 (two) times daily. (Patient not taking: Reported on 02/17/2015), Disp: 60 tablet, Rfl: 6 .  HYDROcodone-homatropine (HYCODAN) 5-1.5 MG/5ML syrup, Take 5 mLs by mouth every 6 (six) hours as needed for cough. (Patient not taking: Reported on 02/17/2015), Disp: 240 mL, Rfl: 0 .  hydroxyurea (HYDREA) 500 MG capsule, Take 2 pills once a day with food. (Patient taking differently: Take 1,000 mg by mouth 2 (two) times daily. Take 2 pills once a day with food.), Disp: 60 capsule, Rfl: 3 .  Magnesium 200 MG TABS, Take by mouth 2 (two) times daily., Disp: , Rfl:  .  omeprazole (PRILOSEC) 20 MG capsule, TAKE ONE CAPSULE BY MOUTH TWICE DAILY, Disp: 60 capsule, Rfl: 5 .  polyethylene glycol powder (MIRALAX) powder, Take 1 Container by mouth as needed., Disp: , Rfl:  .  pravastatin (PRAVACHOL) 40 MG tablet, Take 40 mg by mouth daily., Disp: , Rfl:  .  predniSONE (DELTASONE) 10 MG tablet, 4 tabs for 2 days, then 3 tabs for 2 days, 2 tabs for 2 days, then 1 tab for 2 days, then  stop (Patient not taking: Reported on 02/17/2015), Disp: 20 tablet, Rfl: 0 .  tiotropium (SPIRIVA HANDIHALER) 18 MCG inhalation capsule, Place 1 capsule (18 mcg total) into inhaler and inhale daily., Disp: 30 capsule, Rfl: 12  Allergies:  Allergies  Allergen Reactions  . Penicillins Itching and Rash    Past Medical History, Surgical history, Social history, and Family History were reviewed and updated.  Review of Systems: As above  Physical Exam:  height is 5\' 3"  (1.6 m) and weight is 189 lb (85.73 kg). Her oral temperature is 98.1 F (36.7 C). Her blood pressure is 132/72 and her pulse is 81. Her respiration is 14.   Well-developed and well-nourished [place] female. Head and  neck exam shows no ocular or oral lesions. She has no palpable cervical or supraclavicular lymph nodes. Lungs are clear bilaterally. No wheezes, rales or rhonchi are noted. Cardiac exam regular rate and rhythm with no murmurs, rubs or bruits. Abdomen is soft. She has good bowel sounds. There is no palpable liver or spleen tip. Extremities shows no clubbing, cyanosis or edema. She has good range of motion of her joints. Skin exam shows no rashes, ecchymosis or petechia. Neurological exam is non-focal.  Lab Results  Component Value Date   WBC 5.7 02/17/2015   HGB 11.5* 02/17/2015   HCT 34.9 02/17/2015   MCV 86 02/17/2015   PLT 380 02/17/2015     Chemistry      Component Value Date/Time   NA 136 12/12/2014 0549   NA 144 07/21/2014 0940   K 4.4 12/12/2014 0549   K 3.8 07/21/2014 0940   CL 102 12/12/2014 0549   CL 104 07/21/2014 0940   CO2 25 12/12/2014 0549   CO2 28 07/21/2014 0940   BUN 22 12/12/2014 0549   BUN 15 07/21/2014 0940   CREATININE 0.92 12/12/2014 0549   CREATININE 0.7 07/21/2014 0940      Component Value Date/Time   CALCIUM 8.7 12/12/2014 0549   CALCIUM 9.1 07/21/2014 0940   ALKPHOS 65 12/06/2014 1146   ALKPHOS 63 07/21/2014 0940   AST 17 12/06/2014 1146   AST 15 07/21/2014 0940   ALT 12 12/06/2014 1146   ALT 16 07/21/2014 0940   BILITOT 0.3 12/06/2014 1146   BILITOT 0.60 07/21/2014 0940         Impression and Plan: [name] is 69 year old female with essential thrombocythemia. I will go ahead and decrease her dose of Hydrea down to 500 mg a day.  We will see what her iron studies show. It is possible she is iron deficient. If so, they given her IV iron during May help her with fatigue. Her last iron studies done back in November looked pretty good.  I want to see her back in 6 weeks' time. I want to make sure that everything is okay with her platelets that she is feeling better. I just head that she is still fatigued.   [name],  MD 3/15/201610:12 AM

## 2015-02-18 DIAGNOSIS — M1712 Unilateral primary osteoarthritis, left knee: Secondary | ICD-10-CM | POA: Insufficient documentation

## 2015-02-18 DIAGNOSIS — M1710 Unilateral primary osteoarthritis, unspecified knee: Secondary | ICD-10-CM | POA: Insufficient documentation

## 2015-03-30 ENCOUNTER — Other Ambulatory Visit: Payer: Self-pay

## 2015-03-30 DIAGNOSIS — D473 Essential (hemorrhagic) thrombocythemia: Secondary | ICD-10-CM

## 2015-03-30 MED ORDER — HYDROXYUREA 500 MG PO CAPS: ORAL_CAPSULE | ORAL | Status: DC

## 2015-03-31 ENCOUNTER — Telehealth: Payer: Self-pay | Admitting: Hematology & Oncology

## 2015-03-31 ENCOUNTER — Encounter: Payer: Self-pay | Admitting: Family

## 2015-03-31 ENCOUNTER — Ambulatory Visit (HOSPITAL_BASED_OUTPATIENT_CLINIC_OR_DEPARTMENT_OTHER): Payer: Medicare Other | Admitting: Family

## 2015-03-31 ENCOUNTER — Other Ambulatory Visit (HOSPITAL_BASED_OUTPATIENT_CLINIC_OR_DEPARTMENT_OTHER): Payer: Medicare Other

## 2015-03-31 VITALS — BP 126/71 | HR 81 | Temp 97.9°F | Resp 14 | Ht 63.0 in | Wt 191.0 lb

## 2015-03-31 DIAGNOSIS — D473 Essential (hemorrhagic) thrombocythemia: Secondary | ICD-10-CM

## 2015-03-31 LAB — COMPREHENSIVE METABOLIC PANEL (CC13)
ALK PHOS: 68 U/L (ref 40–150)
ALT: 15 U/L (ref 0–55)
AST: 17 U/L (ref 5–34)
Albumin: 3.4 g/dL — ABNORMAL LOW (ref 3.5–5.0)
Anion Gap: 14 mEq/L — ABNORMAL HIGH (ref 3–11)
BUN: 12.4 mg/dL (ref 7.0–26.0)
CO2: 22 mEq/L (ref 22–29)
Calcium: 9.2 mg/dL (ref 8.4–10.4)
Chloride: 107 mEq/L (ref 98–109)
Creatinine: 0.9 mg/dL (ref 0.6–1.1)
EGFR: 78 mL/min/{1.73_m2} — ABNORMAL LOW (ref >90)
GLUCOSE: 90 mg/dL (ref 70–140)
POTASSIUM: 4.1 meq/L (ref 3.5–5.1)
SODIUM: 143 meq/L (ref 136–145)
TOTAL PROTEIN: 6.5 g/dL (ref 6.4–8.3)
Total Bilirubin: 0.34 mg/dL (ref 0.20–1.20)

## 2015-03-31 LAB — IRON AND TIBC CHCC
%SAT: 34 % (ref 21–57)
Iron: 83 ug/dL (ref 41–142)
TIBC: 241 ug/dL (ref 236–444)
UIBC: 159 ug/dL (ref 120–384)

## 2015-03-31 LAB — CBC WITH DIFFERENTIAL (CANCER CENTER ONLY)
BASO#: 0 10*3/uL (ref 0.0–0.2)
BASO%: 0.7 % (ref 0.0–2.0)
EOS%: 1.2 % (ref 0.0–7.0)
Eosinophils Absolute: 0.1 10*3/uL (ref 0.0–0.5)
HCT: 37.5 % (ref 34.8–46.6)
HGB: 12.5 g/dL (ref 11.6–15.9)
LYMPH#: 1.4 10*3/uL (ref 0.9–3.3)
LYMPH%: 24.1 % (ref 14.0–48.0)
MCH: 28.5 pg (ref 26.0–34.0)
MCHC: 33.3 g/dL (ref 32.0–36.0)
MCV: 85 fL (ref 81–101)
MONO#: 0.4 10*3/uL (ref 0.1–0.9)
MONO%: 6.7 % (ref 0.0–13.0)
NEUT#: 3.9 10*3/uL (ref 1.5–6.5)
NEUT%: 67.3 % (ref 39.6–80.0)
Platelets: 466 10*3/uL — ABNORMAL HIGH (ref 145–400)
RBC: 4.39 10*6/uL (ref 3.70–5.32)
RDW: 15.6 % (ref 11.1–15.7)
WBC: 5.9 10*3/uL (ref 3.9–10.0)

## 2015-03-31 LAB — FERRITIN CHCC: Ferritin: 116 ng/ml (ref 9–269)

## 2015-03-31 LAB — RETICULOCYTES (CHCC)
ABS Retic: 67.7 10*3/uL (ref 19.0–186.0)
RBC.: 4.51 MIL/uL (ref 3.87–5.11)
RETIC CT PCT: 1.5 % (ref 0.4–2.3)

## 2015-03-31 LAB — LACTATE DEHYDROGENASE: LDH: 245 U/L (ref 94–250)

## 2015-03-31 LAB — CHCC SATELLITE - SMEAR

## 2015-03-31 MED ORDER — HYDROXYUREA 500 MG PO CAPS: ORAL_CAPSULE | ORAL | Status: DC

## 2015-03-31 NOTE — Progress Notes (Signed)
Hematology and Oncology Follow Up Visit  [name] [id number] June 30, 1946 69 y.o. 03/31/2015   Principle Diagnosis:  Essential thrombocythemia-JAK2 POSITIVE Sickle cell trait  Current Therapy:   Hydrea 500mg  by mouth once a day Folic acid 1 mg by mouth daily    Interim History:  [name] is here today for a follow-up. She is still having a hard time missing her husband and sister. Both dies within the last 8 months. She is having some depression and plans to speak with her PCP about it when she sees him in a few weeks.  Her children are checking on her daily and she is going to a grief support group at church every month.  She is feeling tired and not sleeping well. She has had no problem with infections.  No fever, chills, n/v, cough, rash, dizziness, chest pain, palpitations, abdominal pain, constipation, diarrhea, blood in urine or stool. She has some SOB with exertion due to asthma.  Her platelets have increased to 466. We will increase her hydrea to 1 pill one day and two pills the next.  No numbness or tingling in her extremities. She still has some tenderness in her left knee that comes and goes. No new aches or pains.  Her appetite is ok and she is staying hydrated.   Medications:    Medication List       This list is accurate as of: 03/31/15  9:18 AM.  Always use your most recent med list.               arformoterol 15 MCG/2ML Nebu  Commonly known as:  BROVANA  Take 2 mLs (15 mcg total) by nebulization 2 (two) times daily.     aspirin EC 81 MG tablet  Take 81 mg by mouth daily.     benzonatate 200 MG capsule  Commonly known as:  TESSALON  Take 1 capsule (200 mg total) by mouth 3 (three) times daily as needed for cough.     budesonide-formoterol 160-4.5 MCG/ACT inhaler  Commonly known as:  SYMBICORT  Inhale 2 puffs into the lungs 2 (two) times daily.     cholecalciferol 1000 UNITS tablet  Commonly known as:  VITAMIN D  Take 1,000 Units by mouth  daily.     fluticasone 50 MCG/ACT nasal spray  Commonly known as:  FLONASE  Place 1 spray into the nose 2 (two) times daily.     guaiFENesin 600 MG 12 hr tablet  Commonly known as:  MUCINEX  Take 1 tablet (600 mg total) by mouth 2 (two) times daily.     HYDROcodone-homatropine 5-1.5 MG/5ML syrup  Commonly known as:  HYCODAN  Take 5 mLs by mouth every 6 (six) hours as needed for cough.     hydroxyurea 500 MG capsule  Commonly known as:  HYDREA  Take once a day with food or as directed.     Magnesium 200 MG Tabs  Take by mouth 2 (two) times daily.     MIRALAX powder  Generic drug:  polyethylene glycol powder  Take 1 Container by mouth as needed.     omeprazole 20 MG capsule  Commonly known as:  PRILOSEC  TAKE ONE CAPSULE BY MOUTH TWICE DAILY     pravastatin 40 MG tablet  Commonly known as:  PRAVACHOL  Take 40 mg by mouth daily.     predniSONE 10 MG tablet  Commonly known as:  DELTASONE  4 tabs for 2 days, then 3 tabs for 2  days, 2 tabs for 2 days, then 1 tab for 2 days, then stop     PROVENTIL HFA 108 (90 BASE) MCG/ACT inhaler  Generic drug:  albuterol  Inhale 2 puffs into the lungs every 4 (four) hours as needed for wheezing or shortness of breath.     albuterol (2.5 MG/3ML) 0.083% nebulizer solution  Commonly known as:  PROVENTIL  Take 2.5 mg by nebulization every 4 (four) hours as needed for shortness of breath. For shortness of breath.     tiotropium 18 MCG inhalation capsule  Commonly known as:  SPIRIVA HANDIHALER  Place 1 capsule (18 mcg total) into inhaler and inhale daily.        Allergies:  Allergies  Allergen Reactions  . Penicillins Itching and Rash    Past Medical History, Surgical history, Social history, and Family History were reviewed and updated.  Review of Systems: All other 10 point review of systems is negative.   Physical Exam:  vitals were not taken for this visit.  Wt Readings from Last 3 Encounters:  02/17/15 189 lb (85.73 kg)    12/19/14 191 lb 3.2 oz (86.728 kg)  12/06/14 140 lb (63.504 kg)    Ocular: Sclerae unicteric, pupils equal, round and reactive to light Ear-nose-throat: Oropharynx clear, dentition fair Lymphatic: No cervical or supraclavicular adenopathy Lungs no rales or rhonchi, good excursion bilaterally Heart regular rate and rhythm, no murmur appreciated Abd soft, nontender, positive bowel sounds MSK no focal spinal tenderness, no joint edema Neuro: non-focal, well-oriented, appropriate affect Breasts: Deferred  Lab Results  Component Value Date   WBC 5.9 03/31/2015   HGB 12.5 03/31/2015   HCT 37.5 03/31/2015   MCV 85 03/31/2015   PLT 466* 03/31/2015   Lab Results  Component Value Date   FERRITIN 154 02/17/2015   IRON 87 02/17/2015   TIBC 228* 02/17/2015   UIBC 140 02/17/2015   IRONPCTSAT 38 02/17/2015   Lab Results  Component Value Date   RETICCTPCT 1.7 02/17/2015   RBC 4.39 03/31/2015   RETICCTABS 70.4 02/17/2015   No results found for: KPAFRELGTCHN, LAMBDASER, KAPLAMBRATIO No results found for: IGGSERUM, IGA, IGMSERUM No results found for: Odetta Pink, SPEI   Chemistry      Component Value Date/Time   NA 136 12/12/2014 0549   NA 144 07/21/2014 0940   K 4.4 12/12/2014 0549   K 3.8 07/21/2014 0940   CL 102 12/12/2014 0549   CL 104 07/21/2014 0940   CO2 25 12/12/2014 0549   CO2 28 07/21/2014 0940   BUN 22 12/12/2014 0549   BUN 15 07/21/2014 0940   CREATININE 0.92 12/12/2014 0549   CREATININE 0.7 07/21/2014 0940      Component Value Date/Time   CALCIUM 8.7 12/12/2014 0549   CALCIUM 9.1 07/21/2014 0940   ALKPHOS 65 12/06/2014 1146   ALKPHOS 63 07/21/2014 0940   AST 17 12/06/2014 1146   AST 15 07/21/2014 0940   ALT 12 12/06/2014 1146   ALT 16 07/21/2014 0940   BILITOT 0.3 12/06/2014 1146   BILITOT 0.60 07/21/2014 0940     Impression and Plan: [name] is 69 year old female with essential thrombocythemia.  Her platelets are going back up. We will have her start taking Hydrea 1 pill one day and two pills the next alternating.  She is still feeling fatigued and having some depression over her husband's death. She has an appointment to see her PCP to discuss this.  We will plan  to see her back in 6 weeks for labs and follow-up.  She knows to call here with any questions or concerns. We can certainly see her sooner if need be.   Eliezer Bottom, NP 4/26/20169:18 AM

## 2015-03-31 NOTE — Telephone Encounter (Signed)
Pt made 7 week instead of 6 she is going out of town

## 2015-05-05 ENCOUNTER — Telehealth: Payer: Self-pay | Admitting: Emergency Medicine

## 2015-05-05 NOTE — Telephone Encounter (Signed)
We are out of samples of Symbicort.  Called and advised patient.  Asked patient if she needs refill on Symbicort and she denied refills, says she cannot afford it.  Nothing further needed.

## 2015-05-12 ENCOUNTER — Telehealth: Payer: Self-pay | Admitting: Hematology & Oncology

## 2015-05-12 NOTE — Telephone Encounter (Signed)
Patient walked into office and cx 05/19/15 apt and resch for 06/03/15

## 2015-05-19 ENCOUNTER — Other Ambulatory Visit: Payer: Medicare Other

## 2015-05-19 ENCOUNTER — Ambulatory Visit: Payer: Medicare Other | Admitting: Hematology & Oncology

## 2015-05-22 ENCOUNTER — Encounter: Payer: Self-pay | Admitting: Internal Medicine

## 2015-06-01 ENCOUNTER — Telehealth: Payer: Self-pay | Admitting: Emergency Medicine

## 2015-06-01 NOTE — Telephone Encounter (Signed)
Sample of Symbicort 160 given.  Nothing further needed.

## 2015-06-01 NOTE — Telephone Encounter (Signed)
Pt's daughter is in lobby waiting on sample

## 2015-06-01 NOTE — Telephone Encounter (Signed)
ATC fast busy signal line wcb

## 2015-06-03 ENCOUNTER — Encounter: Payer: Self-pay | Admitting: Family

## 2015-06-03 ENCOUNTER — Other Ambulatory Visit (HOSPITAL_BASED_OUTPATIENT_CLINIC_OR_DEPARTMENT_OTHER): Payer: Medicare Other

## 2015-06-03 ENCOUNTER — Ambulatory Visit (HOSPITAL_BASED_OUTPATIENT_CLINIC_OR_DEPARTMENT_OTHER): Payer: Medicare Other | Admitting: Family

## 2015-06-03 VITALS — BP 122/69 | HR 84 | Temp 97.5°F | Resp 18 | Ht 63.0 in | Wt 192.0 lb

## 2015-06-03 DIAGNOSIS — D473 Essential (hemorrhagic) thrombocythemia: Secondary | ICD-10-CM | POA: Diagnosis not present

## 2015-06-03 DIAGNOSIS — D573 Sickle-cell trait: Secondary | ICD-10-CM | POA: Diagnosis not present

## 2015-06-03 LAB — LACTATE DEHYDROGENASE: LDH: 268 U/L — AB (ref 94–250)

## 2015-06-03 LAB — COMPREHENSIVE METABOLIC PANEL
ALT: 17 U/L (ref 0–35)
AST: 15 U/L (ref 0–37)
Albumin: 3.7 g/dL (ref 3.5–5.2)
Alkaline Phosphatase: 65 U/L (ref 39–117)
BUN: 17 mg/dL (ref 6–23)
CHLORIDE: 105 meq/L (ref 96–112)
CO2: 25 mEq/L (ref 19–32)
Calcium: 8.8 mg/dL (ref 8.4–10.5)
Creatinine, Ser: 0.86 mg/dL (ref 0.50–1.10)
Glucose, Bld: 90 mg/dL (ref 70–99)
POTASSIUM: 4.8 meq/L (ref 3.5–5.3)
SODIUM: 141 meq/L (ref 135–145)
TOTAL PROTEIN: 6.1 g/dL (ref 6.0–8.3)
Total Bilirubin: 0.3 mg/dL (ref 0.2–1.2)

## 2015-06-03 LAB — CBC WITH DIFFERENTIAL (CANCER CENTER ONLY)
BASO#: 0 10*3/uL (ref 0.0–0.2)
BASO%: 0.5 % (ref 0.0–2.0)
EOS ABS: 0.1 10*3/uL (ref 0.0–0.5)
EOS%: 1.8 % (ref 0.0–7.0)
HCT: 39.2 % (ref 34.8–46.6)
HGB: 12.9 g/dL (ref 11.6–15.9)
LYMPH#: 1.8 10*3/uL (ref 0.9–3.3)
LYMPH%: 23.1 % (ref 14.0–48.0)
MCH: 26.2 pg (ref 26.0–34.0)
MCHC: 32.9 g/dL (ref 32.0–36.0)
MCV: 80 fL — ABNORMAL LOW (ref 81–101)
MONO#: 0.6 10*3/uL (ref 0.1–0.9)
MONO%: 7.5 % (ref 0.0–13.0)
NEUT#: 5.2 10*3/uL (ref 1.5–6.5)
NEUT%: 67.1 % (ref 39.6–80.0)
Platelets: 661 10*3/uL — ABNORMAL HIGH (ref 145–400)
RBC: 4.92 10*6/uL (ref 3.70–5.32)
RDW: 13.9 % (ref 11.1–15.7)
WBC: 7.8 10*3/uL (ref 3.9–10.0)

## 2015-06-03 LAB — RETICULOCYTES (CHCC)
ABS RETIC: 79.4 10*3/uL (ref 19.0–186.0)
RBC.: 4.96 MIL/uL (ref 3.87–5.11)
Retic Ct Pct: 1.6 % (ref 0.4–2.3)

## 2015-06-03 LAB — IRON AND TIBC CHCC
%SAT: 17 % — AB (ref 21–57)
Iron: 42 ug/dL (ref 41–142)
TIBC: 245 ug/dL (ref 236–444)
UIBC: 203 ug/dL (ref 120–384)

## 2015-06-03 LAB — FERRITIN CHCC: FERRITIN: 68 ng/mL (ref 9–269)

## 2015-06-03 LAB — CHCC SATELLITE - SMEAR

## 2015-06-03 MED ORDER — FOLIC ACID 1 MG PO TABS: 1.0000 mg | ORAL_TABLET | Freq: Every day | ORAL | Status: DC

## 2015-06-03 MED ORDER — HYDROXYUREA 500 MG PO CAPS: 500.0000 mg | ORAL_CAPSULE | Freq: Two times a day (BID) | ORAL | Status: DC

## 2015-06-03 NOTE — Progress Notes (Signed)
Hematology and Oncology Follow Up Visit  [name] [id number] September 18, 1946 69 y.o. 06/03/2015   Principle Diagnosis:  Essential thrombocythemia-JAK2 POSITIVE Sickle cell trait  Current Therapy:   Hydrea 500 mg by mouth BID  Folic acid 1 mg by mouth daily    Interim History:  [name] is here today for a follow-up. She is feeling better. She is spending a lot of time with her kids. She just returned from a beach trip and is planning to go again soon.  She has had no fever, chills, n/v, rash, dizziness, chest pain, palpitations, abdominal pain, constipation, diarrhea, blood in urine or stool.  No episodes of bleeding or bruising.  She has some SOB with exertion due to asthma. She has also had some issues with allergies and is going to try taking Claritin to see if this helps. She has a dry cough.  Her platelets have increased to 661 so wee will increase her Hydrea to 500 mg BID.  No numbness or tingling in her extremities. No new aches or pains.  Her appetite is better and she is staying hydrated. Her weight is stable.    Medications:    Medication List       This list is accurate as of: 06/03/15  9:35 AM.  Always use your most recent med list.               arformoterol 15 MCG/2ML Nebu  Commonly known as:  BROVANA  Take 2 mLs (15 mcg total) by nebulization 2 (two) times daily.     aspirin EC 81 MG tablet  Take 81 mg by mouth daily.     benzonatate 200 MG capsule  Commonly known as:  TESSALON  Take 1 capsule (200 mg total) by mouth 3 (three) times daily as needed for cough.     budesonide-formoterol 160-4.5 MCG/ACT inhaler  Commonly known as:  SYMBICORT  Inhale 2 puffs into the lungs 2 (two) times daily.     cholecalciferol 1000 UNITS tablet  Commonly known as:  VITAMIN D  Take 1,000 Units by mouth daily.     clonazePAM 0.5 MG tablet  Commonly known as:  KLONOPIN  TAKE 1 TABLET BY MOUTH AS NEEDED TWICE A DAY     fluticasone 50 MCG/ACT nasal spray    Commonly known as:  FLONASE  Place 1 spray into the nose 2 (two) times daily.     HYDROcodone-homatropine 5-1.5 MG/5ML syrup  Commonly known as:  HYCODAN  Take 5 mLs by mouth every 6 (six) hours as needed for cough.     hydroxyurea 500 MG capsule  Commonly known as:  HYDREA  Alternate taking 1 pill one day and 2 pills the next. Example: Monday 1 pill, Tuesday 2 pills, Wednesday 1 pill, Thursday 2 pills, etc.     Magnesium 200 MG Tabs  Take by mouth 2 (two) times daily.     MIRALAX powder  Generic drug:  polyethylene glycol powder  Take 1 Container by mouth as needed.     NON FORMULARY  Take by mouth every morning. BLACK COHASH     omeprazole 20 MG capsule  Commonly known as:  PRILOSEC  TAKE ONE CAPSULE BY MOUTH TWICE DAILY     pravastatin 40 MG tablet  Commonly known as:  PRAVACHOL  Take 40 mg by mouth daily.     predniSONE 10 MG tablet  Commonly known as:  DELTASONE  4 tabs for 2 days, then 3 tabs for 2 days,  2 tabs for 2 days, then 1 tab for 2 days, then stop     PROVENTIL HFA 108 (90 BASE) MCG/ACT inhaler  Generic drug:  albuterol  Inhale 2 puffs into the lungs every 4 (four) hours as needed for wheezing or shortness of breath.     albuterol (2.5 MG/3ML) 0.083% nebulizer solution  Commonly known as:  PROVENTIL  Take 2.5 mg by nebulization every 4 (four) hours as needed for shortness of breath. For shortness of breath.     sertraline 50 MG tablet  Commonly known as:  ZOLOFT  1/2 TABLET ONCE A DAY FOR 10 DAYS THEN 1 TABLET ONCE A DAY 30 DAY(S)     tiotropium 18 MCG inhalation capsule  Commonly known as:  SPIRIVA HANDIHALER  Place 1 capsule (18 mcg total) into inhaler and inhale daily.        Allergies:  Allergies  Allergen Reactions  . Penicillins Itching and Rash    Past Medical History, Surgical history, Social history, and Family History were reviewed and updated.  Review of Systems: All other 10 point review of systems is negative.   Physical  Exam:  height is 5\' 3"  (1.6 m) and weight is 192 lb (87.091 kg). Her oral temperature is 97.5 F (36.4 C). Her blood pressure is 122/69 and her pulse is 84. Her respiration is 18.   Wt Readings from Last 3 Encounters:  06/03/15 192 lb (87.091 kg)  03/31/15 191 lb (86.637 kg)  02/17/15 189 lb (85.73 kg)    Ocular: Sclerae unicteric, pupils equal, round and reactive to light Ear-nose-throat: Oropharynx clear, dentition fair Lymphatic: No cervical or supraclavicular adenopathy Lungs no rales or rhonchi, good excursion bilaterally Heart regular rate and rhythm, no murmur appreciated Abd soft, nontender, positive bowel sounds MSK no focal spinal tenderness, no joint edema Neuro: non-focal, well-oriented, appropriate affect Breasts: Deferred  Lab Results  Component Value Date   WBC 7.8 06/03/2015   HGB 12.9 06/03/2015   HCT 39.2 06/03/2015   MCV 80* 06/03/2015   PLT 661* 06/03/2015   Lab Results  Component Value Date   FERRITIN 116 03/31/2015   IRON 83 03/31/2015   TIBC 241 03/31/2015   UIBC 159 03/31/2015   IRONPCTSAT 34 03/31/2015   Lab Results  Component Value Date   RETICCTPCT 1.5 03/31/2015   RBC 4.92 06/03/2015   RETICCTABS 67.7 03/31/2015   No results found for: KPAFRELGTCHN, LAMBDASER, KAPLAMBRATIO No results found for: Kandis Cocking, IGMSERUM No results found for: Odetta Pink, SPEI   Chemistry      Component Value Date/Time   NA 143 03/31/2015 0850   NA 136 12/12/2014 0549   NA 144 07/21/2014 0940   K 4.1 03/31/2015 0850   K 4.4 12/12/2014 0549   K 3.8 07/21/2014 0940   CL 102 12/12/2014 0549   CL 104 07/21/2014 0940   CO2 22 03/31/2015 0850   CO2 25 12/12/2014 0549   CO2 28 07/21/2014 0940   BUN 12.4 03/31/2015 0850   BUN 22 12/12/2014 0549   BUN 15 07/21/2014 0940   CREATININE 0.9 03/31/2015 0850   CREATININE 0.92 12/12/2014 0549   CREATININE 0.7 07/21/2014 0940      Component Value Date/Time    CALCIUM 9.2 03/31/2015 0850   CALCIUM 8.7 12/12/2014 0549   CALCIUM 9.1 07/21/2014 0940   ALKPHOS 68 03/31/2015 0850   ALKPHOS 65 12/06/2014 1146   ALKPHOS 63 07/21/2014 0940   AST 17 03/31/2015  0850   AST 17 12/06/2014 1146   AST 15 07/21/2014 0940   ALT 15 03/31/2015 0850   ALT 12 12/06/2014 1146   ALT 16 07/21/2014 0940   BILITOT 0.34 03/31/2015 0850   BILITOT 0.3 12/06/2014 1146   BILITOT 0.60 07/21/2014 0940     Impression and Plan: [name] is a 69 year old female with essential thrombocythemia. Her platelets are still increasing so we will increase her Hydrea to 500 mg BID.  She has had no episodes of bleeding or bruising.  She has not been taking folic acid. She will start 1 mg daily today.  We will plan to see her back in 2 months for labs and follow-up.  She knows to call here with any questions or concerns. We can certainly see her sooner if need be.   Eliezer Bottom, NP 6/29/20169:35 AM

## 2015-07-02 ENCOUNTER — Encounter: Payer: Self-pay | Admitting: Emergency Medicine

## 2015-07-02 ENCOUNTER — Ambulatory Visit (INDEPENDENT_AMBULATORY_CARE_PROVIDER_SITE_OTHER): Payer: Medicare Other | Admitting: Emergency Medicine

## 2015-07-02 VITALS — BP 132/72 | HR 93 | Ht 62.0 in | Wt 192.2 lb

## 2015-07-02 DIAGNOSIS — J4520 Mild intermittent asthma, uncomplicated: Secondary | ICD-10-CM

## 2015-07-02 DIAGNOSIS — J309 Allergic rhinitis, unspecified: Secondary | ICD-10-CM | POA: Diagnosis not present

## 2015-07-02 DIAGNOSIS — J383 Other diseases of vocal cords: Secondary | ICD-10-CM | POA: Diagnosis not present

## 2015-07-02 NOTE — Assessment & Plan Note (Signed)
This is the most labile of her problems. I think controlling her rhinitis is key here. I asked her to increase her fluticasone nasal spray twice a day at times when her throat is irritable

## 2015-07-02 NOTE — Patient Instructions (Addendum)
Please continue Symbicort 2 puffs twice a day. Remember to rinse and gargle after using this medication. We will work on getting financial assistance for this medication Use  albuterol 2 puffs as needed for shortness of breath Stop Spiriva Continue loratadine once a day Continue fluticasone nasal spray, 2 sprays each nostril once a day. At times when your drainage and cough are worse you may want to increase this to twice a day Follow with Dr Lamonte Sakai in 4 months or sooner if you have any problems.

## 2015-07-02 NOTE — Progress Notes (Signed)
Subjective:    Patient ID: [name], female    DOB: [birth year]   MRN: [id number] HPI 69 -year-old woman with a history of asthma and vocal cord dysfunction both of which have been significantly impacted by severe esophageal reflux. Has a hx esophageal stricture dilations by Dr Henrene Pastor. On omeprazole 20mg  two times a day. Uses Symbicort bid.   ROV 06/19/12 -- Hx of VCD and asthma that have been exacerbated by GERD and allergic rhinitis. Formerly on chronic steroids but we successfully weaned to off. She tells me that she was admitted to [facility] for HA and CP, L arm numbness - ECG was reassuring. During that admit she had more trouble w her breathing, treated w short burst pred. Still w some allergic sx, taking fluticasone. Also on omeprazole bid. In general her control is OK - using her SABA about 2x a day.   Acute ov 12/10/12 cc much worse since 1/4/7 was doing well on just symbicort but abuptly worse on 1/4 ? p Ran out of symbicort and prilosec only using q am   with poor hfa was dry cough/ wheeze and sob now with min activity.  Was not using any saba  Then since exac using up to every 4 hours.  ROV 12/20/12 -- Hx of VCD and asthma that have been exacerbated by GERD and allergic rhinitis. She saw Dr Melvyn Novas as above, treated for AE of her VCD with prednisone. She states that she never ran out of symbicort or omeprazole. Has also been seen in ED 2 days ago and rx solumedrol and more pred. No other associated sx or factors. She is having UA wheeze, cough (non-productive). She has stayed on the allegra and nasal steroid. Nothing has made this any better - not prednisone, BD's etc.   ROV 01/28/13 -- Hx of VCD and asthma that have been exacerbated by GERD and allergic rhinitis. She has been flaring since early January. We have been trying to aggressively treat allergies and GERD, weaned prednisone and stopped symbicort temporarily. She returns with improvement in her cough and UA wheeze, but she does have residual  cough. Unfortunately she is having more nasal congestion, ? URI. She is on allegra, fluticasone, omeprazole, [place].   ROV 05/08/13 -- labile VCD and asthma, severe GERD and rhinitis. Returns for f/u. Last time we restarted symbicort (stopped temporarily due to UA irritation). She remains on fluticasone, allegra, [place], omeprazole bid. Has required albuterol nebs more frequently. Having more exertional SOB, sudden wheezing with exertion.   Follow up 07/31/13 Patient returns for a one-week followup. She was seen last week for an acute office visit for asthmatic flare. She was treated with a one-week course of prednisone taper. Patient reports that she is improved, however continues to have intermittent wheezing, and cough. She denies any discolored mucus, or fever, chest pain, orthopnea, PND, or leg swelling. Still has drainage in throat.  ROV 09/11/13 -- labile VCD and asthma, severe GERD and rhinitis. Was seen by TP in 8/14 for an acute flare, treated w pred. Returns today reporting that she is better. She notes that the prednisone gave her a vaginal rash, probably yeast infxn. She is on symbicort, albuterol prn. She is having wheeze in the am and mid-afternoon. Albuterol helps   Acute OV 12/31/13 -- labile VCD and asthma, severe GERD and rhinitis. She presents today with an acute flare > she had been well until yesterday when  She developed more cough, dyspnea, UA noise. Of note she just  started hydrea for her essential thrombocytopenia. She can't identify any discrete trigger that set her off. No Uri sx or sick contacts.   ROV 04/10/14 -- f/u for VCD and asthma, GERD, rhinitis. She improved after we treated her for a flare in January, no w better. She has had allergy sx, sneezing, burning eyes. Some wheeze and cough. Taking fluticasone bid.   08/08/14 Follow Up VCD/asthma/GERD [name] Pt complains of  increased SOB, nonprod cough X2 weeks. Some nasal drainage  No fever, discolored mucus, edema or hemoptysis. No  recent travel or abx use.  Doing well until last couple of weeks.  Interested in [place].  We discussed flu shot .  Husband died 2 weeks ago from MI , under some stress .  Needs refill of symbicort .   post hospital follow-up- VCD\asthma.\GERD\allergic rhinitis Patient presented for a post hospital follow-up. Patient was admitted January 2 to January 9 for a asthma exacerbation She was treated with aggressive course of IV antibiotics, steroids, and nebulized bronchodilators. CT chest was negative for pulmonary embolism but did show a left lower lobe segmental collapse She did require bronchoscopy with clearance of mucus plugging. Since discharge. Patient is feeling improved.  She denies any chest pain, orthopnea, PND or leg swelling Does complain of a rash along her right side of her buttocks. Complains that it burns and is painful to sit  ROV 07/02/15 -- follow-up visit for vocal cord dysfunction and chronic cough in the setting of allergic rhinitis and GERD. She likely also has true asthma with a lot of crossover.  She is having daily symptoms, happens in the am and also when she tries to exert her self. She has been maintained on Symbicort, also has Spiriva since hospitalization but not using reliably. Uses albuterol prn, not every day. Remains on flonase qd, loratadine qd. She has occasional paroxysmal cough for 2-3 days, not currently.   ROS:  As per HPI.    Objective:   Physical Exam Filed Vitals:   07/02/15 1158  BP: 132/72  Pulse: 93  Height: 5\' 2"  (1.575 m)  Weight: 192 lb 3.2 oz (87.181 kg)  SpO2: 97%   Gen: Pleasant, overwt, in no distress,  normal affect,   ENT: No lesions,  mouth clear,  oropharynx clear, no postnasal drip  Neck: supple no JVD, no stridor   Lungs : Decreased BS w/ no wheezing   Cardiovascular: RRR, heart sounds normal, no murmur or gallops, no peripheral edema  Musculoskeletal: No deformities, no cyanosis or clubbing  Neuro: alert, non  focal  Skin: Warm, along the right upper buttocks noted grouped vesicles consistent with herpes zoster.    Assessment & Plan:   Intrinsic asthma Well-controlled at this time. He does have daily symptoms and clearly needs her controller medications. I do not believe she'll benefit from Spiriva and we will stop this. I will take brovana off her medicine list.   Please continue Symbicort 2 puffs twice a day. Remember to rinse and gargle after using this medication. We will work on getting financial assistance for this medication Use  albuterol 2 puffs as needed for shortness of breath Stop Spiriva Continue loratadine once a day Continue fluticasone nasal spray, 2 sprays each nostril once a day. At times when your drainage and cough are worse you may want to increase this to twice a day Follow with Dr Lamonte Sakai in 4 months or sooner if you have any problems.  Vocal cord dysfunction This is the most labile of  her problems. I think controlling her rhinitis is key here. I asked her to increase her fluticasone nasal spray twice a day at times when her throat is irritable  Allergic rhinitis Continue loratadine and fluticasone as detailed above

## 2015-07-02 NOTE — Assessment & Plan Note (Signed)
Continue loratadine and fluticasone as detailed above

## 2015-07-02 NOTE — Assessment & Plan Note (Signed)
Well-controlled at this time. He does have daily symptoms and clearly needs her controller medications. I do not believe she'll benefit from Spiriva and we will stop this. I will take brovana off her medicine list.   Please continue Symbicort 2 puffs twice a day. Remember to rinse and gargle after using this medication. We will work on getting financial assistance for this medication Use  albuterol 2 puffs as needed for shortness of breath Stop Spiriva Continue loratadine once a day Continue fluticasone nasal spray, 2 sprays each nostril once a day. At times when your drainage and cough are worse you may want to increase this to twice a day Follow with Dr Lamonte Sakai in 4 months or sooner if you have any problems.

## 2015-07-06 ENCOUNTER — Telehealth: Payer: Self-pay | Admitting: Emergency Medicine

## 2015-07-06 NOTE — Telephone Encounter (Signed)
Spoke with pt, has filled out application form for [employer]. This has been given to [place] to follow up on. Will route to [place].

## 2015-07-06 NOTE — Telephone Encounter (Signed)
Pt will have to redo the forms. She signed on the line where RB is supposed to sign. Pt is aware. New forms will be placed at the front for pick up.

## 2015-07-20 ENCOUNTER — Telehealth: Payer: Self-pay | Admitting: Emergency Medicine

## 2015-07-20 NOTE — Telephone Encounter (Signed)
Forms have been received and filled out. RB is in the office today and will be signed. Once signed I will fax to [employer].

## 2015-08-06 ENCOUNTER — Encounter: Payer: Self-pay | Admitting: Hematology & Oncology

## 2015-08-06 ENCOUNTER — Other Ambulatory Visit (HOSPITAL_BASED_OUTPATIENT_CLINIC_OR_DEPARTMENT_OTHER): Payer: Medicare Other

## 2015-08-06 ENCOUNTER — Ambulatory Visit (HOSPITAL_BASED_OUTPATIENT_CLINIC_OR_DEPARTMENT_OTHER): Payer: Medicare Other | Admitting: Hematology & Oncology

## 2015-08-06 VITALS — BP 137/71 | HR 82 | Temp 97.9°F | Resp 16 | Ht 62.0 in | Wt 190.0 lb

## 2015-08-06 DIAGNOSIS — D573 Sickle-cell trait: Secondary | ICD-10-CM

## 2015-08-06 DIAGNOSIS — D473 Essential (hemorrhagic) thrombocythemia: Secondary | ICD-10-CM | POA: Diagnosis not present

## 2015-08-06 LAB — COMPREHENSIVE METABOLIC PANEL
ALT: 11 U/L (ref 6–29)
AST: 13 U/L (ref 10–35)
Albumin: 3.7 g/dL (ref 3.6–5.1)
Alkaline Phosphatase: 64 U/L (ref 33–130)
BILIRUBIN TOTAL: 0.5 mg/dL (ref 0.2–1.2)
BUN: 11 mg/dL (ref 7–25)
CO2: 25 mmol/L (ref 20–31)
CREATININE: 0.88 mg/dL (ref 0.60–0.93)
Calcium: 9.2 mg/dL (ref 8.6–10.4)
Chloride: 107 mmol/L (ref 98–110)
GLUCOSE: 100 mg/dL — AB (ref 65–99)
Potassium: 4.4 mmol/L (ref 3.5–5.3)
SODIUM: 141 mmol/L (ref 135–146)
Total Protein: 6.1 g/dL (ref 6.1–8.1)

## 2015-08-06 LAB — CBC WITH DIFFERENTIAL (CANCER CENTER ONLY)
BASO#: 0 10*3/uL (ref 0.0–0.2)
BASO%: 0.5 % (ref 0.0–2.0)
EOS%: 2.3 % (ref 0.0–7.0)
Eosinophils Absolute: 0.1 10*3/uL (ref 0.0–0.5)
HEMATOCRIT: 39.3 % (ref 34.8–46.6)
HGB: 12.9 g/dL (ref 11.6–15.9)
LYMPH#: 1.5 10*3/uL (ref 0.9–3.3)
LYMPH%: 25.7 % (ref 14.0–48.0)
MCH: 24.7 pg — AB (ref 26.0–34.0)
MCHC: 32.8 g/dL (ref 32.0–36.0)
MCV: 75 fL — AB (ref 81–101)
MONO#: 0.5 10*3/uL (ref 0.1–0.9)
MONO%: 8.1 % (ref 0.0–13.0)
NEUT#: 3.8 10*3/uL (ref 1.5–6.5)
NEUT%: 63.4 % (ref 39.6–80.0)
PLATELETS: 597 10*3/uL — AB (ref 145–400)
RBC: 5.23 10*6/uL (ref 3.70–5.32)
RDW: 16.2 % — AB (ref 11.1–15.7)
WBC: 6 10*3/uL (ref 3.9–10.0)

## 2015-08-06 LAB — LACTATE DEHYDROGENASE: LDH: 238 U/L (ref 94–250)

## 2015-08-06 LAB — FERRITIN CHCC: FERRITIN: 81 ng/mL (ref 9–269)

## 2015-08-06 LAB — IRON AND TIBC CHCC
%SAT: 41 % (ref 21–57)
IRON: 96 ug/dL (ref 41–142)
TIBC: 238 ug/dL (ref 236–444)
UIBC: 141 ug/dL (ref 120–384)

## 2015-08-06 LAB — RETICULOCYTES (CHCC)
ABS Retic: 73.1 10*3/uL (ref 19.0–186.0)
RBC.: 5.22 MIL/uL — ABNORMAL HIGH (ref 3.87–5.11)
Retic Ct Pct: 1.4 % (ref 0.4–2.3)

## 2015-08-06 LAB — CHCC SATELLITE - SMEAR

## 2015-08-06 NOTE — Progress Notes (Signed)
Hematology and Oncology Follow Up Visit  [name] [id number] 1945-12-19 69 y.o. 08/06/2015   Principle Diagnosis:   Essential thrombocythemia-JAK2 POSITIVE  Sickle cell trait  Current Therapy:    Hydrea 500mg  by mouth TID  Aspirin 81 mg by mouth daily  Folic acid 1 mg by mouth daily     Interim History:  Ms.  Banke is back for followup. She did have a some chest pain. This going on for about a week or so. She's not let anybody know about this. The pain is a sharp type pain. It is not brought on by activity or eating. It is not radiate. She thinks it might be reflux. She already is on Prilosec.  She's had no sweats. She's had no nausea or vomiting with it. When she gets it, it lasts about 30 minutes and then goes away.  She is taking her baby aspirin. She is taking folic acid.  She's had no bleeding. She has had no fever.  Otherwise, she's had a good summer. She's been vacationing in [place] and also on the [employer].   Overall, her performance status is ECOG 1.   Medications:  Current outpatient prescriptions:  .  albuterol (PROVENTIL HFA) 108 (90 BASE) MCG/ACT inhaler, Inhale 2 puffs into the lungs every 4 (four) hours as needed for wheezing or shortness of breath. , Disp: , Rfl:  .  albuterol (PROVENTIL) (2.5 MG/3ML) 0.083% nebulizer solution, Take 2.5 mg by nebulization every 4 (four) hours as needed for shortness of breath. For shortness of breath., Disp: , Rfl:  .  aspirin EC 81 MG tablet, Take 81 mg by mouth daily., Disp: , Rfl:  .  budesonide-formoterol (SYMBICORT) 160-4.5 MCG/ACT inhaler, Inhale 2 puffs into the lungs 2 (two) times daily., Disp: 10.2 g, Rfl: 5 .  cholecalciferol (VITAMIN D) 1000 UNITS tablet, Take 1,000 Units by mouth daily., Disp: , Rfl:  .  clonazePAM (KLONOPIN) 0.5 MG tablet, TAKE 1 TABLET BY MOUTH AS NEEDED TWICE A DAY, Disp: , Rfl: 0 .  fluticasone (FLONASE) 50 MCG/ACT nasal spray, Place 1 spray into the nose 2 (two) times daily.  , Disp: , Rfl:  .  folic acid (FOLVITE) 1 MG tablet, Take 1 tablet (1 mg total) by mouth daily., Disp: 90 tablet, Rfl: 3 .  hydroxyurea (HYDREA) 500 MG capsule, Take 1 capsule (500 mg total) by mouth 2 (two) times daily., Disp: 180 capsule, Rfl: 3 .  Magnesium 200 MG TABS, Take by mouth 2 (two) times daily., Disp: , Rfl:  .  meloxicam (MOBIC) 15 MG tablet, Take 1 tablet by mouth daily., Disp: , Rfl: 2 .  NON FORMULARY, Take by mouth every morning. BLACK COHASH, Disp: , Rfl:  .  omeprazole (PRILOSEC) 20 MG capsule, TAKE ONE CAPSULE BY MOUTH TWICE DAILY, Disp: 60 capsule, Rfl: 5 .  polyethylene glycol powder (MIRALAX) powder, Take 1 Container by mouth as needed., Disp: , Rfl:  .  pravastatin (PRAVACHOL) 40 MG tablet, Take 40 mg by mouth daily., Disp: , Rfl:  .  sertraline (ZOLOFT) 50 MG tablet, 1/2 TABLET ONCE A DAY FOR 10 DAYS THEN 1 TABLET ONCE A DAY 30 DAY(S), Disp: , Rfl: 3 .  tiotropium (SPIRIVA HANDIHALER) 18 MCG inhalation capsule, Place 1 capsule (18 mcg total) into inhaler and inhale daily., Disp: 30 capsule, Rfl: 12  Allergies:  Allergies  Allergen Reactions  . Penicillins Itching and Rash    Past Medical History, Surgical history, Social history, and Family  History were reviewed and updated.  Review of Systems: As above  Physical Exam:  height is 5\' 2"  (1.575 m) and weight is 190 lb (86.183 kg). Her oral temperature is 97.9 F (36.6 C). Her blood pressure is 137/71 and her pulse is 82. Her respiration is 16.   Well-developed and well-nourished [place] female. Head and neck exam shows no ocular or oral lesions. She has no palpable cervical or supraclavicular lymph nodes. Lungs are clear bilaterally. No wheezes, rales or rhonchi are noted. Cardiac exam regular rate and rhythm with no murmurs, rubs or bruits. Abdomen is soft. She has good bowel sounds. There is no palpable liver or spleen tip. Extremities shows no clubbing, cyanosis or edema. She has good range of motion of  her joints. Skin exam shows no rashes, ecchymosis or petechia. Neurological exam is non-focal.  Lab Results  Component Value Date   WBC 6.0 08/06/2015   HGB 12.9 08/06/2015   HCT 39.3 08/06/2015   MCV 75* 08/06/2015   PLT 597* 08/06/2015     Chemistry      Component Value Date/Time   NA 141 06/03/2015 0837   NA 143 03/31/2015 0850   NA 144 07/21/2014 0940   K 4.8 06/03/2015 0837   K 4.1 03/31/2015 0850   K 3.8 07/21/2014 0940   CL 105 06/03/2015 0837   CL 104 07/21/2014 0940   CO2 25 06/03/2015 0837   CO2 22 03/31/2015 0850   CO2 28 07/21/2014 0940   BUN 17 06/03/2015 0837   BUN 12.4 03/31/2015 0850   BUN 15 07/21/2014 0940   CREATININE 0.86 06/03/2015 0837   CREATININE 0.9 03/31/2015 0850   CREATININE 0.7 07/21/2014 0940      Component Value Date/Time   CALCIUM 8.8 06/03/2015 0837   CALCIUM 9.2 03/31/2015 0850   CALCIUM 9.1 07/21/2014 0940   ALKPHOS 65 06/03/2015 0837   ALKPHOS 68 03/31/2015 0850   ALKPHOS 63 07/21/2014 0940   AST 15 06/03/2015 0837   AST 17 03/31/2015 0850   AST 15 07/21/2014 0940   ALT 17 06/03/2015 0837   ALT 15 03/31/2015 0850   ALT 16 07/21/2014 0940   BILITOT 0.3 06/03/2015 0837   BILITOT 0.34 03/31/2015 0850   BILITOT 0.60 07/21/2014 0940         Impression and Plan: [name] is 69 year old female with essential thrombocythemia. I will go ahead and increase her dose up to 500 mg 3 times a day.  I am not sure what is the etiology of her chest discomfort. It does not sound cardiac. She does have cardiac risk factors. I told her that if this happens again, that she needs to go to emergency room. She probably needs to have an EKG done  I want to see her back in one month. I want to see how the increased dose of Hydrea does for her thrombocytosis.  We are also checking her iron studies.  She does have the sickle cell trait that she is taking folic acid   I spent over half an hour with her.[name],  MD 9/1/20168:32 AM

## 2015-08-24 ENCOUNTER — Telehealth: Payer: Self-pay | Admitting: Emergency Medicine

## 2015-08-25 NOTE — Telephone Encounter (Signed)
Forms have been filled out. Will have RB sign them today while he is in office.

## 2015-08-25 NOTE — Telephone Encounter (Signed)
Forms have been completed and faxed to [employer]. Will leave message open to follow up on.

## 2015-08-27 NOTE — Telephone Encounter (Signed)
Forms given to patient daughter along with 1 sample. Nothing further needed

## 2015-08-27 NOTE — Telephone Encounter (Signed)
IN LOBBY to pick up form and she is also requesting a sample of symbicort

## 2015-09-10 ENCOUNTER — Other Ambulatory Visit (HOSPITAL_BASED_OUTPATIENT_CLINIC_OR_DEPARTMENT_OTHER): Payer: Medicare Other

## 2015-09-10 ENCOUNTER — Ambulatory Visit (HOSPITAL_BASED_OUTPATIENT_CLINIC_OR_DEPARTMENT_OTHER): Payer: Medicare Other | Admitting: Hematology & Oncology

## 2015-09-10 ENCOUNTER — Encounter: Payer: Self-pay | Admitting: Hematology & Oncology

## 2015-09-10 VITALS — BP 138/75 | HR 79 | Temp 98.1°F | Resp 14 | Ht 62.0 in | Wt 190.0 lb

## 2015-09-10 DIAGNOSIS — D473 Essential (hemorrhagic) thrombocythemia: Secondary | ICD-10-CM | POA: Diagnosis not present

## 2015-09-10 LAB — CBC WITH DIFFERENTIAL (CANCER CENTER ONLY)
BASO#: 0 10*3/uL (ref 0.0–0.2)
BASO%: 0.2 % (ref 0.0–2.0)
EOS ABS: 0.1 10*3/uL (ref 0.0–0.5)
EOS%: 2.4 % (ref 0.0–7.0)
HCT: 35.4 % (ref 34.8–46.6)
HEMOGLOBIN: 11.8 g/dL (ref 11.6–15.9)
LYMPH#: 1.6 10*3/uL (ref 0.9–3.3)
LYMPH%: 34 % (ref 14.0–48.0)
MCH: 25.3 pg — ABNORMAL LOW (ref 26.0–34.0)
MCHC: 33.3 g/dL (ref 32.0–36.0)
MCV: 76 fL — ABNORMAL LOW (ref 81–101)
MONO#: 0.3 10*3/uL (ref 0.1–0.9)
MONO%: 5.9 % (ref 0.0–13.0)
NEUT%: 57.5 % (ref 39.6–80.0)
NEUTROS ABS: 2.6 10*3/uL (ref 1.5–6.5)
PLATELETS: 292 10*3/uL (ref 145–400)
RBC: 4.67 10*6/uL (ref 3.70–5.32)
RDW: 18.7 % — ABNORMAL HIGH (ref 11.1–15.7)
WBC: 4.6 10*3/uL (ref 3.9–10.0)

## 2015-09-10 LAB — CHCC SATELLITE - SMEAR

## 2015-09-10 NOTE — Progress Notes (Signed)
Hematology and Oncology Follow Up Visit  [name] [id number] September 01, 1946 69 y.o. 09/10/2015   Principle Diagnosis:   Essential thrombocythemia-JAK2 POSITIVE  Sickle cell trait  Current Therapy:    Hydrea 500mg  by mouth TID  Aspirin 81 mg by mouth daily  Folic acid 1 mg by mouth daily     Interim History:  [name] is back for followup. She is looking quite good. She feels good. She is on 3 times a day dosing of Hydrea. This is agreeing with her.  She's getting ready to go on a cruise to the [place]. She will be going in about a month.  Otherwise, she has had no problems. She's had no bleeding. She's had no fever. She has had no change in bowel or bladder habits.  She's not noted any problems with chest wall pain. She has had this in the past.  There's been no nausea or vomiting. She's had a good appetite.  She has occasional discomfort in the left forearm. There is no swelling. She says this seems to have no "rhyme or reason" for current.  Overall, her performance status is ECOG 1.   Medications:  Current outpatient prescriptions:  .  albuterol (PROVENTIL HFA) 108 (90 BASE) MCG/ACT inhaler, Inhale 2 puffs into the lungs every 4 (four) hours as needed for wheezing or shortness of breath. , Disp: , Rfl:  .  albuterol (PROVENTIL) (2.5 MG/3ML) 0.083% nebulizer solution, Take 2.5 mg by nebulization every 4 (four) hours as needed for shortness of breath. For shortness of breath., Disp: , Rfl:  .  aspirin EC 81 MG tablet, Take 81 mg by mouth daily., Disp: , Rfl:  .  budesonide-formoterol (SYMBICORT) 160-4.5 MCG/ACT inhaler, Inhale 2 puffs into the lungs 2 (two) times daily., Disp: 10.2 g, Rfl: 5 .  cholecalciferol (VITAMIN D) 1000 UNITS tablet, Take 1,000 Units by mouth daily., Disp: , Rfl:  .  clonazePAM (KLONOPIN) 0.5 MG tablet, TAKE 1 TABLET BY MOUTH AS NEEDED TWICE A DAY, Disp: , Rfl: 0 .  fluticasone (FLONASE) 50 MCG/ACT nasal spray, Place 1 spray into the nose 2  (two) times daily. , Disp: , Rfl:  .  folic acid (FOLVITE) 1 MG tablet, Take 1 tablet (1 mg total) by mouth daily., Disp: 90 tablet, Rfl: 3 .  hydroxyurea (HYDREA) 500 MG capsule, Take 1 capsule (500 mg total) by mouth 2 (two) times daily., Disp: 180 capsule, Rfl: 3 .  Magnesium 200 MG TABS, Take by mouth 2 (two) times daily., Disp: , Rfl:  .  meloxicam (MOBIC) 15 MG tablet, Take 1 tablet by mouth daily., Disp: , Rfl: 2 .  NON FORMULARY, Take by mouth every morning. BLACK COHASH, Disp: , Rfl:  .  omeprazole (PRILOSEC) 20 MG capsule, TAKE ONE CAPSULE BY MOUTH TWICE DAILY, Disp: 60 capsule, Rfl: 5 .  polyethylene glycol powder (MIRALAX) powder, Take 1 Container by mouth as needed., Disp: , Rfl:  .  pravastatin (PRAVACHOL) 40 MG tablet, Take 40 mg by mouth daily., Disp: , Rfl:  .  sertraline (ZOLOFT) 50 MG tablet, 1/2 TABLET ONCE A DAY FOR 10 DAYS THEN 1 TABLET ONCE A DAY 30 DAY(S), Disp: , Rfl: 3 .  tiotropium (SPIRIVA HANDIHALER) 18 MCG inhalation capsule, Place 1 capsule (18 mcg total) into inhaler and inhale daily., Disp: 30 capsule, Rfl: 12  Allergies:  Allergies  Allergen Reactions  . Penicillins Itching and Rash    Past Medical History, Surgical history, Social history, and Family History  were reviewed and updated.  Review of Systems: As above  Physical Exam:  height is 5\' 2"  (1.575 m) and weight is 190 lb (86.183 kg). Her oral temperature is 98.1 F (36.7 C). Her blood pressure is 138/75 and her pulse is 79. Her respiration is 14.   Well-developed and well-nourished [place] female. Head and neck exam shows no ocular or oral lesions. She has no palpable cervical or supraclavicular lymph nodes. Lungs are clear bilaterally. No wheezes, rales or rhonchi are noted. Cardiac exam regular rate and rhythm with no murmurs, rubs or bruits. Abdomen is soft. She has good bowel sounds. There is no palpable liver or spleen tip. Extremities shows no clubbing, cyanosis or edema. She has good  range of motion of her joints. Skin exam shows no rashes, ecchymosis or petechia. Neurological exam is non-focal.  Lab Results  Component Value Date   WBC 4.6 09/10/2015   HGB 11.8 09/10/2015   HCT 35.4 09/10/2015   MCV 76* 09/10/2015   PLT 292 09/10/2015     Chemistry      Component Value Date/Time   NA 141 08/06/2015 0800   NA 143 03/31/2015 0850   NA 144 07/21/2014 0940   K 4.4 08/06/2015 0800   K 4.1 03/31/2015 0850   K 3.8 07/21/2014 0940   CL 107 08/06/2015 0800   CL 104 07/21/2014 0940   CO2 25 08/06/2015 0800   CO2 22 03/31/2015 0850   CO2 28 07/21/2014 0940   BUN 11 08/06/2015 0800   BUN 12.4 03/31/2015 0850   BUN 15 07/21/2014 0940   CREATININE 0.88 08/06/2015 0800   CREATININE 0.9 03/31/2015 0850   CREATININE 0.7 07/21/2014 0940      Component Value Date/Time   CALCIUM 9.2 08/06/2015 0800   CALCIUM 9.2 03/31/2015 0850   CALCIUM 9.1 07/21/2014 0940   ALKPHOS 64 08/06/2015 0800   ALKPHOS 68 03/31/2015 0850   ALKPHOS 63 07/21/2014 0940   AST 13 08/06/2015 0800   AST 17 03/31/2015 0850   AST 15 07/21/2014 0940   ALT 11 08/06/2015 0800   ALT 15 03/31/2015 0850   ALT 16 07/21/2014 0940   BILITOT 0.5 08/06/2015 0800   BILITOT 0.34 03/31/2015 0850   BILITOT 0.60 07/21/2014 0940         Impression and Plan: [name] is 69 year old female with essential thrombocythemia. I will go ahead and increase her dose up to 500 mg 3 times a day.  I am thankful that her platelet count is doing much better. As such, we will continue her on the 3 times a day Hydrea.  I will plan to get her back now in 2 months. I think this would be reasonable.  I told her to make sure that she takes her folic acid for the sickle cell trait.  She is taking baby aspirin.    spent over half an hour with her.[name], MD 10/6/20169:22 AM

## 2015-09-14 ENCOUNTER — Ambulatory Visit (INDEPENDENT_AMBULATORY_CARE_PROVIDER_SITE_OTHER): Payer: Medicare Other

## 2015-09-14 DIAGNOSIS — Z23 Encounter for immunization: Secondary | ICD-10-CM

## 2015-10-27 ENCOUNTER — Other Ambulatory Visit: Payer: Self-pay | Admitting: *Deleted

## 2015-10-27 DIAGNOSIS — D573 Sickle-cell trait: Secondary | ICD-10-CM

## 2015-10-27 DIAGNOSIS — D473 Essential (hemorrhagic) thrombocythemia: Secondary | ICD-10-CM

## 2015-10-27 MED ORDER — HYDROXYUREA 500 MG PO CAPS: 500.0000 mg | ORAL_CAPSULE | Freq: Three times a day (TID) | ORAL | Status: DC

## 2015-11-03 ENCOUNTER — Other Ambulatory Visit: Payer: Self-pay | Admitting: Emergency Medicine

## 2015-11-03 ENCOUNTER — Ambulatory Visit (INDEPENDENT_AMBULATORY_CARE_PROVIDER_SITE_OTHER): Payer: Medicare Other | Admitting: Emergency Medicine

## 2015-11-03 ENCOUNTER — Encounter: Payer: Self-pay | Admitting: Emergency Medicine

## 2015-11-03 VITALS — BP 126/76 | HR 86 | Ht 62.0 in | Wt 191.0 lb

## 2015-11-03 DIAGNOSIS — J4520 Mild intermittent asthma, uncomplicated: Secondary | ICD-10-CM | POA: Diagnosis not present

## 2015-11-03 DIAGNOSIS — R079 Chest pain, unspecified: Secondary | ICD-10-CM

## 2015-11-03 DIAGNOSIS — J383 Other diseases of vocal cords: Secondary | ICD-10-CM | POA: Diagnosis not present

## 2015-11-03 DIAGNOSIS — J309 Allergic rhinitis, unspecified: Secondary | ICD-10-CM

## 2015-11-03 NOTE — Assessment & Plan Note (Signed)
Fairly well controlled at this time. Continue her current fluticasone nasal spray

## 2015-11-03 NOTE — Progress Notes (Signed)
Subjective:    Patient ID: [name], female    DOB: [birth year]   MRN: [id number] HPI 69 -year-old woman with a history of asthma and vocal cord dysfunction both of which have been significantly impacted by severe esophageal reflux. Has a hx esophageal stricture dilations by Dr Henrene Pastor. On omeprazole 20mg  two times a day. Uses Symbicort bid.   ROV 06/19/12 -- Hx of VCD and asthma that have been exacerbated by GERD and allergic rhinitis. Formerly on chronic steroids but we successfully weaned to off. She tells me that she was admitted to [facility] for HA and CP, L arm numbness - ECG was reassuring. During that admit she had more trouble w her breathing, treated w short burst pred. Still w some allergic sx, taking fluticasone. Also on omeprazole bid. In general her control is OK - using her SABA about 2x a day.   Acute ov 12/10/12 cc much worse since 1/4/7 was doing well on just symbicort but abuptly worse on 1/4 ? p Ran out of symbicort and prilosec only using q am   with poor hfa was dry cough/ wheeze and sob now with min activity.  Was not using any saba  Then since exac using up to every 4 hours.  ROV 12/20/12 -- Hx of VCD and asthma that have been exacerbated by GERD and allergic rhinitis. She saw Dr Melvyn Novas as above, treated for AE of her VCD with prednisone. She states that she never ran out of symbicort or omeprazole. Has also been seen in ED 2 days ago and rx solumedrol and more pred. No other associated sx or factors. She is having UA wheeze, cough (non-productive). She has stayed on the allegra and nasal steroid. Nothing has made this any better - not prednisone, BD's etc.   ROV 01/28/13 -- Hx of VCD and asthma that have been exacerbated by GERD and allergic rhinitis. She has been flaring since early January. We have been trying to aggressively treat allergies and GERD, weaned prednisone and stopped symbicort temporarily. She returns with improvement in her cough and UA wheeze, but she does have residual  cough. Unfortunately she is having more nasal congestion, ? URI. She is on allegra, fluticasone, omeprazole, [place].   ROV 05/08/13 -- labile VCD and asthma, severe GERD and rhinitis. Returns for f/u. Last time we restarted symbicort (stopped temporarily due to UA irritation). She remains on fluticasone, allegra, NSW, omeprazole bid. Has required albuterol nebs more frequently. Having more exertional SOB, sudden wheezing with exertion.   Follow up 07/31/13 Patient returns for a one-week followup. She was seen last week for an acute office visit for asthmatic flare. She was treated with a one-week course of prednisone taper. Patient reports that she is improved, however continues to have intermittent wheezing, and cough. She denies any discolored mucus, or fever, chest pain, orthopnea, PND, or leg swelling. Still has drainage in throat.  ROV 09/11/13 -- labile VCD and asthma, severe GERD and rhinitis. Was seen by TP in 8/14 for an acute flare, treated w pred. Returns today reporting that she is better. She notes that the prednisone gave her a vaginal rash, probably yeast infxn. She is on symbicort, albuterol prn. She is having wheeze in the am and mid-afternoon. Albuterol helps   Acute OV 12/31/13 -- labile VCD and asthma, severe GERD and rhinitis. She presents today with an acute flare > she had been well until yesterday when  She developed more cough, dyspnea, UA noise. Of note she just  started hydrea for her essential thrombocytopenia. She can't identify any discrete trigger that set her off. No Uri sx or sick contacts.   ROV 04/10/14 -- f/u for VCD and asthma, GERD, rhinitis. She improved after we treated her for a flare in January, no w better. She has had allergy sx, sneezing, burning eyes. Some wheeze and cough. Taking fluticasone bid.   08/08/14 Follow Up VCD/asthma/GERD [name] Pt complains of  increased SOB, nonprod cough X2 weeks. Some nasal drainage  No fever, discolored mucus, edema or hemoptysis. No  recent travel or abx use.  Doing well until last couple of weeks.  Interested in [place].  We discussed flu shot .  Husband died 2 weeks ago from MI , under some stress .  Needs refill of symbicort .   post hospital follow-up- VCD\asthma.\GERD\allergic rhinitis Patient presented for a post hospital follow-up. Patient was admitted January 2 to January 9 for a asthma exacerbation She was treated with aggressive course of IV antibiotics, steroids, and nebulized bronchodilators. CT chest was negative for pulmonary embolism but did show a left lower lobe segmental collapse She did require bronchoscopy with clearance of mucus plugging. Since discharge. Patient is feeling improved.  She denies any chest pain, orthopnea, PND or leg swelling Does complain of a rash along her right side of her buttocks. Complains that it burns and is painful to sit  ROV 07/02/15 -- follow-up visit for vocal cord dysfunction and chronic cough in the setting of allergic rhinitis and GERD. She likely also has true asthma with a lot of crossover.  She is having daily symptoms, happens in the am and also when she tries to exert her self. She has been maintained on Symbicort, also has Spiriva since hospitalization but not using reliably. Uses albuterol prn, not every day. Remains on flonase qd, loratadine qd. She has occasional paroxysmal cough for 2-3 days, not currently.   ROV 11/03/15 -- follow-up visit for chronic cough and vocal cord dysfunction in the setting of GERD and allergic rhinitis. Her pulmonary function testing is consistent with possible trigger bronchospasm as well. Currently maintained on Symbicort, stopped Spiriva last time. She tells me that she has been more SOB for the last 4-5 months.  Happens when she wakes up in the am, when she walks about 50 feet. She is able to shop with a cart. Her wheeze is currently well controlled. She is having some cough, had an episode in [place] 3 days ago of severe cough then  dyspnea, better with albuterol neb. On flonase reliably - rhinitis and upper airway irritation appear to be fairly stable at this time  ROS:  As per HPI.    Objective:   Physical Exam Filed Vitals:   11/03/15 1037 11/03/15 1038  BP:  126/76  Pulse:  86  Height: 5\' 2"  (1.575 m)   Weight: 191 lb (86.637 kg)   SpO2:  99%   Gen: Pleasant, overwt, in no distress,  normal affect,   ENT: No lesions,  mouth clear,  oropharynx clear, no postnasal drip  Neck: supple no JVD, very mild inspiratory stridor   Lungs : Decreased BS w/ no wheezing   Cardiovascular: RRR, heart sounds normal, no murmur or gallops, no peripheral edema  Musculoskeletal: No deformities, no cyanosis or clubbing  Neuro: alert, non focal  Skin: Warm,     Assessment & Plan:   Allergic rhinitis Fairly well controlled at this time. Continue her current fluticasone nasal spray  Vocal cord dysfunction This is  her principal problem and is certainly exacerbated by spells of coughing. It has been under surprisingly good control recently. Still she complains of exertional dyspnea, nocturnal dyspnea and some exertional chest pain. I believe her vocal cord dysfunction is actually better controlled now than in her multiple previous visits  Chest pain She describes exertional chest pain that gets better with rest and which can sometimes affect her left arm. This is a difficult case given her vocal cord dysfunction, her deconditioning, her anxiety etc.. She had a reassuring Myoview in 2012. I believe she likely needs follow-up with cardiology to decide if any other testing is indicated given her description of the exertional chest pain and good control of her upper airway disease currently.   Intrinsic asthma Continue Symbicort at this time, cost will be an issue we will try to supplement her with samples

## 2015-11-03 NOTE — Assessment & Plan Note (Signed)
She describes exertional chest pain that gets better with rest and which can sometimes affect her left arm. This is a difficult case given her vocal cord dysfunction, her deconditioning, her anxiety etc.. She had a reassuring Myoview in 2012. I believe she likely needs follow-up with cardiology to decide if any other testing is indicated given her description of the exertional chest pain and good control of her upper airway disease currently.

## 2015-11-03 NOTE — Assessment & Plan Note (Signed)
Continue Symbicort at this time, cost will be an issue we will try to supplement her with samples

## 2015-11-03 NOTE — Assessment & Plan Note (Signed)
This is her principal problem and is certainly exacerbated by spells of coughing. It has been under surprisingly good control recently. Still she complains of exertional dyspnea, nocturnal dyspnea and some exertional chest pain. I believe her vocal cord dysfunction is actually better controlled now than in her multiple previous visits

## 2015-11-03 NOTE — Patient Instructions (Signed)
Please continue Symbicort 2 puffs twice a day. We will try to get shoe samples of this medication to defray costs Continue to use albuterol if needed for shortness of breath Continue your fluticasone nasal spray We will refer you for a follow-up evaluation in cardiology regarding your chest discomfort since your last visit was in 2012 Follow with Dr Lamonte Sakai in 4 months or sooner if you have any problems.

## 2015-11-03 NOTE — Addendum Note (Signed)
Addended by: Jerrol Banana on: 11/03/2015 11:11 AM   Modules accepted: Orders

## 2015-11-06 ENCOUNTER — Ambulatory Visit (INDEPENDENT_AMBULATORY_CARE_PROVIDER_SITE_OTHER): Payer: Medicare Other | Admitting: Cardiology

## 2015-11-06 ENCOUNTER — Encounter: Payer: Self-pay | Admitting: Cardiology

## 2015-11-06 VITALS — BP 130/72 | HR 80 | Ht 62.0 in | Wt 191.8 lb

## 2015-11-06 DIAGNOSIS — E785 Hyperlipidemia, unspecified: Secondary | ICD-10-CM | POA: Diagnosis not present

## 2015-11-06 DIAGNOSIS — R079 Chest pain, unspecified: Secondary | ICD-10-CM | POA: Diagnosis not present

## 2015-11-06 DIAGNOSIS — J4520 Mild intermittent asthma, uncomplicated: Secondary | ICD-10-CM

## 2015-11-06 NOTE — Progress Notes (Signed)
Cardiology Office Note [name] PATIENT  Date:  11/06/2015   ID:  [name], DOB [birth year], MRN [id number]  PCP:  [name] Seashore, MD  Cardiologist:  [name] was seen Dr. Johnsie Cancel. In 2012  Chief Complaint  Patient presents with  . Chest Pain      History of Present Illness: [name] is a 69 y.o. female who presents for chest pain.  Referred by Dr. Lamonte Sakai.  + chest pain with exertion at times her Lt arm is involved.  Her SOB has increased with exertion.  Any walking and + SOB and increased fatigue.   The first episode of pain was 1 month ago woke her from sleep and was upper sternal pain and into lt ant chest . Lasted 10 min.  + mild nausea and diaphoresis no SOB.  Since then may occur twice a week with rest or exertion and with radiation into Lt arm.  She discussed with Dr. Lamonte Sakai and he referred her to [place]. No pain on exam.   Has hx of myoview in 2012- normal study without ischemia or infarction. . Stress Echo  2013 no echo evidence for ischemia poor functional capacity.  Last echo 08/2012 EF 55-60%, mild MR.   Carotid dopplers neg 2013.  At that time she was admitted for TIA.     She has Lt knee pain that restricts her walking as well as her SOB.   Past Medical History  Diagnosis Date  . Allergic rhinitis   . Asthma   . GERD (gastroesophageal reflux disease)   . Hyperlipidemia   . Vocal cord dysfunction   . Esophageal stricture   . Colonic polyp   . Osteoporosis   . DJD (degenerative joint disease) of knee     bilateral  . Hiatal hernia   . DDD (degenerative disc disease), lumbar 07/20/2011  . DJD (degenerative joint disease), lumbar 07/20/2011  . Diabetes mellitus, type 2 ([name])     does not take medication; pt denies  . Essential thrombocythemia ([place]) 12/24/2013    Past Surgical History  Procedure Laterality Date  . Carpal tunnel release      bilateral  . Total abdominal hysterectomy    . Foot surgery      bilateral  . Knee athroscopy  Dec.3, 2014    left knee    . Video bronchoscopy Bilateral 12/12/2014    Procedure: VIDEO BRONCHOSCOPY WITHOUT FLUORO;  Surgeon: Chesley Mires, MD;  Location: [facility]COPY;  Service: Cardiopulmonary;  Laterality: Bilateral;     Current Outpatient Prescriptions  Medication Sig Dispense Refill  . albuterol (PROVENTIL HFA) 108 (90 BASE) MCG/ACT inhaler Inhale 2 puffs into the lungs every 4 (four) hours as needed for wheezing or shortness of breath.     Marland Kitchen albuterol (PROVENTIL) (2.5 MG/3ML) 0.083% nebulizer solution Take 2.5 mg by nebulization every 4 (four) hours as needed for shortness of breath. For shortness of breath.    Marland Kitchen aspirin EC 81 MG tablet Take 81 mg by mouth daily.    . budesonide-formoterol (SYMBICORT) 160-4.5 MCG/ACT inhaler Inhale 2 puffs into the lungs 2 (two) times daily. 10.2 g 5  . cholecalciferol (VITAMIN D) 1000 UNITS tablet Take 1,000 Units by mouth daily.    . clonazePAM (KLONOPIN) 0.5 MG tablet TAKE 1 TABLET BY MOUTH AS NEEDED TWICE A DAY  0  . fluticasone (FLONASE) 50 MCG/ACT nasal spray Place 1 spray into the nose 2 (two) times daily.     . folic acid (FOLVITE) 1 MG tablet  Take 1 tablet (1 mg total) by mouth daily. 90 tablet 3  . hydroxyurea (HYDREA) 500 MG capsule Take 1 capsule (500 mg total) by mouth 3 (three) times daily. 90 capsule 6  . Magnesium 200 MG TABS Take 1 tablet by mouth 2 (two) times daily.     . meloxicam (MOBIC) 15 MG tablet Take 1 tablet by mouth daily.  2  . NON FORMULARY Take 1 capsule by mouth every morning. BLACK COHASH    . omeprazole (PRILOSEC) 20 MG capsule Take 20 mg by mouth daily.    . polyethylene glycol powder (MIRALAX) powder Take 1 Container by mouth as needed for mild constipation.     . pravastatin (PRAVACHOL) 40 MG tablet Take 40 mg by mouth daily.    . sertraline (ZOLOFT) 50 MG tablet Take 50 mg by mouth daily.     No current facility-administered medications for this visit.    Allergies:   Penicillins    Social History:  The patient  reports that she quit  smoking about 20 years ago. Her smoking use included Cigarettes. She started smoking about 40 years ago. She has a 20 pack-year smoking history. She has never used smokeless tobacco. She reports that she does not drink alcohol or use illicit drugs.   Family History:  The patient's family history includes Asthma in her brother and another family member; Cancer in her sister; Colon cancer (age of onset: 69) in her father; Heart attack in her mother. There is no history of Stomach cancer, Esophageal cancer, or Rectal cancer. her mother died suddenly with acute MI.    ROS:  General:no colds or fevers, no weight changes Skin:no rashes or ulcers HEENT:no blurred vision, no congestion CV:see HPI PUL:see HPI GI:no diarrhea constipation or melena, no indigestion GU:no hematuria, no dysuria MS:no joint pain, no claudication Neuro:no syncope, Occ lightheadedness Endo:no diabetes- though with steroids her glucose is elevated. , no thyroid disease  Wt Readings from Last 3 Encounters:  11/06/15 191 lb 12.8 oz (87 kg)  11/03/15 191 lb (86.637 kg)  09/10/15 190 lb (86.183 kg)     PHYSICAL EXAM: VS:  BP 130/72 mmHg  Pulse 80  Ht 5\' 2"  (1.575 m)  Wt 191 lb 12.8 oz (87 kg)  BMI 35.07 kg/m2 , BMI Body mass index is 35.07 kg/(m^2). General:Pleasant affect, NAD Skin:Warm and dry, brisk capillary refill HEENT:normocephalic, sclera clear, mucus membranes moist Neck:supple, no JVD, no bruits  Heart:S1S2 RRR without murmur, gallup, rub or click Lungs: without rales, rhonchi, + wheezes- these are normal for pt.  [id number], non tender, + BS, do not palpate liver spleen or masses Ext:no lower ext edema, 2+ pedal pulses, 2+ radial pulses Neuro:alert and oriented X 3, MAE, follows commands, + facial symmetry    EKG:  EKG is ordered today. The ekg ordered today demonstrates SR normal EKG,    Recent Labs: 08/06/2015: ALT 11; BUN 11; Creatinine, Ser 0.88; Potassium 4.4; Sodium 141 09/10/2015: HGB 11.8;  Platelets 292    Lipid Panel    Component Value Date/Time   CHOL 121 09/02/2012 0500   TRIG 53 09/02/2012 0500   HDL 48 09/02/2012 0500   CHOLHDL 2.5 09/02/2012 0500   VLDL 11 09/02/2012 0500   LDLCALC 62 09/02/2012 0500       Other studies Reviewed: Additional studies/ records that were reviewed today include: as above and previous notes.    ASSESSMENT AND PLAN:  1.  Chest pain and DOE risk factors with + FH,  obesity, borderline DM HTN ,Hyperlipidemia, remote tobacco.   with plan for lexiscan myuoview.  Pt cannot walk on treadmill.  If + would proceed with cath.  She will follow up with Dr. Johnsie Cancel for 1st available, unless + test then will see earlier.  Continue ASA.  Dr. Angelena Form saw the pt and agreed with the plan.   2. Asthma with wheezes today, followed by Dr. Lamonte Sakai.  3. Hyperlipidemia continue statin.       Current medicines are reviewed with the patient today.  The patient Has no concerns regarding medicines.  The following changes have been made:  See above Labs/ tests ordered today include:see above  Disposition:   FU:  see above  Signed, Isaiah Serge, NP  11/06/2015 8:35 AM    [facility] [place], [place], [place] [address] [place], [place] Phone: [phone]; Fax: [phone]

## 2015-11-06 NOTE — Patient Instructions (Signed)
Medication Instructions:  Your physician recommends that you continue on your current medications as directed. Please refer to the Current Medication list given to you today.   Labwork: None   Testing/Procedures: Your physician has requested that you have a lexiscan myoview. For further information please visit [email or web address]. Please follow instruction sheet, as given.     Follow-Up: Your physician recommends that you keep your scheduled  follow-up appointment with Dr. Johnsie Cancel on January 23rd.   Any Other Special Instructions Will Be Listed Below (If Applicable).  If chest pain persists please go to ER. If test is positive will get your appointment sooner.   If you need a refill on your cardiac medications before your next appointment, please call your pharmacy.

## 2015-11-12 ENCOUNTER — Telehealth (HOSPITAL_COMMUNITY): Payer: Self-pay | Admitting: *Deleted

## 2015-11-12 NOTE — Telephone Encounter (Signed)
Patient given detailed instructions per Myocardial Perfusion Study Information Sheet for the test on 11/17/15 at 745. Patient notified to arrive 15 minutes early and that it is imperative to arrive on time for appointment to keep from having the test rescheduled.  If you need to cancel or reschedule your appointment, please call the office within 24 hours of your appointment. Failure to do so may result in a cancellation of your appointment, and a $50 no show fee. Patient verbalized understanding.Hubbard Robinson, RN

## 2015-11-13 ENCOUNTER — Encounter: Payer: Self-pay | Admitting: Internal Medicine

## 2015-11-16 ENCOUNTER — Encounter: Payer: Self-pay | Admitting: Cardiovascular Disease

## 2015-11-17 ENCOUNTER — Ambulatory Visit (HOSPITAL_COMMUNITY): Payer: Medicare Other | Attending: Cardiology

## 2015-11-17 DIAGNOSIS — R5383 Other fatigue: Secondary | ICD-10-CM | POA: Diagnosis not present

## 2015-11-17 DIAGNOSIS — R0609 Other forms of dyspnea: Secondary | ICD-10-CM | POA: Diagnosis not present

## 2015-11-17 DIAGNOSIS — Z8249 Family history of ischemic heart disease and other diseases of the circulatory system: Secondary | ICD-10-CM | POA: Diagnosis not present

## 2015-11-17 DIAGNOSIS — R079 Chest pain, unspecified: Secondary | ICD-10-CM | POA: Diagnosis not present

## 2015-11-17 DIAGNOSIS — R0602 Shortness of breath: Secondary | ICD-10-CM | POA: Diagnosis not present

## 2015-11-17 LAB — MYOCARDIAL PERFUSION IMAGING
CHL CUP NUCLEAR SSS: 0
CHL CUP RESTING HR STRESS: 63 {beats}/min
CSEPPHR: 96 {beats}/min
LHR: 0.25
LVDIAVOL: 70 mL
LVSYSVOL: 17 mL
SDS: 0
SRS: 0
TID: 0.82

## 2015-11-17 MED ORDER — TECHNETIUM TC 99M SESTAMIBI GENERIC - CARDIOLITE
10.7000 | Freq: Once | INTRAVENOUS | Status: AC | PRN
  Administered 2015-11-17: 11 via INTRAVENOUS

## 2015-11-17 MED ORDER — TECHNETIUM TC 99M SESTAMIBI GENERIC - CARDIOLITE
32.8000 | Freq: Once | INTRAVENOUS | Status: AC | PRN
  Administered 2015-11-17: 32.8 via INTRAVENOUS

## 2015-11-17 MED ORDER — REGADENOSON 0.4 MG/5ML IV SOLN
0.4000 mg | Freq: Once | INTRAVENOUS | Status: AC
  Administered 2015-11-17: 0.4 mg via INTRAVENOUS

## 2015-11-18 ENCOUNTER — Telehealth: Payer: Self-pay | Admitting: *Deleted

## 2015-11-18 NOTE — Telephone Encounter (Signed)
Called pt, per Cecilie Kicks, NP, and advised her that her stress test results were normal and to keep her f/u appt with Dr. Johnsie Cancel.  She was advised that if she had increased symptoms prior to that appt, to call the office.  Pt verbalized understanding.

## 2015-11-18 NOTE — Telephone Encounter (Signed)
-----   Message from Isaiah Serge, NP sent at 11/18/2015  6:56 AM EST ----- Stress test was normal, please keep appt with Dr. Johnsie Cancel and if increased symptoms prior to that please call.

## 2015-11-23 ENCOUNTER — Encounter: Payer: Self-pay | Admitting: Physician Assistant

## 2015-11-23 ENCOUNTER — Other Ambulatory Visit (INDEPENDENT_AMBULATORY_CARE_PROVIDER_SITE_OTHER): Payer: Medicare Other

## 2015-11-23 ENCOUNTER — Ambulatory Visit (INDEPENDENT_AMBULATORY_CARE_PROVIDER_SITE_OTHER): Payer: Medicare Other | Admitting: Physician Assistant

## 2015-11-23 VITALS — BP 110/60 | HR 88 | Ht 62.0 in | Wt 192.0 lb

## 2015-11-23 DIAGNOSIS — D509 Iron deficiency anemia, unspecified: Secondary | ICD-10-CM

## 2015-11-23 DIAGNOSIS — Z86010 Personal history of colonic polyps: Secondary | ICD-10-CM

## 2015-11-23 DIAGNOSIS — Z800 Family history of malignant neoplasm of digestive organs: Secondary | ICD-10-CM

## 2015-11-23 LAB — CBC WITH DIFFERENTIAL/PLATELET
BASOS ABS: 0 10*3/uL (ref 0.0–0.1)
Basophils Relative: 0.5 % (ref 0.0–3.0)
EOS ABS: 0 10*3/uL (ref 0.0–0.7)
Eosinophils Relative: 0.3 % (ref 0.0–5.0)
HEMATOCRIT: 34.9 % — AB (ref 36.0–46.0)
Hemoglobin: 11 g/dL — ABNORMAL LOW (ref 12.0–15.0)
LYMPHS ABS: 1.3 10*3/uL (ref 0.7–4.0)
LYMPHS PCT: 25.1 % (ref 12.0–46.0)
MCHC: 31.5 g/dL (ref 30.0–36.0)
MCV: 88.7 fl (ref 78.0–100.0)
MONOS PCT: 10.6 % (ref 3.0–12.0)
Monocytes Absolute: 0.5 10*3/uL (ref 0.1–1.0)
NEUTROS ABS: 3.2 10*3/uL (ref 1.4–7.7)
Neutrophils Relative %: 63.5 % (ref 43.0–77.0)
PLATELETS: 225 10*3/uL (ref 150.0–400.0)
RBC: 3.94 Mil/uL (ref 3.87–5.11)
RDW: 28.5 % — ABNORMAL HIGH (ref 11.5–15.5)
WBC: 5.1 10*3/uL (ref 4.0–10.5)

## 2015-11-23 MED ORDER — NA SULFATE-K SULFATE-MG SULF 17.5-3.13-1.6 GM/177ML PO SOLN: 1.0000 | Freq: Once | ORAL | Status: AC

## 2015-11-23 NOTE — Progress Notes (Signed)
Patient ID: [name], female   DOB: [birth year], 69 y.o.   MRN: [id number]   Subjective:    Patient ID: [name], female    DOB: 11/18/1946, 69 y.o.   MRN: [id number]  HPI  [name]  Is a pleasant 69 year old African-American female known to Dr. Henrene [name] who is referred today by her PCP Dr. Ashby [name]  For evaluation of new microcytic anemia. Patient has history of sickle cell trait and thrombocytosis and is followed by Dr. Marin [name]. She has been managed with baby aspirin, folic acid and Hydrea. Exline She does have family history of colon cancer, her last colonoscopy was in 2013 she had to tubular adenomas removed.  Most recent labs show hemoglobin of 11.8 hematocrit of 35.4 MCV of 76 and platelets of 292 these were done in October 2016. She did have iron studies done in September which were normal and has repeat studies pending.  patient complains of fatigue over the past 6 months. She's not had any changes in her bowels no complaints of abdominal pain. Or hematochezia. Her appetite is been decreased over the past year and she relates this to the death of her husband a little over a year ago. Her weight has been stable.  She does have history of chronic GERD, is on Prilosec which controls her symptoms and has no complaints of dysphagia. She does take Meloxicam chronically.  Review of Systems Pertinent positive and negative review of systems were noted in the above HPI section.  All other review of systems was otherwise negative.  Outpatient Encounter Prescriptions as of 11/23/2015  Medication Sig  . albuterol (PROVENTIL HFA) 108 (90 BASE) MCG/ACT inhaler Inhale 2 puffs into the lungs every 4 (four) hours as needed for wheezing or shortness of breath.   Marland Kitchen albuterol (PROVENTIL) (2.5 MG/3ML) 0.083% nebulizer solution Take 2.5 mg by nebulization every 4 (four) hours as needed for shortness of breath. For shortness of breath.  Marland Kitchen aspirin EC 81 MG tablet Take 81 mg by mouth daily.  .  budesonide-formoterol (SYMBICORT) 160-4.5 MCG/ACT inhaler Inhale 2 puffs into the lungs 2 (two) times daily.  . cholecalciferol (VITAMIN D) 1000 UNITS tablet Take 1,000 Units by mouth daily.  . clonazePAM (KLONOPIN) 0.5 MG tablet TAKE 1 TABLET BY MOUTH AS NEEDED TWICE A DAY  . fluticasone (FLONASE) 50 MCG/ACT nasal spray Place 1 spray into the nose 2 (two) times daily.   . folic acid (FOLVITE) 1 MG tablet Take 1 tablet (1 mg total) by mouth daily.  . hydroxyurea (HYDREA) 500 MG capsule Take 1 capsule (500 mg total) by mouth 3 (three) times daily.  . Magnesium 200 MG TABS Take 1 tablet by mouth 2 (two) times daily.   . meloxicam (MOBIC) 15 MG tablet Take 1 tablet by mouth daily.  . NON FORMULARY Take 1 capsule by mouth every morning. BLACK COHASH  . omeprazole (PRILOSEC) 20 MG capsule Take 20 mg by mouth daily.  . polyethylene glycol powder (MIRALAX) powder Take 1 Container by mouth as needed for mild constipation.   . pravastatin (PRAVACHOL) 40 MG tablet Take 40 mg by mouth daily.  . sertraline (ZOLOFT) 50 MG tablet Take 50 mg by mouth daily.  . Na Sulfate-K Sulfate-Mg Sulf SOLN Take 1 kit by mouth once.   No facility-administered encounter medications on file as of 11/23/2015.   Allergies  Allergen Reactions  . Penicillins Itching and Rash   Patient Active Problem List   Diagnosis Date Noted  . Shingles 12/19/2014  .  Lung collapse   . Acute on chronic respiratory failure ([place])   . Atelectasis   . Essential thrombocythemia ([place]) 12/24/2013  . TIA (transient ischemic attack) 09/01/2012  . Chest pain 09/01/2012  . Arm numbness left 06/03/2012  . Migraine 05/31/2012  . DDD (degenerative disc disease), lumbar 07/20/2011  . DJD (degenerative joint disease), lumbar 07/20/2011  . CVA (cerebral infarction) 07/20/2011  . Acute lumbar radiculopathy 07/12/2011  . Preventative health care 07/10/2011  . TRIGGER FINGER, RIGHT MIDDLE 01/11/2011  . Other dysphagia 01/11/2011  . MENOPAUSAL  DISORDER 07/05/2010  . OSTEOARTHRITIS, KNEES, BILATERAL 07/05/2010  . NEVUS 05/20/2010  . VAGINITIS 03/23/2010  . SUPERFICIAL THROMBOPHLEBITIS 03/20/2009  . FATIGUE 03/20/2009  . DYSPHAGIA UNSPECIFIED 02/16/2009  . SNORING 11/05/2008  . HERPES ZOSTER 06/30/2008  . Diabetes ([place]) 06/30/2008  . Hyperlipidemia 06/30/2008  . ESOPHAGEAL STRICTURE 06/30/2008  . FOOT PAIN, RIGHT 06/30/2008  . OSTEOPOROSIS 06/30/2008  . COLONIC POLYPS, HX OF 06/30/2008  . PANIC ATTACK 09/05/2007  . Allergic rhinitis 09/05/2007  . Vocal cord dysfunction 09/05/2007  . Intrinsic asthma 09/05/2007  . Esophageal reflux 09/05/2007   Social History   Social History  . Marital Status: Married    Spouse Name: N/A  . Number of Children: N/A  . Years of Education: N/A   Occupational History  . retired    Social History Main Topics  . Smoking status: Former Smoker -- 1.00 packs/day for 20 years    Types: Cigarettes    Start date: 01/31/1975    Quit date: 08/09/1995  . Smokeless tobacco: Never Used     Comment: quit 18 years ago.  . Alcohol Use: No  . Drug Use: No  . Sexual Activity: Not Currently   Other Topics Concern  . Not on file   Social History Narrative    Ms. Lankford's family history includes Asthma in her brother; Cancer in her sister; Colon cancer (age of onset: 39) in her father; Heart attack in her mother. There is no history of Stomach cancer, Esophageal cancer, or Rectal cancer.      Objective:    Filed Vitals:   11/23/15 0842  BP: 110/60  Pulse: 88    Physical Exam    Well-developed older African-American female in no acute distress, pleasant blood pressure 110/60 pulse 88 height 5 foot 2 weight 192. HEENT ;nontraumatic normocephalic EOMI PERRLA sclera anicteric, Cardiovascular; regular rate and rhythm with S1-S2 no murmur or gallop, Pulmonary clear bilaterally , Abdomen ;soft , nontender, nondistended ,bowel sounds are active there is no palpable mass or hepatosplenomegaly  bowel sounds are active , Rectal ;exam not done, Ext; no clubbing cyanosis or edema skin warm and dry, Neuropsych; mood and affect appropriate      Assessment & Plan:   #1 69 yo female with new microcytic anemia- Fe studies pending. R/O occult upper vs lower GI blood loss.R/O possible medication induced(Hydrea) #2 hx thrombocytosis #3 SS trait #4 Family hx of Colon CA #5 personal hx of adenomatous polyps-colon 2013  Plan; Will check iron studies today, repeat hgb Check hemosure  Schedule for Colonoscopy and EGD with Dr [name]. Procedures discussed in detail with pt and she is agreeable to proceed. If Gi wokup negative will refer back to Dr.Ennever    Aubriana Ravelo Genia Harold PA-C 11/23/2015   Cc: Merrilee Seashore, MD

## 2015-11-23 NOTE — Patient Instructions (Addendum)
Please go to the basement level to have your labs drawn.  You have been scheduled for an endoscopy and colonoscopy. Please follow the written instructions given to you at your visit today. Please pick up your prep supplies at the pharmacy within the next 1-3 days. [facility] [address]. If you use inhalers (even only as needed), please bring them with you on the day of your procedure. Your physician has requested that you go to www.startemmi.com and enter the access code given to you at your visit today. This web site gives a general overview about your procedure. However, you should still follow specific instructions given to you by our office regarding your preparation for the procedure.

## 2015-11-23 NOTE — Progress Notes (Signed)
Agree with initial assessment and plans 

## 2015-11-24 ENCOUNTER — Other Ambulatory Visit: Payer: Self-pay

## 2015-11-24 DIAGNOSIS — D649 Anemia, unspecified: Secondary | ICD-10-CM

## 2015-12-02 ENCOUNTER — Other Ambulatory Visit (INDEPENDENT_AMBULATORY_CARE_PROVIDER_SITE_OTHER): Payer: Medicare Other

## 2015-12-02 DIAGNOSIS — D649 Anemia, unspecified: Secondary | ICD-10-CM | POA: Diagnosis not present

## 2015-12-02 LAB — HEMOCCULT SLIDES (X 3 CARDS)
FECAL OCCULT BLD: NEGATIVE
OCCULT 1: NEGATIVE
OCCULT 2: NEGATIVE
OCCULT 3: NEGATIVE
OCCULT 4: NEGATIVE
OCCULT 5: NEGATIVE

## 2015-12-06 DIAGNOSIS — I639 Cerebral infarction, unspecified: Secondary | ICD-10-CM

## 2015-12-06 HISTORY — PX: POLYPECTOMY: SHX149

## 2015-12-06 HISTORY — DX: Cerebral infarction, unspecified: I63.9

## 2015-12-06 HISTORY — PX: COLONOSCOPY: SHX174

## 2015-12-09 MED FILL — HYDROXYUREA 500 MG CAPSULE: 500 | 30 days supply | Qty: 90 | Fill #1

## 2015-12-10 ENCOUNTER — Encounter: Payer: Self-pay | Admitting: Family

## 2015-12-10 ENCOUNTER — Other Ambulatory Visit (HOSPITAL_BASED_OUTPATIENT_CLINIC_OR_DEPARTMENT_OTHER): Payer: Medicare Other

## 2015-12-10 ENCOUNTER — Ambulatory Visit (HOSPITAL_BASED_OUTPATIENT_CLINIC_OR_DEPARTMENT_OTHER): Payer: Medicare Other | Admitting: Family

## 2015-12-10 VITALS — BP 134/70 | HR 89 | Temp 97.9°F | Resp 18 | Ht 62.0 in | Wt 188.0 lb

## 2015-12-10 DIAGNOSIS — R252 Cramp and spasm: Secondary | ICD-10-CM

## 2015-12-10 DIAGNOSIS — D473 Essential (hemorrhagic) thrombocythemia: Secondary | ICD-10-CM

## 2015-12-10 LAB — CBC WITH DIFFERENTIAL (CANCER CENTER ONLY)
BASO#: 0 10*3/uL (ref 0.0–0.2)
BASO%: 0.2 % (ref 0.0–2.0)
EOS ABS: 0 10*3/uL (ref 0.0–0.5)
EOS%: 0.8 % (ref 0.0–7.0)
HEMATOCRIT: 32.3 % — AB (ref 34.8–46.6)
HGB: 10.5 g/dL — ABNORMAL LOW (ref 11.6–15.9)
LYMPH#: 1.1 10*3/uL (ref 0.9–3.3)
LYMPH%: 21.6 % (ref 14.0–48.0)
MCH: 28.6 pg (ref 26.0–34.0)
MCHC: 32.5 g/dL (ref 32.0–36.0)
MCV: 88 fL (ref 81–101)
MONO#: 0.4 10*3/uL (ref 0.1–0.9)
MONO%: 7.4 % (ref 0.0–13.0)
NEUT%: 70 % (ref 39.6–80.0)
NEUTROS ABS: 3.7 10*3/uL (ref 1.5–6.5)
Platelets: 328 10*3/uL (ref 145–400)
RBC: 3.67 10*6/uL — ABNORMAL LOW (ref 3.70–5.32)
RDW: 22.2 % — ABNORMAL HIGH (ref 11.1–15.7)
WBC: 5.2 10*3/uL (ref 3.9–10.0)

## 2015-12-10 LAB — IRON AND TIBC
%SAT: 36 % (ref 21–57)
IRON: 86 ug/dL (ref 41–142)
TIBC: 238 ug/dL (ref 236–444)
UIBC: 152 ug/dL (ref 120–384)

## 2015-12-10 LAB — COMPREHENSIVE METABOLIC PANEL
ALBUMIN: 3.8 g/dL (ref 3.5–5.0)
ALK PHOS: 66 U/L (ref 40–150)
ALT: 16 U/L (ref 0–55)
ANION GAP: 9 meq/L (ref 3–11)
AST: 20 U/L (ref 5–34)
BILIRUBIN TOTAL: 0.78 mg/dL (ref 0.20–1.20)
BUN: 12.2 mg/dL (ref 7.0–26.0)
CO2: 23 mEq/L (ref 22–29)
Calcium: 9.5 mg/dL (ref 8.4–10.4)
Chloride: 107 mEq/L (ref 98–109)
Creatinine: 0.8 mg/dL (ref 0.6–1.1)
EGFR: 81 mL/min/{1.73_m2} — AB (ref >90)
GLUCOSE: 86 mg/dL (ref 70–140)
Potassium: 4 mEq/L (ref 3.5–5.1)
Sodium: 140 mEq/L (ref 136–145)
TOTAL PROTEIN: 7 g/dL (ref 6.4–8.3)

## 2015-12-10 LAB — FERRITIN: Ferritin: 211 ng/ml (ref 9–269)

## 2015-12-10 LAB — LACTATE DEHYDROGENASE: LDH: 284 U/L — ABNORMAL HIGH (ref 125–245)

## 2015-12-10 LAB — CHCC SATELLITE - SMEAR

## 2015-12-10 LAB — MAGNESIUM: MAGNESIUM: 2.3 mg/dL (ref 1.5–2.5)

## 2015-12-10 LAB — TECHNOLOGIST REVIEW CHCC SATELLITE

## 2015-12-10 NOTE — Progress Notes (Signed)
Hematology and Oncology Follow Up Visit  [name] [id number] 1946-03-21 70 y.o. 12/10/2015   Principle Diagnosis:  Essential thrombocythemia-JAK2 POSITIVE Sickle cell trait  Current Therapy:   Hydrea 500 mg by mouth TID Folic acid 1 mg by mouth daily Aspirin 81 mg daily    Interim History:  [name] is here today for a follow-up. She is having some fatigue and her Hgb is a little down at 10.5. She saw her PCP last week and is now scheduled for an endoscopy and colonoscopy with Dr. Henrene Pastor at the end of the month.  Her hemoccult tests were negative. She denies having any episodes of bleeding or bruising.  Her platelet count is 328.  No fever, chills, n/v, rash, dizziness, chest pain, palpitations, abdominal pain, constipation, diarrhea, blood in urine or stool.  She has SOB with exertion due to asthma and carries an inhaler with her.  She is having cramping in her legs and feet. I will add a magnesium level to her labs today.  She is eating healthy and staying well hydrated with water. Her weight is stable.   Medications:    Medication List       This list is accurate as of: 12/10/15  8:59 AM.  Always use your most recent med list.               aspirin EC 81 MG tablet  Take 81 mg by mouth daily.     budesonide-formoterol 160-4.5 MCG/ACT inhaler  Commonly known as:  SYMBICORT  Inhale 2 puffs into the lungs 2 (two) times daily.     cholecalciferol 1000 units tablet  Commonly known as:  VITAMIN D  Take 1,000 Units by mouth daily.     clonazePAM 0.5 MG tablet  Commonly known as:  KLONOPIN  TAKE 1 TABLET BY MOUTH AS NEEDED TWICE A DAY     fluticasone 50 MCG/ACT nasal spray  Commonly known as:  FLONASE  Place 1 spray into the nose 2 (two) times daily.     folic acid 1 MG tablet  Commonly known as:  FOLVITE  Take 1 tablet (1 mg total) by mouth daily.     hydroxyurea 500 MG capsule  Commonly known as:  HYDREA  Take 1 capsule (500 mg total) by mouth 3 (three)  times daily.     Magnesium 200 MG Tabs  Take 1 tablet by mouth 2 (two) times daily.     MIRALAX powder  Generic drug:  polyethylene glycol powder  Take 1 Container by mouth as needed for mild constipation.     Na Sulfate-K Sulfate-Mg Sulf Soln  Take 1 kit by mouth once.     NON FORMULARY  Take 1 capsule by mouth every morning. BLACK COHASH     omeprazole 20 MG capsule  Commonly known as:  PRILOSEC  Take 20 mg by mouth daily.     pravastatin 40 MG tablet  Commonly known as:  PRAVACHOL  Take 40 mg by mouth daily.     PROVENTIL HFA 108 (90 Base) MCG/ACT inhaler  Generic drug:  albuterol  Inhale 2 puffs into the lungs every 4 (four) hours as needed for wheezing or shortness of breath.     albuterol (2.5 MG/3ML) 0.083% nebulizer solution  Commonly known as:  PROVENTIL  Take 2.5 mg by nebulization every 4 (four) hours as needed for shortness of breath. For shortness of breath.     sertraline 50 MG tablet  Commonly known as:  ZOLOFT  Take 50 mg by mouth daily.        Allergies:  Allergies  Allergen Reactions  . Penicillins Itching and Rash    Past Medical History, Surgical history, Social history, and Family History were reviewed and updated.  Review of Systems: All other 10 point review of systems is negative.   Physical Exam:  height is _0  (1.575 m) and weight is 188 lb (85.276 kg). Her oral temperature is 97.9 F (36.6 C). Her blood pressure is 134/70 and her pulse is 89. Her respiration is 18.   Wt Readings from Last 3 Encounters:  12/10/15 188 lb (85.276 kg)  11/23/15 192 lb (87.091 kg)  11/17/15 191 lb (86.637 kg)    Ocular: Sclerae unicteric, pupils equal, round and reactive to light Ear-nose-throat: Oropharynx clear, dentition fair Lymphatic: No cervical supraclavicular or axillary adenopathy Lungs no rales or rhonchi, good excursion bilaterally Heart regular rate and rhythm, no murmur appreciated Abd soft, nontender, positive bowel sounds, no liver  or spleen tip palpated on exam MSK no focal spinal tenderness, no joint edema Neuro: non-focal, well-oriented, appropriate affect Breasts: Deferred  Lab Results  Component Value Date   WBC 5.2 12/10/2015   HGB 10.5* 12/10/2015   HCT 32.3* 12/10/2015   MCV 88 12/10/2015   PLT 328 12/10/2015   Lab Results  Component Value Date   FERRITIN 81 08/06/2015   IRON 96 08/06/2015   TIBC 238 08/06/2015   UIBC 141 08/06/2015   IRONPCTSAT 41 08/06/2015   Lab Results  Component Value Date   RETICCTPCT 1.4 08/06/2015   RBC 3.67* 12/10/2015   RETICCTABS 73.1 08/06/2015   No results found for: KPAFRELGTCHN, LAMBDASER, KAPLAMBRATIO No results found for: IGGSERUM, IGA, IGMSERUM No results found for: Kathrynn Ducking, MSPIKE, SPEI   Chemistry      Component Value Date/Time   NA 141 08/06/2015 0800   NA 143 03/31/2015 0850   NA 144 07/21/2014 0940   K 4.4 08/06/2015 0800   K 4.1 03/31/2015 0850   K 3.8 07/21/2014 0940   CL 107 08/06/2015 0800   CL 104 07/21/2014 0940   CO2 25 08/06/2015 0800   CO2 22 03/31/2015 0850   CO2 28 07/21/2014 0940   BUN 11 08/06/2015 0800   BUN 12.4 03/31/2015 0850   BUN 15 07/21/2014 0940   CREATININE 0.88 08/06/2015 0800   CREATININE 0.9 03/31/2015 0850   CREATININE 0.7 07/21/2014 0940      Component Value Date/Time   CALCIUM 9.2 08/06/2015 0800   CALCIUM 9.2 03/31/2015 0850   CALCIUM 9.1 07/21/2014 0940   ALKPHOS 64 08/06/2015 0800   ALKPHOS 68 03/31/2015 0850   ALKPHOS 63 07/21/2014 0940   AST 13 08/06/2015 0800   AST 17 03/31/2015 0850   AST 15 07/21/2014 0940   ALT 11 08/06/2015 0800   ALT 15 03/31/2015 0850   ALT 16 07/21/2014 0940   BILITOT 0.5 08/06/2015 0800   BILITOT 0.34 03/31/2015 0850   BILITOT 0.60 07/21/2014 0940     Impression and Plan: [name] is a 70 year old female with essential thrombocythemia. She is mildly anemic at this time and having some fatigue. She is scheduled for an  endoscopy and colonoscopy at the end of the month. She has had no episodes of bleeding.  Her platelet count today is 328. She will continue on her same dose of hydrea.  She will also continue her daily aspirin and folic acid.  We will see  what her iron studies and magnesium level shows.  We will plan to see her back in 2 months for labs and follow-up.  She knows to call here with any questions or concerns. We can certainly see her sooner if need be.   Eliezer Bottom, NP 1/5/20178:59 AM

## 2015-12-28 ENCOUNTER — Ambulatory Visit: Payer: Medicare Other | Admitting: Cardiovascular Disease

## 2015-12-30 ENCOUNTER — Ambulatory Visit (AMBULATORY_SURGERY_CENTER): Payer: Medicare Other | Admitting: Internal Medicine

## 2015-12-30 ENCOUNTER — Encounter: Payer: Self-pay | Admitting: Internal Medicine

## 2015-12-30 VITALS — BP 131/66 | HR 80 | Temp 98.2°F | Resp 18 | Ht 62.0 in | Wt 192.0 lb

## 2015-12-30 DIAGNOSIS — K222 Esophageal obstruction: Secondary | ICD-10-CM

## 2015-12-30 DIAGNOSIS — D509 Iron deficiency anemia, unspecified: Secondary | ICD-10-CM | POA: Diagnosis not present

## 2015-12-30 DIAGNOSIS — D125 Benign neoplasm of sigmoid colon: Secondary | ICD-10-CM | POA: Diagnosis not present

## 2015-12-30 DIAGNOSIS — D122 Benign neoplasm of ascending colon: Secondary | ICD-10-CM | POA: Diagnosis not present

## 2015-12-30 DIAGNOSIS — K219 Gastro-esophageal reflux disease without esophagitis: Secondary | ICD-10-CM

## 2015-12-30 DIAGNOSIS — Z86010 Personal history of colonic polyps: Secondary | ICD-10-CM | POA: Diagnosis not present

## 2015-12-30 MED ORDER — SODIUM CHLORIDE 0.9 % IV SOLN: 500.0000 mL | INTRAVENOUS | Status: DC

## 2015-12-30 NOTE — Op Note (Signed)
[place]  [address] [facility] [address]. [place], [address]   COLONOSCOPY PROCEDURE REPORT  PATIENT: [name], [name]  MR#: [id number] BIRTHDATE: Mar 31, 1946 , 31  yrs. old GENDER: female ENDOSCOPIST: Eustace Quail, MD REFERRED [id number] Ashby Dawes, M.D. PROCEDURE DATE:  12/30/2015 PROCEDURE:   Colonoscopy, diagnostic and Colonoscopy with snare polypectomy X2 First Screening Colonoscopy - Avg.  risk and is 50 yrs.  old or older - No.  Prior Negative Screening - Now for repeat screening. N/A  History of Adenoma - Now for follow-up colonoscopy & has been > or = to 3 yrs.  Yes hx of adenoma.  Has been 3 or more years since last colonoscopy.  Polyps removed today? Yes ASA CLASS:   Class II INDICATIONS:Unexplained ??? iron deficiency anemia. . Iron studies and ferritin levels normal. Hemoccult studies negative. Transient mild drop in hemoglobin from baseline with transient low MCV. Has not been on iron. GI review of systems negative. Patient has a family history of colon cancer in her father (63s). Street of adenomatous colon polyps is undergone multiple colonoscopies 1999, 2003, 2005, 2008, 2013 (tubular adenomas). MEDICATIONS: Monitored anesthesia care and Propofol 250 mg IV  DESCRIPTION OF PROCEDURE:   After the risks benefits and alternatives of the procedure were thoroughly explained, informed consent was obtained.  The digital rectal exam revealed no abnormalities of the rectum.   The LB [id number]  endoscope was introduced through the anus and advanced to the cecum, which was identified by both the appendix and ileocecal valve. No adverse events experienced.   The quality of the prep was excellent. (Suprep was used)  The instrument was then slowly withdrawn as the colon was fully examined. Estimated blood loss is zero unless otherwise noted in this procedure report.  COLON FINDINGS: Two polyps measuring 2 mm in size were found in the ascending colon and sigmoid  colon.  A polypectomy was performed with a cold snare.  The resection was complete, the polyp tissue was completely retrieved and sent to histology.   The examination was otherwise normal.  Retroflexed views revealed no abnormalities. The time to cecum = 3.1 Withdrawal time = 10.9   The scope was withdrawn and the procedure completed. COMPLICATIONS: There were no immediate complications.  ENDOSCOPIC IMPRESSION: 1.   Two polyps were found in the ascending colon and sigmoid colon; polypectomy was performed with a cold snare 2.   The examination was otherwise normal  RECOMMENDATIONS: 1.  Follow up colonoscopy in 5 years (personal and family history) 2.  Upper endoscopy today (please see results with final impressions and recommendations)  eSigned:  Eustace Quail, MD 12/30/2015 3:21 PM   cc: The Patient, Merrilee Seashore, MD, and Burney Gauze, MD

## 2015-12-30 NOTE — Progress Notes (Signed)
Called to room to assist during endoscopic procedure.  Patient ID and intended procedure confirmed with present staff. Received instructions for my participation in the procedure from the performing physician.  

## 2015-12-30 NOTE — Patient Instructions (Signed)

## 2015-12-30 NOTE — Progress Notes (Signed)
Report to PACU, RN, vss, BBS= Clear.  

## 2015-12-30 NOTE — Op Note (Signed)
[place]  [address]. [place], [address]   ENDOSCOPY PROCEDURE REPORT  PATIENT: [name], [name]  MR#: [id number] BIRTHDATE: [birth year] , 69  yrs. old GENDER: female ENDOSCOPIST: Eustace Quail, MD REFERRED BY:  Merrilee Seashore, M.D. PROCEDURE DATE:  12/30/2015 PROCEDURE:  EGD, diagnostic ASA CLASS:     Class II INDICATIONS:  anemia.. Question iron deficiency. However, normal iron studies, normal ferritin, occult negative stools. He does have a history of gastric stricture, GERD, and hiatal hernia. Also with myeloproliferative disorder. Negative GI review of systems MEDICATIONS: Monitored anesthesia care and Propofol 50 mg IV TOPICAL ANESTHETIC: none  DESCRIPTION OF PROCEDURE: After the risks benefits and alternatives of the procedure were thoroughly explained, informed consent was obtained.  The LB [id number] endoscope was introduced through the mouth and advanced to the second portion of the duodenum , Without limitations.  The instrument was slowly withdrawn as the mucosa was fully examined.  EXAM :Esophagus revealed a distal esophageal ring at the gastroesophageal junction without inflammation.  Moderate-sized hiatal hernia without erosions.  Gastric mucosa was normal.  The duodenum was normal.  Retroflexed views revealed a hiatal hernia. The scope was then withdrawn from the patient and the procedure completed.  COMPLICATIONS: There were no immediate complications.  ENDOSCOPIC IMPRESSION: 1. GERD with incidental esophageal stricture and hiatal hernia without erosions 2. Normocytic anemia. Not felt to have either iron deficiency or GI blood loss. Suspect related to known myeloproliferative disorder  RECOMMENDATIONS: 1. Continue current medications for known acid reflux 2. Return care of your primary provider. You may need see her hematologist regarding anemia.  REPEAT EXAM:  eSigned:  Eustace Quail, MD 12/30/2015 3:26  PM    CC:

## 2015-12-31 ENCOUNTER — Telehealth: Payer: Self-pay | Admitting: *Deleted

## 2015-12-31 NOTE — Telephone Encounter (Signed)
  Follow up Call-  Call back number 12/30/2015  Post procedure Call Back phone  # [phone]  Permission to leave phone message Yes     Patient questions:  Do you have a fever, pain , or abdominal swelling? No. Pain Score  0 *  Have you tolerated food without any problems? Yes.    Have you been able to return to your normal activities? Yes.    Do you have any questions about your discharge instructions: Diet   No. Medications  No. Follow up visit  No.  Do you have questions or concerns about your Care? No.  Actions: * If pain score is 4 or above: No action needed, pain <4.

## 2016-01-05 ENCOUNTER — Encounter: Payer: Self-pay | Admitting: Internal Medicine

## 2016-01-14 MED FILL — HYDROXYUREA 500 MG CAPSULE: 500 | 30 days supply | Qty: 90 | Fill #2

## 2016-01-30 IMAGING — DX DG CHEST 2V
2 series · 2 of 2 positions shown · non-contrast
Comparison: 07/31/2013

CLINICAL DATA: Shortness of breath, cough and wheezing for 2 days,
history of asthma

EXAM:
CHEST - 2 VIEW

[chest pa]
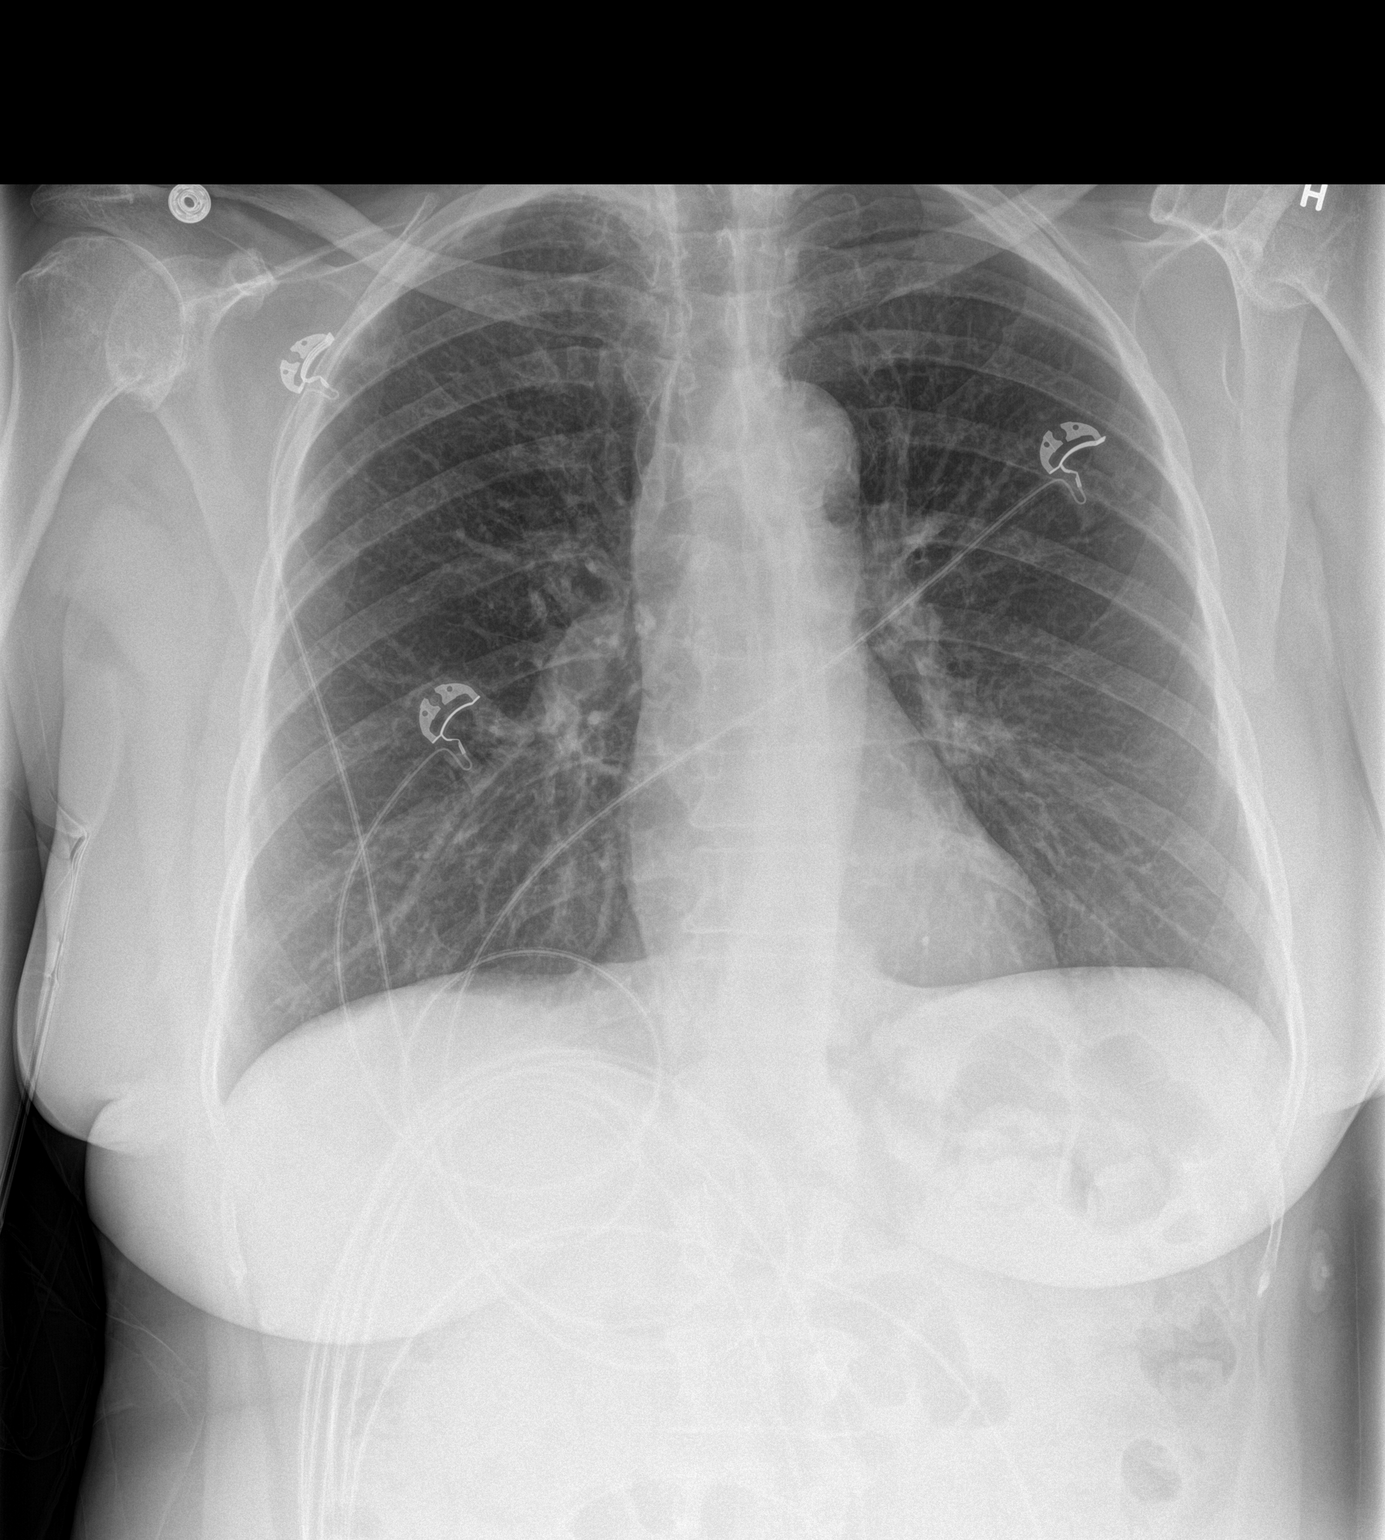

[chest lat]
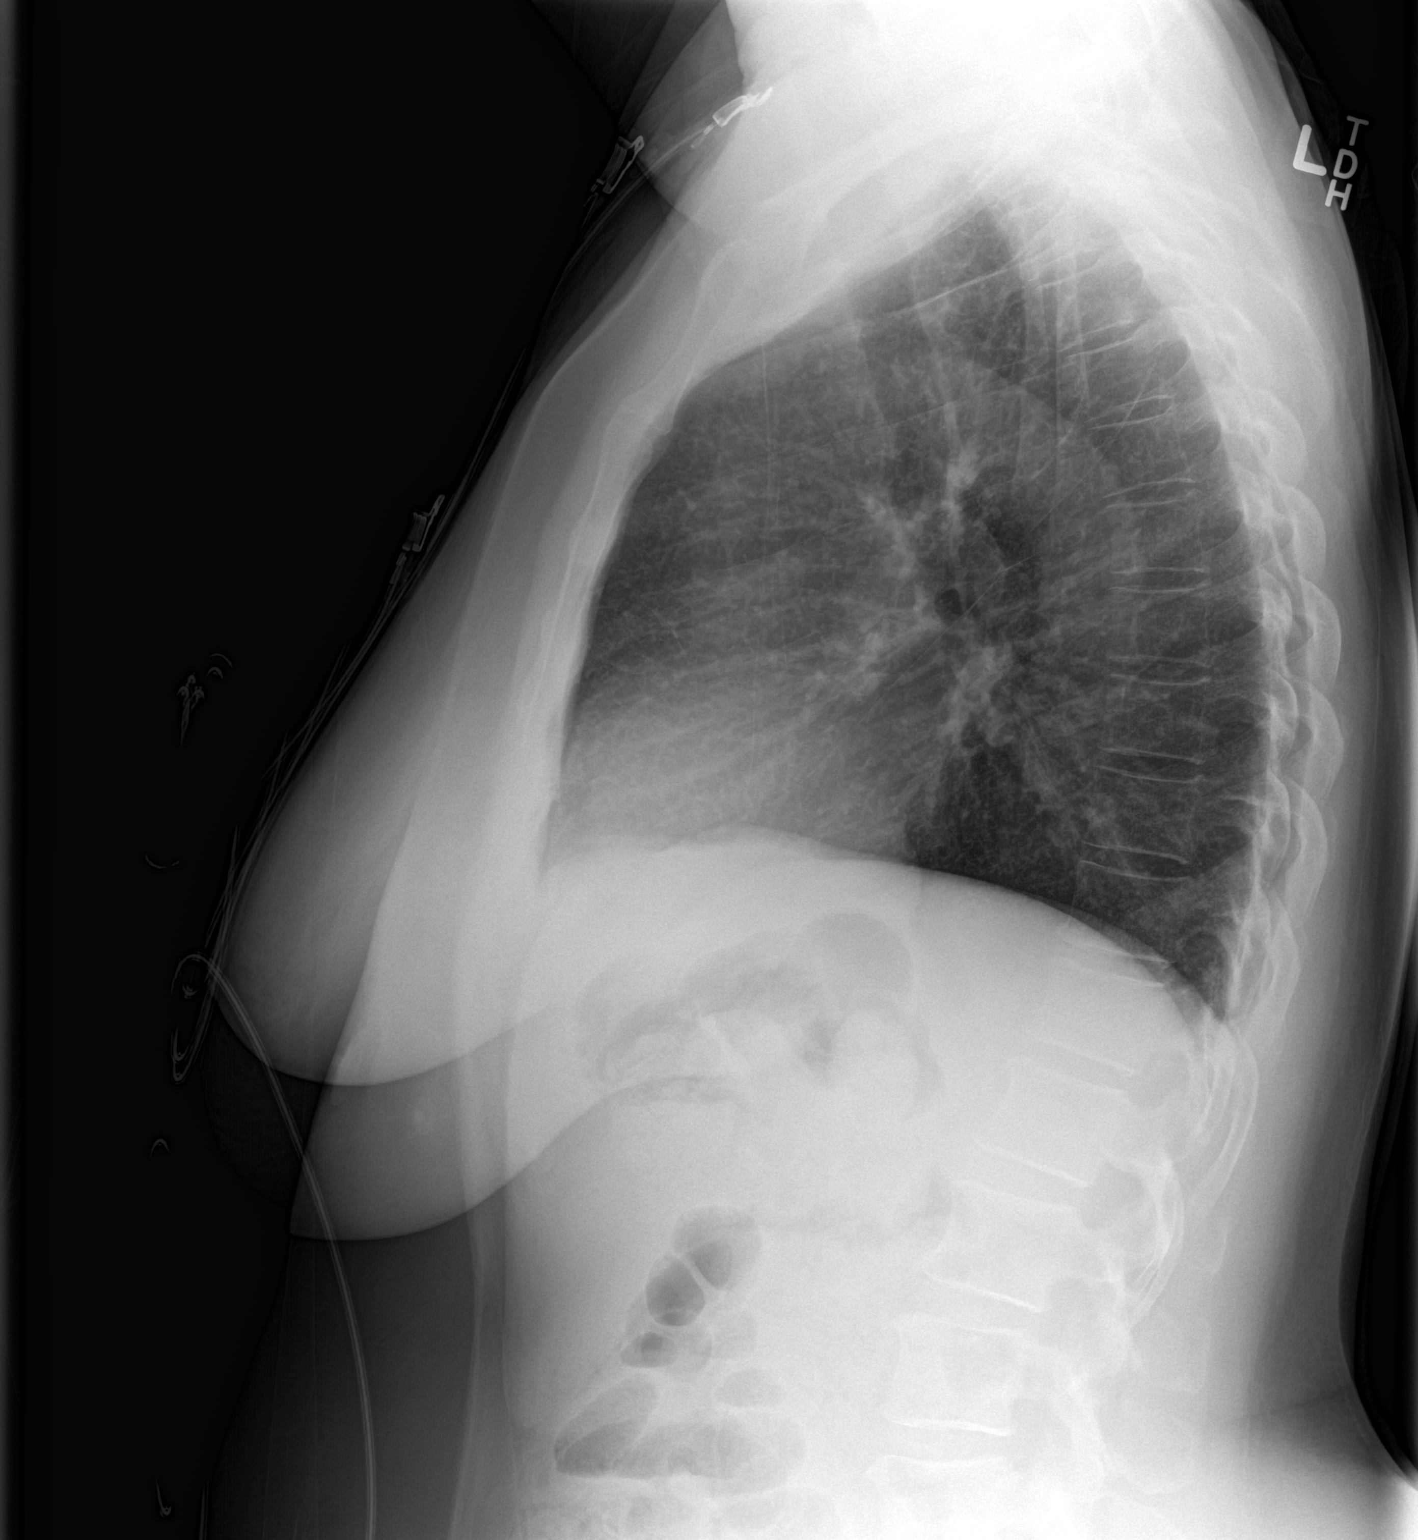

[2 of 2 positions shown; findings below may reference images not displayed]

FINDINGS: Lungs are clear. Heart size and mediastinal contours are within
normal limits.
No effusion.  Atheromatous aortic arch.
Visualized skeletal structures are unremarkable.
IMPRESSION: No acute cardiopulmonary disease.

## 2016-02-06 ENCOUNTER — Other Ambulatory Visit: Payer: Self-pay | Admitting: Emergency Medicine

## 2016-02-12 ENCOUNTER — Other Ambulatory Visit: Payer: Medicare Other

## 2016-02-12 ENCOUNTER — Ambulatory Visit: Payer: Medicare Other | Admitting: Family

## 2016-02-15 ENCOUNTER — Other Ambulatory Visit: Payer: Self-pay | Admitting: *Deleted

## 2016-02-15 MED ORDER — OMEPRAZOLE 20 MG PO CPDR: DELAYED_RELEASE_CAPSULE | ORAL | Status: DC

## 2016-02-19 ENCOUNTER — Ambulatory Visit: Payer: Medicare Other | Admitting: Family

## 2016-02-19 ENCOUNTER — Other Ambulatory Visit: Payer: Medicare Other

## 2016-02-25 MED FILL — HYDROXYUREA 500 MG CAPSULE: 500 | 30 days supply | Qty: 90 | Fill #3

## 2016-02-26 ENCOUNTER — Ambulatory Visit (HOSPITAL_BASED_OUTPATIENT_CLINIC_OR_DEPARTMENT_OTHER): Payer: Medicare Other | Admitting: Family

## 2016-02-26 ENCOUNTER — Encounter: Payer: Self-pay | Admitting: Family

## 2016-02-26 ENCOUNTER — Other Ambulatory Visit (HOSPITAL_BASED_OUTPATIENT_CLINIC_OR_DEPARTMENT_OTHER): Payer: Medicare Other

## 2016-02-26 VITALS — BP 114/64 | HR 89 | Temp 98.0°F | Resp 14 | Ht 62.0 in | Wt 185.0 lb

## 2016-02-26 DIAGNOSIS — D573 Sickle-cell trait: Secondary | ICD-10-CM | POA: Diagnosis not present

## 2016-02-26 DIAGNOSIS — D473 Essential (hemorrhagic) thrombocythemia: Secondary | ICD-10-CM

## 2016-02-26 DIAGNOSIS — R5383 Other fatigue: Secondary | ICD-10-CM

## 2016-02-26 LAB — CHCC SATELLITE - SMEAR

## 2016-02-26 LAB — CBC WITH DIFFERENTIAL (CANCER CENTER ONLY)
BASO#: 0 10*3/uL (ref 0.0–0.2)
BASO%: 0.4 % (ref 0.0–2.0)
EOS ABS: 0.1 10*3/uL (ref 0.0–0.5)
EOS%: 1.4 % (ref 0.0–7.0)
HCT: 32.9 % — ABNORMAL LOW (ref 34.8–46.6)
HGB: 10.8 g/dL — ABNORMAL LOW (ref 11.6–15.9)
LYMPH#: 1.5 10*3/uL (ref 0.9–3.3)
LYMPH%: 25.7 % (ref 14.0–48.0)
MCH: 29.1 pg (ref 26.0–34.0)
MCHC: 32.8 g/dL (ref 32.0–36.0)
MCV: 89 fL (ref 81–101)
MONO#: 0.5 10*3/uL (ref 0.1–0.9)
MONO%: 8.8 % (ref 0.0–13.0)
NEUT#: 3.6 10*3/uL (ref 1.5–6.5)
NEUT%: 63.7 % (ref 39.6–80.0)
PLATELETS: 373 10*3/uL (ref 145–400)
RBC: 3.71 10*6/uL (ref 3.70–5.32)
RDW: 18.2 % — AB (ref 11.1–15.7)
WBC: 5.7 10*3/uL (ref 3.9–10.0)

## 2016-02-26 LAB — COMPREHENSIVE METABOLIC PANEL
ALT: 13 U/L (ref 0–55)
ANION GAP: 11 meq/L (ref 3–11)
AST: 14 U/L (ref 5–34)
Albumin: 3.6 g/dL (ref 3.5–5.0)
Alkaline Phosphatase: 77 U/L (ref 40–150)
BILIRUBIN TOTAL: 0.37 mg/dL (ref 0.20–1.20)
BUN: 12.6 mg/dL (ref 7.0–26.0)
CHLORIDE: 108 meq/L (ref 98–109)
CO2: 24 meq/L (ref 22–29)
Calcium: 9.4 mg/dL (ref 8.4–10.4)
Creatinine: 0.9 mg/dL (ref 0.6–1.1)
EGFR: 78 mL/min/{1.73_m2} — AB (ref >90)
Glucose: 91 mg/dl (ref 70–140)
POTASSIUM: 3.7 meq/L (ref 3.5–5.1)
Sodium: 143 mEq/L (ref 136–145)
Total Protein: 7.2 g/dL (ref 6.4–8.3)

## 2016-02-26 LAB — IRON AND TIBC
%SAT: 34 % (ref 21–57)
Iron: 79 ug/dL (ref 41–142)
TIBC: 231 ug/dL — AB (ref 236–444)
UIBC: 152 ug/dL (ref 120–384)

## 2016-02-26 LAB — FERRITIN: FERRITIN: 167 ng/mL (ref 9–269)

## 2016-02-26 LAB — LACTATE DEHYDROGENASE: LDH: 273 U/L — AB (ref 125–245)

## 2016-02-26 NOTE — Progress Notes (Signed)
Hematology and Oncology Follow Up Visit  [name] [id number] [birth year] 70 y.o. 02/26/2016   Principle Diagnosis:  Essential thrombocythemia-JAK2 POSITIVE Sickle cell trait  Current Therapy:   Hydrea 500 mg by mouth TID Folic acid 1 mg by mouth daily Aspirin 81 mg daily    Interim History:  [name] is here today for a follow-up. She is still a little fatigued since having the flu earlier this month. Her appetite has improved and she is staying well hydrated. Her weight is down 7 lbs since her last visit.  She has started going to the gym with her daughter and is really enjoying herself. She is doing well on Hydrea and platelet count at this time is 373. No episodes of bleeding or bruising.  No fever, chills, n/v, rash, dizziness, chest pain, palpitations, abdominal pain, constipation, diarrhea, blood in urine or stool.  Her endoscopy and colonoscopy went well in January. No sign of GI bleed. She had 2 benign polyps removed from the ascending and sigmoid colon.  No lymphadenopathy found on exam. She has SOB with exertion and a dry cough due to asthma. She carries an inhaler with her.  No swelling, tenderness, numbness or tingling in her extremities.   Medications:    Medication List       This list is accurate as of: 02/26/16 10:22 AM.  Always use your most recent med list.               aspirin EC 81 MG tablet  Take 81 mg by mouth daily.     budesonide-formoterol 160-4.5 MCG/ACT inhaler  Commonly known as:  SYMBICORT  Inhale 2 puffs into the lungs 2 (two) times daily.     cholecalciferol 1000 units tablet  Commonly known as:  VITAMIN D  Take 1,000 Units by mouth daily.     clonazePAM 0.5 MG tablet  Commonly known as:  KLONOPIN  TAKE 1 TABLET BY MOUTH AS NEEDED TWICE A DAY     fluticasone 50 MCG/ACT nasal spray  Commonly known as:  FLONASE  Place 1 spray into the nose 2 (two) times daily.     folic acid 1 MG tablet  Commonly known as:  FOLVITE  Take 1  tablet (1 mg total) by mouth daily.     hydroxyurea 500 MG capsule  Commonly known as:  HYDREA  Take 1 capsule (500 mg total) by mouth 3 (three) times daily.     Magnesium 200 MG Tabs  Take 1 tablet by mouth 2 (two) times daily.     NON FORMULARY  Take 1 capsule by mouth every morning. BLACK COHASH     omeprazole 20 MG capsule  Commonly known as:  PRILOSEC  TAKE 1 TABLET TWICE A DAY.     pravastatin 40 MG tablet  Commonly known as:  PRAVACHOL  Take 40 mg by mouth daily.     PROVENTIL HFA 108 (90 Base) MCG/ACT inhaler  Generic drug:  albuterol  Inhale 2 puffs into the lungs every 4 (four) hours as needed for wheezing or shortness of breath. Reported on 12/30/2015     albuterol (2.5 MG/3ML) 0.083% nebulizer solution  Commonly known as:  PROVENTIL  Take 2.5 mg by nebulization every 4 (four) hours as needed for shortness of breath. Reported on 12/30/2015     sertraline 50 MG tablet  Commonly known as:  ZOLOFT  Take 50 mg by mouth daily.        Allergies:  Allergies  Allergen  Reactions  . Penicillins Itching and Rash    Past Medical History, Surgical history, Social history, and Family History were reviewed and updated.  Review of Systems: All other 10 point review of systems is negative.   Physical Exam:  height is 5\' 2"  (1.575 m) and weight is 185 lb (83.915 kg). Her oral temperature is 98 F (36.7 C). Her blood pressure is 114/64 and her pulse is 89. Her respiration is 14.   Wt Readings from Last 3 Encounters:  02/26/16 185 lb (83.915 kg)  12/30/15 192 lb (87.091 kg)  12/10/15 188 lb (85.276 kg)    Ocular: Sclerae unicteric, pupils equal, round and reactive to light Ear-nose-throat: Oropharynx clear, dentition fair Lymphatic: No cervical supraclavicular or axillary adenopathy Lungs no rales or rhonchi, good excursion bilaterally Heart regular rate and rhythm, no murmur appreciated Abd soft, nontender, positive bowel sounds, no liver or spleen tip palpated on  exam, no fluid wave MSK no focal spinal tenderness, no joint edema Neuro: non-focal, well-oriented, appropriate affect Breasts: Deferred  Lab Results  Component Value Date   WBC 5.7 02/26/2016   HGB 10.8* 02/26/2016   HCT 32.9* 02/26/2016   MCV 89 02/26/2016   PLT 373 02/26/2016   Lab Results  Component Value Date   FERRITIN 211 12/10/2015   IRON 86 12/10/2015   TIBC 238 12/10/2015   UIBC 152 12/10/2015   IRONPCTSAT 36 12/10/2015   Lab Results  Component Value Date   RETICCTPCT 1.4 08/06/2015   RBC 3.71 02/26/2016   RETICCTABS 73.1 08/06/2015   No results found for: KPAFRELGTCHN, LAMBDASER, KAPLAMBRATIO No results found for: IGGSERUM, IGA, IGMSERUM No results found for: Kathrynn Ducking, MSPIKE, SPEI   Chemistry      Component Value Date/Time   NA 140 12/10/2015 0807   NA 141 08/06/2015 0800   NA 144 07/21/2014 0940   K 4.0 12/10/2015 0807   K 4.4 08/06/2015 0800   K 3.8 07/21/2014 0940   CL 107 08/06/2015 0800   CL 104 07/21/2014 0940   CO2 23 12/10/2015 0807   CO2 25 08/06/2015 0800   CO2 28 07/21/2014 0940   BUN 12.2 12/10/2015 0807   BUN 11 08/06/2015 0800   BUN 15 07/21/2014 0940   CREATININE 0.8 12/10/2015 0807   CREATININE 0.88 08/06/2015 0800   CREATININE 0.7 07/21/2014 0940      Component Value Date/Time   CALCIUM 9.5 12/10/2015 0807   CALCIUM 9.2 08/06/2015 0800   CALCIUM 9.1 07/21/2014 0940   ALKPHOS 66 12/10/2015 0807   ALKPHOS 64 08/06/2015 0800   ALKPHOS 63 07/21/2014 0940   AST 20 12/10/2015 0807   AST 13 08/06/2015 0800   AST 15 07/21/2014 0940   ALT 16 12/10/2015 0807   ALT 11 08/06/2015 0800   ALT 16 07/21/2014 0940   BILITOT 0.78 12/10/2015 0807   BILITOT 0.5 08/06/2015 0800   BILITOT 0.60 07/21/2014 0940     Impression and Plan: [name] is a 70 year old female with essential thrombocythemia. She has had some mild fatigue and is still recuperating from having the flu earlier this  month. She continues to do well on Hydrea and her platelet count is 373.  We will keep her on her current dose. She will also continue her daily aspirin and folic acid.  We will plan to see her back in 2 months for labs and follow-up.  She will contact our office with any questions or concerns. We can certainly  see her sooner if need be.   Eliezer Bottom, NP 3/24/201710:22 AM

## 2016-03-01 ENCOUNTER — Ambulatory Visit (INDEPENDENT_AMBULATORY_CARE_PROVIDER_SITE_OTHER): Payer: Medicare Other | Admitting: Emergency Medicine

## 2016-03-01 ENCOUNTER — Encounter: Payer: Self-pay | Admitting: Emergency Medicine

## 2016-03-01 VITALS — BP 122/86 | HR 90 | Ht 62.0 in | Wt 182.0 lb

## 2016-03-01 DIAGNOSIS — J4520 Mild intermittent asthma, uncomplicated: Secondary | ICD-10-CM | POA: Diagnosis not present

## 2016-03-01 DIAGNOSIS — J309 Allergic rhinitis, unspecified: Secondary | ICD-10-CM

## 2016-03-01 DIAGNOSIS — J383 Other diseases of vocal cords: Secondary | ICD-10-CM | POA: Diagnosis not present

## 2016-03-01 NOTE — Assessment & Plan Note (Signed)
Continue same medical regimen

## 2016-03-01 NOTE — Progress Notes (Signed)
Subjective:    Patient ID: [name], female    DOB: [birth year]   MRN: [id number] HPI 70 -year-old woman with a history of asthma and vocal cord dysfunction both of which have been significantly impacted by severe esophageal reflux. Has a hx esophageal stricture dilations by Dr [name]. On omeprazole 20mg  two times a day. Uses Symbicort bid.   ROV 06/19/12 -- Hx of VCD and asthma that have been exacerbated by GERD and allergic rhinitis. Formerly on chronic steroids but we successfully weaned to off. She tells me that she was admitted to [facility] for HA and CP, L arm numbness - ECG was reassuring. During that admit she had more trouble w her breathing, treated w short burst pred. Still w some allergic sx, taking fluticasone. Also on omeprazole bid. In general her control is OK - using her SABA about 2x a day.   Acute ov 12/10/12 cc much worse since 1/4/7 was doing well on just symbicort but abuptly worse on 1/4 ? p Ran out of symbicort and prilosec only using q am   with poor hfa was dry cough/ wheeze and sob now with min activity.  Was not using any saba  Then since exac using up to every 4 hours.  ROV 12/20/12 -- Hx of VCD and asthma that have been exacerbated by GERD and allergic rhinitis. She saw Dr [name] as above, treated for AE of her VCD with prednisone. She states that she never ran out of symbicort or omeprazole. Has also been seen in ED 2 days ago and rx solumedrol and more pred. No other associated sx or factors. She is having UA wheeze, cough (non-productive). She has stayed on the allegra and nasal steroid. Nothing has made this any better - not prednisone, BD's etc.   ROV 01/28/13 -- Hx of VCD and asthma that have been exacerbated by GERD and allergic rhinitis. She has been flaring since early January. We have been trying to aggressively treat allergies and GERD, weaned prednisone and stopped symbicort temporarily. She returns with improvement in her cough and UA wheeze, but she does have residual  cough. Unfortunately she is having more nasal congestion, ? URI. She is on allegra, fluticasone, omeprazole, [place].   ROV 05/08/13 -- labile VCD and asthma, severe GERD and rhinitis. Returns for f/u. Last time we restarted symbicort (stopped temporarily due to UA irritation). She remains on fluticasone, allegra, NSW, omeprazole bid. Has required albuterol nebs more frequently. Having more exertional SOB, sudden wheezing with exertion.   Follow up 07/31/13 Patient returns for a one-week followup. She was seen last week for an acute office visit for asthmatic flare. She was treated with a one-week course of prednisone taper. Patient reports that she is improved, however continues to have intermittent wheezing, and cough. She denies any discolored mucus, or fever, chest pain, orthopnea, PND, or leg swelling. Still has drainage in throat.  ROV 09/11/13 -- labile VCD and asthma, severe GERD and rhinitis. Was seen by TP in 8/14 for an acute flare, treated w pred. Returns today reporting that she is better. She notes that the prednisone gave her a vaginal rash, probably yeast infxn. She is on symbicort, albuterol prn. She is having wheeze in the am and mid-afternoon. Albuterol helps   Acute OV 12/31/13 -- labile VCD and asthma, severe GERD and rhinitis. She presents today with an acute flare > she had been well until yesterday when  She developed more cough, dyspnea, UA noise. Of note she just  started hydrea for her essential thrombocytopenia. She can't identify any discrete trigger that set her off. No Uri sx or sick contacts.   ROV 04/10/14 -- f/u for VCD and asthma, GERD, rhinitis. She improved after we treated her for a flare in January, no w better. She has had allergy sx, sneezing, burning eyes. Some wheeze and cough. Taking fluticasone bid.   08/08/14 Follow Up VCD/asthma/GERD [name] Pt complains of  increased SOB, nonprod cough X2 weeks. Some nasal drainage  No fever, discolored mucus, edema or hemoptysis. No  recent travel or abx use.  Doing well until last couple of weeks.  Interested in [place].  We discussed flu shot .  Husband died 2 weeks ago from MI , under some stress .  Needs refill of symbicort .   post hospital follow-up- VCD\asthma.\GERD\allergic rhinitis Patient presented for a post hospital follow-up. Patient was admitted January 2 to January 9 for a asthma exacerbation She was treated with aggressive course of IV antibiotics, steroids, and nebulized bronchodilators. CT chest was negative for pulmonary embolism but did show a left lower lobe segmental collapse She did require bronchoscopy with clearance of mucus plugging. Since discharge. Patient is feeling improved.  She denies any chest pain, orthopnea, PND or leg swelling Does complain of a rash along her right side of her buttocks. Complains that it burns and is painful to sit  ROV 07/02/15 -- follow-up visit for vocal cord dysfunction and chronic cough in the setting of allergic rhinitis and GERD. She likely also has true asthma with a lot of crossover.  She is having daily symptoms, happens in the am and also when she tries to exert her self. She has been maintained on Symbicort, also has Spiriva since hospitalization but not using reliably. Uses albuterol prn, not every day. Remains on flonase qd, loratadine qd. She has occasional paroxysmal cough for 2-3 days, not currently.   ROV 11/03/15 -- follow-up visit for chronic cough and vocal cord dysfunction in the setting of GERD and allergic rhinitis. Her pulmonary function testing is consistent with possible trigger bronchospasm as well. Currently maintained on Symbicort, stopped Spiriva last time. She tells me that she has been more SOB for the last 4-5 months.  Happens when she wakes up in the am, when she walks about 50 feet. She is able to shop with a cart. Her wheeze is currently well controlled. She is having some cough, had an episode in [place] 3 days ago of severe cough then  dyspnea, better with albuterol neb. On flonase reliably - rhinitis and upper airway irritation appear to be fairly stable at this time  ROV 03/01/16 -- follow-up visit for history of chronic cough, focal dysfunction with probable superimposed asthma, GERD and allergic rhinitis. She had been doing fairly well until about 3 weeks ago when she had a URI.  Her URI sx have improved but her cough has continued. She had EGD that was reassuring.  She remains on nasal steroid, Symbicort. Has not needed albuterol recently, did ned it during her URI.   ROS:  As per HPI.    Objective:   Physical Exam Filed Vitals:   03/01/16 1421  BP: 122/86  Pulse: 90  Height: 5\' 2"  (1.575 m)  Weight: 182 lb (82.555 kg)  SpO2: 100%   Gen: Pleasant, overwt, in no distress,  normal affect,   ENT: No lesions,  mouth clear,  oropharynx clear, no postnasal drip  Neck: supple no JVD, coarse UA noise, insp and exp  stridor  Lungs : Decreased BS w/ no wheezing   Cardiovascular: RRR, heart sounds normal, no murmur or gallops, no peripheral edema  Musculoskeletal: No deformities, no cyanosis or clubbing  Neuro: alert, non focal  Skin: Warm,     Assessment & Plan:   Intrinsic asthma Appears to be fairly well controlled at this time although her exam is difficult due to upper airway noise. She does have exertional dyspnea that seems to respond to supplemental Albuterol. We will continue Symbicort, use albuterol as needed. We'll provide her with an HFA in addition to her nebs  Allergic rhinitis Continue same medical regimen  Vocal cord dysfunction Continue  same regimen, GERD and allergy treatment, reassurance.

## 2016-03-01 NOTE — Assessment & Plan Note (Signed)
Continue  same regimen, GERD and allergy treatment, reassurance.

## 2016-03-01 NOTE — Patient Instructions (Signed)
Please continue your Symbicort twice a day. Rinse after using.  Use your albuterol nebulizer for shortness of breath or before significant exertion.  We will give you an albuterol inhaler to have available when you are away from home.  Continue your other medications as you have been using them  Try using using Delsym as directed to help with cough.  Follow with [name] in 4 months or sooner if you have any problems.

## 2016-03-01 NOTE — Assessment & Plan Note (Signed)
Appears to be fairly well controlled at this time although her exam is difficult due to upper airway noise. She does have exertional dyspnea that seems to respond to supplemental Albuterol. We will continue Symbicort, use albuterol as needed. We'll provide her with an HFA in addition to her nebs

## 2016-03-04 ENCOUNTER — Telehealth: Payer: Self-pay | Admitting: Emergency Medicine

## 2016-03-04 MED ORDER — ALBUTEROL SULFATE HFA 108 (90 BASE) MCG/ACT IN AERS: 2.0000 | INHALATION_SPRAY | RESPIRATORY_TRACT | Status: DC | PRN

## 2016-03-04 NOTE — Telephone Encounter (Signed)
Patient states that she was supposed to get a prescription for rescue inhaler, but her pharmacy did not receive Rx.  Rx sent. Patient aware. Nothing further needed.

## 2016-03-29 MED FILL — HYDROXYUREA 500 MG CAPSULE: 500 | 30 days supply | Qty: 90 | Fill #4

## 2016-04-07 ENCOUNTER — Telehealth: Payer: Self-pay | Admitting: Emergency Medicine

## 2016-04-07 NOTE — Telephone Encounter (Signed)
Spoke with [place]. States that she went to the pt's home today. Pt was complaining of DOE and wheezing. She has not been using her albuterol due to the cost. Sonya told the pt that she needs to contact our office in order to be seen about her symptoms.

## 2016-04-15 ENCOUNTER — Encounter: Payer: Self-pay | Admitting: Adult Health

## 2016-04-15 ENCOUNTER — Ambulatory Visit (INDEPENDENT_AMBULATORY_CARE_PROVIDER_SITE_OTHER): Payer: Medicare Other | Admitting: Adult Health

## 2016-04-15 VITALS — BP 112/72 | HR 91 | Temp 97.9°F | Ht 62.0 in | Wt 181.0 lb

## 2016-04-15 DIAGNOSIS — J4521 Mild intermittent asthma with (acute) exacerbation: Secondary | ICD-10-CM

## 2016-04-15 MED ORDER — ALBUTEROL SULFATE HFA 108 (90 BASE) MCG/ACT IN AERS: 2.0000 | INHALATION_SPRAY | Freq: Four times a day (QID) | RESPIRATORY_TRACT | Status: DC | PRN

## 2016-04-15 MED ORDER — PREDNISONE 10 MG PO TABS: ORAL_TABLET | ORAL | Status: DC

## 2016-04-15 NOTE — Progress Notes (Signed)
Subjective:    Patient ID: [name], female    DOB: 04-Aug-1946   MRN: [id number] HPI 70  -year-old woman with a history of asthma and vocal cord dysfunction both of which have been significantly impacted by severe esophageal reflux. Has a hx esophageal stricture dilations    Formerly on chronic steroids but we successfully weaned to off in past.   04/15/2016 Acute OV : VCD\asthma.\GERD\allergic rhinitis Patient presents for an acute office visit He complains of increased SOB, dry cough, wheezing, occ chest tightness x 3 weeks. Says asthma has been acting up with all the pollen outside.   Denies any sinus pressure/drainage, fever, nausea or vomiting. Denies discolored mucus.    Past Medical History  Diagnosis Date  . Allergic rhinitis   . Asthma   . GERD (gastroesophageal reflux disease)   . Hyperlipidemia   . Vocal cord dysfunction   . Esophageal stricture   . Colonic polyp   . Osteoporosis   . DJD (degenerative joint disease) of knee     bilateral  . Hiatal hernia   . DDD (degenerative disc disease), lumbar 07/20/2011  . DJD (degenerative joint disease), lumbar 07/20/2011  . Anemia   . Stroke [facility])    Current Outpatient Prescriptions on File Prior to Visit  Medication Sig Dispense Refill  . albuterol (PROVENTIL HFA) 108 (90 Base) MCG/ACT inhaler Inhale 2 puffs into the lungs every 4 (four) hours as needed for wheezing or shortness of breath. Reported on 12/30/2015 1 Inhaler 6  . albuterol (PROVENTIL) (2.5 MG/3ML) 0.083% nebulizer solution Take 2.5 mg by nebulization every 4 (four) hours as needed for shortness of breath. Reported on 12/30/2015    . aspirin EC 81 MG tablet Take 81 mg by mouth daily.    . budesonide-formoterol (SYMBICORT) 160-4.5 MCG/ACT inhaler Inhale 2 puffs into the lungs 2 (two) times daily. 10.2 g 5  . cholecalciferol (VITAMIN D) 1000 UNITS tablet Take 1,000 Units by mouth daily.    . clonazePAM (KLONOPIN) 0.5 MG tablet TAKE 1 TABLET BY MOUTH AS NEEDED  TWICE A DAY  0  . fluticasone (FLONASE) 50 MCG/ACT nasal spray Place 1 spray into the nose 2 (two) times daily.     . folic acid (FOLVITE) 1 MG tablet Take 1 tablet (1 mg total) by mouth daily. 90 tablet 3  . hydroxyurea (HYDREA) 500 MG capsule Take 1 capsule (500 mg total) by mouth 3 (three) times daily. 90 capsule 6  . Magnesium 200 MG TABS Take 1 tablet by mouth 2 (two) times daily.     . NON FORMULARY Take 1 capsule by mouth every morning. BLACK COHASH    . omeprazole (PRILOSEC) 20 MG capsule TAKE 1 TABLET TWICE A DAY. 180 capsule 1  . pravastatin (PRAVACHOL) 40 MG tablet Take 40 mg by mouth daily.    . sertraline (ZOLOFT) 50 MG tablet Take 50 mg by mouth daily.     No current facility-administered medications on file prior to visit.    ROS:  Constitutional:   No  weight loss, night sweats,  Fevers, chills, fatigue, or  lassitude.  HEENT:   No headaches,  Difficulty swallowing,  Tooth/dental problems, or  Sore throat,                No sneezing, itching, ear ache, nasal congestion, post nasal drip,   CV:  No chest pain,  Orthopnea, PND, swelling in lower extremities, anasarca, dizziness, palpitations, syncope.   GI  No heartburn, indigestion, abdominal  pain, nausea, vomiting, diarrhea, change in bowel habits, loss of appetite, bloody stools.   Resp:   No chest wall deformity  Skin: no rash or lesions.  GU: no dysuria, change in color of urine, no urgency or frequency.  No flank pain, no hematuria   MS:  No joint pain or swelling.  No decreased range of motion.  No back pain.  Psych:  No change in mood or affect. No depression or anxiety.  No memory loss.          Objective:   Physical Exam Filed Vitals:   04/15/16 1036  BP: 112/72  Pulse: 91  Temp: 97.9 F (36.6 C)  TempSrc: Oral  Height: 5\' 2"  (1.575 m)  Weight: 181 lb (82.101 kg)  SpO2: 100%   Gen: Pleasant, overwt, in no distress,  normal affect,   ENT: No lesions,  mouth clear,  oropharynx clear, no  postnasal drip  Neck: supple no JVD, no stridor   Lungs : Decreased BS w/ exp wheezing +upper airway psuedowheezing. Speaks in full sentenences  Cardiovascular: RRR, heart sounds normal, no murmur or gallops, no peripheral edema  Musculoskeletal: No deformities, no cyanosis or clubbing  Neuro: alert, non focal  Skin: Warm, w/ no rash  Teleshia Lemere NP-C  [facility]  04/15/2016     Assessment & Plan:

## 2016-04-15 NOTE — Assessment & Plan Note (Signed)
Exacerbation complicated by VCD  Cont w/ trigger control   Plan  Prednisone taper over next week .  Please continue your Symbicort twice a day, rinse after use.  Use albuterol when needed Continue your fluticasone nose spray twice a day.  Use loratadine 10mg  (Claritin) once a day.  Follow with Dr Lamonte Sakai in 2  months or sooner if you have any problems. Please contact office for sooner follow up if symptoms do not improve or worsen or seek emergency care

## 2016-04-15 NOTE — Patient Instructions (Signed)
Prednisone taper over next week .  Please continue your Symbicort twice a day, rinse after use.  Use albuterol when needed Continue your fluticasone nose spray twice a day.  Use loratadine 10mg  (Claritin) once a day.  Follow with Dr Lamonte Sakai in 2  months or sooner if you have any problems. Please contact office for sooner follow up if symptoms do not improve or worsen or seek emergency care

## 2016-04-29 ENCOUNTER — Other Ambulatory Visit: Payer: Medicare Other

## 2016-04-29 ENCOUNTER — Ambulatory Visit: Payer: Medicare Other | Admitting: Family

## 2016-05-18 MED FILL — HYDROXYUREA 500 MG CAPSULE: 500 | 30 days supply | Qty: 90 | Fill #5

## 2016-05-30 ENCOUNTER — Telehealth: Payer: Self-pay | Admitting: Emergency Medicine

## 2016-05-30 NOTE — Telephone Encounter (Signed)
Spoke with pt. She needs samples of Symbicort. Samples have been left at the front desk for pick up. Nothing further was needed.

## 2016-06-17 MED FILL — HYDROXYUREA 500 MG CAPSULE: 500 | 30 days supply | Qty: 90 | Fill #6

## 2016-06-29 ENCOUNTER — Ambulatory Visit (INDEPENDENT_AMBULATORY_CARE_PROVIDER_SITE_OTHER): Payer: Medicare Other | Admitting: Emergency Medicine

## 2016-06-29 ENCOUNTER — Other Ambulatory Visit (INDEPENDENT_AMBULATORY_CARE_PROVIDER_SITE_OTHER): Payer: Medicare Other

## 2016-06-29 ENCOUNTER — Encounter: Payer: Self-pay | Admitting: Emergency Medicine

## 2016-06-29 DIAGNOSIS — J383 Other diseases of vocal cords: Secondary | ICD-10-CM

## 2016-06-29 DIAGNOSIS — J309 Allergic rhinitis, unspecified: Secondary | ICD-10-CM | POA: Diagnosis not present

## 2016-06-29 DIAGNOSIS — J4521 Mild intermittent asthma with (acute) exacerbation: Secondary | ICD-10-CM | POA: Diagnosis not present

## 2016-06-29 MED ORDER — PREDNISONE 10 MG PO TABS: ORAL_TABLET | ORAL | 0 refills | Status: DC

## 2016-06-29 NOTE — Assessment & Plan Note (Addendum)
Continue current Fluticasone nasal spray

## 2016-06-29 NOTE — Assessment & Plan Note (Signed)
Stridor with acute dyspnea consistent with a flare of her vocal cord dysfunction. She is being treated apparently effectively for her GERD and her rhinitis. She is in a cycle of recurrent prednisone use that I would like to try to break but unclear how to accomplish this. I will give her prednisone taper now and we will continue to push to control her exacerbating factors

## 2016-06-29 NOTE — Progress Notes (Signed)
Subjective:    Patient ID: [name], female    DOB: 06/13/1946   MRN: [id number] HPI 70 -year-old woman with a history of asthma and vocal cord dysfunction both of which have been significantly impacted by severe esophageal reflux. Has a hx esophageal stricture dilations by Dr Henrene Pastor. On omeprazole 20mg  two times a day. Uses Symbicort bid.    ROV 07/02/15 -- follow-up visit for vocal cord dysfunction and chronic cough in the setting of allergic rhinitis and GERD. She likely also has true asthma with a lot of crossover.  She is having daily symptoms, happens in the am and also when she tries to exert her self. She has been maintained on Symbicort, also has Spiriva since hospitalization but not using reliably. Uses albuterol prn, not every day. Remains on flonase qd, loratadine qd. She has occasional paroxysmal cough for 2-3 days, not currently.   ROV 11/03/15 -- follow-up visit for chronic cough and vocal cord dysfunction in the setting of GERD and allergic rhinitis. Her pulmonary function testing is consistent with possible trigger bronchospasm as well. Currently maintained on Symbicort, stopped Spiriva last time. She tells me that she has been more SOB for the last 4-5 months.  Happens when she wakes up in the am, when she walks about 50 feet. She is able to shop with a cart. Her wheeze is currently well controlled. She is having some cough, had an episode in [place] 3 days ago of severe cough then dyspnea, better with albuterol neb. On flonase reliably - rhinitis and upper airway irritation appear to be fairly stable at this time  ROV 03/01/16 -- follow-up visit for history of chronic cough, focal dysfunction with probable superimposed asthma, GERD and allergic rhinitis. She had been doing fairly well until about 3 weeks ago when she had a URI.  Her URI sx have improved but her cough has continued. She had EGD that was reassuring.  She remains on nasal steroid, Symbicort. Has not needed albuterol  recently, did ned it during her URI.   ROV 06/29/16 -- This is a follow-up visit for patient with severe upper airway irritation syndrome, vocal cord dysfunction, and superimposed asthma. All this is influenced by a history of GERD and allergic rhinitis. She had an acute flare in May for which she was treated with a prednisone taper. She was having dyspnea, cough and increased SABA use. She did improve. Now for the last 5 days having increase exertional SOB, better w rest. Some throat noise as well. ? Trigger, she doesn't recall any.  She is on symbicort, prilosec bid, flonase.   ROS:  As per HPI.    Objective:   Physical Exam Vitals:   06/29/16 1616  BP: 100/70  BP Location: Right Arm  Cuff Size: Normal  Pulse: (!) 101  SpO2: 97%  Weight: 186 lb (84.4 kg)  Height: 5\' 2"  (1.575 m)   Gen: Pleasant, overwt, in no distress,  normal affect,   ENT: No lesions,  mouth clear,  oropharynx clear, no postnasal drip  Neck: supple no JVD, coarse UA noise, insp and exp stridor  Lungs : Decreased BS w/ no wheezing   Cardiovascular: RRR, heart sounds normal, no murmur or gallops, no peripheral edema  Musculoskeletal: No deformities, no cyanosis or clubbing  Neuro: alert, non focal  Skin: Warm,     Assessment & Plan:   Allergic rhinitis Continue current Fluticasone nasal spray  Intrinsic asthma Her asthma has actually been fairly stable. She is more  dyspneic but believe that this is upper airway in nature (as it often is). Even so I believe will be reasonable for [place] to check an IgE and eosinophil count today to assess her baseline off of steroids.  Vocal cord dysfunction Stridor with acute dyspnea consistent with a flare of her vocal cord dysfunction. She is being treated apparently effectively for her GERD and her rhinitis. She is in a cycle of recurrent prednisone use that I would like to try to break but unclear how to accomplish this. I will give her prednisone taper now and we will  continue to push to control her exacerbating factors  Baltazar Apo, MD, PhD 06/29/2016, 4:36 PM [facility] [phone] or if no answer [phone]

## 2016-06-29 NOTE — Addendum Note (Signed)
Addended by: Desmond Dike C on: 06/29/2016 04:40 PM   Modules accepted: Orders

## 2016-06-29 NOTE — Patient Instructions (Signed)
Please continue your Symbicort as you are taking it Use albuterol as needed Continue flonase and omeprazole as you are taking it.  Prednisone taper - take until completely gone.  We will perform blood work today Follow with Dr Lamonte Sakai next available to review your status and your labs.

## 2016-06-29 NOTE — Assessment & Plan Note (Signed)
Her asthma has actually been fairly stable. She is more dyspneic but believe that this is upper airway in nature (as it often is). Even so I believe will be reasonable for [place] to check an IgE and eosinophil count today to assess her baseline off of steroids.

## 2016-06-30 LAB — CBC WITH DIFFERENTIAL/PLATELET
BASOS PCT: 0.3 % (ref 0.0–3.0)
Basophils Absolute: 0 10*3/uL (ref 0.0–0.1)
EOS ABS: 0.1 10*3/uL (ref 0.0–0.7)
Eosinophils Relative: 1 % (ref 0.0–5.0)
HEMATOCRIT: 38.7 % (ref 36.0–46.0)
Hemoglobin: 12.1 g/dL (ref 12.0–15.0)
LYMPHS PCT: 26.9 % (ref 12.0–46.0)
Lymphs Abs: 1.4 10*3/uL (ref 0.7–4.0)
MCHC: 31.4 g/dL (ref 30.0–36.0)
MCV: 86.1 fl (ref 78.0–100.0)
MONOS PCT: 5.5 % (ref 3.0–12.0)
Monocytes Absolute: 0.3 10*3/uL (ref 0.1–1.0)
NEUTROS ABS: 3.6 10*3/uL (ref 1.4–7.7)
Neutrophils Relative %: 66.3 % (ref 43.0–77.0)
PLATELETS: 266 10*3/uL (ref 150.0–400.0)
RBC: 4.5 Mil/uL (ref 3.87–5.11)
RDW: 16.1 % — AB (ref 11.5–15.5)
WBC: 5.4 10*3/uL (ref 4.0–10.5)

## 2016-06-30 LAB — IGE: IgE (Immunoglobulin E), Serum: 11 kU/L (ref <115)

## 2016-07-01 ENCOUNTER — Ambulatory Visit: Payer: Medicare Other | Admitting: Emergency Medicine

## 2016-07-13 ENCOUNTER — Encounter: Payer: Self-pay | Admitting: Emergency Medicine

## 2016-07-13 ENCOUNTER — Ambulatory Visit (INDEPENDENT_AMBULATORY_CARE_PROVIDER_SITE_OTHER): Payer: Medicare Other | Admitting: Emergency Medicine

## 2016-07-13 VITALS — BP 112/74 | HR 83 | Ht 62.0 in | Wt 189.0 lb

## 2016-07-13 DIAGNOSIS — G4733 Obstructive sleep apnea (adult) (pediatric): Secondary | ICD-10-CM

## 2016-07-13 DIAGNOSIS — J309 Allergic rhinitis, unspecified: Secondary | ICD-10-CM | POA: Diagnosis not present

## 2016-07-13 DIAGNOSIS — J383 Other diseases of vocal cords: Secondary | ICD-10-CM | POA: Diagnosis not present

## 2016-07-13 DIAGNOSIS — R0683 Snoring: Secondary | ICD-10-CM

## 2016-07-13 DIAGNOSIS — J4521 Mild intermittent asthma with (acute) exacerbation: Secondary | ICD-10-CM

## 2016-07-13 MED ORDER — BUDESONIDE-FORMOTEROL FUMARATE 160-4.5 MCG/ACT IN AERO: 2.0000 | INHALATION_SPRAY | Freq: Two times a day (BID) | RESPIRATORY_TRACT | 0 refills | Status: DC

## 2016-07-13 NOTE — Assessment & Plan Note (Signed)
Continue current regimen

## 2016-07-13 NOTE — Addendum Note (Signed)
Addended by: Desmond Dike C on: 07/13/2016 05:11 PM   Modules accepted: Orders

## 2016-07-13 NOTE — Patient Instructions (Signed)
We will not restart prednisone at this time.  Try to restart your exercise as you are able to do so.  Please continue your Symbicort We will perform a sleep study  Please continue your flonase and omeprazole .  Follow with Dr Lamonte Sakai in 3 months or sooner if you have any problems.

## 2016-07-13 NOTE — Assessment & Plan Note (Signed)
The stable at this time. Her exertional dyspnea is likely related to deconditioning and absence of wheezing cough etc. Her obstruction and upper airway noise is much more significant than her bronchospasm

## 2016-07-13 NOTE — Assessment & Plan Note (Signed)
Continues to be active with stridor on exam but improved compared with last visit. We need to continue to try and treat her GERD and allergic rhinitis aggressively. Question whether we may be available to improve her upper airway by diagnosing and treating obstructive Sleep apnea if present

## 2016-07-13 NOTE — Assessment & Plan Note (Signed)
We will perform a split night sleep study and review the results at her next visit

## 2016-07-13 NOTE — Progress Notes (Signed)
Subjective:    Patient ID: [name], female    DOB: 1946/09/27   MRN: [id number] HPI 70 -year-old woman with a history of asthma and vocal cord dysfunction both of which have been significantly impacted by severe esophageal reflux. Has a hx esophageal stricture dilations by Dr Henrene Pastor. On omeprazole 20mg  two times a day. Uses Symbicort bid.    ROV 07/02/15 -- follow-up visit for vocal cord dysfunction and chronic cough in the setting of allergic rhinitis and GERD. She likely also has true asthma with a lot of crossover.  She is having daily symptoms, happens in the am and also when she tries to exert her self. She has been maintained on Symbicort, also has Spiriva since hospitalization but not using reliably. Uses albuterol prn, not every day. Remains on flonase qd, loratadine qd. She has occasional paroxysmal cough for 2-3 days, not currently.   ROV 11/03/15 -- follow-up visit for chronic cough and vocal cord dysfunction in the setting of GERD and allergic rhinitis. Her pulmonary function testing is consistent with possible trigger bronchospasm as well. Currently maintained on Symbicort, stopped Spiriva last time. She tells me that she has been more SOB for the last 4-5 months.  Happens when she wakes up in the am, when she walks about 50 feet. She is able to shop with a cart. Her wheeze is currently well controlled. She is having some cough, had an episode in [place] 3 days ago of severe cough then dyspnea, better with albuterol neb. On flonase reliably - rhinitis and upper airway irritation appear to be fairly stable at this time  ROV 03/01/16 -- follow-up visit for history of chronic cough, focal dysfunction with probable superimposed asthma, GERD and allergic rhinitis. She had been doing fairly well until about 3 weeks ago when she had a URI.  Her URI sx have improved but her cough has continued. She had EGD that was reassuring.  She remains on nasal steroid, Symbicort. Has not needed albuterol  recently, did ned it during her URI.   ROV 06/29/16 -- This is a follow-up visit for patient with severe upper airway irritation syndrome, vocal cord dysfunction, and superimposed asthma. All this is influenced by a history of GERD and allergic rhinitis. She had an acute flare in May for which she was treated with a prednisone taper. She was having dyspnea, cough and increased SABA use. She did improve. Now for the last 5 days having increase exertional SOB, better w rest. Some throat noise as well. ? Trigger, she doesn't recall any.  She is on symbicort, prilosec bid, flonase.   ROV 07/13/16 -- On visit for patient with vocal cord dysfunction, upper airway irritation syndrome, and superimposed asthma. She has GERD and allergic rhinitis which exacerbate all of the above. I saw her 06/29/16, treated her with a prednisone taper, continued her Flonase, omeprazole. She had an IgE that was normal at 11, Eosinophil count 0.1.  She remains SOB w exertion, her cough may be a bit better since the prednisone.  She remains on symbicort.  She does not sleep well, she believes that she snores.  Her weight has gone up with the pred use.   ROS:  As per HPI.    Objective:   Physical Exam Vitals:   07/13/16 1640 07/13/16 1641  BP:  112/74  BP Location:  Left Arm  Cuff Size:  Normal  Pulse:  83  SpO2:  95%  Weight: 189 lb (85.7 kg)   Height: 5'  2" (1.575 m)    Gen: Pleasant, overwt, in no distress,  normal affect,   ENT: No lesions,  mouth clear,  oropharynx clear, no postnasal drip  Neck: supple no JVD, coarse UA noise, insp and exp stridor  Lungs : Decreased BS w/ no wheezing   Cardiovascular: RRR, heart sounds normal, no murmur or gallops, no peripheral edema  Musculoskeletal: No deformities, no cyanosis or clubbing  Neuro: alert, non focal  Skin: Warm,     Assessment & Plan:   Allergic rhinitis Continue current regimen  Intrinsic asthma The stable at this time. Her exertional dyspnea is  likely related to deconditioning and absence of wheezing cough etc. Her obstruction and upper airway noise is much more significant than her bronchospasm  Vocal cord dysfunction Continues to be active with stridor on exam but improved compared with last visit. We need to continue to try and treat her GERD and allergic rhinitis aggressively. Question whether we may be available to improve her upper airway by diagnosing and treating obstructive Sleep apnea if present  Snoring We will perform a split night sleep study and review the results at her next visit  Baltazar Apo, MD, PhD 07/13/2016, 5:07 PM [facility] [phone] or if no answer [phone]

## 2016-07-29 ENCOUNTER — Other Ambulatory Visit: Payer: Self-pay | Admitting: Hematology & Oncology

## 2016-07-29 DIAGNOSIS — D573 Sickle-cell trait: Secondary | ICD-10-CM

## 2016-07-29 DIAGNOSIS — D473 Essential (hemorrhagic) thrombocythemia: Secondary | ICD-10-CM

## 2016-07-29 MED FILL — HYDROXYUREA 500 MG CAPSULE: 500 | 30 days supply | Qty: 90 | Fill #0

## 2016-08-16 ENCOUNTER — Ambulatory Visit (HOSPITAL_BASED_OUTPATIENT_CLINIC_OR_DEPARTMENT_OTHER): Payer: Medicare Other | Admitting: Family

## 2016-08-16 ENCOUNTER — Other Ambulatory Visit (HOSPITAL_BASED_OUTPATIENT_CLINIC_OR_DEPARTMENT_OTHER): Payer: Medicare Other

## 2016-08-16 VITALS — BP 121/68 | HR 90 | Temp 98.1°F | Resp 16 | Ht 62.0 in | Wt 193.0 lb

## 2016-08-16 DIAGNOSIS — D573 Sickle-cell trait: Secondary | ICD-10-CM

## 2016-08-16 DIAGNOSIS — D473 Essential (hemorrhagic) thrombocythemia: Secondary | ICD-10-CM

## 2016-08-16 LAB — CBC WITH DIFFERENTIAL (CANCER CENTER ONLY)
BASO#: 0 10*3/uL (ref 0.0–0.2)
BASO%: 0.6 % (ref 0.0–2.0)
EOS ABS: 0.1 10*3/uL (ref 0.0–0.5)
EOS%: 1.5 % (ref 0.0–7.0)
HCT: 35.6 % (ref 34.8–46.6)
HGB: 11.8 g/dL (ref 11.6–15.9)
LYMPH#: 1.8 10*3/uL (ref 0.9–3.3)
LYMPH%: 34.4 % (ref 14.0–48.0)
MCH: 27.4 pg (ref 26.0–34.0)
MCHC: 33.1 g/dL (ref 32.0–36.0)
MCV: 83 fL (ref 81–101)
MONO#: 0.4 10*3/uL (ref 0.1–0.9)
MONO%: 8.3 % (ref 0.0–13.0)
NEUT%: 55.2 % (ref 39.6–80.0)
NEUTROS ABS: 2.9 10*3/uL (ref 1.5–6.5)
PLATELETS: 384 10*3/uL (ref 145–400)
RBC: 4.3 10*6/uL (ref 3.70–5.32)
RDW: 16.8 % — ABNORMAL HIGH (ref 11.1–15.7)
WBC: 5.3 10*3/uL (ref 3.9–10.0)

## 2016-08-16 LAB — COMPREHENSIVE METABOLIC PANEL
ALT: 18 U/L (ref 0–55)
AST: 19 U/L (ref 5–34)
Albumin: 3.2 g/dL — ABNORMAL LOW (ref 3.5–5.0)
Alkaline Phosphatase: 80 U/L (ref 40–150)
Anion Gap: 9 mEq/L (ref 3–11)
BILIRUBIN TOTAL: 0.37 mg/dL (ref 0.20–1.20)
BUN: 10.1 mg/dL (ref 7.0–26.0)
CO2: 24 meq/L (ref 22–29)
Calcium: 9.1 mg/dL (ref 8.4–10.4)
Chloride: 109 mEq/L (ref 98–109)
Creatinine: 0.9 mg/dL (ref 0.6–1.1)
EGFR: 74 mL/min/{1.73_m2} — AB (ref >90)
GLUCOSE: 112 mg/dL (ref 70–140)
Potassium: 3.9 mEq/L (ref 3.5–5.1)
SODIUM: 142 meq/L (ref 136–145)
TOTAL PROTEIN: 6.7 g/dL (ref 6.4–8.3)

## 2016-08-16 LAB — LACTATE DEHYDROGENASE: LDH: 268 U/L — ABNORMAL HIGH (ref 125–245)

## 2016-08-16 LAB — CHCC SATELLITE - SMEAR

## 2016-08-16 NOTE — Progress Notes (Signed)
Hematology and Oncology Follow Up Visit  [name] [id number] [birth year] 70 y.o. 08/16/2016   Principle Diagnosis:  Essential thrombocythemia-JAK2 POSITIVE Sickle cell trait  Current Therapy:   Hydrea 500 mg by mouth TID Folic acid 1 mg by mouth daily Aspirin 81 mg daily    Interim History:  [name] is here today for a follow-up. She is having arthritic pain in her shoulders and legs. She has history of degenerative disc disease in the cervical and lumbar spine. She has had leg cramps as well. She is taking a magnesium supplement over the counter.  She has been having exacerbations of her asthma lately. She states that her PCP treated her with a steroid dose pack. She is using an inhaler at home and nebulizer. She bas bilateral wheezes throughout today and plans to follow-up with her PCP again today. She does see pulmonology regularly as well. She is not SOB or in distress at this time.  She continues to do well on Hydrea and platelet count at this time is 384. No episodes of bleeding or bruising.  No fever, chills, n/v, cough, rash, dizziness, chest pain, palpitations, abdominal pain or changes in bowel or bladder habits.  No lymphadenopathy found on exam.  No swelling, tenderness, numbness or tingling in her extremities.  She has maintained a good appetite and is staying well hydrated. Her weight is stable.   Medications:    Medication List       Accurate as of 08/16/16  1:09 PM. Always use your most recent med list.          albuterol (2.5 MG/3ML) 0.083% nebulizer solution Commonly known as:  PROVENTIL Take 2.5 mg by nebulization every 4 (four) hours as needed for shortness of breath. Reported on 12/30/2015   albuterol 108 (90 Base) MCG/ACT inhaler Commonly known as:  PROVENTIL HFA Inhale 2 puffs into the lungs every 4 (four) hours as needed for wheezing or shortness of breath. Reported on 12/30/2015   aspirin EC 81 MG tablet Take 81 mg by mouth daily.     budesonide-formoterol 160-4.5 MCG/ACT inhaler Commonly known as:  SYMBICORT Inhale 2 puffs into the lungs 2 (two) times daily.   budesonide-formoterol 160-4.5 MCG/ACT inhaler Commonly known as:  SYMBICORT Inhale 2 puffs into the lungs 2 (two) times daily.   cholecalciferol 1000 units tablet Commonly known as:  VITAMIN D Take 1,000 Units by mouth daily.   clonazePAM 0.5 MG tablet Commonly known as:  KLONOPIN TAKE 1 TABLET BY MOUTH AS NEEDED TWICE A DAY   fluticasone 50 MCG/ACT nasal spray Commonly known as:  FLONASE Place 1 spray into the nose 2 (two) times daily.   folic acid 1 MG tablet Commonly known as:  FOLVITE Take 1 tablet (1 mg total) by mouth daily.   hydroxyurea 500 MG capsule Commonly known as:  HYDREA TAKE 1 CAPSULE (500 MG TOTAL) BY MOUTH 3 (THREE) TIMES DAILY.   Magnesium 200 MG Tabs Take 1 tablet by mouth 2 (two) times daily.   NON FORMULARY Take 1 capsule by mouth every morning. BLACK COHASH   omeprazole 20 MG capsule Commonly known as:  PRILOSEC TAKE 1 TABLET TWICE A DAY.   pravastatin 40 MG tablet Commonly known as:  PRAVACHOL Take 40 mg by mouth daily.   sertraline 50 MG tablet Commonly known as:  ZOLOFT Take 50 mg by mouth daily.       Allergies:  Allergies  Allergen Reactions  . Penicillins Itching and Rash  Past Medical History, Surgical history, Social history, and Family History were reviewed and updated.  Review of Systems: All other 10 point review of systems is negative.   Physical Exam:  height is 5\' 2"  (1.575 m) and weight is 193 lb (87.5 kg). Her oral temperature is 98.1 F (36.7 C). Her blood pressure is 121/68 and her pulse is 90. Her respiration is 16.   Wt Readings from Last 3 Encounters:  08/16/16 193 lb (87.5 kg)  07/13/16 189 lb (85.7 kg)  06/29/16 186 lb (84.4 kg)    Ocular: Sclerae unicteric, pupils equal, round and reactive to light Ear-nose-throat: Oropharynx clear, dentition fair Lymphatic: No  cervical supraclavicular or axillary adenopathy Lungs no rales or rhonchi, good excursion bilaterally Heart regular rate and rhythm, no murmur appreciated Abd soft, nontender, positive bowel sounds, no liver or spleen tip palpated on exam, no fluid wave MSK no focal spinal tenderness, no joint edema Neuro: non-focal, well-oriented, appropriate affect Breasts: Deferred  Lab Results  Component Value Date   WBC 5.3 08/16/2016   HGB 11.8 08/16/2016   HCT 35.6 08/16/2016   MCV 83 08/16/2016   PLT 384 08/16/2016   Lab Results  Component Value Date   FERRITIN 167 02/26/2016   IRON 79 02/26/2016   TIBC 231 (L) 02/26/2016   UIBC 152 02/26/2016   IRONPCTSAT 34 02/26/2016   Lab Results  Component Value Date   RETICCTPCT 1.4 08/06/2015   RBC 4.30 08/16/2016   RETICCTABS 73.1 08/06/2015   No results found for: KPAFRELGTCHN, LAMBDASER, KAPLAMBRATIO No results found for: IGGSERUM, IGA, IGMSERUM No results found for: Odetta Pink, SPEI   Chemistry      Component Value Date/Time   NA 143 02/26/2016 0923   K 3.7 02/26/2016 0923   CL 107 08/06/2015 0800   CL 104 07/21/2014 0940   CO2 24 02/26/2016 0923   BUN 12.6 02/26/2016 0923   CREATININE 0.9 02/26/2016 0923      Component Value Date/Time   CALCIUM 9.4 02/26/2016 0923   ALKPHOS 77 02/26/2016 0923   AST 14 02/26/2016 0923   ALT 13 02/26/2016 0923   BILITOT 0.37 02/26/2016 0923     Impression and Plan: [name] is a 70 yo African American female with essential thrombocythemia.     She continues to do well on Hydrea and her platelet count is stable at 384. She will remain on her current dose. She will also continue her daily aspirin and folic acid.  We will plan to see her back in 2 months for labs and follow-up.  She will contact our office with any questions or concerns. We can certainly see her sooner if need be.   Eliezer Bottom, NP 9/12/20171:09 PM

## 2016-08-17 ENCOUNTER — Encounter: Payer: Self-pay | Admitting: Adult Health

## 2016-08-17 ENCOUNTER — Ambulatory Visit (INDEPENDENT_AMBULATORY_CARE_PROVIDER_SITE_OTHER): Payer: Medicare Other | Admitting: Adult Health

## 2016-08-17 ENCOUNTER — Ambulatory Visit (INDEPENDENT_AMBULATORY_CARE_PROVIDER_SITE_OTHER)
Admission: RE | Admit: 2016-08-17 | Discharge: 2016-08-17 | Disposition: A | Discharge status: Home / Self Care | Payer: Medicare Other | Source: Ambulatory Visit | Attending: Adult Health | Admitting: Adult Health

## 2016-08-17 VITALS — BP 106/68 | HR 114 | Ht 62.0 in | Wt 191.0 lb

## 2016-08-17 DIAGNOSIS — J4541 Moderate persistent asthma with (acute) exacerbation: Secondary | ICD-10-CM

## 2016-08-17 DIAGNOSIS — J383 Other diseases of vocal cords: Secondary | ICD-10-CM

## 2016-08-17 LAB — IRON AND TIBC
%SAT: 28 % (ref 21–57)
IRON: 73 ug/dL (ref 41–142)
TIBC: 255 ug/dL (ref 236–444)
UIBC: 182 ug/dL (ref 120–384)

## 2016-08-17 LAB — FERRITIN: FERRITIN: 82 ng/mL (ref 9–269)

## 2016-08-17 MED ORDER — PREDNISONE 10 MG PO TABS: ORAL_TABLET | ORAL | 0 refills | Status: DC

## 2016-08-17 MED ORDER — TIOTROPIUM BROMIDE MONOHYDRATE 18 MCG IN CAPS: 18.0000 ug | ORAL_CAPSULE | Freq: Every day | RESPIRATORY_TRACT | 6 refills | Status: DC

## 2016-08-17 NOTE — Patient Instructions (Signed)
We will check a CXR on your way out today  Sleep study as scheduled  Prednisone taper - take as directed  Will add spiriva  Follow up with Dr. Lamonte Sakai in 1 month  Continue prilosec, flonase, symbicort  Please contact office for sooner follow up if symptoms do not improve or worsen or seek emergency care

## 2016-08-17 NOTE — Progress Notes (Signed)
Chief Complaint  Patient presents with  . Acute Visit    Reports SOB, wheezing, chest tightness, cough. Symptoms started over 1 month ago. Cough is non productive. Denies fever.     Tests  Past medical hx Past Medical History:  Diagnosis Date  . Allergic rhinitis   . Anemia   . Asthma   . Colonic polyp   . DDD (degenerative disc disease), lumbar 07/20/2011  . DJD (degenerative joint disease) of knee    bilateral  . DJD (degenerative joint disease), lumbar 07/20/2011  . Esophageal stricture   . GERD (gastroesophageal reflux disease)   . Hiatal hernia   . Hyperlipidemia   . Osteoporosis   . Stroke ([place])   . Vocal cord dysfunction      Past surgical hx, Allergies, Family hx, Social hx all reviewed.  Current Outpatient Prescriptions on File Prior to Visit  Medication Sig  . albuterol (PROVENTIL HFA) 108 (90 Base) MCG/ACT inhaler Inhale 2 puffs into the lungs every 4 (four) hours as needed for wheezing or shortness of breath. Reported on 12/30/2015  . albuterol (PROVENTIL) (2.5 MG/3ML) 0.083% nebulizer solution Take 2.5 mg by nebulization every 4 (four) hours as needed for shortness of breath. Reported on 12/30/2015  . aspirin EC 81 MG tablet Take 81 mg by mouth daily.  . budesonide-formoterol (SYMBICORT) 160-4.5 MCG/ACT inhaler Inhale 2 puffs into the lungs 2 (two) times daily.  . budesonide-formoterol (SYMBICORT) 160-4.5 MCG/ACT inhaler Inhale 2 puffs into the lungs 2 (two) times daily.  . cholecalciferol (VITAMIN D) 1000 UNITS tablet Take 1,000 Units by mouth daily.  . clonazePAM (KLONOPIN) 0.5 MG tablet TAKE 1 TABLET BY MOUTH AS NEEDED TWICE A DAY  . fluticasone (FLONASE) 50 MCG/ACT nasal spray Place 1 spray into the nose 2 (two) times daily.   . folic acid (FOLVITE) 1 MG tablet Take 1 tablet (1 mg total) by mouth daily.  . hydroxyurea (HYDREA) 500 MG capsule TAKE 1 CAPSULE (500 MG TOTAL) BY MOUTH 3 (THREE) TIMES DAILY.  . Magnesium 200 MG TABS Take 1 tablet by mouth  2 (two) times daily.   . NON FORMULARY Take 1 capsule by mouth every morning. BLACK COHASH  . omeprazole (PRILOSEC) 20 MG capsule TAKE 1 TABLET TWICE A DAY.  . pravastatin (PRAVACHOL) 40 MG tablet Take 40 mg by mouth daily.  . sertraline (ZOLOFT) 50 MG tablet Take 50 mg by mouth daily.   No current facility-administered medications on file prior to visit.      Vital Signs BP 106/68 (BP Location: Right Arm, Cuff Size: Normal)   Pulse (!) 114   Ht 5\' 2"  (1.575 m)   Wt 191 lb (86.6 kg)   SpO2 100%   BMI 34.93 kg/m   History of Present Illness [name] is a 70 y.o. female with hx asthma and VCD both c/b severe GERD.  Frequent exacerbations, recently seen by Dr. Lamonte Sakai 8/9 with ongoing cough.  She is currently on symbicort, BID prilosec, flonase and PRN albuterol.   Returns today for acute OV with c/o SOB, cough, wheezing x 1 month.  Feels it never completely cleared up since July when she had a flair up but has waxed and waned.  Felt a little better when she was on the pred taper (finished pred taper right after last visit in August).  Cough is non productive.  Using SABA 1-2x daily but does notice improvement when she uses it.   Denies fever, chest pain, BLE edema, orthopnea,  n/v/d, congestion, sore throat.     Physical Exam  General - pleasant female, No distress  ENT - No sinus tenderness, no oral exudate, no LAN Cardiac - s1s2 regular, no murmur Chest - resps even non labored on RA, scattered wheeze although more upper airway noise, few scattered rhonchi R>L  Back - No focal tenderness Abd - Soft, non-tender Ext - No edema Neuro - Normal strength Skin - No rashes Psych - normal mood, and behavior   Assessment/Plan  Asthma with acute exacerbation  VCD - c/b GERD and allergic rhinitis   Patient Instructions  We will check a CXR on your way out today  Sleep study as scheduled  Prednisone taper - take as directed  Will add spiriva  Follow up with Dr. Lamonte Sakai in 1 month   Continue prilosec, flonase, symbicort  Please contact office for sooner follow up if symptoms do not improve or worsen or seek emergency care     Nickolas Madrid, NP 08/17/2016  9:57 AM

## 2016-09-01 ENCOUNTER — Ambulatory Visit (HOSPITAL_BASED_OUTPATIENT_CLINIC_OR_DEPARTMENT_OTHER): Payer: Medicare Other | Attending: Emergency Medicine | Admitting: Pulmonary Disease

## 2016-09-01 VITALS — Ht 62.0 in | Wt 193.0 lb

## 2016-09-01 DIAGNOSIS — G4733 Obstructive sleep apnea (adult) (pediatric): Secondary | ICD-10-CM | POA: Diagnosis present

## 2016-09-01 DIAGNOSIS — R5383 Other fatigue: Secondary | ICD-10-CM | POA: Insufficient documentation

## 2016-09-01 DIAGNOSIS — R0683 Snoring: Secondary | ICD-10-CM | POA: Diagnosis not present

## 2016-09-01 DIAGNOSIS — I493 Ventricular premature depolarization: Secondary | ICD-10-CM | POA: Diagnosis not present

## 2016-09-01 DIAGNOSIS — G4761 Periodic limb movement disorder: Secondary | ICD-10-CM

## 2016-09-09 ENCOUNTER — Other Ambulatory Visit: Payer: Self-pay | Admitting: Emergency Medicine

## 2016-09-12 DIAGNOSIS — R0683 Snoring: Secondary | ICD-10-CM

## 2016-09-12 NOTE — Procedures (Signed)
   Patient Name: [name], [name] Date: 09/01/2016 Gender: Female D.O.B: March 22, 1946 Age (years): 84 Referring Provider: Baltazar Apo Height (inches): 11 Interpreting Physician: Chesley Mires MD, ABSM Weight (lbs): 193 RPSGT: Carolin Coy BMI: 35 MRN: [id number] Neck Size: 12.50  CLINICAL INFORMATION Sleep Study Type: NPSG Indication for sleep study: Fatigue, OSA, Snoring Epworth Sleepiness Score: 4  Most recent polysomnogram dated 12/18/2008 revealed an AHI of 0.3/h.  SLEEP STUDY TECHNIQUE As per the AASM Manual for the Scoring of Sleep and Associated Events v2.3 (April 2016) with a hypopnea requiring 4% desaturations. The channels recorded and monitored were frontal, central and occipital EEG, electrooculogram (EOG), submentalis EMG (chin), nasal and oral airflow, thoracic and abdominal wall motion, anterior tibialis EMG, snore microphone, electrocardiogram, and pulse oximetry.  MEDICATIONS Patient's medications include: reviewed in electronic medical record. Medications self-administered by patient during sleep study : No sleep medicine administered.  SLEEP ARCHITECTURE The study was initiated at 9:55:16 PM and ended at 4:43:04 AM. Sleep onset time was 49.9 minutes and the sleep efficiency was 78.2%. The total sleep time was 319.0 minutes. Stage REM latency was 82.5 minutes. The patient spent 7.05% of the night in stage N1 sleep, 75.71% in stage N2 sleep, 0.00% in stage N3 and 17.24% in REM. Alpha intrusion was absent. Supine sleep was 61.44%.  RESPIRATORY PARAMETERS The overall apnea/hypopnea index (AHI) was 3.4 per hour. There were 16 total apneas, including 1 obstructive, 13 central and 2 mixed apneas. There were 2 hypopneas and 29 RERAs. The AHI during Stage REM sleep was 2.2 per hour. AHI while supine was 4.9 per hour. The mean oxygen saturation was 95.23%. The minimum SpO2 during sleep was 92.00%. Soft snoring was noted during this study.  CARDIAC DATA The 2  lead EKG demonstrated sinus rhythm. The mean heart rate was 79.45 beats per minute. Other EKG findings include: PVCs.  LEG MOVEMENT DATA The total PLMS were 231 with a resulting PLMS index of 43.45. Associated arousal with leg movement index was 5.6 .  IMPRESSIONS - While she had several obstructive respiratory events, these were not frequent enough to qualify for diagnosis of obstructive sleep apnea.  Her AHI was 3.4 with an SpO2 low of 92%. - She had a significant increase in her periodic limb movement index.  DIAGNOSIS - Snoring (R06.83) - Periodic limb movements of sleep (G47.61)  RECOMMENDATIONS - Avoid alcohol, sedatives and other CNS depressants that may worsen sleep apnea and disrupt normal sleep architecture. - Sleep hygiene should be reviewed to assess factors that may improve sleep quality. - Weight management and regular exercise should be initiated or continued if appropriate. - Assess for presence of restless  leg syndrome  [Electronically signed] 09/12/2016 05:41 PM  Chesley Mires MD, [place], American Board of Sleep Medicine   NPI: [id number]

## 2016-09-14 MED FILL — HYDROXYUREA 500 MG CAPSULE: 500 | 30 days supply | Qty: 90 | Fill #1

## 2016-09-17 ENCOUNTER — Emergency Department (HOSPITAL_COMMUNITY): Payer: Medicare Other

## 2016-09-17 ENCOUNTER — Encounter (HOSPITAL_COMMUNITY): Payer: Self-pay | Admitting: Emergency Medicine

## 2016-09-17 DIAGNOSIS — Z8673 Personal history of transient ischemic attack (TIA), and cerebral infarction without residual deficits: Secondary | ICD-10-CM | POA: Diagnosis not present

## 2016-09-17 DIAGNOSIS — Z7982 Long term (current) use of aspirin: Secondary | ICD-10-CM | POA: Insufficient documentation

## 2016-09-17 DIAGNOSIS — J45901 Unspecified asthma with (acute) exacerbation: Secondary | ICD-10-CM | POA: Diagnosis not present

## 2016-09-17 DIAGNOSIS — Z87891 Personal history of nicotine dependence: Secondary | ICD-10-CM | POA: Insufficient documentation

## 2016-09-17 LAB — CBC WITH DIFFERENTIAL/PLATELET
BASOS ABS: 0 10*3/uL (ref 0.0–0.1)
Basophils Relative: 0 %
EOS ABS: 0.1 10*3/uL (ref 0.0–0.7)
Eosinophils Relative: 1 %
HCT: 34.8 % — ABNORMAL LOW (ref 36.0–46.0)
HEMOGLOBIN: 11.1 g/dL — AB (ref 12.0–15.0)
LYMPHS ABS: 2.2 10*3/uL (ref 0.7–4.0)
LYMPHS PCT: 49 %
MCH: 26 pg (ref 26.0–34.0)
MCHC: 31.9 g/dL (ref 30.0–36.0)
MCV: 81.5 fL (ref 78.0–100.0)
Monocytes Absolute: 0.5 10*3/uL (ref 0.1–1.0)
Monocytes Relative: 10 %
NEUTROS PCT: 40 %
Neutro Abs: 1.8 10*3/uL (ref 1.7–7.7)
PLATELETS: 425 10*3/uL — AB (ref 150–400)
RBC: 4.27 MIL/uL (ref 3.87–5.11)
RDW: 18.1 % — ABNORMAL HIGH (ref 11.5–15.5)
WBC: 4.5 10*3/uL (ref 4.0–10.5)

## 2016-09-17 LAB — COMPREHENSIVE METABOLIC PANEL
ALK PHOS: 69 U/L (ref 38–126)
ALT: 12 U/L — AB (ref 14–54)
AST: 16 U/L (ref 15–41)
Albumin: 3.4 g/dL — ABNORMAL LOW (ref 3.5–5.0)
Anion gap: 9 (ref 5–15)
BUN: 10 mg/dL (ref 6–20)
CALCIUM: 9.3 mg/dL (ref 8.9–10.3)
CHLORIDE: 105 mmol/L (ref 101–111)
CO2: 25 mmol/L (ref 22–32)
CREATININE: 0.95 mg/dL (ref 0.44–1.00)
GFR calc non Af Amer: 59 mL/min — ABNORMAL LOW (ref >60)
GLUCOSE: 95 mg/dL (ref 65–99)
Potassium: 4 mmol/L (ref 3.5–5.1)
SODIUM: 139 mmol/L (ref 135–145)
Total Bilirubin: 0.4 mg/dL (ref 0.3–1.2)
Total Protein: 6.1 g/dL — ABNORMAL LOW (ref 6.5–8.1)

## 2016-09-17 MED ORDER — ALBUTEROL SULFATE (2.5 MG/3ML) 0.083% IN NEBU
INHALATION_SOLUTION | RESPIRATORY_TRACT | Status: AC
  Filled 2016-09-17: qty 6

## 2016-09-17 MED ORDER — ALBUTEROL SULFATE (2.5 MG/3ML) 0.083% IN NEBU
5.0000 mg | INHALATION_SOLUTION | Freq: Once | RESPIRATORY_TRACT | Status: AC
  Administered 2016-09-17: 5 mg via RESPIRATORY_TRACT

## 2016-09-17 NOTE — ED Triage Notes (Signed)
Pt. reports asthma attack onset yesterday with dry cough , wheezing and chest congestion unrelieved by her inhaler.

## 2016-09-18 ENCOUNTER — Emergency Department (HOSPITAL_COMMUNITY)
Admission: EM | Admit: 2016-09-18 | Discharge: 2016-09-18 | Disposition: A | Discharge status: Home / Self Care | Payer: Medicare Other | Attending: Emergency Medicine | Admitting: Emergency Medicine

## 2016-09-18 DIAGNOSIS — J45901 Unspecified asthma with (acute) exacerbation: Secondary | ICD-10-CM

## 2016-09-18 MED ORDER — IPRATROPIUM-ALBUTEROL 0.5-2.5 (3) MG/3ML IN SOLN
3.0000 mL | Freq: Once | RESPIRATORY_TRACT | Status: AC
  Administered 2016-09-18: 3 mL via RESPIRATORY_TRACT
  Filled 2016-09-18: qty 3

## 2016-09-18 MED ORDER — PREDNISONE 10 MG PO TABS: 20.0000 mg | ORAL_TABLET | Freq: Two times a day (BID) | ORAL | 0 refills | Status: DC

## 2016-09-18 MED ORDER — METHYLPREDNISOLONE SODIUM SUCC 125 MG IJ SOLR
125.0000 mg | Freq: Once | INTRAMUSCULAR | Status: AC
  Administered 2016-09-18: 125 mg via INTRAVENOUS
  Filled 2016-09-18: qty 2

## 2016-09-18 MED ORDER — AZITHROMYCIN 250 MG PO TABS: ORAL_TABLET | ORAL | 0 refills | Status: DC

## 2016-09-18 NOTE — Discharge Instructions (Signed)
Prednisone and Zithromax as prescribed.  Continue using your albuterol nebulizer every 4 hours as needed.  Return to the ER symptoms significant worsening or change.

## 2016-09-18 NOTE — ED Provider Notes (Signed)
[facility] DEPT Provider Note   CSN: [id number] Arrival date & time: 09/17/16  2153  By signing my name below, I, Soijett Blue, attest that this documentation has been prepared under the direction and in the presence of Veryl Speak, MD. Electronically Signed: Soijett Blue, ED Scribe. 09/18/16. 3:13 AM.   History   Chief Complaint Chief Complaint  Patient presents with  . Asthma    HPI  [name] is a 70 y.o. female with a PMHx of asthma, who presents to the Emergency Department complaining of exacerbation of asthma attack onset 2 days ago. Pt notes that she doesn't have recurrent asthma exacerbations and she is typically able to control her asthma with her home medications. Pt denies ever having to be placed on steroids for her asthma. She states that she is having associated symptoms of wheezing and right ankle swelling. She states that she has tried albuterol rescue inhaler, neb treatment, with no relief for her symptoms. She denies CP, cough, fever, and any other symptoms. Denies cardiac issues.    The history is provided by the patient. No language interpreter was used.    Past Medical History:  Diagnosis Date  . Allergic rhinitis   . Anemia   . Asthma   . Colonic polyp   . DDD (degenerative disc disease), lumbar 07/20/2011  . DJD (degenerative joint disease) of knee    bilateral  . DJD (degenerative joint disease), lumbar 07/20/2011  . Esophageal stricture   . GERD (gastroesophageal reflux disease)   . Hiatal hernia   . Hyperlipidemia   . Osteoporosis   . Stroke ([place])   . Vocal cord dysfunction     Patient Active Problem List   Diagnosis Date Noted  . Arthritis of knee, degenerative 02/18/2015  . Shingles 12/19/2014  . Lung collapse   . Acute on chronic respiratory failure ([place])   . Atelectasis   . Essential thrombocythemia ([place]) 12/24/2013  . History of arthroscopy of knee 11/11/2013  . TIA (transient ischemic attack) 09/01/2012  . Chest pain  09/01/2012  . Arm numbness left 06/03/2012  . Migraine 05/31/2012  . DDD (degenerative disc disease), lumbar 07/20/2011  . DJD (degenerative joint disease), lumbar 07/20/2011  . CVA (cerebral infarction) 07/20/2011  . Acute lumbar radiculopathy 07/12/2011  . Preventative health care 07/10/2011  . TRIGGER FINGER, RIGHT MIDDLE 01/11/2011  . Other dysphagia 01/11/2011  . MENOPAUSAL DISORDER 07/05/2010  . OSTEOARTHRITIS, KNEES, BILATERAL 07/05/2010  . NEVUS 05/20/2010  . VAGINITIS 03/23/2010  . SUPERFICIAL THROMBOPHLEBITIS 03/20/2009  . FATIGUE 03/20/2009  . DYSPHAGIA UNSPECIFIED 02/16/2009  . Snoring 11/05/2008  . HERPES ZOSTER 06/30/2008  . Diabetes ([place]) 06/30/2008  . Hyperlipidemia 06/30/2008  . ESOPHAGEAL STRICTURE 06/30/2008  . FOOT PAIN, RIGHT 06/30/2008  . OSTEOPOROSIS 06/30/2008  . COLONIC POLYPS, HX OF 06/30/2008  . PANIC ATTACK 09/05/2007  . Allergic rhinitis 09/05/2007  . Vocal cord dysfunction 09/05/2007  . Intrinsic asthma 09/05/2007  . Esophageal reflux 09/05/2007    Past Surgical History:  Procedure Laterality Date  . CARPAL TUNNEL RELEASE     bilateral  . FOOT SURGERY     bilateral  . knee athroscopy  Dec.3, 2014   left knee  . TOTAL ABDOMINAL HYSTERECTOMY    . VIDEO BRONCHOSCOPY Bilateral 12/12/2014   Procedure: VIDEO BRONCHOSCOPY WITHOUT FLUORO;  Surgeon: Chesley Mires, MD;  Location[facility] ENDOSCOPY;  Service: Cardiopulmonary;  Laterality: Bilateral;    OB History    No data available       Home  Medications    Prior to Admission medications   Medication Sig Start Date End Date Taking? Authorizing Provider  albuterol (PROVENTIL HFA) 108 (90 Base) MCG/ACT inhaler Inhale 2 puffs into the lungs every 4 (four) hours as needed for wheezing or shortness of breath. Reported on 12/30/2015 03/04/16   Collene Gobble, MD  albuterol (PROVENTIL) (2.5 MG/3ML) 0.083% nebulizer solution Take 2.5 mg by nebulization every 4 (four) hours as needed for shortness of breath.  Reported on 12/30/2015 09/11/13   Collene Gobble, MD  aspirin EC 81 MG tablet Take 81 mg by mouth daily.    Historical Provider, MD  budesonide-formoterol (SYMBICORT) 160-4.5 MCG/ACT inhaler Inhale 2 puffs into the lungs 2 (two) times daily. 08/08/14   Tammy S Parrett, NP  budesonide-formoterol (SYMBICORT) 160-4.5 MCG/ACT inhaler Inhale 2 puffs into the lungs 2 (two) times daily. 07/13/16   Collene Gobble, MD  cholecalciferol (VITAMIN D) 1000 UNITS tablet Take 1,000 Units by mouth daily.    Historical Provider, MD  clonazePAM (KLONOPIN) 0.5 MG tablet TAKE 1 TABLET BY MOUTH AS NEEDED TWICE A DAY 04/22/15   Historical Provider, MD  fluticasone (FLONASE) 50 MCG/ACT nasal spray Place 1 spray into the nose 2 (two) times daily.     Historical Provider, MD  folic acid (FOLVITE) 1 MG tablet Take 1 tablet (1 mg total) by mouth daily. 06/03/15   Eliezer Bottom, NP  hydroxyurea (HYDREA) 500 MG capsule TAKE 1 CAPSULE (500 MG TOTAL) BY MOUTH 3 (THREE) TIMES DAILY. 07/29/16   [name], MD  Magnesium 200 MG TABS Take 1 tablet by mouth 2 (two) times daily.     Historical Provider, MD  NON FORMULARY Take 1 capsule by mouth every morning. [place]    Historical Provider, MD  omeprazole (PRILOSEC) 20 MG capsule TAKE 1 TABLET TWICE A DAY. 09/13/16   Collene Gobble, MD  pravastatin (PRAVACHOL) 40 MG tablet Take 40 mg by mouth daily.    Historical Provider, MD  predniSONE (DELTASONE) 10 MG tablet 4 tabs daily x 2 days then 3 tabs daily x 2 days then 2 tabs daily x 2 days then 1 tab daily x 2 days then stop. 08/17/16   Marijean Heath, NP  sertraline (ZOLOFT) 50 MG tablet Take 50 mg by mouth daily.    Historical Provider, MD  tiotropium (SPIRIVA) 18 MCG inhalation capsule Place 1 capsule (18 mcg total) into inhaler and inhale daily. 08/17/16   Marijean Heath, NP    Family History Family History  Problem Relation Age of Onset  . Heart attack Mother   . Colon cancer Father 9  . Cancer Sister   .  Asthma Brother   . Asthma      uncle  . Stomach cancer Neg Hx   . Esophageal cancer Neg Hx   . Rectal cancer Neg Hx     Social History Social History  Substance Use Topics  . Smoking status: Former Smoker    Packs/day: 1.00    Years: 20.00    Types: Cigarettes    Start date: 01/31/1975    Quit date: 08/09/1995  . Smokeless tobacco: Never Used     Comment: quit 18 years ago.  . Alcohol use No     Allergies   Penicillins   Review of Systems Review of Systems  All other systems reviewed and are negative.    Physical Exam Updated Vital Signs BP 145/62 (BP Location: Right Arm)   Pulse 73  Temp 98.3 F (36.8 C) (Oral)   Resp 18   SpO2 98%   Physical Exam  Constitutional: She is oriented to person, place, and time. She appears well-developed and well-nourished. No distress.  HENT:  Head: Normocephalic and atraumatic.  Right Ear: Hearing normal.  Left Ear: Hearing normal.  Nose: Nose normal.  Mouth/Throat: Oropharynx is clear and moist and mucous membranes are normal.  Eyes: Conjunctivae and EOM are normal. Pupils are equal, round, and reactive to light.  Neck: Normal range of motion. Neck supple.  Cardiovascular: Normal rate, regular rhythm, S1 normal, S2 normal and normal heart sounds.  Exam reveals no gallop and no friction rub.   No murmur heard. Pulmonary/Chest: Effort normal. No respiratory distress. She has wheezes. She has no rales. She exhibits no tenderness.  Expiratory bilaterally. No respiratory distress.   Abdominal: Soft. Normal appearance and bowel sounds are normal. There is no hepatosplenomegaly. There is no tenderness. There is no rebound, no guarding, no tenderness at McBurney's point and negative Murphy's sign. No hernia.  Musculoskeletal: Normal range of motion.  Neurological: She is alert and oriented to person, place, and time. She has normal strength. No cranial nerve deficit or sensory deficit. Coordination normal. GCS eye subscore is 4. GCS  verbal subscore is 5. GCS motor subscore is 6.  Skin: Skin is warm, dry and intact. No rash noted. No cyanosis.  Psychiatric: She has a normal mood and affect. Her speech is normal and behavior is normal. Thought content normal.  Nursing note and vitals reviewed.    ED Treatments / Results  DIAGNOSTIC STUDIES: Oxygen Saturation is 98% on RA, nl by my interpretation.    COORDINATION OF CARE: 3:09 AM Discussed treatment plan with pt at bedside which includes labs, CXR, solu-medrol, breathing treatment, prednisone Rx, and pt agreed to plan.   Labs (all labs ordered are listed, but only abnormal results are displayed) Labs Reviewed  CBC WITH DIFFERENTIAL/PLATELET - Abnormal; Notable for the following:       Result Value   Hemoglobin 11.1 (*)    HCT 34.8 (*)    RDW 18.1 (*)    Platelets 425 (*)    All other components within normal limits  COMPREHENSIVE METABOLIC PANEL - Abnormal; Notable for the following:    Total Protein 6.1 (*)    Albumin 3.4 (*)    ALT 12 (*)    GFR calc non Af Amer 59 (*)    All other components within normal limits    EKG  EKG Interpretation None       Radiology Dg Chest 2 View  Result Date: 09/17/2016 CLINICAL DATA:  Cough and shortness of breath for 2 days. EXAM: CHEST  2 VIEW COMPARISON:  08/17/2016 and prior chest radiographs FINDINGS: The cardiomediastinal silhouette is unremarkable. Mild interstitial prominence is unchanged. There is no evidence of focal airspace disease, pulmonary edema, suspicious pulmonary nodule/mass, pleural effusion, or pneumothorax. No acute bony abnormalities are identified. IMPRESSION: No evidence of acute cardiopulmonary disease. Electronically Signed   By: Margarette Canada M.D.   On: 09/17/2016 23:14    Procedures Procedures (including critical care time)  Medications Ordered in ED Medications  albuterol (PROVENTIL) (2.5 MG/3ML) 0.083% nebulizer solution 5 mg (5 mg Nebulization Given 09/17/16 2206)     Initial  Impression / Assessment and Plan / ED Course  I have reviewed the triage vital signs and the nursing notes.  Pertinent labs & imaging results that were available during my care of the patient  were reviewed by me and considered in my medical decision making (see chart for details).  Clinical Course    Patient presents with complaints of shortness of breath. She is wheezing on physical exam and has a history of asthma. She was given breathing treatments along with IM steroids and is feeling better. She will be discharged with antibiotics, prednisone, and continued use of her nebulizer.  Final Clinical Impressions(s) / ED Diagnoses   Final diagnoses:  None    New Prescriptions New Prescriptions   No medications on file   I personally performed the services described in this documentation, which was scribed in my presence. The recorded information has been reviewed and is accurate.        Veryl Speak, MD 09/18/16 [phone]

## 2016-10-13 ENCOUNTER — Encounter: Payer: Self-pay | Admitting: Emergency Medicine

## 2016-10-13 ENCOUNTER — Ambulatory Visit (INDEPENDENT_AMBULATORY_CARE_PROVIDER_SITE_OTHER): Payer: Medicare Other | Admitting: Emergency Medicine

## 2016-10-13 DIAGNOSIS — G2581 Restless legs syndrome: Secondary | ICD-10-CM | POA: Diagnosis not present

## 2016-10-13 DIAGNOSIS — J383 Other diseases of vocal cords: Secondary | ICD-10-CM

## 2016-10-13 MED ORDER — ROPINIROLE HCL 1 MG PO TABS: ORAL_TABLET | ORAL | 0 refills | Status: DC

## 2016-10-13 NOTE — Assessment & Plan Note (Signed)
We will do a trial of Requip. Follow in 2 months to see if she benefits

## 2016-10-13 NOTE — Assessment & Plan Note (Signed)
Stable UA noise on exam today. Sx are labile, usually require streoids when exacerbated. Continue current regimen. Will hold off on Spiriva - difficult to know whether it helped her vs the steroids that she received at the same time.

## 2016-10-13 NOTE — Progress Notes (Signed)
  Subjective:    Patient ID: [name], female    DOB: [birth year]   MRN: [id number] HPI 70 -year-old woman with a history of asthma and vocal cord dysfunction both of which have been significantly impacted by severe esophageal reflux. Has a hx esophageal stricture dilations by Dr Henrene Pastor. On omeprazole 20mg  two times a day. Uses Symbicort bid.     ROV 07/13/16 -- On visit for patient with vocal cord dysfunction, upper airway irritation syndrome, and superimposed asthma. She has GERD and allergic rhinitis which exacerbate all of the above. I saw her 06/29/16, treated her with a prednisone taper, continued her Flonase, omeprazole. She had an IgE that was normal at 11, Eosinophil count 0.1.  She remains SOB w exertion, her cough may be a bit better since the prednisone.  She remains on symbicort.  She does not sleep well, she believes that she snores.  Her weight has gone up with the pred use.   ROV 10/13/16 -- she has a history of asthma with coexisting severe vocal cord dysfunction and upper airway irritation syndrome. All of these exacerbated by allergic rhinitis and esophageal reflux. She was seen here in September for exacerbation and treated with prednisone taper, started on Spiriva in addition to her existing Symbicort. A sleep study was done on 09/01/16 that showed all number of obstructive events with an AHI of 3.4. She also had an increase in her periodic limb movements. She was then seen in the emergency department 09/18/16 for increased dyspnea, increased wheeze and SABA use. She improved w steroids, abx. Currently feels that she is breathing at baseline. Cough occasionally. She is using her SABA about bid. She feels that she benefited from Spiriva, has run out it. She does feels like she has some discomfort and needs to move her legs.   Current GERD regimen > omeprazole 20 mg daily, Current allergfy regimen > Flonase  ROS:  As per HPI.    Objective:   Physical Exam Vitals:   10/13/16  1205 10/13/16 1206  BP:  110/74  BP Location:  Left Arm  Cuff Size:  Normal  Pulse:  93  SpO2:  97%  Weight: 86.2 kg (190 lb)   Height: 5\' 2"  (1.575 m)    Gen: Pleasant, overwt, in no distress,  normal affect,   ENT: No lesions,  mouth clear,  oropharynx clear, no postnasal drip  Neck: supple no JVD, soft insp and exp stridor  Lungs : Decreased BS w/ no wheezing   Cardiovascular: RRR, heart sounds normal, no murmur or gallops, no peripheral edema  Musculoskeletal: No deformities, no cyanosis or clubbing  Neuro: alert, non focal  Skin: Warm,     Assessment & Plan:   Restless leg syndrome We will do a trial of Requip. Follow in 2 months to see if she benefits  Vocal cord dysfunction Stable UA noise on exam today. Sx are labile, usually require streoids when exacerbated. Continue current regimen. Will hold off on Spiriva - difficult to know whether it helped her vs the steroids that she received at the same time.   Baltazar Apo, MD, PhD 10/13/2016, 12:36 PM [facility] [phone] or if no answer [phone]

## 2016-10-13 NOTE — Patient Instructions (Signed)
Please continue your current medications as you have been taking them We will not restart Spiriva at this time We will start Requip 0.5mg  every evening for 3 days, then increase to 1mg  every evening for 3 nights, then go to 1.5mg  every evening and stay on that dose.  Follow with Dr Lamonte Sakai in 2 months or sooner if you have any problems.

## 2016-10-18 ENCOUNTER — Ambulatory Visit (HOSPITAL_BASED_OUTPATIENT_CLINIC_OR_DEPARTMENT_OTHER): Payer: Medicare Other | Admitting: Family

## 2016-10-18 ENCOUNTER — Other Ambulatory Visit (HOSPITAL_BASED_OUTPATIENT_CLINIC_OR_DEPARTMENT_OTHER): Payer: Medicare Other

## 2016-10-18 VITALS — BP 110/61 | HR 83 | Temp 98.4°F | Resp 16 | Wt 186.0 lb

## 2016-10-18 DIAGNOSIS — D573 Sickle-cell trait: Secondary | ICD-10-CM

## 2016-10-18 DIAGNOSIS — D473 Essential (hemorrhagic) thrombocythemia: Secondary | ICD-10-CM | POA: Diagnosis not present

## 2016-10-18 LAB — FERRITIN: Ferritin: 134 ng/ml (ref 9–269)

## 2016-10-18 LAB — CBC WITH DIFFERENTIAL (CANCER CENTER ONLY)
BASO#: 0 10*3/uL (ref 0.0–0.2)
BASO%: 0.8 % (ref 0.0–2.0)
EOS%: 1.5 % (ref 0.0–7.0)
Eosinophils Absolute: 0.1 10*3/uL (ref 0.0–0.5)
HCT: 34 % — ABNORMAL LOW (ref 34.8–46.6)
HGB: 11.3 g/dL — ABNORMAL LOW (ref 11.6–15.9)
LYMPH#: 1.5 10*3/uL (ref 0.9–3.3)
LYMPH%: 37.7 % (ref 14.0–48.0)
MCH: 27.8 pg (ref 26.0–34.0)
MCHC: 33.2 g/dL (ref 32.0–36.0)
MCV: 84 fL (ref 81–101)
MONO#: 0.4 10*3/uL (ref 0.1–0.9)
MONO%: 9.4 % (ref 0.0–13.0)
NEUT#: 2 10*3/uL (ref 1.5–6.5)
NEUT%: 50.6 % (ref 39.6–80.0)
Platelets: 459 10*3/uL — ABNORMAL HIGH (ref 145–400)
RBC: 4.06 10*6/uL (ref 3.70–5.32)
RDW: 18.9 % — AB (ref 11.1–15.7)
WBC: 3.9 10*3/uL (ref 3.9–10.0)

## 2016-10-18 LAB — IRON AND TIBC
%SAT: 40 % (ref 21–57)
IRON: 102 ug/dL (ref 41–142)
TIBC: 254 ug/dL (ref 236–444)
UIBC: 152 ug/dL (ref 120–384)

## 2016-10-18 LAB — COMPREHENSIVE METABOLIC PANEL
ALT: 12 U/L (ref 0–55)
AST: 16 U/L (ref 5–34)
Albumin: 3.5 g/dL (ref 3.5–5.0)
Alkaline Phosphatase: 90 U/L (ref 40–150)
Anion Gap: 11 mEq/L (ref 3–11)
BUN: 13.5 mg/dL (ref 7.0–26.0)
CALCIUM: 9.7 mg/dL (ref 8.4–10.4)
CHLORIDE: 108 meq/L (ref 98–109)
CO2: 23 mEq/L (ref 22–29)
Creatinine: 0.9 mg/dL (ref 0.6–1.1)
EGFR: 80 mL/min/{1.73_m2} — AB (ref >90)
GLUCOSE: 82 mg/dL (ref 70–140)
POTASSIUM: 4.1 meq/L (ref 3.5–5.1)
SODIUM: 142 meq/L (ref 136–145)
Total Bilirubin: 0.64 mg/dL (ref 0.20–1.20)
Total Protein: 7.1 g/dL (ref 6.4–8.3)

## 2016-10-18 LAB — CHCC SATELLITE - SMEAR

## 2016-10-18 MED FILL — HYDROXYUREA 500 MG CAPSULE: 500 | 30 days supply | Qty: 90 | Fill #2

## 2016-10-18 NOTE — Progress Notes (Signed)
Hematology and Oncology Follow Up Visit  [name] [id number] 03-16-1946 70 y.o. 10/18/2016   Principle Diagnosis:  Essential thrombocythemia-JAK2 POSITIVE Sickle cell trait  Current Therapy:   Hydrea 500 mg by mouth TID Folic acid 1 mg by mouth daily Aspirin 81 mg daily    Interim History:  [name] is here today for a follow-up. She is doing well. She verbalized that she is taking her Hydrea 500 mg PO TID as prescribed. She is also taking her folic acid and aspirin daily.  Her platelet count is slightly elevated at 459. Hgb is stable at 11.3 and WBC count is 3.9.  No fever, chills, n/v, cough, rash, dizziness, headache, vision changes, chest pain, palpitations, abdominal pain or changes in bowel or bladder habits.  She states that she did have an episode several weeks ago where she "blacked out." She does not remember what happened and woke up on the floor. Thankfully, she was not injured. She has had no recurrent episodes.  She follows up with her PCP later this week and plans to let them know about this.  No lymphadenopathy found on exam. No episodes of bleeding or bruising.  No swelling, numbness or tingling in her extremities. She still has arthritic pain in her shoulders and ankles/legs. No changes.  She has maintained a good appetite and is staying well hydrated. Her weight is stable.   Medications:    Medication List       Accurate as of 10/18/16 11:17 AM. Always use your most recent med list.          albuterol (2.5 MG/3ML) 0.083% nebulizer solution Commonly known as:  PROVENTIL Take 2.5 mg by nebulization every 4 (four) hours as needed for shortness of breath. Reported on 12/30/2015   albuterol 108 (90 Base) MCG/ACT inhaler Commonly known as:  PROVENTIL HFA Inhale 2 puffs into the lungs every 4 (four) hours as needed for wheezing or shortness of breath. Reported on 12/30/2015   aspirin EC 81 MG tablet Take 81 mg by mouth daily.   budesonide-formoterol  160-4.5 MCG/ACT inhaler Commonly known as:  SYMBICORT Inhale 2 puffs into the lungs 2 (two) times daily.   cholecalciferol 1000 units tablet Commonly known as:  VITAMIN D Take 1,000 Units by mouth daily.   clonazePAM 0.5 MG tablet Commonly known as:  KLONOPIN TAKE 1 TABLET BY MOUTH AS NEEDED TWICE A DAY   fluticasone 50 MCG/ACT nasal spray Commonly known as:  FLONASE Place 1 spray into the nose 2 (two) times daily.   folic acid 1 MG tablet Commonly known as:  FOLVITE Take 1 tablet (1 mg total) by mouth daily.   hydroxyurea 500 MG capsule Commonly known as:  HYDREA TAKE 1 CAPSULE (500 MG TOTAL) BY MOUTH 3 (THREE) TIMES DAILY.   Magnesium 200 MG Tabs Take 1 tablet by mouth 2 (two) times daily.   NON FORMULARY Take 1 capsule by mouth every morning. BLACK COHASH   omeprazole 20 MG capsule Commonly known as:  PRILOSEC TAKE 1 TABLET TWICE A DAY.   pravastatin 40 MG tablet Commonly known as:  PRAVACHOL Take 40 mg by mouth daily.   rOPINIRole 1 MG tablet Commonly known as:  REQUIP Take 0.5 tablet for 3 evenings, then increase to 1 tab for 3 evenings, then increase to 1.5 tabs every evening and stay at that dose   sertraline 50 MG tablet Commonly known as:  ZOLOFT Take 50 mg by mouth daily.   tiotropium 18 MCG inhalation  capsule Commonly known as:  SPIRIVA Place 1 capsule (18 mcg total) into inhaler and inhale daily.       Allergies:  Allergies  Allergen Reactions  . Penicillins Itching and Rash    Past Medical History, Surgical history, Social history, and Family History were reviewed and updated.  Review of Systems: All other 10 point review of systems is negative.   Physical Exam:  vitals were not taken for this visit.  Wt Readings from Last 3 Encounters:  10/13/16 190 lb (86.2 kg)  09/01/16 193 lb (87.5 kg)  08/17/16 191 lb (86.6 kg)    Ocular: Sclerae unicteric, pupils equal, round and reactive to light Ear-nose-throat: Oropharynx clear, dentition  fair Lymphatic: No cervical supraclavicular or axillary adenopathy Lungs no rales or rhonchi, good excursion bilaterally Heart regular rate and rhythm, no murmur appreciated Abd soft, nontender, positive bowel sounds, no liver or spleen tip palpated on exam, no fluid wave MSK no focal spinal tenderness, no joint edema Neuro: non-focal, well-oriented, appropriate affect Breasts: Deferred  Lab Results  Component Value Date   WBC 4.5 09/17/2016   HGB 11.1 (L) 09/17/2016   HCT 34.8 (L) 09/17/2016   MCV 81.5 09/17/2016   PLT 425 (H) 09/17/2016   Lab Results  Component Value Date   FERRITIN 82 08/16/2016   IRON 73 08/16/2016   TIBC 255 08/16/2016   UIBC 182 08/16/2016   IRONPCTSAT 28 08/16/2016   Lab Results  Component Value Date   RETICCTPCT 1.4 08/06/2015   RBC 4.27 09/17/2016   RETICCTABS 73.1 08/06/2015   No results found for: KPAFRELGTCHN, LAMBDASER, KAPLAMBRATIO No results found for: IGGSERUM, IGA, IGMSERUM No results found for: Kathrynn Ducking, MSPIKE, SPEI   Chemistry      Component Value Date/Time   NA 139 09/17/2016 2211   NA 142 08/16/2016 1254   K 4.0 09/17/2016 2211   K 3.9 08/16/2016 1254   CL 105 09/17/2016 2211   CL 104 07/21/2014 0940   CO2 25 09/17/2016 2211   CO2 24 08/16/2016 1254   BUN 10 09/17/2016 2211   BUN 10.1 08/16/2016 1254   CREATININE 0.95 09/17/2016 2211   CREATININE 0.9 08/16/2016 1254      Component Value Date/Time   CALCIUM 9.3 09/17/2016 2211   CALCIUM 9.1 08/16/2016 1254   ALKPHOS 69 09/17/2016 2211   ALKPHOS 80 08/16/2016 1254   AST 16 09/17/2016 2211   AST 19 08/16/2016 1254   ALT 12 (L) 09/17/2016 2211   ALT 18 08/16/2016 1254   BILITOT 0.4 09/17/2016 2211   BILITOT 0.37 08/16/2016 1254     Impression and Plan: [name] is a 70 yo African American female with essential thrombocythemia. She has tolerated treatment with Hydrea nicely. Her platelet count is up slightly at 459. I  discussed this with Dr. Marin Olp and we will leave her on her same regimen for now. We will see how things look with her next lab.  She will continue to take her folic acid and aspirin daily.  We will plan to see her back in 6 weeks for repeat lab work and follow-up. She will contact our office with any questions or concerns. We can certainly see her sooner if need be.   Eliezer Bottom, NP 11/14/201711:17 AM

## 2016-10-19 LAB — RETICULOCYTES: RETICULOCYTE COUNT: 1.8 % (ref 0.6–2.6)

## 2016-11-24 ENCOUNTER — Observation Stay (HOSPITAL_COMMUNITY): Payer: Medicare Other

## 2016-11-24 ENCOUNTER — Encounter (HOSPITAL_COMMUNITY): Payer: Self-pay

## 2016-11-24 ENCOUNTER — Observation Stay (HOSPITAL_COMMUNITY)
Admission: EM | Admit: 2016-11-24 | Discharge: 2016-11-25 | Disposition: A | Discharge status: Home / Self Care | Payer: Medicare Other | Attending: Internal Medicine | Admitting: Internal Medicine

## 2016-11-24 ENCOUNTER — Emergency Department (HOSPITAL_COMMUNITY): Payer: Medicare Other

## 2016-11-24 DIAGNOSIS — F329 Major depressive disorder, single episode, unspecified: Secondary | ICD-10-CM | POA: Insufficient documentation

## 2016-11-24 DIAGNOSIS — J45901 Unspecified asthma with (acute) exacerbation: Secondary | ICD-10-CM | POA: Diagnosis not present

## 2016-11-24 DIAGNOSIS — G2581 Restless legs syndrome: Secondary | ICD-10-CM

## 2016-11-24 DIAGNOSIS — Z7982 Long term (current) use of aspirin: Secondary | ICD-10-CM | POA: Insufficient documentation

## 2016-11-24 DIAGNOSIS — F419 Anxiety disorder, unspecified: Secondary | ICD-10-CM | POA: Insufficient documentation

## 2016-11-24 DIAGNOSIS — R000 Tachycardia, unspecified: Secondary | ICD-10-CM | POA: Diagnosis not present

## 2016-11-24 DIAGNOSIS — J101 Influenza due to other identified influenza virus with other respiratory manifestations: Secondary | ICD-10-CM | POA: Diagnosis not present

## 2016-11-24 DIAGNOSIS — Z8673 Personal history of transient ischemic attack (TIA), and cerebral infarction without residual deficits: Secondary | ICD-10-CM | POA: Insufficient documentation

## 2016-11-24 DIAGNOSIS — Z7951 Long term (current) use of inhaled steroids: Secondary | ICD-10-CM | POA: Insufficient documentation

## 2016-11-24 DIAGNOSIS — R062 Wheezing: Secondary | ICD-10-CM | POA: Diagnosis present

## 2016-11-24 DIAGNOSIS — R0603 Acute respiratory distress: Secondary | ICD-10-CM

## 2016-11-24 DIAGNOSIS — M25519 Pain in unspecified shoulder: Secondary | ICD-10-CM

## 2016-11-24 DIAGNOSIS — M19011 Primary osteoarthritis, right shoulder: Secondary | ICD-10-CM | POA: Insufficient documentation

## 2016-11-24 DIAGNOSIS — D473 Essential (hemorrhagic) thrombocythemia: Secondary | ICD-10-CM | POA: Diagnosis present

## 2016-11-24 DIAGNOSIS — N289 Disorder of kidney and ureter, unspecified: Secondary | ICD-10-CM

## 2016-11-24 DIAGNOSIS — N179 Acute kidney failure, unspecified: Secondary | ICD-10-CM | POA: Diagnosis not present

## 2016-11-24 DIAGNOSIS — Z79899 Other long term (current) drug therapy: Secondary | ICD-10-CM | POA: Insufficient documentation

## 2016-11-24 DIAGNOSIS — E785 Hyperlipidemia, unspecified: Secondary | ICD-10-CM | POA: Diagnosis not present

## 2016-11-24 DIAGNOSIS — K210 Gastro-esophageal reflux disease with esophagitis: Secondary | ICD-10-CM | POA: Diagnosis not present

## 2016-11-24 DIAGNOSIS — J4521 Mild intermittent asthma with (acute) exacerbation: Secondary | ICD-10-CM

## 2016-11-24 DIAGNOSIS — Z87891 Personal history of nicotine dependence: Secondary | ICD-10-CM | POA: Insufficient documentation

## 2016-11-24 DIAGNOSIS — M25511 Pain in right shoulder: Secondary | ICD-10-CM

## 2016-11-24 DIAGNOSIS — E119 Type 2 diabetes mellitus without complications: Secondary | ICD-10-CM | POA: Insufficient documentation

## 2016-11-24 HISTORY — DX: Unspecified chronic bronchitis: J42

## 2016-11-24 LAB — BASIC METABOLIC PANEL
Anion gap: 11 (ref 5–15)
BUN: 9 mg/dL (ref 6–20)
CHLORIDE: 106 mmol/L (ref 101–111)
CO2: 19 mmol/L — ABNORMAL LOW (ref 22–32)
CREATININE: 1.11 mg/dL — AB (ref 0.44–1.00)
Calcium: 9.2 mg/dL (ref 8.9–10.3)
GFR, EST AFRICAN AMERICAN: 57 mL/min — AB (ref >60)
GFR, EST NON AFRICAN AMERICAN: 49 mL/min — AB (ref >60)
Glucose, Bld: 125 mg/dL — ABNORMAL HIGH (ref 65–99)
POTASSIUM: 5.1 mmol/L (ref 3.5–5.1)
SODIUM: 136 mmol/L (ref 135–145)

## 2016-11-24 LAB — CBC WITH DIFFERENTIAL/PLATELET
Basophils Absolute: 0 10*3/uL (ref 0.0–0.1)
Basophils Relative: 0 %
EOS PCT: 0 %
Eosinophils Absolute: 0 10*3/uL (ref 0.0–0.7)
HEMATOCRIT: 36.5 % (ref 36.0–46.0)
Hemoglobin: 12 g/dL (ref 12.0–15.0)
LYMPHS ABS: 0.8 10*3/uL (ref 0.7–4.0)
LYMPHS PCT: 18 %
MCH: 27.6 pg (ref 26.0–34.0)
MCHC: 32.9 g/dL (ref 30.0–36.0)
MCV: 83.9 fL (ref 78.0–100.0)
MONO ABS: 0.2 10*3/uL (ref 0.1–1.0)
MONOS PCT: 4 %
Neutro Abs: 3.7 10*3/uL (ref 1.7–7.7)
Neutrophils Relative %: 79 %
PLATELETS: 340 10*3/uL (ref 150–400)
RBC: 4.35 MIL/uL (ref 3.87–5.11)
RDW: 19.7 % — AB (ref 11.5–15.5)
WBC: 4.7 10*3/uL (ref 4.0–10.5)

## 2016-11-24 LAB — I-STAT TROPONIN, ED: TROPONIN I, POC: 0 ng/mL (ref 0.00–0.08)

## 2016-11-24 LAB — BRAIN NATRIURETIC PEPTIDE: B Natriuretic Peptide: 12.8 pg/mL (ref 0.0–100.0)

## 2016-11-24 MED ORDER — ACETAMINOPHEN 325 MG PO TABS
650.0000 mg | ORAL_TABLET | Freq: Four times a day (QID) | ORAL | Status: DC | PRN
  Administered 2016-11-24: 650 mg via ORAL
  Filled 2016-11-24: qty 2

## 2016-11-24 MED ORDER — LEVALBUTEROL HCL 1.25 MG/0.5ML IN NEBU
1.2500 mg | INHALATION_SOLUTION | Freq: Four times a day (QID) | RESPIRATORY_TRACT | Status: DC
  Administered 2016-11-24: 1.25 mg via RESPIRATORY_TRACT
  Filled 2016-11-24 (×2): qty 0.5

## 2016-11-24 MED ORDER — SODIUM CHLORIDE 0.9 % IV SOLN: 250.0000 mL | INTRAVENOUS | Status: DC | PRN

## 2016-11-24 MED ORDER — IPRATROPIUM BROMIDE 0.02 % IN SOLN
0.5000 mg | Freq: Once | RESPIRATORY_TRACT | Status: AC
  Administered 2016-11-24: 0.5 mg via RESPIRATORY_TRACT
  Filled 2016-11-24: qty 2.5

## 2016-11-24 MED ORDER — PREDNISONE 50 MG PO TABS
50.0000 mg | ORAL_TABLET | Freq: Two times a day (BID) | ORAL | Status: DC
  Administered 2016-11-24 – 2016-11-25 (×2): 50 mg via ORAL
  Filled 2016-11-24 (×2): qty 1

## 2016-11-24 MED ORDER — MOMETASONE FURO-FORMOTEROL FUM 200-5 MCG/ACT IN AERO
2.0000 | INHALATION_SPRAY | Freq: Two times a day (BID) | RESPIRATORY_TRACT | Status: DC
  Administered 2016-11-24 – 2016-11-25 (×2): 2 via RESPIRATORY_TRACT
  Filled 2016-11-24: qty 8.8

## 2016-11-24 MED ORDER — PANTOPRAZOLE SODIUM 40 MG PO TBEC
40.0000 mg | DELAYED_RELEASE_TABLET | Freq: Every day | ORAL | Status: DC
  Administered 2016-11-24 – 2016-11-25 (×2): 40 mg via ORAL
  Filled 2016-11-24 (×2): qty 1

## 2016-11-24 MED ORDER — TRAMADOL HCL 50 MG PO TABS: 50.0000 mg | ORAL_TABLET | Freq: Four times a day (QID) | ORAL | Status: DC | PRN

## 2016-11-24 MED ORDER — ACETAMINOPHEN 650 MG RE SUPP: 650.0000 mg | Freq: Four times a day (QID) | RECTAL | Status: DC | PRN

## 2016-11-24 MED ORDER — MAGNESIUM SULFATE 2 GM/50ML IV SOLN
2.0000 g | Freq: Once | INTRAVENOUS | Status: AC
  Administered 2016-11-24: 2 g via INTRAVENOUS
  Filled 2016-11-24: qty 50

## 2016-11-24 MED ORDER — SERTRALINE HCL 50 MG PO TABS
50.0000 mg | ORAL_TABLET | Freq: Every day | ORAL | Status: DC
  Administered 2016-11-24 – 2016-11-25 (×2): 50 mg via ORAL
  Filled 2016-11-24 (×2): qty 1

## 2016-11-24 MED ORDER — ALBUTEROL (5 MG/ML) CONTINUOUS INHALATION SOLN
15.0000 mg/h | INHALATION_SOLUTION | Freq: Once | RESPIRATORY_TRACT | Status: AC
  Administered 2016-11-24: 15 mg/h via RESPIRATORY_TRACT
  Filled 2016-11-24: qty 20

## 2016-11-24 MED ORDER — ALBUTEROL SULFATE (2.5 MG/3ML) 0.083% IN NEBU
5.0000 mg | INHALATION_SOLUTION | Freq: Once | RESPIRATORY_TRACT | Status: AC
  Administered 2016-11-24: 5 mg via RESPIRATORY_TRACT

## 2016-11-24 MED ORDER — ONDANSETRON HCL 4 MG PO TABS: 4.0000 mg | ORAL_TABLET | Freq: Four times a day (QID) | ORAL | Status: DC | PRN

## 2016-11-24 MED ORDER — PNEUMOCOCCAL VAC POLYVALENT 25 MCG/0.5ML IJ INJ: 0.5000 mL | INJECTION | INTRAMUSCULAR | Status: DC

## 2016-11-24 MED ORDER — BENZONATATE 100 MG PO CAPS
100.0000 mg | ORAL_CAPSULE | Freq: Once | ORAL | Status: AC
  Administered 2016-11-24: 100 mg via ORAL
  Filled 2016-11-24: qty 1

## 2016-11-24 MED ORDER — PRAVASTATIN SODIUM 40 MG PO TABS
40.0000 mg | ORAL_TABLET | Freq: Every day | ORAL | Status: DC
  Administered 2016-11-24 – 2016-11-25 (×2): 40 mg via ORAL
  Filled 2016-11-24 (×2): qty 1

## 2016-11-24 MED ORDER — SODIUM CHLORIDE 0.9 % IV BOLUS (SEPSIS)
1000.0000 mL | Freq: Once | INTRAVENOUS | Status: AC
  Administered 2016-11-24: 1000 mL via INTRAVENOUS

## 2016-11-24 MED ORDER — TIOTROPIUM BROMIDE MONOHYDRATE 18 MCG IN CAPS
18.0000 ug | ORAL_CAPSULE | Freq: Every day | RESPIRATORY_TRACT | Status: DC
  Administered 2016-11-25: 18 ug via RESPIRATORY_TRACT
  Filled 2016-11-24: qty 5

## 2016-11-24 MED ORDER — MAGNESIUM OXIDE 400 (241.3 MG) MG PO TABS
200.0000 mg | ORAL_TABLET | Freq: Two times a day (BID) | ORAL | Status: DC
  Administered 2016-11-24 – 2016-11-25 (×2): 200 mg via ORAL
  Filled 2016-11-24 (×2): qty 1

## 2016-11-24 MED ORDER — ALBUTEROL SULFATE (2.5 MG/3ML) 0.083% IN NEBU: 2.5000 mg | INHALATION_SOLUTION | RESPIRATORY_TRACT | Status: DC | PRN

## 2016-11-24 MED ORDER — ONDANSETRON HCL 4 MG/2ML IJ SOLN: 4.0000 mg | Freq: Four times a day (QID) | INTRAMUSCULAR | Status: DC | PRN

## 2016-11-24 MED ORDER — ENOXAPARIN SODIUM 40 MG/0.4ML ~~LOC~~ SOLN
40.0000 mg | SUBCUTANEOUS | Status: DC
  Administered 2016-11-24: 40 mg via SUBCUTANEOUS
  Filled 2016-11-24: qty 0.4

## 2016-11-24 MED ORDER — SODIUM CHLORIDE 0.9% FLUSH
3.0000 mL | Freq: Two times a day (BID) | INTRAVENOUS | Status: DC
  Administered 2016-11-24 – 2016-11-25 (×3): 3 mL via INTRAVENOUS

## 2016-11-24 MED ORDER — ALBUTEROL SULFATE (2.5 MG/3ML) 0.083% IN NEBU
INHALATION_SOLUTION | RESPIRATORY_TRACT | Status: AC
  Filled 2016-11-24: qty 6

## 2016-11-24 MED ORDER — SODIUM CHLORIDE 0.9% FLUSH: 3.0000 mL | INTRAVENOUS | Status: DC | PRN

## 2016-11-24 MED ORDER — CLONAZEPAM 0.5 MG PO TABS: 0.5000 mg | ORAL_TABLET | Freq: Two times a day (BID) | ORAL | Status: DC | PRN

## 2016-11-24 MED ORDER — HYDROXYUREA 500 MG PO CAPS
500.0000 mg | ORAL_CAPSULE | Freq: Three times a day (TID) | ORAL | Status: DC
  Administered 2016-11-24 – 2016-11-25 (×3): 500 mg via ORAL
  Filled 2016-11-24 (×3): qty 1

## 2016-11-24 MED ORDER — METHYLPREDNISOLONE SODIUM SUCC 125 MG IJ SOLR
125.0000 mg | Freq: Once | INTRAMUSCULAR | Status: AC
  Administered 2016-11-24: 125 mg via INTRAVENOUS
  Filled 2016-11-24: qty 2

## 2016-11-24 MED ORDER — ASPIRIN EC 81 MG PO TBEC
81.0000 mg | DELAYED_RELEASE_TABLET | Freq: Every day | ORAL | Status: DC
  Administered 2016-11-24 – 2016-11-25 (×2): 81 mg via ORAL
  Filled 2016-11-24 (×2): qty 1

## 2016-11-24 NOTE — H&P (Signed)
History and Physical    BLAYKLEE DAMBROSIA [id number] DOB: 1946-09-04 DOA: 11/24/2016  PCP: Merrilee Seashore, MD Patient coming from: home  Chief Complaint:  3 days of cough and wheezing  HPI: [name] is a 70 y.o. female with medical history significant of asthma, esophageal stricture from GERD, hiatal hernia, CVA, HLD presenting as a three-day history of wheezing coughing and shortness breath. Initially intermittent but now constant. Getting worse. Patient reports using her nebulizer and MDI inhalers every 6 hours with minimal to no benefit. Denies productive sputum with coughing, fevers, nausea, abdominal pain, dysuria, frequency, diarrhea, back pain, neck stiffness, headache. Patient also complaining of right shoulder pain. Present for partially 4-5 months. Constant. Achy pain. Nothing makes it worse and nothing makes it better. Has not tried anything for her symptoms. Radiate.   ED Course: Objective findings outlined below. CAT 1 hour, Cymetra 125 mg.  Review of Systems: As per HPI otherwise 10 point review of systems negative.   Ambulatory Status: no restrictions  Past Medical History:  Diagnosis Date  . Allergic rhinitis   . Anemia   . Asthma   . Colonic polyp   . DDD (degenerative disc disease), lumbar 07/20/2011  . DJD (degenerative joint disease) of knee    bilateral  . DJD (degenerative joint disease), lumbar 07/20/2011  . Esophageal stricture   . GERD (gastroesophageal reflux disease)   . Hiatal hernia   . Hyperlipidemia   . Osteoporosis   . Stroke ([place])   . Vocal cord dysfunction     Past Surgical History:  Procedure Laterality Date  . CARPAL TUNNEL RELEASE     bilateral  . FOOT SURGERY     bilateral  . knee athroscopy  Dec.3, 2014   left knee  . TOTAL ABDOMINAL HYSTERECTOMY    . VIDEO BRONCHOSCOPY Bilateral 12/12/2014   Procedure: VIDEO BRONCHOSCOPY WITHOUT FLUORO;  Surgeon: Chesley Mires, MD;  Location: [facility] ENDOSCOPY;  Service: Cardiopulmonary;   Laterality: Bilateral;    Social History   Social History  . Marital status: Married    Spouse name: N/A  . Number of children: N/A  . Years of education: N/A   Occupational History  . retired    Social History Main Topics  . Smoking status: Former Smoker    Packs/day: 1.00    Years: 20.00    Types: Cigarettes    Start date: 01/31/1975    Quit date: 08/09/1995  . Smokeless tobacco: Never Used     Comment: quit 18 years ago.  . Alcohol use No  . Drug use: No  . Sexual activity: Not Currently   Other Topics Concern  . Not on file   Social History Narrative  . No narrative on file    Allergies  Allergen Reactions  . Penicillins Itching and Rash    Family History  Problem Relation Age of Onset  . Heart attack Mother   . Colon cancer Father 42  . Cancer Sister   . Asthma Brother   . Asthma      uncle  . Stomach cancer Neg Hx   . Esophageal cancer Neg Hx   . Rectal cancer Neg Hx     Prior to Admission medications   Medication Sig Start Date End Date Taking? Authorizing Provider  albuterol (PROVENTIL HFA) 108 (90 Base) MCG/ACT inhaler Inhale 2 puffs into the lungs every 4 (four) hours as needed for wheezing or shortness of breath. Reported on 12/30/2015 03/04/16  Yes Collene Gobble, MD  albuterol (PROVENTIL) (2.5 MG/3ML) 0.083% nebulizer solution Take 2.5 mg by nebulization every 4 (four) hours as needed for shortness of breath. Reported on 12/30/2015 09/11/13  Yes Collene Gobble, MD  aspirin EC 81 MG tablet Take 81 mg by mouth daily.   Yes Historical Provider, MD  budesonide-formoterol (SYMBICORT) 160-4.5 MCG/ACT inhaler Inhale 2 puffs into the lungs 2 (two) times daily. 08/08/14  Yes Tammy S Parrett, NP  cholecalciferol (VITAMIN D) 1000 UNITS tablet Take 1,000 Units by mouth daily.   Yes Historical Provider, MD  clonazePAM (KLONOPIN) 0.5 MG tablet TAKE 1 TABLET BY MOUTH TWICE DAILY AS NEEDED FOR ANXIETY 04/22/15  Yes Historical Provider, MD  fluticasone (FLONASE) 50  MCG/ACT nasal spray Place 1 spray into the nose 2 (two) times daily.    Yes Historical Provider, MD  folic acid (FOLVITE) 1 MG tablet Take 1 tablet (1 mg total) by mouth daily. 06/03/15  Yes [place], NP  hydroxyurea (HYDREA) 500 MG capsule TAKE 1 CAPSULE (500 MG TOTAL) BY MOUTH 3 (THREE) TIMES DAILY. 07/29/16  Yes [name], MD  Magnesium 200 MG TABS Take 1 tablet by mouth 2 (two) times daily.    Yes Historical Provider, MD  NON FORMULARY Take 1 capsule by mouth every morning. BLACK COHASH   Yes Historical Provider, MD  omeprazole (PRILOSEC) 20 MG capsule TAKE 1 TABLET TWICE A DAY. 09/13/16  Yes Collene Gobble, MD  pravastatin (PRAVACHOL) 40 MG tablet Take 40 mg by mouth daily.   Yes Historical Provider, MD  rOPINIRole (REQUIP) 1 MG tablet Take 0.5 tablet for 3 evenings, then increase to 1 tab for 3 evenings, then increase to 1.5 tabs every evening and stay at that dose Patient taking differently: Take 1.5 mg by mouth daily. Take 0.5 tablet for 3 evenings, then increase to 1 tab for 3 evenings, then increase to 1.5 tabs every evening and stay at that dose 10/13/16  Yes Collene Gobble, MD  sertraline (ZOLOFT) 50 MG tablet Take 50 mg by mouth daily.   Yes Historical Provider, MD  tiotropium (SPIRIVA) 18 MCG inhalation capsule Place 1 capsule (18 mcg total) into inhaler and inhale daily. 08/17/16  Yes Marijean Heath, NP    Physical Exam: Vitals:   11/24/16 1129 11/24/16 1215 11/24/16 1330 11/24/16 1415  BP:  115/56 121/60 118/68  Pulse:  (!) 128 118 115  Resp:  21 17 22   Temp:      TempSrc:      SpO2: 96% 98% 94% 95%     General: Appears to be in mild distress, resting in bed, sitting up. Eyes:  PERRL, EOMI, normal lids, iris ENT:  grossly normal hearing, lips & tongue, mmm Neck:  no LAD, masses or thyromegaly Cardiovascular:  RRR, no m/r/g. No LE edema.  Respiratory: Decreased air movement and wheezing in all lung fields. No crackles or rhonchi. Increased  effort. Abdomen:  soft, ntnd, NABS Skin:  no rash or induration seen on limited exam Musculoskeletal:  grossly normal tone BUE/BLE, good ROM, no bony abnormality Psychiatric:  grossly normal mood and affect, speech fluent and appropriate, AOx3 Neurologic:  CN 2-12 grossly intact, moves all extremities in coordinated fashion, sensation intact  Labs on Admission: I have personally reviewed following labs and imaging studies  CBC:  Recent Labs Lab 11/24/16 0953  WBC 4.7  NEUTROABS 3.7  HGB 12.0  HCT 36.5  MCV 83.9  PLT 123XX123   Basic Metabolic Panel:  Recent Labs Lab 11/24/16  0953  NA 136  K 5.1  CL 106  CO2 19*  GLUCOSE 125*  BUN 9  CREATININE 1.11*  CALCIUM 9.2   GFR: CrCl cannot be calculated (Unknown ideal weight.). Liver Function Tests: No results for input(s): AST, ALT, ALKPHOS, BILITOT, PROT, ALBUMIN in the last 168 hours. No results for input(s): LIPASE, AMYLASE in the last 168 hours. No results for input(s): AMMONIA in the last 168 hours. Coagulation Profile: No results for input(s): INR, PROTIME in the last 168 hours. Cardiac Enzymes: No results for input(s): CKTOTAL, CKMB, CKMBINDEX, TROPONINI in the last 168 hours. BNP (last 3 results) No results for input(s): PROBNP in the last 8760 hours. HbA1C: No results for input(s): HGBA1C in the last 72 hours. CBG: No results for input(s): GLUCAP in the last 168 hours. Lipid Profile: No results for input(s): CHOL, HDL, LDLCALC, TRIG, CHOLHDL, LDLDIRECT in the last 72 hours. Thyroid Function Tests: No results for input(s): TSH, T4TOTAL, FREET4, T3FREE, THYROIDAB in the last 72 hours. Anemia Panel: No results for input(s): VITAMINB12, FOLATE, FERRITIN, TIBC, IRON, RETICCTPCT in the last 72 hours. Urine analysis:    Component Value Date/Time   COLORURINE YELLOW 09/01/2012 2014   APPEARANCEUR CLOUDY (A) 09/01/2012 2014   LABSPEC 1.021 09/01/2012 2014   PHURINE 5.5 09/01/2012 2014   GLUCOSEU NEGATIVE 09/01/2012  2014   GLUCOSEU NEGATIVE 01/11/2011 0950   HGBUR NEGATIVE 09/01/2012 2014   BILIRUBINUR NEGATIVE 09/01/2012 2014   [place] 09/01/2012 2014   PROTEINUR NEGATIVE 09/01/2012 2014   UROBILINOGEN 1.0 09/01/2012 2014   NITRITE NEGATIVE 09/01/2012 2014   LEUKOCYTESUR TRACE (A) 09/01/2012 2014    Creatinine Clearance: CrCl cannot be calculated (Unknown ideal weight.).  Sepsis Labs: @LABRCNTIP (procalcitonin:4,lacticidven:4) )No results found for this or any previous visit (from the past 240 hour(s)).   Radiological Exams on Admission: Dg Chest Portable 1 View  Result Date: 11/24/2016 CLINICAL DATA:  Shortness of breath for the past 3 days associated with nonproductive cough. History of asthma and remote history of smoking. EXAM: PORTABLE CHEST 1 VIEW COMPARISON:  PA and lateral chest x-ray of September 17, 2016 FINDINGS: The lungs are adequately inflated and clear. The heart and pulmonary vascularity are normal. The mediastinum is normal in width. There is calcification in the wall of the aortic arch. There is a small hiatal hernia which appears stable. There is no pleural effusion. There is old cortical irregularity of the lateral aspect of the right fifth rib posteriorly. No acute bony abnormality is observed. IMPRESSION: No pneumonia, CHF, nor other acute cardiopulmonary abnormality. Small hiatal hernia. Thoracic aortic atherosclerosis. Electronically Signed   By: Drevion Offord  [place] M.D.   On: 11/24/2016 10:17    EKG: Independently reviewed. Sinus, no ACS  Assessment/Plan Active Problems:   Essential thrombocythemia (HCC)   AKI (acute kidney injury) ([place])   Right shoulder pain   Acute respiratory distress   RLS (restless legs syndrome)    Respiratory distress: Secondary to asthma exacerbation. According to patient this event happens approximately once per year. Suspect change in weather and possible underlying viral etiology. No evidence of pneumonia or CHF. - Prednisone 60 mg  every 12 hours - Xopenex every 6 with albuterol every 2 when necessary - O2 when necessary - Respiratory viral panel - continue spiriva, Advair - Mag  Tachycardia: EKG showing sinus without ACS. Troponin normal. Likely secondary to respiratory distress and treatment for that. Likely also with some mild dehydration - IVF - Change from albuterol to Xopenex for primary treatment  AKI: suspect dehydration from acute illness. IVF in ED - BMET in in AM  Right shoulder pain: Ongoing for 4-5 months. Suspect arthritis. - Right shoulder x-ray. Tylenol, tramadol.  HLD: - Daily pravastatin  GERD: History of esophagitis and some just richer. Currently a symptomatically. - Continue PPI  RLS: - continue Requip  Depression/anxiety: - Continue Klonopin, Zoloft  Thrombocytosis: followed by Dr. Marin Olp.  - continue hydroxyurea   DVT prophylaxis: lovenox Code Status: full  Family Communication: daughter  Disposition Plan: pending improvement  Consults called: none  Admission status: obs    Lamiracle Chaidez J MD [facility]  If 7PM-7AM, please contact night-coverage [email or web address] Password [email or web address]  11/24/2016, 3:01 PM

## 2016-11-24 NOTE — ED Provider Notes (Signed)
[place] DEPT Provider Note   CSN: [id number] Arrival date & time: 11/24/16  [id number]     History   Chief Complaint Chief Complaint  Patient presents with  . Asthma    HPI [name] is a 70 y.o. female.  HPI   70 year old female with history of asthma, vocal cord dysfunction, prior stroke, allergic rhinitis, GERD presenting to ED with complaint of shortness of breath. Patient report for the past 3 days she has had increased asthma exacerbation including wheezing, nauseous, having pleuritic chest pain, nonproductive cough, and generalized weakness. Symptoms felt similar to an asthma exacerbation that she had last year. She denies any history of intubation and ICU stay due to asthma exacerbation. She denies any precipitating symptoms. She denies having fever, headache, vomiting diarrhea, productive cough, hemoptysis, or rash. She has been using her home medication with minimal improvement. She has had her flu and pneumonia shot this year.  Past Medical History:  Diagnosis Date  . Allergic rhinitis   . Anemia   . Asthma   . Colonic polyp   . DDD (degenerative disc disease), lumbar 07/20/2011  . DJD (degenerative joint disease) of knee    bilateral  . DJD (degenerative joint disease), lumbar 07/20/2011  . Esophageal stricture   . GERD (gastroesophageal reflux disease)   . Hiatal hernia   . Hyperlipidemia   . Osteoporosis   . Stroke ([place])   . Vocal cord dysfunction     Patient Active Problem List   Diagnosis Date Noted  . Restless leg syndrome 10/13/2016  . Arthritis of knee, degenerative 02/18/2015  . Shingles 12/19/2014  . Lung collapse   . Acute on chronic respiratory failure ([place])   . Atelectasis   . Essential thrombocythemia ([place]) 12/24/2013  . History of arthroscopy of knee 11/11/2013  . TIA (transient ischemic attack) 09/01/2012  . Chest pain 09/01/2012  . Arm numbness left 06/03/2012  . Migraine 05/31/2012  . DDD (degenerative disc disease), lumbar  07/20/2011  . DJD (degenerative joint disease), lumbar 07/20/2011  . CVA (cerebral infarction) 07/20/2011  . Acute lumbar radiculopathy 07/12/2011  . Preventative health care 07/10/2011  . TRIGGER FINGER, RIGHT MIDDLE 01/11/2011  . Other dysphagia 01/11/2011  . MENOPAUSAL DISORDER 07/05/2010  . OSTEOARTHRITIS, KNEES, BILATERAL 07/05/2010  . NEVUS 05/20/2010  . VAGINITIS 03/23/2010  . SUPERFICIAL THROMBOPHLEBITIS 03/20/2009  . FATIGUE 03/20/2009  . DYSPHAGIA UNSPECIFIED 02/16/2009  . Snoring 11/05/2008  . HERPES ZOSTER 06/30/2008  . Diabetes ([place]) 06/30/2008  . Hyperlipidemia 06/30/2008  . ESOPHAGEAL STRICTURE 06/30/2008  . FOOT PAIN, RIGHT 06/30/2008  . OSTEOPOROSIS 06/30/2008  . COLONIC POLYPS, HX OF 06/30/2008  . PANIC ATTACK 09/05/2007  . Allergic rhinitis 09/05/2007  . Vocal cord dysfunction 09/05/2007  . Intrinsic asthma 09/05/2007  . Esophageal reflux 09/05/2007    Past Surgical History:  Procedure Laterality Date  . CARPAL TUNNEL RELEASE     bilateral  . FOOT SURGERY     bilateral  . knee athroscopy  Dec.3, 2014   left knee  . TOTAL ABDOMINAL HYSTERECTOMY    . VIDEO BRONCHOSCOPY Bilateral 12/12/2014   Procedure: VIDEO BRONCHOSCOPY WITHOUT FLUORO;  Surgeon: Chesley Mires, MD;  Location: [facility] ENDOSCOPY;  Service: Cardiopulmonary;  Laterality: Bilateral;    OB History    No data available       Home Medications    Prior to Admission medications   Medication Sig Start Date End Date Taking? Authorizing Provider  albuterol (PROVENTIL HFA) 108 (90 Base) MCG/ACT inhaler Inhale 2  puffs into the lungs every 4 (four) hours as needed for wheezing or shortness of breath. Reported on 12/30/2015 03/04/16   Collene Gobble, MD  albuterol (PROVENTIL) (2.5 MG/3ML) 0.083% nebulizer solution Take 2.5 mg by nebulization every 4 (four) hours as needed for shortness of breath. Reported on 12/30/2015 09/11/13   Collene Gobble, MD  aspirin EC 81 MG tablet Take 81 mg by mouth daily.     Historical Provider, MD  budesonide-formoterol (SYMBICORT) 160-4.5 MCG/ACT inhaler Inhale 2 puffs into the lungs 2 (two) times daily. 08/08/14   Tammy S Parrett, NP  cholecalciferol (VITAMIN D) 1000 UNITS tablet Take 1,000 Units by mouth daily.    Historical Provider, MD  clonazePAM (KLONOPIN) 0.5 MG tablet TAKE 1 TABLET BY MOUTH AS NEEDED TWICE A DAY 04/22/15   Historical Provider, MD  fluticasone (FLONASE) 50 MCG/ACT nasal spray Place 1 spray into the nose 2 (two) times daily.     Historical Provider, MD  folic acid (FOLVITE) 1 MG tablet Take 1 tablet (1 mg total) by mouth daily. 06/03/15   Eliezer Bottom, NP  hydroxyurea (HYDREA) 500 MG capsule TAKE 1 CAPSULE (500 MG TOTAL) BY MOUTH 3 (THREE) TIMES DAILY. 07/29/16   [name], MD  Magnesium 200 MG TABS Take 1 tablet by mouth 2 (two) times daily.     Historical Provider, MD  NON FORMULARY Take 1 capsule by mouth every morning. [place]    Historical Provider, MD  omeprazole (PRILOSEC) 20 MG capsule TAKE 1 TABLET TWICE A DAY. 09/13/16   Collene Gobble, MD  pravastatin (PRAVACHOL) 40 MG tablet Take 40 mg by mouth daily.    Historical Provider, MD  rOPINIRole (REQUIP) 1 MG tablet Take 0.5 tablet for 3 evenings, then increase to 1 tab for 3 evenings, then increase to 1.5 tabs every evening and stay at that dose 10/13/16   Collene Gobble, MD  sertraline (ZOLOFT) 50 MG tablet Take 50 mg by mouth daily.    Historical Provider, MD  tiotropium (SPIRIVA) 18 MCG inhalation capsule Place 1 capsule (18 mcg total) into inhaler and inhale daily. 08/17/16   Marijean Heath, NP    Family History Family History  Problem Relation Age of Onset  . Heart attack Mother   . Colon cancer Father 65  . Cancer Sister   . Asthma Brother   . Asthma      uncle  . Stomach cancer Neg Hx   . Esophageal cancer Neg Hx   . Rectal cancer Neg Hx     Social History Social History  Substance Use Topics  . Smoking status: Former Smoker    Packs/day: 1.00     Years: 20.00    Types: Cigarettes    Start date: 01/31/1975    Quit date: 08/09/1995  . Smokeless tobacco: Never Used     Comment: quit 18 years ago.  . Alcohol use No     Allergies   Penicillins   Review of Systems Review of Systems  All other systems reviewed and are negative.    Physical Exam Updated Vital Signs BP 117/63 (BP Location: Left Arm)   Pulse 117   Temp 98.3 F (36.8 C) (Oral)   Resp (!) 28   SpO2 100%   Physical Exam  Constitutional: She is oriented to person, place, and time. She appears well-developed and well-nourished. No distress.  HENT:  Head: Atraumatic.  Eyes: Conjunctivae are normal.  Neck: Neck supple.  Cardiovascular: Intact distal  pulses.   Tachycardia without murmurs rubs gallops  Pulmonary/Chest:  Tachypnea, tachycardia, and diffuse expiratory wheezes throughout  Abdominal: Soft. There is no tenderness.  Musculoskeletal: She exhibits no edema.  Neurological: She is alert and oriented to person, place, and time.  Skin: No rash noted.  Psychiatric: She has a normal mood and affect.  Nursing note and vitals reviewed.    ED Treatments / Results  Labs (all labs ordered are listed, but only abnormal results are displayed) Labs Reviewed  BASIC METABOLIC PANEL - Abnormal; Notable for the following:       Result Value   CO2 19 (*)    Glucose, Bld 125 (*)    Creatinine, Ser 1.11 (*)    GFR calc non Af Amer 49 (*)    GFR calc Af Amer 57 (*)    All other components within normal limits  CBC WITH DIFFERENTIAL/PLATELET - Abnormal; Notable for the following:    RDW 19.7 (*)    All other components within normal limits  BRAIN NATRIURETIC PEPTIDE  I-STAT TROPOININ, ED    EKG  EKG Interpretation  Date/Time:  Thursday November 24 2016 09:22:31 EST Ventricular Rate:  111 PR Interval:  156 QRS Duration: 66 QT Interval:  320 QTC Calculation: 435 R Axis:   62 Text Interpretation:  Sinus tachycardia Right atrial enlargement No significant  change since last tracing Confirmed by Maryan Rued  MD, Loree Fee ([id number]) on 11/24/2016 9:55:58 AM Also confirmed by Maryan Rued  MD, WHITNEY ([id number]), editor [place]  CCT, BEVERLY ([id number])  on 11/24/2016 10:52:16 AM       Radiology Dg Chest Portable 1 View  Result Date: 11/24/2016 CLINICAL DATA:  Shortness of breath for the past 3 days associated with nonproductive cough. History of asthma and remote history of smoking. EXAM: PORTABLE CHEST 1 VIEW COMPARISON:  PA and lateral chest x-ray of September 17, 2016 FINDINGS: The lungs are adequately inflated and clear. The heart and pulmonary vascularity are normal. The mediastinum is normal in width. There is calcification in the wall of the aortic arch. There is a small hiatal hernia which appears stable. There is no pleural effusion. There is old cortical irregularity of the lateral aspect of the right fifth rib posteriorly. No acute bony abnormality is observed. IMPRESSION: No pneumonia, CHF, nor other acute cardiopulmonary abnormality. Small hiatal hernia. Thoracic aortic atherosclerosis. Electronically Signed   By: David  [place] M.D.   On: 11/24/2016 10:17    Procedures Procedures (including critical care time)  Medications Ordered in ED Medications  sodium chloride 0.9 % bolus 1,000 mL (not administered)  albuterol (PROVENTIL) (2.5 MG/3ML) 0.083% nebulizer solution 5 mg ( Nebulization Not Given 11/24/16 1130)  magnesium sulfate IVPB 2 g 50 mL (2 g Intravenous New Bag/Given 11/24/16 1017)  methylPREDNISolone sodium succinate (SOLU-MEDROL) 125 mg/2 mL injection 125 mg (125 mg Intravenous Given 11/24/16 1014)  albuterol (PROVENTIL,VENTOLIN) solution continuous neb (15 mg/hr Nebulization Given 11/24/16 1127)  ipratropium (ATROVENT) nebulizer solution 0.5 mg (0.5 mg Nebulization Given 11/24/16 1013)  benzonatate (TESSALON) capsule 100 mg (100 mg Oral Given 11/24/16 1219)     Initial Impression / Assessment and Plan / ED Course  I have reviewed the  triage vital signs and the nursing notes.  Pertinent labs & imaging results that were available during my care of the patient were reviewed by me and considered in my medical decision making (see chart for details).  Clinical Course     BP 115/56   Pulse (!) 128   Temp  98.3 F (36.8 C) (Oral)   Resp 21   SpO2 98%    Final Clinical Impressions(s) / ED Diagnoses   Final diagnoses:  Exacerbation of intermittent asthma, unspecified asthma severity  Renal insufficiency    New Prescriptions New Prescriptions   No medications on file   10:03 AM Patient with history of asthma presenting with symptoms suggestive of an asthma exacerbation. She is tachycardic, tachypneic, and actively wheezing. She will receive magnesium sulfate, Solu-Medrol, continued albuterol nebs. Workup initiated. May consider admission if symptoms not improving. No infectious symptoms at this time.  1:50 PM Labs are reassuring. EKG without acute ischemic changes. Chest x-ray showing no signs of infection. Patient received breathing treatment, and report mild improvement. She is still wheezing on my reassessment. She was able to maintain oxygenation in the 96-98% on room air when ambulating however she does report having increased shortness of breath. I have low suspicion for PE or ACS. Suspect asthma exacerbation. Plan for admission for further management of her condition.  Care discussed with DR. Plunkett.    2:02 PM Pt was admitted for further management of her asthma exacerbation.   Domenic Moras, PA-C 11/25/16 [id number]    Blanchie Dessert, MD 11/26/16 0830

## 2016-11-24 NOTE — Progress Notes (Signed)
[name] is a 70 y.o. female patient admitted from ED awake, alert - oriented  X 4 - no acute distress noted.  VSS - Blood pressure 118/68, pulse 115, temperature 98.3 F (36.8 C), temperature source Oral, resp. rate 22, SpO2 95 %.    IV in place, occlusive dsg intact without redness.  Orientation to room, and floor completed with information packet given to patient/family.  Patient declined safety video at this time.  Admission INP armband ID verified with patient/family, and in place.   SR up x 2, fall assessment complete, with patient and family able to verbalize understanding of risk associated with falls, and verbalized understanding to call nsg before up out of bed.  Call light within reach, patient able to voice, and demonstrate understanding.  Skin, clean-dry- intact without evidence of bruising, or skin tears.   No evidence of skin break down noted on exam.     Will cont to eval and treat per MD orders.  Celine Ahr, RN 11/24/2016 3:13 PM

## 2016-11-24 NOTE — Progress Notes (Signed)
Received report on pt.

## 2016-11-24 NOTE — ED Triage Notes (Signed)
Pt presents for evaluation of asthma exacerbation x 3 days. Pt. States she used inhaler x2 and neb this AM with no improvement. Pt has wheezing throughout lung fields. Pt. Unable to speak in complete sentences.

## 2016-11-25 DIAGNOSIS — N179 Acute kidney failure, unspecified: Secondary | ICD-10-CM

## 2016-11-25 DIAGNOSIS — G2581 Restless legs syndrome: Secondary | ICD-10-CM

## 2016-11-25 DIAGNOSIS — R0603 Acute respiratory distress: Secondary | ICD-10-CM | POA: Diagnosis not present

## 2016-11-25 DIAGNOSIS — D473 Essential (hemorrhagic) thrombocythemia: Secondary | ICD-10-CM

## 2016-11-25 LAB — BASIC METABOLIC PANEL
Anion gap: 8 (ref 5–15)
BUN: 15 mg/dL (ref 6–20)
CHLORIDE: 107 mmol/L (ref 101–111)
CO2: 24 mmol/L (ref 22–32)
Calcium: 9.3 mg/dL (ref 8.9–10.3)
Creatinine, Ser: 1.11 mg/dL — ABNORMAL HIGH (ref 0.44–1.00)
GFR calc Af Amer: 57 mL/min — ABNORMAL LOW (ref >60)
GFR, EST NON AFRICAN AMERICAN: 49 mL/min — AB (ref >60)
GLUCOSE: 143 mg/dL — AB (ref 65–99)
POTASSIUM: 4.3 mmol/L (ref 3.5–5.1)
Sodium: 139 mmol/L (ref 135–145)

## 2016-11-25 LAB — RESPIRATORY PANEL BY PCR
ADENOVIRUS-RVPPCR: NOT DETECTED
Bordetella pertussis: NOT DETECTED
CHLAMYDOPHILA PNEUMONIAE-RVPPCR: NOT DETECTED
CORONAVIRUS HKU1-RVPPCR: NOT DETECTED
CORONAVIRUS NL63-RVPPCR: NOT DETECTED
Coronavirus 229E: NOT DETECTED
Coronavirus OC43: NOT DETECTED
Influenza A H3: DETECTED — AB
Influenza B: NOT DETECTED
Metapneumovirus: NOT DETECTED
Mycoplasma pneumoniae: NOT DETECTED
PARAINFLUENZA VIRUS 1-RVPPCR: NOT DETECTED
PARAINFLUENZA VIRUS 2-RVPPCR: NOT DETECTED
PARAINFLUENZA VIRUS 3-RVPPCR: NOT DETECTED
Parainfluenza Virus 4: NOT DETECTED
RHINOVIRUS / ENTEROVIRUS - RVPPCR: NOT DETECTED
Respiratory Syncytial Virus: NOT DETECTED

## 2016-11-25 LAB — CBC
HCT: 32.5 % — ABNORMAL LOW (ref 36.0–46.0)
Hemoglobin: 10.6 g/dL — ABNORMAL LOW (ref 12.0–15.0)
MCH: 27 pg (ref 26.0–34.0)
MCHC: 32.6 g/dL (ref 30.0–36.0)
MCV: 82.9 fL (ref 78.0–100.0)
PLATELETS: 318 10*3/uL (ref 150–400)
RBC: 3.92 MIL/uL (ref 3.87–5.11)
RDW: 19.1 % — ABNORMAL HIGH (ref 11.5–15.5)
WBC: 7.1 10*3/uL (ref 4.0–10.5)

## 2016-11-25 MED ORDER — PREDNISONE 10 MG (21) PO TBPK: ORAL_TABLET | ORAL | 0 refills | Status: DC

## 2016-11-25 MED ORDER — AZITHROMYCIN 250 MG PO TABS: ORAL_TABLET | ORAL | 0 refills | Status: DC

## 2016-11-25 MED ORDER — GUAIFENESIN ER 600 MG PO TB12: 1200.0000 mg | ORAL_TABLET | Freq: Two times a day (BID) | ORAL | 0 refills | Status: DC

## 2016-11-25 MED ORDER — LEVALBUTEROL HCL 1.25 MG/0.5ML IN NEBU
1.2500 mg | INHALATION_SOLUTION | Freq: Four times a day (QID) | RESPIRATORY_TRACT | Status: DC
  Administered 2016-11-25 (×2): 1.25 mg via RESPIRATORY_TRACT
  Filled 2016-11-25: qty 0.5

## 2016-11-25 NOTE — Discharge Summary (Addendum)
Physician Discharge Summary  [name] [id number] DOB: 17-Nov-1946 DOA: 11/24/2016  PCP: Merrilee Seashore, MD  Admit date: 11/24/2016 Discharge date: 11/25/2016  Admitted From: Home Disposition: Home  Recommendations for Outpatient Follow-up:  1. Follow up with PCP in 1-2 weeks 2. Please obtain BMP/CBC in one week  Home Health: NA Equipment/Devices:NA  Discharge Condition: Stable CODE STATUS: Full Code Diet recommendation: Diet regular Room service appropriate? Yes; Fluid consistency: Thin Diet - low sodium heart healthy  Brief/Interim Summary: [name] is a 70 y.o. female with medical history significant of asthma, esophageal stricture from GERD, hiatal hernia, CVA, HLD presenting as a three-day history of wheezing coughing and shortness breath. Initially intermittent but now constant. Getting worse. Patient reports using her nebulizer and MDI inhalers every 6 hours with minimal to no benefit. Denies productive sputum with coughing, fevers, nausea, abdominal pain, dysuria, frequency, diarrhea, back pain, neck stiffness, headache. Patient also complaining of right shoulder pain. Present for partially 4-5 months. Constant. Achy pain. Nothing makes it worse and nothing makes it better. Has not tried anything for her symptoms.  Discharge Diagnoses:  Active Problems:   Essential thrombocythemia ([place])   AKI (acute kidney injury) ([place])   Right shoulder pain   Acute respiratory distress   RLS (restless legs syndrome)   Acute upper respiratory tract infection -Presented with SOB, wheezing and sputum production likely viral versus bacterial. -Placed on supportive management with bronchodilators, mucolytics, antitussives and oxygen as needed. -CXR did not show evidence of pneumonia. Respiratory panel Positive for influenza A H3 -Feels much better today, discharged home on Z-Pak, Mucinex and prednisone taper.  Respiratory distress -Secondary to the acuite URTI, patient  also has asthma and was very short of breath especially the night on admission. -She is on room air, saturations 100%. Discharged home to follow-up with PCP.  Tachycardia -EKG showing sinus without ACS. Troponin normal.  -Likely secondary to respiratory distress and treatment for that. Likely also with some mild dehydration  AKI -Suspect dehydration from acute illness. IVF given in the ED. -Nothing is above the baseline of 0.9, instructed for plenty fluid intake, BMP in 1 week by PCP.  Right shoulder pain: Ongoing for 4-5 months. Suspect arthritis. -Right shoulder x-ray showed osteoarthritis.  HLD: - Daily pravastatin  GERD: History of esophagitis and some just richer. Currently a symptomatically. - Continue PPI  RLS: - continue Requip  Depression/anxiety: - Continue Klonopin, Zoloft  Thrombocytosis: followed by Dr. Marin Olp.  - continue hydroxyurea   Discharge Instructions  Discharge Instructions    Diet - low sodium heart healthy    Complete by:  As directed    Increase activity slowly    Complete by:  As directed      Allergies as of 11/25/2016      Reactions   Penicillins Itching, Rash      Medication List    TAKE these medications   albuterol (2.5 MG/3ML) 0.083% nebulizer solution Commonly known as:  PROVENTIL Take 2.5 mg by nebulization every 4 (four) hours as needed for shortness of breath. Reported on 12/30/2015   albuterol 108 (90 Base) MCG/ACT inhaler Commonly known as:  PROVENTIL HFA Inhale 2 puffs into the lungs every 4 (four) hours as needed for wheezing or shortness of breath. Reported on 12/30/2015   aspirin EC 81 MG tablet Take 81 mg by mouth daily.   azithromycin 250 MG tablet Commonly known as:  ZITHROMAX Z-PAK Take 2 tablets on day 1, then one tablet daily   budesonide-formoterol  160-4.5 MCG/ACT inhaler Commonly known as:  SYMBICORT Inhale 2 puffs into the lungs 2 (two) times daily.   cholecalciferol 1000 units tablet Commonly  known as:  VITAMIN D Take 1,000 Units by mouth daily.   clonazePAM 0.5 MG tablet Commonly known as:  KLONOPIN TAKE 1 TABLET BY MOUTH TWICE DAILY AS NEEDED FOR ANXIETY   fluticasone 50 MCG/ACT nasal spray Commonly known as:  FLONASE Place 1 spray into the nose 2 (two) times daily.   folic acid 1 MG tablet Commonly known as:  FOLVITE Take 1 tablet (1 mg total) by mouth daily.   guaiFENesin 600 MG 12 hr tablet Commonly known as:  MUCINEX Take 2 tablets (1,200 mg total) by mouth 2 (two) times daily.   hydroxyurea 500 MG capsule Commonly known as:  HYDREA TAKE 1 CAPSULE (500 MG TOTAL) BY MOUTH 3 (THREE) TIMES DAILY.   Magnesium 200 MG Tabs Take 1 tablet by mouth 2 (two) times daily.   NON FORMULARY Take 1 capsule by mouth every morning. BLACK COHASH   omeprazole 20 MG capsule Commonly known as:  PRILOSEC TAKE 1 TABLET TWICE A DAY.   pravastatin 40 MG tablet Commonly known as:  PRAVACHOL Take 40 mg by mouth daily.   predniSONE 10 MG (21) Tbpk tablet Commonly known as:  STERAPRED UNI-PAK 21 TAB Take 6-5-4-3-2-1 PO orally till gone   rOPINIRole 1 MG tablet Commonly known as:  REQUIP Take 0.5 tablet for 3 evenings, then increase to 1 tab for 3 evenings, then increase to 1.5 tabs every evening and stay at that dose What changed:  how much to take  how to take this  when to take this  additional instructions   sertraline 50 MG tablet Commonly known as:  ZOLOFT Take 50 mg by mouth daily.   tiotropium 18 MCG inhalation capsule Commonly known as:  SPIRIVA Place 1 capsule (18 mcg total) into inhaler and inhale daily.       Allergies  Allergen Reactions  . Penicillins Itching and Rash    Consultations:  None  Procedures (Echo, Carotid, EGD, Colonoscopy, ERCP)   Radiological studies: Dg Shoulder Right  Result Date: 11/24/2016 CLINICAL DATA:  3-4 months of right shoulder pain with difficulty lifting the arm. EXAM: RIGHT SHOULDER - 2+ VIEW COMPARISON:  A  chest x-ray of October 14 27 teen FINDINGS: There is moderate to severe degenerative change of the glenohumeral joint. There is subarticular reactive change in the glenoid. There is some flattening of the extreme medial aspect of the articular surface of the humeral head. There is a prominent osteophyte arising from the inferior articular margin of the humeral head. There are degenerative changes of the [facility] joint. The subacromial subdeltoid space is grossly normal. The observed portions of the right clavicle and upper right ribs are normal. IMPRESSION: Moderate to severe osteoarthritic change of the glenohumeral joint with moderate changes of the AC joint. There is no acute bony abnormality. Electronically Signed   By: David  Martinique M.D.   On: 11/24/2016 16:29   Dg Chest Portable 1 View  Result Date: 11/24/2016 CLINICAL DATA:  Shortness of breath for the past 3 days associated with nonproductive cough. History of asthma and remote history of smoking. EXAM: PORTABLE CHEST 1 VIEW COMPARISON:  PA and lateral chest x-ray of September 17, 2016 FINDINGS: The lungs are adequately inflated and clear. The heart and pulmonary vascularity are normal. The mediastinum is normal in width. There is calcification in the wall of the aortic arch.  There is a small hiatal hernia which appears stable. There is no pleural effusion. There is old cortical irregularity of the lateral aspect of the right fifth rib posteriorly. No acute bony abnormality is observed. IMPRESSION: No pneumonia, CHF, nor other acute cardiopulmonary abnormality. Small hiatal hernia. Thoracic aortic atherosclerosis. Electronically Signed   By: David  Martinique M.D.   On: 11/24/2016 10:17     Subjective:  Discharge Exam: Vitals:   11/25/16 0501 11/25/16 0915 11/25/16 0916 11/25/16 0917  BP: 139/64     Pulse: 95     Resp: 18     Temp: 98.1 F (36.7 C)     TempSrc: Oral     SpO2: 100% 97% 97% 97%   General: Pt is alert, awake, not in acute  distress Cardiovascular: RRR, S1/S2 +, no rubs, no gallops Respiratory: CTA bilaterally, no wheezing, no rhonchi Abdominal: Soft, NT, ND, bowel sounds + Extremities: no edema, no cyanosis   The results of significant diagnostics from this hospitalization (including imaging, microbiology, ancillary and laboratory) are listed below for reference.    Microbiology: No results found for this or any previous visit (from the past 240 hour(s)).   Labs: BNP (last 3 results)  Recent Labs  11/24/16 0953  BNP [id number]   Basic Metabolic Panel:  Recent Labs Lab 11/24/16 0953 11/25/16 0637  NA 136 139  K 5.1 4.3  CL 106 107  CO2 19* 24  GLUCOSE 125* 143*  BUN 9 15  CREATININE 1.11* 1.11*  CALCIUM 9.2 9.3   Liver Function Tests: No results for input(s): AST, ALT, ALKPHOS, BILITOT, PROT, ALBUMIN in the last 168 hours. No results for input(s): LIPASE, AMYLASE in the last 168 hours. No results for input(s): AMMONIA in the last 168 hours. CBC:  Recent Labs Lab 11/24/16 0953 11/25/16 0637  WBC 4.7 7.1  NEUTROABS 3.7  --   HGB 12.0 10.6*  HCT 36.5 32.5*  MCV 83.9 82.9  PLT 340 318   Cardiac Enzymes: No results for input(s): CKTOTAL, CKMB, CKMBINDEX, TROPONINI in the last 168 hours. BNP: Invalid input(s): POCBNP CBG: No results for input(s): GLUCAP in the last 168 hours. D-Dimer No results for input(s): DDIMER in the last 72 hours. Hgb A1c No results for input(s): HGBA1C in the last 72 hours. Lipid Profile No results for input(s): CHOL, HDL, LDLCALC, TRIG, CHOLHDL, LDLDIRECT in the last 72 hours. Thyroid function studies No results for input(s): TSH, T4TOTAL, T3FREE, THYROIDAB in the last 72 hours.  Invalid input(s): FREET3 Anemia work up No results for input(s): VITAMINB12, FOLATE, FERRITIN, TIBC, IRON, RETICCTPCT in the last 72 hours. Urinalysis    Component Value Date/Time   COLORURINE YELLOW 09/01/2012 2014   APPEARANCEUR CLOUDY (A) 09/01/2012 2014   LABSPEC 1.021  09/01/2012 2014   PHURINE 5.5 09/01/2012 2014   GLUCOSEU NEGATIVE 09/01/2012 2014   GLUCOSEU NEGATIVE 01/11/2011 0950   HGBUR NEGATIVE 09/01/2012 2014   [place] NEGATIVE 09/01/2012 2014   [place] 09/01/2012 2014   PROTEINUR NEGATIVE 09/01/2012 2014   UROBILINOGEN 1.0 09/01/2012 2014   NITRITE NEGATIVE 09/01/2012 2014   LEUKOCYTESUR TRACE (A) 09/01/2012 2014   Sepsis Labs Invalid input(s): PROCALCITONIN,  WBC,  LACTICIDVEN Microbiology No results found for this or any previous visit (from the past 240 hour(s)).   Time coordinating discharge: Over 30 minutes  SIGNED:   Birdie Hopes, MD  [facility] 11/25/2016, 11:44 AM Pager   If 7PM-7AM, please contact night-coverage [email or web address] Password [email or web address]

## 2016-11-25 NOTE — Progress Notes (Signed)
Notified provider of respiratory panel positive for influenza A, and confirmed that it is okay to continue with discharge. [name]

## 2016-11-29 ENCOUNTER — Ambulatory Visit: Payer: Medicare Other | Admitting: Family

## 2016-11-29 ENCOUNTER — Other Ambulatory Visit: Payer: Medicare Other

## 2016-12-06 ENCOUNTER — Ambulatory Visit (HOSPITAL_BASED_OUTPATIENT_CLINIC_OR_DEPARTMENT_OTHER): Payer: Medicare Other | Admitting: Family

## 2016-12-06 ENCOUNTER — Other Ambulatory Visit (HOSPITAL_BASED_OUTPATIENT_CLINIC_OR_DEPARTMENT_OTHER): Payer: Medicare Other

## 2016-12-06 VITALS — BP 116/64 | HR 82 | Temp 98.2°F | Wt 182.0 lb

## 2016-12-06 DIAGNOSIS — D573 Sickle-cell trait: Secondary | ICD-10-CM

## 2016-12-06 DIAGNOSIS — D473 Essential (hemorrhagic) thrombocythemia: Secondary | ICD-10-CM | POA: Diagnosis not present

## 2016-12-06 DIAGNOSIS — Z1589 Genetic susceptibility to other disease: Secondary | ICD-10-CM | POA: Insufficient documentation

## 2016-12-06 LAB — CBC WITH DIFFERENTIAL (CANCER CENTER ONLY)
BASO#: 0 10*3/uL (ref 0.0–0.2)
BASO%: 0.6 % (ref 0.0–2.0)
EOS ABS: 0.1 10*3/uL (ref 0.0–0.5)
EOS%: 1 % (ref 0.0–7.0)
HEMATOCRIT: 32.8 % — AB (ref 34.8–46.6)
HGB: 10.8 g/dL — ABNORMAL LOW (ref 11.6–15.9)
LYMPH#: 1.6 10*3/uL (ref 0.9–3.3)
LYMPH%: 31.3 % (ref 14.0–48.0)
MCH: 27.9 pg (ref 26.0–34.0)
MCHC: 32.9 g/dL (ref 32.0–36.0)
MCV: 85 fL (ref 81–101)
MONO#: 0.6 10*3/uL (ref 0.1–0.9)
MONO%: 11.3 % (ref 0.0–13.0)
NEUT%: 55.8 % (ref 39.6–80.0)
NEUTROS ABS: 2.9 10*3/uL (ref 1.5–6.5)
Platelets: 540 10*3/uL — ABNORMAL HIGH (ref 145–400)
RBC: 3.87 10*6/uL (ref 3.70–5.32)
RDW: 18.7 % — ABNORMAL HIGH (ref 11.1–15.7)
WBC: 5.1 10*3/uL (ref 3.9–10.0)

## 2016-12-06 LAB — COMPREHENSIVE METABOLIC PANEL
ALK PHOS: 70 U/L (ref 40–150)
ALT: 10 U/L (ref 0–55)
AST: 15 U/L (ref 5–34)
Albumin: 3.2 g/dL — ABNORMAL LOW (ref 3.5–5.0)
Anion Gap: 10 mEq/L (ref 3–11)
BILIRUBIN TOTAL: 0.44 mg/dL (ref 0.20–1.20)
BUN: 7 mg/dL (ref 7.0–26.0)
CALCIUM: 9.3 mg/dL (ref 8.4–10.4)
CO2: 22 mEq/L (ref 22–29)
Chloride: 108 mEq/L (ref 98–109)
Creatinine: 0.8 mg/dL (ref 0.6–1.1)
EGFR: 83 mL/min/{1.73_m2} — ABNORMAL LOW (ref >90)
GLUCOSE: 80 mg/dL (ref 70–140)
POTASSIUM: 3.8 meq/L (ref 3.5–5.1)
Sodium: 140 mEq/L (ref 136–145)
TOTAL PROTEIN: 7 g/dL (ref 6.4–8.3)

## 2016-12-06 LAB — IRON AND TIBC
%SAT: 44 % (ref 21–57)
Iron: 82 ug/dL (ref 41–142)
TIBC: 186 ug/dL — AB (ref 236–444)
UIBC: 104 ug/dL — ABNORMAL LOW (ref 120–384)

## 2016-12-06 LAB — CHCC SATELLITE - SMEAR

## 2016-12-06 LAB — FERRITIN: FERRITIN: 209 ng/mL (ref 9–269)

## 2016-12-06 MED ORDER — HYDROXYUREA 500 MG PO CAPS: 1000.0000 mg | ORAL_CAPSULE | Freq: Two times a day (BID) | ORAL | 6 refills | Status: DC

## 2016-12-06 MED FILL — HYDROXYUREA 500 MG CAPSULE: 500 | 30 days supply | Qty: 90 | Fill #3

## 2016-12-06 NOTE — Progress Notes (Signed)
Hematology and Oncology Follow Up Visit  [name] [id number] [birth year] 71 y.o. 12/06/2016   Principle Diagnosis:  Essential thrombocythemia-JAK2 POSITIVE Sickle cell trait  Current Therapy:   Hydrea 500 mg PO TID - changed to 1,000 mg PO BID today (123456) Folic acid 1 mg by mouth daily Aspirin 81 mg daily    Interim History:  [name] is here today for a follow-up. She is doing fairly well. Unfortunately, she was in the hospital in December with a respiratory infection and dehydration. She is now home and has finished her antibiotic and steroid dose pack. She states that she is slowly feeling better and had a nice Christmas with her family. They were sweet enough to do all the cooking so she was able to enjoy the festivities without overdoing it.  She still has some mild SOB with exertion that passes with taking some time to rest. She has a dry cough. Lung sounds are course. She is using her inhaler as needed and sees her PCP next week for follow-up. She also has an appointment with pulmonology Dr. Lamonte Sakai next week on 12/13/16.  No fever, chills, n/v, rash, dizziness, headache, vision changes, chest pain, palpitations, abdominal pain or changes in bowel or bladder habits.  Her platelet count continues to climb and is now 540. She verbalized that she is taking her Hydrea 500 mg PO TID as prescribed.   No lymphadenopathy found on exam. No episodes of bleeding, bruising or petechiae.  No swelling, numbness or tingling in her extremities. She still has generalized arthritic aches and pains. No new areas of discomfort.   Her appetite is still down from being sick. She is staying hydrated. Her weight is down 8 lbs since her last visit in November.   Medications:  Allergies as of 12/06/2016      Reactions   Penicillins Itching, Rash      Medication List       Accurate as of 12/06/16 10:51 AM. Always use your most recent med list.          albuterol (2.5 MG/3ML) 0.083% nebulizer  solution Commonly known as:  PROVENTIL Take 2.5 mg by nebulization every 4 (four) hours as needed for shortness of breath. Reported on 12/30/2015   albuterol 108 (90 Base) MCG/ACT inhaler Commonly known as:  PROVENTIL HFA Inhale 2 puffs into the lungs every 4 (four) hours as needed for wheezing or shortness of breath. Reported on 12/30/2015   aspirin EC 81 MG tablet Take 81 mg by mouth daily.   azithromycin 250 MG tablet Commonly known as:  ZITHROMAX Z-PAK Take 2 tablets on day 1, then one tablet daily   budesonide-formoterol 160-4.5 MCG/ACT inhaler Commonly known as:  SYMBICORT Inhale 2 puffs into the lungs 2 (two) times daily.   cholecalciferol 1000 units tablet Commonly known as:  VITAMIN D Take 1,000 Units by mouth daily.   clonazePAM 0.5 MG tablet Commonly known as:  KLONOPIN TAKE 1 TABLET BY MOUTH TWICE DAILY AS NEEDED FOR ANXIETY   fluticasone 50 MCG/ACT nasal spray Commonly known as:  FLONASE Place 1 spray into the nose 2 (two) times daily.   folic acid 1 MG tablet Commonly known as:  FOLVITE Take 1 tablet (1 mg total) by mouth daily.   guaiFENesin 600 MG 12 hr tablet Commonly known as:  MUCINEX Take 2 tablets (1,200 mg total) by mouth 2 (two) times daily.   hydroxyurea 500 MG capsule Commonly known as:  HYDREA TAKE 1 CAPSULE (500 MG  TOTAL) BY MOUTH 3 (THREE) TIMES DAILY.   Magnesium 200 MG Tabs Take 1 tablet by mouth 2 (two) times daily.   NON FORMULARY Take 1 capsule by mouth every morning. BLACK COHASH   omeprazole 20 MG capsule Commonly known as:  PRILOSEC TAKE 1 TABLET TWICE A DAY.   pravastatin 40 MG tablet Commonly known as:  PRAVACHOL Take 40 mg by mouth daily.   predniSONE 10 MG (21) Tbpk tablet Commonly known as:  STERAPRED UNI-PAK 21 TAB Take 6-5-4-3-2-1 PO orally till gone   rOPINIRole 1 MG tablet Commonly known as:  REQUIP Take 0.5 tablet for 3 evenings, then increase to 1 tab for 3 evenings, then increase to 1.5 tabs every evening and  stay at that dose   sertraline 50 MG tablet Commonly known as:  ZOLOFT Take 50 mg by mouth daily.   tiotropium 18 MCG inhalation capsule Commonly known as:  SPIRIVA Place 1 capsule (18 mcg total) into inhaler and inhale daily.       Allergies:  Allergies  Allergen Reactions  . Penicillins Itching and Rash    Past Medical History, Surgical history, Social history, and Family History were reviewed and updated.  Review of Systems: All other 10 point review of systems is negative.   Physical Exam:  vitals were not taken for this visit.  Wt Readings from Last 3 Encounters:  10/18/16 186 lb (84.4 kg)  10/13/16 190 lb (86.2 kg)  09/01/16 193 lb (87.5 kg)    Ocular: Sclerae unicteric, pupils equal, round and reactive to light Ear-nose-throat: Oropharynx clear, dentition fair Lymphatic: No cervical supraclavicular or axillary adenopathy Lungs no rales or rhonchi, good excursion bilaterally Heart regular rate and rhythm, no murmur appreciated Abd soft, nontender, positive bowel sounds, no liver or spleen tip palpated on exam, no fluid wave MSK no focal spinal tenderness, no joint edema Neuro: non-focal, well-oriented, appropriate affect Breasts: Deferred  Lab Results  Component Value Date   WBC 5.1 12/06/2016   HGB 10.8 (L) 12/06/2016   HCT 32.8 (L) 12/06/2016   MCV 85 12/06/2016   PLT 540 (H) 12/06/2016   Lab Results  Component Value Date   FERRITIN 134 10/18/2016   IRON 102 10/18/2016   TIBC 254 10/18/2016   UIBC 152 10/18/2016   IRONPCTSAT 40 10/18/2016   Lab Results  Component Value Date   RETICCTPCT 1.4 08/06/2015   RBC 3.87 12/06/2016   RETICCTABS 73.1 08/06/2015   No results found for: KPAFRELGTCHN, LAMBDASER, KAPLAMBRATIO No results found for: IGGSERUM, IGA, IGMSERUM No results found for: Odetta Pink, SPEI   Chemistry      Component Value Date/Time   NA 139 11/25/2016 0637   NA 142 10/18/2016  1054   K 4.3 11/25/2016 0637   K 4.1 10/18/2016 1054   CL 107 11/25/2016 0637   CL 104 07/21/2014 0940   CO2 24 11/25/2016 0637   CO2 23 10/18/2016 1054   BUN 15 11/25/2016 0637   BUN 13.5 10/18/2016 1054   CREATININE 1.11 (H) 11/25/2016 0637   CREATININE 0.9 10/18/2016 1054      Component Value Date/Time   CALCIUM 9.3 11/25/2016 0637   CALCIUM 9.7 10/18/2016 1054   ALKPHOS 90 10/18/2016 1054   AST 16 10/18/2016 1054   ALT 12 10/18/2016 1054   BILITOT 0.64 10/18/2016 1054     Impression and Plan: Ms. Norrick is a 71 yo African American female with essential thrombocythemia. She is recuperating from a  recent respiratory infection and feeling much better. She follows up with her PCP and pulmonologist next week.  Her platelet count continues to increase and is now 540. She is asymptomatic with this. Hgb is stable at 10.8 and a WBC count of 5.1.  We will increase her Hydrea to 1,000 mg PO BID per Dr. Marin Olp.  She will continue to take her folic acid and aspirin daily.  We will plan to see her back in 6 weeks for repeat lab work and follow-up. She will contact our office with any questions or concerns. We can certainly see her sooner if need be.   Eliezer Bottom, NP 1/2/201810:51 AM

## 2016-12-07 ENCOUNTER — Telehealth: Payer: Self-pay | Admitting: *Deleted

## 2016-12-07 LAB — RETICULOCYTES: Reticulocyte Count: 1.6 % (ref 0.6–2.6)

## 2016-12-07 NOTE — Telephone Encounter (Addendum)
Patient is aware of results   ----- Message from Eliezer Bottom, NP sent at 12/06/2016  2:56 PM EST ----- Regarding: Iron  Iron studies look good. No infusion needed. Thank you!  [name]  ----- Message ----- From: Interface, Lab In Three Zero One Sent: 12/06/2016  10:43 AM To: Eliezer Bottom, NP

## 2016-12-13 ENCOUNTER — Ambulatory Visit (INDEPENDENT_AMBULATORY_CARE_PROVIDER_SITE_OTHER): Payer: Medicare Other | Admitting: Emergency Medicine

## 2016-12-13 ENCOUNTER — Encounter: Payer: Self-pay | Admitting: Emergency Medicine

## 2016-12-13 VITALS — BP 110/60 | HR 96 | Ht 62.0 in | Wt 184.0 lb

## 2016-12-13 DIAGNOSIS — J45909 Unspecified asthma, uncomplicated: Secondary | ICD-10-CM | POA: Diagnosis not present

## 2016-12-13 DIAGNOSIS — J383 Other diseases of vocal cords: Secondary | ICD-10-CM

## 2016-12-13 DIAGNOSIS — K219 Gastro-esophageal reflux disease without esophagitis: Secondary | ICD-10-CM

## 2016-12-13 DIAGNOSIS — R0609 Other forms of dyspnea: Secondary | ICD-10-CM

## 2016-12-13 DIAGNOSIS — R0683 Snoring: Secondary | ICD-10-CM | POA: Diagnosis not present

## 2016-12-13 DIAGNOSIS — G2581 Restless legs syndrome: Secondary | ICD-10-CM

## 2016-12-13 DIAGNOSIS — J301 Allergic rhinitis due to pollen: Secondary | ICD-10-CM

## 2016-12-13 NOTE — Patient Instructions (Addendum)
We will arrange for repeat pulmonary function tests to compare with your priors Continue your Spiriva and Symbicort We will refer you to Pulmonary rehab  Please continue your flonase and omeprazole as prescribed.  Follow with Dr Lamonte Sakai next available with full PFT

## 2016-12-13 NOTE — Progress Notes (Signed)
Subjective:    Patient ID: [name], female    DOB: 04/07/1946   MRN: [id number] HPI 71 -year-old woman with a history of asthma and vocal cord dysfunction both of which have been significantly impacted by severe esophageal reflux. Has a hx esophageal stricture dilations by Dr Henrene Pastor. On omeprazole 20mg  two times a day. Uses Symbicort bid.     ROV 07/13/16 -- On visit for patient with vocal cord dysfunction, upper airway irritation syndrome, and superimposed asthma. She has GERD and allergic rhinitis which exacerbate all of the above. I saw her 06/29/16, treated her with a prednisone taper, continued her Flonase, omeprazole. She had an IgE that was normal at 11, Eosinophil count 0.1.  She remains SOB w exertion, her cough may be a bit better since the prednisone.  She remains on symbicort.  She does not sleep well, she believes that she snores.  Her weight has gone up with the pred use.   ROV 10/13/16 -- she has a history of asthma with coexisting severe vocal cord dysfunction and upper airway irritation syndrome. All of these exacerbated by allergic rhinitis and esophageal reflux. She was seen here in September for exacerbation and treated with prednisone taper, started on Spiriva in addition to her existing Symbicort. A sleep study was done on 09/01/16 that showed all number of obstructive events with an AHI of 3.4. She also had an increase in her periodic limb movements. She was then seen in the emergency department 09/18/16 for increased dyspnea, increased wheeze and SABA use. She improved w steroids, abx. Currently feels that she is breathing at baseline. Cough occasionally. She is using her SABA about bid. She feels that she benefited from Spiriva, has run out it. She does feels like she has some discomfort and needs to move her legs.   Current GERD regimen > omeprazole 20 mg daily, Current allergfy regimen > Flonase  ROV 12/13/16 -- This follow-up visit for history of obstructive lung disease  characterized by upper airway irritation syndrome with vocal cord dysfunction, also superimposed asthma. Both of these are exacerbated by her history of allergic rhinitis and esophageal reflux. She unfortunately was recently admitted to the hospital in late December for an acute exacerbation in setting of suspected URI. She was treated with antibiotics and steroids. Currently maintained on Symbicort and Spiriva, Flonase, omeprazole 20 mg twice daily. She tells me that her breathing remains difficult, having exertional SOB even in absence of UA noise. She is having some nagging cough especially at night. Occasionally has breakthrough GERD sx. She uses SABA a few times a month.   ROS:  As per HPI.    Objective:   Physical Exam Vitals:   12/13/16 0856  BP: 110/60  BP Location: Left Arm  Cuff Size: Normal  Pulse: 96  SpO2: 99%  Weight: 184 lb (83.5 kg)  Height: 5\' 2"  (1.575 m)   Gen: Pleasant, overwt, in no distress,  normal affect,   ENT: No lesions,  mouth clear,  oropharynx clear, no postnasal drip  Neck: supple no JVD, soft insp and exp stridor  Lungs : Decreased BS w/ no wheezing   Cardiovascular: RRR, heart sounds normal, no murmur or gallops, no peripheral edema  Musculoskeletal: No deformities, no cyanosis or clubbing  Neuro: alert, non focal  Skin: Warm,     Assessment & Plan:   Restless leg syndrome Continue current Requip  Dyspnea on exertion SLow worsening of her exertional dyspnea. This can happen even in absence of  flares of her upper airway noise deconditioning. Certainly could also represent a progression of her obstructive lung disease. I believe she needs repeat pulmonary function testing. I'll refer her for pulmonary rehabilitation  Snoring Mild OSA on most recent polysomnogram. Did not merit initiation of CPAP. We will continue to follow.  Intrinsic asthma Difficult to determine  how much of her symptoms are related to VCD vs lower airways disease.  Will  continu to try to treat her baseline GERD and rhinitis. Continue current BD's  Allergic rhinitis Continue Flonase per current schedule  Esophageal reflux Still with some breakthrough symptoms on omeprazole 20 mg twice a day. Consider changing to an alternative she continues to have symptoms. She may need follow-up with Dr Henrene Pastor  Vocal cord dysfunction This continues to be the driver of her daily symptoms. She manages this fairly well, does not use albuterol frequently. Consider titrating up her GERD and rhinitis medications to get better control.  Baltazar Apo, MD, PhD 12/13/2016, 9:25 AM [facility] [phone] or if no answer [phone]

## 2016-12-13 NOTE — Assessment & Plan Note (Signed)
This continues to be the driver of her daily symptoms. She manages this fairly well, does not use albuterol frequently. Consider titrating up her GERD and rhinitis medications to get better control.

## 2016-12-13 NOTE — Assessment & Plan Note (Addendum)
Continue Flonase per current schedule

## 2016-12-13 NOTE — Assessment & Plan Note (Signed)
Mild OSA on most recent polysomnogram. Did not merit initiation of CPAP. We will continue to follow.

## 2016-12-13 NOTE — Assessment & Plan Note (Signed)
SLow worsening of her exertional dyspnea. This can happen even in absence of flares of her upper airway noise deconditioning. Certainly could also represent a progression of her obstructive lung disease. I believe she needs repeat pulmonary function testing. I'll refer her for pulmonary rehabilitation

## 2016-12-13 NOTE — Assessment & Plan Note (Signed)
Continue current Requip

## 2016-12-13 NOTE — Assessment & Plan Note (Signed)
Difficult to determine  how much of her symptoms are related to VCD vs lower airways disease.  Will continu to try to treat her baseline GERD and rhinitis. Continue current BD's

## 2016-12-13 NOTE — Assessment & Plan Note (Signed)
Still with some breakthrough symptoms on omeprazole 20 mg twice a day. Consider changing to an alternative she continues to have symptoms. She may need follow-up with Dr Henrene Pastor

## 2017-01-17 ENCOUNTER — Other Ambulatory Visit: Payer: Medicare Other

## 2017-01-17 ENCOUNTER — Ambulatory Visit: Payer: Medicare Other | Admitting: Family

## 2017-01-20 ENCOUNTER — Other Ambulatory Visit (HOSPITAL_BASED_OUTPATIENT_CLINIC_OR_DEPARTMENT_OTHER): Payer: Medicare Other

## 2017-01-20 ENCOUNTER — Ambulatory Visit (HOSPITAL_BASED_OUTPATIENT_CLINIC_OR_DEPARTMENT_OTHER): Payer: Medicare Other | Admitting: Family

## 2017-01-20 VITALS — BP 121/67 | HR 72 | Temp 98.2°F | Wt 188.2 lb

## 2017-01-20 DIAGNOSIS — D473 Essential (hemorrhagic) thrombocythemia: Secondary | ICD-10-CM | POA: Diagnosis not present

## 2017-01-20 DIAGNOSIS — D573 Sickle-cell trait: Secondary | ICD-10-CM

## 2017-01-20 DIAGNOSIS — Z1589 Genetic susceptibility to other disease: Secondary | ICD-10-CM

## 2017-01-20 LAB — COMPREHENSIVE METABOLIC PANEL
ALK PHOS: 76 U/L (ref 40–150)
ALT: 11 U/L (ref 0–55)
AST: 14 U/L (ref 5–34)
Albumin: 3.5 g/dL (ref 3.5–5.0)
Anion Gap: 10 mEq/L (ref 3–11)
BUN: 12.1 mg/dL (ref 7.0–26.0)
CALCIUM: 9.4 mg/dL (ref 8.4–10.4)
CHLORIDE: 109 meq/L (ref 98–109)
CO2: 23 mEq/L (ref 22–29)
Creatinine: 0.8 mg/dL (ref 0.6–1.1)
EGFR: 81 mL/min/{1.73_m2} — AB (ref >90)
Glucose: 85 mg/dl (ref 70–140)
POTASSIUM: 4 meq/L (ref 3.5–5.1)
SODIUM: 142 meq/L (ref 136–145)
Total Bilirubin: 0.57 mg/dL (ref 0.20–1.20)
Total Protein: 6.7 g/dL (ref 6.4–8.3)

## 2017-01-20 LAB — CBC WITH DIFFERENTIAL (CANCER CENTER ONLY)
BASO#: 0 10*3/uL (ref 0.0–0.2)
BASO%: 0.5 % (ref 0.0–2.0)
EOS ABS: 0.1 10*3/uL (ref 0.0–0.5)
EOS%: 1.6 % (ref 0.0–7.0)
HEMATOCRIT: 35.9 % (ref 34.8–46.6)
HGB: 11.8 g/dL (ref 11.6–15.9)
LYMPH#: 1.6 10*3/uL (ref 0.9–3.3)
LYMPH%: 26.8 % (ref 14.0–48.0)
MCH: 27.3 pg (ref 26.0–34.0)
MCHC: 32.9 g/dL (ref 32.0–36.0)
MCV: 83 fL (ref 81–101)
MONO#: 0.4 10*3/uL (ref 0.1–0.9)
MONO%: 7.2 % (ref 0.0–13.0)
NEUT#: 3.9 10*3/uL (ref 1.5–6.5)
NEUT%: 63.9 % (ref 39.6–80.0)
Platelets: 459 10*3/uL — ABNORMAL HIGH (ref 145–400)
RBC: 4.33 10*6/uL (ref 3.70–5.32)
RDW: 16.3 % — AB (ref 11.1–15.7)
WBC: 6.1 10*3/uL (ref 3.9–10.0)

## 2017-01-20 LAB — IRON AND TIBC
%SAT: 29 % (ref 21–57)
IRON: 72 ug/dL (ref 41–142)
TIBC: 243 ug/dL (ref 236–444)
UIBC: 171 ug/dL (ref 120–384)

## 2017-01-20 LAB — CHCC SATELLITE - SMEAR

## 2017-01-20 LAB — FERRITIN: Ferritin: 80 ng/ml (ref 9–269)

## 2017-01-20 NOTE — Progress Notes (Signed)
Hematology and Oncology Follow Up Visit  HATHAWAY HOTZ [id number] 01-Mar-1946 71 y.o. 01/20/2017   Principle Diagnosis:  Essential thrombocythemia-JAK2 POSITIVE Sickle cell trait  Current Therapy:   Hydrea [id number] mg PO BID Folic acid 1 mg by mouth daily Aspirin 81 mg daily    Interim History:  [name] is here today for a follow-up. She is doing fairly well. She is having trouble with her right shoulder. She has been doing some heavy lifting and thinks this may have exacerbated it. X-ray in December showed moderate to severe osteoarthritic changes. She will be having a steroid injection next week (Monday).  Her respiratory infection has resolved and she will be having a PFT next month with Dr. Lamonte Sakai.  She still has some wheezing throughout and uses her inhaler as needed. She has some SOB at times with over exertion and will take time to rest as needed.  No fever, chills, n/v, cough, rash, dizziness, headache, vision changes, chest pain, palpitations, abdominal pain or changes in bowel or bladder habits.  No lymphadenopathy found on exam. No episodes of bleeding, bruising or petechiae.  No swelling, numbness or tingling in her extremities. She still has generalized arthritic aches and pains. She has maintained a good appetite and is staying well hydrated. Her weight is stable.   Medications:  Allergies as of 01/20/2017      Reactions   Penicillins Itching, Rash      Medication List       Accurate as of 01/20/17  8:49 AM. Always use your most recent med list.          albuterol (2.5 MG/3ML) 0.083% nebulizer solution Commonly known as:  PROVENTIL Take 2.5 mg by nebulization every 4 (four) hours as needed for shortness of breath. Reported on 12/30/2015   albuterol 108 (90 Base) MCG/ACT inhaler Commonly known as:  PROVENTIL HFA Inhale 2 puffs into the lungs every 4 (four) hours as needed for wheezing or shortness of breath. Reported on 12/30/2015   aspirin EC 81 MG  tablet Take 81 mg by mouth daily.   budesonide-formoterol 160-4.5 MCG/ACT inhaler Commonly known as:  SYMBICORT Inhale 2 puffs into the lungs 2 (two) times daily.   cholecalciferol 1000 units tablet Commonly known as:  VITAMIN D Take 1,000 Units by mouth daily.   clonazePAM 0.5 MG tablet Commonly known as:  KLONOPIN TAKE 1 TABLET BY MOUTH TWICE DAILY AS NEEDED FOR ANXIETY   fluticasone 50 MCG/ACT nasal spray Commonly known as:  FLONASE Place 1 spray into the nose 2 (two) times daily.   folic acid 1 MG tablet Commonly known as:  FOLVITE Take 1 tablet (1 mg total) by mouth daily.   guaiFENesin 600 MG 12 hr tablet Commonly known as:  MUCINEX Take 2 tablets (1,200 mg total) by mouth 2 (two) times daily.   hydroxyurea 500 MG capsule Commonly known as:  HYDREA Take 2 capsules (1,000 mg total) by mouth 2 (two) times daily.   Magnesium 200 MG Tabs Take 1 tablet by mouth 2 (two) times daily.   NON FORMULARY Take 1 capsule by mouth every morning. BLACK COHASH   omeprazole 20 MG capsule Commonly known as:  PRILOSEC TAKE 1 TABLET TWICE A DAY.   pravastatin 40 MG tablet Commonly known as:  PRAVACHOL Take 40 mg by mouth daily.   rOPINIRole 1 MG tablet Commonly known as:  REQUIP Take 0.5 tablet for 3 evenings, then increase to 1 tab for 3 evenings, then increase to 1.5 tabs  every evening and stay at that dose   sertraline 50 MG tablet Commonly known as:  ZOLOFT Take 50 mg by mouth daily.   tiotropium 18 MCG inhalation capsule Commonly known as:  SPIRIVA Place 1 capsule (18 mcg total) into inhaler and inhale daily.       Allergies:  Allergies  Allergen Reactions  . Penicillins Itching and Rash    Past Medical History, Surgical history, Social history, and Family History were reviewed and updated.  Review of Systems: All other 10 point review of systems is negative.   Physical Exam:  vitals were not taken for this visit.  Wt Readings from Last 3 Encounters:   12/13/16 184 lb (83.5 kg)  12/06/16 182 lb (82.6 kg)  10/18/16 186 lb (84.4 kg)    Ocular: Sclerae unicteric, pupils equal, round and reactive to light Ear-nose-throat: Oropharynx clear, dentition fair Lymphatic: No cervical supraclavicular or axillary adenopathy Lungs no rales or rhonchi, good excursion bilaterally Heart regular rate and rhythm, no murmur appreciated Abd soft, nontender, positive bowel sounds, no liver or spleen tip palpated on exam, no fluid wave MSK no focal spinal tenderness, no joint edema Neuro: non-focal, well-oriented, appropriate affect Breasts: Deferred  Lab Results  Component Value Date   WBC 6.1 01/20/2017   HGB 11.8 01/20/2017   HCT 35.9 01/20/2017   MCV 83 01/20/2017   PLT 459 (H) 01/20/2017   Lab Results  Component Value Date   FERRITIN 209 12/06/2016   IRON 82 12/06/2016   TIBC 186 (L) 12/06/2016   UIBC 104 (L) 12/06/2016   IRONPCTSAT 44 12/06/2016   Lab Results  Component Value Date   RETICCTPCT 1.4 08/06/2015   RBC 4.33 01/20/2017   RETICCTABS 73.1 08/06/2015   No results found for: KPAFRELGTCHN, LAMBDASER, KAPLAMBRATIO No results found for: IGGSERUM, IGA, IGMSERUM No results found for: Odetta Pink, SPEI   Chemistry      Component Value Date/Time   NA 140 12/06/2016 1023   K 3.8 12/06/2016 1023   CL 107 11/25/2016 0637   CL 104 07/21/2014 0940   CO2 22 12/06/2016 1023   BUN 7.0 12/06/2016 1023   CREATININE 0.8 12/06/2016 1023      Component Value Date/Time   CALCIUM 9.3 12/06/2016 1023   ALKPHOS 70 12/06/2016 1023   AST 15 12/06/2016 1023   ALT 10 12/06/2016 1023   BILITOT 0.44 12/06/2016 1023     Impression and Plan: [name] is a 71 yo African American female with essential thrombocythemia. She has responded nicely to the Hydrea 1000 mg PO BID and platelet count continues to improve. No anemia at this time. WBC count is stable. She is feeling much better and has  no complaints for [place] at this time.  We will have her stay on this same regimen with Hydrea. She will also continue to take her folic acid and aspirin daily.  We will plan to see her back in 2 months for repeat lab work and follow-up. She will contact our office with any questions or concerns. We can certainly see her sooner if need be.   Eliezer Bottom, NP 2/16/20188:49 AM

## 2017-01-21 LAB — RETICULOCYTES: RETICULOCYTE COUNT: 1.8 % (ref 0.6–2.6)

## 2017-02-01 MED FILL — HYDROXYUREA 500 MG CAPSULE: 500 | 30 days supply | Qty: 90 | Fill #4

## 2017-02-03 ENCOUNTER — Other Ambulatory Visit: Payer: Medicare Other

## 2017-02-03 ENCOUNTER — Encounter: Payer: Self-pay | Admitting: Emergency Medicine

## 2017-02-03 ENCOUNTER — Ambulatory Visit (INDEPENDENT_AMBULATORY_CARE_PROVIDER_SITE_OTHER): Payer: Medicare Other | Admitting: Emergency Medicine

## 2017-02-03 DIAGNOSIS — J301 Allergic rhinitis due to pollen: Secondary | ICD-10-CM

## 2017-02-03 DIAGNOSIS — K219 Gastro-esophageal reflux disease without esophagitis: Secondary | ICD-10-CM

## 2017-02-03 DIAGNOSIS — J45909 Unspecified asthma, uncomplicated: Secondary | ICD-10-CM | POA: Diagnosis not present

## 2017-02-03 DIAGNOSIS — J383 Other diseases of vocal cords: Secondary | ICD-10-CM

## 2017-02-03 LAB — PULMONARY FUNCTION TEST
DL/VA % pred: 114 %
DL/VA: 5.37 ml/min/mmHg/L
DLCO COR % PRED: 84 %
DLCO UNC % PRED: 90 %
DLCO UNC: 20.71 ml/min/mmHg
DLCO cor: 19.42 ml/min/mmHg
FEF 25-75 POST: 1.15 L/s
FEF 25-75 PRE: 0.6 L/s
FEF2575-%Change-Post: 93 %
FEF2575-%PRED-PRE: 38 %
FEF2575-%Pred-Post: 73 %
FEV1-%Change-Post: 33 %
FEV1-%PRED-POST: 86 %
FEV1-%Pred-Pre: 65 %
FEV1-POST: 1.48 L
FEV1-Pre: 1.11 L
FEV1FVC-%Change-Post: 7 %
FEV1FVC-%PRED-PRE: 73 %
FEV6-%CHANGE-POST: 22 %
FEV6-%PRED-POST: 113 %
FEV6-%Pred-Pre: 92 %
FEV6-POST: 2.4 L
FEV6-Pre: 1.96 L
FEV6FVC-%CHANGE-POST: -1 %
FEV6FVC-%PRED-POST: 102 %
FEV6FVC-%Pred-Pre: 104 %
FVC-%CHANGE-POST: 24 %
FVC-%Pred-Post: 110 %
FVC-%Pred-Pre: 89 %
FVC-Post: 2.44 L
FVC-Pre: 1.96 L
PRE FEV1/FVC RATIO: 56 %
PRE FEV6/FVC RATIO: 100 %
Post FEV1/FVC ratio: 61 %
Post FEV6/FVC ratio: 98 %
RV % pred: 199 %
RV: 4.34 L
TLC % PRED: 128 %
TLC: 6.3 L

## 2017-02-03 MED ORDER — BUDESONIDE-FORMOTEROL FUMARATE 160-4.5 MCG/ACT IN AERO: 2.0000 | INHALATION_SPRAY | Freq: Two times a day (BID) | RESPIRATORY_TRACT | 0 refills | Status: DC

## 2017-02-03 NOTE — Assessment & Plan Note (Signed)
Only very mild UA noise today. Continue GERD and rhinitis therapy.

## 2017-02-03 NOTE — Addendum Note (Signed)
Addended by: Tyson Dense on: 02/03/2017 11:58 AM   Modules accepted: Orders

## 2017-02-03 NOTE — Assessment & Plan Note (Signed)
Omeprazole bid 

## 2017-02-03 NOTE — Assessment & Plan Note (Signed)
She does have VCD, but her spirometry today shows significant obstruction and BD responsiveness. ? Whether she would be a candidate for immunotherapy. Will check an IgE as a start. Her eosinophil count is normal.   We will check blood work today.  Please continue your Symbicort and Spiriva as you are taking them  Keep albuterol available to use as needed for shortness of breath.  Flu shot up to date.  Depending on blood work and symptoms we will investigate whether there is any medication to add to help your asthma symptoms.  Follow with Dr Lamonte Sakai in 4 months or sooner if you have any problems.

## 2017-02-03 NOTE — Progress Notes (Signed)
Subjective:    Patient ID: [name], female    DOB: [birth year]   MRN: [id number] HPI 71 -year-old woman with a history of asthma and vocal cord dysfunction both of which have been significantly impacted by severe esophageal reflux. Has a hx esophageal stricture dilations by Dr Henrene Pastor. On omeprazole 20mg  two times a day. Uses Symbicort bid.     ROV 07/13/16 -- On visit for patient with vocal cord dysfunction, upper airway irritation syndrome, and superimposed asthma. She has GERD and allergic rhinitis which exacerbate all of the above. I saw her 06/29/16, treated her with a prednisone taper, continued her Flonase, omeprazole. She had an IgE that was normal at 11, Eosinophil count 0.1.  She remains SOB w exertion, her cough may be a bit better since the prednisone.  She remains on symbicort.  She does not sleep well, she believes that she snores.  Her weight has gone up with the pred use.   ROV 10/13/16 -- she has a history of asthma with coexisting severe vocal cord dysfunction and upper airway irritation syndrome. All of these exacerbated by allergic rhinitis and esophageal reflux. She was seen here in September for exacerbation and treated with prednisone taper, started on Spiriva in addition to her existing Symbicort. A sleep study was done on 09/01/16 that showed all number of obstructive events with an AHI of 3.4. She also had an increase in her periodic limb movements. She was then seen in the emergency department 09/18/16 for increased dyspnea, increased wheeze and SABA use. She improved w steroids, abx. Currently feels that she is breathing at baseline. Cough occasionally. She is using her SABA about bid. She feels that she benefited from Spiriva, has run out it. She does feels like she has some discomfort and needs to move her legs.   Current GERD regimen > omeprazole 20 mg daily, Current allergfy regimen > Flonase  ROV 12/13/16 -- This follow-up visit for history of obstructive lung disease  characterized by upper airway irritation syndrome with vocal cord dysfunction, also superimposed asthma. Both of these are exacerbated by her history of allergic rhinitis and esophageal reflux. She unfortunately was recently admitted to the hospital in late December for an acute exacerbation in setting of suspected URI. She was treated with antibiotics and steroids. Currently maintained on Symbicort and Spiriva, Flonase, omeprazole 20 mg twice daily. She tells me that her breathing remains difficult, having exertional SOB even in absence of UA noise. She is having some nagging cough especially at night. Occasionally has breakthrough GERD sx. She uses SABA a few times a month.   ROV 02/03/17 -- pt has a hx asthma, VCD, also has allergic rhinitis with esophageal reflux. Her overall dyspnea has been worsening so we performed repeat PFT today that I have reviewed. Spirometry shows evidence for moderately severe obstruction with a vigorous bronchodilator response. Lung volumes are profoundly hyperinflated.. Diffusion capacity is normal. She had a CBC 01/20/17 that showed a normal white count and normal eosinophil count. She has done fairly well since last time. She is on symbicort and spiriva.  She is on flonase and omeprazole bid.    ROS:  As per HPI.    Objective:   Physical Exam Vitals:   02/03/17 1012  BP: 122/76  BP Location: Left Arm  Patient Position: Sitting  Cuff Size: Large  Pulse: 81  SpO2: 100%  Weight: 188 lb 3.2 oz (85.4 kg)  Height: 5\' 2"  (1.575 m)   Gen: Pleasant, overwt, in  no distress,  normal affect,   ENT: No lesions,  mouth clear,  oropharynx clear, no postnasal drip  Neck: supple no JVD, soft insp and exp stridor  Lungs : Decreased BS w/ no wheezing   Cardiovascular: RRR, heart sounds normal, no murmur or gallops, no peripheral edema  Musculoskeletal: No deformities, no cyanosis or clubbing  Neuro: alert, non focal  Skin: Warm,     Assessment & Plan:   Intrinsic  asthma She does have VCD, but her spirometry today shows significant obstruction and BD responsiveness. ? Whether she would be a candidate for immunotherapy. Will check an IgE as a start. Her eosinophil count is normal.   We will check blood work today.  Please continue your Symbicort and Spiriva as you are taking them  Keep albuterol available to use as needed for shortness of breath.  Flu shot up to date.  Depending on blood work and symptoms we will investigate whether there is any medication to add to help your asthma symptoms.  Follow with Dr Lamonte Sakai in 4 months or sooner if you have any problems.  Vocal cord dysfunction Only very mild UA noise today. Continue GERD and rhinitis therapy.   Esophageal reflux Omeprazole bid  Allergic rhinitis flonase bid  Baltazar Apo, MD, PhD 02/03/2017, 10:41 AM [facility] [phone] or if no answer [phone]

## 2017-02-03 NOTE — Assessment & Plan Note (Signed)
flonase bid

## 2017-02-03 NOTE — Patient Instructions (Addendum)
We will check blood work today.  Please continue your Symbicort and Spiriva as you are taking them  Keep albuterol available to use as needed for shortness of breath.  Flu shot up to date.  Depending on blood work and symptoms we will investigate whether there is any medication to add to help your asthma symptoms.  Follow with Dr Lamonte Sakai in 4 months or sooner if you have any problems.

## 2017-02-06 LAB — IGE: IgE (Immunoglobulin E), Serum: 11 kU/L (ref <115)

## 2017-03-09 ENCOUNTER — Other Ambulatory Visit: Payer: Self-pay | Admitting: Emergency Medicine

## 2017-03-23 ENCOUNTER — Ambulatory Visit (HOSPITAL_BASED_OUTPATIENT_CLINIC_OR_DEPARTMENT_OTHER): Payer: Medicare Other | Admitting: Family

## 2017-03-23 ENCOUNTER — Other Ambulatory Visit (HOSPITAL_BASED_OUTPATIENT_CLINIC_OR_DEPARTMENT_OTHER): Payer: Medicare Other

## 2017-03-23 VITALS — BP 123/65 | HR 64 | Temp 98.2°F | Resp 19 | Wt 193.0 lb

## 2017-03-23 DIAGNOSIS — Z1589 Genetic susceptibility to other disease: Secondary | ICD-10-CM

## 2017-03-23 DIAGNOSIS — D573 Sickle-cell trait: Secondary | ICD-10-CM | POA: Diagnosis not present

## 2017-03-23 DIAGNOSIS — D473 Essential (hemorrhagic) thrombocythemia: Secondary | ICD-10-CM

## 2017-03-23 LAB — CBC WITH DIFFERENTIAL (CANCER CENTER ONLY)
BASO#: 0 10*3/uL (ref 0.0–0.2)
BASO%: 0.6 % (ref 0.0–2.0)
EOS%: 1.4 % (ref 0.0–7.0)
Eosinophils Absolute: 0.1 10*3/uL (ref 0.0–0.5)
HEMATOCRIT: 37.8 % (ref 34.8–46.6)
HEMOGLOBIN: 12.3 g/dL (ref 11.6–15.9)
LYMPH#: 1.4 10*3/uL (ref 0.9–3.3)
LYMPH%: 19.9 % (ref 14.0–48.0)
MCH: 25.6 pg — ABNORMAL LOW (ref 26.0–34.0)
MCHC: 32.5 g/dL (ref 32.0–36.0)
MCV: 79 fL — ABNORMAL LOW (ref 81–101)
MONO#: 0.5 10*3/uL (ref 0.1–0.9)
MONO%: 7.4 % (ref 0.0–13.0)
NEUT%: 70.7 % (ref 39.6–80.0)
NEUTROS ABS: 4.9 10*3/uL (ref 1.5–6.5)
Platelets: 527 10*3/uL — ABNORMAL HIGH (ref 145–400)
RBC: 4.8 10*6/uL (ref 3.70–5.32)
RDW: 14.9 % (ref 11.1–15.7)
WBC: 6.9 10*3/uL (ref 3.9–10.0)

## 2017-03-23 LAB — COMPREHENSIVE METABOLIC PANEL
ALBUMIN: 3.4 g/dL — AB (ref 3.5–5.0)
ALK PHOS: 74 U/L (ref 40–150)
ALT: 12 U/L (ref 0–55)
ANION GAP: 10 meq/L (ref 3–11)
AST: 15 U/L (ref 5–34)
BUN: 14.5 mg/dL (ref 7.0–26.0)
CO2: 24 mEq/L (ref 22–29)
Calcium: 9.3 mg/dL (ref 8.4–10.4)
Chloride: 106 mEq/L (ref 98–109)
Creatinine: 0.8 mg/dL (ref 0.6–1.1)
EGFR: 88 mL/min/{1.73_m2} — ABNORMAL LOW (ref >90)
GLUCOSE: 91 mg/dL (ref 70–140)
POTASSIUM: 4 meq/L (ref 3.5–5.1)
SODIUM: 140 meq/L (ref 136–145)
Total Bilirubin: 0.36 mg/dL (ref 0.20–1.20)
Total Protein: 6.7 g/dL (ref 6.4–8.3)

## 2017-03-23 LAB — IRON AND TIBC
%SAT: 16 % — ABNORMAL LOW (ref 21–57)
IRON: 40 ug/dL — AB (ref 41–142)
TIBC: 256 ug/dL (ref 236–444)
UIBC: 216 ug/dL (ref 120–384)

## 2017-03-23 LAB — CHCC SATELLITE - SMEAR

## 2017-03-23 LAB — FERRITIN: Ferritin: 49 ng/ml (ref 9–269)

## 2017-03-23 NOTE — Progress Notes (Signed)
Hematology and Oncology Follow Up Visit  [name] [id number] 1946/08/29 71 y.o. 03/23/2017   Principle Diagnosis:  Essential thrombocythemia-JAK2 POSITIVE Sickle cell trait  Current Therapy:   Hydrea 7,846 mg PO BID Folic acid 1 mg by mouth daily Aspirin 81 mg daily   Interim History:  [name] is here today for follow-up. She states that her asthma has worsened, per Dr. Lamonte Sakai, and that she has had a hard time with all the pollen we are having. She has an inhaler to use as needed. She has SOB with over exertion and will take time out to rest as needed.  Her platelet count today is stable at 527. She verbalized that she is taking her Hydrea as prescribed.  She also verbalized that she is taking her aspirin and folic acid daily as prescribed as well.  No fever, chills, n/v, cough, rash, dizziness, headache, vision changes, chest pain, palpitations, abdominal pain or changes in bowel or bladder habits. She has occasional constipation and takes Miralax as needed.  No lymphadenopathy found on exam. No episodes of bleeding, bruising or petechiae.  No swelling, numbness or tingling in her extremities. She still has generalized arthritic aches and pains. She still has a lot of issues with right shoulder pain. She received a cortisone shot but states this did not give her any relief. She states that she has an appointment with an orthopedic surgeon for evaluation but hopes she will not need to have surgery.  She has maintained a good appetite and is staying well hydrated. Her weight is stable.  She just returned from [place] 3 weeks ago with her children and grandchildren and they had a wonderful time. She will be going back down to [place] tomorrow to attend her Pastor's graduation from [employer].   ECOG Performance Status: 1 - Symptomatic but completely ambulatory  Medications:  Allergies as of 03/23/2017      Reactions   Penicillins Itching, Rash      Medication List         Accurate as of 03/23/17 10:18 AM. Always use your most recent med list.          albuterol (2.5 MG/3ML) 0.083% nebulizer solution Commonly known as:  PROVENTIL Take 2.5 mg by nebulization every 4 (four) hours as needed for shortness of breath. Reported on 12/30/2015   albuterol 108 (90 Base) MCG/ACT inhaler Commonly known as:  PROVENTIL HFA Inhale 2 puffs into the lungs every 4 (four) hours as needed for wheezing or shortness of breath. Reported on 12/30/2015   aspirin EC 81 MG tablet Take 81 mg by mouth daily.   budesonide-formoterol 160-4.5 MCG/ACT inhaler Commonly known as:  SYMBICORT Inhale 2 puffs into the lungs 2 (two) times daily.   cholecalciferol 1000 units tablet Commonly known as:  VITAMIN D Take 1,000 Units by mouth daily.   clonazePAM 0.5 MG tablet Commonly known as:  KLONOPIN TAKE 1 TABLET BY MOUTH TWICE DAILY AS NEEDED FOR ANXIETY   fluticasone 50 MCG/ACT nasal spray Commonly known as:  FLONASE Place 1 spray into the nose 2 (two) times daily.   folic acid 1 MG tablet Commonly known as:  FOLVITE Take 1 tablet (1 mg total) by mouth daily.   guaiFENesin 600 MG 12 hr tablet Commonly known as:  MUCINEX Take 2 tablets (1,200 mg total) by mouth 2 (two) times daily.   hydroxyurea 500 MG capsule Commonly known as:  HYDREA Take 2 capsules (1,000 mg total) by mouth 2 (two) times  daily.   Magnesium 200 MG Tabs Take 1 tablet by mouth 2 (two) times daily.   NON FORMULARY Take 1 capsule by mouth every morning. BLACK COHASH   omeprazole 20 MG capsule Commonly known as:  PRILOSEC TAKE 1 TABLET TWICE A DAY.   pravastatin 40 MG tablet Commonly known as:  PRAVACHOL Take 40 mg by mouth daily.   rOPINIRole 1 MG tablet Commonly known as:  REQUIP Take 0.5 tablet for 3 evenings, then increase to 1 tab for 3 evenings, then increase to 1.5 tabs every evening and stay at that dose   sertraline 50 MG tablet Commonly known as:  ZOLOFT Take 50 mg by mouth daily.    tiotropium 18 MCG inhalation capsule Commonly known as:  SPIRIVA Place 1 capsule (18 mcg total) into inhaler and inhale daily.       Allergies:  Allergies  Allergen Reactions  . Penicillins Itching and Rash    Past Medical History, Surgical history, Social history, and Family History were reviewed and updated.  Review of Systems: All other 10 point review of systems is negative.   Physical Exam:  weight is 193 lb (87.5 kg). Her oral temperature is 98.2 F (36.8 C). Her blood pressure is 123/65 and her pulse is 64. Her respiration is 19 and oxygen saturation is 100%.   Wt Readings from Last 3 Encounters:  03/23/17 193 lb (87.5 kg)  02/03/17 188 lb 3.2 oz (85.4 kg)  01/20/17 188 lb 4 oz (85.4 kg)    Ocular: Sclerae unicteric, pupils equal, round and reactive to light Ear-nose-throat: Oropharynx clear, dentition fair Lymphatic: No cervical, supraclavicular or axillary adenopathy Lungs no rales or rhonchi, good excursion bilaterally Heart regular rate and rhythm, no murmur appreciated Abd soft, nontender, positive bowel sounds, no liver or spleen tip palpated on exam, no fluid wave  MSK no focal spinal tenderness, no joint edema Neuro: non-focal, well-oriented, appropriate affect Breasts: Deferred  Lab Results  Component Value Date   WBC 6.9 03/23/2017   HGB 12.3 03/23/2017   HCT 37.8 03/23/2017   MCV 79 (L) 03/23/2017   PLT 527 (H) 03/23/2017   Lab Results  Component Value Date   FERRITIN 80 01/20/2017   IRON 72 01/20/2017   TIBC 243 01/20/2017   UIBC 171 01/20/2017   IRONPCTSAT 29 01/20/2017   Lab Results  Component Value Date   RETICCTPCT 1.4 08/06/2015   RBC 4.80 03/23/2017   RETICCTABS 73.1 08/06/2015   No results found for: KPAFRELGTCHN, LAMBDASER, KAPLAMBRATIO No results found for: IGGSERUM, IGA, IGMSERUM No results found for: Odetta Pink, SPEI   Chemistry      Component Value Date/Time   NA  142 01/20/2017 0806   K 4.0 01/20/2017 0806   CL 107 11/25/2016 0637   CL 104 07/21/2014 0940   CO2 23 01/20/2017 0806   BUN 12.1 01/20/2017 0806   CREATININE 0.8 01/20/2017 0806      Component Value Date/Time   CALCIUM 9.4 01/20/2017 0806   ALKPHOS 76 01/20/2017 0806   AST 14 01/20/2017 0806   ALT 11 01/20/2017 0806   BILITOT 0.57 01/20/2017 0806      Impression and Plan: Ms. Triplett is a pleasant 71 yo African American female with essential thrombocythemia. She has responded nicely to the Hydrea 1000 mg PO BID and platelet count is stable at 527. No anemia at this time. WBC count is 6.9.  We will have her continue on her same regimen  with Hydrea. She will also continue to take her folic acid and aspirin daily.  I spent 15 minutes face to face counseling the patient.  We will plan to see her back in 2 months for repeat lab work and follow-up. She will contact our office with any questions or concerns. We can certainly see her sooner if need be.   Eliezer Bottom, NP 4/19/201810:18 AM

## 2017-03-24 LAB — RETICULOCYTES: Reticulocyte Count: 1.4 % (ref 0.6–2.6)

## 2017-04-03 MED FILL — HYDROXYUREA 500 MG CAPSULE: 500 | 30 days supply | Qty: 90 | Fill #5

## 2017-05-24 MED FILL — HYDROXYUREA 500 MG CAPSULE: 500 | 30 days supply | Qty: 90 | Fill #6

## 2017-05-25 ENCOUNTER — Ambulatory Visit (HOSPITAL_BASED_OUTPATIENT_CLINIC_OR_DEPARTMENT_OTHER): Payer: Medicare Other | Admitting: Family

## 2017-05-25 ENCOUNTER — Other Ambulatory Visit (HOSPITAL_BASED_OUTPATIENT_CLINIC_OR_DEPARTMENT_OTHER): Payer: Medicare Other

## 2017-05-25 VITALS — BP 133/79 | HR 79 | Temp 98.0°F | Resp 17 | Wt 187.0 lb

## 2017-05-25 DIAGNOSIS — D473 Essential (hemorrhagic) thrombocythemia: Secondary | ICD-10-CM

## 2017-05-25 DIAGNOSIS — D573 Sickle-cell trait: Secondary | ICD-10-CM

## 2017-05-25 DIAGNOSIS — Z1589 Genetic susceptibility to other disease: Secondary | ICD-10-CM

## 2017-05-25 LAB — COMPREHENSIVE METABOLIC PANEL
ALT: 14 U/L (ref 0–55)
ANION GAP: 12 meq/L — AB (ref 3–11)
AST: 21 U/L (ref 5–34)
Albumin: 3.3 g/dL — ABNORMAL LOW (ref 3.5–5.0)
Alkaline Phosphatase: 74 U/L (ref 40–150)
BUN: 13.3 mg/dL (ref 7.0–26.0)
CHLORIDE: 105 meq/L (ref 98–109)
CO2: 24 meq/L (ref 22–29)
CREATININE: 0.8 mg/dL (ref 0.6–1.1)
Calcium: 9.5 mg/dL (ref 8.4–10.4)
EGFR: 82 mL/min/{1.73_m2} — ABNORMAL LOW (ref >90)
GLUCOSE: 79 mg/dL (ref 70–140)
Potassium: 3.9 mEq/L (ref 3.5–5.1)
SODIUM: 142 meq/L (ref 136–145)
Total Bilirubin: 0.37 mg/dL (ref 0.20–1.20)
Total Protein: 6.7 g/dL (ref 6.4–8.3)

## 2017-05-25 LAB — CBC WITH DIFFERENTIAL (CANCER CENTER ONLY)
BASO#: 0 10*3/uL (ref 0.0–0.2)
BASO%: 0.6 % (ref 0.0–2.0)
EOS%: 1.6 % (ref 0.0–7.0)
Eosinophils Absolute: 0.1 10*3/uL (ref 0.0–0.5)
HCT: 39.1 % (ref 34.8–46.6)
HGB: 12.8 g/dL (ref 11.6–15.9)
LYMPH#: 1.5 10*3/uL (ref 0.9–3.3)
LYMPH%: 23.6 % (ref 14.0–48.0)
MCH: 24.4 pg — ABNORMAL LOW (ref 26.0–34.0)
MCHC: 32.7 g/dL (ref 32.0–36.0)
MCV: 75 fL — AB (ref 81–101)
MONO#: 0.5 10*3/uL (ref 0.1–0.9)
MONO%: 7 % (ref 0.0–13.0)
NEUT#: 4.3 10*3/uL (ref 1.5–6.5)
NEUT%: 67.2 % (ref 39.6–80.0)
PLATELETS: 492 10*3/uL — AB (ref 145–400)
RBC: 5.24 10*6/uL (ref 3.70–5.32)
RDW: 16.4 % — AB (ref 11.1–15.7)
WBC: 6.5 10*3/uL (ref 3.9–10.0)

## 2017-05-25 LAB — CHCC SATELLITE - SMEAR

## 2017-05-25 LAB — IRON AND TIBC
%SAT: 20 % — ABNORMAL LOW (ref 21–57)
Iron: 51 ug/dL (ref 41–142)
TIBC: 250 ug/dL (ref 236–444)
UIBC: 199 ug/dL (ref 120–384)

## 2017-05-25 LAB — FERRITIN: FERRITIN: 56 ng/mL (ref 9–269)
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
## 2017-05-26 LAB — RETICULOCYTES: Reticulocyte Count: 1 % (ref 0.6–2.6)

## 2017-06-12 ENCOUNTER — Other Ambulatory Visit (INDEPENDENT_AMBULATORY_CARE_PROVIDER_SITE_OTHER): Payer: Medicare Other

## 2017-06-12 ENCOUNTER — Ambulatory Visit (INDEPENDENT_AMBULATORY_CARE_PROVIDER_SITE_OTHER): Payer: Medicare Other | Admitting: Emergency Medicine

## 2017-06-12 ENCOUNTER — Encounter: Payer: Self-pay | Admitting: Emergency Medicine

## 2017-06-12 DIAGNOSIS — J45909 Unspecified asthma, uncomplicated: Secondary | ICD-10-CM

## 2017-06-12 LAB — CBC WITH DIFFERENTIAL/PLATELET
Basophils Absolute: 0 10*3/uL (ref 0.0–0.1)
Basophils Relative: 0.5 % (ref 0.0–3.0)
Eosinophils Absolute: 0.1 10*3/uL (ref 0.0–0.7)
Eosinophils Relative: 1.8 % (ref 0.0–5.0)
HCT: 37.5 % (ref 36.0–46.0)
Hemoglobin: 12.2 g/dL (ref 12.0–15.0)
Lymphocytes Relative: 25.8 % (ref 12.0–46.0)
Lymphs Abs: 2 10*3/uL (ref 0.7–4.0)
MCHC: 32.4 g/dL (ref 30.0–36.0)
MCV: 75.6 fl — ABNORMAL LOW (ref 78.0–100.0)
Monocytes Absolute: 0.6 10*3/uL (ref 0.1–1.0)
Monocytes Relative: 7.2 % (ref 3.0–12.0)
Neutro Abs: 5 10*3/uL (ref 1.4–7.7)
Neutrophils Relative %: 64.7 % (ref 43.0–77.0)
Platelets: 496 10*3/uL — ABNORMAL HIGH (ref 150.0–400.0)
RBC: 4.96 Mil/uL (ref 3.87–5.11)
RDW: 17.9 % — ABNORMAL HIGH (ref 11.5–15.5)
WBC: 7.8 10*3/uL (ref 4.0–10.5)

## 2017-06-12 MED ORDER — BUDESONIDE-FORMOTEROL FUMARATE 160-4.5 MCG/ACT IN AERO: 2.0000 | INHALATION_SPRAY | Freq: Two times a day (BID) | RESPIRATORY_TRACT | 0 refills | Status: DC

## 2017-06-12 NOTE — Assessment & Plan Note (Signed)
Fluticasone nasal spray as ordered

## 2017-06-12 NOTE — Assessment & Plan Note (Signed)
Continue omeprazole as ordered 

## 2017-06-12 NOTE — Progress Notes (Signed)
  Subjective:    Patient ID: [name], female    DOB: [birth year]   MRN: [id number] HPI 71 -year-old woman with a history of asthma and vocal cord dysfunction both of which have been significantly impacted by severe esophageal reflux. Has a hx esophageal stricture dilations by Dr Henrene Pastor. On omeprazole 20mg  two times a day. Uses Symbicort bid.    ROV 02/03/17 -- pt has a hx asthma, VCD, also has allergic rhinitis with esophageal reflux. Her overall dyspnea has been worsening so we performed repeat PFT today that I have reviewed. Spirometry shows evidence for moderately severe obstruction with a vigorous bronchodilator response. Lung volumes are profoundly hyperinflated.. Diffusion capacity is normal. She had a CBC 01/20/17 that showed a normal white count and normal eosinophil count. She has done fairly well since last time. She is on symbicort and spiriva.  She is on flonase and omeprazole bid.    ROV 06/12/17 -- This follow-up visit for patient with a history of asthma, vocal cord dysfunction, allergic rhinitis, esophageal reflux. She has been managed on Symbicort and Spiriva. We performed IgE which was 11 on 02/03/17. She tells me that she has been having more trouble with dyspnea, wheeze, more a chest wheeze than her VCD sx. Some dry cough for the last 2 weeks.   ROS:  As per HPI.    Objective:   Physical Exam Vitals:   06/12/17 1339  BP: 126/82  Pulse: 93  SpO2: 94%  Weight: 183 lb 9.6 oz (83.3 kg)  Height: 5\' 2"  (1.575 m)   Gen: Pleasant, overwt, in no distress,  normal affect,   ENT: No lesions,  mouth clear,  oropharynx clear, no postnasal drip  Neck: supple no JVD, soft Upper airway noise  Lungs : Decreased BS With bilateral diffuse expiratory wheezes  Cardiovascular: RRR, heart sounds normal, no murmur or gallops, no peripheral edema  Musculoskeletal: No deformities, no cyanosis or clubbing  Neuro: alert, non focal  Skin: Warm,     Assessment & Plan:   Intrinsic  asthma With an apparent acute exacerbation, chest wheezing above and beyond any upper airway noise. Question whether she may be a candidate for immunotherapy as we go follow her. Her eosinophil count and IgE have been normal. I will recheck these today before she gets steroids. Continue her other inhaled medication, treatment for GERD, treatment for allergic rhinitis.  Please take prednisone as directed until completely gone Continue your inhaled medications as you are taking them Continue Flonase and omeprazole as you are using them We will consider alternative asthma therapy as we go forward. Follow with Dr Lamonte Sakai in 3 months or sooner if you have any problems.  Baltazar Apo, MD, PhD 06/12/2017, 1:51 PM [facility] [phone] or if no answer [phone]

## 2017-06-12 NOTE — Assessment & Plan Note (Signed)
With an apparent acute exacerbation, chest wheezing above and beyond any upper airway noise. Question whether she may be a candidate for immunotherapy as we go follow her. Her eosinophil count and IgE have been normal. I will recheck these today before she gets steroids. Continue her other inhaled medication, treatment for GERD, treatment for allergic rhinitis.  Please take prednisone as directed until completely gone Continue your inhaled medications as you are taking them Continue Flonase and omeprazole as you are using them We will consider alternative asthma therapy as we go forward. Follow with Dr Lamonte Sakai in 3 months or sooner if you have any problems.

## 2017-06-12 NOTE — Patient Instructions (Signed)
Please take prednisone as directed until completely gone Continue your inhaled medications as you are taking them Continue Flonase and omeprazole as you are using them We will consider alternative asthma therapy as we go forward. Follow with Dr Lamonte Sakai in 3 months or sooner if you have any problems.

## 2017-06-13 LAB — IGE: IgE (Immunoglobulin E), Serum: 9 kU/L

## 2017-08-04 ENCOUNTER — Telehealth: Payer: Self-pay | Admitting: Emergency Medicine

## 2017-08-04 MED ORDER — BUDESONIDE-FORMOTEROL FUMARATE 160-4.5 MCG/ACT IN AERO: 2.0000 | INHALATION_SPRAY | Freq: Two times a day (BID) | RESPIRATORY_TRACT | 0 refills | Status: DC

## 2017-08-04 NOTE — Telephone Encounter (Signed)
Called and spoke with pt and she is aware of samples that have been left up front and she will come by to pick these up.  Nothing further is needed.

## 2017-08-09 ENCOUNTER — Other Ambulatory Visit: Payer: Self-pay | Admitting: Hematology & Oncology

## 2017-08-09 DIAGNOSIS — D573 Sickle-cell trait: Secondary | ICD-10-CM

## 2017-08-09 DIAGNOSIS — D473 Essential (hemorrhagic) thrombocythemia: Secondary | ICD-10-CM

## 2017-08-10 MED FILL — HYDROXYUREA 500 MG CAPSULE: 500 | 30 days supply | Qty: 90 | Fill #0

## 2017-08-16 ENCOUNTER — Ambulatory Visit (INDEPENDENT_AMBULATORY_CARE_PROVIDER_SITE_OTHER): Payer: Medicare Other | Admitting: Nurse Practitioner

## 2017-08-16 ENCOUNTER — Encounter: Payer: Self-pay | Admitting: Nurse Practitioner

## 2017-08-16 VITALS — BP 120/80 | HR 83 | Ht 62.0 in | Wt 180.8 lb

## 2017-08-16 DIAGNOSIS — R079 Chest pain, unspecified: Secondary | ICD-10-CM

## 2017-08-16 NOTE — Patient Instructions (Addendum)
We will be checking the following labs today - STAT Troponin   Medication Instructions:    Continue with your current medicines.     Testing/Procedures To Be Arranged:  Lexiscan Myoview - early next week  Follow-Up:   Will see how your stress test turns out and then decide about the follow up.     Other Special Instructions:   N/A    If you need a refill on your cardiac medications before your next appointment, please call your pharmacy.   Call the [place] office at [phone] if you have any questions, problems or concerns.
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
## 2017-08-17 ENCOUNTER — Telehealth (HOSPITAL_COMMUNITY): Payer: Self-pay | Admitting: *Deleted

## 2017-08-17 ENCOUNTER — Telehealth: Payer: Self-pay | Admitting: Nurse Practitioner

## 2017-08-17 LAB — TROPONIN T: Troponin T TROPT: <0.011 ng/mL (ref <0.011)

## 2017-08-17 NOTE — Telephone Encounter (Signed)
Called pt and left message for pt to call back for her Troponin test results.

## 2017-08-17 NOTE — Telephone Encounter (Signed)
Left message on voicemail in reference to upcoming appointment scheduled for 08/22/17. Phone number given for a call back so details instructions can be given.  [name]

## 2017-08-17 NOTE — Telephone Encounter (Signed)
-----   Message from Burtis Junes, NP sent at 08/17/2017  7:34 AM EDT ----- Ok to report. [name] troponin test was normal - this is reassuring.  Proceed with stress testing as discussed at [name] visit.

## 2017-08-18 ENCOUNTER — Telehealth: Payer: Self-pay | Admitting: *Deleted

## 2017-08-18 NOTE — Telephone Encounter (Signed)
-----   Message from Burtis Junes, NP sent at 08/17/2017  7:34 AM EDT ----- Ok to report. Her troponin test was normal - this is reassuring.  Proceed with stress testing as discussed at her visit.

## 2017-08-18 NOTE — Telephone Encounter (Signed)
Tried to reach py re: lab results.  Left her a message to call back.

## 2017-08-21 ENCOUNTER — Telehealth (HOSPITAL_COMMUNITY): Payer: Self-pay | Admitting: *Deleted

## 2017-08-21 NOTE — Telephone Encounter (Signed)
Patient given detailed instructions per Myocardial Perfusion Study Information Sheet for the test on 08/22/17 at 0715. Patient notified to arrive 15 minutes early and that it is imperative to arrive on time for appointment to keep from having the test rescheduled.  If you need to cancel or reschedule your appointment, please call the office within 24 hours of your appointment. . Patient verbalized understanding.Giovany Cosby, Ranae Palms

## 2017-08-22 ENCOUNTER — Ambulatory Visit (HOSPITAL_COMMUNITY): Payer: Medicare Other | Attending: Cardiology

## 2017-08-22 DIAGNOSIS — R079 Chest pain, unspecified: Secondary | ICD-10-CM | POA: Diagnosis present

## 2017-08-22 LAB — MYOCARDIAL PERFUSION IMAGING
LV dias vol: 60 mL (ref 46–106)
LV sys vol: 14 mL
Peak HR: 100 {beats}/min
RATE: 0.27
Rest HR: 75 {beats}/min
SDS: 0
SRS: 1
SSS: 1
TID: 0.67

## 2017-08-22 MED ORDER — TECHNETIUM TC 99M TETROFOSMIN IV KIT
10.8000 | PACK | Freq: Once | INTRAVENOUS | Status: AC | PRN
  Administered 2017-08-22: 10.8 via INTRAVENOUS
  Filled 2017-08-22: qty 11

## 2017-08-22 MED ORDER — REGADENOSON 0.4 MG/5ML IV SOLN
0.4000 mg | Freq: Once | INTRAVENOUS | Status: AC
  Administered 2017-08-22: 0.4 mg via INTRAVENOUS

## 2017-08-22 MED ORDER — TECHNETIUM TC 99M TETROFOSMIN IV KIT
33.0000 | PACK | Freq: Once | INTRAVENOUS | Status: AC | PRN
  Administered 2017-08-22: 33 via INTRAVENOUS
  Filled 2017-08-22: qty 33

## 2017-09-08 ENCOUNTER — Other Ambulatory Visit: Payer: Self-pay | Admitting: Emergency Medicine

## 2017-09-19 ENCOUNTER — Encounter: Payer: Self-pay | Admitting: Emergency Medicine

## 2017-09-19 ENCOUNTER — Ambulatory Visit (INDEPENDENT_AMBULATORY_CARE_PROVIDER_SITE_OTHER): Payer: Medicare Other | Admitting: Emergency Medicine

## 2017-09-19 DIAGNOSIS — J301 Allergic rhinitis due to pollen: Secondary | ICD-10-CM

## 2017-09-19 DIAGNOSIS — K219 Gastro-esophageal reflux disease without esophagitis: Secondary | ICD-10-CM | POA: Diagnosis not present

## 2017-09-19 DIAGNOSIS — J45909 Unspecified asthma, uncomplicated: Secondary | ICD-10-CM

## 2017-09-19 NOTE — Progress Notes (Signed)
  Subjective:    Patient ID: [name], female    DOB: May 04, 1946   MRN: [id number] HPI 71 -year-old woman with a history of asthma and vocal cord dysfunction both of which have been significantly impacted by severe esophageal reflux. Has a hx esophageal stricture dilations by Dr Henrene Pastor.   ROV 09/19/17 -- 71 year old woman with history of asthma (moderately severe obstruction with a vigorous bronchodilator response 02/03/17), vocal cord dysfunction with stridor and dyspnea, allergic rhinitis. Significant impact of her airways disease by GERD. At her last visit we treated her with prednisone for an apparent acute exacerbation. She is still having significant exertional SOB, can happen with walking. Her ability to do chores quite limited. She has a little bit of residual UA noise. She is on symbicort, seems to tolerate. Has been depending on samples. She is using albuterol nebs only during flares.   She underwent a normal myoview on 08/22/17.   ROS:  As per HPI.    Objective:   Physical Exam Vitals:   09/19/17 0913  BP: 122/70  Pulse: 89  SpO2: 99%  Weight: 180 lb (81.6 kg)  Height: 5\' 2"  (1.575 m)   Gen: Pleasant, overwt, in no distress,  normal affect,   ENT: No lesions,  mouth clear,  oropharynx clear, no postnasal drip  Neck: supple, no JVD, minimal   Lungs : no wheeze, good air movement, improved.   Cardiovascular: RRR, heart sounds normal, no murmur or gallops, no peripheral edema  Musculoskeletal: No deformities, no cyanosis or clubbing  Neuro: alert, non focal  Skin: Warm,     Assessment & Plan:   Intrinsic asthma Walking oximetry today on room air We will temporarily stop Symbicort. Please start Trelegy 1 inhalation once a day for the next several weeks to see if you benefit. Remember to rinse and gargle after using this medication. If you benefit from the change please call our office and let us know so that we can try to arrange to stay on it Uses albuterol  nebulized up to every 4 hours if needed for shortness of breath Flu shot up-to-date Follow with Dr Lamonte Sakai in 2 months or sooner if you have any problems.  Allergic rhinitis flonase  Esophageal reflux Omeprazole bid  Baltazar Apo, MD, PhD 09/19/2017, 9:39 AM [facility] [phone] or if no answer [phone]

## 2017-09-19 NOTE — Assessment & Plan Note (Signed)
flonase

## 2017-09-19 NOTE — Assessment & Plan Note (Signed)
Walking oximetry today on room air We will temporarily stop Symbicort. Please start Trelegy 1 inhalation once a day for the next several weeks to see if you benefit. Remember to rinse and gargle after using this medication. If you benefit from the change please call our office and let us know so that we can try to arrange to stay on it Uses albuterol nebulized up to every 4 hours if needed for shortness of breath Flu shot up-to-date Follow with Dr Lamonte Sakai in 2 months or sooner if you have any problems.

## 2017-09-19 NOTE — Progress Notes (Signed)
Patient seen in the office today and instructed on use of Trelegy Ellipta.  Patient expressed understanding and demonstrated technique.  

## 2017-09-19 NOTE — Assessment & Plan Note (Signed)
Omeprazole bid

## 2017-09-19 NOTE — Patient Instructions (Signed)
Walking oximetry today on room air We will temporarily stop Symbicort. Please start Trelegy 1 inhalation once a day for the next several weeks to see if you benefit. Remember to rinse and gargle after using this medication. If you benefit from the change please call our office and let us know so that we can try to arrange to stay on it Uses albuterol nebulized up to every 4 hours if needed for shortness of breath Flu shot up-to-date Follow with Dr Lamonte Sakai in 2 months or sooner if you have any problems.

## 2017-10-23 ENCOUNTER — Telehealth: Payer: Self-pay | Admitting: Emergency Medicine

## 2017-10-23 NOTE — Telephone Encounter (Signed)
ATC pt, no answer. Left message for pt to call back., 

## 2017-10-23 NOTE — Telephone Encounter (Signed)
Spoke with pt, she states she is having more SOB when she was taken off Symbicort and changed to Trelegy. There are no samples of Trelegy or Symbicort. I advised pt to cal her primary care physician to see if they had samples to give pt until she comes in to be seen. I also offered to send in Rx but she states she cannot afford the inhaler because it is too expensive. Nothing further is needed at this time.

## 2017-10-24 ENCOUNTER — Telehealth: Payer: Self-pay | Admitting: Emergency Medicine

## 2017-10-24 MED ORDER — BUDESONIDE-FORMOTEROL FUMARATE 160-4.5 MCG/ACT IN AERO: 2.0000 | INHALATION_SPRAY | Freq: Two times a day (BID) | RESPIRATORY_TRACT | 5 refills | Status: DC

## 2017-10-24 NOTE — Telephone Encounter (Signed)
Spoke with pt. She is requesting a refill on Symbicort. At her last OV, RB changed her to Trelegy but she doesn't feel like it's making a difference. Rx has been sent in for Symbicort. Nothing further was needed.

## 2017-10-27 MED FILL — HYDROXYUREA 500 MG CAPSULE: 500 | 30 days supply | Qty: 90 | Fill #1

## 2017-11-14 ENCOUNTER — Ambulatory Visit: Payer: Medicare Other | Admitting: Emergency Medicine

## 2017-11-16 ENCOUNTER — Encounter: Payer: Self-pay | Admitting: Emergency Medicine

## 2017-11-16 ENCOUNTER — Ambulatory Visit: Payer: Medicare Other | Admitting: Emergency Medicine

## 2017-11-16 DIAGNOSIS — J45909 Unspecified asthma, uncomplicated: Secondary | ICD-10-CM | POA: Diagnosis not present

## 2017-11-16 MED ORDER — PREDNISONE 10 MG PO TABS: ORAL_TABLET | ORAL | 0 refills | Status: DC

## 2017-11-16 MED ORDER — BENZONATATE 200 MG PO CAPS: 200.0000 mg | ORAL_CAPSULE | Freq: Four times a day (QID) | ORAL | 2 refills | Status: DC | PRN

## 2017-11-16 MED ORDER — HYDROCODONE-HOMATROPINE 5-1.5 MG/5ML PO SYRP: 5.0000 mL | ORAL_SOLUTION | Freq: Four times a day (QID) | ORAL | 0 refills | Status: DC | PRN

## 2017-11-16 NOTE — Patient Instructions (Signed)
Please continue Symbicort 2 puffs twice a day as you have been taking it.  Remember to rinse and gargle after using Use your albuterol nebulized up to every 4 hours if needed for shortness of breath, wheezing, chest tightness Take prednisone as directed until completely gone Use Hycodan 5 cc up to every 6 hours if needed for cough suppression Use Tessalon Perles 200mg  up to every 6 hours if needed for cough.  Follow with Dr Lamonte Sakai in 4 months or sooner if you have any problems.

## 2017-11-16 NOTE — Assessment & Plan Note (Signed)
With an acute exacerbation in the setting of a recent upper respiratory infection.  She has significant wheezing, coughing, dyspnea.  We will treat with a prednisone taper, cough suppression.  She did not benefit from Trelegy, we will continue Symbicort and albuterol as needed  Please continue Symbicort 2 puffs twice a day as you have been taking it.  Remember to rinse and gargle after using Use your albuterol nebulized up to every 4 hours if needed for shortness of breath, wheezing, chest tightness Take prednisone as directed until completely gone Use Hycodan 5 cc up to every 6 hours if needed for cough suppression Use Tessalon Perles 200mg  up to every 6 hours if needed for cough.  Follow with Dr Lamonte Sakai in 4 months or sooner if you have any problems.

## 2017-11-16 NOTE — Addendum Note (Signed)
Addended by: Lorretta Harp on: 11/16/2017 12:35 PM   Modules accepted: Orders

## 2017-11-16 NOTE — Progress Notes (Signed)
  Subjective:    Patient ID: [name], female    DOB: 06-02-1946   MRN: [id number] HPI 71 -year-old woman with a history of asthma and vocal cord dysfunction both of which have been significantly impacted by severe esophageal reflux. Has a hx esophageal stricture dilations by Dr Henrene Pastor.   ROV 09/19/17 -- 71 year old woman with history of asthma (moderately severe obstruction with a vigorous bronchodilator response 02/03/17), vocal cord dysfunction with stridor and dyspnea, allergic rhinitis. Significant impact of her airways disease by GERD. At her last visit we treated her with prednisone for an apparent acute exacerbation. She is still having significant exertional SOB, can happen with walking. Her ability to do chores quite limited. She has a little bit of residual UA noise. She is on symbicort, seems to tolerate. Has been depending on samples. She is using albuterol nebs only during flares.   She underwent a normal myoview on 08/22/17.   ROV 11/16/17 --this is a follow-up visit for chronic persistent asthma and vocal cord dysfunction, both exacerbated by significant allergic rhinitis and GERD.  She averages several acute exacerbations annually.  At her last visit we tried changing her Symbicort to Trelegy to see if she would benefit. She went back to Symbicort, felt that it worked better.  She is here today with increased dyspnea, cough prod of minimal white. Started about 2.5 wks ago with URI sx. She is using nebs frequently.  Flu shot up to date.   ROS:  As per HPI.    Objective:   Physical Exam Vitals:   11/16/17 1203  BP: 130/78  Pulse: 96  SpO2: 100%  Weight: 177 lb (80.3 kg)  Height: 5\' 2"  (1.575 m)   Gen: Pleasant, overwt, in no distress,  normal affect, frequently coughing  ENT: No lesions,  mouth clear,  oropharynx clear, no postnasal drip  Neck: supple, no significant stridor  Lungs : Loud bilateral diffuse expiratory wheezes, all fields  Cardiovascular: RRR, heart  sounds normal, no murmur or gallops, no peripheral edema  Musculoskeletal: No deformities, no cyanosis or clubbing  Neuro: alert, non focal  Skin: Warm, no rash    Assessment & Plan:   Intrinsic asthma With an acute exacerbation in the setting of a recent upper respiratory infection.  She has significant wheezing, coughing, dyspnea.  We will treat with a prednisone taper, cough suppression.  She did not benefit from Trelegy, we will continue Symbicort and albuterol as needed  Please continue Symbicort 2 puffs twice a day as you have been taking it.  Remember to rinse and gargle after using Use your albuterol nebulized up to every 4 hours if needed for shortness of breath, wheezing, chest tightness Take prednisone as directed until completely gone Use Hycodan 5 cc up to every 6 hours if needed for cough suppression Use Tessalon Perles 200mg  up to every 6 hours if needed for cough.  Follow with Dr Lamonte Sakai in 4 months or sooner if you have any problems.  Baltazar Apo, MD, PhD 11/16/2017, 12:26 PM [facility] [phone] or if no answer [phone]

## 2017-11-22 ENCOUNTER — Other Ambulatory Visit: Payer: Self-pay

## 2017-11-22 NOTE — Patient Outreach (Signed)
[facility]) Care Management  11/22/2017  ORIAN AMBERG [birth year] [id number]   Medication Adherence call to Mrs. Leyna Vanderkolk patient is showing past due under [facility] Ins.on Pravastatin  40 mg patient did not answer , I call [facility] they said patient already pick up medication on 11/18/17 for a three month supply.patient will be due on March 2019.  [facility] Management Direct Dial [phone]  Fax [phone] [email or web address]

## 2017-11-24 ENCOUNTER — Other Ambulatory Visit (HOSPITAL_BASED_OUTPATIENT_CLINIC_OR_DEPARTMENT_OTHER): Payer: Medicare Other

## 2017-11-24 ENCOUNTER — Encounter: Payer: Self-pay | Admitting: Hematology & Oncology

## 2017-11-24 ENCOUNTER — Ambulatory Visit (HOSPITAL_BASED_OUTPATIENT_CLINIC_OR_DEPARTMENT_OTHER): Payer: Medicare Other | Admitting: Hematology & Oncology

## 2017-11-24 ENCOUNTER — Other Ambulatory Visit: Payer: Self-pay

## 2017-11-24 VITALS — BP 134/68 | HR 84 | Temp 98.2°F | Resp 16 | Wt 183.0 lb

## 2017-11-24 DIAGNOSIS — D473 Essential (hemorrhagic) thrombocythemia: Secondary | ICD-10-CM

## 2017-11-24 DIAGNOSIS — D573 Sickle-cell trait: Secondary | ICD-10-CM | POA: Diagnosis not present

## 2017-11-24 DIAGNOSIS — Z1589 Genetic susceptibility to other disease: Secondary | ICD-10-CM

## 2017-11-24 LAB — CHCC SATELLITE - SMEAR

## 2017-11-24 LAB — CMP (CANCER CENTER ONLY)
ALBUMIN: 2.9 g/dL — AB (ref 3.3–5.5)
ALT(SGPT): 18 U/L (ref 10–47)
AST: 15 U/L (ref 11–38)
Alkaline Phosphatase: 70 U/L (ref 26–84)
BUN, Bld: 12 mg/dL (ref 7–22)
CALCIUM: 9.1 mg/dL (ref 8.0–10.3)
CHLORIDE: 104 meq/L (ref 98–108)
CO2: 27 meq/L (ref 18–33)
Creat: 1 mg/dl (ref 0.6–1.2)
Glucose, Bld: 123 mg/dL — ABNORMAL HIGH (ref 73–118)
POTASSIUM: 3.7 meq/L (ref 3.3–4.7)
Sodium: 145 mEq/L (ref 128–145)
TOTAL PROTEIN: 6.1 g/dL — AB (ref 6.4–8.1)
Total Bilirubin: 0.4 mg/dl (ref 0.20–1.60)

## 2017-11-24 LAB — IRON AND TIBC
%SAT: 25 % (ref 21–57)
IRON: 55 ug/dL (ref 41–142)
TIBC: 216 ug/dL — AB (ref 236–444)
UIBC: 161 ug/dL (ref 120–384)

## 2017-11-24 LAB — CBC WITH DIFFERENTIAL (CANCER CENTER ONLY)
BASO#: 0.1 10*3/uL (ref 0.0–0.2)
BASO%: 0.6 % (ref 0.0–2.0)
EOS%: 1.8 % (ref 0.0–7.0)
Eosinophils Absolute: 0.2 10*3/uL (ref 0.0–0.5)
HCT: 37.8 % (ref 34.8–46.6)
HGB: 12.4 g/dL (ref 11.6–15.9)
LYMPH#: 1.9 10*3/uL (ref 0.9–3.3)
LYMPH%: 22.8 % (ref 14.0–48.0)
MCH: 24.5 pg — ABNORMAL LOW (ref 26.0–34.0)
MCHC: 32.8 g/dL (ref 32.0–36.0)
MCV: 75 fL — ABNORMAL LOW (ref 81–101)
MONO#: 0.6 10*3/uL (ref 0.1–0.9)
MONO%: 7.5 % (ref 0.0–13.0)
NEUT#: 5.7 10*3/uL (ref 1.5–6.5)
NEUT%: 67.3 % (ref 39.6–80.0)
PLATELETS: 786 10*3/uL — AB (ref 145–400)
RBC: 5.07 10*6/uL (ref 3.70–5.32)
RDW: 16.7 % — AB (ref 11.1–15.7)
WBC: 8.4 10*3/uL (ref 3.9–10.0)

## 2017-11-24 LAB — FERRITIN: Ferritin: 59 ng/ml (ref 9–269)

## 2017-11-24 LAB — TECHNOLOGIST REVIEW CHCC SATELLITE: Tech Review: 1
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
## 2017-11-25 LAB — RETICULOCYTES: Reticulocyte Count: 1.6 % (ref 0.6–2.6)

## 2018-01-04 ENCOUNTER — Ambulatory Visit: Payer: Medicare Other | Admitting: Adult Health

## 2018-01-04 ENCOUNTER — Ambulatory Visit (INDEPENDENT_AMBULATORY_CARE_PROVIDER_SITE_OTHER)
Admission: RE | Admit: 2018-01-04 | Discharge: 2018-01-04 | Disposition: A | Discharge status: Home / Self Care | Payer: Medicare Other | Source: Ambulatory Visit | Attending: Adult Health | Admitting: Adult Health

## 2018-01-04 ENCOUNTER — Encounter: Payer: Self-pay | Admitting: Adult Health

## 2018-01-04 DIAGNOSIS — K219 Gastro-esophageal reflux disease without esophagitis: Secondary | ICD-10-CM | POA: Diagnosis not present

## 2018-01-04 DIAGNOSIS — J301 Allergic rhinitis due to pollen: Secondary | ICD-10-CM

## 2018-01-04 DIAGNOSIS — J4551 Severe persistent asthma with (acute) exacerbation: Secondary | ICD-10-CM

## 2018-01-04 MED ORDER — PREDNISONE 10 MG PO TABS: ORAL_TABLET | ORAL | 0 refills | Status: DC

## 2018-01-04 MED ORDER — BENZONATATE 200 MG PO CAPS: 200.0000 mg | ORAL_CAPSULE | Freq: Three times a day (TID) | ORAL | 3 refills | Status: DC | PRN

## 2018-01-04 MED ORDER — LEVALBUTEROL HCL 0.63 MG/3ML IN NEBU
0.6300 mg | INHALATION_SOLUTION | Freq: Once | RESPIRATORY_TRACT | Status: AC
  Administered 2018-01-04: 0.63 mg via RESPIRATORY_TRACT

## 2018-01-04 MED ORDER — BUDESONIDE-FORMOTEROL FUMARATE 160-4.5 MCG/ACT IN AERO: 2.0000 | INHALATION_SPRAY | Freq: Two times a day (BID) | RESPIRATORY_TRACT | 0 refills | Status: DC

## 2018-01-04 MED ORDER — AZITHROMYCIN 250 MG PO TABS: ORAL_TABLET | ORAL | 0 refills | Status: AC

## 2018-01-04 NOTE — Patient Instructions (Signed)
Z-Pak take as directed Prednisone taper take as directed  Mucinex DM twice daily as needed for cough and congestion Tessalon as needed 3 times daily for cough Symbicort 2 puffs twice daily rinse after use. Chest x-ray today Albuterol as needed for wheezing and shortness of breath Patient assistance papers completed for Symbicort follow up with Dr. Lamonte Sakai  In 2-3 months and As needed   Please contact office for sooner follow up if symptoms do not improve or worsen or seek emergency care

## 2018-01-04 NOTE — Progress Notes (Signed)
@Patient  ID: [name], female    DOB: [birth year], 72 y.o.   MRN: [id number]  Chief Complaint  Patient presents with  . Acute Visit    Asthma     Referring provider: Merrilee Seashore, MD  HPI: 72 year old female former smoker followed for asthma and vocal cord dysfunction complicated by gastroesophageal reflux.  And allergic rhinitis  history of esophageal stricture  TEST   01/04/2018 Acute OV : Asthma  Patient presents for an acute office visit.  Patient complains that she is having persistent asthma symptoms with cough shortness of breath and wheezing for last 6 weeks.  She is have increased her albuterol use.  Patient was seen 6 weeks ago for an asthma exacerbation.  She was given a prednisone taper.  Patient says she never really got much better. Continued to have cough, thick congestion , wheezing and shortness of breath .  We contacted her pharmacy Symbicort was last filled November 2018. She says she can afford the co pay for her inhalers. She is on fixed income and does not have the extra money.  No fever, chest pain, orthopnea, hemoptysis , edema.  Taking mucinex and tessalon .    Allergies  Allergen Reactions  . Penicillins Itching and Rash    Immunization History  Administered Date(s) Administered  . Influenza Split 11/10/2011, 10/10/2012  . Influenza Whole 10/19/2006, 09/03/2008, 10/05/2009, 09/07/2010  . Influenza, High Dose Seasonal PF 09/05/2017  . Influenza,inj,Quad PF,6+ Mos 09/11/2013, 08/08/2014, 09/14/2015, 09/05/2016  . Pneumococcal Polysaccharide-23 03/01/2006  . Td 03/20/2009  . Zoster 10/19/2006    Past Medical History:  Diagnosis Date  . Allergic rhinitis   . Anemia   . Asthma   . Chronic bronchitis ([place])   . Colonic polyp   . DDD (degenerative disc disease), lumbar 07/20/2011  . DJD (degenerative joint disease) of knee    bilateral  . DJD (degenerative joint disease), lumbar 07/20/2011  . Esophageal stricture   . GERD  (gastroesophageal reflux disease)   . Hiatal hernia   . Hyperlipidemia   . Osteoporosis   . Stroke [facility])    "didn't know I'd had one before I was told; didn't do any damage" (11/24/2016)  . Vocal cord dysfunction     Tobacco History: Social History   Tobacco Use  Smoking Status Former Smoker  . Packs/day: 0.10  . Years: 30.00  . Pack years: 3.00  . Types: Cigarettes  . Start date: 01/31/1975  . Last attempt to quit: 08/09/1995  . Years since quitting: 22.4  Smokeless Tobacco Never Used   Counseling given: Not Answered   Outpatient Encounter Medications as of 01/04/2018  Medication Sig  . albuterol (PROVENTIL HFA) 108 (90 Base) MCG/ACT inhaler Inhale 2 puffs into the lungs every 4 (four) hours as needed for wheezing or shortness of breath. Reported on 12/30/2015  . albuterol (PROVENTIL) (2.5 MG/3ML) 0.083% nebulizer solution Take 2.5 mg by nebulization every 4 (four) hours as needed for shortness of breath. Reported on 12/30/2015  . aspirin EC 81 MG tablet Take 81 mg by mouth daily.  . benzonatate (TESSALON) 200 MG capsule Take 1 capsule (200 mg total) by mouth every 6 (six) hours as needed for cough.  . budesonide-formoterol (SYMBICORT) 160-4.5 MCG/ACT inhaler Inhale 2 puffs into the lungs 2 (two) times daily.  . cholecalciferol (VITAMIN D) 1000 UNITS tablet Take 1,000 Units by mouth daily.  . clonazePAM (KLONOPIN) 0.5 MG tablet TAKE 1 TABLET BY MOUTH TWICE DAILY AS NEEDED FOR ANXIETY  .  fluticasone (FLONASE) 50 MCG/ACT nasal spray Place 1 spray into the nose 2 (two) times daily.   . folic acid (FOLVITE) 1 MG tablet Take 1 tablet (1 mg total) by mouth daily.  . hydroxyurea (HYDREA) 500 MG capsule Take 2 capsules (1,000 mg total) by mouth 2 (two) times daily.  . Magnesium 200 MG TABS Take 1 tablet by mouth 2 (two) times daily.   . NON FORMULARY Take 1 capsule by mouth every morning. BLACK COHASH  . omeprazole (PRILOSEC) 20 MG capsule TAKE 1 TABLET TWICE A DAY.  . pravastatin  (PRAVACHOL) 40 MG tablet Take 40 mg by mouth daily.  Marland Kitchen rOPINIRole (REQUIP) 1 MG tablet Take 1.5 mg by mouth at bedtime.  . sertraline (ZOLOFT) 50 MG tablet Take 50 mg by mouth daily.  Marland Kitchen azithromycin (ZITHROMAX Z-PAK) 250 MG tablet Take 2 tablets (500 mg) on  Day 1,  followed by 1 tablet (250 mg) once daily on Days 2 through 5.  . predniSONE (DELTASONE) 10 MG tablet 4 tabs for 2 days, then 3 tabs for 2 days, 2 tabs for 2 days, then 1 tab for 2 days, then stop  . [DISCONTINUED] Fluticasone-Umeclidin-Vilant (TRELEGY ELLIPTA) 100-62.5-25 MCG/INH AEPB Inhale 1 puff into the lungs daily.  . [DISCONTINUED] HYDROcodone-homatropine (HYCODAN) 5-1.5 MG/5ML syrup Take 5 mLs by mouth every 6 (six) hours as needed for cough. (Patient not taking: Reported on 01/04/2018)  . [DISCONTINUED] predniSONE (DELTASONE) 10 MG tablet Take 40mg  for 3 days, then 30mg  for 3 days, 20mg  for 3 days, 10mg  for 3 days, then stop (Patient not taking: Reported on 01/04/2018)   No facility-administered encounter medications on file as of 01/04/2018.      Review of Systems  Constitutional:   No  weight loss, night sweats,  Fevers, chills, fatigue, or  lassitude.  HEENT:   No headaches,  Difficulty swallowing,  Tooth/dental problems, or  Sore throat,                No sneezing, itching, ear ache,  +nasal congestion, post nasal drip,   CV:  No chest pain,  Orthopnea, PND, swelling in lower extremities, anasarca, dizziness, palpitations, syncope.   GI  No heartburn, indigestion, abdominal pain, nausea, vomiting, diarrhea, change in bowel habits, loss of appetite, bloody stools.   Resp:  No chest wall deformity  Skin: no rash or lesions.  GU: no dysuria, change in color of urine, no urgency or frequency.  No flank pain, no hematuria   MS:  No joint pain or swelling.  No decreased range of motion.  No back pain.    Physical Exam  BP 130/78 (BP Location: Left Arm, Cuff Size: Normal)   Pulse 83   Ht 5\' 2"  (1.575 m)   Wt 175  lb 6.4 oz (79.6 kg)   SpO2 93%   BMI 32.08 kg/m   GEN: A/Ox3; pleasant , NAD, well nourished    HEENT:  [place]/AT,  EACs-clear, TMs-wnl, NOSE-clear, THROAT-clear, no lesions, no postnasal drip or exudate noted.   NECK:  Supple w/ fair ROM; no JVD; normal carotid impulses w/o bruits; no thyromegaly or nodules palpated; no lymphadenopathy.    RESP  Scattered rhonchi , speaks in full sentences ,  no accessory muscle use, no dullness to percussion  CARD:  RRR, no m/r/g, no peripheral edema, pulses intact, no cyanosis or clubbing.  GI:   Soft & nt; nml bowel sounds; no organomegaly or masses detected.   Musco: Warm bil, no deformities or joint  swelling noted.   Neuro: alert, no focal deficits noted.    Skin: Warm, no lesions or rashes    Lab Results:   BNP   Imaging: No results found.   Assessment & Plan:   Asthma exacerbation Recurrent asthma exacerbation w/ medication non compliance due to finances Pt assistance papers for Symbicort filled out.  xopenex neb .  Zpack /PRed taper  Check chest xray   Plan  Patient Instructions  Z-Pak take as directed Prednisone taper take as directed  Mucinex DM twice daily as needed for cough and congestion Tessalon as needed 3 times daily for cough Symbicort 2 puffs twice daily rinse after use. Chest x-ray today Albuterol as needed for wheezing and shortness of breath Patient assistance papers completed for Symbicort follow up with Dr. Lamonte Sakai  In 2-3 months and As needed   Please contact office for sooner follow up if symptoms do not improve or worsen or seek emergency care       Allergic rhinitis Control for triggers   Esophageal reflux Control for triggers  GERD tx/diet      Rexene Edison, NP 01/04/2018

## 2018-01-04 NOTE — Assessment & Plan Note (Signed)
Control for triggers  GERD tx/diet

## 2018-01-04 NOTE — Addendum Note (Signed)
Addended by: Parke Poisson E on: 01/04/2018 11:57 AM   Modules accepted: Orders

## 2018-01-04 NOTE — Assessment & Plan Note (Signed)
Recurrent asthma exacerbation w/ medication non compliance due to finances Pt assistance papers for Symbicort filled out.  xopenex neb .  Zpack /PRed taper  Check chest xray   Plan  Patient Instructions  Z-Pak take as directed Prednisone taper take as directed  Mucinex DM twice daily as needed for cough and congestion Tessalon as needed 3 times daily for cough Symbicort 2 puffs twice daily rinse after use. Chest x-ray today Albuterol as needed for wheezing and shortness of breath Patient assistance papers completed for Symbicort follow up with Dr. Lamonte Sakai  In 2-3 months and As needed   Please contact office for sooner follow up if symptoms do not improve or worsen or seek emergency care

## 2018-01-04 NOTE — Progress Notes (Signed)
Chart and office note reviewed in detail  > agree with a/p as outlined    

## 2018-01-04 NOTE — Assessment & Plan Note (Signed)
Control for triggers  

## 2018-01-05 ENCOUNTER — Telehealth: Payer: Self-pay | Admitting: Emergency Medicine

## 2018-01-05 ENCOUNTER — Other Ambulatory Visit: Payer: Self-pay

## 2018-01-05 ENCOUNTER — Inpatient Hospital Stay: Payer: Medicare Other

## 2018-01-05 ENCOUNTER — Encounter: Payer: Self-pay | Admitting: Hematology & Oncology

## 2018-01-05 ENCOUNTER — Inpatient Hospital Stay: Payer: Medicare Other | Attending: Hematology & Oncology | Admitting: Hematology & Oncology

## 2018-01-05 VITALS — BP 113/62 | HR 91 | Temp 98.2°F | Wt 177.8 lb

## 2018-01-05 DIAGNOSIS — D473 Essential (hemorrhagic) thrombocythemia: Secondary | ICD-10-CM

## 2018-01-05 DIAGNOSIS — Z7982 Long term (current) use of aspirin: Secondary | ICD-10-CM | POA: Diagnosis not present

## 2018-01-05 DIAGNOSIS — J45909 Unspecified asthma, uncomplicated: Secondary | ICD-10-CM | POA: Insufficient documentation

## 2018-01-05 DIAGNOSIS — Z79899 Other long term (current) drug therapy: Secondary | ICD-10-CM | POA: Insufficient documentation

## 2018-01-05 DIAGNOSIS — D573 Sickle-cell trait: Secondary | ICD-10-CM | POA: Insufficient documentation

## 2018-01-05 LAB — IRON AND TIBC
Iron: 41 ug/dL (ref 41–142)
SATURATION RATIOS: 18 % — AB (ref 21–57)
TIBC: 228 ug/dL — AB (ref 236–444)
UIBC: 187 ug/dL

## 2018-01-05 LAB — CBC WITH DIFFERENTIAL (CANCER CENTER ONLY)
Basophils Absolute: 0 10*3/uL (ref 0.0–0.1)
Basophils Relative: 0 %
EOS ABS: 0 10*3/uL (ref 0.0–0.5)
Eosinophils Relative: 0 %
HEMATOCRIT: 39.1 % (ref 34.8–46.6)
HEMOGLOBIN: 12.9 g/dL (ref 11.6–15.9)
LYMPHS ABS: 0.8 10*3/uL — AB (ref 0.9–3.3)
LYMPHS PCT: 7 %
MCH: 24.3 pg — ABNORMAL LOW (ref 26.0–34.0)
MCHC: 33 g/dL (ref 32.0–36.0)
MCV: 73.6 fL — AB (ref 81.0–101.0)
Monocytes Absolute: 0.1 10*3/uL (ref 0.1–0.9)
Monocytes Relative: 1 %
NEUTROS ABS: 10.2 10*3/uL — AB (ref 1.5–6.5)
NEUTROS PCT: 92 %
Platelet Count: 847 10*3/uL — ABNORMAL HIGH (ref 145–400)
RBC: 5.31 MIL/uL (ref 3.70–5.32)
RDW: 18.6 % — ABNORMAL HIGH (ref 11.1–15.7)
WBC: 11.1 10*3/uL — AB (ref 3.9–10.0)

## 2018-01-05 LAB — LACTATE DEHYDROGENASE: LDH: 331 U/L — AB (ref 125–245)

## 2018-01-05 LAB — FERRITIN: Ferritin: 70 ng/mL (ref 9–269)

## 2018-01-05 NOTE — Telephone Encounter (Signed)
Pt is returning call. Cb is [phone].

## 2018-01-05 NOTE — Telephone Encounter (Signed)
Advised pt of results. Pt understood and nothing further is needed.   

## 2018-01-05 NOTE — Telephone Encounter (Signed)
Notes recorded by Melvenia Needles, NP on 01/04/2018 at 4:58 PM EST CXR is ok  No sign of PNA  Cont w/ ov recs Please contact office for sooner follow up if symptoms do not improve or worsen or seek emergency care   ATC pt, no answer. Left message for pt to call back.

## 2018-01-05 NOTE — Progress Notes (Signed)
Hematology and Oncology Follow Up Visit  [name] [id number] [birth year] 72 y.o. 01/05/2018   Principle Diagnosis:  Essential thrombocythemia-JAK2 POSITIVE Sickle cell trait  Current Therapy:   Hydrea 1,000 mg PO TID -dose changed on 18/29/9371 Folic acid 1 mg by mouth daily Aspirin 81 mg daily    Interim History:  [name] is here today for follow-up.  She is doing okay.  Unfortunately, she is having some issues with her asthma.  She had a chest x-ray done yesterday.  Thankfully, there is no pneumonia.  She has some bronchitic changes.  She has been started on some antibiotics and some prednisone.  She has had no fever.  She has had no bleeding.  She has had no nausea or vomiting.  There is been no change in bowel or bladder habits.  She has had no rashes.  There is been no leg swelling.  She does complain of some tingling in her feet.  She is taking baby aspirin daily.  Currently, her performance status is ECOG 1.   Medications:  Allergies as of 01/05/2018      Reactions   Penicillins Itching, Rash      Medication List        Accurate as of 01/05/18  8:41 AM. Always use your most recent med list.          albuterol (2.5 MG/3ML) 0.083% nebulizer solution Commonly known as:  PROVENTIL Take 2.5 mg by nebulization every 4 (four) hours as needed for shortness of breath. Reported on 12/30/2015   albuterol 108 (90 Base) MCG/ACT inhaler Commonly known as:  PROVENTIL HFA Inhale 2 puffs into the lungs every 4 (four) hours as needed for wheezing or shortness of breath. Reported on 12/30/2015   aspirin EC 81 MG tablet Take 81 mg by mouth daily.   azithromycin 250 MG tablet Commonly known as:  ZITHROMAX Z-PAK Take 2 tablets (500 mg) on  Day 1,  followed by 1 tablet (250 mg) once daily on Days 2 through 5.   benzonatate 200 MG capsule Commonly known as:  TESSALON Take 1 capsule (200 mg total) by mouth 3 (three) times daily as needed for cough.     budesonide-formoterol 160-4.5 MCG/ACT inhaler Commonly known as:  SYMBICORT Inhale 2 puffs into the lungs 2 (two) times daily.   budesonide-formoterol 160-4.5 MCG/ACT inhaler Commonly known as:  SYMBICORT Inhale 2 puffs into the lungs 2 (two) times daily.   cholecalciferol 1000 units tablet Commonly known as:  VITAMIN D Take 1,000 Units by mouth daily.   clonazePAM 0.5 MG tablet Commonly known as:  KLONOPIN TAKE 1 TABLET BY MOUTH TWICE DAILY AS NEEDED FOR ANXIETY   fluticasone 50 MCG/ACT nasal spray Commonly known as:  FLONASE Place 1 spray into the nose 2 (two) times daily.   folic acid 1 MG tablet Commonly known as:  FOLVITE Take 1 tablet (1 mg total) by mouth daily.   hydroxyurea 500 MG capsule Commonly known as:  HYDREA Take 2 capsules (1,000 mg total) by mouth 2 (two) times daily.   Magnesium 200 MG Tabs Take 1 tablet by mouth 2 (two) times daily.   NON FORMULARY Take 1 capsule by mouth every morning. BLACK COHASH   omeprazole 20 MG capsule Commonly known as:  PRILOSEC TAKE 1 TABLET TWICE A DAY.   pravastatin 40 MG tablet Commonly known as:  PRAVACHOL Take 40 mg by mouth daily.   predniSONE 10 MG tablet Commonly known as:  DELTASONE 4 tabs for  2 days, then 3 tabs for 2 days, 2 tabs for 2 days, then 1 tab for 2 days, then stop   rOPINIRole 1 MG tablet Commonly known as:  REQUIP Take 1.5 mg by mouth at bedtime.   sertraline 50 MG tablet Commonly known as:  ZOLOFT Take 50 mg by mouth daily.       Allergies:  Allergies  Allergen Reactions  . Penicillins Itching and Rash    Past Medical History, Surgical history, Social history, and Family History were reviewed and updated.  Review of Systems: Review of Systems  Constitutional: Negative.   Eyes: Negative.   Respiratory: Positive for shortness of breath and wheezing.   Cardiovascular: Negative.   Gastrointestinal: Negative.   Genitourinary: Negative.   Musculoskeletal: Negative.   Skin:  Negative.   Neurological: Positive for tingling.  Endo/Heme/Allergies: Negative.   Psychiatric/Behavioral: Negative.      Physical Exam:  weight is 177 lb 12.8 oz (80.6 kg). Her oral temperature is 98.2 F (36.8 C). Her blood pressure is 113/62 and her pulse is 91. Her oxygen saturation is 100%.   Wt Readings from Last 3 Encounters:  01/05/18 177 lb 12.8 oz (80.6 kg)  01/04/18 175 lb 6.4 oz (79.6 kg)  11/24/17 183 lb (83 kg)    Physical Exam  Constitutional: She is oriented to person, place, and time.  HENT:  Head: Normocephalic and atraumatic.  Mouth/Throat: Oropharynx is clear and moist.  Eyes: EOM are normal. Pupils are equal, round, and reactive to light.  Neck: Normal range of motion.  Cardiovascular: Normal rate, regular rhythm and normal heart sounds.  Pulmonary/Chest: Effort normal and breath sounds normal.  Abdominal: Soft. Bowel sounds are normal.  Musculoskeletal: Normal range of motion. She exhibits no edema, tenderness or deformity.  Lymphadenopathy:    She has no cervical adenopathy.  Neurological: She is alert and oriented to person, place, and time.  Skin: Skin is warm and dry. No rash noted. No erythema.  Psychiatric: She has a normal mood and affect. Her behavior is normal. Judgment and thought content normal.  Vitals reviewed.   Lab Results  Component Value Date   WBC 11.1 (H) 01/05/2018   HGB 12.4 11/24/2017   HCT 39.1 01/05/2018   MCV 73.6 (L) 01/05/2018   PLT 847 (H) 01/05/2018   Lab Results  Component Value Date   FERRITIN 59 11/24/2017   IRON 55 11/24/2017   TIBC 216 (L) 11/24/2017   UIBC 161 11/24/2017   IRONPCTSAT 25 11/24/2017   Lab Results  Component Value Date   RETICCTPCT 1.4 08/06/2015   RBC 5.31 01/05/2018   RETICCTABS 73.1 08/06/2015   No results found for: KPAFRELGTCHN, LAMBDASER, KAPLAMBRATIO No results found for: IGGSERUM, IGA, IGMSERUM No results found for: Odetta Pink, SPEI   Chemistry      Component Value Date/Time   NA 145 11/24/2017 0754   NA 142 05/25/2017 0755   K 3.7 11/24/2017 0754   K 3.9 05/25/2017 0755   CL 104 11/24/2017 0754   CO2 27 11/24/2017 0754   CO2 24 05/25/2017 0755   BUN 12 11/24/2017 0754   BUN 13.3 05/25/2017 0755   CREATININE 1.0 11/24/2017 0754   CREATININE 0.8 05/25/2017 0755      Component Value Date/Time   CALCIUM 9.1 11/24/2017 0754   CALCIUM 9.5 05/25/2017 0755   ALKPHOS 70 11/24/2017 0754   ALKPHOS 74 05/25/2017 0755   AST 15 11/24/2017 0754   AST  21 05/25/2017 0755   ALT 18 11/24/2017 0754   ALT 14 05/25/2017 0755   BILITOT 0.40 11/24/2017 0754   BILITOT 0.37 05/25/2017 0755      Impression and Plan: Ms. Duling is a very pleasant 72 yo African American female with essential thrombocythemia, JAK2 positive.   I will increase the dose of Hydrea to 500 mg p.o. 3 times daily.  Hopefully, this will bring the platelet count back down.  I will plan to have her come back in 6 more weeks.  We will have to see how her platelet count looks.  If we do not not see a significant drop in her platelet count, we may have to make a change to anagrelide.  Although she is JAK 2+, I do not think we have to start [facility] as of yet.  I think that the side effects would outweigh the benefits.  [name], MD 2/1/20198:41 AM

## 2018-01-11 MED FILL — HYDROXYUREA 500 MG CAPSULE: 500 | 30 days supply | Qty: 90 | Fill #2

## 2018-01-18 IMAGING — CR DG SHOULDER 2+V*R*
4 series · 4 of 4 positions shown · non-contrast
Comparison: A chest x-ray of September 17, 25 teen

CLINICAL DATA: 3-4 months of right shoulder pain with difficulty
lifting the arm.

EXAM:
RIGHT SHOULDER - 2+ VIEW

[shoulder grashey]
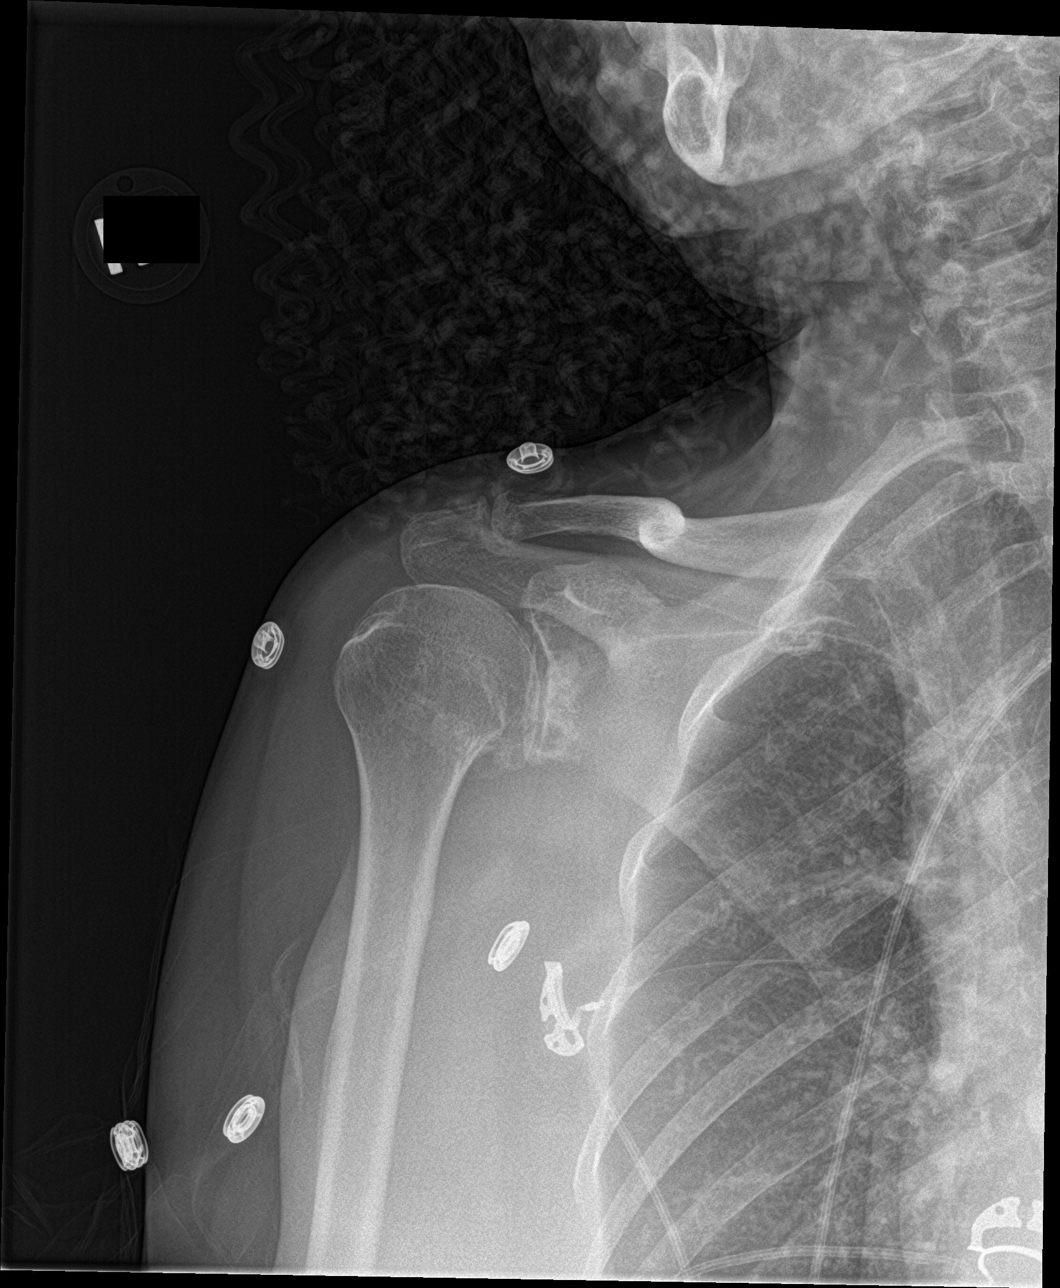

[shoulder y view (1 of 2)]
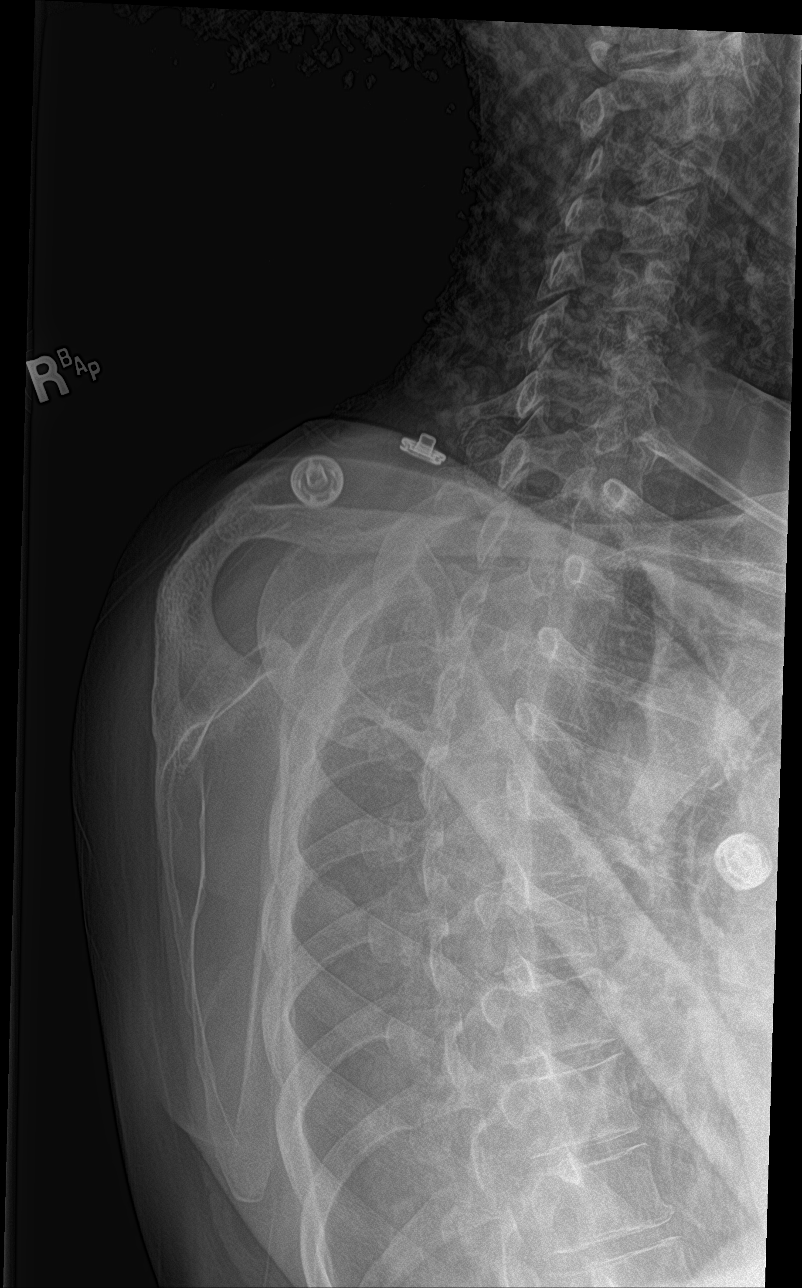

[shoulder axillary]
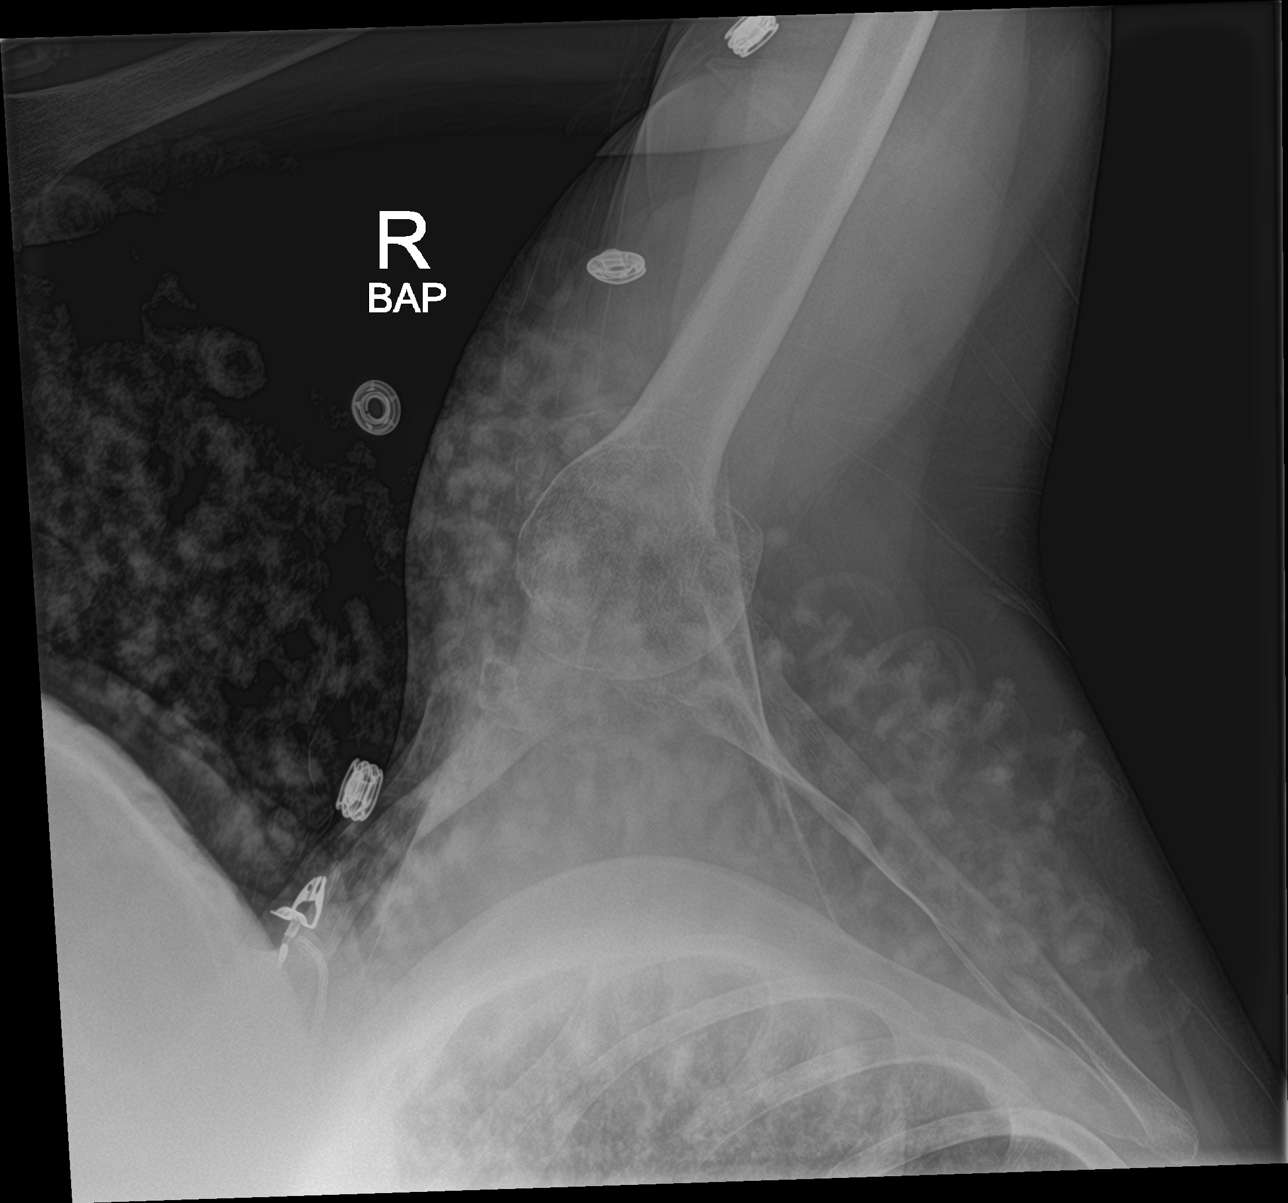

[shoulder y view (2 of 2)]
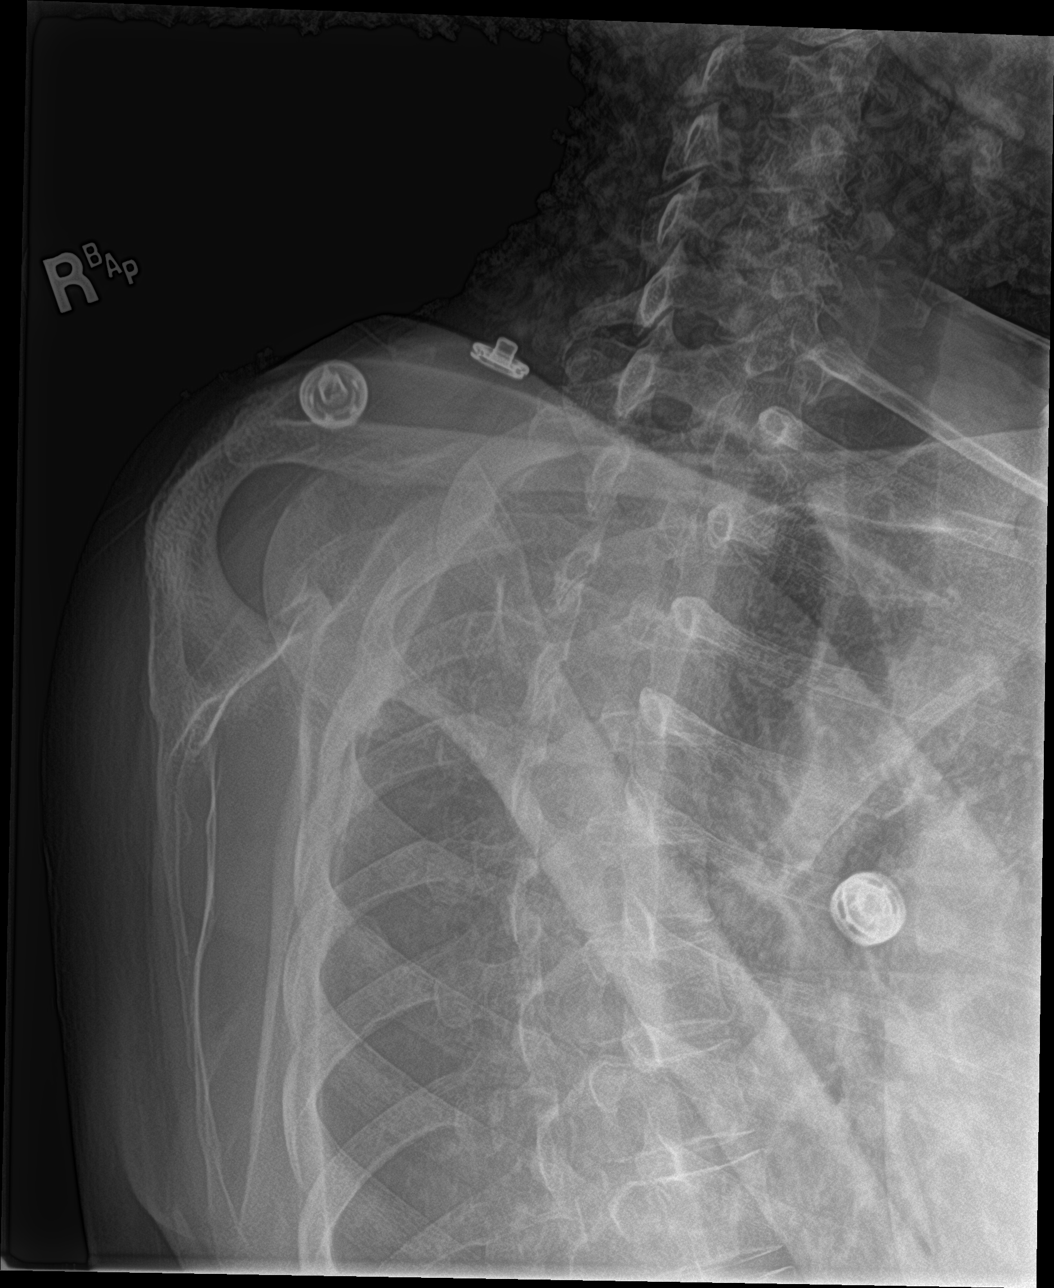

[4 of 4 positions shown; findings below may reference images not displayed]

FINDINGS: There is moderate to severe degenerative change of the glenohumeral
joint. There is subarticular reactive change in the glenoid. There
is some flattening of the extreme medial aspect of the articular
surface of the humeral head. There is a prominent osteophyte arising
from the inferior articular margin of the humeral head. There are
degenerative changes of the AC joint. The subacromial subdeltoid
space is grossly normal. The observed portions of the right clavicle
and upper right ribs are normal.
IMPRESSION: Moderate to severe osteoarthritic change of the glenohumeral joint
with moderate changes of the AC joint. There is no acute bony
abnormality.

## 2018-02-16 ENCOUNTER — Ambulatory Visit: Payer: Medicare Other | Admitting: Hematology & Oncology

## 2018-02-16 ENCOUNTER — Other Ambulatory Visit: Payer: Medicare Other

## 2018-03-02 ENCOUNTER — Other Ambulatory Visit: Payer: Self-pay

## 2018-03-02 ENCOUNTER — Inpatient Hospital Stay (HOSPITAL_BASED_OUTPATIENT_CLINIC_OR_DEPARTMENT_OTHER): Payer: Medicare Other | Admitting: Hematology & Oncology

## 2018-03-02 ENCOUNTER — Inpatient Hospital Stay: Payer: Medicare Other | Attending: Hematology & Oncology

## 2018-03-02 ENCOUNTER — Encounter: Payer: Self-pay | Admitting: Hematology & Oncology

## 2018-03-02 DIAGNOSIS — D573 Sickle-cell trait: Secondary | ICD-10-CM

## 2018-03-02 DIAGNOSIS — Z7982 Long term (current) use of aspirin: Secondary | ICD-10-CM | POA: Diagnosis not present

## 2018-03-02 DIAGNOSIS — D473 Essential (hemorrhagic) thrombocythemia: Secondary | ICD-10-CM

## 2018-03-02 DIAGNOSIS — Z79899 Other long term (current) drug therapy: Secondary | ICD-10-CM | POA: Diagnosis not present

## 2018-03-02 DIAGNOSIS — Z1589 Genetic susceptibility to other disease: Secondary | ICD-10-CM

## 2018-03-02 LAB — CMP (CANCER CENTER ONLY)
ALT: 19 U/L (ref 10–47)
AST: 18 U/L (ref 11–38)
Albumin: 3.4 g/dL — ABNORMAL LOW (ref 3.5–5.0)
Alkaline Phosphatase: 67 U/L (ref 26–84)
Anion gap: 9 (ref 5–15)
BUN: 14 mg/dL (ref 7–22)
CHLORIDE: 106 mmol/L (ref 98–108)
CO2: 29 mmol/L (ref 18–33)
Calcium: 9.2 mg/dL (ref 8.0–10.3)
Creatinine: 1.1 mg/dL (ref 0.60–1.20)
GLUCOSE: 128 mg/dL — AB (ref 73–118)
POTASSIUM: 3.8 mmol/L (ref 3.3–4.7)
SODIUM: 144 mmol/L (ref 128–145)
Total Bilirubin: 0.7 mg/dL (ref 0.2–1.6)
Total Protein: 6.7 g/dL (ref 6.4–8.1)

## 2018-03-02 LAB — CBC WITH DIFFERENTIAL (CANCER CENTER ONLY)
Basophils Absolute: 0 10*3/uL (ref 0.0–0.1)
Basophils Relative: 1 %
EOS PCT: 2 %
Eosinophils Absolute: 0.1 10*3/uL (ref 0.0–0.5)
HCT: 39.8 % (ref 34.8–46.6)
Hemoglobin: 13.2 g/dL (ref 11.6–15.9)
LYMPHS ABS: 1.5 10*3/uL (ref 0.9–3.3)
LYMPHS PCT: 21 %
MCH: 24.9 pg — ABNORMAL LOW (ref 26.0–34.0)
MCHC: 33.2 g/dL (ref 32.0–36.0)
MCV: 75 fL — ABNORMAL LOW (ref 81.0–101.0)
MONO ABS: 0.6 10*3/uL (ref 0.1–0.9)
Monocytes Relative: 8 %
Neutro Abs: 4.8 10*3/uL (ref 1.5–6.5)
Neutrophils Relative %: 68 %
PLATELETS: 509 10*3/uL — AB (ref 145–400)
RBC: 5.31 MIL/uL (ref 3.70–5.32)
RDW: 19.3 % — ABNORMAL HIGH (ref 11.1–15.7)
WBC: 7.1 10*3/uL (ref 3.9–10.0)

## 2018-03-02 LAB — SAVE SMEAR

## 2018-03-02 LAB — LACTATE DEHYDROGENASE: LDH: 264 U/L — AB (ref 125–245)

## 2018-03-02 MED ORDER — HYDROXYUREA 500 MG PO CAPS: 500.0000 mg | ORAL_CAPSULE | Freq: Three times a day (TID) | ORAL | 6 refills | Status: DC

## 2018-03-02 MED FILL — HYDROXYUREA 500 MG CAPSULE: 500 | 30 days supply | Qty: 90 | Fill #3

## 2018-03-02 NOTE — Progress Notes (Signed)
Hematology and Oncology Follow Up Visit  [name] [id number] 08-09-1946 72 y.o. 03/02/2018   Principle Diagnosis:  Essential thrombocythemia-JAK2 POSITIVE Sickle cell trait  Current Therapy:   Hydrea 500 mg PO TID -dose changed on 76/19/5093 Folic acid 1 mg by mouth daily Aspirin 81 mg daily    Interim History:  [name] is here today for follow-up.  She is doing better.  She is feeling better.  She actually is going to [place] this afternoon with her church for a revival.  We increased her Hydrea dose to 3 times daily dosing.  She is done well with this.  She feels good with this.  Her platelet count has responded quite nicely.  She has had no nausea or vomiting.  She is had no bleeding.  She is had no diarrhea.  She has had no cough or shortness of breath.   Currently, her performance status is ECOG 1.   Medications:  Allergies as of 03/02/2018      Reactions   Penicillins Itching, Rash      Medication List        Accurate as of 03/02/18  1:04 PM. Always use your most recent med list.          albuterol (2.5 MG/3ML) 0.083% nebulizer solution Commonly known as:  PROVENTIL Take 2.5 mg by nebulization every 4 (four) hours as needed for shortness of breath. Reported on 12/30/2015   albuterol 108 (90 Base) MCG/ACT inhaler Commonly known as:  PROVENTIL HFA Inhale 2 puffs into the lungs every 4 (four) hours as needed for wheezing or shortness of breath. Reported on 12/30/2015   aspirin EC 81 MG tablet Take 81 mg by mouth daily.   benzonatate 200 MG capsule Commonly known as:  TESSALON Take 1 capsule (200 mg total) by mouth 3 (three) times daily as needed for cough.   budesonide-formoterol 160-4.5 MCG/ACT inhaler Commonly known as:  SYMBICORT Inhale 2 puffs into the lungs 2 (two) times daily.   cholecalciferol 1000 units tablet Commonly known as:  VITAMIN D Take 1,000 Units by mouth daily.   clonazePAM 0.5 MG tablet Commonly known as:  KLONOPIN TAKE 1  TABLET BY MOUTH TWICE DAILY AS NEEDED FOR ANXIETY   fluticasone 50 MCG/ACT nasal spray Commonly known as:  FLONASE Place 1 spray into the nose 2 (two) times daily.   folic acid 1 MG tablet Commonly known as:  FOLVITE Take 1 tablet (1 mg total) by mouth daily.   hydroxyurea 500 MG capsule Commonly known as:  HYDREA Take 2 capsules (1,000 mg total) by mouth 2 (two) times daily.   Magnesium 200 MG Tabs Take 1 tablet by mouth 2 (two) times daily.   NON FORMULARY Take 1 capsule by mouth every morning. BLACK COHASH   omeprazole 20 MG capsule Commonly known as:  PRILOSEC TAKE 1 TABLET TWICE A DAY.   pravastatin 40 MG tablet Commonly known as:  PRAVACHOL Take 40 mg by mouth daily.   rOPINIRole 1 MG tablet Commonly known as:  REQUIP Take 1.5 mg by mouth at bedtime.   sertraline 50 MG tablet Commonly known as:  ZOLOFT Take 50 mg by mouth daily.       Allergies:  Allergies  Allergen Reactions  . Penicillins Itching and Rash    Past Medical History, Surgical history, Social history, and Family History were reviewed and updated.  Review of Systems: Review of Systems  Constitutional: Negative.   Eyes: Negative.   Respiratory: Positive for shortness  of breath and wheezing.   Cardiovascular: Negative.   Gastrointestinal: Negative.   Genitourinary: Negative.   Musculoskeletal: Negative.   Skin: Negative.   Neurological: Positive for tingling.  Endo/Heme/Allergies: Negative.   Psychiatric/Behavioral: Negative.      Physical Exam:  weight is 180 lb (81.6 kg). Her oral temperature is 98.3 F (36.8 C). Her blood pressure is 125/78 and her pulse is 94. Her respiration is 16 and oxygen saturation is 100%.   Wt Readings from Last 3 Encounters:  03/02/18 180 lb (81.6 kg)  01/05/18 177 lb 12.8 oz (80.6 kg)  01/04/18 175 lb 6.4 oz (79.6 kg)    Physical Exam  Constitutional: She is oriented to person, place, and time.  HENT:  Head: Normocephalic and atraumatic.    Mouth/Throat: Oropharynx is clear and moist.  Eyes: Pupils are equal, round, and reactive to light. EOM are normal.  Neck: Normal range of motion.  Cardiovascular: Normal rate, regular rhythm and normal heart sounds.  Pulmonary/Chest: Effort normal and breath sounds normal.  Abdominal: Soft. Bowel sounds are normal.  Musculoskeletal: Normal range of motion. She exhibits no edema, tenderness or deformity.  Lymphadenopathy:    She has no cervical adenopathy.  Neurological: She is alert and oriented to person, place, and time.  Skin: Skin is warm and dry. No rash noted. No erythema.  Psychiatric: She has a normal mood and affect. Her behavior is normal. Judgment and thought content normal.  Vitals reviewed.   Lab Results  Component Value Date   WBC 7.1 03/02/2018   HGB 12.4 11/24/2017   HCT 39.8 03/02/2018   MCV 75.0 (L) 03/02/2018   PLT 509 (H) 03/02/2018   Lab Results  Component Value Date   FERRITIN 70 01/05/2018   IRON 41 01/05/2018   TIBC 228 (L) 01/05/2018   UIBC 187 01/05/2018   IRONPCTSAT 18 (L) 01/05/2018   Lab Results  Component Value Date   RETICCTPCT 1.4 08/06/2015   RBC 5.31 03/02/2018   RETICCTABS 73.1 08/06/2015   No results found for: KPAFRELGTCHN, LAMBDASER, KAPLAMBRATIO No results found for: IGGSERUM, IGA, IGMSERUM No results found for: Odetta Pink, SPEI   Chemistry      Component Value Date/Time   NA 145 11/24/2017 0754   NA 142 05/25/2017 0755   K 3.7 11/24/2017 0754   K 3.9 05/25/2017 0755   CL 104 11/24/2017 0754   CO2 27 11/24/2017 0754   CO2 24 05/25/2017 0755   BUN 12 11/24/2017 0754   BUN 13.3 05/25/2017 0755   CREATININE 1.0 11/24/2017 0754   CREATININE 0.8 05/25/2017 0755      Component Value Date/Time   CALCIUM 9.1 11/24/2017 0754   CALCIUM 9.5 05/25/2017 0755   ALKPHOS 70 11/24/2017 0754   ALKPHOS 74 05/25/2017 0755   AST 15 11/24/2017 0754   AST 21 05/25/2017 0755   ALT  18 11/24/2017 0754   ALT 14 05/25/2017 0755   BILITOT 0.40 11/24/2017 0754   BILITOT 0.37 05/25/2017 0755      Impression and Plan: [name] is a very pleasant 72 yo African American female with essential thrombocythemia, JAK2 positive.   Given that she is responding, we will keep her on Hydrea.  I will plan to get her back in 2 more months.  If her lately count continues to respond, then maybe we can decrease the dose of the Hydrea.  I know that she will have a very nice Easter holiday.  [name], MD 3/29/20191:04 PM

## 2018-03-05 LAB — IRON AND TIBC
Iron: 111 ug/dL (ref 41–142)
SATURATION RATIOS: 41 % (ref 21–57)
TIBC: 270 ug/dL (ref 236–444)
UIBC: 159 ug/dL

## 2018-03-05 LAB — FERRITIN: Ferritin: 41 ng/mL (ref 9–269)

## 2018-03-16 ENCOUNTER — Ambulatory Visit: Payer: Medicare Other | Admitting: Emergency Medicine

## 2018-03-16 ENCOUNTER — Encounter: Payer: Self-pay | Admitting: Emergency Medicine

## 2018-03-16 DIAGNOSIS — J45909 Unspecified asthma, uncomplicated: Secondary | ICD-10-CM

## 2018-03-16 DIAGNOSIS — K219 Gastro-esophageal reflux disease without esophagitis: Secondary | ICD-10-CM | POA: Diagnosis not present

## 2018-03-16 DIAGNOSIS — J301 Allergic rhinitis due to pollen: Secondary | ICD-10-CM

## 2018-03-16 MED ORDER — BUDESONIDE-FORMOTEROL FUMARATE 160-4.5 MCG/ACT IN AERO: 2.0000 | INHALATION_SPRAY | Freq: Two times a day (BID) | RESPIRATORY_TRACT | 0 refills | Status: DC

## 2018-03-16 NOTE — Progress Notes (Signed)
  Subjective:    Patient ID: [name], female    DOB: 1946/10/31   MRN: [id number] HPI 72 -year-old woman with a history of asthma and vocal cord dysfunction both of which have been significantly impacted by severe esophageal reflux. Has a hx esophageal stricture dilations by Dr Henrene Pastor.   ROV 09/19/17 -- 72 year old woman with history of asthma (moderately severe obstruction with a vigorous bronchodilator response 02/03/17), vocal cord dysfunction with stridor and dyspnea, allergic rhinitis. Significant impact of her airways disease by GERD. At her last visit we treated her with prednisone for an apparent acute exacerbation. She is still having significant exertional SOB, can happen with walking. Her ability to do chores quite limited. She has a little bit of residual UA noise. She is on symbicort, seems to tolerate. Has been depending on samples. She is using albuterol nebs only during flares.   She underwent a normal myoview on 08/22/17.   ROV 11/16/17 --this is a follow-up visit for chronic persistent asthma and vocal cord dysfunction, both exacerbated by significant allergic rhinitis and GERD.  She averages several acute exacerbations annually.  At her last visit we tried changing her Symbicort to Trelegy to see if she would benefit. She went back to Symbicort, felt that it worked better.  She is here today with increased dyspnea, cough prod of minimal white. Started about 2.5 wks ago with URI sx. She is using nebs frequently.  Flu shot up to date.   ROV 03/16/18 -- Pleasant 72 yo woman with a hx mild to moderate persistent asthma and vocal cord dysfunction. Hx of GERD and allergic rhinitis which effect both problems.  She was treated for an acute exacerbation in January 2019 with azithromycin and prednisone.  She was having more dyspnea last week, some cough, wheeze. She believes that the pollen impacted her. Currently managed on Symbicort 2 puffs twice a day, fluticasone nasal spray bid,  omeprazole 20 mg twice a day. Averages albuterol about once a day. Good compliance w Symbicort. She is under eval for possible RA by rheumatology. Hasn;t started any therapy yet because she has an equivocal quantGOLD.   ROS:  As per HPI.    Objective:   Physical Exam Vitals:   03/16/18 0857 03/16/18 0858  BP:  130/80  Pulse:  65  SpO2:  100%  Weight: 181 lb (82.1 kg)   Height: 5\' 2"  (1.575 m)    Gen: Pleasant, overwt, in no distress,  normal affect, rare cough  ENT: No lesions,  mouth clear,  oropharynx clear, no postnasal drip, strong voice today  Neck: supple, no significant stridor  Lungs : no wheeze today  Cardiovascular: RRR, heart sounds normal, no murmur or gallops, no peripheral edema  Musculoskeletal: No deformities, no cyanosis or clubbing  Neuro: alert, non focal  Skin: Warm, no rash    Assessment & Plan:   Intrinsic asthma Recent acute exacerbation in January, possibly in the setting of an upper respiratory infection.  She is improved now.  We will compensated on her current Symbicort, allergy and GERD control.  She is using albuterol about once a day.  Allergic rhinitis Continue fluticasone nasal spray twice a day  Esophageal reflux Continue omeprazole 20 mg twice a day  Baltazar Apo, MD, PhD 03/16/2018, 9:18 AM [facility] [phone] or if no answer [phone]

## 2018-03-16 NOTE — Patient Instructions (Signed)
Please continue Symbicort 2 puffs twice a day.  Remember to rinse and gargle after using. Keep albuterol available to use either 2 puffs or 1 nebulizer treatment up to every 4 hours as needed for shortness of breath, cough, chest tightness. Continue your fluticasone nasal spray twice a day Continue your omeprazole 20 mg twice a day Agree with follow-up with rheumatology.  Please keep [place] updated on which medications are started. Follow with Dr Lamonte Sakai in 4 months or sooner if you have any problems.

## 2018-03-16 NOTE — Assessment & Plan Note (Signed)
Continue fluticasone nasal spray twice a day

## 2018-03-16 NOTE — Assessment & Plan Note (Signed)
Recent acute exacerbation in January, possibly in the setting of an upper respiratory infection.  She is improved now.  We will compensated on her current Symbicort, allergy and GERD control.  She is using albuterol about once a day.

## 2018-03-16 NOTE — Assessment & Plan Note (Signed)
Continue omeprazole 20 mg twice a day

## 2018-03-28 NOTE — Progress Notes (Signed)
Subjective:    Patient ID: [name], female    DOB: 06-23-1946, 72 y.o.   MRN: [id number]  Chief Complaint  Patient presents with  . New Patient (Initial Visit)    HPI:  [name] is a 72 y.o. female who presents today for initial evaluation of indeterminate Quantiferon Gold result.   [name] has a history of inflammatory arthritis likely being RA and has been followed by Dr. Lahoma Rocker of Rheumatology. She was most recently found to have erosion in her hand x-ray and in combination with the positive CCP antibody recommends the next course of treatment with methotrexate. Routine screening for TB was completed and she was found to have an indeterminate Quantiferon Gold test. She has been referred to ID prior to initiation of methotrexate to confirm she does not have latent tuberculosis.   Describes having sweats throughout the day but denies cough, fevers, chills, or weight loss. She has never been in prison or confined settings. She has not traveled outside the country. No known contacts with TB.   Allergies  Allergen Reactions  . Penicillins Itching and Rash    Outpatient Medications Prior to Visit  Medication Sig Dispense Refill  . albuterol (PROVENTIL HFA) 108 (90 Base) MCG/ACT inhaler Inhale 2 puffs into the lungs every 4 (four) hours as needed for wheezing or shortness of breath. Reported on 12/30/2015 1 Inhaler 6  . albuterol (PROVENTIL) (2.5 MG/3ML) 0.083% nebulizer solution Take 2.5 mg by nebulization every 4 (four) hours as needed for shortness of breath. Reported on 12/30/2015    . aspirin EC 81 MG tablet Take 81 mg by mouth daily.    . benzonatate (TESSALON) 200 MG capsule Take 1 capsule (200 mg total) by mouth 3 (three) times daily as needed for cough. 45 capsule 3  . budesonide-formoterol (SYMBICORT) 160-4.5 MCG/ACT inhaler Inhale 2 puffs into the lungs 2 (two) times daily. 1 Inhaler 0  . budesonide-formoterol (SYMBICORT) 160-4.5 MCG/ACT inhaler Inhale 2  puffs into the lungs 2 (two) times daily for 28 days. 2 Inhaler 0  . cholecalciferol (VITAMIN D) 1000 UNITS tablet Take 1,000 Units by mouth daily.    . clonazePAM (KLONOPIN) 0.5 MG tablet TAKE 1 TABLET BY MOUTH TWICE DAILY AS NEEDED FOR ANXIETY  0  . fluticasone (FLONASE) 50 MCG/ACT nasal spray Place 1 spray into the nose 2 (two) times daily.     . folic acid (FOLVITE) 1 MG tablet Take 1 tablet (1 mg total) by mouth daily. 90 tablet 3  . hydroxyurea (HYDREA) 500 MG capsule Take 1 capsule (500 mg total) by mouth 3 (three) times daily before meals. 90 capsule 6  . Magnesium 200 MG TABS Take 1 tablet by mouth 2 (two) times daily.     . NON FORMULARY Take 1 capsule by mouth every morning. BLACK COHASH    . omeprazole (PRILOSEC) 20 MG capsule TAKE 1 TABLET TWICE A DAY. 180 capsule 1  . pravastatin (PRAVACHOL) 40 MG tablet Take 40 mg by mouth daily.    Marland Kitchen rOPINIRole (REQUIP) 1 MG tablet Take 1.5 mg by mouth at bedtime.    . sertraline (ZOLOFT) 50 MG tablet Take 50 mg by mouth daily.    . traMADol (ULTRAM) 50 MG tablet Take by mouth every 6 (six) hours as needed.     No facility-administered medications prior to visit.      Past Medical History:  Diagnosis Date  . Allergic rhinitis   . Anemia   .  Asthma   . Chronic bronchitis ([place])   . Colonic polyp   . DDD (degenerative disc disease), lumbar 07/20/2011  . DJD (degenerative joint disease) of knee    bilateral  . DJD (degenerative joint disease), lumbar 07/20/2011  . Esophageal stricture   . GERD (gastroesophageal reflux disease)   . Hiatal hernia   . Hyperlipidemia   . Osteoporosis   . Stroke [facility])    "didn't know I'd had one before I was told; didn't do any damage" (11/24/2016)  . Vocal cord dysfunction       Past Surgical History:  Procedure Laterality Date  . CARPAL TUNNEL RELEASE Bilateral   . COLONOSCOPY W/ BIOPSIES AND POLYPECTOMY    . ESOPHAGOGASTRODUODENOSCOPY (EGD) WITH ESOPHAGEAL DILATION    . FOOT SURGERY Bilateral     "might have been bunions or hammertoes"  . KNEE ARTHROSCOPY Left 11/06/2013  . TOTAL ABDOMINAL HYSTERECTOMY    . VIDEO BRONCHOSCOPY Bilateral 12/12/2014   Procedure: VIDEO BRONCHOSCOPY WITHOUT FLUORO;  Surgeon: Chesley Mires, MD;  Location: [facility] ENDOSCOPY;  Service: Cardiopulmonary;  Laterality: Bilateral;      Family History  Problem Relation Age of Onset  . Heart attack Mother   . Colon cancer Father 93  . Cancer Sister   . Asthma Brother   . Asthma Unknown        uncle  . Stomach cancer Neg Hx   . Esophageal cancer Neg Hx   . Rectal cancer Neg Hx       Social History   Socioeconomic History  . Marital status: Widowed    Spouse name: Not on file  . Number of children: Not on file  . Years of education: Not on file  . Highest education level: Not on file  Occupational History  . Occupation: retired  Scientific laboratory technician  . Financial resource strain: Not on file  . Food insecurity:    Worry: Not on file    Inability: Not on file  . Transportation needs:    Medical: Not on file    Non-medical: Not on file  Tobacco Use  . Smoking status: Former Smoker    Packs/day: 0.10    Years: 30.00    Pack years: 3.00    Types: Cigarettes    Start date: 01/31/1975    Last attempt to quit: 08/09/1995    Years since quitting: 22.6  . Smokeless tobacco: Never Used  Substance and Sexual Activity  . Alcohol use: No    Alcohol/week: 0.0 oz  . Drug use: No  . Sexual activity: Never  Lifestyle  . Physical activity:    Days per week: Not on file    Minutes per session: Not on file  . Stress: Not on file  Relationships  . Social connections:    Talks on phone: Not on file    Gets together: Not on file    Attends religious service: Not on file    Active member of club or organization: Not on file    Attends meetings of clubs or organizations: Not on file    Relationship status: Not on file  . Intimate partner violence:    Fear of current or ex partner: Not on file    Emotionally abused:  Not on file    Physically abused: Not on file    Forced sexual activity: Not on file  Other Topics Concern  . Not on file  Social History Narrative  . Not on file     Review of  Systems  Constitutional: Negative for chills, diaphoresis, fatigue, fever and unexpected weight change.  HENT: Negative for congestion.   Respiratory: Positive for wheezing. Negative for cough, chest tightness and shortness of breath.   Neurological: Negative for headaches.       Objective:    BP 121/73   Pulse 80   Temp 98 F (36.7 C) (Oral)   Wt 186 lb (84.4 kg)   BMI 34.02 kg/m  Nursing note and vital signs reviewed.  Physical Exam  Constitutional: She is oriented to person, place, and time. She appears well-developed and well-nourished. No distress.  Cardiovascular: Normal rate, regular rhythm, normal heart sounds and intact distal pulses. Exam reveals no gallop and no friction rub.  No murmur heard. Pulmonary/Chest: Effort normal. No stridor. No respiratory distress. She has wheezes. She has no rales. She exhibits no tenderness.  Neurological: She is alert and oriented to person, place, and time.  Skin: Skin is warm and dry.  Psychiatric: She has a normal mood and affect. Her behavior is normal. Judgment and thought content normal.        Assessment & Plan:   Problem List Items Addressed This Visit      Other   Abnormal laboratory test - Primary    Ms. Patchen had an indeterminate Quantiferon Gold during most recent screening as she is getting ready to start methotrexate. She appears to be very low risk and has no signs of active TB. Her previous chest x-ray was done about 3 months ago with no evidence of TB. I will recheck her Quantiferon today. If negative then no further follow up is needed.       Relevant Orders   QuantiFERON-TB Gold Plus       I am having [name] maintain her fluticasone, aspirin EC, pravastatin, albuterol, cholecalciferol, Magnesium, NON FORMULARY,  clonazePAM, folic acid, sertraline, albuterol, rOPINIRole, omeprazole, benzonatate, budesonide-formoterol, hydroxyurea, budesonide-formoterol, and traMADol.   Follow-up:  Pending blood work results.    Mauricio Po, [place]

## 2018-03-29 ENCOUNTER — Encounter: Payer: Self-pay | Admitting: Family

## 2018-03-29 ENCOUNTER — Ambulatory Visit: Payer: Medicare Other | Admitting: Family

## 2018-03-29 VITALS — BP 121/73 | HR 80 | Temp 98.0°F | Wt 186.0 lb

## 2018-03-29 DIAGNOSIS — R899 Unspecified abnormal finding in specimens from other organs, systems and tissues: Secondary | ICD-10-CM | POA: Diagnosis not present

## 2018-03-29 NOTE — Assessment & Plan Note (Signed)
[name] had an indeterminate Quantiferon Gold during most recent screening as she is getting ready to start methotrexate. She appears to be very low risk and has no signs of active TB. Her previous chest x-ray was done about 3 months ago with no evidence of TB. I will recheck her Quantiferon today. If negative then no further follow up is needed.

## 2018-03-29 NOTE — Patient Instructions (Signed)
Nice to meet you!  We will recheck your Quantiferon test for TB.   Follow up will be dependent upon blood work results.

## 2018-03-31 LAB — QUANTIFERON-TB GOLD PLUS
MITOGEN-NIL: 2.35 [IU]/mL
NIL: 0.02 IU/mL
QuantiFERON-TB Gold Plus: NEGATIVE
TB1-NIL: 0 [IU]/mL
TB2-NIL: <0 IU/mL

## 2018-04-09 MED FILL — HYDROXYUREA 500 MG CAPSULE: 500 | 30 days supply | Qty: 90 | Fill #4

## 2018-04-26 ENCOUNTER — Telehealth: Payer: Self-pay | Admitting: Behavioral Health

## 2018-04-26 NOTE — Telephone Encounter (Signed)
-----   Message from [place], [place] sent at 04/26/2018 12:17 PM EDT ----- Please inform patient that her Quantiferon gold was negative. Therefore ok to start treatment. I apologize for the delay in results as I thought I provided them to her previously.

## 2018-04-26 NOTE — Telephone Encounter (Signed)
Called patient, left a message for her to return the call.  Did not leave any information on voicemail just the office call back number.   Pricilla Riffle RN

## 2018-04-27 ENCOUNTER — Inpatient Hospital Stay: Payer: Medicare Other | Attending: Hematology & Oncology | Admitting: Hematology & Oncology

## 2018-04-27 ENCOUNTER — Other Ambulatory Visit: Payer: Self-pay

## 2018-04-27 ENCOUNTER — Encounter: Payer: Self-pay | Admitting: Hematology & Oncology

## 2018-04-27 ENCOUNTER — Inpatient Hospital Stay: Payer: Medicare Other

## 2018-04-27 VITALS — BP 118/60 | HR 75 | Temp 97.9°F | Resp 18 | Wt 177.0 lb

## 2018-04-27 DIAGNOSIS — D473 Essential (hemorrhagic) thrombocythemia: Secondary | ICD-10-CM | POA: Insufficient documentation

## 2018-04-27 DIAGNOSIS — D573 Sickle-cell trait: Secondary | ICD-10-CM

## 2018-04-27 DIAGNOSIS — Z1589 Genetic susceptibility to other disease: Secondary | ICD-10-CM

## 2018-04-27 DIAGNOSIS — Z7982 Long term (current) use of aspirin: Secondary | ICD-10-CM | POA: Insufficient documentation

## 2018-04-27 DIAGNOSIS — Z79899 Other long term (current) drug therapy: Secondary | ICD-10-CM | POA: Diagnosis not present

## 2018-04-27 LAB — IRON AND TIBC
IRON: 105 ug/dL (ref 41–142)
Saturation Ratios: 47 % (ref 21–57)
TIBC: 224 ug/dL — AB (ref 236–444)
UIBC: 119 ug/dL

## 2018-04-27 LAB — CBC WITH DIFFERENTIAL (CANCER CENTER ONLY)
BASOS PCT: 1 %
Basophils Absolute: 0.1 10*3/uL (ref 0.0–0.1)
EOS ABS: 0.1 10*3/uL (ref 0.0–0.5)
EOS PCT: 2 %
HCT: 37.4 % (ref 34.8–46.6)
Hemoglobin: 12.3 g/dL (ref 11.6–15.9)
Lymphocytes Relative: 27 %
Lymphs Abs: 1.1 10*3/uL (ref 0.9–3.3)
MCH: 25.7 pg — ABNORMAL LOW (ref 26.0–34.0)
MCHC: 32.9 g/dL (ref 32.0–36.0)
MCV: 78.1 fL — ABNORMAL LOW (ref 81.0–101.0)
MONO ABS: 0.2 10*3/uL (ref 0.1–0.9)
MONOS PCT: 5 %
Neutro Abs: 2.7 10*3/uL (ref 1.5–6.5)
Neutrophils Relative %: 65 %
PLATELETS: 372 10*3/uL (ref 145–400)
RBC: 4.79 MIL/uL (ref 3.70–5.32)
RDW: 19.2 % — AB (ref 11.1–15.7)
WBC Count: 4.2 10*3/uL (ref 3.9–10.0)

## 2018-04-27 LAB — CMP (CANCER CENTER ONLY)
ALT: 12 U/L (ref 0–55)
AST: 25 U/L (ref 5–34)
Albumin: 3.6 g/dL (ref 3.5–5.0)
Alkaline Phosphatase: 60 U/L (ref 40–150)
Anion gap: 11 (ref 3–11)
BILIRUBIN TOTAL: 0.4 mg/dL (ref 0.2–1.2)
BUN: 11 mg/dL (ref 7–26)
CHLORIDE: 103 mmol/L (ref 98–109)
CO2: 24 mmol/L (ref 22–29)
Calcium: 9.3 mg/dL (ref 8.4–10.4)
Creatinine: 0.89 mg/dL (ref 0.60–1.10)
Glucose, Bld: 79 mg/dL (ref 70–140)
POTASSIUM: 3.9 mmol/L (ref 3.5–5.1)
Sodium: 138 mmol/L (ref 136–145)
TOTAL PROTEIN: 6.6 g/dL (ref 6.4–8.3)

## 2018-04-27 LAB — RETICULOCYTES
RBC.: 4.79 MIL/uL (ref 3.70–5.45)
RETIC CT PCT: 0.8 % (ref 0.7–2.1)
Retic Count, Absolute: 38.3 10*3/uL (ref 33.7–90.7)

## 2018-04-27 LAB — FERRITIN: FERRITIN: 90 ng/mL (ref 9–269)

## 2018-04-27 LAB — TECHNOLOGIST SMEAR REVIEW

## 2018-04-27 LAB — LACTATE DEHYDROGENASE: LDH: 317 U/L — AB (ref 125–245)

## 2018-04-27 NOTE — Telephone Encounter (Signed)
Called patient, left a message for her to return the call.  Did not leave any information on voicemail just the office call back number.   Pricilla Riffle RN

## 2018-04-27 NOTE — Progress Notes (Signed)
Hematology and Oncology Follow Up Visit  KEYANA GUEVARA [id number] [birth year] 72 y.o. 04/27/2018   Principle Diagnosis:  Essential thrombocythemia-JAK2 POSITIVE Sickle cell trait  Current Therapy:   Hydrea 500 mg PO TID -dose changed on 05/28/7627 Folic acid 1 mg by mouth daily Aspirin 81 mg daily    Interim History:  Ms. Skarzynski is here today for follow-up.  Her problem now is that she has what sounds to be a pinched nerve in her back.  She has radiculopathy down her right leg.  She has had a steroid injection which appeared not to help all that much.  She otherwise is doing okay.  I know that it is still been quite tough on her given the trauma that she and her family went through a year ago.  She is doing well with the increase Hydrea dose.  She is had no nausea or vomiting.  She is had no cough.  She is had no shortness of breath.  She is had no rashes.  There is been no bleeding.  She has had no diarrhea.  Overall, her performance status is ECOG 1.  Medications:  Allergies as of 04/27/2018      Reactions   Penicillins Itching, Rash      Medication List        Accurate as of 04/27/18 10:34 AM. Always use your most recent med list.          albuterol (2.5 MG/3ML) 0.083% nebulizer solution Commonly known as:  PROVENTIL Take 2.5 mg by nebulization every 4 (four) hours as needed for shortness of breath. Reported on 12/30/2015   albuterol 108 (90 Base) MCG/ACT inhaler Commonly known as:  PROVENTIL HFA Inhale 2 puffs into the lungs every 4 (four) hours as needed for wheezing or shortness of breath. Reported on 12/30/2015   aspirin EC 81 MG tablet Take 81 mg by mouth daily.   benzonatate 200 MG capsule Commonly known as:  TESSALON Take 1 capsule (200 mg total) by mouth 3 (three) times daily as needed for cough.   budesonide-formoterol 160-4.5 MCG/ACT inhaler Commonly known as:  SYMBICORT Inhale 2 puffs into the lungs 2 (two) times daily.   budesonide-formoterol  160-4.5 MCG/ACT inhaler Commonly known as:  SYMBICORT Inhale 2 puffs into the lungs 2 (two) times daily for 28 days.   cholecalciferol 1000 units tablet Commonly known as:  VITAMIN D Take 1,000 Units by mouth daily.   clonazePAM 0.5 MG tablet Commonly known as:  KLONOPIN TAKE 1 TABLET BY MOUTH TWICE DAILY AS NEEDED FOR ANXIETY   fluticasone 50 MCG/ACT nasal spray Commonly known as:  FLONASE Place 1 spray into the nose 2 (two) times daily.   folic acid 1 MG tablet Commonly known as:  FOLVITE Take 1 tablet (1 mg total) by mouth daily.   hydroxyurea 500 MG capsule Commonly known as:  HYDREA Take 1 capsule (500 mg total) by mouth 3 (three) times daily before meals.   Magnesium 200 MG Tabs Take 1 tablet by mouth 2 (two) times daily.   methotrexate 2.5 MG tablet Commonly known as:  RHEUMATREX Take 2.5 mg by mouth.   NON FORMULARY Take 1 capsule by mouth every morning. BLACK COHASH   omeprazole 20 MG capsule Commonly known as:  PRILOSEC TAKE 1 TABLET TWICE A DAY.   pravastatin 40 MG tablet Commonly known as:  PRAVACHOL Take 40 mg by mouth daily.   rOPINIRole 1 MG tablet Commonly known as:  REQUIP Take 1.5 mg by mouth  at bedtime.   sertraline 50 MG tablet Commonly known as:  ZOLOFT Take 50 mg by mouth daily.   traMADol 50 MG tablet Commonly known as:  ULTRAM Take by mouth every 6 (six) hours as needed.       Allergies:  Allergies  Allergen Reactions  . Penicillins Itching and Rash    Past Medical History, Surgical history, Social history, and Family History were reviewed and updated.  Review of Systems: Review of Systems  Constitutional: Negative.   Eyes: Negative.   Respiratory: Positive for shortness of breath and wheezing.   Cardiovascular: Negative.   Gastrointestinal: Negative.   Genitourinary: Negative.   Musculoskeletal: Negative.   Skin: Negative.   Neurological: Positive for tingling.  Endo/Heme/Allergies: Negative.     Psychiatric/Behavioral: Negative.      Physical Exam:  weight is 177 lb (80.3 kg). Her oral temperature is 97.9 F (36.6 C). Her blood pressure is 118/60 and her pulse is 75. Her respiration is 18 and oxygen saturation is 100%.   Wt Readings from Last 3 Encounters:  04/27/18 177 lb (80.3 kg)  03/29/18 186 lb (84.4 kg)  03/16/18 181 lb (82.1 kg)    Physical Exam  Constitutional: She is oriented to person, place, and time.  HENT:  Head: Normocephalic and atraumatic.  Mouth/Throat: Oropharynx is clear and moist.  Eyes: Pupils are equal, round, and reactive to light. EOM are normal.  Neck: Normal range of motion.  Cardiovascular: Normal rate, regular rhythm and normal heart sounds.  Pulmonary/Chest: Effort normal and breath sounds normal.  Abdominal: Soft. Bowel sounds are normal.  Musculoskeletal: Normal range of motion. She exhibits no edema, tenderness or deformity.  Lymphadenopathy:    She has no cervical adenopathy.  Neurological: She is alert and oriented to person, place, and time.  Skin: Skin is warm and dry. No rash noted. No erythema.  Psychiatric: She has a normal mood and affect. Her behavior is normal. Judgment and thought content normal.  Vitals reviewed.   Lab Results  Component Value Date   WBC 4.2 04/27/2018   HGB 12.3 04/27/2018   HCT 37.4 04/27/2018   MCV 78.1 (L) 04/27/2018   PLT 372 04/27/2018   Lab Results  Component Value Date   FERRITIN 41 03/02/2018   IRON 111 03/02/2018   TIBC 270 03/02/2018   UIBC 159 03/02/2018   IRONPCTSAT 41 03/02/2018   Lab Results  Component Value Date   RETICCTPCT 0.8 04/27/2018   RBC 4.79 04/27/2018   RBC 4.79 04/27/2018   RETICCTABS 73.1 08/06/2015   No results found for: KPAFRELGTCHN, LAMBDASER, KAPLAMBRATIO No results found for: IGGSERUM, IGA, IGMSERUM No results found for: Odetta Pink, SPEI   Chemistry      Component Value Date/Time   NA 144  03/02/2018 1220   NA 145 11/24/2017 0754   NA 142 05/25/2017 0755   K 3.8 03/02/2018 1220   K 3.7 11/24/2017 0754   K 3.9 05/25/2017 0755   CL 106 03/02/2018 1220   CL 104 11/24/2017 0754   CO2 29 03/02/2018 1220   CO2 27 11/24/2017 0754   CO2 24 05/25/2017 0755   BUN 14 03/02/2018 1220   BUN 12 11/24/2017 0754   BUN 13.3 05/25/2017 0755   CREATININE 1.10 03/02/2018 1220   CREATININE 1.0 11/24/2017 0754   CREATININE 0.8 05/25/2017 0755      Component Value Date/Time   CALCIUM 9.2 03/02/2018 1220   CALCIUM 9.1 11/24/2017 0754  CALCIUM 9.5 05/25/2017 0755   ALKPHOS 67 03/02/2018 1220   ALKPHOS 70 11/24/2017 0754   ALKPHOS 74 05/25/2017 0755   AST 18 03/02/2018 1220   AST 21 05/25/2017 0755   ALT 19 03/02/2018 1220   ALT 18 11/24/2017 0754   ALT 14 05/25/2017 0755   BILITOT 0.7 03/02/2018 1220   BILITOT 0.37 05/25/2017 0755      Impression and Plan: Ms. Blitzer is a very pleasant 72 yo African American female with essential thrombocythemia, JAK2 positive.   Given that she is responding, we will keep her on Hydrea.  I will plan to get her back in 3 more months.  If her platelet count continues to respond, then maybe we can decrease the dose of the Hydrea.   [name], MD 5/24/201910:34 AM

## 2018-05-10 MED FILL — HYDROXYUREA 500 MG CAPSULE: 500 | 30 days supply | Qty: 90 | Fill #5

## 2018-06-26 DIAGNOSIS — M0579 Rheumatoid arthritis with rheumatoid factor of multiple sites without organ or systems involvement: Secondary | ICD-10-CM | POA: Diagnosis not present

## 2018-06-26 DIAGNOSIS — Z79899 Other long term (current) drug therapy: Secondary | ICD-10-CM | POA: Diagnosis not present

## 2018-06-26 DIAGNOSIS — D638 Anemia in other chronic diseases classified elsewhere: Secondary | ICD-10-CM | POA: Diagnosis not present

## 2018-06-26 DIAGNOSIS — M7061 Trochanteric bursitis, right hip: Secondary | ICD-10-CM | POA: Diagnosis not present

## 2018-06-26 DIAGNOSIS — M79641 Pain in right hand: Secondary | ICD-10-CM | POA: Diagnosis not present

## 2018-06-26 DIAGNOSIS — D649 Anemia, unspecified: Secondary | ICD-10-CM | POA: Diagnosis not present

## 2018-06-26 DIAGNOSIS — M549 Dorsalgia, unspecified: Secondary | ICD-10-CM | POA: Diagnosis not present

## 2018-07-10 ENCOUNTER — Encounter: Payer: Self-pay | Admitting: Emergency Medicine

## 2018-07-10 ENCOUNTER — Ambulatory Visit: Payer: Medicare Other | Admitting: Emergency Medicine

## 2018-07-10 DIAGNOSIS — Z79899 Other long term (current) drug therapy: Secondary | ICD-10-CM

## 2018-07-10 DIAGNOSIS — J383 Other diseases of vocal cords: Secondary | ICD-10-CM

## 2018-07-10 DIAGNOSIS — J301 Allergic rhinitis due to pollen: Secondary | ICD-10-CM

## 2018-07-10 DIAGNOSIS — J45909 Unspecified asthma, uncomplicated: Secondary | ICD-10-CM

## 2018-07-10 DIAGNOSIS — Z79631 Long term (current) use of antimetabolite agent: Secondary | ICD-10-CM | POA: Insufficient documentation

## 2018-07-10 NOTE — Assessment & Plan Note (Signed)
I am concerned that her asthma is less well-controlled due to evolving recurrent dysphasia.  She is had esophageal dilation with Dr. Henrene Pastor in the past but not recently.  She does not have any active bronchospasm or stridor today.  Please continue your Symbicort 2 puffs twice a day as you have been taking it.  Remember to rinse and gargle after using. Keep albuterol available to use either 1 nebulizer treatment or 2 puffs up to every 4 hours as needed for shortness of breath, coughing, chest tightness. You will need a flu shot in the fall Follow with Dr Lamonte Sakai in 3 months or sooner if you have any problems.

## 2018-07-10 NOTE — Assessment & Plan Note (Signed)
We will need to follow her clinically and by intermittent imaging.  She does not have crackles on exam today.  I will follow her chest x-ray next time.

## 2018-07-10 NOTE — Patient Instructions (Addendum)
We will need to follow up with Dr Henrene Pastor regarding your swallowing Continue omeprazole twice a day as you have been taking it. Please follow with Drs Ashby Dawes and Dr Donalda Ewings regarding your blood counts and your methotrexate dosing.  Please continue your Symbicort 2 puffs twice a day as you have been taking it.  Remember to rinse and gargle after using. Keep albuterol available to use either 1 nebulizer treatment or 2 puffs up to every 4 hours as needed for shortness of breath, coughing, chest tightness. You will need a flu shot in the fall Follow with Dr Lamonte Sakai in 3 months or sooner if you have any problems.

## 2018-07-10 NOTE — Assessment & Plan Note (Signed)
Well-controlled at this time.  Her dysphasia appears to be bothering her obstructive disease more than her upper airway.  Continue GERD control, rhinitis control.

## 2018-07-10 NOTE — Progress Notes (Signed)
  Subjective:    Patient ID: [name], female    DOB: [birth year]   MRN: [id number] HPI ROV 03/16/18 -- Pleasant 72 yo woman with a hx mild to moderate persistent asthma and vocal cord dysfunction. Hx of GERD and allergic rhinitis which effect both problems.  She was treated for an acute exacerbation in January 2019 with azithromycin and prednisone.  She was having more dyspnea last week, some cough, wheeze. She believes that the pollen impacted her. Currently managed on Symbicort 2 puffs twice a day, fluticasone nasal spray bid, omeprazole 20 mg twice a day. Averages albuterol about once a day. Good compliance w Symbicort. She is under eval for possible RA by rheumatology. Hasn;t started any therapy yet because she has an equivocal quantGOLD.   ROV 07/10/18 --72 year old woman with suspected rheumatoid arthritis.  Therapy delayed due to an equivocal QuantiFERON gold, but now on MTX 10mg  / wk since April.  Mild to moderate persistent asthma and severe frequently flaring vocal cord dysfunction with associated cough and upper airway obstruction.  She has GERD and allergic rhinitis which are significant contributors.  I last saw her in April. She reports some continued R LE pain, maybe better with the MTX. She feels a bit weaker. She has had more dyspnea, has been using her albuterol more for the last month. Her throat feels more irritated, may have some dysphagia - has been dilated in past by Dr Henrene Pastor. Her omeprazole is working - no overt GERD sx. She is using Symbicort reliably. Uses albuterol at least bid.   ROS:  As per HPI.    Objective:   Physical Exam Vitals:   07/10/18 0909  BP: 126/74  Pulse: 92  SpO2: 99%  Weight: 173 lb (78.5 kg)  Height: 5\' 2"  (1.575 m)   Gen: Pleasant, overwt, in no distress,  normal affect, rare cough  ENT: No lesions,  mouth clear,  oropharynx clear, no postnasal drip, strong voice today  Neck: supple, no significant stridor  Lungs : no wheeze  today  Cardiovascular: RRR, heart sounds normal, no murmur or gallops, no peripheral edema  Musculoskeletal: No deformities, no cyanosis or clubbing  Neuro: alert, non focal  Skin: Warm, no rash    Assessment & Plan:   Intrinsic asthma I am concerned that her asthma is less well-controlled due to evolving recurrent dysphasia.  She is had esophageal dilation with Dr. Henrene Pastor in the past but not recently.  She does not have any active bronchospasm or stridor today.  Please continue your Symbicort 2 puffs twice a day as you have been taking it.  Remember to rinse and gargle after using. Keep albuterol available to use either 1 nebulizer treatment or 2 puffs up to every 4 hours as needed for shortness of breath, coughing, chest tightness. You will need a flu shot in the fall Follow with Dr Lamonte Sakai in 3 months or sooner if you have any problems.  Allergic rhinitis Continue flonase.   Vocal cord dysfunction Well-controlled at this time.  Her dysphasia appears to be bothering her obstructive disease more than her upper airway.  Continue GERD control, rhinitis control.  On methotrexate therapy We will need to follow her clinically and by intermittent imaging.  She does not have crackles on exam today.  I will follow her chest x-ray next time.  Baltazar Apo, MD, PhD 07/10/2018, 9:40 AM [facility] [phone] or if no answer [phone]

## 2018-07-10 NOTE — Assessment & Plan Note (Signed)
Continue flonase 

## 2018-07-11 DIAGNOSIS — R5383 Other fatigue: Secondary | ICD-10-CM | POA: Diagnosis not present

## 2018-07-11 DIAGNOSIS — D649 Anemia, unspecified: Secondary | ICD-10-CM | POA: Diagnosis not present

## 2018-07-11 DIAGNOSIS — R269 Unspecified abnormalities of gait and mobility: Secondary | ICD-10-CM | POA: Diagnosis not present

## 2018-07-11 DIAGNOSIS — J45909 Unspecified asthma, uncomplicated: Secondary | ICD-10-CM | POA: Diagnosis not present

## 2018-07-11 DIAGNOSIS — M150 Primary generalized (osteo)arthritis: Secondary | ICD-10-CM | POA: Diagnosis not present

## 2018-07-27 ENCOUNTER — Inpatient Hospital Stay: Payer: Medicare Other | Admitting: Hematology & Oncology

## 2018-07-27 ENCOUNTER — Inpatient Hospital Stay: Payer: Medicare Other

## 2018-09-10 DIAGNOSIS — R5383 Other fatigue: Secondary | ICD-10-CM | POA: Diagnosis not present

## 2018-09-10 DIAGNOSIS — E782 Mixed hyperlipidemia: Secondary | ICD-10-CM | POA: Diagnosis not present

## 2018-09-10 DIAGNOSIS — K219 Gastro-esophageal reflux disease without esophagitis: Secondary | ICD-10-CM | POA: Diagnosis not present

## 2018-09-10 DIAGNOSIS — D473 Essential (hemorrhagic) thrombocythemia: Secondary | ICD-10-CM | POA: Diagnosis not present

## 2018-09-10 DIAGNOSIS — D649 Anemia, unspecified: Secondary | ICD-10-CM | POA: Diagnosis not present

## 2018-09-17 DIAGNOSIS — Z23 Encounter for immunization: Secondary | ICD-10-CM | POA: Diagnosis not present

## 2018-09-17 DIAGNOSIS — E782 Mixed hyperlipidemia: Secondary | ICD-10-CM | POA: Diagnosis not present

## 2018-09-17 DIAGNOSIS — D72819 Decreased white blood cell count, unspecified: Secondary | ICD-10-CM | POA: Diagnosis not present

## 2018-09-17 DIAGNOSIS — D649 Anemia, unspecified: Secondary | ICD-10-CM | POA: Diagnosis not present

## 2018-09-17 DIAGNOSIS — D473 Essential (hemorrhagic) thrombocythemia: Secondary | ICD-10-CM | POA: Diagnosis not present

## 2018-09-20 ENCOUNTER — Ambulatory Visit (INDEPENDENT_AMBULATORY_CARE_PROVIDER_SITE_OTHER): Payer: Medicare Other | Admitting: Adult Health

## 2018-09-20 ENCOUNTER — Ambulatory Visit (INDEPENDENT_AMBULATORY_CARE_PROVIDER_SITE_OTHER)
Admission: RE | Admit: 2018-09-20 | Discharge: 2018-09-20 | Disposition: A | Discharge status: Home / Self Care | Payer: Medicare Other | Source: Ambulatory Visit | Attending: Adult Health | Admitting: Adult Health

## 2018-09-20 ENCOUNTER — Ambulatory Visit: Payer: Medicare Other | Admitting: Internal Medicine

## 2018-09-20 ENCOUNTER — Encounter: Payer: Self-pay | Admitting: Adult Health

## 2018-09-20 ENCOUNTER — Encounter (INDEPENDENT_AMBULATORY_CARE_PROVIDER_SITE_OTHER): Payer: Self-pay

## 2018-09-20 ENCOUNTER — Encounter: Payer: Self-pay | Admitting: Internal Medicine

## 2018-09-20 VITALS — BP 110/60 | HR 92 | Ht 62.0 in | Wt 169.2 lb

## 2018-09-20 VITALS — BP 122/66 | HR 97 | Temp 97.4°F | Ht 61.75 in | Wt 169.8 lb

## 2018-09-20 DIAGNOSIS — R062 Wheezing: Secondary | ICD-10-CM

## 2018-09-20 DIAGNOSIS — R05 Cough: Secondary | ICD-10-CM | POA: Diagnosis not present

## 2018-09-20 DIAGNOSIS — K219 Gastro-esophageal reflux disease without esophagitis: Secondary | ICD-10-CM | POA: Diagnosis not present

## 2018-09-20 DIAGNOSIS — J45909 Unspecified asthma, uncomplicated: Secondary | ICD-10-CM

## 2018-09-20 DIAGNOSIS — K222 Esophageal obstruction: Secondary | ICD-10-CM

## 2018-09-20 DIAGNOSIS — R1310 Dysphagia, unspecified: Secondary | ICD-10-CM | POA: Diagnosis not present

## 2018-09-20 DIAGNOSIS — J301 Allergic rhinitis due to pollen: Secondary | ICD-10-CM

## 2018-09-20 MED ORDER — BENZONATATE 200 MG PO CAPS: 200.0000 mg | ORAL_CAPSULE | Freq: Three times a day (TID) | ORAL | 1 refills | Status: DC | PRN

## 2018-09-20 MED ORDER — AZITHROMYCIN 250 MG PO TABS: ORAL_TABLET | ORAL | 0 refills | Status: AC

## 2018-09-20 MED ORDER — PREDNISONE 10 MG PO TABS: ORAL_TABLET | ORAL | 0 refills | Status: DC

## 2018-09-20 NOTE — Assessment & Plan Note (Signed)
Acute exacerbation with bronchitis Check cxr today   Plan  Patient Instructions  Z-Pak take as directed Prednisone taper take as directed  Mucinex DM twice daily as needed for cough and congestion Tessalon as needed 3 times daily for cough Symbicort 2 puffs twice daily rinse after use. Chest x-ray today Albuterol as needed for wheezing and shortness of breath Follow up with Dr. Lamonte Sakai or Parrett NP in 2 weeks and As needed  Please contact office for sooner follow up if symptoms do not improve or worsen or seek emergency care

## 2018-09-20 NOTE — Patient Instructions (Signed)
Z-Pak take as directed Prednisone taper take as directed  Mucinex DM twice daily as needed for cough and congestion Tessalon as needed 3 times daily for cough Symbicort 2 puffs twice daily rinse after use. Chest x-ray today Albuterol as needed for wheezing and shortness of breath Follow up with Dr. Lamonte Sakai or Parrett NP in 2 weeks and As needed  Please contact office for sooner follow up if symptoms do not improve or worsen or seek emergency care

## 2018-09-20 NOTE — Assessment & Plan Note (Signed)
Control for triggers  Cont on current regimen  

## 2018-09-20 NOTE — Assessment & Plan Note (Addendum)
Cont current regimen , if not improved by next week with asthma, may have to postpone EGD .  Follow up with GI as planned .

## 2018-09-20 NOTE — Progress Notes (Signed)
@Patient  ID: [name], female    DOB: 09-14-1946, 72 y.o.   MRN: [id number]  Chief Complaint  Patient presents with  . Acute Visit    Asthma     Referring provider: Merrilee Seashore, MD  HPI: 72 year old female former smoker followed for asthma and vocal cord dysfunction complicated by gastroesophageal reflux.  And allergic rhinitis  history of esophageal stricture  TEST  PFT March 2018 showed FEV1 86%, ratio 61, FVC 110% significant positive bronchodilator response DLCO 90%   09/20/2018 Follow up : Asthma /AR  Patient presents for an acute office visit.  Patient complains of 3 days of increased cough congestion wheezing and increased shortness of breath.  Patient remains on Symbicort twice daily.  Says she depends on samples as it is not affordable .  Got flu shot and next day got sick.  Says that she has run out of her Tessalon and would like a refill.  Appetite is fair.  She is followed by rheumatology and is on methotrexate for suspected rheumatoid arthritis  He was seen by GI today for ongoing dysphagia.  She has a plan for EGD next week.   Allergies  Allergen Reactions  . Penicillins Itching and Rash    Immunization History  Administered Date(s) Administered  . Influenza Split 11/10/2011, 10/10/2012, 09/17/2018  . Influenza Whole 10/19/2006, 09/03/2008, 10/05/2009, 09/07/2010  . Influenza, High Dose Seasonal PF 09/05/2017  . Influenza,inj,Quad PF,6+ Mos 09/11/2013, 08/08/2014, 09/14/2015, 09/05/2016  . Pneumococcal Polysaccharide-23 03/01/2006  . Td 03/20/2009  . Zoster 10/19/2006    Past Medical History:  Diagnosis Date  . Allergic rhinitis   . Anemia   . Asthma   . Chronic bronchitis ([place])   . Colonic polyp   . DDD (degenerative disc disease), lumbar 07/20/2011  . DJD (degenerative joint disease) of knee    bilateral  . DJD (degenerative joint disease), lumbar 07/20/2011  . Esophageal stricture   . GERD (gastroesophageal reflux disease)   .  Hiatal hernia   . Hyperlipidemia   . Osteoporosis   . Stroke [facility])    "didn't know I'd had one before I was told; didn't do any damage" (11/24/2016)  . Vocal cord dysfunction     Tobacco History: Social History   Tobacco Use  Smoking Status Former Smoker  . Packs/day: 0.10  . Years: 30.00  . Pack years: 3.00  . Types: Cigarettes  . Start date: 01/31/1975  . Last attempt to quit: 08/09/1995  . Years since quitting: 23.1  Smokeless Tobacco Never Used   Counseling given: Not Answered   Outpatient Medications Prior to Visit  Medication Sig Dispense Refill  . albuterol (PROVENTIL HFA) 108 (90 Base) MCG/ACT inhaler Inhale 2 puffs into the lungs every 4 (four) hours as needed for wheezing or shortness of breath. Reported on 12/30/2015 1 Inhaler 6  . albuterol (PROVENTIL) (2.5 MG/3ML) 0.083% nebulizer solution Take 2.5 mg by nebulization every 4 (four) hours as needed for shortness of breath. Reported on 12/30/2015    . aspirin EC 81 MG tablet Take 81 mg by mouth daily.    . benzonatate (TESSALON) 200 MG capsule Take 1 capsule (200 mg total) by mouth 3 (three) times daily as needed for cough. 45 capsule 3  . budesonide-formoterol (SYMBICORT) 160-4.5 MCG/ACT inhaler Inhale 2 puffs into the lungs 2 (two) times daily for 28 days. 2 Inhaler 0  . cholecalciferol (VITAMIN D) 1000 UNITS tablet Take 1,000 Units by mouth daily.    . clonazePAM (KLONOPIN)  0.5 MG tablet TAKE 1 TABLET BY MOUTH TWICE DAILY AS NEEDED FOR ANXIETY  0  . fluticasone (FLONASE) 50 MCG/ACT nasal spray Place 1 spray into the nose 2 (two) times daily.     . folic acid (FOLVITE) 1 MG tablet Take 1 tablet (1 mg total) by mouth daily. 90 tablet 3  . hydroxyurea (HYDREA) 500 MG capsule Take 1 capsule (500 mg total) by mouth 3 (three) times daily before meals. 90 capsule 6  . Magnesium 200 MG TABS Take 1 tablet by mouth 2 (two) times daily.     . methotrexate (RHEUMATREX) 2.5 MG tablet Take 2.5 mg by mouth.  0  . NON FORMULARY Take 1  capsule by mouth every morning. BLACK COHASH    . omeprazole (PRILOSEC) 20 MG capsule TAKE 1 TABLET TWICE A DAY. 180 capsule 1  . pravastatin (PRAVACHOL) 40 MG tablet Take 40 mg by mouth daily.    Marland Kitchen rOPINIRole (REQUIP) 1 MG tablet Take 1.5 mg by mouth at bedtime.    . sertraline (ZOLOFT) 50 MG tablet Take 50 mg by mouth daily.     No facility-administered medications prior to visit.      Review of Systems  Constitutional:   No  weight loss, night sweats,  Fevers, chills, + fatigue, or  lassitude.  HEENT:   No headaches,  Difficulty swallowing,  Tooth/dental problems, or  Sore throat,                No sneezing, itching, ear ache,  +nasal congestion, post nasal drip,   CV:  No chest pain,  Orthopnea, PND, swelling in lower extremities, anasarca, dizziness, palpitations, syncope.   GI  +heartburn, indigestion,   Resp:  No chest wall deformity  Skin: no rash or lesions.  GU: no dysuria, change in color of urine, no urgency or frequency.  No flank pain, no hematuria   MS:  + joint pain     Physical Exam  BP 122/66 (BP Location: Left Arm, Cuff Size: Normal)   Pulse 97   Temp (!) 97.4 F (36.3 C) (Oral)   Ht 5' 1.75" (1.568 m)   Wt 169 lb 12.8 oz (77 kg)   SpO2 100%   BMI 31.31 kg/m   GEN: A/Ox3; pleasant , NAD, elderly   HEENT:  [place]/AT,  EACs-clear, TMs-wnl, NOSE-clear, THROAT-clear, no lesions, no postnasal drip or exudate noted.   NECK:  Supple w/ fair ROM; no JVD; normal carotid impulses w/o bruits; no thyromegaly or nodules palpated; no lymphadenopathy.    RESP few trace expiratory wheezes, speaks in full sentences,  no accessory muscle use, no dullness to percussion  CARD:  RRR, no m/r/g, no peripheral edema, pulses intact, no cyanosis or clubbing.  GI:   Soft & nt; nml bowel sounds; no organomegaly or masses detected.   Musco: Warm bil, no deformities or joint swelling noted.   Neuro: alert, no focal deficits noted.    Skin: Warm, no lesions or  rashes    Lab Results:   BMET  BNP  ProBNP  Imaging: No results found.   Assessment & Plan:   Intrinsic asthma Acute exacerbation with bronchitis Check cxr today   Plan  Patient Instructions  Z-Pak take as directed Prednisone taper take as directed  Mucinex DM twice daily as needed for cough and congestion Tessalon as needed 3 times daily for cough Symbicort 2 puffs twice daily rinse after use. Chest x-ray today Albuterol as needed for wheezing and shortness of  breath Follow up with Dr. Lamonte Sakai or Parrett NP in 2 weeks and As needed  Please contact office for sooner follow up if symptoms do not improve or worsen or seek emergency care       Allergic rhinitis Control for triggers .  Cont on current regimen .   Esophageal reflux Cont current regimen , if not improved by next week with asthma, may have to postpone EGD .  Follow up with GI as planned .       Rexene Edison, NP 09/20/2018

## 2018-09-20 NOTE — Patient Instructions (Signed)

## 2018-09-20 NOTE — Progress Notes (Signed)
HISTORY OF PRESENT ILLNESS:  [name] is a 72 y.o. female with multiple medical problems as listed below.  She presents today with a 37-month history of worsening dysphagia to both solids and liquids.  She does have a history of GERD for which she takes omeprazole 20 mg twice daily.  No pyrosis on medication.  Patient is known to have a distal esophageal stricture requiring esophageal dilation up to 18 mm.  She was last seen in this facility January 2017 when she underwent both colonoscopy and upper endoscopy to evaluate iron deficiency anemia.  Colonoscopy revealed diminutive adenomatous colon polyps which were removed.  No other abnormalities.  Follow-up in 5 years recommended.  Upper endoscopy revealed esophageal stricture and hiatal hernia without erosions.  No dysphasia at that time thus no dilation.  Review of outside blood work from May 2019 reveals hemoglobin 12.3 with MCV 78.1.  Normal white blood cell count and platelets.  Normal comprehensive metabolic panel.  REVIEW OF SYSTEMS:  All non-GI ROS negative unless otherwise stated in the HPI except for seasonal allergies, asthma  Past Medical History:  Diagnosis Date  . Allergic rhinitis   . Anemia   . Asthma   . Chronic bronchitis ([place])   . Colonic polyp   . DDD (degenerative disc disease), lumbar 07/20/2011  . DJD (degenerative joint disease) of knee    bilateral  . DJD (degenerative joint disease), lumbar 07/20/2011  . Esophageal stricture   . GERD (gastroesophageal reflux disease)   . Hiatal hernia   . Hyperlipidemia   . Osteoporosis   . Stroke [facility])    "didn't know I'd had one before I was told; didn't do any damage" (11/24/2016)  . Vocal cord dysfunction     Past Surgical History:  Procedure Laterality Date  . CARPAL TUNNEL RELEASE Bilateral   . COLONOSCOPY W/ BIOPSIES AND POLYPECTOMY    . ESOPHAGOGASTRODUODENOSCOPY (EGD) WITH ESOPHAGEAL DILATION    . FOOT SURGERY Bilateral    "might have been bunions or  hammertoes"  . KNEE ARTHROSCOPY Left 11/06/2013  . TOTAL ABDOMINAL HYSTERECTOMY    . VIDEO BRONCHOSCOPY Bilateral 12/12/2014   Procedure: VIDEO BRONCHOSCOPY WITHOUT FLUORO;  Surgeon: Chesley Mires, MD;  Location: [facility] ENDOSCOPY;  Service: Cardiopulmonary;  Laterality: Bilateral;    Social History [name]  reports that she quit smoking about 23 years ago. Her smoking use included cigarettes. She started smoking about 43 years ago. She has a 3.00 pack-year smoking history. She has never used smokeless tobacco. She reports that she does not drink alcohol or use drugs.  family history includes Asthma in her brother and unknown relative; Cancer in her sister; Colon cancer (age of onset: 98) in her father; Heart attack in her mother.  Allergies  Allergen Reactions  . Penicillins Itching and Rash       PHYSICAL EXAMINATION: Vital signs: BP 110/60   Pulse 92   Ht 5\' 2"  (1.575 m)   Wt 169 lb 3.2 oz (76.7 kg)   BMI 30.95 kg/m   Constitutional: generally well-appearing, no acute distress Psychiatric: alert and oriented x3, cooperative Eyes: extraocular movements intact, anicteric, conjunctiva pink Mouth: oral pharynx moist, no lesions Neck: supple no lymphadenopathy Cardiovascular: heart regular rate and rhythm, no murmur Lungs: End expiratory bilaterally with good air movement Abdomen: soft, nontender, nondistended, no obvious ascites, no peritoneal signs, normal bowel sounds, no organomegaly Rectal: Omitted Extremities: no, cyanosis, or lower extremity edema bilaterally Skin: no lesions on visible extremities Neuro: No focal  deficits.  Cranial nerves intact  ASSESSMENT:  1.  GERD 2.  History of esophageal stricture requiring dilation 3.  98-month history of solid food and liquid dysphasia.  Rule out recurrent peptic stricture 4.  Multiple medical problems 5.  Wheezing today 6.  Obesity  PLAN:  1.  Reflux precautions with attention to weight loss 2.  Continue PPI 3.   Schedule upper endoscopy with esophageal dilation.The nature of the procedure, as well as the risks, benefits, and alternatives were carefully and thoroughly reviewed with the patient. Ample time for discussion and questions allowed. The patient understood, was satisfied, and agreed to proceed. 4.  Instructed to contact PCP today regarding her wheezing.  She may need steroids as she has had in the past 5.  Surveillance colonoscopy due around January 2022

## 2018-09-21 MED ORDER — BUDESONIDE-FORMOTEROL FUMARATE 160-4.5 MCG/ACT IN AERO: 2.0000 | INHALATION_SPRAY | Freq: Two times a day (BID) | RESPIRATORY_TRACT | 0 refills | Status: DC

## 2018-09-21 NOTE — Addendum Note (Signed)
Addended by: Parke Poisson E on: 09/21/2018 09:29 AM   Modules accepted: Orders

## 2018-09-24 ENCOUNTER — Other Ambulatory Visit: Payer: Medicare Other

## 2018-09-24 ENCOUNTER — Ambulatory Visit: Payer: Medicare Other | Admitting: Family

## 2018-09-25 ENCOUNTER — Encounter: Payer: Self-pay | Admitting: Internal Medicine

## 2018-09-25 ENCOUNTER — Ambulatory Visit (AMBULATORY_SURGERY_CENTER): Payer: Medicare Other | Admitting: Internal Medicine

## 2018-09-25 ENCOUNTER — Telehealth: Payer: Self-pay | Admitting: Adult Health

## 2018-09-25 VITALS — BP 110/64 | HR 71 | Temp 97.8°F | Resp 14 | Ht 62.0 in | Wt 169.0 lb

## 2018-09-25 DIAGNOSIS — K222 Esophageal obstruction: Secondary | ICD-10-CM

## 2018-09-25 DIAGNOSIS — R1310 Dysphagia, unspecified: Secondary | ICD-10-CM

## 2018-09-25 DIAGNOSIS — K219 Gastro-esophageal reflux disease without esophagitis: Secondary | ICD-10-CM

## 2018-09-25 MED ORDER — SODIUM CHLORIDE 0.9 % IV SOLN: 500.0000 mL | Freq: Once | INTRAVENOUS | Status: DC

## 2018-09-25 NOTE — Patient Instructions (Signed)
YOU HAD AN ENDOSCOPIC PROCEDURE TODAY AT [facility]:   Refer to the procedure report that was given to you for any specific questions about what was found during the examination.  If the procedure report does not answer your questions, please call your gastroenterologist to clarify.  If you requested that your care partner not be given the details of your procedure findings, then the procedure report has been included in a sealed envelope for you to review at your convenience later.  YOU SHOULD EXPECT: Some feelings of bloating in the abdomen. Passage of more gas than usual.  Walking can help get rid of the air that was put into your GI tract during the procedure and reduce the bloating. If you had a lower endoscopy (such as a colonoscopy or flexible sigmoidoscopy) you may notice spotting of blood in your stool or on the toilet paper. If you underwent a bowel prep for your procedure, you may not have a normal bowel movement for a few days.  Please Note:  You might notice some irritation and congestion in your nose or some drainage.  This is from the oxygen used during your procedure.  There is no need for concern and it should clear up in a day or so.  SYMPTOMS TO REPORT IMMEDIATELY:   Following upper endoscopy (EGD)  Vomiting of blood or coffee ground material  New chest pain or pain under the shoulder blades  Painful or persistently difficult swallowing  New shortness of breath  Fever of 100F or higher  Black, tarry-looking stools  For urgent or emergent issues, a gastroenterologist can be reached at any hour by calling [phone].   DIET:  Follow a post-dilation diet (see handout given to you by your recovery nurse), Nothing by mouth until 1000 am, Start clear liquids at 1000 am for 1 hour, then proceed to a soft diet for the rest of today beginning at 1100 am, but then you may proceed to your regular diet tomorrow as tolerated.  Drink plenty of fluids but you should  avoid alcoholic beverages for 24 hours.  MEDICATIONS: Continue present medications.  Please see handouts given to you by your recovery nurse.  ACTIVITY:  You should plan to take it easy for the rest of today and you should NOT DRIVE or use heavy machinery until tomorrow (because of the sedation medicines used during the test).    FOLLOW UP: Our staff will call the number listed on your records the next business day following your procedure to check on you and address any questions or concerns that you may have regarding the information given to you following your procedure. If we do not reach you, we will leave a message.  However, if you are feeling well and you are not experiencing any problems, there is no need to return our call.  We will assume that you have returned to your regular daily activities without incident.  If any biopsies were taken you will be contacted by phone or by letter within the next 1-3 weeks.  Please call us at [phone] if you have not heard about the biopsies in 3 weeks.   Thank you for allowing [place] to provide for your healthcare needs today.  SIGNATURES/CONFIDENTIALITY: You and/or your care partner have signed paperwork which will be entered into your electronic medical record.  These signatures attest to the fact that that the information above on your After Visit Summary has been reviewed and is understood.  Full  responsibility of the confidentiality of this discharge information lies with you and/or your care-partner.

## 2018-09-25 NOTE — Telephone Encounter (Signed)
Notes recorded by Melvenia Needles, NP on 09/24/2018 at 4:53 PM EDT No sign of PNA  Cont w/ ov recs Please contact office for sooner follow up if symptoms do not improve or worsen or seek emergency care   lmtcb x1 for pt

## 2018-09-25 NOTE — Progress Notes (Signed)
Called to room to assist during endoscopic procedure.  Patient ID and intended procedure confirmed with present staff. Received instructions for my participation in the procedure from the performing physician.  

## 2018-09-25 NOTE — Op Note (Signed)
[facility] Patient Name: [name] Procedure Date: 09/25/2018 8:23 AM MRN: [id number] Endoscopist: Docia Chuck. Henrene Pastor , MD Age: 73 Referring MD:  Date of Birth: 1946-03-01 Gender: Female Account #: [id number] Procedure:                Upper GI endoscopy with balloon dilation of the                            esophagus?"18, 19, 20 mm Indications:              Dysphagia Medicines:                Monitored Anesthesia Care Procedure:                Pre-Anesthesia Assessment:                           - Prior to the procedure, a History and Physical                            was performed, and patient medications and                            allergies were reviewed. The patient's tolerance of                            previous anesthesia was also reviewed. The risks                            and benefits of the procedure and the sedation                            options and risks were discussed with the patient.                            All questions were answered, and informed consent                            was obtained. Prior Anticoagulants: The patient has                            taken no previous anticoagulant or antiplatelet                            agents. ASA Grade Assessment: II - A patient with                            mild systemic disease. After reviewing the risks                            and benefits, the patient was deemed in                            satisfactory condition to undergo the procedure.  After obtaining informed consent, the endoscope was                            passed under direct vision. Throughout the                            procedure, the patient's blood pressure, pulse, and                            oxygen saturations were monitored continuously. The                            Model GIF-HQ190 [id number]) scope was introduced                            through the mouth, and advanced to the  second part                            of duodenum. The upper GI endoscopy was                            accomplished without difficulty. The patient                            tolerated the procedure well. Scope In: Scope Out: Findings:                 One benign-appearing, intrinsic moderate stenosis                            was found 34 cm from the incisors. This stenosis                            measured 1.5 cm (inner diameter). A TTS dilator was                            passed through the scope. Dilation with an 18-19-20                            mm balloon dilator was performed to 20 mm.                           The exam of the esophagus was otherwise normal.                           The stomach was normal save a 3 to 4 cm hiatal                            hernia.                           The examined duodenum was normal.                           The cardia  and gastric fundus were normal on                            retroflexion. Complications:            No immediate complications. Estimated Blood Loss:     Estimated blood loss: none. Impression:               - Benign-appearing esophageal stenosis. Dilated.                           - Normal stomach with hiatal hernia.                           - Normal examined duodenum.                           - No specimens collected. Recommendation:           - Patient has a contact number available for                            emergencies. The signs and symptoms of potential                            delayed complications were discussed with the                            patient. Return to normal activities tomorrow.                            Written discharge instructions were provided to the                            patient.                           - Post dilation diet.                           - Continue present medications.                           - Return to the care of your primary provider. GI                             follow-up as needed Docia Chuck. Henrene Pastor, MD 09/25/2018 8:46:58 AM This report has been signed electronically.

## 2018-09-26 ENCOUNTER — Telehealth: Payer: Self-pay | Admitting: *Deleted

## 2018-09-26 NOTE — Telephone Encounter (Signed)
Attempted to call pt but no answer. left message for pt to return call. 

## 2018-09-26 NOTE — Telephone Encounter (Signed)
  Follow up Call-  Call back number 09/25/2018  Post procedure Call Back phone  # [phone]  Permission to leave phone message Yes  Some recent data might be hidden     Patient questions:  Do you have a fever, pain , or abdominal swelling? No. Pain Score  0 *  Have you tolerated food without any problems? Yes.    Have you been able to return to your normal activities? yes   Do you have any questions about your discharge instructions: Diet   No. Medications  No. Follow up visit  No.  Do you have questions or concerns about your Care? No.  Actions: * If pain score is 4 or above: No action needed, pain <4.

## 2018-09-27 NOTE — Telephone Encounter (Signed)
Attempted to call pt but unable to reach her. Left message for pt to return call x3

## 2018-09-27 NOTE — Telephone Encounter (Signed)
Patient returning Emily's phone call.  Call back number for pt is [phone].

## 2018-09-27 NOTE — Telephone Encounter (Signed)
Spoke with pt and notified of results per Tammy Parrett, NP. Pt verbalized understanding and denied any questions. 

## 2018-10-04 ENCOUNTER — Ambulatory Visit: Payer: Medicare Other | Admitting: Adult Health

## 2018-10-26 ENCOUNTER — Inpatient Hospital Stay (HOSPITAL_BASED_OUTPATIENT_CLINIC_OR_DEPARTMENT_OTHER): Payer: Medicare Other | Admitting: Hematology & Oncology

## 2018-10-26 ENCOUNTER — Inpatient Hospital Stay: Payer: Medicare Other | Attending: Hematology & Oncology

## 2018-10-26 ENCOUNTER — Other Ambulatory Visit: Payer: Self-pay

## 2018-10-26 ENCOUNTER — Encounter: Payer: Self-pay | Admitting: Hematology & Oncology

## 2018-10-26 VITALS — BP 123/75 | HR 80 | Temp 98.2°F | Resp 18 | Wt 170.8 lb

## 2018-10-26 DIAGNOSIS — D473 Essential (hemorrhagic) thrombocythemia: Secondary | ICD-10-CM | POA: Diagnosis not present

## 2018-10-26 DIAGNOSIS — Z7982 Long term (current) use of aspirin: Secondary | ICD-10-CM | POA: Insufficient documentation

## 2018-10-26 DIAGNOSIS — Z79899 Other long term (current) drug therapy: Secondary | ICD-10-CM | POA: Insufficient documentation

## 2018-10-26 DIAGNOSIS — D573 Sickle-cell trait: Secondary | ICD-10-CM | POA: Diagnosis not present

## 2018-10-26 DIAGNOSIS — R899 Unspecified abnormal finding in specimens from other organs, systems and tissues: Secondary | ICD-10-CM

## 2018-10-26 LAB — CBC WITH DIFFERENTIAL (CANCER CENTER ONLY)
Abs Immature Granulocytes: 0.02 10*3/uL (ref 0.00–0.07)
BASOS ABS: 0 10*3/uL (ref 0.0–0.1)
Basophils Relative: 1 %
Eosinophils Absolute: 0.1 10*3/uL (ref 0.0–0.5)
Eosinophils Relative: 1 %
HEMATOCRIT: 33.3 % — AB (ref 36.0–46.0)
HEMOGLOBIN: 10.2 g/dL — AB (ref 12.0–15.0)
IMMATURE GRANULOCYTES: 1 %
LYMPHS ABS: 1.1 10*3/uL (ref 0.7–4.0)
LYMPHS PCT: 28 %
MCH: 27.8 pg (ref 26.0–34.0)
MCHC: 30.6 g/dL (ref 30.0–36.0)
MCV: 90.7 fL (ref 80.0–100.0)
Monocytes Absolute: 0.3 10*3/uL (ref 0.1–1.0)
Monocytes Relative: 7 %
NEUTROS ABS: 2.5 10*3/uL (ref 1.7–7.7)
NEUTROS PCT: 62 %
NRBC: 0 % (ref 0.0–0.2)
Platelet Count: 304 10*3/uL (ref 150–400)
RBC: 3.67 MIL/uL — ABNORMAL LOW (ref 3.87–5.11)
RDW: 17.7 % — AB (ref 11.5–15.5)
WBC Count: 4 10*3/uL (ref 4.0–10.5)

## 2018-10-26 LAB — CMP (CANCER CENTER ONLY)
ALBUMIN: 3.5 g/dL (ref 3.5–5.0)
ALK PHOS: 67 U/L (ref 38–126)
ALT: 16 U/L (ref 0–44)
AST: 24 U/L (ref 15–41)
Anion gap: 10 (ref 5–15)
BUN: 10 mg/dL (ref 8–23)
CO2: 25 mmol/L (ref 22–32)
Calcium: 9.2 mg/dL (ref 8.9–10.3)
Chloride: 107 mmol/L (ref 98–111)
Creatinine: 0.93 mg/dL (ref 0.44–1.00)
GFR, Est AFR Am: >60 mL/min (ref >60)
GFR, Estimated: 60 mL/min — ABNORMAL LOW (ref >60)
GLUCOSE: 73 mg/dL (ref 70–99)
POTASSIUM: 3.9 mmol/L (ref 3.5–5.1)
Sodium: 142 mmol/L (ref 135–145)
TOTAL PROTEIN: 6.5 g/dL (ref 6.5–8.1)
Total Bilirubin: 0.7 mg/dL (ref 0.3–1.2)

## 2018-10-26 LAB — LACTATE DEHYDROGENASE: LDH: 285 U/L — ABNORMAL HIGH (ref 98–192)

## 2018-10-26 MED ORDER — FUSION PLUS PO CAPS: 1.0000 | ORAL_CAPSULE | Freq: Every day | ORAL | 6 refills | Status: DC

## 2018-10-26 NOTE — Progress Notes (Signed)
Hematology and Oncology Follow Up Visit  [name] [id number] 1946/08/06 72 y.o. 10/26/2018   Principle Diagnosis:  Essential thrombocythemia-JAK2 POSITIVE Sickle cell trait  Current Therapy:   Hydrea 500 mg PO TID -dose changed on 40/98/1191 Folic acid 1 mg by mouth daily Aspirin 81 mg daily    Interim History:  [name] is here today for follow-up.  Overall, I think she is doing okay.  She said that she has some pain down her left arm recently.  It became numb.  She did not see anybody about this.  She says that is getting better.  She apparently was told by her family doctor that there is something wrong with her blood.  He wanted her to come back to see [place].  I am not sure what is "wrong" with her blood.  She has a normal white cell count.  Platelet count continues to improve.  She is a little bit more anemic.  She is on Hydrea.  She is taking Hydrea 3 times a day.  Last time we checked her iron studies back in May showed a ferritin of 90 with an iron saturation of 47%.  Her family will get together for a big family Thanksgiving.  They apparently around the house for this.  It is still tough getting through the tragic death of her granddaughter.  Her granddaughter was murdered right in front of [name].   Overall, her performance status is ECOG 1.  Medications:  Allergies as of 10/26/2018      Reactions   Penicillins Itching, Rash      Medication List        Accurate as of 10/26/18 10:18 AM. Always use your most recent med list.          albuterol (2.5 MG/3ML) 0.083% nebulizer solution Commonly known as:  PROVENTIL Take 2.5 mg by nebulization every 4 (four) hours as needed for shortness of breath. Reported on 12/30/2015   albuterol 108 (90 Base) MCG/ACT inhaler Commonly known as:  PROVENTIL HFA;VENTOLIN HFA Inhale 2 puffs into the lungs every 4 (four) hours as needed for wheezing or shortness of breath. Reported on 12/30/2015   aspirin EC 81 MG  tablet Take 81 mg by mouth daily.   benzonatate 200 MG capsule Commonly known as:  TESSALON Take 1 capsule (200 mg total) by mouth 3 (three) times daily as needed for cough.   budesonide-formoterol 160-4.5 MCG/ACT inhaler Commonly known as:  SYMBICORT Inhale 2 puffs into the lungs 2 (two) times daily for 28 days.   cholecalciferol 1000 units tablet Commonly known as:  VITAMIN D Take 1,000 Units by mouth daily.   clonazePAM 0.5 MG tablet Commonly known as:  KLONOPIN TAKE 1 TABLET BY MOUTH TWICE DAILY AS NEEDED FOR ANXIETY   fluticasone 50 MCG/ACT nasal spray Commonly known as:  FLONASE Place 1 spray into the nose 2 (two) times daily.   folic acid 1 MG tablet Commonly known as:  FOLVITE Take 1 tablet (1 mg total) by mouth daily.   FUSION PLUS Caps Take 1 tablet by mouth daily.   hydroxyurea 500 MG capsule Commonly known as:  HYDREA Take 1 capsule (500 mg total) by mouth 3 (three) times daily before meals.   Magnesium 200 MG Tabs Take 1 tablet by mouth 2 (two) times daily.   methotrexate 2.5 MG tablet Commonly known as:  RHEUMATREX Take 2.5 mg by mouth.   omeprazole 20 MG capsule Commonly known as:  PRILOSEC TAKE 1 TABLET  TWICE A DAY.   pravastatin 40 MG tablet Commonly known as:  PRAVACHOL Take 40 mg by mouth daily.   predniSONE 10 MG tablet Commonly known as:  DELTASONE 4 tabs for 2 days, then 3 tabs for 2 days, 2 tabs for 2 days, then 1 tab for 2 days, then stop   rOPINIRole 1 MG tablet Commonly known as:  REQUIP Take 1.5 mg by mouth at bedtime.   sertraline 50 MG tablet Commonly known as:  ZOLOFT Take 50 mg by mouth daily.       Allergies:  Allergies  Allergen Reactions  . Penicillins Itching and Rash    Past Medical History, Surgical history, Social history, and Family History were reviewed and updated.  Review of Systems: Review of Systems  Constitutional: Negative.   Eyes: Negative.   Respiratory: Positive for shortness of breath and  wheezing.   Cardiovascular: Negative.   Gastrointestinal: Negative.   Genitourinary: Negative.   Musculoskeletal: Negative.   Skin: Negative.   Neurological: Positive for tingling.  Endo/Heme/Allergies: Negative.   Psychiatric/Behavioral: Negative.      Physical Exam:  weight is 170 lb 12 oz (77.5 kg). Her oral temperature is 98.2 F (36.8 C). Her blood pressure is 123/75 and her pulse is 80. Her respiration is 18 and oxygen saturation is 100%.   Wt Readings from Last 3 Encounters:  10/26/18 170 lb 12 oz (77.5 kg)  09/25/18 169 lb (76.7 kg)  09/20/18 169 lb 12.8 oz (77 kg)    Physical Exam  Constitutional: She is oriented to person, place, and time.  HENT:  Head: Normocephalic and atraumatic.  Mouth/Throat: Oropharynx is clear and moist.  Eyes: Pupils are equal, round, and reactive to light. EOM are normal.  Neck: Normal range of motion.  Cardiovascular: Normal rate, regular rhythm and normal heart sounds.  Pulmonary/Chest: Effort normal and breath sounds normal.  Abdominal: Soft. Bowel sounds are normal.  Musculoskeletal: Normal range of motion. She exhibits no edema, tenderness or deformity.  Lymphadenopathy:    She has no cervical adenopathy.  Neurological: She is alert and oriented to person, place, and time.  Skin: Skin is warm and dry. No rash noted. No erythema.  Psychiatric: She has a normal mood and affect. Her behavior is normal. Judgment and thought content normal.  Vitals reviewed.   Lab Results  Component Value Date   WBC 4.0 10/26/2018   HGB 10.2 (L) 10/26/2018   HCT 33.3 (L) 10/26/2018   MCV 90.7 10/26/2018   PLT 304 10/26/2018   Lab Results  Component Value Date   FERRITIN 90 04/27/2018   IRON 105 04/27/2018   TIBC 224 (L) 04/27/2018   UIBC 119 04/27/2018   IRONPCTSAT 47 04/27/2018   Lab Results  Component Value Date   RETICCTPCT 0.8 04/27/2018   RBC 3.67 (L) 10/26/2018   RETICCTABS 73.1 08/06/2015   No results found for: KPAFRELGTCHN,  LAMBDASER, KAPLAMBRATIO No results found for: IGGSERUM, IGA, IGMSERUM No results found for: Odetta Pink, SPEI   Chemistry      Component Value Date/Time   NA 138 04/27/2018 0833   NA 145 11/24/2017 0754   NA 142 05/25/2017 0755   K 3.9 04/27/2018 0833   K 3.7 11/24/2017 0754   K 3.9 05/25/2017 0755   CL 103 04/27/2018 0833   CL 104 11/24/2017 0754   CO2 24 04/27/2018 0833   CO2 27 11/24/2017 0754   CO2 24 05/25/2017 0755   BUN  11 04/27/2018 0833   BUN 12 11/24/2017 0754   BUN 13.3 05/25/2017 0755   CREATININE 0.89 04/27/2018 0833   CREATININE 1.0 11/24/2017 0754   CREATININE 0.8 05/25/2017 0755      Component Value Date/Time   CALCIUM 9.3 04/27/2018 0833   CALCIUM 9.1 11/24/2017 0754   CALCIUM 9.5 05/25/2017 0755   ALKPHOS 60 04/27/2018 0833   ALKPHOS 70 11/24/2017 0754   ALKPHOS 74 05/25/2017 0755   AST 25 04/27/2018 0833   AST 21 05/25/2017 0755   ALT 12 04/27/2018 0833   ALT 18 11/24/2017 0754   ALT 14 05/25/2017 0755   BILITOT 0.4 04/27/2018 0833   BILITOT 0.37 05/25/2017 0755      Impression and Plan: [name] is a very pleasant 72 yo African American female with essential thrombocythemia, JAK2 positive.   Again, I think she is doing quite well.  Her blood count is responding nicely.  I do worry about the left arm issue.  She is on the baby aspirin.  I told her that if this happens again, that she really needs to go to the emergency room.  She is on quite a few medications for rheumatologic issues.  I will see what her iron studies show.  If her iron is low, then we will send in a prescription for oral iron.  I would like to see her back in about 2 months.  If her lately count continues to fall, I will adjust her Hydrea dose.  [name], MD 11/22/201910:18 AM

## 2018-12-07 ENCOUNTER — Inpatient Hospital Stay (HOSPITAL_BASED_OUTPATIENT_CLINIC_OR_DEPARTMENT_OTHER): Payer: Medicare Other | Admitting: Hematology & Oncology

## 2018-12-07 ENCOUNTER — Other Ambulatory Visit: Payer: Self-pay

## 2018-12-07 ENCOUNTER — Encounter: Payer: Self-pay | Admitting: Hematology & Oncology

## 2018-12-07 ENCOUNTER — Inpatient Hospital Stay: Payer: Medicare Other | Attending: Hematology & Oncology

## 2018-12-07 VITALS — BP 129/70 | HR 92 | Temp 98.2°F | Resp 16 | Wt 169.0 lb

## 2018-12-07 DIAGNOSIS — D473 Essential (hemorrhagic) thrombocythemia: Secondary | ICD-10-CM

## 2018-12-07 DIAGNOSIS — Z79899 Other long term (current) drug therapy: Secondary | ICD-10-CM | POA: Diagnosis not present

## 2018-12-07 DIAGNOSIS — D573 Sickle-cell trait: Secondary | ICD-10-CM | POA: Diagnosis not present

## 2018-12-07 DIAGNOSIS — R899 Unspecified abnormal finding in specimens from other organs, systems and tissues: Secondary | ICD-10-CM

## 2018-12-07 DIAGNOSIS — Z7982 Long term (current) use of aspirin: Secondary | ICD-10-CM | POA: Insufficient documentation

## 2018-12-07 LAB — CBC WITH DIFFERENTIAL (CANCER CENTER ONLY)
Abs Immature Granulocytes: 0.01 10*3/uL (ref 0.00–0.07)
Basophils Absolute: 0 10*3/uL (ref 0.0–0.1)
Basophils Relative: 0 %
EOS PCT: 1 %
Eosinophils Absolute: 0 10*3/uL (ref 0.0–0.5)
HCT: 32.2 % — ABNORMAL LOW (ref 36.0–46.0)
HEMOGLOBIN: 9.7 g/dL — AB (ref 12.0–15.0)
Immature Granulocytes: 0 %
LYMPHS PCT: 34 %
Lymphs Abs: 1.2 10*3/uL (ref 0.7–4.0)
MCH: 27.7 pg (ref 26.0–34.0)
MCHC: 30.1 g/dL (ref 30.0–36.0)
MCV: 92 fL (ref 80.0–100.0)
MONO ABS: 0.2 10*3/uL (ref 0.1–1.0)
MONOS PCT: 7 %
Neutro Abs: 2 10*3/uL (ref 1.7–7.7)
Neutrophils Relative %: 58 %
Platelet Count: 375 10*3/uL (ref 150–400)
RBC: 3.5 MIL/uL — AB (ref 3.87–5.11)
RDW: 19.7 % — ABNORMAL HIGH (ref 11.5–15.5)
WBC: 3.4 10*3/uL — AB (ref 4.0–10.5)
nRBC: 0 % (ref 0.0–0.2)

## 2018-12-07 LAB — RETICULOCYTES
Immature Retic Fract: 31.3 % — ABNORMAL HIGH (ref 2.3–15.9)
RBC.: 3.5 MIL/uL — ABNORMAL LOW (ref 3.87–5.11)
Retic Count, Absolute: 66.9 10*3/uL (ref 19.0–186.0)
Retic Ct Pct: 1.9 % (ref 0.4–3.1)

## 2018-12-07 LAB — CMP (CANCER CENTER ONLY)
ALK PHOS: 67 U/L (ref 38–126)
ALT: 12 U/L (ref 0–44)
ANION GAP: 8 (ref 5–15)
AST: 13 U/L — ABNORMAL LOW (ref 15–41)
Albumin: 3.8 g/dL (ref 3.5–5.0)
BILIRUBIN TOTAL: 0.3 mg/dL (ref 0.3–1.2)
BUN: 10 mg/dL (ref 8–23)
CALCIUM: 8.9 mg/dL (ref 8.9–10.3)
CO2: 26 mmol/L (ref 22–32)
CREATININE: 0.92 mg/dL (ref 0.44–1.00)
Chloride: 107 mmol/L (ref 98–111)
Glucose, Bld: 106 mg/dL — ABNORMAL HIGH (ref 70–99)
Potassium: 3.9 mmol/L (ref 3.5–5.1)
SODIUM: 141 mmol/L (ref 135–145)
TOTAL PROTEIN: 6.3 g/dL — AB (ref 6.5–8.1)

## 2018-12-07 LAB — SAMPLE TO BLOOD BANK

## 2018-12-07 LAB — SAVE SMEAR(SSMR), FOR PROVIDER SLIDE REVIEW

## 2018-12-07 LAB — LACTATE DEHYDROGENASE: LDH: 240 U/L — AB (ref 98–192)

## 2018-12-07 NOTE — Progress Notes (Signed)
Hematology and Oncology Follow Up Visit  [name] [id number] 1946/10/22 73 y.o. 12/07/2018   Principle Diagnosis:  Essential thrombocythemia-JAK2 POSITIVE Sickle cell trait  Current Therapy:   Hydrea 500 mg PO BID -dose changed on 52/77/8242 Folic acid 1 mg by mouth daily Aspirin 81 mg daily    Interim History:  [name] is here today for follow-up.  She is doing pretty well.  She is having some issues with the rheumatism.  She is on methotrexate.  She does not think that is helping her joint pain all that much.  She actually stopped taking it a couple weeks ago.  She sees a rheumatologist in April and she thinks.  She did enjoy the holidays.  She was with her family which was nice.  They attended church quite a bit.  This really makes her happy.  She is doing okay on the Hydrea.  I am going to actually cut her dose back to twice a day administration.  Hopefully, this will still maintain her platelets and help with her white cells and red cells.  She has had no issues with fever.  There is been no bleeding.  She is had no change in bowel or bladder habits.  Overall, her performance status is ECOG 1.  Medications:  Allergies as of 12/07/2018      Reactions   Penicillins Itching, Rash      Medication List       Accurate as of December 07, 2018 11:26 AM. Always use your most recent med list.        albuterol (2.5 MG/3ML) 0.083% nebulizer solution Commonly known as:  PROVENTIL Take 2.5 mg by nebulization every 4 (four) hours as needed for shortness of breath. Reported on 12/30/2015   albuterol 108 (90 Base) MCG/ACT inhaler Commonly known as:  PROVENTIL HFA Inhale 2 puffs into the lungs every 4 (four) hours as needed for wheezing or shortness of breath. Reported on 12/30/2015   aspirin EC 81 MG tablet Take 81 mg by mouth daily.   budesonide-formoterol 160-4.5 MCG/ACT inhaler Commonly known as:  SYMBICORT Inhale 2 puffs into the lungs 2 (two) times daily for 28  days.   cholecalciferol 1000 units tablet Commonly known as:  VITAMIN D Take 1,000 Units by mouth daily.   clonazePAM 0.5 MG tablet Commonly known as:  KLONOPIN TAKE 1 TABLET BY MOUTH TWICE DAILY AS NEEDED FOR ANXIETY   fluticasone 50 MCG/ACT nasal spray Commonly known as:  FLONASE Place 1 spray into the nose 2 (two) times daily.   folic acid 1 MG tablet Commonly known as:  FOLVITE Take 1 tablet (1 mg total) by mouth daily.   FUSION PLUS Caps Take 1 tablet by mouth daily.   hydroxyurea 500 MG capsule Commonly known as:  HYDREA Take 1 capsule (500 mg total) by mouth 3 (three) times daily before meals.   Magnesium 200 MG Tabs Take 1 tablet by mouth 2 (two) times daily.   methotrexate 2.5 MG tablet Commonly known as:  RHEUMATREX Take 2.5 mg by mouth.   omeprazole 20 MG capsule Commonly known as:  PRILOSEC TAKE 1 TABLET TWICE A DAY.   pravastatin 40 MG tablet Commonly known as:  PRAVACHOL Take 40 mg by mouth daily.   rOPINIRole 1 MG tablet Commonly known as:  REQUIP Take 1.5 mg by mouth at bedtime.   sertraline 50 MG tablet Commonly known as:  ZOLOFT Take 50 mg by mouth daily.       Allergies:  Allergies  Allergen Reactions  . Penicillins Itching and Rash    Past Medical History, Surgical history, Social history, and Family History were reviewed and updated.  Review of Systems: Review of Systems  Constitutional: Negative.   Eyes: Negative.   Respiratory: Positive for shortness of breath and wheezing.   Cardiovascular: Negative.   Gastrointestinal: Negative.   Genitourinary: Negative.   Musculoskeletal: Negative.   Skin: Negative.   Neurological: Positive for tingling.  Endo/Heme/Allergies: Negative.   Psychiatric/Behavioral: Negative.      Physical Exam:  weight is 169 lb (76.7 kg). Her oral temperature is 98.2 F (36.8 C). Her blood pressure is 129/70 and her pulse is 92. Her respiration is 16 and oxygen saturation is 100%.   Wt Readings  from Last 3 Encounters:  12/07/18 169 lb (76.7 kg)  10/26/18 170 lb 12 oz (77.5 kg)  09/25/18 169 lb (76.7 kg)    Physical Exam Vitals signs reviewed.  HENT:     Head: Normocephalic and atraumatic.  Eyes:     Pupils: Pupils are equal, round, and reactive to light.  Neck:     Musculoskeletal: Normal range of motion.  Cardiovascular:     Rate and Rhythm: Normal rate and regular rhythm.     Heart sounds: Normal heart sounds.  Pulmonary:     Effort: Pulmonary effort is normal.     Breath sounds: Normal breath sounds.  Abdominal:     General: Bowel sounds are normal.     Palpations: Abdomen is soft.  Musculoskeletal: Normal range of motion.        General: No tenderness or deformity.  Lymphadenopathy:     Cervical: No cervical adenopathy.  Skin:    General: Skin is warm and dry.     Findings: No erythema or rash.  Neurological:     Mental Status: She is alert and oriented to person, place, and time.  Psychiatric:        Behavior: Behavior normal.        Thought Content: Thought content normal.        Judgment: Judgment normal.     Lab Results  Component Value Date   WBC 3.4 (L) 12/07/2018   HGB 9.7 (L) 12/07/2018   HCT 32.2 (L) 12/07/2018   MCV 92.0 12/07/2018   PLT 375 12/07/2018   Lab Results  Component Value Date   FERRITIN 90 04/27/2018   IRON 105 04/27/2018   TIBC 224 (L) 04/27/2018   UIBC 119 04/27/2018   IRONPCTSAT 47 04/27/2018   Lab Results  Component Value Date   RETICCTPCT 1.9 12/07/2018   RBC 3.50 (L) 12/07/2018   RBC 3.50 (L) 12/07/2018   RETICCTABS 73.1 08/06/2015   No results found for: KPAFRELGTCHN, LAMBDASER, KAPLAMBRATIO No results found for: IGGSERUM, IGA, IGMSERUM No results found for: Odetta Pink, SPEI   Chemistry      Component Value Date/Time   NA 141 12/07/2018 1015   NA 145 11/24/2017 0754   NA 142 05/25/2017 0755   K 3.9 12/07/2018 1015   K 3.7 11/24/2017 0754   K 3.9  05/25/2017 0755   CL 107 12/07/2018 1015   CL 104 11/24/2017 0754   CO2 26 12/07/2018 1015   CO2 27 11/24/2017 0754   CO2 24 05/25/2017 0755   BUN 10 12/07/2018 1015   BUN 12 11/24/2017 0754   BUN 13.3 05/25/2017 0755   CREATININE 0.92 12/07/2018 1015   CREATININE 1.0 11/24/2017 0754  CREATININE 0.8 05/25/2017 0755      Component Value Date/Time   CALCIUM 8.9 12/07/2018 1015   CALCIUM 9.1 11/24/2017 0754   CALCIUM 9.5 05/25/2017 0755   ALKPHOS 67 12/07/2018 1015   ALKPHOS 70 11/24/2017 0754   ALKPHOS 74 05/25/2017 0755   AST 13 (L) 12/07/2018 1015   AST 21 05/25/2017 0755   ALT 12 12/07/2018 1015   ALT 18 11/24/2017 0754   ALT 14 05/25/2017 0755   BILITOT 0.3 12/07/2018 1015   BILITOT 0.37 05/25/2017 0755      Impression and Plan: Ms. Andreatta is a very pleasant 73 yo African American female with essential thrombocythemia, JAK2 positive.   Again, hopefully the change in Hydrea dose will help her white cells and red cells.  I do think that the methotrexate is having an effect also.  I will see her back in about 5 weeks.  At that time, we will see how everything looks and continue to make dosage adjustments if needed.  Again, I know that she has really had a tough time over the past year or so.  There was tragedy that hit the family in the past couple years.  Thankfully, her faith has maintained itself nicely.    [name], MD 1/3/202011:26 AM

## 2018-12-10 LAB — IRON AND TIBC
Iron: 97 ug/dL (ref 41–142)
Saturation Ratios: 45 % (ref 21–57)
TIBC: 219 ug/dL — ABNORMAL LOW (ref 236–444)
UIBC: 121 ug/dL (ref 120–384)

## 2018-12-10 LAB — HEMOGLOBINOPATHY EVALUATION
HGB VARIANT: 0 %
Hgb A2 Quant: 4.1 % — ABNORMAL HIGH (ref 1.8–3.2)
Hgb A: 63.6 % — ABNORMAL LOW (ref 96.4–98.8)
Hgb C: 0 %
Hgb F Quant: 8.5 % — ABNORMAL HIGH (ref 0.0–2.0)
Hgb S Quant: 23.8 % — ABNORMAL HIGH

## 2018-12-10 LAB — FERRITIN: Ferritin: 114 ng/mL (ref 11–307)

## 2019-01-10 ENCOUNTER — Telehealth: Payer: Self-pay | Admitting: Hematology & Oncology

## 2019-01-10 NOTE — Telephone Encounter (Signed)
Spoke with patient regarding r/s 2/7 appt .  She is sick and wanted to moved appointment out unitl March per 2/6 verbal from Dr Johnette Abraham

## 2019-01-11 ENCOUNTER — Ambulatory Visit: Payer: Medicare Other | Admitting: Family

## 2019-01-11 ENCOUNTER — Other Ambulatory Visit: Payer: Medicare Other

## 2019-01-11 ENCOUNTER — Ambulatory Visit: Payer: Medicare Other | Admitting: Hematology & Oncology

## 2019-02-05 ENCOUNTER — Ambulatory Visit (HOSPITAL_BASED_OUTPATIENT_CLINIC_OR_DEPARTMENT_OTHER)
Admission: RE | Admit: 2019-02-05 | Discharge: 2019-02-05 | Disposition: A | Discharge status: Home / Self Care | Payer: Medicare Other | Source: Ambulatory Visit | Attending: Family | Admitting: Family

## 2019-02-05 ENCOUNTER — Telehealth: Payer: Self-pay | Admitting: *Deleted

## 2019-02-05 ENCOUNTER — Inpatient Hospital Stay: Payer: Medicare Other | Attending: Hematology & Oncology

## 2019-02-05 ENCOUNTER — Inpatient Hospital Stay (HOSPITAL_BASED_OUTPATIENT_CLINIC_OR_DEPARTMENT_OTHER): Payer: Medicare Other | Admitting: Family

## 2019-02-05 ENCOUNTER — Other Ambulatory Visit: Payer: Self-pay

## 2019-02-05 VITALS — BP 134/68 | HR 76 | Temp 98.1°F | Resp 18 | Ht 62.0 in | Wt 176.4 lb

## 2019-02-05 DIAGNOSIS — R05 Cough: Secondary | ICD-10-CM | POA: Diagnosis not present

## 2019-02-05 DIAGNOSIS — D473 Essential (hemorrhagic) thrombocythemia: Secondary | ICD-10-CM | POA: Diagnosis not present

## 2019-02-05 DIAGNOSIS — R059 Cough, unspecified: Secondary | ICD-10-CM

## 2019-02-05 DIAGNOSIS — Z79899 Other long term (current) drug therapy: Secondary | ICD-10-CM

## 2019-02-05 DIAGNOSIS — R0602 Shortness of breath: Secondary | ICD-10-CM | POA: Insufficient documentation

## 2019-02-05 DIAGNOSIS — D573 Sickle-cell trait: Secondary | ICD-10-CM | POA: Diagnosis not present

## 2019-02-05 DIAGNOSIS — Z1589 Genetic susceptibility to other disease: Secondary | ICD-10-CM

## 2019-02-05 DIAGNOSIS — D500 Iron deficiency anemia secondary to blood loss (chronic): Secondary | ICD-10-CM

## 2019-02-05 LAB — FERRITIN: FERRITIN: 67 ng/mL (ref 11–307)

## 2019-02-05 LAB — CBC WITH DIFFERENTIAL (CANCER CENTER ONLY)
Abs Immature Granulocytes: 0.06 10*3/uL (ref 0.00–0.07)
Basophils Absolute: 0 10*3/uL (ref 0.0–0.1)
Basophils Relative: 0 %
Eosinophils Absolute: 0.1 10*3/uL (ref 0.0–0.5)
Eosinophils Relative: 1 %
HCT: 35.8 % — ABNORMAL LOW (ref 36.0–46.0)
Hemoglobin: 11.4 g/dL — ABNORMAL LOW (ref 12.0–15.0)
Immature Granulocytes: 1 %
Lymphocytes Relative: 25 %
Lymphs Abs: 2.3 10*3/uL (ref 0.7–4.0)
MCH: 27.4 pg (ref 26.0–34.0)
MCHC: 31.8 g/dL (ref 30.0–36.0)
MCV: 86.1 fL (ref 80.0–100.0)
Monocytes Absolute: 0.6 10*3/uL (ref 0.1–1.0)
Monocytes Relative: 6 %
NEUTROS PCT: 67 %
Neutro Abs: 6.4 10*3/uL (ref 1.7–7.7)
Platelet Count: 390 10*3/uL (ref 150–400)
RBC: 4.16 MIL/uL (ref 3.87–5.11)
RDW: 17.1 % — ABNORMAL HIGH (ref 11.5–15.5)
WBC Count: 9.5 10*3/uL (ref 4.0–10.5)
nRBC: 0 % (ref 0.0–0.2)

## 2019-02-05 LAB — CMP (CANCER CENTER ONLY)
ALK PHOS: 56 U/L (ref 38–126)
ALT: 12 U/L (ref 0–44)
ANION GAP: 8 (ref 5–15)
AST: 15 U/L (ref 15–41)
Albumin: 4.1 g/dL (ref 3.5–5.0)
BUN: 14 mg/dL (ref 8–23)
CO2: 27 mmol/L (ref 22–32)
Calcium: 9.2 mg/dL (ref 8.9–10.3)
Chloride: 106 mmol/L (ref 98–111)
Creatinine: 0.86 mg/dL (ref 0.44–1.00)
GFR, Est AFR Am: >60 mL/min (ref >60)
GFR, Estimated: >60 mL/min (ref >60)
GLUCOSE: 83 mg/dL (ref 70–99)
Potassium: 3.5 mmol/L (ref 3.5–5.1)
Sodium: 141 mmol/L (ref 135–145)
Total Bilirubin: 0.5 mg/dL (ref 0.3–1.2)
Total Protein: 6.6 g/dL (ref 6.5–8.1)

## 2019-02-05 LAB — IRON AND TIBC
Iron: 61 ug/dL (ref 41–142)
Saturation Ratios: 25 % (ref 21–57)
TIBC: 245 ug/dL (ref 236–444)
UIBC: 184 ug/dL (ref 120–384)

## 2019-02-05 LAB — SAVE SMEAR (SSMR)

## 2019-02-05 LAB — LACTATE DEHYDROGENASE: LDH: 311 U/L — ABNORMAL HIGH (ref 98–192)

## 2019-02-05 NOTE — Progress Notes (Signed)
Hematology and Oncology Follow Up Visit  [name] [id number] 03/07/1946 73 y.o. 02/05/2019   Principle Diagnosis:  Essential thrombocythemia - JAK2 POSITIVE Sickle cell trait  Current Therapy:   Hydrea 500 mg PO BID - dose changed on 69/45/0388 Folic acid 1 mg by mouth daily Aspirin 81 mg daily    Interim History:  [name] is here today for follow-up. She has a cough with yellow phlegm, SOB with exacerbation of asthma and fatigue.  No fever, chills, n/v, rash, dizziness, chest pain, palpitations, abdominal pain or changes in bowel or bladder habits.  She states that she is taking her Hydrea 500 mg PO BID as prescribed. Hgb stable at 11.4, WBC 9.5 and platelet count 390.  No swelling, tenderness, numbness or tingling in her extremities.  No lymphadenopathy noted on exam.  No episodes of bleeding, no bruising or petechiae.  She has maintained a good appetite and is staying hydrated. Her weight is stable.   ECOG Performance Status: 1 - Symptomatic but completely ambulatory  Medications:  Allergies as of 02/05/2019      Reactions   Penicillins Itching, Rash      Medication List       Accurate as of February 05, 2019  8:47 AM. Always use your most recent med list.        albuterol (2.5 MG/3ML) 0.083% nebulizer solution Commonly known as:  PROVENTIL Take 2.5 mg by nebulization every 4 (four) hours as needed for shortness of breath. Reported on 12/30/2015   albuterol 108 (90 Base) MCG/ACT inhaler Commonly known as:  PROVENTIL HFA Inhale 2 puffs into the lungs every 4 (four) hours as needed for wheezing or shortness of breath. Reported on 12/30/2015   aspirin EC 81 MG tablet Take 81 mg by mouth daily.   budesonide-formoterol 160-4.5 MCG/ACT inhaler Commonly known as:  SYMBICORT Inhale 2 puffs into the lungs 2 (two) times daily for 28 days.   cholecalciferol 1000 units tablet Commonly known as:  VITAMIN D Take 1,000 Units by mouth daily.   clonazePAM 0.5 MG  tablet Commonly known as:  KLONOPIN TAKE 1 TABLET BY MOUTH TWICE DAILY AS NEEDED FOR ANXIETY   fluticasone 50 MCG/ACT nasal spray Commonly known as:  FLONASE Place 1 spray into the nose 2 (two) times daily.   folic acid 1 MG tablet Commonly known as:  FOLVITE Take 1 tablet (1 mg total) by mouth daily.   FUSION PLUS Caps Take 1 tablet by mouth daily.   hydroxyurea 500 MG capsule Commonly known as:  HYDREA Take 1 capsule (500 mg total) by mouth 3 (three) times daily before meals.   Magnesium 200 MG Tabs Take 1 tablet by mouth 2 (two) times daily.   methotrexate 2.5 MG tablet Commonly known as:  RHEUMATREX Take 2.5 mg by mouth.   omeprazole 20 MG capsule Commonly known as:  PRILOSEC TAKE 1 TABLET TWICE A DAY.   pravastatin 40 MG tablet Commonly known as:  PRAVACHOL Take 40 mg by mouth daily.   rOPINIRole 1 MG tablet Commonly known as:  REQUIP Take 1.5 mg by mouth at bedtime.   sertraline 50 MG tablet Commonly known as:  ZOLOFT Take 50 mg by mouth daily.       Allergies:  Allergies  Allergen Reactions  . Penicillins Itching and Rash    Past Medical History, Surgical history, Social history, and Family History were reviewed and updated.  Review of Systems: All other 10 point review of systems is negative.  Physical Exam:  vitals were not taken for this visit.   Wt Readings from Last 3 Encounters:  12/07/18 169 lb (76.7 kg)  10/26/18 170 lb 12 oz (77.5 kg)  09/25/18 169 lb (76.7 kg)    Ocular: Sclerae unicteric, pupils equal, round and reactive to light Ear-nose-throat: Oropharynx clear, dentition fair Lymphatic: No cervical, supraclavicular or axillary adenopathy Lungs no rales or rhonchi, good excursion bilaterally Heart regular rate and rhythm, no murmur appreciated Abd soft, nontender, positive bowel sounds, no liver or spleen tip palpated on exam, no fluid wave  MSK no focal spinal tenderness, no joint edema Neuro: non-focal, well-oriented,  appropriate affect Breasts: Deferred   Lab Results  Component Value Date   WBC 9.5 02/05/2019   HGB 11.4 (L) 02/05/2019   HCT 35.8 (L) 02/05/2019   MCV 86.1 02/05/2019   PLT 390 02/05/2019   Lab Results  Component Value Date   FERRITIN 114 12/07/2018   IRON 97 12/07/2018   TIBC 219 (L) 12/07/2018   UIBC 121 12/07/2018   IRONPCTSAT 45 12/07/2018   Lab Results  Component Value Date   RETICCTPCT 1.9 12/07/2018   RBC 4.16 02/05/2019   RETICCTABS 73.1 08/06/2015   No results found for: KPAFRELGTCHN, LAMBDASER, KAPLAMBRATIO No results found for: IGGSERUM, IGA, IGMSERUM No results found for: Kathrynn Ducking, MSPIKE, SPEI   Chemistry      Component Value Date/Time   NA 141 02/05/2019 0803   NA 145 11/24/2017 0754   NA 142 05/25/2017 0755   K 3.5 02/05/2019 0803   K 3.7 11/24/2017 0754   K 3.9 05/25/2017 0755   CL 106 02/05/2019 0803   CL 104 11/24/2017 0754   CO2 27 02/05/2019 0803   CO2 27 11/24/2017 0754   CO2 24 05/25/2017 0755   BUN 14 02/05/2019 0803   BUN 12 11/24/2017 0754   BUN 13.3 05/25/2017 0755   CREATININE 0.86 02/05/2019 0803   CREATININE 1.0 11/24/2017 0754   CREATININE 0.8 05/25/2017 0755      Component Value Date/Time   CALCIUM 9.2 02/05/2019 0803   CALCIUM 9.1 11/24/2017 0754   CALCIUM 9.5 05/25/2017 0755   ALKPHOS 56 02/05/2019 0803   ALKPHOS 70 11/24/2017 0754   ALKPHOS 74 05/25/2017 0755   AST 15 02/05/2019 0803   AST 21 05/25/2017 0755   ALT 12 02/05/2019 0803   ALT 18 11/24/2017 0754   ALT 14 05/25/2017 0755   BILITOT 0.5 02/05/2019 0803   BILITOT 0.37 05/25/2017 0755       Impression and Plan: [name] is a very pleasant 73 yo African American female with essential thrombocythemia, JAK2 positive as well as the sickle cell trait. She appears to have a respiratory infection today and states that she does not see her PCP until 3/25. We will get a chest xray today to assess for  bronchitis/pneumonia.   She will continue her same regimen with Hydrea BID.  We will plan to see her back in another 8 weeks for MD follow-up.  She will contact our office with any questions or concerns. We can certainly see her sooner if need be.    Laverna Peace, NP 3/3/20208:47 AM

## 2019-02-05 NOTE — Telephone Encounter (Signed)
Patient notified per order of S. [place] NP that CXR is negative.  Patient appreciative of call and has no questions or concerns at this time.

## 2019-02-11 DIAGNOSIS — J4541 Moderate persistent asthma with (acute) exacerbation: Secondary | ICD-10-CM | POA: Diagnosis not present

## 2019-02-26 ENCOUNTER — Telehealth: Payer: Self-pay | Admitting: Emergency Medicine

## 2019-02-26 DIAGNOSIS — D473 Essential (hemorrhagic) thrombocythemia: Secondary | ICD-10-CM | POA: Diagnosis not present

## 2019-02-26 DIAGNOSIS — K219 Gastro-esophageal reflux disease without esophagitis: Secondary | ICD-10-CM | POA: Diagnosis not present

## 2019-02-26 DIAGNOSIS — E782 Mixed hyperlipidemia: Secondary | ICD-10-CM | POA: Diagnosis not present

## 2019-02-26 DIAGNOSIS — Z0000 Encounter for general adult medical examination without abnormal findings: Secondary | ICD-10-CM | POA: Diagnosis not present

## 2019-02-26 NOTE — Telephone Encounter (Signed)
Spoke with pt. She is requesting samples of Symbicort. I have advised her of the new protocol we have in place for samples at this time. Nothing further was needed at this time.

## 2019-02-26 NOTE — Telephone Encounter (Signed)
Patient is returning phone call.  Patient phone number is [phone].

## 2019-02-26 NOTE — Telephone Encounter (Signed)
Unable to reach LMTCB 

## 2019-03-06 DIAGNOSIS — Z0000 Encounter for general adult medical examination without abnormal findings: Secondary | ICD-10-CM | POA: Diagnosis not present

## 2019-03-06 DIAGNOSIS — M059 Rheumatoid arthritis with rheumatoid factor, unspecified: Secondary | ICD-10-CM | POA: Diagnosis not present

## 2019-03-06 DIAGNOSIS — R1030 Lower abdominal pain, unspecified: Secondary | ICD-10-CM | POA: Diagnosis not present

## 2019-03-06 DIAGNOSIS — E782 Mixed hyperlipidemia: Secondary | ICD-10-CM | POA: Diagnosis not present

## 2019-04-01 ENCOUNTER — Telehealth: Payer: Self-pay | Admitting: Emergency Medicine

## 2019-04-01 ENCOUNTER — Ambulatory Visit (INDEPENDENT_AMBULATORY_CARE_PROVIDER_SITE_OTHER): Payer: Medicare Other | Admitting: Nurse Practitioner

## 2019-04-01 ENCOUNTER — Other Ambulatory Visit: Payer: Self-pay

## 2019-04-01 ENCOUNTER — Encounter: Payer: Self-pay | Admitting: Nurse Practitioner

## 2019-04-01 DIAGNOSIS — J383 Other diseases of vocal cords: Secondary | ICD-10-CM | POA: Diagnosis not present

## 2019-04-01 DIAGNOSIS — J45909 Unspecified asthma, uncomplicated: Secondary | ICD-10-CM

## 2019-04-01 DIAGNOSIS — J209 Acute bronchitis, unspecified: Secondary | ICD-10-CM | POA: Insufficient documentation

## 2019-04-01 DIAGNOSIS — J301 Allergic rhinitis due to pollen: Secondary | ICD-10-CM

## 2019-04-01 MED ORDER — FLUCONAZOLE 150 MG PO TABS: 150.0000 mg | ORAL_TABLET | Freq: Every day | ORAL | 0 refills | Status: AC

## 2019-04-01 MED ORDER — PREDNISONE 10 MG PO TABS: ORAL_TABLET | ORAL | 0 refills | Status: DC

## 2019-04-01 MED ORDER — AZITHROMYCIN 250 MG PO TABS: ORAL_TABLET | ORAL | 0 refills | Status: DC

## 2019-04-01 MED ORDER — MONTELUKAST SODIUM 10 MG PO TABS: 10.0000 mg | ORAL_TABLET | Freq: Every day | ORAL | 11 refills | Status: DC

## 2019-04-01 NOTE — Progress Notes (Signed)
Virtual Visit via Telephone Note  I connected with [name] on 04/01/19 at  9:30 AM EDT by telephone and verified that I am speaking with the correct person using two identifiers.   I discussed the limitations, risks, security and privacy concerns of performing an evaluation and management service by telephone and the availability of in person appointments. I also discussed with the patient that there may be a patient responsible charge related to this service. The patient expressed understanding and agreed to proceed.   History of Present Illness: 73 year old female former smoker with asthma, vocal cord dysfunction, GERD, allergic rhinitis, and history of esophageal stricture who is followed by Dr. Lamonte Sakai.  Patient has a tele-visit today for an acute visit.  She states that over the past month she has been having increased shortness of breath, wheezing, and cough.  She states that her cough is nonproductive.  She states that at the beginning of the month her PCP did prescribe a prednisone taper.  She states that this did help but her symptoms have returned.  Over the last few days she has had increased wheezing and shortness of breath.  She denies any recent fever.  Patient denies any lower extremity edema.  She has been staying isolated due to risk of COVID exposure.  Patient has been compliant with her Symbicort and albuterol as needed. Denies f/c/s, n/v/d, hemoptysis, PND, leg swelling.    Observations/Objective:  CXR 04/01/19 - No active cardiopulmonary disease.  Stable small hiatal hernia.  PFT: PFT Results Latest Ref Rng & Units 02/03/2017  FVC-Pre L 1.96  FVC-Predicted Pre % 89  FVC-Post L 2.44  FVC-Predicted Post % 110  Pre FEV1/FVC % % 56  Post FEV1/FCV % % 61  FEV1-Pre L 1.11  FEV1-Predicted Pre % 65  FEV1-Post L 1.48  DLCO UNC% % 90  DLCO COR %Predicted % 114  TLC L 6.30  TLC % Predicted % 128  RV % Predicted % 199     Assessment and Plan: Intrinsic asthma with  exacerbation/bronchitis - current exacerbation with shortness of breath, wheezing, and cough: Z-Pak take as directed Prednisone taper take as directed  Mucinex DM twice daily as needed for cough and congestion Will order Singulair daily Symbicort 2 puffs twice daily rinse after use. Albuterol as needed for wheezing and shortness of breath Will order diflucan for yeast per patient request  Allergic rhinitis Continue flonase.   Vocal cord dysfunction Well-controlled at this time. Continue GERD control, rhinitis control.  On methotrexate therapy We will need to follow her clinically and by intermittent imaging.   Follow Up Instructions:  Follow up tele-visit with NP in 2 weeks or as needed  Please contact office for sooner follow up if symptoms do not improve or worsen or seek emergency care     I discussed the assessment and treatment plan with the patient. The patient was provided an opportunity to ask questions and all were answered. The patient agreed with the plan and demonstrated an understanding of the instructions.   The patient was advised to call back or seek an in-person evaluation if the symptoms worsen or if the condition fails to improve as anticipated.  I provided 23 minutes of non-face-to-face time during this encounter.   Fenton Foy, NP

## 2019-04-01 NOTE — Assessment & Plan Note (Signed)
Continue flonase 

## 2019-04-01 NOTE — Assessment & Plan Note (Signed)
Well-controlled at this time. Continue GERD control, rhinitis control.

## 2019-04-01 NOTE — Assessment & Plan Note (Addendum)
Intrinsic asthma with exacerbation/bronchitis - current exacerbation with shortness of breath, wheezing, and cough: Z-Pak take as directed Prednisone taper take as directed  Mucinex DM twice daily as needed for cough and congestion Will order Singulair daily Symbicort 2 puffs twice daily rinse after use. Albuterol as needed for wheezing and shortness of breath

## 2019-04-01 NOTE — Telephone Encounter (Signed)
Primary Pulmonologist: RB Last office visit and with whom: 09/20/18 with TP What do we see them for (pulmonary problems): asthma Last OV assessment/plan: Instructions  Return in about 2 weeks (around 10/04/2018) for Follow up with Dr. Lamonte Sakai.  Z-Pak take as directed Prednisone taper take as directed  Mucinex DM twice daily as needed for cough and congestion Tessalon as needed 3 times daily for cough Symbicort 2 puffs twice daily rinse after use. Chest x-ray today Albuterol as needed for wheezing and shortness of breath Follow up with Dr. Lamonte Sakai or Parrett NP in 2 weeks and As needed  Please contact office for sooner follow up if symptoms do not improve or worsen or seek emergency care       Reason for call: Called and spoke with pt who states she has had complaints of SOB x1 month. Pt states she has been using nebulizer every 6 hours and pt states she does get some relief but states she still has problems with SOB. Pt was prescribed prednisone by PCP which she states she has finished.  Pt also has complaints of a dry cough. Pt denies any complaints of fever. Pt does have some mild chest tightness.   Pt states she is using her symbicort inhaler daily as prescribed but states she has not been doing mucinex. Stated to pt we needed to schedule televisit to further evaluate and pt expressed understanding. televisit scheduled for pt with TN at 9:30 this morning. Nothing further needed.

## 2019-04-01 NOTE — Patient Instructions (Addendum)
Intrinsic asthma with exacerbation/bronchitis - current exacerbation with shortness of breath, wheezing, and cough: Z-Pak take as directed Prednisone taper take as directed  Mucinex DM twice daily as needed for cough and congestion Will order Singulair daily Symbicort 2 puffs twice daily rinse after use. Albuterol as needed for wheezing and shortness of breath Will order diflucan for yeast per patient request  Allergic rhinitis Continue flonase.   Vocal cord dysfunction Well-controlled at this time. Continue GERD control, rhinitis control.  On methotrexate therapy We will need to follow her clinically and by intermittent imaging.    Follow up: Follow up tele-visit with NP in 2 weeks or as needed  Please contact office for sooner follow up if symptoms do not improve or worsen or seek emergency care

## 2019-04-02 ENCOUNTER — Inpatient Hospital Stay: Payer: Medicare Other | Admitting: Hematology & Oncology

## 2019-04-02 ENCOUNTER — Inpatient Hospital Stay: Payer: Medicare Other

## 2019-04-15 ENCOUNTER — Encounter: Payer: Self-pay | Admitting: Adult Health

## 2019-04-15 ENCOUNTER — Ambulatory Visit (INDEPENDENT_AMBULATORY_CARE_PROVIDER_SITE_OTHER): Payer: Medicare Other | Admitting: Adult Health

## 2019-04-15 ENCOUNTER — Other Ambulatory Visit: Payer: Self-pay

## 2019-04-15 DIAGNOSIS — K219 Gastro-esophageal reflux disease without esophagitis: Secondary | ICD-10-CM

## 2019-04-15 DIAGNOSIS — J309 Allergic rhinitis, unspecified: Secondary | ICD-10-CM | POA: Diagnosis not present

## 2019-04-15 DIAGNOSIS — J4541 Moderate persistent asthma with (acute) exacerbation: Secondary | ICD-10-CM

## 2019-04-15 MED ORDER — TIOTROPIUM BROMIDE MONOHYDRATE 2.5 MCG/ACT IN AERS: 2.0000 | INHALATION_SPRAY | Freq: Every day | RESPIRATORY_TRACT | 5 refills | Status: DC

## 2019-04-15 NOTE — Patient Instructions (Addendum)
Continue on Symbicort 2 puffs Twice daily  , rinse after use.  Begin Spiriva 2 puffs daily .  Add Zyrtec 10mg  At bedtime  .  Albuterol as needed for wheezing and shortness of breath Follow up with Dr. Lamonte Sakai  Or  Skylarr Liz NP in 4  weeks and As needed  Please contact office for sooner follow up if symptoms do not improve or worsen or seek emergency care

## 2019-04-15 NOTE — Progress Notes (Signed)
Virtual Visit via Telephone Note  I connected with [name] on 04/15/19 at 10:00 AM EDT by telephone and verified that I am speaking with the correct person using two identifiers.  Location: Patient: Home  Provider: Office    I discussed the limitations, risks, security and privacy concerns of performing an evaluation and management service by telephone and the availability of in person appointments. I also discussed with the patient that there may be a patient responsible charge related to this service. The patient expressed understanding and agreed to proceed.   History of Present Illness: 73 year old female former smoker followed for asthma, vocal cord dysfunction complicated by GERD and allergic rhinitis.  Medical history significant for esophageal stricture, thrombocytopenia Today's tele-visit is for a 2-week follow-up for asthma flare.  Patient was seen for a tele-visit 2 weeks ago diagnosed with a asthmatic bronchitic exacerbation.  She was given a Z-Pak and prednisone taper.  She was started on Singulair.  Patient says her cough and congestion are better.  But she continues to have ongoing symptoms with postnasal drainage drippy nose intermittent cough and wheezing.  She says symptoms have been worse for the last 2 to 3 months.  She denies any fever, discolored mucus, chest pain, orthopnea or edema.  She does request a handicap car sticker be renewed today..  Patient is followed by rheumatology was previously on methotrexate for possible rheumatoid arthritis.  Patient says she is no longer taking methotrexate.       Observations/Objective: PFT March 2018 showed FEV1 86%, ratio 61, FVC 110% significant positive bronchodilator response DLCO 90%  Chest x-ray March 2020 showed no acute process.  Clear lungs.  CBC Latest Ref Rng & Units 02/05/2019 12/07/2018 10/26/2018  WBC 4.0 - 10.5 K/uL 9.5 3.4(L) 4.0  Hemoglobin 12.0 - 15.0 g/dL 11.4(L) 9.7(L) 10.2(L)  Hematocrit 36.0 - 46.0  % 35.8(L) 32.2(L) 33.3(L)  Platelets 150 - 400 K/uL 390 375 304    Assessment and Plan: Asthma mild flare with allergic rhinitis We will add Zyrtec and Spiriva to see if this helps with her symptom burden.  If not consider a CT chest on the follow-up.  Consider repeat spirometry or PFTs once COVID-19 precautions are lifted  GERD-controlled on current regimen  Plan  Patient Instructions  Continue on Symbicort 2 puffs Twice daily  , rinse after use.  Begin Spiriva 2 puffs daily .  Add Zyrtec 10mg  At bedtime  .  Albuterol as needed for wheezing and shortness of breath Follow up with Dr. Lamonte Sakai  Or  Braden Deloach NP in 4  weeks and As needed  Please contact office for sooner follow up if symptoms do not improve or worsen or seek emergency care     Follow Up Instructions: Follow-up with Dr. Lamonte Sakai in 4 weeks and as needed Please contact office for sooner follow up if symptoms do not improve or worsen or seek emergency care     I discussed the assessment and treatment plan with the patient. The patient was provided an opportunity to ask questions and all were answered. The patient agreed with the plan and demonstrated an understanding of the instructions.   The patient was advised to call back or seek an in-person evaluation if the symptoms worsen or if the condition fails to improve as anticipated.  I provided 25  minutes of non-face-to-face time during this encounter.   Rexene Edison, NP

## 2019-04-19 ENCOUNTER — Telehealth: Payer: Self-pay | Admitting: Emergency Medicine

## 2019-04-19 NOTE — Telephone Encounter (Signed)
Called and spoke with pt who stated she had spoken with both TP and Jess 5/11 for a televisit.  Pt stated the Spiriva is going to cost her $77 and she stated she is unable to afford this and pt stated she was told by either TP or Jess if she was not going to be able to afford the medication to call back. Pt also stated that she is wanting to know if the form for handicap placard was placed in the mail for her as she said she has not received anything yet.  Jess, please advise on this for pt if you have any info for her. Thanks!

## 2019-04-22 NOTE — Telephone Encounter (Signed)
Called and spoke with patient regarding her Spiriva costs $77 per month Advised that we send her patient assistant forms to complete see if she qualifies Pt verbalized and expressed understanding regarding program; asked for forms to be mailed Advised the forms are printed and will be placed in mail today for pt to complete Will close this encounter, and open new one once forms have been rec'd back to office from pt Pt advised she understood Nothing further needed

## 2019-05-01 ENCOUNTER — Telehealth: Payer: Self-pay | Admitting: Adult Health

## 2019-05-01 NOTE — Telephone Encounter (Signed)
That is fine 

## 2019-05-01 NOTE — Telephone Encounter (Signed)
Spoke with patient, she is aware that this has been approved. She wishes to come to the office to pick up the application when it is ready. Advised her I would call her tomorrow once it has been completed since TP is not here this afternoon. She verbalized understanding.   Will place placard on TP's computer for signature.   Routing to myself for follow up.

## 2019-05-01 NOTE — Telephone Encounter (Signed)
Spoke with patient. She was calling to follow up on her handicap placard request. She had called in 04/19/19 requesting an update but never heard anything back. She is worried because her current placard expires on Sunday. She stated that TP had told her during her last televisit that she would sign off on her getting a permanent placard. Advised her that I would check to see if I could locate the form for her. She verbalized understanding.   From the 5/15 note, it appears that the Spiriva was addressed but not the handicap placard.   TP, please advise if you are ok with having a permanent placard?

## 2019-05-02 NOTE — Telephone Encounter (Signed)
Received the signed placard from [place]. Called and advised patient that the form had been signed. She wishes to come by the office to pick this up. Will place it up front.   Nothing further needed at time of call.

## 2019-05-06 DIAGNOSIS — Z1231 Encounter for screening mammogram for malignant neoplasm of breast: Secondary | ICD-10-CM | POA: Diagnosis not present

## 2019-05-13 ENCOUNTER — Ambulatory Visit (INDEPENDENT_AMBULATORY_CARE_PROVIDER_SITE_OTHER): Payer: Medicare Other

## 2019-05-13 ENCOUNTER — Other Ambulatory Visit: Payer: Self-pay

## 2019-05-13 ENCOUNTER — Telehealth: Payer: Self-pay | Admitting: Adult Health

## 2019-05-13 ENCOUNTER — Ambulatory Visit: Payer: Medicare Other | Admitting: Adult Health

## 2019-05-13 ENCOUNTER — Encounter: Payer: Self-pay | Admitting: General Surgery

## 2019-05-13 ENCOUNTER — Encounter: Payer: Self-pay | Admitting: Adult Health

## 2019-05-13 VITALS — BP 120/78 | HR 91 | Temp 98.0°F | Ht 62.0 in | Wt 185.8 lb

## 2019-05-13 DIAGNOSIS — J441 Chronic obstructive pulmonary disease with (acute) exacerbation: Secondary | ICD-10-CM

## 2019-05-13 DIAGNOSIS — J45909 Unspecified asthma, uncomplicated: Secondary | ICD-10-CM

## 2019-05-13 MED ORDER — IPRATROPIUM-ALBUTEROL 0.5-2.5 (3) MG/3ML IN SOLN: 3.0000 mL | Freq: Four times a day (QID) | RESPIRATORY_TRACT | 5 refills | Status: DC | PRN

## 2019-05-13 MED ORDER — IPRATROPIUM-ALBUTEROL 0.5-2.5 (3) MG/3ML IN SOLN: 3.0000 mL | Freq: Three times a day (TID) | RESPIRATORY_TRACT | 5 refills | Status: DC

## 2019-05-13 MED ORDER — PREDNISONE 10 MG PO TABS: ORAL_TABLET | ORAL | 0 refills | Status: DC

## 2019-05-13 NOTE — Progress Notes (Signed)
@Patient  ID: [name], female    DOB: 18-Jun-1946, 73 y.o.   MRN: [id number]  Chief Complaint  Patient presents with  . Follow-up    asthma     Referring provider: Merrilee Seashore, MD  HPI: 73 year old female former smoker followed for asthma, vocal cord dysfunction complicated by GERD and allergic rhinitis Medical history is significant for esophageal stricture, thrombocytopenia , questionable rheumatoid arthritis  TEST/EVENTS :  PFT March 2018 showed FEV1 86%, ratio 61, FVC 110% significant positive bronchodilator response DLCO 90%  05/13/2019 Follow up : Asthma  Patient presents for a one-month follow-up.  Patient says over the last week or 2 her breathing has not been as good.  She has had increased wheezing and shortness of breath.  She is had no increased cough.  She denies fever.  No recent travel or known sick contacts.  She feels like the hot humid weather has been increasing her breathing problems.  She was seen last visit and recommended to begin Spiriva and add Zyrtec.  She says she was unable to afford Spiriva. Appetite is good with no nausea vomiting or diarrhea.  She remains on Symbicort 2 puffs twice daily, Singulair daily and Zyrtec daily.  Allergies  Allergen Reactions  . Penicillins Itching and Rash    Immunization History  Administered Date(s) Administered  . Influenza Split 11/10/2011, 10/10/2012, 09/17/2018  . Influenza Whole 10/19/2006, 09/03/2008, 10/05/2009, 09/07/2010  . Influenza, High Dose Seasonal PF 09/05/2017  . Influenza,inj,Quad PF,6+ Mos 09/11/2013, 08/08/2014, 09/14/2015, 09/05/2016  . Pneumococcal Polysaccharide-23 03/01/2006  . Td 03/20/2009  . Zoster 10/19/2006    Past Medical History:  Diagnosis Date  . Allergic rhinitis   . Anemia   . Asthma   . Chronic bronchitis ([facility])   . Colonic polyp   . DDD (degenerative disc disease), lumbar 07/20/2011  . DJD (degenerative joint disease) of knee    bilateral  . DJD (degenerative  joint disease), lumbar 07/20/2011  . Esophageal stricture   . GERD (gastroesophageal reflux disease)   . Hiatal hernia   . Hyperlipidemia   . Osteoporosis   . Stroke [facility])    "didn't know I'd had one before I was told; didn't do any damage" (11/24/2016)  . Vocal cord dysfunction     Tobacco History: Social History   Tobacco Use  Smoking Status Former Smoker  . Packs/day: 0.10  . Years: 30.00  . Pack years: 3.00  . Types: Cigarettes  . Start date: 01/31/1975  . Last attempt to quit: 08/09/1995  . Years since quitting: 23.7  Smokeless Tobacco Never Used   Counseling given: Not Answered   Outpatient Medications Prior to Visit  Medication Sig Dispense Refill  . albuterol (PROVENTIL HFA) 108 (90 Base) MCG/ACT inhaler Inhale 2 puffs into the lungs every 4 (four) hours as needed for wheezing or shortness of breath. Reported on 12/30/2015 1 Inhaler 6  . albuterol (PROVENTIL) (2.5 MG/3ML) 0.083% nebulizer solution Take 2.5 mg by nebulization every 4 (four) hours as needed for shortness of breath. Reported on 12/30/2015    . aspirin EC 81 MG tablet Take 81 mg by mouth daily.    . budesonide-formoterol (SYMBICORT) 160-4.5 MCG/ACT inhaler Inhale 2 puffs into the lungs 2 (two) times daily for 28 days. 2 Inhaler 0  . cholecalciferol (VITAMIN D) 1000 UNITS tablet Take 1,000 Units by mouth daily.    . clonazePAM (KLONOPIN) 0.5 MG tablet TAKE 1 TABLET BY MOUTH TWICE DAILY AS NEEDED FOR ANXIETY  0  .  fluticasone (FLONASE) 50 MCG/ACT nasal spray Place 1 spray into the nose 2 (two) times daily.     . folic acid (FOLVITE) 1 MG tablet Take 1 tablet (1 mg total) by mouth daily. 90 tablet 3  . hydroxyurea (HYDREA) 500 MG capsule Take 1 capsule (500 mg total) by mouth 3 (three) times daily before meals. 90 capsule 6  . Iron-FA-B Cmp-C-Biot-Probiotic (FUSION PLUS) CAPS Take 1 tablet by mouth daily. 30 capsule 6  . Magnesium 200 MG TABS Take 1 tablet by mouth 2 (two) times daily.     . methotrexate  (RHEUMATREX) 2.5 MG tablet Take 2.5 mg by mouth 3 (three) times a week.   0  . montelukast (SINGULAIR) 10 MG tablet Take 1 tablet (10 mg total) by mouth at bedtime. 30 tablet 11  . omeprazole (PRILOSEC) 20 MG capsule TAKE 1 TABLET TWICE A DAY. 180 capsule 1  . pravastatin (PRAVACHOL) 40 MG tablet Take 40 mg by mouth daily.    Marland Kitchen rOPINIRole (REQUIP) 1 MG tablet Take 1.5 mg by mouth at bedtime as needed.     . sertraline (ZOLOFT) 50 MG tablet Take 50 mg by mouth daily.    . Tiotropium Bromide Monohydrate (SPIRIVA RESPIMAT) 2.5 MCG/ACT AERS Inhale 2 puffs into the lungs daily. (Patient not taking: Reported on 05/13/2019) 4 g 5   Facility-Administered Medications Prior to Visit  Medication Dose Route Frequency Provider Last Rate Last Dose  . 0.9 %  sodium chloride infusion  500 mL Intravenous Once Irene Shipper, MD         Review of Systems:   Constitutional:   No  weight loss, night sweats,  Fevers, chills, + fatigue, or  lassitude.  HEENT:   No headaches,  Difficulty swallowing,  Tooth/dental problems, or  Sore throat,                No sneezing, itching, ear ache,  +nasal congestion, post nasal drip,   CV:  No chest pain,  Orthopnea, PND, swelling in lower extremities, anasarca, dizziness, palpitations, syncope.   GI  No heartburn, indigestion, abdominal pain, nausea, vomiting, diarrhea, change in bowel habits, loss of appetite, bloody stools.   Resp:   No chest wall deformity  Skin: no rash or lesions.  GU: no dysuria, change in color of urine, no urgency or frequency.  No flank pain, no hematuria   MS:  No joint pain or swelling.  No decreased range of motion.  No back pain.    Physical Exam  BP 120/78 (BP Location: Left Arm, Patient Position: Sitting, Cuff Size: Normal)   Pulse 91   Temp 98 F (36.7 C)   Ht 5\' 2"  (1.575 m)   Wt 185 lb 12.8 oz (84.3 kg)   SpO2 100%   BMI 33.98 kg/m   GEN: A/Ox3; pleasant , NAD, obese    HEENT:  [place]/AT,  EACs-clear, TMs-wnl, NOSE-clear,  THROAT-clear, no lesions, no postnasal drip or exudate noted.   NECK:  Supple w/ fair ROM; no JVD; normal carotid impulses w/o bruits; no thyromegaly or nodules palpated; no lymphadenopathy.    RESP  Exp wheezing , speaks in full sentences no accessory muscle use, no dullness to percussion  CARD:  RRR, no m/r/g, no peripheral edema, pulses intact, no cyanosis or clubbing.  GI:   Soft & nt; nml bowel sounds; no organomegaly or masses detected.   Musco: Warm bil, no deformities or joint swelling noted.   Neuro: alert, no focal deficits noted.  Skin: Warm, no lesions or rashes    Lab Results:  CBC    BNP  Imaging: Dg Chest 2 View  Result Date: 05/13/2019 CLINICAL DATA:  COPD exacerbation EXAM: CHEST - 2 VIEW COMPARISON:  02/05/2019 FINDINGS: The heart size and mediastinal contours are within normal limits. Both lungs are clear. The visualized skeletal structures are unremarkable. Hiatal hernia is noted. IMPRESSION: Stable hiatal hernia.  No other focal abnormality is noted. Electronically Signed   By: Inez Catalina M.D.   On: 05/13/2019 11:22      PFT Results Latest Ref Rng & Units 02/03/2017  FVC-Pre L 1.96  FVC-Predicted Pre % 89  FVC-Post L 2.44  FVC-Predicted Post % 110  Pre FEV1/FVC % % 56  Post FEV1/FCV % % 61  FEV1-Pre L 1.11  FEV1-Predicted Pre % 65  FEV1-Post L 1.48  DLCO UNC% % 90  DLCO COR %Predicted % 114  TLC L 6.30  TLC % Predicted % 128  RV % Predicted % 199    No results found for: NITRICOXIDE      Assessment & Plan:   No problem-specific Assessment & Plan notes found for this encounter.     Rexene Edison, NP 05/13/2019

## 2019-05-13 NOTE — Assessment & Plan Note (Signed)
Acute exacerbation Check chest x-ray Add DuoNeb nebulizer 3 times daily.  (Unable to afford Spiriva) Continue trigger prevention  Plan  Patient Instructions  Prednisone taper over next week .  Continue on Symbicort 2 puffs Twice daily  , rinse after use.  Begin Duoneb Three times a day.  Continue on Zyrtec 10mg  At bedtime  .  Chest xray today .  Follow up with Dr. Lamonte Sakai  Or  Aryahna Spagna NP in 4-6   weeks and As needed  Please contact office for sooner follow up if symptoms do not improve or worsen or seek emergency care

## 2019-05-13 NOTE — Telephone Encounter (Signed)
Routing this info to [place] per TP as an [place].

## 2019-05-13 NOTE — Patient Instructions (Addendum)
Prednisone taper over next week .  Continue on Symbicort 2 puffs Twice daily  , rinse after use.  Begin Duoneb Three times a day.  Continue on Zyrtec 10mg  At bedtime  .  Chest xray today .  Follow up with Dr. Lamonte Sakai  Or  Haruna Rohlfs NP in 4-6   weeks and As needed  Please contact office for sooner follow up if symptoms do not improve or worsen or seek emergency care

## 2019-05-13 NOTE — Telephone Encounter (Signed)
I thought so , please review from [facility] and let DANA know please

## 2019-05-13 NOTE — Telephone Encounter (Signed)
Called and spoke with pt. Pt stated she was calling to let TP know that the medication TP was questioning if pt was still taking or not was the methotrexate. Per pt, pt is no longer taking this med as she stopped taking it about 3 months ago.  Routing to TP as an [place].

## 2019-05-15 NOTE — Telephone Encounter (Signed)
Updated medication list with notation the patient stopped taking methotrexate 3 months ago.  The patient stated directly to me during her OV that she was still taking the medication when the medication list was read off to her for review. Nothing further needed at this time.

## 2019-05-20 ENCOUNTER — Telehealth: Payer: Self-pay | Admitting: Hematology & Oncology

## 2019-05-20 ENCOUNTER — Inpatient Hospital Stay: Payer: Medicare Other | Attending: Hematology & Oncology

## 2019-05-20 ENCOUNTER — Encounter: Payer: Self-pay | Admitting: Hematology & Oncology

## 2019-05-20 ENCOUNTER — Other Ambulatory Visit: Payer: Self-pay

## 2019-05-20 ENCOUNTER — Inpatient Hospital Stay (HOSPITAL_BASED_OUTPATIENT_CLINIC_OR_DEPARTMENT_OTHER): Payer: Medicare Other | Admitting: Hematology & Oncology

## 2019-05-20 VITALS — BP 131/70 | HR 88 | Temp 98.1°F | Resp 19 | Wt 187.0 lb

## 2019-05-20 DIAGNOSIS — Z7951 Long term (current) use of inhaled steroids: Secondary | ICD-10-CM | POA: Insufficient documentation

## 2019-05-20 DIAGNOSIS — Z79899 Other long term (current) drug therapy: Secondary | ICD-10-CM | POA: Insufficient documentation

## 2019-05-20 DIAGNOSIS — D573 Sickle-cell trait: Secondary | ICD-10-CM | POA: Diagnosis not present

## 2019-05-20 DIAGNOSIS — D473 Essential (hemorrhagic) thrombocythemia: Secondary | ICD-10-CM | POA: Diagnosis not present

## 2019-05-20 DIAGNOSIS — D500 Iron deficiency anemia secondary to blood loss (chronic): Secondary | ICD-10-CM

## 2019-05-20 DIAGNOSIS — Z7982 Long term (current) use of aspirin: Secondary | ICD-10-CM

## 2019-05-20 DIAGNOSIS — J45909 Unspecified asthma, uncomplicated: Secondary | ICD-10-CM

## 2019-05-20 DIAGNOSIS — Z1589 Genetic susceptibility to other disease: Secondary | ICD-10-CM

## 2019-05-20 LAB — CBC WITH DIFFERENTIAL (CANCER CENTER ONLY)
Abs Immature Granulocytes: 0.15 10*3/uL — ABNORMAL HIGH (ref 0.00–0.07)
Basophils Absolute: 0.1 10*3/uL (ref 0.0–0.1)
Basophils Relative: 1 %
Eosinophils Absolute: 0.1 10*3/uL (ref 0.0–0.5)
Eosinophils Relative: 1 %
HCT: 40.2 % (ref 36.0–46.0)
Hemoglobin: 12.6 g/dL (ref 12.0–15.0)
Immature Granulocytes: 2 %
Lymphocytes Relative: 23 %
Lymphs Abs: 2.3 10*3/uL (ref 0.7–4.0)
MCH: 25.4 pg — ABNORMAL LOW (ref 26.0–34.0)
MCHC: 31.3 g/dL (ref 30.0–36.0)
MCV: 81 fL (ref 80.0–100.0)
Monocytes Absolute: 0.7 10*3/uL (ref 0.1–1.0)
Monocytes Relative: 7 %
Neutro Abs: 6.5 10*3/uL (ref 1.7–7.7)
Neutrophils Relative %: 66 %
Platelet Count: 557 10*3/uL — ABNORMAL HIGH (ref 150–400)
RBC: 4.96 MIL/uL (ref 3.87–5.11)
RDW: 15.9 % — ABNORMAL HIGH (ref 11.5–15.5)
WBC Count: 9.7 10*3/uL (ref 4.0–10.5)
nRBC: 0 % (ref 0.0–0.2)

## 2019-05-20 LAB — CMP (CANCER CENTER ONLY)
ALT: 13 U/L (ref 0–44)
AST: 13 U/L — ABNORMAL LOW (ref 15–41)
Albumin: 3.9 g/dL (ref 3.5–5.0)
Alkaline Phosphatase: 50 U/L (ref 38–126)
Anion gap: 7 (ref 5–15)
BUN: 13 mg/dL (ref 8–23)
CO2: 30 mmol/L (ref 22–32)
Calcium: 9.1 mg/dL (ref 8.9–10.3)
Chloride: 104 mmol/L (ref 98–111)
Creatinine: 0.87 mg/dL (ref 0.44–1.00)
GFR, Est AFR Am: >60 mL/min (ref >60)
GFR, Estimated: >60 mL/min (ref >60)
Glucose, Bld: 90 mg/dL (ref 70–99)
Potassium: 3.2 mmol/L — ABNORMAL LOW (ref 3.5–5.1)
Sodium: 141 mmol/L (ref 135–145)
Total Bilirubin: 0.5 mg/dL (ref 0.3–1.2)
Total Protein: 6.3 g/dL — ABNORMAL LOW (ref 6.5–8.1)

## 2019-05-20 LAB — LACTATE DEHYDROGENASE: LDH: 298 U/L — ABNORMAL HIGH (ref 98–192)

## 2019-05-20 LAB — SAVE SMEAR(SSMR), FOR PROVIDER SLIDE REVIEW

## 2019-05-20 NOTE — Telephone Encounter (Signed)
Appointments scheduled calendar printed per 6/15 los 

## 2019-05-20 NOTE — Progress Notes (Signed)
Hematology and Oncology Follow Up Visit  [name] [id number] Apr 26, 1946 73 y.o. 05/20/2019   Principle Diagnosis:  Essential thrombocythemia - JAK2 POSITIVE Sickle cell trait  Current Therapy:   Hydrea 500 mg PO TID - dose changed on 92/42/6834 Folic acid 1 mg by mouth daily Aspirin 81 mg daily    Interim History:  [name] is here today for follow-up.  She is having difficulties with her asthma.  She has had an asthma attack recently.  She saw her doctor last week.  He put her on some prednisone.  She does have nebulizers at home.  Her lungs still sound somewhat congested.  Her platelet count is up.  We will have to increase her Hydrea back up to 500 mg p.o. 3 times daily.  She is had no problems with the sickle cell trait.  She is had no urinary issues.  She is had no hematuria.  She has had no problems with bowels or bladder.  There is been no headache.  She had no pain in her hands or feet.  There is been no swelling in the hands or feet.   Overall, I would say her performance status is ECOG 1.  Medications:  Allergies as of 05/20/2019      Reactions   Penicillins Itching, Rash      Medication List       Accurate as of May 20, 2019  1:17 PM. If you have any questions, ask your nurse or doctor.        albuterol (2.5 MG/3ML) 0.083% nebulizer solution Commonly known as: PROVENTIL Take 2.5 mg by nebulization every 4 (four) hours as needed for shortness of breath. Reported on 12/30/2015   albuterol 108 (90 Base) MCG/ACT inhaler Commonly known as: Proventil HFA Inhale 2 puffs into the lungs every 4 (four) hours as needed for wheezing or shortness of breath. Reported on 12/30/2015   aspirin EC 81 MG tablet Take 81 mg by mouth daily.   budesonide-formoterol 160-4.5 MCG/ACT inhaler Commonly known as: Symbicort Inhale 2 puffs into the lungs 2 (two) times daily for 28 days.   cholecalciferol 1000 units tablet Commonly known as: VITAMIN D Take 1,000 Units by  mouth daily.   clonazePAM 0.5 MG tablet Commonly known as: KLONOPIN TAKE 1 TABLET BY MOUTH TWICE DAILY AS NEEDED FOR ANXIETY   fluticasone 50 MCG/ACT nasal spray Commonly known as: FLONASE Place 1 spray into the nose 2 (two) times daily.   folic acid 1 MG tablet Commonly known as: FOLVITE Take 1 tablet (1 mg total) by mouth daily.   Fusion Plus Caps Take 1 tablet by mouth daily.   hydroxyurea 500 MG capsule Commonly known as: HYDREA Take 1 capsule (500 mg total) by mouth 3 (three) times daily before meals.   ipratropium-albuterol 0.5-2.5 (3) MG/3ML Soln Commonly known as: DUONEB Take 3 mLs by nebulization 3 (three) times daily.   Magnesium 200 MG Tabs Take 1 tablet by mouth 2 (two) times daily.   montelukast 10 MG tablet Commonly known as: SINGULAIR Take 1 tablet (10 mg total) by mouth at bedtime.   omeprazole 20 MG capsule Commonly known as: PRILOSEC TAKE 1 TABLET TWICE A DAY.   pravastatin 40 MG tablet Commonly known as: PRAVACHOL Take 40 mg by mouth daily.   predniSONE 10 MG tablet Commonly known as: DELTASONE 4 tabs for 2 days, then 3 tabs for 2 days, 2 tabs for 2 days, then 1 tab for 2 days, then stop  rOPINIRole 1 MG tablet Commonly known as: REQUIP Take 1.5 mg by mouth at bedtime as needed.   sertraline 50 MG tablet Commonly known as: ZOLOFT Take 50 mg by mouth daily.       Allergies:  Allergies  Allergen Reactions  . Penicillins Itching and Rash    Past Medical History, Surgical history, Social history, and Family History were reviewed and updated.  Review of Systems: All other 10 point review of systems is negative.   Physical Exam:  weight is 187 lb (84.8 kg). Her oral temperature is 98.1 F (36.7 C). Her blood pressure is 131/70 and her pulse is 88. Her respiration is 19 and oxygen saturation is 100%.   Wt Readings from Last 3 Encounters:  05/20/19 187 lb (84.8 kg)  05/13/19 185 lb 12.8 oz (84.3 kg)  02/05/19 176 lb 6.4 oz (80 kg)     Ocular: Sclerae unicteric, pupils equal, round and reactive to light Ear-nose-throat: Oropharynx clear, dentition fair Lymphatic: No cervical, supraclavicular or axillary adenopathy Lungs no rales or rhonchi, good excursion bilaterally Heart regular rate and rhythm, no murmur appreciated Abd soft, nontender, positive bowel sounds, no liver or spleen tip palpated on exam, no fluid wave  MSK no focal spinal tenderness, no joint edema Neuro: non-focal, well-oriented, appropriate affect Breasts: Deferred   Lab Results  Component Value Date   WBC 9.7 05/20/2019   HGB 12.6 05/20/2019   HCT 40.2 05/20/2019   MCV 81.0 05/20/2019   PLT 557 (H) 05/20/2019   Lab Results  Component Value Date   FERRITIN 67 02/05/2019   IRON 61 02/05/2019   TIBC 245 02/05/2019   UIBC 184 02/05/2019   IRONPCTSAT 25 02/05/2019   Lab Results  Component Value Date   RETICCTPCT 1.9 12/07/2018   RBC 4.96 05/20/2019   RETICCTABS 73.1 08/06/2015   No results found for: KPAFRELGTCHN, LAMBDASER, KAPLAMBRATIO No results found for: IGGSERUM, IGA, IGMSERUM No results found for: [name], SPEI   Chemistry      Component Value Date/Time   NA 141 05/20/2019 1130   NA 145 11/24/2017 0754   NA 142 05/25/2017 0755   K 3.2 (L) 05/20/2019 1130   K 3.7 11/24/2017 0754   K 3.9 05/25/2017 0755   CL 104 05/20/2019 1130   CL 104 11/24/2017 0754   CO2 30 05/20/2019 1130   CO2 27 11/24/2017 0754   CO2 24 05/25/2017 0755   BUN 13 05/20/2019 1130   BUN 12 11/24/2017 0754   BUN 13.3 05/25/2017 0755   CREATININE 0.87 05/20/2019 1130   CREATININE 1.0 11/24/2017 0754   CREATININE 0.8 05/25/2017 0755      Component Value Date/Time   CALCIUM 9.1 05/20/2019 1130   CALCIUM 9.1 11/24/2017 0754   CALCIUM 9.5 05/25/2017 0755   ALKPHOS 50 05/20/2019 1130   ALKPHOS 70 11/24/2017 0754   ALKPHOS 74 05/25/2017 0755   AST 13 (L) 05/20/2019 1130   AST 21 05/25/2017  0755   ALT 13 05/20/2019 1130   ALT 18 11/24/2017 0754   ALT 14 05/25/2017 0755   BILITOT 0.5 05/20/2019 1130   BILITOT 0.37 05/25/2017 0755       Impression and Plan: [name] is a very pleasant 73 yo African American female with essential thrombocythemia, that is JAK2 positive.  In addition, she has the sickle cell trait.  Hopefully, this asthma flareup will get better.  I would hate to see her have to be in  the hospital for this.  She really does not want to go to the hospital.  Again, we are increasing her Hydrea to 500 mg p.o. 3 times daily.    I will plan to see her back in another 4 weeks.  [name], MD 6/15/20201:17 PM

## 2019-05-21 LAB — IRON AND TIBC
Iron: 74 ug/dL (ref 41–142)
Saturation Ratios: 28 % (ref 21–57)
TIBC: 263 ug/dL (ref 236–444)
UIBC: 189 ug/dL (ref 120–384)

## 2019-05-21 LAB — FERRITIN: Ferritin: 49 ng/mL (ref 11–307)

## 2019-06-03 ENCOUNTER — Emergency Department (HOSPITAL_COMMUNITY): Payer: Medicare Other

## 2019-06-03 ENCOUNTER — Encounter (HOSPITAL_COMMUNITY): Payer: Self-pay | Admitting: Emergency Medicine

## 2019-06-03 ENCOUNTER — Other Ambulatory Visit: Payer: Self-pay

## 2019-06-03 ENCOUNTER — Emergency Department (HOSPITAL_COMMUNITY)
Admission: EM | Admit: 2019-06-03 | Discharge: 2019-06-03 | Disposition: A | Discharge status: Home / Self Care | Payer: Medicare Other | Attending: Emergency Medicine | Admitting: Emergency Medicine

## 2019-06-03 DIAGNOSIS — R0602 Shortness of breath: Secondary | ICD-10-CM | POA: Diagnosis not present

## 2019-06-03 DIAGNOSIS — Z79899 Other long term (current) drug therapy: Secondary | ICD-10-CM | POA: Diagnosis not present

## 2019-06-03 DIAGNOSIS — R0689 Other abnormalities of breathing: Secondary | ICD-10-CM | POA: Diagnosis not present

## 2019-06-03 DIAGNOSIS — R531 Weakness: Secondary | ICD-10-CM | POA: Diagnosis not present

## 2019-06-03 DIAGNOSIS — Z7982 Long term (current) use of aspirin: Secondary | ICD-10-CM | POA: Insufficient documentation

## 2019-06-03 DIAGNOSIS — Z87891 Personal history of nicotine dependence: Secondary | ICD-10-CM | POA: Insufficient documentation

## 2019-06-03 DIAGNOSIS — R079 Chest pain, unspecified: Secondary | ICD-10-CM | POA: Diagnosis not present

## 2019-06-03 DIAGNOSIS — E119 Type 2 diabetes mellitus without complications: Secondary | ICD-10-CM | POA: Insufficient documentation

## 2019-06-03 DIAGNOSIS — J45901 Unspecified asthma with (acute) exacerbation: Secondary | ICD-10-CM | POA: Diagnosis not present

## 2019-06-03 DIAGNOSIS — R9431 Abnormal electrocardiogram [ECG] [EKG]: Secondary | ICD-10-CM | POA: Diagnosis not present

## 2019-06-03 LAB — BASIC METABOLIC PANEL
Anion gap: 9 (ref 5–15)
BUN: 13 mg/dL (ref 8–23)
CO2: 22 mmol/L (ref 22–32)
Calcium: 9.1 mg/dL (ref 8.9–10.3)
Chloride: 106 mmol/L (ref 98–111)
Creatinine, Ser: 0.91 mg/dL (ref 0.44–1.00)
GFR calc Af Amer: >60 mL/min (ref >60)
GFR calc non Af Amer: >60 mL/min (ref >60)
Glucose, Bld: 102 mg/dL — ABNORMAL HIGH (ref 70–99)
Potassium: 4 mmol/L (ref 3.5–5.1)
Sodium: 137 mmol/L (ref 135–145)

## 2019-06-03 LAB — CBC WITH DIFFERENTIAL/PLATELET
Abs Immature Granulocytes: 0.07 10*3/uL (ref 0.00–0.07)
Basophils Absolute: 0.1 10*3/uL (ref 0.0–0.1)
Basophils Relative: 1 %
Eosinophils Absolute: 0.1 10*3/uL (ref 0.0–0.5)
Eosinophils Relative: 2 %
HCT: 38.4 % (ref 36.0–46.0)
Hemoglobin: 11.9 g/dL — ABNORMAL LOW (ref 12.0–15.0)
Immature Granulocytes: 1 %
Lymphocytes Relative: 28 %
Lymphs Abs: 1.9 10*3/uL (ref 0.7–4.0)
MCH: 25.3 pg — ABNORMAL LOW (ref 26.0–34.0)
MCHC: 31 g/dL (ref 30.0–36.0)
MCV: 81.5 fL (ref 80.0–100.0)
Monocytes Absolute: 0.5 10*3/uL (ref 0.1–1.0)
Monocytes Relative: 8 %
Neutro Abs: 4.2 10*3/uL (ref 1.7–7.7)
Neutrophils Relative %: 60 %
Platelets: 413 10*3/uL — ABNORMAL HIGH (ref 150–400)
RBC: 4.71 MIL/uL (ref 3.87–5.11)
RDW: 16.1 % — ABNORMAL HIGH (ref 11.5–15.5)
WBC: 6.9 10*3/uL (ref 4.0–10.5)
nRBC: 0 % (ref 0.0–0.2)

## 2019-06-03 LAB — TROPONIN I (HIGH SENSITIVITY)
Troponin I (High Sensitivity): 3 ng/L (ref <18)
Troponin I (High Sensitivity): <2 ng/L (ref <18)

## 2019-06-03 LAB — D-DIMER, QUANTITATIVE: D-Dimer, Quant: 0.5 ug/mL-FEU (ref 0.00–0.50)

## 2019-06-03 MED ORDER — PREDNISONE 20 MG PO TABS: 60.0000 mg | ORAL_TABLET | Freq: Every day | ORAL | 0 refills | Status: AC

## 2019-06-03 MED ORDER — PREDNISONE 20 MG PO TABS
60.0000 mg | ORAL_TABLET | Freq: Once | ORAL | Status: AC
  Administered 2019-06-03: 60 mg via ORAL
  Filled 2019-06-03: qty 3

## 2019-06-03 MED ORDER — ALBUTEROL SULFATE HFA 108 (90 BASE) MCG/ACT IN AERS
6.0000 | INHALATION_SPRAY | Freq: Once | RESPIRATORY_TRACT | Status: AC
  Administered 2019-06-03: 6 via RESPIRATORY_TRACT
  Filled 2019-06-03: qty 6.7

## 2019-06-03 NOTE — ED Notes (Signed)
Silvestre Moment (daughter) [phone]

## 2019-06-03 NOTE — ED Notes (Signed)
Patient verbalizes understanding of discharge instructions. Opportunity for questioning and answers were provided. Armband removed by staff, pt discharged from ED.  

## 2019-06-03 NOTE — ED Triage Notes (Signed)
Pt BIB [facility] from Dr office. Pt states she had had intermittent chest pain for 3 weeks. Dr. Gabriel Carina noted EKG changes today during visit. Pt given 324 aspirin via EMS.

## 2019-06-03 NOTE — ED Provider Notes (Signed)
[facility] EMERGENCY DEPARTMENT Provider Note   CSN: [id number] Arrival date & time: 06/03/19  1559    History   Chief Complaint Chief Complaint  Patient presents with  . Chest Pain    HPI [name] is a 73 y.o. female.     The history is provided by the patient.  Chest Pain Pain location:  L chest Pain quality: aching   Pain radiates to:  Does not radiate Pain severity:  Mild Onset quality:  Gradual Timing:  Constant Progression:  Waxing and waning Chronicity:  New Context: movement and raising an arm   Relieved by:  Nothing Worsened by:  Nothing Associated symptoms: no abdominal pain, no back pain, no cough, no fever, no palpitations, no shortness of breath and no vomiting   Risk factors: high cholesterol   Risk factors: no coronary artery disease, no diabetes mellitus and no hypertension     Past Medical History:  Diagnosis Date  . Allergic rhinitis   . Anemia   . Asthma   . Chronic bronchitis ([place])   . Colonic polyp   . DDD (degenerative disc disease), lumbar 07/20/2011  . DJD (degenerative joint disease) of knee    bilateral  . DJD (degenerative joint disease), lumbar 07/20/2011  . Esophageal stricture   . GERD (gastroesophageal reflux disease)   . Hiatal hernia   . Hyperlipidemia   . Osteoporosis   . Stroke [facility])    "didn't know I'd had one before I was told; didn't do any damage" (11/24/2016)  . Vocal cord dysfunction     Patient Active Problem List   Diagnosis Date Noted  . Bronchitis with asthma, acute 04/01/2019  . On methotrexate therapy 07/10/2018  . Abnormal laboratory test 03/29/2018  . Sickle cell trait ([place]) 01/20/2017  . JAK-2 gene mutation 12/06/2016  . AKI (acute kidney injury) ([place]) 11/24/2016  . Right shoulder pain 11/24/2016  . Acute respiratory distress 11/24/2016  . RLS (restless legs syndrome) 11/24/2016  . Restless leg syndrome 10/13/2016  . Arthritis of knee, degenerative 02/18/2015  . Shingles  12/19/2014  . Lung collapse   . Atelectasis   . Essential thrombocythemia ([place]) 12/24/2013  . History of arthroscopy of knee 11/11/2013  . TIA (transient ischemic attack) 09/01/2012  . Chest pain 09/01/2012  . Arm numbness left 06/03/2012  . Migraine 05/31/2012  . Dyspnea on exertion 08/09/2011  . DDD (degenerative disc disease), lumbar 07/20/2011  . DJD (degenerative joint disease), lumbar 07/20/2011  . CVA (cerebral infarction) 07/20/2011  . Acute lumbar radiculopathy 07/12/2011  . Preventative health care 07/10/2011  . TRIGGER FINGER, RIGHT MIDDLE 01/11/2011  . Other dysphagia 01/11/2011  . MENOPAUSAL DISORDER 07/05/2010  . OSTEOARTHRITIS, KNEES, BILATERAL 07/05/2010  . NEVUS 05/20/2010  . VAGINITIS 03/23/2010  . SUPERFICIAL THROMBOPHLEBITIS 03/20/2009  . FATIGUE 03/20/2009  . DYSPHAGIA UNSPECIFIED 02/16/2009  . Snoring 11/05/2008  . HERPES ZOSTER 06/30/2008  . Diabetes ([place]) 06/30/2008  . Hyperlipidemia 06/30/2008  . ESOPHAGEAL STRICTURE 06/30/2008  . FOOT PAIN, RIGHT 06/30/2008  . OSTEOPOROSIS 06/30/2008  . COLONIC POLYPS, HX OF 06/30/2008  . PANIC ATTACK 09/05/2007  . Allergic rhinitis 09/05/2007  . Vocal cord dysfunction 09/05/2007  . Intrinsic asthma 09/05/2007  . Esophageal reflux 09/05/2007    Past Surgical History:  Procedure Laterality Date  . CARPAL TUNNEL RELEASE Bilateral   . COLONOSCOPY W/ BIOPSIES AND POLYPECTOMY    . ESOPHAGOGASTRODUODENOSCOPY (EGD) WITH ESOPHAGEAL DILATION    . FOOT SURGERY Bilateral    "might have  been bunions or hammertoes"  . KNEE ARTHROSCOPY Left 11/06/2013  . TOTAL ABDOMINAL HYSTERECTOMY    . VIDEO BRONCHOSCOPY Bilateral 12/12/2014   Procedure: VIDEO BRONCHOSCOPY WITHOUT FLUORO;  Surgeon: Chesley Mires, MD;  Location: [facility] ENDOSCOPY;  Service: Cardiopulmonary;  Laterality: Bilateral;     OB History   No obstetric history on file.      Home Medications    Prior to Admission medications   Medication Sig Start Date End Date  Taking? Authorizing Provider  albuterol (PROVENTIL HFA) 108 (90 Base) MCG/ACT inhaler Inhale 2 puffs into the lungs every 4 (four) hours as needed for wheezing or shortness of breath. Reported on 12/30/2015 03/04/16   Collene Gobble, MD  albuterol (PROVENTIL) (2.5 MG/3ML) 0.083% nebulizer solution Take 2.5 mg by nebulization every 4 (four) hours as needed for shortness of breath. Reported on 12/30/2015 09/11/13   Collene Gobble, MD  aspirin EC 81 MG tablet Take 81 mg by mouth daily.    [provider]  budesonide-formoterol (SYMBICORT) 160-4.5 MCG/ACT inhaler Inhale 2 puffs into the lungs 2 (two) times daily for 28 days. 03/16/18 07/11/19  Collene Gobble, MD  cholecalciferol (VITAMIN D) 1000 UNITS tablet Take 1,000 Units by mouth daily.    [provider]  clonazePAM (KLONOPIN) 0.5 MG tablet TAKE 1 TABLET BY MOUTH TWICE DAILY AS NEEDED FOR ANXIETY 04/22/15   [provider]  fluticasone (FLONASE) 50 MCG/ACT nasal spray Place 1 spray into the nose 2 (two) times daily.     [provider]  folic acid (FOLVITE) 1 MG tablet Take 1 tablet (1 mg total) by mouth daily. 06/03/15   [place], Holli Humbles, NP  hydroxyurea (HYDREA) 500 MG capsule Take 1 capsule (500 mg total) by mouth 3 (three) times daily before meals. 03/02/18   [name], MD  ipratropium-albuterol (DUONEB) 0.5-2.5 (3) MG/3ML SOLN Take 3 mLs by nebulization 3 (three) times daily. 05/13/19   [place], Fonnie Mu, NP  Iron-FA-B Cmp-C-Biot-Probiotic (FUSION PLUS) CAPS Take 1 tablet by mouth daily. 10/26/18   [name], MD  Magnesium 200 MG TABS Take 1 tablet by mouth 2 (two) times daily.     [provider]  montelukast (SINGULAIR) 10 MG tablet Take 1 tablet (10 mg total) by mouth at bedtime. 04/01/19   Fenton Foy, NP  omeprazole (PRILOSEC) 20 MG capsule TAKE 1 TABLET TWICE A DAY. 09/08/17   Collene Gobble, MD  pravastatin (PRAVACHOL) 40 MG tablet Take 40 mg by mouth daily.    [provider]  predniSONE (DELTASONE) 20 MG tablet Take 3 tablets (60 mg total) by mouth daily for 4 doses. 06/03/19 06/07/19  Derell Bruun, DO  rOPINIRole (REQUIP) 1 MG tablet Take 1.5 mg by mouth at bedtime as needed.     [provider]  sertraline (ZOLOFT) 50 MG tablet Take 50 mg by mouth daily.    [provider]    Family History Family History  Problem Relation Age of Onset  . Heart attack Mother   . Colon cancer Father 100  . Cancer Sister        unknown type  . Asthma Brother   . Asthma Other        uncle  . Stomach cancer Neg Hx   . Esophageal cancer Neg Hx   . Rectal cancer Neg Hx     Social History Social History   Tobacco Use  . Smoking status: Former Smoker    Packs/day: 0.10  Years: 30.00    Pack years: 3.00    Types: Cigarettes    Start date: 01/31/1975    Quit date: 08/09/1995    Years since quitting: 23.8  . Smokeless tobacco: Never Used  Substance Use Topics  . Alcohol use: No    Alcohol/week: 0.0 standard drinks  . Drug use: No     Allergies   Penicillins   Review of Systems Review of Systems  Constitutional: Negative for chills and fever.  HENT: Negative for ear pain and sore throat.   Eyes: Negative for pain and visual disturbance.  Respiratory: Negative for cough and shortness of breath.   Cardiovascular: Positive for chest pain. Negative for palpitations.  Gastrointestinal: Negative for abdominal pain and vomiting.  Genitourinary: Negative for dysuria and hematuria.  Musculoskeletal: Negative for arthralgias and back pain.  Skin: Negative for color change and rash.  Neurological: Negative for seizures and syncope.  All other systems reviewed and are negative.    Physical Exam Updated Vital Signs  ED Triage Vitals  Enc Vitals Group     BP 06/03/19 1604 126/65     Pulse Rate 06/03/19 1604 82     Resp 06/03/19 1604 18     Temp 06/03/19 1604 98 F (36.7 C)     Temp Source 06/03/19 1604 Oral     SpO2 06/03/19 1604 100 %      Weight 06/03/19 1605 187 lb (84.8 kg)     Height 06/03/19 1605 5\' 2"  (1.575 m)     Head Circumference --      Peak Flow --      Pain Score 06/03/19 1605 6     Pain Loc --      Pain Edu? --      Excl. in [place]? --     Physical Exam Vitals signs and nursing note reviewed.  Constitutional:      General: She is not in acute distress.    Appearance: She is well-developed. She is not ill-appearing.  HENT:     Head: Normocephalic and atraumatic.  Eyes:     Extraocular Movements: Extraocular movements intact.     Conjunctiva/sclera: Conjunctivae normal.     Pupils: Pupils are equal, round, and reactive to light.  Neck:     Musculoskeletal: Normal range of motion and neck supple.  Cardiovascular:     Rate and Rhythm: Normal rate and regular rhythm.     Pulses:          Radial pulses are 2+ on the right side and 2+ on the left side.     Heart sounds: Normal heart sounds. No murmur.  Pulmonary:     Effort: Pulmonary effort is normal. No respiratory distress.     Breath sounds: Wheezing (mild) present. No decreased breath sounds, rhonchi or rales.  Chest:     Chest wall: Tenderness present.  Abdominal:     Palpations: Abdomen is soft.     Tenderness: There is no abdominal tenderness.  Musculoskeletal:     Right lower leg: No edema.     Left lower leg: No edema.  Skin:    General: Skin is warm and dry.  Neurological:     General: No focal deficit present.     Mental Status: She is alert.  Psychiatric:        Mood and Affect: Mood normal.      ED Treatments / Results  Labs (all labs ordered are listed, but only abnormal results are displayed) Labs  Reviewed  CBC WITH DIFFERENTIAL/PLATELET - Abnormal; Notable for the following components:      Result Value   Hemoglobin 11.9 (*)    MCH 25.3 (*)    RDW 16.1 (*)    Platelets 413 (*)    All other components within normal limits  BASIC METABOLIC PANEL - Abnormal; Notable for the following components:   Glucose, Bld 102 (*)     All other components within normal limits  TROPONIN I (HIGH SENSITIVITY)  D-DIMER, QUANTITATIVE (NOT AT [facility])  TROPONIN I (HIGH SENSITIVITY)    EKG EKG Interpretation  Date/Time:  Monday June 03 2019 16:06:20 EDT Ventricular Rate:  84 PR Interval:    QRS Duration: 80 QT Interval:  391 QTC Calculation: 463 R Axis:   16 Text Interpretation:  Sinus rhythm Confirmed by Lennice Sites [phone]) on 06/03/2019 5:41:06 PM   Radiology Dg Chest Portable 1 View  Result Date: 06/03/2019 CLINICAL DATA:  Chest pain EXAM: PORTABLE CHEST 1 VIEW COMPARISON:  05/13/2019 FINDINGS: The heart size and mediastinal contours are within normal limits. Both lungs are clear. The visualized skeletal structures are unremarkable. IMPRESSION: No active disease. Electronically Signed   By: Franchot Gallo M.D.   On: 06/03/2019 19:24    Procedures Procedures (including critical care time)  Medications Ordered in ED Medications  albuterol (VENTOLIN HFA) 108 (90 Base) MCG/ACT inhaler 6 puff (6 puffs Inhalation Given 06/03/19 2047)  predniSONE (DELTASONE) tablet 60 mg (60 mg Oral Given 06/03/19 2046)     Initial Impression / Assessment and Plan / ED Course  I have reviewed the triage vital signs and the nursing notes.  Pertinent labs & imaging results that were available during my care of the patient were reviewed by me and considered in my medical decision making (see chart for details).     [name] is a 73 year old female with history of asthma, high cholesterol who presents to the ED with left-sided chest pain.  Appears reproducible on exam.  Has some wheezing on exam.  Possibly MSK versus mild asthma exacerbation.  D-dimer negative and doubt PE.  Troponin negative x2 and doubt ACS.  Heart score is 3.  EKG with no ischemic changes.  F/u up outpatient wit PCP. X-ray with no signs of pneumonia, pneumothorax, pleural effusion.  Lab work with no significant anemia, electrolyte abnormality, kidney injury.   Patient given albuterol.  Will prescribe prednisone.  Suspect MSK versus mild asthma exacerbation discharged from ED in good condition.  Recommend follow-up primary care doctor.  This chart was dictated using voice recognition software.  Despite best efforts to proofread,  errors can occur which can change the documentation meaning.    Final Clinical Impressions(s) / ED Diagnoses   Final diagnoses:  Mild asthma with exacerbation, unspecified whether persistent    ED Discharge Orders         Ordered    predniSONE (DELTASONE) 20 MG tablet  Daily     06/03/19 2238           Lennice Sites, DO 06/03/19 2239

## 2019-06-04 DIAGNOSIS — H5202 Hypermetropia, left eye: Secondary | ICD-10-CM | POA: Diagnosis not present

## 2019-06-04 DIAGNOSIS — H43813 Vitreous degeneration, bilateral: Secondary | ICD-10-CM | POA: Diagnosis not present

## 2019-06-04 DIAGNOSIS — H524 Presbyopia: Secondary | ICD-10-CM | POA: Diagnosis not present

## 2019-06-04 DIAGNOSIS — H2513 Age-related nuclear cataract, bilateral: Secondary | ICD-10-CM | POA: Diagnosis not present

## 2019-06-04 DIAGNOSIS — H5211 Myopia, right eye: Secondary | ICD-10-CM | POA: Diagnosis not present

## 2019-06-06 ENCOUNTER — Other Ambulatory Visit: Payer: Self-pay | Admitting: *Deleted

## 2019-06-06 NOTE — Patient Outreach (Signed)
[facility]) Care Management  06/06/2019  [name] Dec 12, 1945 [id number]   Telephone Screen  Referral Date: 06/06/19 Referral Source: [facility] referral  Referral Reason: [facility] member phone [phone] would like to see if she qualifies for medication co pay assistance for symbicort, duoneb, and proventil  Insurance: united [employer] care medicare    Outreach attempt # 1 No answer. [facility] RN CM left HIPAA compliant voicemail message along with CM's contact info.   Plan: [facility] RN CM sent an unsuccessful outreach letter and scheduled this patient for another call attempt within 4 business days  Allie Gerhold L. [name], RN, BSN, [place] Coordinator Office number [phone] Mobile number [phone]  Main [facility] number [phone] Fax number [phone]

## 2019-06-10 ENCOUNTER — Other Ambulatory Visit: Payer: Self-pay | Admitting: *Deleted

## 2019-06-10 ENCOUNTER — Encounter: Payer: Self-pay | Admitting: *Deleted

## 2019-06-10 ENCOUNTER — Other Ambulatory Visit: Payer: Self-pay

## 2019-06-10 NOTE — Patient Outreach (Signed)
[facility]) Care Management  06/10/2019  [name] [birth year] [id number]   Telephone Screen  Referral Date: 06/06/19 Referral Source: [facility] referral  Referral Reason: [facility] member phone [phone] would like to see if she qualifies for medication co pay assistance for symbicort, duoneb, and proventil -A Bell[place] 06/05/19 Insurance: united health care medicare  ED visit on 06/03/19 ED Visit x 1 in the last 6 months for mild asthma   Outreach attempt # 2 Patient returned a call to [facility] RN CM Patient is able to verify HIPAA Reviewed and addressed referral to [facility] with patient  Medications:  She confirms Medication assistance need for her inhalers  She reports "they are harder to get" because she just does not have money to get the inhalers She states she has to pay $40  Out of pocket and is on a fixed social security budget. She reports she has in the past received samples prn from the MD office but this is ongoing concern. Dr Lamonte Sakai and Lynelle Smoke Parrett prescribes and helps manages her asthma medicines. Tammy Parrett was seen last on 05/13/19 and Dr Lamonte Sakai to be seen on 06/17/19   She reports her proventil is the newest medicine ordered but she has been on her symbicort for awhile  She gets most of her medications mailed to her by the united healthcare mail order process  She reports receiving assistance from [facility] program only years ago. She reports the last time they informed her she did not qualify   Social:  [name] is retired, widowed and lives alone. She has support of her daughters, family, pastor and church family. She takes care of 2 of her grandchildren during the day. She reports she drives herself to medical appointments but have her daughters available to assist prn. She is independent with her care needs She reports some moments of sadness since the loss of her husband but relies on her faith and support from her church members . Her Pastor frequently checks on  her. [facility] RN CM reviewed [facility] SW resources for counseling resources but at this time denies need of [facility] SW referral for this at this time. She was encouraged to call [facility] RN CM for this prn   Conditions: superficial thrombophlebitis, migraine, TIA, allergic rhinitis, intrinsic asthma, hx of lung collapse, bronchitis, esophageal reflux, DM, CVA, hx of acute lumbar radiculopathy, bilateral knee osteoarthritis., hx of shingles, restless leg syndrome, sickle cell trait, right shoulder pain, hx of colonic polyps, snoring , right foot pain   DME:  Nebulizer, cbg monitor   Appointments:  06/17/19 1000 Dr Baltazar Apo pulmonologist  06/20/19 lab and then vist with Dr Marin Olp  Advance Directives: Denies need for assist with advance directives   Consent: [facility] RN CM reviewed [facility] services with patient. Patient gave verbal consent for services for [facility] telephonic RN CM, and [facility] pharmacy .   Plan: [facility] RN CM will refer [name] to [place] for possible medication assistance to see if she qualifies for medication co pay assistance for symbicort, duoneb, and proventil per [facility] referral from [place] 06/05/19 [name] discussed that she only had one mask. She and [facility] RN CM discussed availability of free masks listed from www.[place].gov. [facility] RN CM sent this list of resources for free masks to her in the mail and listed [employer] and [employer] as resources to purchase boxes of masks [facility] RN CM consulted with [facility] SW, Museum/gallery conservator and [place] coach, F  P[place] [facility] RN CM had [facility] CMA, Jerri to assist with sending her a couple of masks in the mail  [facility] RN CM will case the telephonic case at this time   Pt encouraged to return a call to [facility] RN CM prn  Routed note to MDs   Joelene Millin L. [name], RN, BSN, [place] Coordinator Office number [phone] Mobile number [phone]  Main [facility] number [phone] Fax number [phone]

## 2019-06-11 ENCOUNTER — Telehealth: Payer: Self-pay | Admitting: Pharmacist

## 2019-06-11 ENCOUNTER — Other Ambulatory Visit: Payer: Self-pay | Admitting: Pharmacy Technician

## 2019-06-11 NOTE — Patient Outreach (Signed)
[facility]) Care Management  [place]   06/11/2019  [name] June 09, 1946 [id number]  Reason for referral: Medication Assistance  Referral source: [facility] Referral medication(s): Inhaler Current insurance: United Health Care  PMHx:  Allergic rhinitis, arthritis of knee, history of CVA, DDD (lumbar),Sickle Cell trait positive, GERD, hyperlipidemia and RLS.  HPI: Patient expressed concern paying for her inhalers. She said her provider had been giving her samples. Spoke with patient over the phone. HIPAA identifiers were obtained.     Objective: Allergies  Allergen Reactions  . Penicillins Itching and Rash    Medications Reviewed Today    Reviewed by Elayne Guerin, [facility] (Pharmacist) on 06/11/19 at 1357  Med List Status: <None>  Medication Order Taking? Sig Documenting Provider Last Dose Status Informant  0.9 %  sodium chloride infusion [id number]   Irene Shipper, MD  Active   albuterol (PROVENTIL HFA) 108 [phone] Base) MCG/ACT inhaler [id number] Yes Inhale 2 puffs into the lungs every 4 (four) hours as needed for wheezing or shortness of breath. Reported on 12/30/2015 Collene Gobble, MD Taking Active Self  albuterol (PROVENTIL) (2.5 MG/3ML) 0.083% nebulizer solution [id number] Yes Take 2.5 mg by nebulization every 4 (four) hours as needed for shortness of breath. Reported on 12/30/2015 Collene Gobble, MD Taking Active Self  aspirin EC 81 MG tablet [id number] Yes Take 81 mg by mouth daily. [provider] Taking Active Self  budesonide-formoterol (SYMBICORT) 160-4.5 MCG/ACT inhaler [id number] Yes Inhale 2 puffs into the lungs 2 (two) times daily for 28 days. Collene Gobble, MD Taking Active   cholecalciferol (VITAMIN D) 1000 UNITS tablet [id number] Yes Take 1,000 Units by mouth daily. [provider] Taking Active Self  clonazePAM (KLONOPIN) 0.5 MG tablet [id number] No TAKE 1 TABLET BY MOUTH TWICE DAILY AS NEEDED FOR ANXIETY [provider]  Not Taking Active Self           Med Note Diana Eves, Oletta Darter   Wed Aug 16, 2017  2:36 PM)    fluticasone (FLONASE) 50 MCG/ACT nasal spray [id number] Yes Place 1 spray into the nose 2 (two) times daily.  [provider] Taking Active Self  folic acid (FOLVITE) 1 MG tablet [id number] Yes Take 1 tablet (1 mg total) by mouth daily. [place], Holli Humbles, NP Taking Active Self  hydroxyurea (HYDREA) 500 MG capsule [id number] Yes Take 1 capsule (500 mg total) by mouth 3 (three) times daily before meals. [name], MD Taking Active   ipratropium-albuterol (DUONEB) 0.5-2.5 (3) MG/3ML SOLN [id number] Yes Take 3 mLs by nebulization 3 (three) times daily. Parrett, Fonnie Mu, NP Taking Active   Iron-FA-B Cmp-C-Biot-Probiotic (FUSION PLUS) CAPS [id number] No Take 1 tablet by mouth daily.  Patient not taking: Reported on 06/11/2019   [name], MD Not Taking Active   Magnesium 200 MG TABS [id number] Yes Take 1 tablet by mouth 2 (two) times daily.  [provider] Taking Active Self  montelukast (SINGULAIR) 10 MG tablet [id number] No Take 1 tablet (10 mg total) by mouth at bedtime. Fenton Foy, NP Unknown Active   omeprazole (PRILOSEC) 20 MG capsule [id number] Yes TAKE 1 TABLET TWICE A DAY. Collene Gobble, MD Taking Active   pravastatin (PRAVACHOL) 40 MG tablet [id number] Yes Take 40 mg by mouth daily. [provider] Taking Active Self  rOPINIRole (REQUIP) 1 MG tablet [id number] Yes Take 1.5 mg by mouth at bedtime as needed.  [provider] Taking Active   sertraline (  ZOLOFT) 50 MG tablet [id number] No Take 50 mg by mouth daily. [provider] Not Taking Active Self          Assessment:  Drugs sorted by system:  Neurologic/Psychologic: Clonazepam, Ropinirole, Sertraline (patient said not taking)  Cardiovascular: Aspirin, Atorvastatin, Pravastatin,   Pulmonary/Allergy: Albuterol HFA, Albuterol nebulizer solution, Symbicort, Fluticasone Nasal Spray,  Montelukast,  Gastrointestinal: Omeprazole,   Vitamins/Minerals/Supplements: Fusion Plus Caps, Magnesium  Miscellaneous: Hydroxyurea  Medication Assistance Findings:  Medication assistance needs identified: Symbicort, Albuterol HFA   Patient reported getting the majority of her medications from the [facility] mail order pharmacy or through samples.  As such, she probably has not spent the required out-of-pocket medication expense required by [place] Patient Assistance program.  Patient appears to meet the requirements for [employer]'s program to receive Proventil HFA and Dulera (substituted for Symbicort) at no cost as the [employer] does not require an out-of-pocket medication expense.  In addition, from initial financial review, an online application was completed for Extra Help/LIS with the patient via telephone.    Additional medication assistance options reviewed with patient as warranted:  No other options identified  Plan: I will route patient assistance letter to [place] technician who will coordinate patient assistance program application process for medications listed above.  [facility] pharmacy technician will assist with obtaining all required documents from both patient and provider(s) and submit application(s) once completed.    Route note to Dr. Lamonte Sakai about potential switch to [facility] from [place], PharmD, [place] Clinical Pharmacist [phone]

## 2019-06-11 NOTE — Patient Outreach (Signed)
[facility]) Care Management  06/11/2019  MARCHEL FOOTE [birth year] [id number]                           Medication Assistance Referral  Referral From: [place]  Medication/Company: Ruthe Mannan / [employer] Patient application portion:  Education officer, museum portion: Interoffice Mailed to Dr. Baltazar Apo  Medication/Company: Proventil HFA / [employer] Patient application portion:  Mailed Provider application portion: Interoffice Mailed to Dr Baltazar Apo   Follow up:  Will follow up with patient in 5-10 business days to confirm application(s) have been received.  Muhammed Teutsch P. Evona Westra, [place] Management [phone]

## 2019-06-17 ENCOUNTER — Encounter: Payer: Self-pay | Admitting: Emergency Medicine

## 2019-06-17 ENCOUNTER — Ambulatory Visit: Payer: Medicare Other | Admitting: Emergency Medicine

## 2019-06-17 ENCOUNTER — Other Ambulatory Visit: Payer: Self-pay

## 2019-06-17 DIAGNOSIS — J309 Allergic rhinitis, unspecified: Secondary | ICD-10-CM

## 2019-06-17 DIAGNOSIS — K219 Gastro-esophageal reflux disease without esophagitis: Secondary | ICD-10-CM | POA: Diagnosis not present

## 2019-06-17 DIAGNOSIS — J383 Other diseases of vocal cords: Secondary | ICD-10-CM | POA: Diagnosis not present

## 2019-06-17 DIAGNOSIS — Z79631 Long term (current) use of antimetabolite agent: Secondary | ICD-10-CM

## 2019-06-17 DIAGNOSIS — J45909 Unspecified asthma, uncomplicated: Secondary | ICD-10-CM

## 2019-06-17 DIAGNOSIS — Z79899 Other long term (current) drug therapy: Secondary | ICD-10-CM

## 2019-06-17 NOTE — Assessment & Plan Note (Signed)
Continue fluticasone nasal spray as you have been taking it Continue Singulair 10 mg once daily

## 2019-06-17 NOTE — Assessment & Plan Note (Signed)
No evidence of methotrexate pulmonary inflammatory process based on chest x-ray 6/29.  Continue surveillance.

## 2019-06-17 NOTE — Assessment & Plan Note (Signed)
She does have obstructive lung disease based on imaging and spirometry.  All the same many of her exacerbations are upper airway in nature.  It is often hard to tell the difference.  Both are exacerbated by GERD, rhinitis.  She underwent a recent prednisone taper and is now feeling improved.  She does still have some upper airway noise.  Please continue Symbicort 2 puffs twice daily.  Remember to rinse and gargle after using. Keep your albuterol available to use 2 puffs if needed for shortness of breath, chest tightness, wheezing. We talked today about possibly doing pulmonary rehab.  Will reconsider going forward. Follow with Dr Lamonte Sakai in 3 months or sooner if you have any problems.

## 2019-06-17 NOTE — Assessment & Plan Note (Signed)
This issue drives most of her symptoms.  She does have some associated asthma.  We will try to control rhinitis, GERD as well as possible.  I do not think her Symbicort is contributing much to upper airway irritation.  She is good about rinsing and gargling.  We will do our best to differentiate between upper and lower airways obstruction, manage contributors.

## 2019-06-17 NOTE — Patient Instructions (Signed)
Please continue Symbicort 2 puffs twice daily.  Remember to rinse and gargle after using. Keep your albuterol available to use 2 puffs if needed for shortness of breath, chest tightness, wheezing. We talked today about possibly doing pulmonary rehab.  Will reconsider going forward. Continue fluticasone nasal spray as you have been taking it Continue Singulair 10 mg once daily Continue omeprazole twice a day as you have been taking it.  Take this medication 1 hour around food. Your chest x-ray 06/03/2019 did not show any evidence of inflammation associated with methotrexate.  This is good news. Follow with Dr Lamonte Sakai in 3 months or sooner if you have any problems.

## 2019-06-17 NOTE — Assessment & Plan Note (Signed)
Continue omeprazole twice a day as you have been taking it.  Take this medication 1 hour around food.

## 2019-06-17 NOTE — Progress Notes (Signed)
Subjective:    Patient ID: [name], female    DOB: [birth year]   MRN: [id number] HPI ROV 03/16/18 -- Pleasant 73 yo woman with a hx mild to moderate persistent asthma and vocal cord dysfunction. Hx of GERD and allergic rhinitis which effect both problems.  She was treated for an acute exacerbation in January 2019 with azithromycin and prednisone.  She was having more dyspnea last week, some cough, wheeze. She believes that the pollen impacted her. Currently managed on Symbicort 2 puffs twice a day, fluticasone nasal spray bid, omeprazole 20 mg twice a day. Averages albuterol about once a day. Good compliance w Symbicort. She is under eval for possible RA by rheumatology. Hasn;t started any therapy yet because she has an equivocal quantGOLD.   ROV 07/10/18 --73 year old woman with suspected rheumatoid arthritis.  Therapy delayed due to an equivocal QuantiFERON gold, but now on MTX 10mg  / wk since April.  Mild to moderate persistent asthma and severe frequently flaring vocal cord dysfunction with associated cough and upper airway obstruction.  She has GERD and allergic rhinitis which are significant contributors.  I last saw her in April. She reports some continued R LE pain, maybe better with the MTX. She feels a bit weaker. She has had more dyspnea, has been using her albuterol more for the last month. Her throat feels more irritated, may have some dysphagia - has been dilated in past by Dr Henrene Pastor. Her omeprazole is working - no overt GERD sx. She is using Symbicort reliably. Uses albuterol at least bid.   ROV 06/17/2019 --[name] is 84, has a history of suspected rheumatoid arthritis being treated with methotrexate, equivocal QuantiFERON gold, mild to moderate persistent asthma and overlapping vocal cord dysfunction with associated upper airway irritation and cough.  She has GERD and allergic rhinitis which are contributors.  She is had more trouble over spring and summer months, more dyspnea.  Zyrtec  was added as was Spiriva, but she was unable to obtain and take the Spiriva. Was treated with prednisone. She remains on Symbicort, fluticasone nasal spray, Singulair, omeprazole twice a day. Off zyrtec. Uses albuterol 1-2x a day. She has exertional dyspnea. Then 2 weeks ago she was seen in ED for L CP, dyspnea, wheeze. Was treated again with a pred taper. She is improved now. A CXR on 6/29 was normal - no evidence MTX inflammatory change.    ROS:  As per HPI.    Objective:   Physical Exam Vitals:   06/17/19 0952  BP: 104/70  Pulse: 90  SpO2: 100%  Weight: 187 lb (84.8 kg)  Height: 5\' 2"  (1.575 m)   Gen: Pleasant, overwt, in no distress,  normal affect, rare cough  ENT: No lesions,  mouth clear,  oropharynx clear, no postnasal drip, strong voice today  Neck: supple, no significant stridor  Lungs : no wheeze today  Cardiovascular: RRR, heart sounds normal, no murmur or gallops, no peripheral edema  Musculoskeletal: No deformities, no cyanosis or clubbing  Neuro: alert, non focal  Skin: Warm, no rash    Assessment & Plan:   Intrinsic asthma She does have obstructive lung disease based on imaging and spirometry.  All the same many of her exacerbations are upper airway in nature.  It is often hard to tell the difference.  Both are exacerbated by GERD, rhinitis.  She underwent a recent prednisone taper and is now feeling improved.  She does still have some upper airway noise.  Please continue Symbicort 2  puffs twice daily.  Remember to rinse and gargle after using. Keep your albuterol available to use 2 puffs if needed for shortness of breath, chest tightness, wheezing. We talked today about possibly doing pulmonary rehab.  Will reconsider going forward. Follow with Dr Lamonte Sakai in 3 months or sooner if you have any problems.  Allergic rhinitis Continue fluticasone nasal spray as you have been taking it Continue Singulair 10 mg once daily   Vocal cord dysfunction This issue  drives most of her symptoms.  She does have some associated asthma.  We will try to control rhinitis, GERD as well as possible.  I do not think her Symbicort is contributing much to upper airway irritation.  She is good about rinsing and gargling.  We will do our best to differentiate between upper and lower airways obstruction, manage contributors.  On methotrexate therapy No evidence of methotrexate pulmonary inflammatory process based on chest x-ray 6/29.  Continue surveillance.  Esophageal reflux Continue omeprazole twice a day as you have been taking it.  Take this medication 1 hour around food.  Baltazar Apo, MD, PhD 06/17/2019, 10:13 AM [facility] [phone] or if no answer [phone]

## 2019-06-20 ENCOUNTER — Inpatient Hospital Stay (HOSPITAL_BASED_OUTPATIENT_CLINIC_OR_DEPARTMENT_OTHER): Payer: Medicare Other | Admitting: Hematology & Oncology

## 2019-06-20 ENCOUNTER — Other Ambulatory Visit: Payer: Self-pay

## 2019-06-20 ENCOUNTER — Inpatient Hospital Stay: Payer: Medicare Other | Attending: Hematology & Oncology

## 2019-06-20 ENCOUNTER — Encounter: Payer: Self-pay | Admitting: Hematology & Oncology

## 2019-06-20 VITALS — BP 128/79 | HR 78 | Temp 97.8°F | Resp 16 | Wt 188.0 lb

## 2019-06-20 DIAGNOSIS — Z7982 Long term (current) use of aspirin: Secondary | ICD-10-CM

## 2019-06-20 DIAGNOSIS — D473 Essential (hemorrhagic) thrombocythemia: Secondary | ICD-10-CM | POA: Diagnosis not present

## 2019-06-20 DIAGNOSIS — D573 Sickle-cell trait: Secondary | ICD-10-CM | POA: Insufficient documentation

## 2019-06-20 DIAGNOSIS — Z79899 Other long term (current) drug therapy: Secondary | ICD-10-CM

## 2019-06-20 LAB — CMP (CANCER CENTER ONLY)
ALT: 17 U/L (ref 0–44)
AST: 16 U/L (ref 15–41)
Albumin: 3.8 g/dL (ref 3.5–5.0)
Alkaline Phosphatase: 59 U/L (ref 38–126)
Anion gap: 7 (ref 5–15)
BUN: 10 mg/dL (ref 8–23)
CO2: 28 mmol/L (ref 22–32)
Calcium: 8.3 mg/dL — ABNORMAL LOW (ref 8.9–10.3)
Chloride: 106 mmol/L (ref 98–111)
Creatinine: 0.85 mg/dL (ref 0.44–1.00)
GFR, Est AFR Am: >60 mL/min (ref >60)
GFR, Estimated: >60 mL/min (ref >60)
Glucose, Bld: 81 mg/dL (ref 70–99)
Potassium: 3.9 mmol/L (ref 3.5–5.1)
Sodium: 141 mmol/L (ref 135–145)
Total Bilirubin: 0.6 mg/dL (ref 0.3–1.2)
Total Protein: 6.2 g/dL — ABNORMAL LOW (ref 6.5–8.1)

## 2019-06-20 LAB — CBC WITH DIFFERENTIAL (CANCER CENTER ONLY)
Abs Immature Granulocytes: 0.06 10*3/uL (ref 0.00–0.07)
Basophils Absolute: 0.1 10*3/uL (ref 0.0–0.1)
Basophils Relative: 1 %
Eosinophils Absolute: 0.1 10*3/uL (ref 0.0–0.5)
Eosinophils Relative: 2 %
HCT: 39.1 % (ref 36.0–46.0)
Hemoglobin: 12.4 g/dL (ref 12.0–15.0)
Immature Granulocytes: 1 %
Lymphocytes Relative: 22 %
Lymphs Abs: 1.4 10*3/uL (ref 0.7–4.0)
MCH: 25.7 pg — ABNORMAL LOW (ref 26.0–34.0)
MCHC: 31.7 g/dL (ref 30.0–36.0)
MCV: 81 fL (ref 80.0–100.0)
Monocytes Absolute: 0.4 10*3/uL (ref 0.1–1.0)
Monocytes Relative: 7 %
Neutro Abs: 4.3 10*3/uL (ref 1.7–7.7)
Neutrophils Relative %: 67 %
Platelet Count: 406 10*3/uL — ABNORMAL HIGH (ref 150–400)
RBC: 4.83 MIL/uL (ref 3.87–5.11)
RDW: 16.5 % — ABNORMAL HIGH (ref 11.5–15.5)
WBC Count: 6.2 10*3/uL (ref 4.0–10.5)
nRBC: 0 % (ref 0.0–0.2)

## 2019-06-20 LAB — SAVE SMEAR(SSMR), FOR PROVIDER SLIDE REVIEW

## 2019-06-20 LAB — RETICULOCYTES
Immature Retic Fract: 9.4 % (ref 2.3–15.9)
RBC.: 4.81 MIL/uL (ref 3.87–5.11)
Retic Count, Absolute: 39.4 10*3/uL (ref 19.0–186.0)
Retic Ct Pct: 0.8 % (ref 0.4–3.1)

## 2019-06-20 LAB — IRON AND TIBC
Iron: 128 ug/dL (ref 41–142)
Saturation Ratios: 48 % (ref 21–57)
TIBC: 266 ug/dL (ref 236–444)
UIBC: 138 ug/dL (ref 120–384)

## 2019-06-20 LAB — FERRITIN: Ferritin: 162 ng/mL (ref 11–307)

## 2019-06-20 LAB — LACTATE DEHYDROGENASE: LDH: 338 U/L — ABNORMAL HIGH (ref 98–192)

## 2019-06-20 NOTE — Progress Notes (Signed)
Hematology and Oncology Follow Up Visit  [name] [id number] [birth year] 73 y.o. 06/20/2019   Principle Diagnosis:  Essential thrombocythemia - JAK2 POSITIVE Sickle cell trait  Current Therapy:   Hydrea 500 mg PO TID - dose changed on 78/29/5621 Folic acid 1 mg by mouth daily Aspirin 81 mg daily    Interim History:  [name] is here today for follow-up.  Her biggest problem clinically is her asthma.  She did go to the emergency room once again.  Is on inhalers.  The hot humid summer air is certainly taken is still on her.  The increase in the Hydrea is helped her quite a bit.  Her platelet count is going back slowly.  It is not affected her white cell count or hemoglobin.  There is been no issues with nausea or vomiting.  She has had no bleeding.  She has had no change in bowel or bladder habits.  She has had no rashes.  There is been no leg swelling.  Overall, I would say her performance status is ECOG 1.  Medications:  Allergies as of 06/20/2019      Reactions   Penicillins Itching, Rash      Medication List       Accurate as of June 20, 2019  9:54 AM. If you have any questions, ask your nurse or doctor.        albuterol (2.5 MG/3ML) 0.083% nebulizer solution Commonly known as: PROVENTIL Take 2.5 mg by nebulization every 4 (four) hours as needed for shortness of breath. Reported on 12/30/2015   albuterol 108 (90 Base) MCG/ACT inhaler Commonly known as: Proventil HFA Inhale 2 puffs into the lungs every 4 (four) hours as needed for wheezing or shortness of breath. Reported on 12/30/2015   aspirin EC 81 MG tablet Take 81 mg by mouth daily.   budesonide-formoterol 160-4.5 MCG/ACT inhaler Commonly known as: Symbicort Inhale 2 puffs into the lungs 2 (two) times daily for 28 days.   cholecalciferol 1000 units tablet Commonly known as: VITAMIN D Take 1,000 Units by mouth daily.   clonazePAM 0.5 MG tablet Commonly known as: KLONOPIN TAKE 1 TABLET BY MOUTH  TWICE DAILY AS NEEDED FOR ANXIETY   fluticasone 50 MCG/ACT nasal spray Commonly known as: FLONASE Place 1 spray into the nose 2 (two) times daily.   folic acid 1 MG tablet Commonly known as: FOLVITE Take 1 tablet (1 mg total) by mouth daily.   hydroxyurea 500 MG capsule Commonly known as: HYDREA Take 1 capsule (500 mg total) by mouth 3 (three) times daily before meals.   ipratropium-albuterol 0.5-2.5 (3) MG/3ML Soln Commonly known as: DUONEB Take 3 mLs by nebulization 3 (three) times daily.   Magnesium 200 MG Tabs Take 1 tablet by mouth 2 (two) times daily.   montelukast 10 MG tablet Commonly known as: SINGULAIR Take 1 tablet (10 mg total) by mouth at bedtime.   omeprazole 20 MG capsule Commonly known as: PRILOSEC TAKE 1 TABLET TWICE A DAY.   pravastatin 40 MG tablet Commonly known as: PRAVACHOL Take 40 mg by mouth daily.   rOPINIRole 1 MG tablet Commonly known as: REQUIP Take 1.5 mg by mouth at bedtime as needed.   sertraline 50 MG tablet Commonly known as: ZOLOFT Take 50 mg by mouth daily.       Allergies:  Allergies  Allergen Reactions  . Penicillins Itching and Rash    Past Medical History, Surgical history, Social history, and Family History were reviewed and updated.  Review of Systems: All other 10 point review of systems is negative.   Physical Exam:  weight is 188 lb (85.3 kg). Her oral temperature is 97.8 F (36.6 C). Her blood pressure is 128/79 and her pulse is 78. Her respiration is 16 and oxygen saturation is 100%.   Wt Readings from Last 3 Encounters:  06/20/19 188 lb (85.3 kg)  06/17/19 187 lb (84.8 kg)  06/03/19 187 lb (84.8 kg)    Ocular: Sclerae unicteric, pupils equal, round and reactive to light Ear-nose-throat: Oropharynx clear, dentition fair Lymphatic: No cervical, supraclavicular or axillary adenopathy Lungs no rales or rhonchi, good excursion bilaterally Heart regular rate and rhythm, no murmur appreciated Abd soft,  nontender, positive bowel sounds, no liver or spleen tip palpated on exam, no fluid wave  MSK no focal spinal tenderness, no joint edema Neuro: non-focal, well-oriented, appropriate affect Breasts: Deferred   Lab Results  Component Value Date   WBC 6.2 06/20/2019   HGB 12.4 06/20/2019   HCT 39.1 06/20/2019   MCV 81.0 06/20/2019   PLT 406 (H) 06/20/2019   Lab Results  Component Value Date   FERRITIN 49 05/20/2019   IRON 74 05/20/2019   TIBC 263 05/20/2019   UIBC 189 05/20/2019   IRONPCTSAT 28 05/20/2019   Lab Results  Component Value Date   RETICCTPCT 0.8 06/20/2019   RBC 4.81 06/20/2019   RBC 4.83 06/20/2019   RETICCTABS 73.1 08/06/2015   No results found for: KPAFRELGTCHN, LAMBDASER, KAPLAMBRATIO No results found for: IGGSERUM, IGA, IGMSERUM No results found for: Odetta Pink, SPEI   Chemistry      Component Value Date/Time   NA 141 06/20/2019 0846   NA 145 11/24/2017 0754   NA 142 05/25/2017 0755   K 3.9 06/20/2019 0846   K 3.7 11/24/2017 0754   K 3.9 05/25/2017 0755   CL 106 06/20/2019 0846   CL 104 11/24/2017 0754   CO2 28 06/20/2019 0846   CO2 27 11/24/2017 0754   CO2 24 05/25/2017 0755   BUN 10 06/20/2019 0846   BUN 12 11/24/2017 0754   BUN 13.3 05/25/2017 0755   CREATININE 0.85 06/20/2019 0846   CREATININE 1.0 11/24/2017 0754   CREATININE 0.8 05/25/2017 0755      Component Value Date/Time   CALCIUM 8.3 (L) 06/20/2019 0846   CALCIUM 9.1 11/24/2017 0754   CALCIUM 9.5 05/25/2017 0755   ALKPHOS 59 06/20/2019 0846   ALKPHOS 70 11/24/2017 0754   ALKPHOS 74 05/25/2017 0755   AST 16 06/20/2019 0846   AST 21 05/25/2017 0755   ALT 17 06/20/2019 0846   ALT 18 11/24/2017 0754   ALT 14 05/25/2017 0755   BILITOT 0.6 06/20/2019 0846   BILITOT 0.37 05/25/2017 0755       Impression and Plan: [name] is a very pleasant 73 yo African American female with essential thrombocythemia, that is JAK2  positive.  In addition, she has the sickle cell trait.  I will not adjust the dose of Hydrea for right now.  I think we can see her back in 2 months now.  I like the trend that her platelet count is taking right now.  Again, I think her asthma is notably her biggest problem right now given the summer air.    [name], MD 7/16/20209:54 AM

## 2019-06-24 ENCOUNTER — Other Ambulatory Visit: Payer: Self-pay | Admitting: Pharmacy Technician

## 2019-06-24 NOTE — Patient Outreach (Signed)
[facility] [facility]) Care Management  06/24/2019  ISOBELLA ASCHER 13-Jul-1946 [id number]   Successful outreach call placed to patient in regards to [employer] application for [facility] and Proventil.  Spoke to patient, HIPAA identifiers verified.  Patient informed she did not receive the application that was mailed to her on 06/11/2019. Informed patient I would mail her another application and followup with her in a week. Patient was agreeable.  Plan to mail patient another application when back in the office and followup wn 5-7 business days.  Prather Failla P. Tarick Parenteau, [place] Management [phone]

## 2019-06-27 ENCOUNTER — Ambulatory Visit: Payer: Medicare Other | Admitting: Emergency Medicine

## 2019-07-01 ENCOUNTER — Other Ambulatory Visit: Payer: Self-pay | Admitting: Pharmacy Technician

## 2019-07-01 NOTE — Patient Outreach (Signed)
[facility]) Care Management  07/01/2019  [name] 27-Nov-1946 [id number]   Sucessful outreach call placed to patient in regards to [employer] application for [employer] and Proventil.  Spoke to patient, HIPAA identifers verified.  Patient informed she has received the application. She informed she had some questions which we discussed. Patient informed she would place the application in the mail back to [place] today.  Will followup with patient in 5-15 business days if application has not been received.  Birl Lobello P. Zaela Graley, [place] Management [phone]

## 2019-07-02 ENCOUNTER — Ambulatory Visit: Payer: Self-pay | Admitting: Pharmacist

## 2019-07-15 ENCOUNTER — Telehealth: Payer: Self-pay | Admitting: Emergency Medicine

## 2019-07-15 ENCOUNTER — Other Ambulatory Visit: Payer: Self-pay | Admitting: Pharmacy Technician

## 2019-07-15 NOTE — Telephone Encounter (Signed)
LMOM for [name] that I am checking with [name] about the status of pt assistance forms for [facility] and Proventil  [name], have you seen any forms on this pt? I do not see anything up front. Please advise thanks!

## 2019-07-15 NOTE — Telephone Encounter (Signed)
OK- will await call back from [place] so we can let her know that forms not received

## 2019-07-15 NOTE — Telephone Encounter (Signed)
I haven't see any forms for this patient.

## 2019-07-15 NOTE — Patient Outreach (Signed)
[place] [facility]) Care Management  07/15/2019  [name] June 22, 1946 [id number]   Care coordination call placed to Dr. Agustina Caroli office in regards to [employer] application for [employer] and Proventil.  Upon dialing the number, pressed 1 for the nurse line where I spoke to [name].   [name] took my message and my number and informed she would have someone call me back.  Was calling about the inter office provider application that was sent on 06/11/2019. Inquiring about the status of that application as it has not been received back.  Once the application is received back from the provider then we can put it with the patient's part and mail to [employer].  [name], [place] Management [phone]

## 2019-07-18 NOTE — Telephone Encounter (Signed)
I attempted to contact [place]. Her line rang several times with no voicemail picking up. Will try back.

## 2019-07-19 ENCOUNTER — Other Ambulatory Visit: Payer: Self-pay | Admitting: Pharmacy Technician

## 2019-07-19 NOTE — Telephone Encounter (Signed)
Spoke with [name], she states she resent the forms to [name]'s attention on Tuesday through interoffice mail. [name]

## 2019-07-19 NOTE — Patient Outreach (Signed)
[place] [facility]) Care Management  07/19/2019  [name] [birth year] [id number]   Incoming call received from [facility] in regards to patient's [employer] application for [employer] and Proventil.  [name] was calling to followup on the [employer] patient assistance provider portion of the application.  Informed [name] that the forms were re sent to [name] attention via inter office mail. The form was prepared on Tuesday but probably did not officially go out with inter office mail until Wednesday.   [name] informed she would inform [name].  Will submit application to [employer] via mail once the forms are sent back.  [name] P. Donata Reddick, [place] Management [phone]

## 2019-07-23 NOTE — Telephone Encounter (Signed)
[name], please advise if you have now received these forms or if the forms still have not come.

## 2019-07-23 NOTE — Telephone Encounter (Signed)
These forms have been received and given to RB to complete. Will update chart once they are returned to me.

## 2019-07-24 NOTE — Telephone Encounter (Signed)
Forms have been signed and returned to me. These have been placed in the outgoing mail to be returned to [place] by [employer]. Nothing further was needed.

## 2019-07-25 ENCOUNTER — Other Ambulatory Visit: Payer: Self-pay | Admitting: Pharmacy Technician

## 2019-07-25 NOTE — Patient Outreach (Signed)
[facility]) Care Management  07/25/2019  [name] [birth year] [id number]    Received all necessary documents and signatures from both patient and provider for [employer] patient assistance for [facility] and Proventil.  Submitted completed application to [employer] via mail.  Will followup with [employer] in 10-15 business days to inquire on status of application.  Boykin Baetz P. Thessaly Mccullers, [place] Management [phone]

## 2019-08-02 ENCOUNTER — Ambulatory Visit: Payer: Self-pay | Admitting: Pharmacist

## 2019-08-08 ENCOUNTER — Other Ambulatory Visit: Payer: Self-pay | Admitting: Pharmacy Technician

## 2019-08-08 NOTE — Patient Outreach (Signed)
[place] [facility]) Care Management  08/08/2019  HAZLE MCNAIR 1946-09-14 [id number]    Care coordination call placed to [employer] in regards to patient's application for [facility] and Proventil.  Spoke to [place] who informed the application was received on 08/07/2019 and the attestation form was mailed out on the same date.  Will outreach patient with this information.  Jeanclaude Wentworth P. Brasen Bundren, [place] Management [phone]

## 2019-08-08 NOTE — Patient Outreach (Signed)
[place] [facility]) Care Management  08/08/2019  [name] 05-Dec-1946 [id number]    Successful outreach call placed to patient in regards to [employer] application for [facility] and Proventil.  Spoke to patient, HIPAA identifiers verified.  Informed patient that [employer] received her application and in turn mailed out an attestation form that she would need to complete. Informed patient to call me when she receives the letter and we can go over it together. Patient verbalized understanding and provider her my phone number.  Will followup with patient in 10-15 business days if call is not returned.  Dameka Younker P. Jef Futch, [place] Management [phone]

## 2019-08-14 ENCOUNTER — Other Ambulatory Visit: Payer: Self-pay | Admitting: Pharmacy Technician

## 2019-08-14 NOTE — Patient Outreach (Signed)
[place] [facility]) Care Management  08/14/2019  [name] [birth year] [id number]   Incoming call received from patient in regards to [employer]ck application for [facility] and Proventil.  Spoke to patient, HIPAA identifiers verified.  Patient was calling to inform that she received the attestation form that [employer] sent her. Discussed the form with the patient and explained it to her. Patient was able to complete the form while I was on the line. Patient informed she would place the form and the original application in the enclosed envelope from [employer] and place in the mail today.  Will followup with [employer] in 7-10 business days to confirm receipt of attestation form and check on delivery status.  Avantae Bither P. Cadience Bradfield, [place] Management [phone]

## 2019-08-22 ENCOUNTER — Ambulatory Visit: Payer: Medicare Other | Admitting: Hematology & Oncology

## 2019-08-22 ENCOUNTER — Other Ambulatory Visit: Payer: Medicare Other

## 2019-08-26 ENCOUNTER — Other Ambulatory Visit: Payer: Self-pay | Admitting: Pharmacy Technician

## 2019-08-26 NOTE — Patient Outreach (Signed)
[place] [facility]) Care Management  08/26/2019  NAELI GEIS Jun 09, 1946 [id number]  Care coordination call placed to [employer] in regards to patient's application for Dulera and Proventil HFa.  Spoke to [place] at [employer] who informed patient had been APPROVED 08/20/2019-12/05/2019. Adonis Huguenin informed she would transfer me to [employer] as she does not have any tracking information.  Spoke to [place] at [employer] who informed clarification is needed on the [place] order. She informed it is not legible. She informed a form was sent to the office on 08/21/2019 but has not been received back. She informed it would be best for the provider to fill out the form and send it back but if they could not locate the form then they could place a call to them at [phone]. She also informed hardcopy scripts for BOTH medications, Dulera and Proventil, could be faxed to them at [phone].  Sent an inbasket message to [place] at Dr. Agustina Caroli office for assistance with the order clarifications..  Will followup with [employer]'s [facility] [place] in 3-5 business days.  Nyheim Seufert P. Mack[place]ee Becvar, [place] Management [phone]

## 2019-08-30 ENCOUNTER — Other Ambulatory Visit: Payer: Self-pay | Admitting: Pharmacy Technician

## 2019-08-30 NOTE — Patient Outreach (Signed)
[place] [facility]) Care Management  08/30/2019  LI SANSOM [birth year] [id number]  Care coordination call placed to [employer] in regards to patient's application for [facility] and Proventil.  Spoke to April who informed she has no tracking information and she transferred me to [place] at [employer].  Spoke to [place] at [employer] who said the application was on hold and that he would have to transfer me a pharmaist.  Spoke to [place] who informed the [facility] strength looks like 500. Informed her the application I have says 200.  [place] informed they would begin the process to ship the patient the medications.  Will followup with [employer] and/or patient in 7-10 business days to inquire about shipment receipt.  Lavonna Lampron P. Deval Mroczka, [place] Management [phone]

## 2019-09-09 ENCOUNTER — Other Ambulatory Visit: Payer: Self-pay | Admitting: Pharmacy Technician

## 2019-09-09 NOTE — Patient Outreach (Signed)
[facility]) Care Management  09/09/2019  [name] Feb 05, 1946 [id number]   Care coordination call placed to [employer] in regards to patient's application for [facility] and Proventil.  Spoke to Mickel Baas who informed medication was shipped out on 09/06/2019. Patient will be receiving a 50 days supply of Proventil and a 90 days supply of Dulera. Patient will have to re enroll for 2021. They will start taking applications after Dec 1. The tracking number for the shipment is [phone].  Will followup with patient in 7-14 business days to inquire if medication was received.  [name] P. Orris Perin, [place] Management [phone]

## 2019-09-11 ENCOUNTER — Ambulatory Visit: Payer: Self-pay | Admitting: Pharmacist

## 2019-09-16 ENCOUNTER — Encounter: Payer: Self-pay | Admitting: Emergency Medicine

## 2019-09-16 ENCOUNTER — Ambulatory Visit: Payer: Medicare Other | Admitting: Emergency Medicine

## 2019-09-16 ENCOUNTER — Other Ambulatory Visit: Payer: Self-pay

## 2019-09-16 DIAGNOSIS — K219 Gastro-esophageal reflux disease without esophagitis: Secondary | ICD-10-CM

## 2019-09-16 DIAGNOSIS — J309 Allergic rhinitis, unspecified: Secondary | ICD-10-CM | POA: Diagnosis not present

## 2019-09-16 DIAGNOSIS — Z23 Encounter for immunization: Secondary | ICD-10-CM | POA: Diagnosis not present

## 2019-09-16 DIAGNOSIS — J45909 Unspecified asthma, uncomplicated: Secondary | ICD-10-CM

## 2019-09-16 DIAGNOSIS — J383 Other diseases of vocal cords: Secondary | ICD-10-CM

## 2019-09-16 MED ORDER — BUDESONIDE-FORMOTEROL FUMARATE 160-4.5 MCG/ACT IN AERO: 2.0000 | INHALATION_SPRAY | Freq: Two times a day (BID) | RESPIRATORY_TRACT | 0 refills | Status: DC

## 2019-09-16 MED ORDER — BENZONATATE 200 MG PO CAPS: 200.0000 mg | ORAL_CAPSULE | Freq: Three times a day (TID) | ORAL | 1 refills | Status: DC | PRN

## 2019-09-16 NOTE — Progress Notes (Signed)
  Subjective:    Patient ID: [name], female    DOB: [birth year]   MRN: [id number] HPI  ROV 09/16/2019 --follow-up visit for [name].  She is 57 with a history of autoimmune disease, suspected RA currently on methotrexate.  She has vocal cord dysfunction with chronic upper airway irritation and cough superimposed on mild to moderate persistent asthma.  She has GERD and allergic rhinitis which can cause flares.  Currently managed on Symbicort, Singulair.  Uses fluticasone nasal spray, omeprazole.  Her symptoms have been a bit more labile, she had some episodes of wheeze that woke her from sleep - she used her nebulized albuterol with some relief. She has also had a bit more cough. Her voice has remained strong. Some throat clearing. Overall fairly good control. She is using albuterol more frequently last week - bid.    ROS:  Cough, dyspnea. Nocturnal awakenings - required albuterol Moderate exercise limitation   Objective:   Physical Exam Vitals:   09/16/19 0857  BP: 132/64  Pulse: 94  Temp: (!) 97.3 F (36.3 C)  TempSrc: Oral  SpO2: 100%  Weight: 185 lb 6.4 oz (84.1 kg)  Height: 5\' 2"  (1.575 m)   Gen: Pleasant, overwt, in no distress,  normal affect, cleared her throat a few times   ENT: No lesions,  mouth clear,  oropharynx clear, no postnasal drip, strong voice  Neck: supple, coarse insp and exp stridor today  Lungs : distant, referred UA noise, no real wheeze  Cardiovascular: RRR, heart sounds normal, no murmur or gallops, no peripheral edema  Musculoskeletal: No deformities, no cyanosis or clubbing  Neuro: alert, non focal  Skin: Warm, no rash    Assessment & Plan:   Intrinsic asthma Overall stable.  Her symptoms do become a bit more active during the allergy months.  Right now she has significant upper airway noise.  She did benefit from her nebulized bronchodilator on a nocturnal awakening over the weekend.  No evidence for acute exacerbation, bronchospasm  at this time.  Please continue Symbicort 2 puffs twice daily as you have been taking it.  Rinse and gargle after using. You can use your albuterol either 2 puffs or 1 nebulizer treatment up to every 4 hours if needed for shortness of breath, chest tightness, wheezing. Continue Singulair 10 mg each evening. Flu shot today You would probably benefit from getting the Prevnar-13 pneumonia shot at one of our future visits Follow with Dr Lamonte Sakai in 3 months or sooner if you have any problems.  Allergic rhinitis Continue fluticasone nasal spray, 2 sprays each nostril once daily. Try adding loratadine 10 mg once daily   Esophageal reflux Please continue your omeprazole as you have been taking it.  Vocal cord dysfunction She has upper airway noise today.  Voice is strong.  Her symptoms are up and down but overall fairly well controlled.  Talked about controlling exacerbating factors, avoiding throat clearing, spicy food, etc. Will refill her tessalon for cough suppression.   Baltazar Apo, MD, PhD 09/16/2019, 9:20 AM [facility] [phone] or if no answer [phone]

## 2019-09-16 NOTE — Addendum Note (Signed)
Addended by: Len Blalock on: 09/16/2019 09:23 AM   Modules accepted: Orders

## 2019-09-16 NOTE — Patient Instructions (Addendum)
Please continue Symbicort 2 puffs twice daily as you have been taking it.  Rinse and gargle after using. You can use your albuterol either 2 puffs or 1 nebulizer treatment up to every 4 hours if needed for shortness of breath, chest tightness, wheezing. Continue Singulair 10 mg each evening. Flu shot today You would probably benefit from getting the Prevnar-13 pneumonia shot at one of our future visits Continue fluticasone nasal spray, 2 sprays each nostril once daily. Try adding loratadine 10 mg once daily Please continue your omeprazole as you have been taking it. We will follow your chest x-ray intermittently since you are taking methotrexate.  We do not need to do one right now. Follow with Dr Lamonte Sakai in 3 months or sooner if you have any problems.

## 2019-09-16 NOTE — Assessment & Plan Note (Signed)
Overall stable.  Her symptoms do become a bit more active during the allergy months.  Right now she has significant upper airway noise.  She did benefit from her nebulized bronchodilator on a nocturnal awakening over the weekend.  No evidence for acute exacerbation, bronchospasm at this time.  Please continue Symbicort 2 puffs twice daily as you have been taking it.  Rinse and gargle after using. You can use your albuterol either 2 puffs or 1 nebulizer treatment up to every 4 hours if needed for shortness of breath, chest tightness, wheezing. Continue Singulair 10 mg each evening. Flu shot today You would probably benefit from getting the Prevnar-13 pneumonia shot at one of our future visits Follow with Dr Lamonte Sakai in 3 months or sooner if you have any problems.

## 2019-09-16 NOTE — Assessment & Plan Note (Signed)
Continue fluticasone nasal spray, 2 sprays each nostril once daily. Try adding loratadine 10 mg once daily

## 2019-09-16 NOTE — Assessment & Plan Note (Signed)
Please continue your omeprazole as you have been taking it. 

## 2019-09-16 NOTE — Assessment & Plan Note (Signed)
She has upper airway noise today.  Voice is strong.  Her symptoms are up and down but overall fairly well controlled.  Talked about controlling exacerbating factors, avoiding throat clearing, spicy food, etc. Will refill her tessalon for cough suppression.

## 2019-09-17 ENCOUNTER — Other Ambulatory Visit: Payer: Self-pay | Admitting: Pharmacy Technician

## 2019-09-17 NOTE — Patient Outreach (Signed)
[facility]) Care Management  09/17/2019  SHELBEY CONQUEST [birth year] [id number]   Successful outreach call placed to patient in regards to [employer] application for [facility] and Proventil.  Spoke to patient, HIPAA identifiers verified.  Patient informed she received 3 inhalers of Dulera and 3 inhalers of  Proventil HFA. Patient was informed again that the [facility] takes the place of Symbicort and should not be used together. Discussed refill procedure with patient. Patient verbalized understanding to the information relayed to her. Patient informed she had no other questions or concerns at this time. Confirmed patient had name and number.  Will route note to [place] that patient assistance has been completed and will remove myself from care team.  Luiz Ochoa. Davius Goudeau, [place] Management [phone]

## 2019-09-18 DIAGNOSIS — D473 Essential (hemorrhagic) thrombocythemia: Secondary | ICD-10-CM | POA: Diagnosis not present

## 2019-09-18 DIAGNOSIS — E782 Mixed hyperlipidemia: Secondary | ICD-10-CM | POA: Diagnosis not present

## 2019-09-23 DIAGNOSIS — Z7189 Other specified counseling: Secondary | ICD-10-CM | POA: Diagnosis not present

## 2019-09-23 DIAGNOSIS — E782 Mixed hyperlipidemia: Secondary | ICD-10-CM | POA: Diagnosis not present

## 2019-09-23 DIAGNOSIS — M059 Rheumatoid arthritis with rheumatoid factor, unspecified: Secondary | ICD-10-CM | POA: Diagnosis not present

## 2019-09-23 DIAGNOSIS — D473 Essential (hemorrhagic) thrombocythemia: Secondary | ICD-10-CM | POA: Diagnosis not present

## 2019-10-04 ENCOUNTER — Telehealth: Payer: Self-pay | Admitting: Emergency Medicine

## 2019-10-04 NOTE — Telephone Encounter (Signed)
I received paperwork from [place] cubby and placed it in his box in the B pod. RB please sign financial assistance papers and return.

## 2019-10-08 NOTE — Telephone Encounter (Signed)
Forms have been placed in RB's sign folder.

## 2019-10-08 NOTE — Telephone Encounter (Signed)
Completed 10/08/2019

## 2019-10-15 ENCOUNTER — Other Ambulatory Visit: Payer: Self-pay | Admitting: Pharmacist

## 2019-10-15 ENCOUNTER — Ambulatory Visit: Payer: Self-pay | Admitting: Pharmacist

## 2019-10-15 NOTE — Patient Outreach (Signed)
[place] [facility]) Care Management  10/15/2019  [name] 21-Jun-1946 [id number]   Patient received 3 Dulera and 3  Proventil HFA inhalers from [facility] Patient Assistance Program. She communicated understanding on how to reorder her inhalers and did not report any additional medication issues to [facility], CPhT during their 09/17/2019 telephone encounter.  As such, the patient's case will be closed. We will gladly reopen the patient's case upon need.  Plan: Send closure letters to patient and her provider.  Elayne Guerin, PharmD, [place] Clinical Pharmacist [phone]

## 2019-12-10 ENCOUNTER — Encounter: Payer: Self-pay | Admitting: Adult Health

## 2019-12-10 ENCOUNTER — Ambulatory Visit (INDEPENDENT_AMBULATORY_CARE_PROVIDER_SITE_OTHER): Payer: Medicare Other | Admitting: Adult Health

## 2019-12-10 ENCOUNTER — Other Ambulatory Visit: Payer: Self-pay

## 2019-12-10 ENCOUNTER — Telehealth: Payer: Self-pay | Admitting: Emergency Medicine

## 2019-12-10 DIAGNOSIS — J309 Allergic rhinitis, unspecified: Secondary | ICD-10-CM | POA: Diagnosis not present

## 2019-12-10 DIAGNOSIS — J4541 Moderate persistent asthma with (acute) exacerbation: Secondary | ICD-10-CM | POA: Diagnosis not present

## 2019-12-10 MED ORDER — BENZONATATE 200 MG PO CAPS: 200.0000 mg | ORAL_CAPSULE | Freq: Three times a day (TID) | ORAL | 1 refills | Status: DC | PRN

## 2019-12-10 MED ORDER — PREDNISONE 10 MG PO TABS: ORAL_TABLET | ORAL | 0 refills | Status: DC

## 2019-12-10 NOTE — Telephone Encounter (Signed)
Called and spoke to pt. Pt requesting a pred taper. Pt c/o increase in SOB and dry cough. Pt denies CP/tightness, f/c/s, body aches. Pt last seen in 09/2019 by RB and advised to follow up in 3 months. Televisit appt scheduled with TP this afternoon. Pt verbalized understanding and denied any further questions or concerns at this time.

## 2019-12-10 NOTE — Patient Instructions (Addendum)
Prednisone taper over next week .  Mucinex DM Twice daily  As needed  Cough/congestion  Tessalon Three times a day  As needed  Cough .  Continue on Symbicort 2 puffs Twice daily  , rinse after use.  May Duoneb Three times a day.  Continue on Zyrtec 10mg  At bedtime  .  Follow up with Dr. Lamonte Sakai  Or  Jamieon Lannen NP in 4-6   weeks and As needed  Please contact office for sooner follow up if symptoms do not improve or worsen or seek emergency care

## 2019-12-10 NOTE — Progress Notes (Signed)
Virtual Visit via Telephone Note  I connected with [name] on 12/10/19 at  3:00 PM EST by telephone and verified that I am speaking with the correct person using two identifiers.  Location: Patient: Home  Provider: Office    I discussed the limitations, risks, security and privacy concerns of performing an evaluation and management service by telephone and the availability of in person appointments. I also discussed with the patient that there may be a patient responsible charge related to this service. The patient expressed understanding and agreed to proceed.   History of Present Illness: 74 year old female former smoker followed for asthma, vocal cord dysfunction complicated by GERD and allergic rhinitis Medical history significant for esophageal stricture, thrombocytopenia  Today's televisit is an acute office visit for asthma and cough.  Patient complains over the last 2 weeks that she has had increased wheezing and dry cough.  She denies any fever, discolored mucus, chest pain orthopnea PND or leg swelling.  She has no loss of taste or smell.  No known sick contacts.  Patient says she is social distancing and staying at home.  She remains on Symbicort twice daily.  Uses DuoNeb twice daily.   Observations/Objective: 12/10/19 speaks in full sentences with no audible wheezing.  PFT March 2018 showed FEV1 86%, ratio 61, FVC 110% significant positive bronchodilator response DLCO 90%  Assessment and Plan: Acute asthma flare.  -Low suspicion for COVID-19.  Advised if symptoms develop with fever loss of taste or smell or productive cough with congestion or drainage to contact us for COVID-19 testing.  We will treat with short course of prednisone  Plan  Patient Instructions  Prednisone taper over next week .  Mucinex DM Twice daily  As needed  Cough/congestion  Tessalon Three times a day  As needed  Cough .  Continue on Symbicort 2 puffs Twice daily  , rinse after use.  May  Duoneb Three times a day.  Continue on Zyrtec 10mg  At bedtime  .  Follow up with Dr. Lamonte Sakai  Or  [name] Martinec NP in 4-6   weeks and As needed  Please contact office for sooner follow up if symptoms do not improve or worsen or seek emergency care        Follow Up Instructions: Follow up in 4-6 weeks and As needed   Please contact office for sooner follow up if symptoms do not improve or worsen or seek emergency care     I discussed the assessment and treatment plan with the patient. The patient was provided an opportunity to ask questions and all were answered. The patient agreed with the plan and demonstrated an understanding of the instructions.   The patient was advised to call back or seek an in-person evaluation if the symptoms worsen or if the condition fails to improve as anticipated.  I provided 23  minutes of non-face-to-face time during this encounter.   Rexene Edison, NP

## 2019-12-17 ENCOUNTER — Other Ambulatory Visit: Payer: Self-pay

## 2019-12-17 ENCOUNTER — Encounter (HOSPITAL_COMMUNITY): Payer: Self-pay | Admitting: Family Medicine

## 2019-12-17 ENCOUNTER — Emergency Department (HOSPITAL_COMMUNITY)
Admission: EM | Admit: 2019-12-17 | Discharge: 2019-12-18 | Disposition: A | Discharge status: Home / Self Care | Payer: Medicare Other | Attending: Emergency Medicine | Admitting: Emergency Medicine

## 2019-12-17 ENCOUNTER — Emergency Department (HOSPITAL_COMMUNITY): Payer: Medicare Other

## 2019-12-17 DIAGNOSIS — Z79899 Other long term (current) drug therapy: Secondary | ICD-10-CM | POA: Insufficient documentation

## 2019-12-17 DIAGNOSIS — E119 Type 2 diabetes mellitus without complications: Secondary | ICD-10-CM | POA: Insufficient documentation

## 2019-12-17 DIAGNOSIS — Z7982 Long term (current) use of aspirin: Secondary | ICD-10-CM | POA: Diagnosis not present

## 2019-12-17 DIAGNOSIS — Z87891 Personal history of nicotine dependence: Secondary | ICD-10-CM | POA: Insufficient documentation

## 2019-12-17 DIAGNOSIS — J45909 Unspecified asthma, uncomplicated: Secondary | ICD-10-CM | POA: Diagnosis present

## 2019-12-17 DIAGNOSIS — J4541 Moderate persistent asthma with (acute) exacerbation: Secondary | ICD-10-CM | POA: Diagnosis not present

## 2019-12-17 DIAGNOSIS — J45901 Unspecified asthma with (acute) exacerbation: Secondary | ICD-10-CM | POA: Insufficient documentation

## 2019-12-17 DIAGNOSIS — U071 COVID-19: Secondary | ICD-10-CM | POA: Diagnosis not present

## 2019-12-17 DIAGNOSIS — R0602 Shortness of breath: Secondary | ICD-10-CM | POA: Diagnosis not present

## 2019-12-17 LAB — CBC WITH DIFFERENTIAL/PLATELET
Abs Immature Granulocytes: 0.09 10*3/uL — ABNORMAL HIGH (ref 0.00–0.07)
Basophils Absolute: 0 10*3/uL (ref 0.0–0.1)
Basophils Relative: 1 %
Eosinophils Absolute: 0 10*3/uL (ref 0.0–0.5)
Eosinophils Relative: 1 %
HCT: 42.4 % (ref 36.0–46.0)
Hemoglobin: 13 g/dL (ref 12.0–15.0)
Immature Granulocytes: 2 %
Lymphocytes Relative: 22 %
Lymphs Abs: 1.2 10*3/uL (ref 0.7–4.0)
MCH: 27.7 pg (ref 26.0–34.0)
MCHC: 30.7 g/dL (ref 30.0–36.0)
MCV: 90.4 fL (ref 80.0–100.0)
Monocytes Absolute: 0.3 10*3/uL (ref 0.1–1.0)
Monocytes Relative: 5 %
Neutro Abs: 3.7 10*3/uL (ref 1.7–7.7)
Neutrophils Relative %: 69 %
Platelets: 319 10*3/uL (ref 150–400)
RBC: 4.69 MIL/uL (ref 3.87–5.11)
RDW: 19.8 % — ABNORMAL HIGH (ref 11.5–15.5)
WBC: 5.4 10*3/uL (ref 4.0–10.5)
nRBC: 0 % (ref 0.0–0.2)

## 2019-12-17 LAB — BASIC METABOLIC PANEL
Anion gap: 8 (ref 5–15)
BUN: 12 mg/dL (ref 8–23)
CO2: 23 mmol/L (ref 22–32)
Calcium: 8.7 mg/dL — ABNORMAL LOW (ref 8.9–10.3)
Chloride: 109 mmol/L (ref 98–111)
Creatinine, Ser: 0.78 mg/dL (ref 0.44–1.00)
GFR calc Af Amer: >60 mL/min (ref >60)
GFR calc non Af Amer: >60 mL/min (ref >60)
Glucose, Bld: 98 mg/dL (ref 70–99)
Potassium: 3 mmol/L — ABNORMAL LOW (ref 3.5–5.1)
Sodium: 140 mmol/L (ref 135–145)

## 2019-12-17 LAB — D-DIMER, QUANTITATIVE: D-Dimer, Quant: 0.65 ug/mL-FEU — ABNORMAL HIGH (ref 0.00–0.50)

## 2019-12-17 MED ORDER — IPRATROPIUM BROMIDE HFA 17 MCG/ACT IN AERS
2.0000 | INHALATION_SPRAY | Freq: Once | RESPIRATORY_TRACT | Status: AC
  Administered 2019-12-17: 2 via RESPIRATORY_TRACT
  Filled 2019-12-17: qty 12.9

## 2019-12-17 MED ORDER — ALBUTEROL SULFATE HFA 108 (90 BASE) MCG/ACT IN AERS
8.0000 | INHALATION_SPRAY | Freq: Once | RESPIRATORY_TRACT | Status: AC
  Administered 2019-12-17: 22:00:00 8 via RESPIRATORY_TRACT
  Filled 2019-12-17: qty 6.7

## 2019-12-17 MED ORDER — PREDNISONE 20 MG PO TABS
60.0000 mg | ORAL_TABLET | Freq: Once | ORAL | Status: AC
  Administered 2019-12-17: 21:00:00 60 mg via ORAL
  Filled 2019-12-17: qty 3

## 2019-12-17 MED ORDER — ALBUTEROL (5 MG/ML) CONTINUOUS INHALATION SOLN: 10.0000 mg/h | INHALATION_SOLUTION | Freq: Once | RESPIRATORY_TRACT | Status: DC

## 2019-12-17 NOTE — ED Notes (Addendum)
IV team at bedside 

## 2019-12-17 NOTE — ED Triage Notes (Signed)
Patient states she is having an asthma exacerbation. She had a telehealth appointment for her asthma symptoms and prescribed Prednisone and use inhalers. Patient's symptoms have continues to get worse.

## 2019-12-17 NOTE — ED Provider Notes (Signed)
[facility] DEPT Provider Note   CSN: [id number] Arrival date & time: 12/17/19  1856     History Chief Complaint  Patient presents with  . Asthma    [name] is a 74 y.o. female.  HPI She presents for evaluation of "asthma exacerbation.  She was seen by her pulmonary provider, by telehealth, and prescribed prednisone and inhalers.  Prednisone was prescribed as a taper, for 1 week, on 12/10/2019.  She is also using DuoNeb at home.  She has worsened despite this treatment.    Past Medical History:  Diagnosis Date  . Allergic rhinitis   . Anemia   . Asthma   . Chronic bronchitis ([place])   . Colonic polyp   . DDD (degenerative disc disease), lumbar 07/20/2011  . DJD (degenerative joint disease) of knee    bilateral  . DJD (degenerative joint disease), lumbar 07/20/2011  . Esophageal stricture   . GERD (gastroesophageal reflux disease)   . Hiatal hernia   . Hyperlipidemia   . Osteoporosis   . Stroke [employer])    "didn't know I'd had one before I was told; didn't do any damage" (11/24/2016)  . Vocal cord dysfunction     Patient Active Problem List   Diagnosis Date Noted  . On methotrexate therapy 07/10/2018  . Abnormal laboratory test 03/29/2018  . Sickle cell trait ([place]) 01/20/2017  . JAK-2 gene mutation 12/06/2016  . AKI (acute kidney injury) ([place]) 11/24/2016  . Right shoulder pain 11/24/2016  . Acute respiratory distress 11/24/2016  . RLS (restless legs syndrome) 11/24/2016  . Restless leg syndrome 10/13/2016  . Arthritis of knee, degenerative 02/18/2015  . Shingles 12/19/2014  . Lung collapse   . Atelectasis   . Essential thrombocythemia ([place]) 12/24/2013  . History of arthroscopy of knee 11/11/2013  . TIA (transient ischemic attack) 09/01/2012  . Chest pain 09/01/2012  . Arm numbness left 06/03/2012  . Migraine 05/31/2012  . Dyspnea on exertion 08/09/2011  . DDD (degenerative disc disease), lumbar 07/20/2011  . DJD (degenerative  joint disease), lumbar 07/20/2011  . CVA (cerebral infarction) 07/20/2011  . Acute lumbar radiculopathy 07/12/2011  . Preventative health care 07/10/2011  . TRIGGER FINGER, RIGHT MIDDLE 01/11/2011  . Other dysphagia 01/11/2011  . MENOPAUSAL DISORDER 07/05/2010  . OSTEOARTHRITIS, KNEES, BILATERAL 07/05/2010  . NEVUS 05/20/2010  . VAGINITIS 03/23/2010  . SUPERFICIAL THROMBOPHLEBITIS 03/20/2009  . FATIGUE 03/20/2009  . DYSPHAGIA UNSPECIFIED 02/16/2009  . Snoring 11/05/2008  . HERPES ZOSTER 06/30/2008  . Diabetes ([place]) 06/30/2008  . Hyperlipidemia 06/30/2008  . ESOPHAGEAL STRICTURE 06/30/2008  . FOOT PAIN, RIGHT 06/30/2008  . OSTEOPOROSIS 06/30/2008  . COLONIC POLYPS, HX OF 06/30/2008  . PANIC ATTACK 09/05/2007  . Allergic rhinitis 09/05/2007  . Vocal cord dysfunction 09/05/2007  . Intrinsic asthma 09/05/2007  . Esophageal reflux 09/05/2007    Past Surgical History:  Procedure Laterality Date  . CARPAL TUNNEL RELEASE Bilateral   . COLONOSCOPY W/ BIOPSIES AND POLYPECTOMY    . ESOPHAGOGASTRODUODENOSCOPY (EGD) WITH ESOPHAGEAL DILATION    . FOOT SURGERY Bilateral    "might have been bunions or hammertoes"  . KNEE ARTHROSCOPY Left 11/06/2013  . TOTAL ABDOMINAL HYSTERECTOMY    . VIDEO BRONCHOSCOPY Bilateral 12/12/2014   Procedure: VIDEO BRONCHOSCOPY WITHOUT FLUORO;  Surgeon: Chesley Mires, MD;  Location: [facility] ENDOSCOPY;  Service: Cardiopulmonary;  Laterality: Bilateral;     OB History   No obstetric history on file.     Family History  Problem Relation Age of Onset  .  Heart attack Mother   . Colon cancer Father 47  . Cancer Sister        unknown type  . Asthma Brother   . Asthma Other        uncle  . Stomach cancer Neg Hx   . Esophageal cancer Neg Hx   . Rectal cancer Neg Hx     Social History   Tobacco Use  . Smoking status: Former Smoker    Packs/day: 0.10    Years: 30.00    Pack years: 3.00    Types: Cigarettes    Start date: 01/31/1975    Quit date: 08/09/1995     Years since quitting: 24.3  . Smokeless tobacco: Never Used  Substance Use Topics  . Alcohol use: No    Alcohol/week: 0.0 standard drinks  . Drug use: No    Home Medications Prior to Admission medications   Medication Sig Start Date End Date Taking? Authorizing Provider  albuterol (PROVENTIL HFA) 108 (90 Base) MCG/ACT inhaler Inhale 2 puffs into the lungs every 4 (four) hours as needed for wheezing or shortness of breath. Reported on 12/30/2015 03/04/16  Yes Collene Gobble, MD  albuterol (PROVENTIL) (2.5 MG/3ML) 0.083% nebulizer solution Take 2.5 mg by nebulization every 4 (four) hours as needed for shortness of breath. Reported on 12/30/2015 09/11/13  Yes Collene Gobble, MD  aspirin EC 81 MG tablet Take 81 mg by mouth daily.   Yes [provider]  benzonatate (TESSALON) 200 MG capsule Take 1 capsule (200 mg total) by mouth 3 (three) times daily as needed for cough. 12/10/19  Yes Parrett, Tammy S, NP  budesonide-formoterol (SYMBICORT) 160-4.5 MCG/ACT inhaler Inhale 2 puffs into the lungs 2 (two) times daily. 09/16/19  Yes Collene Gobble, MD  cholecalciferol (VITAMIN D) 1000 UNITS tablet Take 1,000 Units by mouth daily.   Yes [provider]  clonazePAM (KLONOPIN) 0.5 MG tablet TAKE 1 TABLET BY MOUTH TWICE DAILY AS NEEDED FOR ANXIETY 04/22/15  Yes [provider]  fluticasone (FLONASE) 50 MCG/ACT nasal spray Place 1 spray into the nose 2 (two) times daily.    Yes [provider]  folic acid (FOLVITE) 1 MG tablet Take 1 tablet (1 mg total) by mouth daily. 06/03/15  Yes [place], Holli Humbles, NP  hydroxyurea (HYDREA) 500 MG capsule Take 1 capsule (500 mg total) by mouth 3 (three) times daily before meals. 03/02/18  Yes Ennever, Rudell Cobb, MD  Magnesium 200 MG TABS Take 1 tablet by mouth 2 (two) times daily.    Yes [provider]  montelukast (SINGULAIR) 10 MG tablet Take 1 tablet (10 mg total) by mouth at bedtime. 04/01/19  Yes Fenton Foy, NP  omeprazole  (PRILOSEC) 20 MG capsule TAKE 1 TABLET TWICE A DAY. 09/08/17  Yes Collene Gobble, MD  pravastatin (PRAVACHOL) 40 MG tablet Take 40 mg by mouth daily.   Yes [provider]  rOPINIRole (REQUIP) 1 MG tablet Take 1.5 mg by mouth at bedtime as needed (restless legs).    Yes [provider]  sertraline (ZOLOFT) 50 MG tablet Take 50 mg by mouth daily.   Yes [provider]  ipratropium-albuterol (DUONEB) 0.5-2.5 (3) MG/3ML SOLN Take 3 mLs by nebulization 3 (three) times daily. Patient not taking: Reported on 12/10/2019 05/13/19   Parrett, Fonnie Mu, NP  predniSONE (DELTASONE) 10 MG tablet Take q day 6,5,4,3,2,1 12/18/19   Daleen Bo, MD    Allergies    Penicillins  Review of  Systems   Review of Systems  Physical Exam Updated Vital Signs BP 101/78   Pulse 73   Temp 98.6 F (37 C) (Oral)   Resp 17   Ht 5\' 2"  (1.575 m)   Wt 80.7 kg   SpO2 95%   BMI 32.56 kg/m   Physical Exam  ED Results / Procedures / Treatments   Labs (all labs ordered are listed, but only abnormal results are displayed) Labs Reviewed  BASIC METABOLIC PANEL - Abnormal; Notable for the following components:      Result Value   Potassium 3.0 (*)    Calcium 8.7 (*)    All other components within normal limits  CBC WITH DIFFERENTIAL/PLATELET - Abnormal; Notable for the following components:   RDW 19.8 (*)    Abs Immature Granulocytes 0.09 (*)    All other components within normal limits  D-DIMER, QUANTITATIVE (NOT AT [facility]) - Abnormal; Notable for the following components:   D-Dimer, Quant 0.65 (*)    All other components within normal limits  RESPIRATORY PANEL BY RT PCR (FLU A&B, COVID)    EKG None  Radiology DG Chest Port 1 View  Result Date: 12/17/2019 CLINICAL DATA:  Asthma, shortness of breath EXAM: PORTABLE CHEST 1 VIEW COMPARISON:  06/03/2011 FINDINGS: Heart and mediastinal contours are within normal limits. No focal opacities or effusions. No acute bony abnormality. Aortic  atherosclerosis. IMPRESSION: No active disease. Electronically Signed   By: Rolm Baptise M.D.   On: 12/17/2019 21:00    Procedures Procedures (including critical care time)  Medications Ordered in ED Medications  albuterol (PROVENTIL,VENTOLIN) solution continuous neb (has no administration in time range)  predniSONE (DELTASONE) tablet 60 mg (60 mg Oral Given 12/17/19 2123)  albuterol (VENTOLIN HFA) 108 (90 Base) MCG/ACT inhaler 8 puff (8 puffs Inhalation Given 12/17/19 2209)  ipratropium (ATROVENT HFA) inhaler 2 puff (2 puffs Inhalation Given 12/17/19 2209)    ED Course  I have reviewed the triage vital signs and the nursing notes.  Pertinent labs & imaging results that were available during my care of the patient were reviewed by me and considered in my medical decision making (see chart for details).  Clinical Course as of Dec 17 32  Wed Dec 18, 2019  0015 Normal except potassium low  Basic metabolic panel(!) [EW]  123456 Normal age-adjusted  D-dimer, quantitative(!) [EW]  0015 Normal  CBC with Differential(!) [EW]  0015 No infiltrate or CHF, interpreted by me  DG Chest [facility] 1 View [EW]    Clinical Course User Index [EW] Daleen Bo, MD   MDM Rules/Calculators/A&P                       Patient Vitals for the past 24 hrs:  BP Temp Temp src Pulse Resp SpO2 Height Weight  12/18/19 0017 -- -- -- 73 -- 95 % -- --  12/17/19 2209 -- -- -- -- -- 98 % -- --  12/17/19 2100 101/78 -- -- 69 17 99 % -- --  12/17/19 2000 112/69 -- -- 71 -- 95 % -- --  12/17/19 1926 119/80 98.6 F (37 C) Oral 87 20 100 % -- --  12/17/19 1923 -- -- -- -- -- -- 5\' 2"  (1.575 m) 80.7 kg    12:34 AM Reevaluation with update and discussion. After initial assessment and treatment, an updated evaluation reveals at this time she states she feels better and is well enough to go home.  She does not want to  wait for the Covid testing results return.  She states she has plenty of medicines at home to take.   Findings discussed and questions answered. Daleen Bo   Medical Decision Making: COPD exacerbation, without signs or symptoms of serious cardiac problem abnormalities.  She is stable for discharge with outpatient management.  Will restart prednisone to treat for the inflammatory process.  Doubt Covid infection, this was ordered simply to be able to give her nebulizer which she ultimately did not need.  CRITICAL CARE-no Performed by: Daleen Bo   Nursing Notes Reviewed/ Care Coordinated Applicable Imaging Reviewed Interpretation of Laboratory Data incorporated into ED treatment  The patient appears reasonably screened and/or stabilized for discharge and I doubt any other medical condition or other [facility] requiring further screening, evaluation, or treatment in the ED at this time prior to discharge.  Plan: Home Medications-continue usual medications; Home Treatments-rest, gradual advance diet and activity; return here if the recommended treatment, does not improve the symptoms; Recommended follow up-pulmonary follow-up 1 week and as needed    Final Clinical Impression(s) / ED Diagnoses Final diagnoses:  Moderate asthma with exacerbation, unspecified whether persistent    Rx / DC Orders ED Discharge Orders         Ordered    predniSONE (DELTASONE) 10 MG tablet     12/18/19 0030           Daleen Bo, MD 12/18/19 [phone]

## 2019-12-18 ENCOUNTER — Telehealth (HOSPITAL_COMMUNITY): Payer: Self-pay

## 2019-12-18 LAB — RESPIRATORY PANEL BY RT PCR (FLU A&B, COVID)
Influenza A by PCR: NEGATIVE
Influenza B by PCR: NEGATIVE
SARS Coronavirus 2 by RT PCR: POSITIVE — AB

## 2019-12-18 MED ORDER — PREDNISONE 10 MG PO TABS: ORAL_TABLET | ORAL | 0 refills | Status: DC

## 2019-12-18 NOTE — Discharge Instructions (Addendum)
Continue taking your usual medicines at home.  Your pulmonary doctor for a follow-up appointment in 10 days or so.  Use your DuoNeb at least 3 times a day as needed for trouble breathing.  Make sure you are drinking plenty of water.

## 2019-12-19 ENCOUNTER — Telehealth: Payer: Self-pay | Admitting: Emergency Medicine

## 2019-12-19 NOTE — Telephone Encounter (Signed)
Dr. Lamonte Sakai, See patient message as to her recent diagnosis.

## 2019-12-19 NOTE — Telephone Encounter (Signed)
Thank you :)

## 2020-01-07 ENCOUNTER — Ambulatory Visit: Payer: Medicare Other | Admitting: Adult Health

## 2020-01-07 ENCOUNTER — Ambulatory Visit: Payer: Medicare Other | Admitting: Emergency Medicine

## 2020-01-08 ENCOUNTER — Other Ambulatory Visit: Payer: Self-pay

## 2020-01-08 ENCOUNTER — Encounter: Payer: Self-pay | Admitting: Emergency Medicine

## 2020-01-08 ENCOUNTER — Ambulatory Visit (INDEPENDENT_AMBULATORY_CARE_PROVIDER_SITE_OTHER): Payer: Medicare Other | Admitting: Emergency Medicine

## 2020-01-08 DIAGNOSIS — J45909 Unspecified asthma, uncomplicated: Secondary | ICD-10-CM

## 2020-01-08 MED ORDER — ALBUTEROL SULFATE (2.5 MG/3ML) 0.083% IN NEBU: 2.5000 mg | INHALATION_SOLUTION | RESPIRATORY_TRACT | 12 refills | Status: DC | PRN

## 2020-01-08 MED ORDER — BUDESONIDE-FORMOTEROL FUMARATE 160-4.5 MCG/ACT IN AERO: 2.0000 | INHALATION_SPRAY | Freq: Two times a day (BID) | RESPIRATORY_TRACT | 5 refills | Status: DC

## 2020-01-08 NOTE — Progress Notes (Signed)
Virtual Visit via Telephone Note  I connected with [name] on 01/08/20 at  9:00 AM EST by telephone and verified that I am speaking with the correct person using two identifiers.  Location: Patient: Home Provider: Office   I discussed the limitations, risks, security and privacy concerns of performing an evaluation and management service by telephone and the availability of in person appointments. I also discussed with the patient that there may be a patient responsible charge related to this service. The patient expressed understanding and agreed to proceed.   History of Present Illness: [name] follows up today for complex obstructive lung disease that includes significant upper airway obstruction with VCD, also mild to moderate persistent asthma.  She has autoimmune disease on methotrexate, suspected rheumatoid arthritis.  Underlying contributors to her asthma and VCD include GERD, allergic rhinitis.   Observations/Objective: She has been managed on Symbicort, changed to [facility] back in October for insurance/cost reasons.  Remains on Singulair, fluticasone nasal spray, omeprazole.  She was treated for an acute flare in early January, subsequently was Covid tested and positive on 1/12. She tells me that she is having persistent exertional dyspnea, has had improvement in her wheezing. She does not believe the [facility] helps her dyspnea more that the symbicort  Assessment and Plan: She never really had significant constitutional symptoms from her COVID-19 infection, did have increased wheezing which is improving.  Her progressive dyspnea goes back farther than that and she wonders whether it relates to the change from Symbicort to [facility].  Certainly this is possible.  I want to avoid powdered inhalers if possible.  I will switch her back to generic Symbicort, see if she has an improvement in her dyspnea.  If not may need to evaluate other contributors, check for hypoxemia, weight gain,  pulmonary hypertension, changes due to MTX, etc. She will continue her other maintenance medicines  Follow Up Instructions: 1 month office visit after she has been on Symbicort to see if she benefited   I discussed the assessment and treatment plan with the patient. The patient was provided an opportunity to ask questions and all were answered. The patient agreed with the plan and demonstrated an understanding of the instructions.   The patient was advised to call back or seek an in-person evaluation if the symptoms worsen or if the condition fails to improve as anticipated.  I provided 15 minutes of non-face-to-face time during this encounter.   Collene Gobble, MD

## 2020-01-23 ENCOUNTER — Ambulatory Visit: Payer: Medicare Other

## 2020-01-27 ENCOUNTER — Ambulatory Visit: Payer: Medicare Other | Attending: Family

## 2020-01-27 DIAGNOSIS — Z23 Encounter for immunization: Secondary | ICD-10-CM | POA: Insufficient documentation

## 2020-01-27 NOTE — Progress Notes (Signed)
   Covid-19 Vaccination Clinic  Name:  [name]    MRN: [id number] DOB: 1946/07/23  01/27/2020  [name] was observed post Covid-19 immunization for 15 minutes without incidence. She was provided with Vaccine Information Sheet and instruction to access the V-Safe system.   [name] was instructed to call 911 with any severe reactions post vaccine: Marland Kitchen Difficulty breathing  . Swelling of your face and throat  . A fast heartbeat  . A bad rash all over your body  . Dizziness and weakness    Immunizations Administered    Name Date Dose VIS Date Route   Moderna COVID-19 Vaccine 01/27/2020 10:38 AM 0.5 mL 11/05/2019 Intramuscular   Manufacturer: Moderna   Lot: [id number]   [id number]

## 2020-02-10 ENCOUNTER — Ambulatory Visit: Payer: Medicare Other | Admitting: Emergency Medicine

## 2020-02-10 ENCOUNTER — Other Ambulatory Visit: Payer: Self-pay

## 2020-02-10 ENCOUNTER — Ambulatory Visit (INDEPENDENT_AMBULATORY_CARE_PROVIDER_SITE_OTHER): Payer: Medicare Other

## 2020-02-10 ENCOUNTER — Encounter: Payer: Self-pay | Admitting: Emergency Medicine

## 2020-02-10 DIAGNOSIS — R0600 Dyspnea, unspecified: Secondary | ICD-10-CM | POA: Diagnosis not present

## 2020-02-10 DIAGNOSIS — J849 Interstitial pulmonary disease, unspecified: Secondary | ICD-10-CM | POA: Diagnosis not present

## 2020-02-10 DIAGNOSIS — R0609 Other forms of dyspnea: Secondary | ICD-10-CM

## 2020-02-10 DIAGNOSIS — R918 Other nonspecific abnormal finding of lung field: Secondary | ICD-10-CM | POA: Diagnosis not present

## 2020-02-10 NOTE — Assessment & Plan Note (Addendum)
Increased dyspnea.  She initially described this to the change from Symbicort to [facility]  Need to determine whether there could be other causes including possible RA, methotrexate related ILD.  Consider also Covid related changes (although her dyspnea preceded that illness).  She needs repeat pulmonary function testing, chest imaging including high-resolution CT chest.  I will try to get her back on Symbicort although we do not have any samples here today.  She may merit an escalation to LABA/LAMA.  Please continue Dulera 2 puffs twice a day for now.  Rinse and gargle after using.  We will consider change back to Symbicort or possibly another alternative since you have lost ground on this medication.  We do not have samples of Symbicort right now.  We will try to call you if any arrive so that we can try making that change. Keep albuterol available use 2 puffs if needed for shortness of breath, chest tightness, wheezing. We will repeat your pulmonary function testing at your next office visit We will perform a chest x-ray today for shortness of breath We will perform a high-resolution CT scan of your chest to evaluate for interstitial lung disease Follow-up with Dr. Lamonte Sakai next available after your testing so that we can discuss the results

## 2020-02-10 NOTE — Patient Instructions (Addendum)
Please continue Dulera 2 puffs twice a day for now.  Rinse and gargle after using.  We will consider change back to Symbicort or possibly another alternative since you have lost ground on this medication.  We do not have samples of Symbicort right now.  We will try to call you if any arrive so that we can try making that change. Keep albuterol available use 2 puffs if needed for shortness of breath, chest tightness, wheezing. We will repeat your pulmonary function testing at your next office visit We will perform a chest x-ray today for shortness of breath We will perform a high-resolution CT scan of your chest to evaluate for interstitial lung disease Continue your Flonase nasal spray as you have been taking it Continue omeprazole as you have been taking it. Follow-up with Dr. Lamonte Sakai next available after your testing so that we can discuss the results

## 2020-02-10 NOTE — Progress Notes (Signed)
Subjective:    Patient ID: [name], female    DOB: 1946-03-12   MRN: [id number] HPI  ROV 09/16/2019 --follow-up visit for [name].  She is 74 with a history of autoimmune disease, suspected RA currently on methotrexate.  She has vocal cord dysfunction with chronic upper airway irritation and cough superimposed on mild to moderate persistent asthma.  She has GERD and allergic rhinitis which can cause flares.  Currently managed on Symbicort, Singulair.  Uses fluticasone nasal spray, omeprazole.  Her symptoms have been a bit more labile, she had some episodes of wheeze that woke her from sleep - she used her nebulized albuterol with some relief. She has also had a bit more cough. Her voice has remained strong. Some throat clearing. Overall fairly good control. She is using albuterol more frequently last week - bid.   ROV 02/10/2020 --74 year old woman follows up today for her history of upper airway disease, chronic cough and moderate persistent asthma.  She also has autoimmune disease (suspected RA) on methotrexate.  She had COVID-19 in January.  A month ago we reviewed her symptoms and she continued to have exertional dyspnea.  She noted that she had been changed from Symbicort to [facility], question whether this may have been partly responsible.  We tried changing back to Symbicort 1 month ago but the cost was too high - she is still on the [facility] but remains dyspneic w any exertion. She cannot do chores at home, etc. Denies much stridor, occasional wheeze.   Prior PFT, imaging, hospital notes from ER January 2021 reviewed.  ROS:  Cough, dyspnea. Nocturnal awakenings - required albuterol Moderate to severe exercise limitation   Objective:   Physical Exam Vitals:   02/10/20 0935  BP: 98/60  Pulse: 93  Temp: (!) 97.3 F (36.3 C)  TempSrc: Temporal  SpO2: 100%  Weight: 170 lb 12.8 oz (77.5 kg)  Height: 5\' 2"  (1.575 m)   Gen: Pleasant, overwt, in no distress,  normal affect  ENT: No  lesions,  mouth clear,  oropharynx clear, no postnasal drip, strong voice  Neck: supple, soft inspiratory and expiratory stridor present  Lungs : distant, referred upper airway noise present, possible superimposed bilateral end expiratory wheezing  Cardiovascular: RRR, heart sounds normal, no murmur or gallops, no peripheral edema  Musculoskeletal: No deformities, no cyanosis or clubbing  Neuro: alert, non focal  Skin: Warm, no rash    Assessment & Plan:   Dyspnea on exertion Increased dyspnea.  She initially described this to the change from Symbicort to [facility].  Need to determine whether there could be other causes including possible RA, methotrexate related ILD.  Consider also Covid related changes (although her dyspnea preceded that illness).  She needs repeat pulmonary function testing, chest imaging including high-resolution CT chest.  I will try to get her back on Symbicort although we do not have any samples here today.  She may merit an escalation to LABA/LAMA.  Please continue Dulera 2 puffs twice a day for now.  Rinse and gargle after using.  We will consider change back to Symbicort or possibly another alternative since you have lost ground on this medication.  We do not have samples of Symbicort right now.  We will try to call you if any arrive so that we can try making that change. Keep albuterol available use 2 puffs if needed for shortness of breath, chest tightness, wheezing. We will repeat your pulmonary function testing at your next office visit We will  perform a chest x-ray today for shortness of breath We will perform a high-resolution CT scan of your chest to evaluate for interstitial lung disease Follow-up with Dr. Lamonte Sakai next available after your testing so that we can discuss the results  Vocal cord dysfunction Continue your Flonase nasal spray as you have been taking it Continue omeprazole as you have been taking it.  Baltazar Apo, MD, PhD 02/10/2020, 10:00  AM [facility] [phone] or if no answer [phone]

## 2020-02-10 NOTE — Assessment & Plan Note (Signed)
Continue your Flonase nasal spray as you have been taking it Continue omeprazole as you have been taking it.

## 2020-02-18 ENCOUNTER — Other Ambulatory Visit: Payer: Self-pay

## 2020-02-18 ENCOUNTER — Ambulatory Visit (HOSPITAL_COMMUNITY)
Admission: RE | Admit: 2020-02-18 | Discharge: 2020-02-18 | Disposition: A | Discharge status: Home / Self Care | Payer: Medicare Other | Source: Ambulatory Visit | Attending: Emergency Medicine | Admitting: Emergency Medicine

## 2020-02-18 DIAGNOSIS — J849 Interstitial pulmonary disease, unspecified: Secondary | ICD-10-CM

## 2020-02-18 DIAGNOSIS — R0602 Shortness of breath: Secondary | ICD-10-CM | POA: Diagnosis not present

## 2020-02-25 ENCOUNTER — Ambulatory Visit: Payer: Medicare Other | Attending: Family

## 2020-02-25 DIAGNOSIS — Z23 Encounter for immunization: Secondary | ICD-10-CM

## 2020-02-25 NOTE — Progress Notes (Signed)
   Covid-19 Vaccination Clinic  Name:  [name]    MRN: [id number] DOB: 1946-08-10  02/25/2020  Ms. Dial was observed post Covid-19 immunization for 15 minutes without incident. She was provided with Vaccine Information Sheet and instruction to access the V-Safe system.   [name] was instructed to call 911 with any severe reactions post vaccine: Marland Kitchen Difficulty breathing  . Swelling of face and throat  . A fast heartbeat  . A bad rash all over body  . Dizziness and weakness   Immunizations Administered    Name Date Dose VIS Date Route   Moderna COVID-19 Vaccine 02/25/2020 12:52 PM 0.5 mL 11/05/2019 Intramuscular   Manufacturer: Moderna   LotMV:[id number]   [id number]

## 2020-02-26 ENCOUNTER — Other Ambulatory Visit: Payer: Self-pay | Admitting: Nurse Practitioner

## 2020-02-26 DIAGNOSIS — J45909 Unspecified asthma, uncomplicated: Secondary | ICD-10-CM

## 2020-02-26 DIAGNOSIS — J209 Acute bronchitis, unspecified: Secondary | ICD-10-CM

## 2020-03-11 DIAGNOSIS — Z0000 Encounter for general adult medical examination without abnormal findings: Secondary | ICD-10-CM | POA: Diagnosis not present

## 2020-03-11 DIAGNOSIS — M059 Rheumatoid arthritis with rheumatoid factor, unspecified: Secondary | ICD-10-CM | POA: Diagnosis not present

## 2020-03-11 DIAGNOSIS — Z79899 Other long term (current) drug therapy: Secondary | ICD-10-CM | POA: Diagnosis not present

## 2020-03-11 DIAGNOSIS — E782 Mixed hyperlipidemia: Secondary | ICD-10-CM | POA: Diagnosis not present

## 2020-03-11 DIAGNOSIS — D473 Essential (hemorrhagic) thrombocythemia: Secondary | ICD-10-CM | POA: Diagnosis not present

## 2020-03-19 ENCOUNTER — Inpatient Hospital Stay (HOSPITAL_BASED_OUTPATIENT_CLINIC_OR_DEPARTMENT_OTHER): Payer: Medicare Other | Admitting: Hematology & Oncology

## 2020-03-19 ENCOUNTER — Other Ambulatory Visit: Payer: Self-pay

## 2020-03-19 ENCOUNTER — Encounter: Payer: Self-pay | Admitting: Hematology & Oncology

## 2020-03-19 ENCOUNTER — Inpatient Hospital Stay: Payer: Medicare Other | Attending: Hematology & Oncology

## 2020-03-19 VITALS — BP 117/62 | HR 92 | Temp 97.1°F | Resp 80 | Wt 166.0 lb

## 2020-03-19 DIAGNOSIS — D473 Essential (hemorrhagic) thrombocythemia: Secondary | ICD-10-CM | POA: Insufficient documentation

## 2020-03-19 DIAGNOSIS — D573 Sickle-cell trait: Secondary | ICD-10-CM | POA: Diagnosis not present

## 2020-03-19 DIAGNOSIS — Z79899 Other long term (current) drug therapy: Secondary | ICD-10-CM | POA: Insufficient documentation

## 2020-03-19 DIAGNOSIS — Z7982 Long term (current) use of aspirin: Secondary | ICD-10-CM | POA: Diagnosis not present

## 2020-03-19 LAB — CMP (CANCER CENTER ONLY)
ALT: 16 U/L (ref 0–44)
AST: 21 U/L (ref 15–41)
Albumin: 3.8 g/dL (ref 3.5–5.0)
Alkaline Phosphatase: 64 U/L (ref 38–126)
Anion gap: 11 (ref 5–15)
BUN: 8 mg/dL (ref 8–23)
CO2: 21 mmol/L — ABNORMAL LOW (ref 22–32)
Calcium: 9.3 mg/dL (ref 8.9–10.3)
Chloride: 106 mmol/L (ref 98–111)
Creatinine: 0.8 mg/dL (ref 0.44–1.00)
GFR, Est AFR Am: >60 mL/min (ref >60)
GFR, Estimated: >60 mL/min (ref >60)
Glucose, Bld: 166 mg/dL — ABNORMAL HIGH (ref 70–99)
Potassium: 3.5 mmol/L (ref 3.5–5.1)
Sodium: 138 mmol/L (ref 135–145)
Total Bilirubin: 0.6 mg/dL (ref 0.3–1.2)
Total Protein: 6.8 g/dL (ref 6.5–8.1)

## 2020-03-19 LAB — CBC WITH DIFFERENTIAL (CANCER CENTER ONLY)
Abs Immature Granulocytes: 0.03 10*3/uL (ref 0.00–0.07)
Basophils Absolute: 0 10*3/uL (ref 0.0–0.1)
Basophils Relative: 0 %
Eosinophils Absolute: 0.2 10*3/uL (ref 0.0–0.5)
Eosinophils Relative: 2 %
HCT: 35.8 % — ABNORMAL LOW (ref 36.0–46.0)
Hemoglobin: 11.4 g/dL — ABNORMAL LOW (ref 12.0–15.0)
Immature Granulocytes: 0 %
Lymphocytes Relative: 26 %
Lymphs Abs: 1.7 10*3/uL (ref 0.7–4.0)
MCH: 27.9 pg (ref 26.0–34.0)
MCHC: 31.8 g/dL (ref 30.0–36.0)
MCV: 87.7 fL (ref 80.0–100.0)
Monocytes Absolute: 0.4 10*3/uL (ref 0.1–1.0)
Monocytes Relative: 7 %
Neutro Abs: 4.4 10*3/uL (ref 1.7–7.7)
Neutrophils Relative %: 65 %
Platelet Count: 533 10*3/uL — ABNORMAL HIGH (ref 150–400)
RBC: 4.08 MIL/uL (ref 3.87–5.11)
RDW: 18.4 % — ABNORMAL HIGH (ref 11.5–15.5)
WBC Count: 6.8 10*3/uL (ref 4.0–10.5)
nRBC: 0 % (ref 0.0–0.2)

## 2020-03-19 LAB — SAVE SMEAR(SSMR), FOR PROVIDER SLIDE REVIEW

## 2020-03-19 NOTE — Progress Notes (Signed)
Hematology and Oncology Follow Up Visit  [name] [id number] 18-Jul-1946 74 y.o. 03/19/2020   Principle Diagnosis:  Essential thrombocythemia - JAK2 POSITIVE Sickle cell trait  Current Therapy:   Hydrea 500 mg PO TID - dose changed on [id number] Folic acid 1 mg by mouth daily Aspirin 81 mg daily    Interim History:  [name] is here today for follow-up.  Shockingly, it has been about 9 months since we last saw [name].  [name] was having issues with the family.  Daughter of hers had a stroke.  [name] had to take care of [name] daughter.  [name] also had take care of some grandchildren and great-grandchildren.  The real problem that [name] has sounds like [name] lungs.  [name] has having problems with breathing.  [name] subsequently had a CT scan done on 02/18/2019 one of the chest.  This shows that [name] probably has some interstitial lung disease.  It looks like this might be usual interstitial pneumonia.  There is no adenopathy.  [name] does have some atherosclerotic disease.  I think [name] sees the pulmonologist and in a couple weeks.  [name] has had no bleeding.  Is been no change in bowel or bladder habits.  [name] has had no nausea or vomiting.  [name] has had no fever.  [name] was told that [name] had the coronavirus back in January.  [name] was not hospitalized for this.  [name] just quarantined herself for 14 days.  [name] is on Hydrea 3 times a day.  [name] platelet count is up a little bit more today.  Some of this might be because [name] is on prednisone.  As such I am not going to change the Hydrea dose.  Overall, [name] performance status is ECOG 1.  Medications:  Allergies as of 03/19/2020      Reactions   Penicillins Itching, Rash      Medication List       Accurate as of March 19, 2020  2:01 PM. If you have any questions, ask your nurse or doctor.        STOP taking these medications   rOPINIRole 1 MG tablet Commonly known as: REQUIP Stopped by: [name], MD   sertraline 50 MG tablet Commonly known as:  ZOLOFT Stopped by: [name], MD     TAKE these medications   albuterol 108 (90 Base) MCG/ACT inhaler Commonly known as: Proventil HFA Inhale 2 puffs into the lungs every 4 (four) hours as needed for wheezing or shortness of breath. Reported on 12/30/2015   albuterol (2.5 MG/3ML) 0.083% nebulizer solution Commonly known as: PROVENTIL Take 3 mLs (2.5 mg total) by nebulization every 4 (four) hours as needed for shortness of breath. Reported on 12/30/2015   aspirin EC 81 MG tablet Take 81 mg by mouth daily.   benzonatate 200 MG capsule Commonly known as: TESSALON Take 1 capsule (200 mg total) by mouth 3 (three) times daily as needed for cough.   budesonide-formoterol 160-4.5 MCG/ACT inhaler Commonly known as: SYMBICORT Inhale 2 puffs into the lungs 2 (two) times daily.   cholecalciferol 1000 units tablet Commonly known as: VITAMIN D Take 1,000 Units by mouth daily.   clonazePAM 0.5 MG tablet Commonly known as: KLONOPIN TAKE 1 TABLET BY MOUTH TWICE DAILY AS NEEDED FOR ANXIETY   Dulera 200-5 MCG/ACT Aero Generic drug: mometasone-formoterol Inhale 2 puffs into the lungs 2 (two) times daily.   fluticasone 50 MCG/ACT nasal spray Commonly known as: FLONASE Place 1 spray into the  nose 2 (two) times daily.   folic acid 1 MG tablet Commonly known as: FOLVITE Take 1 tablet (1 mg total) by mouth daily.   hydroxyurea 500 MG capsule Commonly known as: HYDREA Take 1 capsule (500 mg total) by mouth 3 (three) times daily before meals.   Magnesium 200 MG Tabs Take 1 tablet by mouth 2 (two) times daily.   montelukast 10 MG tablet Commonly known as: SINGULAIR TAKE 1 TABLET BY MOUTH EVERYDAY AT BEDTIME   omeprazole 20 MG capsule Commonly known as: PRILOSEC TAKE 1 TABLET TWICE A DAY.   pravastatin 40 MG tablet Commonly known as: PRAVACHOL Take 40 mg by mouth daily.       Allergies:  Allergies  Allergen Reactions  . Penicillins Itching and Rash    Past Medical  History, Surgical history, Social history, and Family History were reviewed and updated.  Review of Systems:    Physical Exam:  weight is 166 lb (75.3 kg). [name] temporal temperature is 97.1 F (36.2 C) (abnormal). [name] blood pressure is 117/62 and [name] pulse is 92. [name] respiration is 80 (abnormal) and oxygen saturation is 100%.   Wt Readings from Last 3 Encounters:  03/19/20 166 lb (75.3 kg)  02/10/20 170 lb 12.8 oz (77.5 kg)  12/17/19 178 lb (80.7 kg)       Lab Results  Component Value Date   WBC 6.8 03/19/2020   HGB 11.4 (L) 03/19/2020   HCT 35.8 (L) 03/19/2020   MCV 87.7 03/19/2020   PLT 533 (H) 03/19/2020   Lab Results  Component Value Date   FERRITIN 162 06/20/2019   IRON 128 06/20/2019   TIBC 266 06/20/2019   UIBC 138 06/20/2019   IRONPCTSAT 48 06/20/2019   Lab Results  Component Value Date   RETICCTPCT 0.8 06/20/2019   RBC 4.08 03/19/2020   RETICCTABS 73.1 08/06/2015   No results found for: KPAFRELGTCHN, LAMBDASER, KAPLAMBRATIO No results found for: IGGSERUM, IGA, IGMSERUM No results found for: Kathrynn Ducking, MSPIKE, SPEI   Chemistry      Component Value Date/Time   NA 138 03/19/2020 1311   NA 145 11/24/2017 0754   NA 142 05/25/2017 0755   K 3.5 03/19/2020 1311   K 3.7 11/24/2017 0754   K 3.9 05/25/2017 0755   CL 106 03/19/2020 1311   CL 104 11/24/2017 0754   CO2 21 (L) 03/19/2020 1311   CO2 27 11/24/2017 0754   CO2 24 05/25/2017 0755   BUN 8 03/19/2020 1311   BUN 12 11/24/2017 0754   BUN 13.3 05/25/2017 0755   CREATININE 0.80 03/19/2020 1311   CREATININE 1.0 11/24/2017 0754   CREATININE 0.8 05/25/2017 0755      Component Value Date/Time   CALCIUM 9.3 03/19/2020 1311   CALCIUM 9.1 11/24/2017 0754   CALCIUM 9.5 05/25/2017 0755   ALKPHOS 64 03/19/2020 1311   ALKPHOS 70 11/24/2017 0754   ALKPHOS 74 05/25/2017 0755   AST 21 03/19/2020 1311   AST 21 05/25/2017 0755   ALT 16 03/19/2020 1311   ALT 18  11/24/2017 0754   ALT 14 05/25/2017 0755   BILITOT 0.6 03/19/2020 1311   BILITOT 0.37 05/25/2017 0755       Impression and Plan: [name] is a very pleasant 74 yo African American female with essential thrombocythemia, that is JAK2 positive.  In addition, [name] has the sickle cell trait.  I will not adjust the dose of Hydrea for right now.  I hope  that this lung issue is not going to be a problem for [name] long-term.  I know the pulmonary medicine will do a great job in trying to improve [name] pulmonary status.  I am glad that [name] daughter seems to be doing a little bit better now.  Clearly, [name] has been doing quite a [name].  [name] has been quite busy with [name] family.  [name] has always had [name] priority to help [name] family.  We will plan to get [name] back in about 2 months or so.    [name], MD 4/15/20212:01 PM

## 2020-03-20 LAB — FERRITIN: Ferritin: 236 ng/mL (ref 11–307)

## 2020-03-20 LAB — IRON AND TIBC
Iron: 42 ug/dL (ref 41–142)
Saturation Ratios: 20 % — ABNORMAL LOW (ref 21–57)
TIBC: 214 ug/dL — ABNORMAL LOW (ref 236–444)
UIBC: 171 ug/dL (ref 120–384)

## 2020-04-10 DIAGNOSIS — M059 Rheumatoid arthritis with rheumatoid factor, unspecified: Secondary | ICD-10-CM | POA: Diagnosis not present

## 2020-04-10 DIAGNOSIS — D473 Essential (hemorrhagic) thrombocythemia: Secondary | ICD-10-CM | POA: Diagnosis not present

## 2020-04-10 DIAGNOSIS — Z0000 Encounter for general adult medical examination without abnormal findings: Secondary | ICD-10-CM | POA: Diagnosis not present

## 2020-04-10 DIAGNOSIS — I700 Atherosclerosis of aorta: Secondary | ICD-10-CM | POA: Diagnosis not present

## 2020-04-21 ENCOUNTER — Other Ambulatory Visit (HOSPITAL_COMMUNITY)
Admission: RE | Admit: 2020-04-21 | Discharge: 2020-04-21 | Disposition: A | Discharge status: Home / Self Care | Payer: Medicare Other | Source: Ambulatory Visit | Attending: Emergency Medicine | Admitting: Emergency Medicine

## 2020-04-21 DIAGNOSIS — Z20822 Contact with and (suspected) exposure to covid-19: Secondary | ICD-10-CM | POA: Insufficient documentation

## 2020-04-21 DIAGNOSIS — Z01812 Encounter for preprocedural laboratory examination: Secondary | ICD-10-CM | POA: Insufficient documentation

## 2020-04-21 LAB — SARS CORONAVIRUS 2 (TAT 6-24 HRS): SARS Coronavirus 2: NEGATIVE

## 2020-04-24 ENCOUNTER — Ambulatory Visit: Payer: Medicare Other | Admitting: Emergency Medicine

## 2020-04-24 ENCOUNTER — Other Ambulatory Visit: Payer: Self-pay

## 2020-04-24 ENCOUNTER — Encounter: Payer: Self-pay | Admitting: Emergency Medicine

## 2020-04-24 ENCOUNTER — Ambulatory Visit (INDEPENDENT_AMBULATORY_CARE_PROVIDER_SITE_OTHER): Payer: Medicare Other | Admitting: Emergency Medicine

## 2020-04-24 DIAGNOSIS — J849 Interstitial pulmonary disease, unspecified: Secondary | ICD-10-CM

## 2020-04-24 DIAGNOSIS — J309 Allergic rhinitis, unspecified: Secondary | ICD-10-CM | POA: Diagnosis not present

## 2020-04-24 DIAGNOSIS — J45909 Unspecified asthma, uncomplicated: Secondary | ICD-10-CM

## 2020-04-24 DIAGNOSIS — K219 Gastro-esophageal reflux disease without esophagitis: Secondary | ICD-10-CM

## 2020-04-24 LAB — PULMONARY FUNCTION TEST
DL/VA % pred: 139 %
DL/VA: 5.76 ml/min/mmHg/L
DLCO cor % pred: 83 %
DLCO cor: 15.44 ml/min/mmHg
DLCO unc % pred: 83 %
DLCO unc: 15.44 ml/min/mmHg
FEF 25-75 Post: 0.38 L/sec
FEF 25-75 Pre: 0.48 L/sec
FEF2575-%Change-Post: -19 %
FEF2575-%Pred-Post: 26 %
FEF2575-%Pred-Pre: 32 %
FEV1-%Change-Post: -4 %
FEV1-%Pred-Post: 55 %
FEV1-%Pred-Pre: 57 %
FEV1-Post: 0.9 L
FEV1-Pre: 0.94 L
FEV1FVC-%Change-Post: 2 %
FEV1FVC-%Pred-Pre: 65 %
FEV6-%Change-Post: -7 %
FEV6-%Pred-Post: 85 %
FEV6-%Pred-Pre: 92 %
FEV6-Post: 1.75 L
FEV6-Pre: 1.89 L
FEV6FVC-%Change-Post: 0 %
FEV6FVC-%Pred-Post: 103 %
FEV6FVC-%Pred-Pre: 104 %
FVC-%Change-Post: -6 %
FVC-%Pred-Post: 82 %
FVC-%Pred-Pre: 88 %
FVC-Post: 1.76 L
FVC-Pre: 1.89 L
Post FEV1/FVC ratio: 51 %
Post FEV6/FVC ratio: 99 %
Pre FEV1/FVC ratio: 50 %
Pre FEV6/FVC Ratio: 100 %
RV % pred: 124 %
RV: 2.76 L
TLC % pred: 95 %
TLC: 4.67 L

## 2020-04-24 MED ORDER — BREZTRI AEROSPHERE 160-9-4.8 MCG/ACT IN AERO: 2.0000 | INHALATION_SPRAY | Freq: Two times a day (BID) | RESPIRATORY_TRACT | 0 refills | Status: DC

## 2020-04-24 NOTE — Progress Notes (Signed)
PFT done today. 

## 2020-04-24 NOTE — Progress Notes (Signed)
  Subjective:    Patient ID: [name], female    DOB: [birth year]   MRN: [id number] HPI  ROV 04/24/20 --Ms. Pershing is 18 and has a history of rheumatoid arthritis on methotrexate, moderate persistent asthma but also chronic upper airway irritation syndrome and vocal dysfunction.  She has chronic cough in association with these.  GERD and allergic rhinitis contribute to her flaring.  Currently managed on Dulera.  She is not sure that this is as good as Symbicort (which is more costly).  Singulair, flonase nasal spray, rare albuterol use (better). Has wheeze, exertional SOB. Cough seems to be under control right now.  We performed a high-resolution CT scan of chest 02/19/2020 which I have reviewed which showed a few groundglass patchy foci, septal thickening some mild subpleural reticulation without honeycomb change, question UIP pattern.  More predominant in the apices.  Repeat pulmonary function testing done today reviewed by me, show severe obstruction without a bronchodilator response, normal volumes and normal diffusion capacity.  Her FEV1 is 0.94 L, compared with 02/03/2017 1.11 L (with a vigorous bronchodilator response at that time)  MDM Reviewed pulmonary function testing from today Reviewed CT chest from 02/19/2020 Reviewed hematology notes from 03/19/2020    Objective:   Physical Exam Vitals:   04/24/20 1207 04/24/20 1209  BP: 116/60   Pulse: 86   Temp: (!) 97.3 F (36.3 C)   TempSrc: Temporal   SpO2: (!) 84% 100%  Weight: 162 lb 12.8 oz (73.8 kg)   Height: 5\' 2"  (1.575 m)    Gen: Pleasant, overwt, in no distress,  normal affect  ENT: No lesions,  mouth clear,  oropharynx clear, no postnasal drip, strong voice  Neck: supple, loud exp stridor  Lungs : distant, difficult to discern BS due to loud exp UA noise today.   Cardiovascular: RRR, heart sounds normal, no murmur or gallops, no peripheral edema  Musculoskeletal: No deformities, no cyanosis or clubbing  Neuro:  alert, non focal  Skin: Warm, no rash    Assessment & Plan:   Intrinsic asthma Severe obstruction by PFT today without the same bronchodilator response as seen on her prior.  I think would be reasonable to try to add a LAMA.  We will stop her Dulera and try Breztri to see if she benefits.  Cost will be an issue.  We will start the [place] assistance paperwork.  Keep albuterol available.  Attempt to control her exacerbating factors including GERD and allergic rhinitis  Allergic rhinitis Continue Flonase, Singulair  ILD (interstitial lung disease) ([place]) Identified by high-resolution CT scan 02/19/2020.  There is a superior to inferior gradient, peripheral pattern without significant honeycombing.  Question whether this is related to RA, methotrexate.  The may not be any intervention to make other than following CTs but I would like for her to see Dr. Vaughan Browner in the ILD clinic to get his opinion.  GERD (gastroesophageal reflux disease) Continue omeprazole 20 mg daily.  This may need to be ramped up going forward given her persistent cough, significant upper airway symptoms.  Baltazar Apo, MD, PhD 04/24/2020, 12:45 PM [facility] [phone] or if no answer [phone]

## 2020-04-24 NOTE — Assessment & Plan Note (Signed)
Continue omeprazole 20 mg daily.  This may need to be ramped up going forward given her persistent cough, significant upper airway symptoms.

## 2020-04-24 NOTE — Addendum Note (Signed)
Addended by: Gavin Potters R on: 04/24/2020 01:18 PM   Modules accepted: Orders

## 2020-04-24 NOTE — Assessment & Plan Note (Signed)
Continue Flonase, Singulair

## 2020-04-24 NOTE — Assessment & Plan Note (Signed)
Identified by high-resolution CT scan 02/19/2020.  There is a superior to inferior gradient, peripheral pattern without significant honeycombing.  Question whether this is related to RA, methotrexate.  The may not be any intervention to make other than following CTs but I would like for her to see Dr. Vaughan Browner in the ILD clinic to get his opinion.

## 2020-04-24 NOTE — Assessment & Plan Note (Signed)
Severe obstruction by PFT today without the same bronchodilator response as seen on her prior.  I think would be reasonable to try to add a LAMA.  We will stop her Dulera and try Breztri to see if she benefits.  Cost will be an issue.  We will start the [place] assistance paperwork.  Keep albuterol available.  Attempt to control her exacerbating factors including GERD and allergic rhinitis

## 2020-04-24 NOTE — Patient Instructions (Addendum)
Please stop your [facility] for now. We will try starting Breztri 2 puffs twice a day.  Rinse and gargle after using. Keep your albuterol available to use 2 puffs if needed for shortness of breath, chest tightness, wheezing. Please continue your Singulair and fluticasone nasal spray as you have been using them. Please continue omeprazole (Prilosec) 20 mg once daily. We will refer you to consult with our ILD clinic, Dr. Vaughan Browner to review your CT chest in the context of your rheumatoid arthritis and your methotrexate use Follow with Dr Lamonte Sakai in 1 month or next available

## 2020-04-28 ENCOUNTER — Telehealth: Payer: Self-pay

## 2020-04-29 ENCOUNTER — Other Ambulatory Visit: Payer: Self-pay | Admitting: *Deleted

## 2020-04-29 NOTE — Patient Outreach (Signed)
[facility]) Care Management  04/29/2020  [name] 15-Sep-1946 [id number]  Unsuccessful outreach attempt made to patient screening #1. Patient answered the call and stated she was on her way to pick-up her grandchildren from school and asked if nurse could call her back at a later date. Plan: RN Health Coach will send unsuccessful letter and will call patient within the next several business days.  Emelia Loron RN, BSN [place] [phone] [email or web address]

## 2020-04-30 DIAGNOSIS — L720 Epidermal cyst: Secondary | ICD-10-CM | POA: Diagnosis not present

## 2020-04-30 DIAGNOSIS — L820 Inflamed seborrheic keratosis: Secondary | ICD-10-CM | POA: Diagnosis not present

## 2020-05-05 ENCOUNTER — Encounter: Payer: Self-pay | Admitting: *Deleted

## 2020-05-05 ENCOUNTER — Other Ambulatory Visit: Payer: Self-pay | Admitting: *Deleted

## 2020-05-05 NOTE — Patient Outreach (Addendum)
[facility]) Care Management  [place]  05/05/2020   [name] [birth year] [id number]  Subjective: Successful telephone outreach call to patient. HIPAA identifiers obtained. Patient states she is doing  better with her breathing since her doctor changed her inhaler to [employer], reporting she is not using her nebulizer several  times daily now and more like 1-3 times weekly. Patient explained that she is unable to afford albuterol inhaler and she will need assistance with affording Breztri as she is currently using the samples her doctor gave to her; nurse will place [place] referral. Patient also explained that she needs dental work and cannot afford to go to the dentist. Nurse will send patient dental resources that are more affordable. Ms. Jarema explained that she does get short of breath with physical activity but she would be willing to try to do chair exercises to help with her lung status and to maintain her strength. Per patient her breathing does not interfere with her eating and there are no issues with her nutritional status. She denies having any falls or ambulatory problems. She is well supported by her family and is currently independent with ADLs and IADLs. Patient did not voice any other concerns at this time.   Encounter Medications:  Outpatient Encounter Medications as of 05/05/2020  Medication Sig Note   albuterol (PROVENTIL HFA) 108 (90 Base) MCG/ACT inhaler Inhale 2 puffs into the lungs every 4 (four) hours as needed for wheezing or shortness of breath. Reported on 12/30/2015    aspirin EC 81 MG tablet Take 81 mg by mouth daily.    benzonatate (TESSALON) 200 MG capsule Take 1 capsule (200 mg total) by mouth 3 (three) times daily as needed for cough.    Budeson-Glycopyrrol-Formoterol (BREZTRI AEROSPHERE) 160-9-4.8 MCG/ACT AERO Inhale 2 puffs into the lungs in the morning and at bedtime.    cholecalciferol (VITAMIN D) 1000 UNITS  tablet Take 1,000 Units by mouth daily.    fluticasone (FLONASE) 50 MCG/ACT nasal spray Place 1 spray into the nose 2 (two) times daily.     folic acid (FOLVITE) 1 MG tablet Take 1 tablet (1 mg total) by mouth daily.    hydroxyurea (HYDREA) 500 MG capsule Take 1 capsule (500 mg total) by mouth 3 (three) times daily before meals.    Magnesium 200 MG TABS Take 1 tablet by mouth 2 (two) times daily.     montelukast (SINGULAIR) 10 MG tablet TAKE 1 TABLET BY MOUTH EVERYDAY AT BEDTIME    omeprazole (PRILOSEC) 20 MG capsule TAKE 1 TABLET TWICE A DAY.    pravastatin (PRAVACHOL) 40 MG tablet Take 40 mg by mouth daily.    albuterol (PROVENTIL) (2.5 MG/3ML) 0.083% nebulizer solution Take 3 mLs (2.5 mg total) by nebulization every 4 (four) hours as needed for shortness of breath. Reported on 12/30/2015 (Patient not taking: Reported on 05/05/2020) 05/05/2020: Cannot afford   budesonide-formoterol (SYMBICORT) 160-4.5 MCG/ACT inhaler Inhale 2 puffs into the lungs 2 (two) times daily. (Patient not taking: Reported on 05/05/2020) 05/05/2020: Replaced by Judithann Sauger   clonazePAM (KLONOPIN) 0.5 MG tablet TAKE 1 TABLET BY MOUTH TWICE DAILY AS NEEDED FOR ANXIETY 05/05/2020: Patient states she does not need this and has not taken it in a long time.   mometasone-formoterol (DULERA) 200-5 MCG/ACT AERO Inhale 2 puffs into the lungs 2 (two) times daily. 05/05/2020: Replaced by Judithann Sauger   Facility-Administered Encounter Medications as of 05/05/2020  Medication   0.9 %  sodium chloride infusion  Functional Status:  In your present state of health, do you have any difficulty performing the following activities: 05/05/2020  Hearing? N  Vision? N  Difficulty concentrating or making decisions? N  Walking or climbing stairs? N  Dressing or bathing? N  Doing errands, shopping? N  Preparing Food and eating ? N  Using the Toilet? N  In the past six months, have you accidently leaked urine? N  Do you have problems with loss of  bowel control? N  Managing your Medications? N  Managing your Finances? N  Housekeeping or managing your Housekeeping? N  Some recent data might be hidden    Fall/Depression Screening: Fall Risk  05/05/2020 06/11/2019 06/10/2019  Falls in the past year? 0 0 0  Number falls in past yr: 0 - 0  Injury with Fall? 0 - 0  Comment - - -  Risk Factor Category  - - -  Risk for fall due to : - - -  Follow up Falls prevention discussed;Education provided;Falls evaluation completed - Falls prevention discussed   PHQ 2/9 Scores 05/05/2020 06/11/2019 06/10/2019 06/10/2019 03/29/2018 01/31/2014  PHQ - 2 Score 0 1 0 0 0 0   THN CM Care Plan Problem One     Most Recent Value  Care Plan Problem One  Knowledge deficient related to COPD  treatment plan and lifestyle changes  Role Documenting the Problem One  Health Coach  Care Plan for Problem One  Active  THN Long Term Goal   Patient will not be hospitalized for COPD exacerbation within the next 90 days  THN Long Term Goal Start Date  05/05/20  Interventions for Problem One Long Term Goal  RN provided patient with written and verbal education on COPD exacerbation and prevention, provided patient with COPD Action Plan and reinforced importance of daily self-assessment of COPD Zones, educated patient that increasing physical activity will help maintain strength and stamina as well as improve their breathing ability, sent Living Better with COPD, Emmi chair exercise printouts and breathing exercises.      Plan:  Nurse will send PCP  Barrier Letter and today's assessment note, Nurse will send patient education regarding COPD, EMMI chair and breathing exercises,  referral to pharmacy for medication assistance, Advanced Directives, dental resources, and resource to assist with placing grab bars in her bathroom for safety.  RN Health Coach will call patient within the month of July and patient agrees to future outreach calls.   Emelia Loron RN, BSN [place] [phone] [email or web address]

## 2020-05-05 NOTE — Patient Outreach (Signed)
Referral from Emelia Loron, RN to [place] on 05/05/20  [place]

## 2020-05-11 DIAGNOSIS — Z1231 Encounter for screening mammogram for malignant neoplasm of breast: Secondary | ICD-10-CM | POA: Diagnosis not present

## 2020-05-12 ENCOUNTER — Other Ambulatory Visit: Payer: Self-pay | Admitting: Pharmacy Technician

## 2020-05-12 NOTE — Patient Outreach (Signed)
[facility]) Care Management  05/12/2020  TYRHONDA GEORGIADES Oct 06, 1946 [id number]                                       Medication Assistance Referral  Referral From: [place]   Medication/Company: Proventil HFA / [employer] Patient application portion:  Education officer, museum portion: Magazine features editor to Dr. Franco Collet Provider address/fax verified via: Office website  Medication/Company: Judithann Sauger / [place] Patient application portion:  Mailed Provider application portion: Interoffice Mailed to Dr. Franco Collet Provider address/fax verified via: Office website   Maud Deed. Chana Bode, [place] Certified Pharmacy Technician [employer] engineer

## 2020-05-19 ENCOUNTER — Other Ambulatory Visit: Payer: Self-pay

## 2020-05-19 ENCOUNTER — Inpatient Hospital Stay (HOSPITAL_BASED_OUTPATIENT_CLINIC_OR_DEPARTMENT_OTHER): Payer: Medicare Other | Admitting: Hematology & Oncology

## 2020-05-19 ENCOUNTER — Inpatient Hospital Stay: Payer: Medicare Other | Attending: Hematology & Oncology

## 2020-05-19 ENCOUNTER — Telehealth: Payer: Self-pay | Admitting: Hematology & Oncology

## 2020-05-19 VITALS — BP 108/67 | HR 87 | Temp 96.9°F | Resp 18 | Wt 159.2 lb

## 2020-05-19 DIAGNOSIS — Z79899 Other long term (current) drug therapy: Secondary | ICD-10-CM | POA: Insufficient documentation

## 2020-05-19 DIAGNOSIS — Z800 Family history of malignant neoplasm of digestive organs: Secondary | ICD-10-CM | POA: Diagnosis not present

## 2020-05-19 DIAGNOSIS — D573 Sickle-cell trait: Secondary | ICD-10-CM | POA: Insufficient documentation

## 2020-05-19 DIAGNOSIS — Z7982 Long term (current) use of aspirin: Secondary | ICD-10-CM | POA: Insufficient documentation

## 2020-05-19 DIAGNOSIS — R899 Unspecified abnormal finding in specimens from other organs, systems and tissues: Secondary | ICD-10-CM | POA: Diagnosis not present

## 2020-05-19 DIAGNOSIS — D473 Essential (hemorrhagic) thrombocythemia: Secondary | ICD-10-CM

## 2020-05-19 LAB — CMP (CANCER CENTER ONLY)
ALT: 12 U/L (ref 0–44)
AST: 13 U/L — ABNORMAL LOW (ref 15–41)
Albumin: 3.8 g/dL (ref 3.5–5.0)
Alkaline Phosphatase: 69 U/L (ref 38–126)
Anion gap: 8 (ref 5–15)
BUN: 7 mg/dL — ABNORMAL LOW (ref 8–23)
CO2: 26 mmol/L (ref 22–32)
Calcium: 9.2 mg/dL (ref 8.9–10.3)
Chloride: 109 mmol/L (ref 98–111)
Creatinine: 0.93 mg/dL (ref 0.44–1.00)
GFR, Est AFR Am: >60 mL/min (ref >60)
GFR, Estimated: >60 mL/min (ref >60)
Glucose, Bld: 101 mg/dL — ABNORMAL HIGH (ref 70–99)
Potassium: 3.6 mmol/L (ref 3.5–5.1)
Sodium: 143 mmol/L (ref 135–145)
Total Bilirubin: 0.3 mg/dL (ref 0.3–1.2)
Total Protein: 6 g/dL — ABNORMAL LOW (ref 6.5–8.1)

## 2020-05-19 LAB — CBC WITH DIFFERENTIAL (CANCER CENTER ONLY)
Abs Immature Granulocytes: 0.05 10*3/uL (ref 0.00–0.07)
Basophils Absolute: 0.1 10*3/uL (ref 0.0–0.1)
Basophils Relative: 1 %
Eosinophils Absolute: 0.1 10*3/uL (ref 0.0–0.5)
Eosinophils Relative: 1 %
HCT: 39 % (ref 36.0–46.0)
Hemoglobin: 12.2 g/dL (ref 12.0–15.0)
Immature Granulocytes: 1 %
Lymphocytes Relative: 24 %
Lymphs Abs: 1.4 10*3/uL (ref 0.7–4.0)
MCH: 27.2 pg (ref 26.0–34.0)
MCHC: 31.3 g/dL (ref 30.0–36.0)
MCV: 87.1 fL (ref 80.0–100.0)
Monocytes Absolute: 0.6 10*3/uL (ref 0.1–1.0)
Monocytes Relative: 9 %
Neutro Abs: 3.9 10*3/uL (ref 1.7–7.7)
Neutrophils Relative %: 64 %
Platelet Count: 409 10*3/uL — ABNORMAL HIGH (ref 150–400)
RBC: 4.48 MIL/uL (ref 3.87–5.11)
RDW: 15.5 % (ref 11.5–15.5)
WBC Count: 6 10*3/uL (ref 4.0–10.5)
nRBC: 0 % (ref 0.0–0.2)

## 2020-05-19 LAB — SAVE SMEAR(SSMR), FOR PROVIDER SLIDE REVIEW

## 2020-05-19 NOTE — Telephone Encounter (Signed)
Appointments scheduled calendar printed per 6/15 los 

## 2020-05-19 NOTE — Progress Notes (Signed)
Hematology and Oncology Follow Up Visit  [name] [id number] Oct 10, 1946 74 y.o. 05/19/2020   Principle Diagnosis:  Essential thrombocythemia - JAK2 POSITIVE Sickle cell trait  Current Therapy:   Hydrea 500 mg PO TID - dose changed on 62/37/6283 Folic acid 1 mg by mouth daily Aspirin 81 mg daily    Interim History:  [name] is here today for follow-up.  The problem that we have right now is that she is losing weight.  She does not have an appetite.  She is having some back pain with some radiation down the left arm and left leg.  I am not sure what is going on.  She has lost about 14-15 pounds in the past month or so.  She just does not have much of an appetite.  She is having no nausea or vomiting.  There is no diarrhea or constipation.  She is having no fever.  There is no rash.  She has had no sweats.  She says her breathing seems to be doing a bit better.  I think we have to get a CT scan on her.  There is a relatively strong family history of cancer.  Her father died of colon cancer.  She is not sure when her last colonoscopy was.  She had a sister died of cancer.  She was not sure what kind of cancer this was.  She has been taking the Hydrea.  Her blood count looks great.  I will think that really is the source of the weight loss.  There is been no swallowing difficulties.  She has had no headache.  There is been no leg swelling.  Overall, I would say her performance status is ECOG 1.    Medications:  Allergies as of 05/19/2020      Reactions   Penicillins Itching, Rash      Medication List       Accurate as of May 19, 2020 12:02 PM. If you have any questions, ask your nurse or doctor.        albuterol 108 (90 Base) MCG/ACT inhaler Commonly known as: Proventil HFA Inhale 2 puffs into the lungs every 4 (four) hours as needed for wheezing or shortness of breath. Reported on 12/30/2015   albuterol (2.5 MG/3ML) 0.083% nebulizer solution Commonly known  as: PROVENTIL Take 3 mLs (2.5 mg total) by nebulization every 4 (four) hours as needed for shortness of breath. Reported on 12/30/2015   aspirin EC 81 MG tablet Take 81 mg by mouth daily.   benzonatate 200 MG capsule Commonly known as: TESSALON Take 1 capsule (200 mg total) by mouth 3 (three) times daily as needed for cough.   Breztri Aerosphere 160-9-4.8 MCG/ACT Aero Generic drug: Budeson-Glycopyrrol-Formoterol Inhale 2 puffs into the lungs in the morning and at bedtime.   budesonide-formoterol 160-4.5 MCG/ACT inhaler Commonly known as: SYMBICORT Inhale 2 puffs into the lungs 2 (two) times daily.   cholecalciferol 1000 units tablet Commonly known as: VITAMIN D Take 1,000 Units by mouth daily.   clonazePAM 0.5 MG tablet Commonly known as: KLONOPIN TAKE 1 TABLET BY MOUTH TWICE DAILY AS NEEDED FOR ANXIETY   Dulera 200-5 MCG/ACT Aero Generic drug: mometasone-formoterol Inhale 2 puffs into the lungs 2 (two) times daily.   fluticasone 50 MCG/ACT nasal spray Commonly known as: FLONASE Place 1 spray into the nose 2 (two) times daily.   folic acid 1 MG tablet Commonly known as: FOLVITE Take 1 tablet (1 mg total) by mouth daily.  hydroxyurea 500 MG capsule Commonly known as: HYDREA Take 1 capsule (500 mg total) by mouth 3 (three) times daily before meals.   Magnesium 200 MG Tabs Take 1 tablet by mouth 2 (two) times daily.   montelukast 10 MG tablet Commonly known as: SINGULAIR TAKE 1 TABLET BY MOUTH EVERYDAY AT BEDTIME   omeprazole 20 MG capsule Commonly known as: PRILOSEC TAKE 1 TABLET TWICE A DAY.   pravastatin 40 MG tablet Commonly known as: PRAVACHOL Take 40 mg by mouth daily.       Allergies:  Allergies  Allergen Reactions  . Penicillins Itching and Rash    Past Medical History, Surgical history, Social history, and Family History were reviewed and updated.  Review of Systems:    Physical Exam:  weight is 159 lb 4 oz (72.2 kg). Her temporal  temperature is 96.9 F (36.1 C) (abnormal). Her blood pressure is 108/67 and her pulse is 87. Her respiration is 18 and oxygen saturation is 100%.   Wt Readings from Last 3 Encounters:  05/19/20 159 lb 4 oz (72.2 kg)  04/24/20 162 lb 12.8 oz (73.8 kg)  03/19/20 166 lb (75.3 kg)       Lab Results  Component Value Date   WBC 6.0 05/19/2020   HGB 12.2 05/19/2020   HCT 39.0 05/19/2020   MCV 87.1 05/19/2020   PLT 409 (H) 05/19/2020   Lab Results  Component Value Date   FERRITIN 236 03/19/2020   IRON 42 03/19/2020   TIBC 214 (L) 03/19/2020   UIBC 171 03/19/2020   IRONPCTSAT 20 (L) 03/19/2020   Lab Results  Component Value Date   RETICCTPCT 0.8 06/20/2019   RBC 4.48 05/19/2020   RETICCTABS 73.1 08/06/2015   No results found for: KPAFRELGTCHN, LAMBDASER, KAPLAMBRATIO No results found for: IGGSERUM, IGA, IGMSERUM No results found for: Kathrynn Ducking, MSPIKE, SPEI   Chemistry      Component Value Date/Time   NA 138 03/19/2020 1311   NA 145 11/24/2017 0754   NA 142 05/25/2017 0755   K 3.5 03/19/2020 1311   K 3.7 11/24/2017 0754   K 3.9 05/25/2017 0755   CL 106 03/19/2020 1311   CL 104 11/24/2017 0754   CO2 21 (L) 03/19/2020 1311   CO2 27 11/24/2017 0754   CO2 24 05/25/2017 0755   BUN 8 03/19/2020 1311   BUN 12 11/24/2017 0754   BUN 13.3 05/25/2017 0755   CREATININE 0.80 03/19/2020 1311   CREATININE 1.0 11/24/2017 0754   CREATININE 0.8 05/25/2017 0755      Component Value Date/Time   CALCIUM 9.3 03/19/2020 1311   CALCIUM 9.1 11/24/2017 0754   CALCIUM 9.5 05/25/2017 0755   ALKPHOS 64 03/19/2020 1311   ALKPHOS 70 11/24/2017 0754   ALKPHOS 74 05/25/2017 0755   AST 21 03/19/2020 1311   AST 21 05/25/2017 0755   ALT 16 03/19/2020 1311   ALT 18 11/24/2017 0754   ALT 14 05/25/2017 0755   BILITOT 0.6 03/19/2020 1311   BILITOT 0.37 05/25/2017 0755       Impression and Plan: [name] is a very pleasant 74 yo African  American female with essential thrombocythemia, that is JAK2 positive.  In addition, she has the sickle cell trait.  I we will see what the CAT scan shows.  Hopefully, there is no malignancy that we have to deal with.  I am going to check her thyroid make sure there is no hyper thyroidism.  She  certainly does not look like she has hyperthyroidism.  I would like to get her back in 1 month.  I think we have to make sure we have close follow-up with her so we can monitor changes.  I had a spent about 30 minutes or so with her today.  This was a little bit more complicated than I would have initially thought.  I just was not aware about the weight loss until I talk to her.      [name], MD 6/15/202112:02 PM

## 2020-05-20 ENCOUNTER — Ambulatory Visit (HOSPITAL_BASED_OUTPATIENT_CLINIC_OR_DEPARTMENT_OTHER)
Admission: RE | Admit: 2020-05-20 | Discharge: 2020-05-20 | Disposition: A | Discharge status: Home / Self Care | Payer: Medicare Other | Source: Ambulatory Visit | Attending: Hematology & Oncology | Admitting: Hematology & Oncology

## 2020-05-20 ENCOUNTER — Encounter (HOSPITAL_BASED_OUTPATIENT_CLINIC_OR_DEPARTMENT_OTHER): Payer: Self-pay

## 2020-05-20 DIAGNOSIS — R911 Solitary pulmonary nodule: Secondary | ICD-10-CM | POA: Insufficient documentation

## 2020-05-20 DIAGNOSIS — J439 Emphysema, unspecified: Secondary | ICD-10-CM | POA: Insufficient documentation

## 2020-05-20 DIAGNOSIS — D473 Essential (hemorrhagic) thrombocythemia: Secondary | ICD-10-CM

## 2020-05-20 DIAGNOSIS — I700 Atherosclerosis of aorta: Secondary | ICD-10-CM | POA: Diagnosis not present

## 2020-05-20 DIAGNOSIS — K449 Diaphragmatic hernia without obstruction or gangrene: Secondary | ICD-10-CM | POA: Diagnosis not present

## 2020-05-20 DIAGNOSIS — I2510 Atherosclerotic heart disease of native coronary artery without angina pectoris: Secondary | ICD-10-CM | POA: Diagnosis not present

## 2020-05-20 DIAGNOSIS — M4316 Spondylolisthesis, lumbar region: Secondary | ICD-10-CM | POA: Insufficient documentation

## 2020-05-20 DIAGNOSIS — M47816 Spondylosis without myelopathy or radiculopathy, lumbar region: Secondary | ICD-10-CM | POA: Diagnosis not present

## 2020-05-20 LAB — IRON AND TIBC
Iron: 69 ug/dL (ref 41–142)
Saturation Ratios: 32 % (ref 21–57)
TIBC: 215 ug/dL — ABNORMAL LOW (ref 236–444)
UIBC: 146 ug/dL (ref 120–384)

## 2020-05-20 LAB — FERRITIN: Ferritin: 168 ng/mL (ref 11–307)

## 2020-05-20 LAB — LACTATE DEHYDROGENASE: LDH: 314 U/L — ABNORMAL HIGH (ref 98–192)

## 2020-05-20 MED ORDER — IOHEXOL 300 MG/ML  SOLN
100.0000 mL | Freq: Once | INTRAMUSCULAR | Status: AC | PRN
  Administered 2020-05-20: 100 mL via INTRAVENOUS

## 2020-05-21 ENCOUNTER — Telehealth: Payer: Self-pay | Admitting: *Deleted

## 2020-05-21 NOTE — Telephone Encounter (Signed)
-----   Message from [name], MD sent at 05/20/2020  3:49 PM EDT ----- Call - the CT Scan does not show any obvious cancer!!  [name]

## 2020-05-21 NOTE — Telephone Encounter (Signed)
As noted below by Dr. Marin Olp, I informed the patient that the CT scan does not show any obvious cancer. She verbalized understanding.

## 2020-05-25 ENCOUNTER — Encounter: Payer: Self-pay | Admitting: Emergency Medicine

## 2020-05-25 ENCOUNTER — Ambulatory Visit: Payer: Medicare Other | Admitting: Emergency Medicine

## 2020-05-25 ENCOUNTER — Ambulatory Visit (INDEPENDENT_AMBULATORY_CARE_PROVIDER_SITE_OTHER): Payer: Medicare Other | Admitting: Pharmacist

## 2020-05-25 ENCOUNTER — Other Ambulatory Visit: Payer: Self-pay

## 2020-05-25 DIAGNOSIS — J45909 Unspecified asthma, uncomplicated: Secondary | ICD-10-CM

## 2020-05-25 DIAGNOSIS — J383 Other diseases of vocal cords: Secondary | ICD-10-CM

## 2020-05-25 DIAGNOSIS — J849 Interstitial pulmonary disease, unspecified: Secondary | ICD-10-CM | POA: Diagnosis not present

## 2020-05-25 DIAGNOSIS — Z0000 Encounter for general adult medical examination without abnormal findings: Secondary | ICD-10-CM

## 2020-05-25 MED ORDER — ALBUTEROL SULFATE HFA 108 (90 BASE) MCG/ACT IN AERS: 2.0000 | INHALATION_SPRAY | RESPIRATORY_TRACT | 1 refills | Status: DC | PRN

## 2020-05-25 MED ORDER — PNEUMOCOCCAL VAC POLYVALENT 25 MCG/0.5ML IJ INJ: 0.5000 mL | INJECTION | Freq: Once | INTRAMUSCULAR | 0 refills | Status: AC

## 2020-05-25 MED ORDER — ZOSTER VAC RECOMB ADJUVANTED 50 MCG/0.5ML IM SUSR: 0.5000 mL | Freq: Once | INTRAMUSCULAR | 1 refills | Status: AC

## 2020-05-25 NOTE — Patient Instructions (Signed)
We will continue Breztri 2 puffs twice a day.  Rinse and gargle after using. We will work on getting financial assistance for this medication. Keep albuterol available to use nebulizer treatment or 2 puffs up to every 4 hours if needed for shortness of breath, chest tightness, wheezing. We will work on getting you a new nebulizer machine Follow with Dr. Vaughan Browner 06/16/2020 as planned to review your CT scan of the chest since you have rheumatoid arthritis, have been on methotrexate Please continue your allergy medication and your reflux medication as you have been taking it Follow with Dr. Lamonte Sakai in 2 months or sooner if you have any problems.

## 2020-05-25 NOTE — Assessment & Plan Note (Signed)
Noted on her CT chest 02/2020.  I suspect related to rheumatoid disease, methotrexate but I would like her to follow-up at least once with Dr. Vaughan Browner in the ILD clinic to see if any other evaluation is indicated.  She has an appointment 06/16/2020.

## 2020-05-25 NOTE — Progress Notes (Signed)
HPI Patient presents today to [facility] to see pharmacy team for inhaler education and financial assistance.  Past medical history includes allergic rhinitis, asthma, ILD, esophageal stricture, GERD, superficial thrombophlebitis, migraine, hx of TIA (2012), DM, OA, osteoporosis, trigger finger, DDD, DJD, arthritis, essential thrombocythemia, HLD, RLS, JAK-2 gene mutation, and sickle cell. Patient states she used to get pneumonia every year, however, she has not gotten pneumonia in at least 5 or 6 years (per patient report). She reports having an issue with cost with Breztri. Patient reports living in a 1 person household and makes $1200 monthly (annual income $14,400). She reports breathing has improved since transition to [place].   Number of hospitalizations in past year: 1 Number of COPD/asthma exacerbations in past year: "maybe once or twice"  Respiratory Medications Current: Cleatrice Burke in past: Dulera (lacked efficacy), Symbicort (worked for a "little bit" but stopped working), Geophysicist/field seismologist  (felt symbicort worked better than Trelegy), Spiriva Respimat, Spiriva Handihaler  Patient reports no known adherence challenges  OBJECTIVE Allergies  Allergen Reactions   Penicillins Itching and Rash    Outpatient Encounter Medications as of 05/25/2020  Medication Sig Note   albuterol (PROVENTIL) (2.5 MG/3ML) 0.083% nebulizer solution Take 3 mLs (2.5 mg total) by nebulization every 4 (four) hours as needed for shortness of breath. Reported on 12/30/2015 05/25/2020: Uses once every 3 weeks    aspirin EC 81 MG tablet Take 81 mg by mouth daily.    Budeson-Glycopyrrol-Formoterol (BREZTRI AEROSPHERE) 160-9-4.8 MCG/ACT AERO Inhale 2 puffs into the lungs in the morning and at bedtime.    cholecalciferol (VITAMIN D) 1000 UNITS tablet Take 1,000 Units by mouth daily.    fluticasone (FLONASE) 50 MCG/ACT nasal spray Place 1 spray into the nose 2 (two) times daily.     folic acid (FOLVITE)  1 MG tablet Take 1 tablet (1 mg total) by mouth daily.    hydroxyurea (HYDREA) 500 MG capsule Take 1 capsule (500 mg total) by mouth 3 (three) times daily before meals.    Magnesium 200 MG TABS Take 1 tablet by mouth 2 (two) times daily.     montelukast (SINGULAIR) 10 MG tablet TAKE 1 TABLET BY MOUTH EVERYDAY AT BEDTIME    omeprazole (PRILOSEC) 20 MG capsule TAKE 1 TABLET TWICE A DAY.    pravastatin (PRAVACHOL) 40 MG tablet Take 40 mg by mouth daily.    albuterol (PROVENTIL HFA) 108 (90 Base) MCG/ACT inhaler Inhale 2 puffs into the lungs every 4 (four) hours as needed for wheezing or shortness of breath. Reported on 12/30/2015 (Patient not taking: Reported on 05/25/2020) 05/25/2020: Has not used in a few months (ran out of medications)    benzonatate (TESSALON) 200 MG capsule Take 1 capsule (200 mg total) by mouth 3 (three) times daily as needed for cough. (Patient not taking: Reported on 05/25/2020)    [DISCONTINUED] budesonide-formoterol (SYMBICORT) 160-4.5 MCG/ACT inhaler Inhale 2 puffs into the lungs 2 (two) times daily. (Patient not taking: Reported on 05/05/2020) 05/05/2020: Replaced by Judithann Sauger   [DISCONTINUED] clonazePAM (KLONOPIN) 0.5 MG tablet TAKE 1 TABLET BY MOUTH TWICE DAILY AS NEEDED FOR ANXIETY 05/05/2020: Patient states she does not need this and has not taken it in a long time.   [DISCONTINUED] mometasone-formoterol (DULERA) 200-5 MCG/ACT AERO Inhale 2 puffs into the lungs 2 (two) times daily. 05/05/2020: Replaced by Judithann Sauger   Facility-Administered Encounter Medications as of 05/25/2020  Medication   0.9 %  sodium chloride infusion     Immunization History  Administered Date(s)  Administered   Fluad Quad(high Dose 65+) 09/16/2019   Influenza Split 11/10/2011, 10/10/2012, 09/17/2018   Influenza Whole 10/19/2006, 09/03/2008, 10/05/2009, 09/07/2010   Influenza, High Dose Seasonal PF 09/05/2017   Influenza,inj,Quad PF,6+ Mos 09/11/2013, 08/08/2014, 09/14/2015, 09/05/2016    Influenza-Unspecified 10/05/2018   Moderna SARS-COVID-2 Vaccination 01/27/2020, 02/25/2020   Pneumococcal Polysaccharide-23 03/01/2006   Td 03/20/2009   Zoster 10/19/2006     PFTs PFT Results Latest Ref Rng & Units 04/24/2020 02/03/2017  FVC-Pre L 1.89 1.96  FVC-Predicted Pre % 88 89  FVC-Post L 1.76 2.44  FVC-Predicted Post % 82 110  Pre FEV1/FVC % % 50 56  Post FEV1/FCV % % 51 61  FEV1-Pre L 0.94 1.11  FEV1-Predicted Pre % 57 65  FEV1-Post L 0.90 1.48  DLCO UNC% % 83 90  DLCO COR %Predicted % 139 114  TLC L 4.67 6.30  TLC % Predicted % 95 128  RV % Predicted % 124 199     Eosinophils First blood eosinophil count (per chart review) was 400 cell/microL taken on 06/19/2008 Most recent blood eosinophil count was 100 cells/microL taken on 05/19/2020.   IgE  Ref Range & Units 2 yr ago  06/12/17 3 yr ago  02/03/17 3 yr ago  06/29/16  IgE (Immunoglobulin E), Serum <115 kU/L 9  11  11   Resulting Agency  [facility]      Assessment   1. Inhaler Optimization  Optimal inhaler for patient would be considering Breztri considering patient finds medication efficacious currently, able to demonstrate proper administration, did not like prior trial of DPI (Trelegy Ellipta), and elevated eosinophils in the past. Also, provided patient with spacer to use with Breztri.   Patient was counseled on the purpose, proper use, and adverse effects of Breztri inhaler.  Instructed patient to rinse mouth with water after using in order to prevent fungal infection.  Patient verbalized understanding.  Reviewed appropriate use of maintenance vs rescue inhalers.  Stressed importance of using maintenance inhaler daily and rescue inhaler only as needed.  Patient verbalized understanding.  Demonstrated proper inhaler technique using Breztri demo inhaler.  Patient able to demonstrate proper inhaler technique and spacer technique using teach back method. Patient was given samples in office today  (LOT: [id number] Exp: 07/2021) that will last patient for 30 day supply  2. Financial Assistance (Breztri via [employer])   Patient reports living in a 1 person household and makes $1200 monthly (annual income $14,400). She meets requirements for [facility] patient assistance application (PAP) via [place]. It appears she has been in contact with [facility] pharmacist and that Breztri PAP application has been mailed to her. She has not received application yet. Provided patient with application and instructions on how to complete. Patient will complete application, attach proof of income documentation (Database administrator) and bring back to [facility] pulmonary care office to give to Dr. Lamonte Sakai to complete. From then Dr. Agustina Caroli team (CMA/nurse) can fax completed application. Patient verbalized understanding.   3. Medication Reconciliation  A drug regimen assessment was performed, including review of allergies, interactions, disease-state management, dosing and immunization history. Medications were reviewed with the patient, including name, instructions, indication, goals of therapy, potential side effects, importance of adherence, and safe use.  Drug interaction(s): no concerning interactions identified  4. Immunizations  Patient is indicated for second dose of pneumonia vaccine (pneumovax23) and Shingrix vaccine. Patient would prefer for prescriptions for immunizations to be sent to her preferred pharmacy.  5. Refills  Patient inquiring about refills for albuterol  rescue inhaler and for benzonatate. Pharmacy student, Hedda Slade, contacted [facility] to determine copay cost of albuterol inhaler will be $5.89. Rachael Perry,CPhT, can test claim for benzonatate and determined that medication was not covered by her insurance plan. Spenser researched cash price of benzonatate and determined that benzonatate 200 mg one capsule daily as needed (quantity 30) will cost $36 from [facility] and $45 from [facility]. Informed patient of  this information. She stated she will pick up prescription for albuterol, however, she cannot afford copay for benzonatate. Discussed with patient how there are not many efficacious medications available for cough. Advised patient implement honey in her diet.   PLAN 1. Continue using Breztri 2 puffs twice daily 2. Complete Breztri patient assistance application (PAP) via [employer] and bring completed application to [facility].  3. Obtain pneumonia and shingles vaccination from [facility] 4. Obtain albuterol refill from [facility]  All questions encouraged and answered.  Instructed patient to reach out with any further questions or concerns.  Thank you for allowing pharmacy to participate in this patient's care.  This appointment required 45 minutes of patient care (this includes precharting, chart review, review of results, face-to-face care, etc.).  Drexel Iha, PharmD PGY2 Ambulatory Care Pharmacy Resident

## 2020-05-25 NOTE — Assessment & Plan Note (Signed)
Continue to aggressively manage contributing factors.  She reports great GERD control at this time, good allergy control as well.  Continue same

## 2020-05-25 NOTE — Progress Notes (Signed)
  Subjective:    Patient ID: [name], female    DOB: [birth year]   MRN: [id number] HPI  ROV 04/24/20 --[name] is 74 and has a history of rheumatoid arthritis on methotrexate, moderate persistent asthma but also chronic upper airway irritation syndrome and vocal dysfunction.  She has chronic cough in association with these.  GERD and allergic rhinitis contribute to her flaring.  Currently managed on Dulera.  She is not sure that this is as good as Symbicort (which is more costly).  Singulair, flonase nasal spray, rare albuterol use (better). Has wheeze, exertional SOB. Cough seems to be under control right now.  We performed a high-resolution CT scan of chest 02/19/2020 which I have reviewed which showed a few groundglass patchy foci, septal thickening some mild subpleural reticulation without honeycomb change, question UIP pattern.  More predominant in the apices.  Repeat pulmonary function testing done today reviewed by me, show severe obstruction without a bronchodilator response, normal volumes and normal diffusion capacity.  Her FEV1 is 0.94 L, compared with 02/03/2017 1.11 L (with a vigorous bronchodilator response at that time)  ROV 05/25/20 --follow-up visit for 74 year old [name] with RA on methotrexate, moderate persistent asthma with chronic upper airway irritation syndrome and VCD, chronic cough.  Also with interstitial lung disease noted on high-resolution CT scan of the chest 02/19/2020, question related to her autoimmune disease, methotrexate.  Recent PFTs with severe obstruction.  I tried changing her [place] to [place] last visit to see if she would get benefit. She has noticed improvement - less wheeze and SOB.  She does albuterol nebs about weekly. She is working w our pharmacists to get financial assist for the breztri   She is to see Dr. Vaughan Browner on 06/16/2020 to review her CT scan of the chest      Objective:   Physical Exam Vitals:   05/25/20 1042  BP: 120/68  Temp: 98.4  F (36.9 C)  TempSrc: Oral  SpO2: [name]%  Weight: 160 lb 6.4 oz (72.8 kg)  Height: 5\' 2"  (1.575 m)   Gen: Pleasant, overwt, in no distress,  normal affect  ENT: No lesions,  mouth clear,  oropharynx clear, no postnasal drip, strong voice  Neck: supple, exp stridor  Lungs : distant, referred UA noise - about her baseline  Cardiovascular: RRR, heart sounds normal, no murmur or gallops, no peripheral edema  Musculoskeletal: No deformities, no cyanosis or clubbing  Neuro: alert, non focal  Skin: Warm, no rash    Assessment & Plan:   Vocal cord dysfunction Continue to aggressively manage contributing factors.  She reports great GERD control at this time, good allergy control as well.  Continue same  ILD (interstitial lung disease) ([place]) Noted on her CT chest 02/2020.  I suspect related to rheumatoid disease, methotrexate but I would like her to follow-up at least once with Dr. Vaughan Browner in the ILD clinic to see if any other evaluation is indicated.  She has an appointment 06/16/2020.  Intrinsic asthma She noted improvement when we changed the [facility] to [place].  Working with our clinical pharmacist to get financial assistance for this so we can continue it.  She needs a new nebulizer machine.  Continue albuterol as needed.  Baltazar Apo, MD, PhD 05/25/2020, 11:17 AM [facility] [phone] or if no answer [phone]

## 2020-05-25 NOTE — Patient Instructions (Signed)
It was a pleasure seeing you in clinic today!  Today the plan is... 1. Continue using Breztri two puffs twice daily! Remember to keep rinsing your mouth out with water after using Breztri inhaler. 2. When using spacer device with Breztri, remember, if you hear whistling that means you are breathing too fast and should slow down 3. Complete Breztri patient assistance program application and make a copy of your financial documents (social security form). Please mail or deliver complete application with social security document copy to office.  4. I will work on refill for rescue inhaler (albuterol) and will be in touch about the cost to make sure it is affordable  Please call the PharmD clinic at [phone] if you have any questions that you would like to speak with a pharmacist about Stanton Kidney, Museum/gallery conservator).

## 2020-05-25 NOTE — Assessment & Plan Note (Signed)
She noted improvement when we changed the [facility] to [place].  Working with our clinical pharmacist to get financial assistance for this so we can continue it.  She needs a new nebulizer machine.  Continue albuterol as needed.

## 2020-05-27 NOTE — Addendum Note (Signed)
Addended by: Jannette Spanner on: 05/27/2020 09:44 AM   Modules accepted: Orders

## 2020-05-28 DIAGNOSIS — J45909 Unspecified asthma, uncomplicated: Secondary | ICD-10-CM | POA: Diagnosis not present

## 2020-05-28 DIAGNOSIS — J849 Interstitial pulmonary disease, unspecified: Secondary | ICD-10-CM | POA: Diagnosis not present

## 2020-06-16 ENCOUNTER — Other Ambulatory Visit: Payer: Self-pay

## 2020-06-16 ENCOUNTER — Encounter: Payer: Self-pay | Admitting: Pulmonary Disease

## 2020-06-16 ENCOUNTER — Ambulatory Visit: Payer: Medicare Other | Admitting: Pulmonary Disease

## 2020-06-16 VITALS — BP 124/64 | HR 94 | Temp 97.6°F | Ht 62.0 in | Wt 159.0 lb

## 2020-06-16 DIAGNOSIS — I509 Heart failure, unspecified: Secondary | ICD-10-CM | POA: Diagnosis not present

## 2020-06-16 DIAGNOSIS — J849 Interstitial pulmonary disease, unspecified: Secondary | ICD-10-CM | POA: Diagnosis not present

## 2020-06-16 LAB — CBC WITH DIFFERENTIAL/PLATELET
Basophils Absolute: 0 10*3/uL (ref 0.0–0.1)
Basophils Relative: 0.5 % (ref 0.0–3.0)
Eosinophils Absolute: 0.1 10*3/uL (ref 0.0–0.7)
Eosinophils Relative: 1.4 % (ref 0.0–5.0)
HCT: 37.7 % (ref 36.0–46.0)
Hemoglobin: 12.1 g/dL (ref 12.0–15.0)
Lymphocytes Relative: 26.1 % (ref 12.0–46.0)
Lymphs Abs: 1.9 10*3/uL (ref 0.7–4.0)
MCHC: 32 g/dL (ref 30.0–36.0)
MCV: 84.8 fl (ref 78.0–100.0)
Monocytes Absolute: 0.4 10*3/uL (ref 0.1–1.0)
Monocytes Relative: 6 % (ref 3.0–12.0)
Neutro Abs: 4.8 10*3/uL (ref 1.4–7.7)
Neutrophils Relative %: 66 % (ref 43.0–77.0)
Platelets: 399 10*3/uL (ref 150.0–400.0)
RBC: 4.45 Mil/uL (ref 3.87–5.11)
RDW: 17.6 % — ABNORMAL HIGH (ref 11.5–15.5)
WBC: 7.2 10*3/uL (ref 4.0–10.5)

## 2020-06-16 MED ORDER — PREDNISONE 10 MG PO TABS
ORAL_TABLET | ORAL | 0 refills | Status: DC
Start: 2020-06-16 — End: 2020-07-08

## 2020-06-16 NOTE — Progress Notes (Signed)
[name]    [id number]    04-Apr-1946  Primary Care Physician:[name], [name] Kaufmann, MD  Referring Physician: Merrilee [name], [place],  [place] [address]  Chief complaint: Consult for dyspnea, evaluation for interstitial lung disease  HPI: 74 year old with history of asthma, COPD, essential thrombocytosis, rheumatoid arthritis, vocal cord dysfunction. Followed with Dr. Lamonte [name].  Referred to ILD clinic for evaluation of recent CT which shows mild interstitial lung disease.  Chief complaint is chronic dyspnea on exertion which has worsened over the past 6 months.  She has occasional cough, wheezing associated with this.  Symbicort was changed to press tree earlier this year with some improvement in symptoms Also has associated symptoms of allergies and acid reflux.  Other history notable for rheumatoid arthritis.  She follows with Dr. Kathlene [name] and was apparently on methotrexate at some point but not now currently.  Patient is not clear on these details. She also follows with Dr. 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  She then worked as a [employer] for [employer] until retirement in 2010 Exposures: Exposed to cleaning products include 1990.  No ongoing exposures.  Denies any mold, hot tub, Jacuzzi.  No down pillows or comforters Smoking history: 3-pack-year smoker.  Quit in 1990 Travel history: No significant travel history Relevant family history: No significant family history of lung disease  Outpatient Encounter Medications as of 06/16/2020  Medication Sig  . albuterol (PROVENTIL HFA) 108 (90 Base) MCG/ACT inhaler Inhale 2 puffs into the lungs every 4 (four) hours as needed for wheezing or shortness of breath. Reported on 12/30/2015  . albuterol (PROVENTIL) (2.5 MG/3ML) 0.083% nebulizer solution Take 3 mLs (2.5 mg total) by nebulization  every 4 (four) hours as needed for shortness of breath. Reported on 12/30/2015  . aspirin EC 81 MG tablet Take 81 mg by mouth daily.  . Budeson-Glycopyrrol-Formoterol (BREZTRI AEROSPHERE) 160-9-4.8 MCG/ACT AERO Inhale 2 puffs into the lungs in the morning and at bedtime.  . cholecalciferol (VITAMIN D) 1000 UNITS tablet Take 1,000 Units by mouth daily.  . fluticasone (FLONASE) 50 MCG/ACT nasal spray Place 1 spray into the nose 2 (two) times daily.   . folic acid (FOLVITE) 1 MG tablet Take 1 tablet (1 mg total) by mouth daily.  . hydroxyurea (HYDREA) 500 MG capsule Take 1 capsule (500 mg total) by mouth 3 (three) times daily before meals.  . Magnesium 200 MG TABS Take 1 tablet by mouth 2 (two) times daily.   . montelukast (SINGULAIR) 10 MG tablet TAKE 1 TABLET BY MOUTH EVERYDAY AT BEDTIME  . omeprazole (PRILOSEC) 20 MG capsule TAKE 1 TABLET TWICE A DAY.  . pravastatin (PRAVACHOL) 40 MG tablet Take 40 mg by mouth daily.  . benzonatate (TESSALON) 200 MG capsule Take 1 capsule (200 mg total) by mouth 3 (three) times daily as needed for cough. (Patient not taking: Reported on 06/16/2020)   Facility-Administered Encounter Medications as of 06/16/2020  Medication  . 0.9 %  sodium chloride infusion    Allergies as of 06/16/2020 - Review Complete 06/16/2020  Allergen Reaction Noted  . Penicillins Itching and Rash 05/01/2007    Past Medical History:  Diagnosis Date  . Allergic rhinitis   . Anemia   . Asthma   . Chronic bronchitis ([place])   . Colonic polyp   . DDD (degenerative disc disease), lumbar 07/20/2011  .  DJD (degenerative joint disease) of knee    bilateral  . DJD (degenerative joint disease), lumbar 07/20/2011  . Esophageal stricture   . GERD (gastroesophageal reflux disease)   . Hiatal hernia   . Hyperlipidemia   . Osteoporosis   . Stroke [facility])    "didn't know I'd had one before I was told; didn't do any damage" (11/24/2016)  . Vocal cord dysfunction     Past Surgical History:    Procedure Laterality Date  . CARPAL TUNNEL RELEASE Bilateral   . COLONOSCOPY W/ BIOPSIES AND POLYPECTOMY    . ESOPHAGOGASTRODUODENOSCOPY (EGD) WITH ESOPHAGEAL DILATION    . FOOT SURGERY Bilateral    "might have been bunions or hammertoes"  . KNEE ARTHROSCOPY Left 11/06/2013  . TOTAL ABDOMINAL HYSTERECTOMY    . VIDEO BRONCHOSCOPY Bilateral 12/12/2014   Procedure: VIDEO BRONCHOSCOPY WITHOUT FLUORO;  Surgeon: [name] Mires, MD;  Location: [facility] ENDOSCOPY;  Service: Cardiopulmonary;  Laterality: Bilateral;    Family History  Problem Relation Age of Onset  . Heart attack Mother   . Colon cancer Father 69  . Cancer Sister        unknown type  . Asthma Brother   . Asthma Other        uncle  . Stomach cancer Neg Hx   . Esophageal cancer Neg Hx   . Rectal cancer Neg Hx     Social History   Socioeconomic History  . Marital status: Widowed    Spouse name: Not on file  . Number of children: 6  . Years of education: Not on file  . Highest education level: 9th grade  Occupational History  . Occupation: retired  Tobacco Use  . Smoking status: Former Smoker    Packs/day: 0.10    Years: 30.00    Pack years: 3.00    Types: Cigarettes    Start date: 01/31/1975    Quit date: 08/09/1995    Years since quitting: 24.8  . Smokeless tobacco: Never Used  Vaping Use  . Vaping Use: Never used  Substance and Sexual Activity  . Alcohol use: No    Alcohol/week: 0.0 standard drinks  . Drug use: No  . Sexual activity: Never  Other Topics Concern  . Not on file  Social History Narrative    [name] is retired, widowed. She has support of her daughters, family, pastor and church family. She continues to drive and is independent with all her care needs  She is on social security fixed income    Social Determinants of Health   Financial Resource Strain: Medium Risk  . Difficulty of Paying Living Expenses: Somewhat hard  Food Insecurity:   . Worried About Charity fundraiser in the Last Year:    . Arboriculturist in the Last Year:   Transportation Needs: No Transportation Needs  . Lack of Transportation (Medical): No  . Lack of Transportation (Non-Medical): No  Physical Activity:   . Days of Exercise per Week:   . Minutes of Exercise per Session:   Stress: No Stress Concern Present  . Feeling of Stress : Not at all  Social Connections:   . Frequency of Communication with Friends and Family:   . Frequency of Social Gatherings with Friends and Family:   . Attends Religious Services:   . Active Member of Clubs or Organizations:   . Attends Archivist Meetings:   Marland Kitchen Marital Status:   Intimate Partner Violence:   . Fear of Current or  Ex-Partner:   . Emotionally Abused:   Marland Kitchen Physically Abused:   . Sexually Abused:     Review of systems: Review of Systems  Constitutional: Negative for fever and chills.  HENT: Negative.   Eyes: Negative for blurred vision.  Respiratory: as per HPI  Cardiovascular: Negative for chest pain and palpitations.  Gastrointestinal: Negative for vomiting, diarrhea, blood per rectum. Genitourinary: Negative for dysuria, urgency, frequency and hematuria.  Musculoskeletal: Negative for myalgias, back pain and joint pain.  Skin: Negative for itching and rash.  Neurological: Negative for dizziness, tremors, focal weakness, seizures and loss of consciousness.  Endo/Heme/Allergies: Negative for environmental allergies.  Psychiatric/Behavioral: Negative for depression, suicidal ideas and hallucinations.  All other systems reviewed and are negative.  Physical Exam: Blood pressure 124/64, pulse 94, temperature 97.6 F (36.4 C), temperature source Oral, height 5\' 2"  (1.575 m), weight 159 lb (72.1 kg), SpO2 100 %. Gen:      No acute distress HEENT:  EOMI, sclera anicteric Neck:     No masses; no thyromegaly Lungs:    Bilateral rhonchi with diffuse wheeze. CV:         Regular rate and rhythm; no murmurs Abd:      + bowel sounds; soft, non-tender;  no palpable masses, no distension Ext:    No edema; adequate peripheral perfusion Skin:      Warm and dry; no rash Neuro: alert and oriented x 3 Psych: normal mood and affect  Data Reviewed: Imaging: High-resolution 02/18/2020-patchy groundglass with septal thickening, mild reticulation with bronchiectasis in the upper lobes near the apices.  Emphysema. CT chest 05/20/2020-moderate emphysema with bronchial wall thickening.  Mild interstitial changes as noted above I have reviewed the images personally.  PFTs: 02/03/2017 FVC 2.44 [110%], FEV1 1.48 [86%], F/F 61, TLC 6.30 [120%], DLCO 20.71 [90%] Mild obstruction with bronchodilator response  04/24/2020 FVC 1.76 [82%], FEV1 0.90 [55%], F/F 51, TLC 4.67 [95%], DLCO 15.44 [83%] Severe obstruction  Labs: CBC 05/19/2020-WBC 6, eos 1%, absolute eosinophil count 60 IgE 06/12/2017-9  Assessment:  Evaluation for interstitial lung disease She does have history of rheumatoid arthritis and was on methotrexate in the past but not currently. I will get records from her rheumatologist for a better idea of her treatment to date and disease severity  Her CT scan does show very mild changes in the upper lobes which does not appear very impressive.  There is a question of hypersensitivity pneumonitis but she does not have any exposures We will further evaluate with CTD serologies and hypersensitivity panel.  She has been given an ILD questionnaire for exposures evaluation.  At present I do not believe she needs any specific treatment for ILD unless there is progression Overall I think her dyspnea is more related to airway disease than interstitial lung disease as she has marked wheezing today on examination  Asthma, COPD Recently changed to breztri per Dr. Lamonte [name] with improvement Give short prednisone taper 40 mg/day for 5 days as she is wheezing in the office Check CBC with differential, IgE, N-terminal proBNP.  Check alpha-1 antitrypsin as severity of  obstruction appears out of proportion to smoking history  Plan/Recommendations: - Check CTD serologies, ILD questionnaire - Prednisone 40 mg/day for 5 days.  Marshell Garfinkel MD [facility] 06/16/2020, 8:33 AM  CC: [name] Seashore, MD

## 2020-06-16 NOTE — Patient Instructions (Addendum)
We will get some labs today including CBC differential, IgE, ILD panel including hypersensitivity panel, N-terminal proBNP We will give a questionnaire to check you for exposures Prednisone 40 mg a day for 5 days  We will also get records from Dr. Kathlene November your rheumatologist to review management of rheumatoid arthritis  Continue the inhalers as prescribed Follow-up in 1 month

## 2020-06-17 ENCOUNTER — Other Ambulatory Visit: Payer: Self-pay

## 2020-06-17 MED ORDER — BREZTRI AEROSPHERE 160-9-4.8 MCG/ACT IN AERO: 2.0000 | INHALATION_SPRAY | Freq: Two times a day (BID) | RESPIRATORY_TRACT | 11 refills | Status: DC

## 2020-06-19 LAB — HYPERSENSITIVITY PNEUMONITIS
A. Pullulans Abs: NEGATIVE
A.Fumigatus #1 Abs: NEGATIVE
Micropolyspora faeni, IgG: NEGATIVE
Pigeon Serum Abs: NEGATIVE
Thermoact. Saccharii: NEGATIVE
Thermoactinomyces vulgaris, IgG: NEGATIVE

## 2020-06-19 LAB — PRO B NATRIURETIC PEPTIDE: NT-Pro BNP: 102 pg/mL (ref 0–738)

## 2020-06-24 LAB — ANTI-SCLERODERMA ANTIBODY: Scleroderma (Scl-70) (ENA) Antibody, IgG: <1 AI

## 2020-06-24 LAB — ANA,IFA RA DIAG PNL W/RFLX TIT/PATN
Anti Nuclear Antibody (ANA): POSITIVE — AB
Cyclic Citrullin Peptide Ab: <16 UNITS
Rheumatoid fact SerPl-aCnc: <14 IU/mL (ref <14)

## 2020-06-24 LAB — ANCA SCREEN W REFLEX TITER: ANCA Screen: NEGATIVE

## 2020-06-24 LAB — ALPHA-1 ANTITRYPSIN PHENOTYPE: A-1 Antitrypsin, Ser: 170 mg/dL (ref 83–199)

## 2020-06-24 LAB — ANTI-NUCLEAR AB-TITER (ANA TITER): ANA Titer 1: 1:40 {titer} — ABNORMAL HIGH

## 2020-06-24 LAB — SJOGREN'S SYNDROME ANTIBODS(SSA + SSB)
SSA (Ro) (ENA) Antibody, IgG: <1 AI
SSB (La) (ENA) Antibody, IgG: <1 AI

## 2020-06-24 LAB — IGE: IgE (Immunoglobulin E), Serum: 15 kU/L (ref <=114)

## 2020-06-25 ENCOUNTER — Other Ambulatory Visit: Payer: Self-pay | Admitting: *Deleted

## 2020-06-25 NOTE — Patient Outreach (Addendum)
[facility]) Care Management  06/25/2020  [name] Aug 21, 1946 [id number]  Unsuccessful outreach attempt made to patient. HIPAA  identifiers obtained. Patient answered the phone and explained that she has been experiencing a COPD exacerbation. She recently saw Pulmonary 06/16/20 and was prescribed a prednisone taper. She reports that she is slightly better and is using her nebulizer and rescue inhaler as needed. Patient states she cannot talk today because when she talks for long periods she begins to cough. Patient reports that she is in close contact with her pulmonologist and will call them if she is not better soon or if needed will go to the ED.  Plan: RN Health Coach will send COPD education, will call patient within the month of August, and patient agrees to future outreach calls.   Emelia Loron RN, BSN [place] [phone] [email or web address]

## 2020-06-30 LAB — MYOMARKER 3 PLUS PROFILE (RDL)

## 2020-07-02 ENCOUNTER — Inpatient Hospital Stay: Payer: Medicare Other | Admitting: Hematology & Oncology

## 2020-07-02 ENCOUNTER — Inpatient Hospital Stay: Payer: Medicare Other

## 2020-07-02 ENCOUNTER — Telehealth: Payer: Self-pay | Admitting: Emergency Medicine

## 2020-07-02 MED ORDER — PREDNISONE 10 MG PO TABS: ORAL_TABLET | ORAL | 0 refills | Status: AC

## 2020-07-02 NOTE — Telephone Encounter (Signed)
Called and spoke with patient she states that she is having increased shortness of breath, dry cough that started on Tuesday. On 05/25/20 she was switched to [facility] by Dr. Lamonte Sakai and at last visit on 06/16/20 with Dr. Vaughan Browner she was given Prednisone taper and has since finished that.   Please advise on recommendations for patient. She states she does not have the money for the copay to come in and be seen.

## 2020-07-02 NOTE — Telephone Encounter (Signed)
Thanks Rob.  She has follow-up with you on 8/23

## 2020-07-02 NOTE — Telephone Encounter (Signed)
I am OK giving her a more extended pred taper:  Take 40mg  daily for 3 days, then 30mg  daily for 3 days, then 20mg  daily for 3 days, then 10mg  daily for 3 days, then stop  Have her call us to let us know how she improves. I'll need to see her if she is no better.

## 2020-07-02 NOTE — Telephone Encounter (Signed)
Called and spoke with patient letting her know that we are going to send in a Prednisone taper to her pharmacy. Informed patient to let us know is she feels better or worse with taper and if she is not feeling any better then she would need to come in to be seen. Patient expressed understanding. Nothing further needed at this time.

## 2020-07-03 ENCOUNTER — Other Ambulatory Visit: Payer: Self-pay | Admitting: Pharmacy Technician

## 2020-07-03 NOTE — Patient Outreach (Signed)
[facility])  07/03/2020  [name] [birth year] [id number]   Have not received patient portion of patient assistance applications. Will close patient at this time.  Maud Deed Chana Bode, [place] Certified Pharmacy Technician [employer]

## 2020-07-06 ENCOUNTER — Emergency Department (HOSPITAL_COMMUNITY)
Admission: EM | Admit: 2020-07-06 | Discharge: 2020-07-07 | Disposition: A | Discharge status: Home / Self Care | Payer: Medicare Other | Attending: Emergency Medicine | Admitting: Emergency Medicine

## 2020-07-06 ENCOUNTER — Inpatient Hospital Stay: Payer: Medicare Other | Attending: Hematology & Oncology

## 2020-07-06 ENCOUNTER — Inpatient Hospital Stay (HOSPITAL_BASED_OUTPATIENT_CLINIC_OR_DEPARTMENT_OTHER): Payer: Medicare Other | Admitting: Hematology & Oncology

## 2020-07-06 ENCOUNTER — Encounter: Payer: Self-pay | Admitting: Hematology & Oncology

## 2020-07-06 ENCOUNTER — Telehealth: Payer: Self-pay | Admitting: Emergency Medicine

## 2020-07-06 ENCOUNTER — Other Ambulatory Visit: Payer: Self-pay

## 2020-07-06 ENCOUNTER — Emergency Department (HOSPITAL_COMMUNITY): Payer: Medicare Other

## 2020-07-06 VITALS — BP 127/65 | HR 87 | Temp 97.8°F | Resp 20 | Ht 62.0 in | Wt 168.8 lb

## 2020-07-06 DIAGNOSIS — R05 Cough: Secondary | ICD-10-CM | POA: Insufficient documentation

## 2020-07-06 DIAGNOSIS — Z79899 Other long term (current) drug therapy: Secondary | ICD-10-CM | POA: Insufficient documentation

## 2020-07-06 DIAGNOSIS — Z7982 Long term (current) use of aspirin: Secondary | ICD-10-CM | POA: Diagnosis not present

## 2020-07-06 DIAGNOSIS — J45901 Unspecified asthma with (acute) exacerbation: Secondary | ICD-10-CM

## 2020-07-06 DIAGNOSIS — R0989 Other specified symptoms and signs involving the circulatory and respiratory systems: Secondary | ICD-10-CM | POA: Insufficient documentation

## 2020-07-06 DIAGNOSIS — R0789 Other chest pain: Secondary | ICD-10-CM | POA: Diagnosis not present

## 2020-07-06 DIAGNOSIS — E119 Type 2 diabetes mellitus without complications: Secondary | ICD-10-CM | POA: Insufficient documentation

## 2020-07-06 DIAGNOSIS — Z20822 Contact with and (suspected) exposure to covid-19: Secondary | ICD-10-CM | POA: Diagnosis not present

## 2020-07-06 DIAGNOSIS — Z87891 Personal history of nicotine dependence: Secondary | ICD-10-CM | POA: Diagnosis not present

## 2020-07-06 DIAGNOSIS — R0602 Shortness of breath: Secondary | ICD-10-CM | POA: Diagnosis not present

## 2020-07-06 DIAGNOSIS — R42 Dizziness and giddiness: Secondary | ICD-10-CM | POA: Diagnosis not present

## 2020-07-06 DIAGNOSIS — K449 Diaphragmatic hernia without obstruction or gangrene: Secondary | ICD-10-CM | POA: Diagnosis not present

## 2020-07-06 DIAGNOSIS — R911 Solitary pulmonary nodule: Secondary | ICD-10-CM | POA: Diagnosis not present

## 2020-07-06 DIAGNOSIS — D473 Essential (hemorrhagic) thrombocythemia: Secondary | ICD-10-CM

## 2020-07-06 DIAGNOSIS — R899 Unspecified abnormal finding in specimens from other organs, systems and tissues: Secondary | ICD-10-CM

## 2020-07-06 DIAGNOSIS — R062 Wheezing: Secondary | ICD-10-CM | POA: Insufficient documentation

## 2020-07-06 LAB — CMP (CANCER CENTER ONLY)
ALT: 16 U/L (ref 0–44)
AST: 10 U/L — ABNORMAL LOW (ref 15–41)
Albumin: 3.6 g/dL (ref 3.5–5.0)
Alkaline Phosphatase: 51 U/L (ref 38–126)
Anion gap: 6 (ref 5–15)
BUN: 17 mg/dL (ref 8–23)
CO2: 30 mmol/L (ref 22–32)
Calcium: 8.9 mg/dL (ref 8.9–10.3)
Chloride: 104 mmol/L (ref 98–111)
Creatinine: 0.87 mg/dL (ref 0.44–1.00)
GFR, Est AFR Am: >60 mL/min (ref >60)
GFR, Estimated: >60 mL/min (ref >60)
Glucose, Bld: 95 mg/dL (ref 70–99)
Potassium: 3.7 mmol/L (ref 3.5–5.1)
Sodium: 140 mmol/L (ref 135–145)
Total Bilirubin: 0.4 mg/dL (ref 0.3–1.2)
Total Protein: 5.4 g/dL — ABNORMAL LOW (ref 6.5–8.1)

## 2020-07-06 LAB — HEPATIC FUNCTION PANEL
ALT: 22 U/L (ref 0–44)
AST: 19 U/L (ref 15–41)
Albumin: 3.3 g/dL — ABNORMAL LOW (ref 3.5–5.0)
Alkaline Phosphatase: 54 U/L (ref 38–126)
Bilirubin, Direct: 0.1 mg/dL (ref 0.0–0.2)
Indirect Bilirubin: 0.3 mg/dL (ref 0.3–0.9)
Total Bilirubin: 0.4 mg/dL (ref 0.3–1.2)
Total Protein: 5.8 g/dL — ABNORMAL LOW (ref 6.5–8.1)

## 2020-07-06 LAB — CBC WITH DIFFERENTIAL (CANCER CENTER ONLY)
Abs Immature Granulocytes: 0.16 10*3/uL — ABNORMAL HIGH (ref 0.00–0.07)
Basophils Absolute: 0 10*3/uL (ref 0.0–0.1)
Basophils Relative: 0 %
Eosinophils Absolute: 0 10*3/uL (ref 0.0–0.5)
Eosinophils Relative: 1 %
HCT: 35.4 % — ABNORMAL LOW (ref 36.0–46.0)
Hemoglobin: 11.4 g/dL — ABNORMAL LOW (ref 12.0–15.0)
Immature Granulocytes: 2 %
Lymphocytes Relative: 26 %
Lymphs Abs: 2.3 10*3/uL (ref 0.7–4.0)
MCH: 27.1 pg (ref 26.0–34.0)
MCHC: 32.2 g/dL (ref 30.0–36.0)
MCV: 84.3 fL (ref 80.0–100.0)
Monocytes Absolute: 0.6 10*3/uL (ref 0.1–1.0)
Monocytes Relative: 7 %
Neutro Abs: 5.7 10*3/uL (ref 1.7–7.7)
Neutrophils Relative %: 64 %
Platelet Count: 318 10*3/uL (ref 150–400)
RBC: 4.2 MIL/uL (ref 3.87–5.11)
RDW: 19.1 % — ABNORMAL HIGH (ref 11.5–15.5)
WBC Count: 8.9 10*3/uL (ref 4.0–10.5)
nRBC: 0 % (ref 0.0–0.2)

## 2020-07-06 LAB — BASIC METABOLIC PANEL
Anion gap: 11 (ref 5–15)
BUN: 16 mg/dL (ref 8–23)
CO2: 24 mmol/L (ref 22–32)
Calcium: 8.4 mg/dL — ABNORMAL LOW (ref 8.9–10.3)
Chloride: 103 mmol/L (ref 98–111)
Creatinine, Ser: 0.92 mg/dL (ref 0.44–1.00)
GFR calc Af Amer: >60 mL/min (ref >60)
GFR calc non Af Amer: >60 mL/min (ref >60)
Glucose, Bld: 196 mg/dL — ABNORMAL HIGH (ref 70–99)
Potassium: 4.6 mmol/L (ref 3.5–5.1)
Sodium: 138 mmol/L (ref 135–145)

## 2020-07-06 LAB — LACTATE DEHYDROGENASE: LDH: 280 U/L — ABNORMAL HIGH (ref 98–192)

## 2020-07-06 LAB — BRAIN NATRIURETIC PEPTIDE: B Natriuretic Peptide: 30.5 pg/mL (ref 0.0–100.0)

## 2020-07-06 LAB — SARS CORONAVIRUS 2 BY RT PCR (HOSPITAL ORDER, PERFORMED IN ~~LOC~~ HOSPITAL LAB): SARS Coronavirus 2: NEGATIVE

## 2020-07-06 LAB — TROPONIN I (HIGH SENSITIVITY)
Troponin I (High Sensitivity): 4 ng/L (ref <18)
Troponin I (High Sensitivity): 5 ng/L (ref <18)

## 2020-07-06 LAB — TSH: TSH: 0.921 u[IU]/mL (ref 0.308–3.960)

## 2020-07-06 MED ORDER — METHYLPREDNISOLONE SODIUM SUCC 125 MG IJ SOLR
125.0000 mg | Freq: Once | INTRAMUSCULAR | Status: AC
  Administered 2020-07-06: 125 mg via INTRAVENOUS
  Filled 2020-07-06: qty 2

## 2020-07-06 MED ORDER — IPRATROPIUM BROMIDE 0.02 % IN SOLN
0.5000 mg | Freq: Once | RESPIRATORY_TRACT | Status: AC
  Administered 2020-07-06: 0.5 mg via RESPIRATORY_TRACT
  Filled 2020-07-06 (×2): qty 2.5

## 2020-07-06 MED ORDER — ALBUTEROL SULFATE HFA 108 (90 BASE) MCG/ACT IN AERS
8.0000 | INHALATION_SPRAY | Freq: Once | RESPIRATORY_TRACT | Status: AC
  Administered 2020-07-06: 8 via RESPIRATORY_TRACT
  Filled 2020-07-06: qty 6.7

## 2020-07-06 MED ORDER — ALBUTEROL (5 MG/ML) CONTINUOUS INHALATION SOLN
10.0000 mg/h | INHALATION_SOLUTION | Freq: Once | RESPIRATORY_TRACT | Status: AC
  Administered 2020-07-06: 10 mg/h via RESPIRATORY_TRACT
  Filled 2020-07-06 (×2): qty 20

## 2020-07-06 MED ORDER — MAGNESIUM SULFATE 2 GM/50ML IV SOLN
2.0000 g | Freq: Once | INTRAVENOUS | Status: AC
  Administered 2020-07-06: 2 g via INTRAVENOUS
  Filled 2020-07-06: qty 50

## 2020-07-06 MED ORDER — IPRATROPIUM BROMIDE HFA 17 MCG/ACT IN AERS
2.0000 | INHALATION_SPRAY | Freq: Once | RESPIRATORY_TRACT | Status: AC
  Administered 2020-07-06: 2 via RESPIRATORY_TRACT
  Filled 2020-07-06: qty 12.9

## 2020-07-06 NOTE — ED Provider Notes (Addendum)
  Physical Exam  BP 123/76 (BP Location: Right Arm)   Pulse 68   Temp 97.6 F (36.4 C) (Oral)   Resp 15   Ht 5\' 2"  (1.575 m)   Wt 75.3 kg   SpO2 100%   BMI 30.36 kg/m   Physical Exam  ED Course/Procedures   Clinical Course as of Jul 07 18  Mon Jul 06, 2020  1802 1. No acute cardiopulmonary abnormality. 2. Chronically coarsened interstitial changes compatible with features of interstitial lung disease seen on comparison cross-sectional imaging. 3. Hiatal hernia. 4. Aortic Atherosclerosis (ICD10-I70.0).   DG Chest Port 1 View [CG]  [phone] Sinus rhythm Low voltage, precordial leads Abnormal R-wave progression, early transition Confirmed by Lennice Sites [phone]) on 07/06/2020 5:44:58 PM  ED EKG [CG]  2053 Re-evaluated patient.  No respiratory distress. Speaking in full sentences.  Continued but improved wheezing/rhonchi.    [CG]    Clinical Course User Index [CG] Kinnie Feil, PA-C    Procedures  MDM  Patient care assumed from [place]. PA at shift change, please see her note for a full HPI. Briefly, patient with interstitial lung disease here with wheezing, cough with clear sputum. Patient is closely followed by Pulmonology, has an appointment scheduled in 2 days. Plan is for breathing treatment, reassess and likely disposition home.  Patient has received ipratropium, mag, solumedrol and now breathing treatment.   BP 123/76 (BP Location: Right Arm)   Pulse 68   Temp 97.6 F (36.4 C) (Oral)   Resp 15   Ht 5\' 2"  (1.575 m)   Wt 75.3 kg   SpO2 100%   BMI 30.36 kg/m   12:12 AM patient was ambulated by nursing staff, Maralyn Sago NT reports patient's O2 did not drop during ambulation and HR remained without tachycardia.  I have personally reassessed patient, she does report improvement in her symptoms although feels that wheezing has slightly persisted, she does have a current schedule appointment with pulmonology in the next 2 days.  Patient is requesting discharge  home at this time as she feels that her eating has had some improvement.  Vitals remained stable, patient's heart rate around the low 90s during my conversation, stats that 100% on room air, speaking in full sentences without any audible wheezing noted by me.  Return precautions discussed at length, patient stable for discharge.   Portions of this note were generated with Lobbyist. Dictation errors may occur despite best attempts at proofreading.       Janeece Fitting, PA-C 07/07/20 0015    Janeece Fitting, PA-C 07/07/20 0019    Virgel Manifold, MD 07/07/20 [phone]

## 2020-07-06 NOTE — Telephone Encounter (Signed)
Pt returned call , please call back   °

## 2020-07-06 NOTE — Telephone Encounter (Signed)
Returned call to patient, had to leave a message.

## 2020-07-06 NOTE — Telephone Encounter (Signed)
Spoke with patient, she states she has has a couple of days of cough, deep with chest congestion, tightness in chest and sob.  States her oxygen level is fine, she went to her pcp today and was told to call our office.  She states she took her last dose of prednisone today and she is no better.  Dr. Lamonte Sakai, Please advise.

## 2020-07-06 NOTE — Telephone Encounter (Signed)
Spoke with pt  I have notified her of Dr Agustina Caroli response  She verbalized understanding  Appt with MW scheduled for 07/08/20  She is aware to seek emergent care sooner if needed

## 2020-07-06 NOTE — Progress Notes (Signed)
Hematology and Oncology Follow Up Visit  [name] [id number] [birth year] 74 y.o. 07/06/2020   Principle Diagnosis:  Essential thrombocythemia - JAK2 POSITIVE Sickle cell trait  Current Therapy:   Hydrea 500 mg PO TID - dose changed on 96/22/2979 Folic acid 1 mg by mouth daily Aspirin 81 mg daily    Interim History:  [name] is here today for follow-up.  Her problem right now is her asthma is getting worse.  She is having a flareup of her asthma.  The hot humid air from last week really bothered her.  She is on a prednisone taper.  She says this was not helping all that much.  She is not coughing up anything.  She has had no fever.  She has had no hemoptysis.  She will see about seeing her family doctor either today or tomorrow.  She is doing well with the Hydrea.  She is having no problems with nausea or vomiting from this.  She is having no rashes.  There is been no leg swelling.  She has had no issues with bowels or bladder.  We did do a CT scan on her in June.  The CT scan of the chest/abdomen/pelvis did not show any issues with respect to cancer.  Overall, I would say her performance status is ECOG 1.    Medications:  Allergies as of 07/06/2020      Reactions   Penicillins Itching, Rash      Medication List       Accurate as of July 06, 2020  9:53 AM. If you have any questions, ask your nurse or doctor.        albuterol (2.5 MG/3ML) 0.083% nebulizer solution Commonly known as: PROVENTIL Take 3 mLs (2.5 mg total) by nebulization every 4 (four) hours as needed for shortness of breath. Reported on 12/30/2015   albuterol 108 (90 Base) MCG/ACT inhaler Commonly known as: Proventil HFA Inhale 2 puffs into the lungs every 4 (four) hours as needed for wheezing or shortness of breath. Reported on 12/30/2015   aspirin EC 81 MG tablet Take 81 mg by mouth daily.   benzonatate 200 MG capsule Commonly known as: TESSALON Take 1 capsule (200 mg total) by mouth 3  (three) times daily as needed for cough.   Breztri Aerosphere 160-9-4.8 MCG/ACT Aero Generic drug: Budeson-Glycopyrrol-Formoterol Inhale 2 puffs into the lungs in the morning and at bedtime.   cholecalciferol 1000 units tablet Commonly known as: VITAMIN D Take 1,000 Units by mouth daily.   fluticasone 50 MCG/ACT nasal spray Commonly known as: FLONASE Place 1 spray into the nose 2 (two) times daily.   folic acid 1 MG tablet Commonly known as: FOLVITE Take 1 tablet (1 mg total) by mouth daily.   hydroxyurea 500 MG capsule Commonly known as: HYDREA Take 1 capsule (500 mg total) by mouth 3 (three) times daily before meals.   Magnesium 200 MG Tabs Take 1 tablet by mouth 2 (two) times daily.   montelukast 10 MG tablet Commonly known as: SINGULAIR TAKE 1 TABLET BY MOUTH EVERYDAY AT BEDTIME   omeprazole 20 MG capsule Commonly known as: PRILOSEC TAKE 1 TABLET TWICE A DAY.   pravastatin 40 MG tablet Commonly known as: PRAVACHOL Take 40 mg by mouth daily.   predniSONE 10 MG tablet Commonly known as: DELTASONE Take 40mg  daily x's 5 days   predniSONE 10 MG tablet Commonly known as: DELTASONE Take 4 tablets (40 mg total) by mouth daily with breakfast for 3  days, THEN 3 tablets (30 mg total) daily with breakfast for 3 days, THEN 2 tablets (20 mg total) daily with breakfast for 3 days, THEN 1 tablet (10 mg total) daily with breakfast for 3 days. Start taking on: July 02, 2020       Allergies:  Allergies  Allergen Reactions  . Penicillins Itching and Rash    Past Medical History, Surgical history, Social history, and Family History were reviewed and updated.  Review of Systems:    Physical Exam:  height is 5\' 2"  (1.575 m) and weight is 168 lb 12.8 oz (76.6 kg). Her oral temperature is 97.8 F (36.6 C). Her blood pressure is 127/65 and her pulse is 87. Her respiration is 20 and oxygen saturation is 100%.   Wt Readings from Last 3 Encounters:  07/06/20 168 lb 12.8 oz  (76.6 kg)  06/16/20 159 lb (72.1 kg)  05/25/20 160 lb 6.4 oz (72.8 kg)       Lab Results  Component Value Date   WBC 8.9 07/06/2020   HGB 11.4 (L) 07/06/2020   HCT 35.4 (L) 07/06/2020   MCV 84.3 07/06/2020   PLT 318 07/06/2020   Lab Results  Component Value Date   FERRITIN 168 05/19/2020   IRON 69 05/19/2020   TIBC 215 (L) 05/19/2020   UIBC 146 05/19/2020   IRONPCTSAT 32 05/19/2020   Lab Results  Component Value Date   RETICCTPCT 0.8 06/20/2019   RBC 4.20 07/06/2020   RETICCTABS 73.1 08/06/2015   No results found for: KPAFRELGTCHN, LAMBDASER, KAPLAMBRATIO No results found for: IGGSERUM, IGA, IGMSERUM No results found for: Odetta Pink, SPEI   Chemistry      Component Value Date/Time   NA 140 07/06/2020 0853   NA 145 11/24/2017 0754   NA 142 05/25/2017 0755   K 3.7 07/06/2020 0853   K 3.7 11/24/2017 0754   K 3.9 05/25/2017 0755   CL 104 07/06/2020 0853   CL 104 11/24/2017 0754   CO2 30 07/06/2020 0853   CO2 27 11/24/2017 0754   CO2 24 05/25/2017 0755   BUN 17 07/06/2020 0853   BUN 12 11/24/2017 0754   BUN 13.3 05/25/2017 0755   CREATININE 0.87 07/06/2020 0853   CREATININE 1.0 11/24/2017 0754   CREATININE 0.8 05/25/2017 0755      Component Value Date/Time   CALCIUM 8.9 07/06/2020 0853   CALCIUM 9.1 11/24/2017 0754   CALCIUM 9.5 05/25/2017 0755   ALKPHOS 51 07/06/2020 0853   ALKPHOS 70 11/24/2017 0754   ALKPHOS 74 05/25/2017 0755   AST 10 (L) 07/06/2020 0853   AST 21 05/25/2017 0755   ALT 16 07/06/2020 0853   ALT 18 11/24/2017 0754   ALT 14 05/25/2017 0755   BILITOT 0.4 07/06/2020 0853   BILITOT 0.37 05/25/2017 0755       Impression and Plan: [name] is a very pleasant 74 yo African American female with essential thrombocythemia, that is JAK2 positive.  In addition, she has the sickle cell trait.  I hope that she would not have to be in the hospital.  Her lungs really sound rough today.   She has a lot of congestion bilaterally.  She has lateral rhonchi bilaterally.  We will plan to see her back here in 2 months.  The thrombocythemia is doing quite well.  Her platelet count is gradually coming down which is nice to see.        [name], MD  8/2/20219:53 AM

## 2020-07-06 NOTE — Telephone Encounter (Signed)
She will need to be seen as soon as we can fit her in - we've treated with prednisone and she is still symptomatic. Cannot trouble shoot further over the phone.

## 2020-07-06 NOTE — ED Provider Notes (Signed)
Medical screening examination/treatment/procedure(s) were conducted as a shared visit with non-physician practitioner(s) and myself.  I personally evaluated the patient during the encounter. Briefly, the patient is a 74 y.o. female with asthma, high cholesterol presents the ED with shortness of breath.  Unremarkable vitals except for tachycardia and tachypnea.  Mildly increased work of breathing, wheezing throughout with diminished air movement.  No signs of volume overload on exam.  Just finished steroid taper with not much improvement.  Has been using home inhaler with some relief.  She appears to have ongoing asthma exacerbation.  She is unable to ambulate long distances to check for any hypoxia with exertion.  She has had a change in her sputum production but denies any fever.  No history of PE or DVT.  Patient has had both coronavirus and coronavirus vaccine.  Will give albuterol and Atrovent.  Will give magnesium as well as IV steroids.  Will get lab work and chest x-ray.  Denies any chest pain.  Anticipate admission and when able will start continuous breathing treatments.  This chart was dictated using voice recognition software.  Despite best efforts to proofread,  errors can occur which can change the documentation meaning.     EKG Interpretation None           Lennice Sites, DO 07/06/20 1735

## 2020-07-06 NOTE — ED Triage Notes (Signed)
Arrived POV from home. Patient reports SHOB and asthma exacerbation. Patient states she has used her inhaler and nebulizer treatment but it has not helped. Patient states she has been taking prednisone also but she does not seem to be getting better. Patient is extremely SOB at rest

## 2020-07-06 NOTE — ED Provider Notes (Signed)
[facility] DEPT Provider Note   CSN: [id number] Arrival date & time: 07/06/20  1638     History Chief Complaint  Patient presents with  . Asthma Exacerbation  . Shortness of Breath    [name] is a 74 y.o. female with history of interstitial lung disease, asthma, remote tobacco use presents to the ED for evaluation of shortness of breath for the last 2 weeks.  It is constant but worse with exertion.  Associated with wheezing, cough with clear sputum, substernal chest pain that is worse with coughing and dizziness.  Patient feels like walking any distance will make her feel very winded and feel like she is going to faint.  She is closely followed by pulmonology who prescribed her a steroid taper but states this is not helping.  Has been compliant with 2 of her inhalers and nebulizing treatments at home that also did not help.  Had COVID-19 6 months ago.  She has since been vaccinated as well.  Denies any fever, congestion, sore throat.  No vomiting, no diarrhea.  No leg swelling or calf pain.  Denies history of blood clots.  HPI     Past Medical History:  Diagnosis Date  . Allergic rhinitis   . Anemia   . Asthma   . Chronic bronchitis ([place])   . Colonic polyp   . DDD (degenerative disc disease), lumbar 07/20/2011  . DJD (degenerative joint disease) of knee    bilateral  . DJD (degenerative joint disease), lumbar 07/20/2011  . Esophageal stricture   . GERD (gastroesophageal reflux disease)   . Hiatal hernia   . Hyperlipidemia   . Osteoporosis   . Stroke [facility])    "didn't know I'd had one before I was told; didn't do any damage" (11/24/2016)  . Vocal cord dysfunction     Patient Active Problem List   Diagnosis Date Noted  . On methotrexate therapy 07/10/2018  . Abnormal laboratory test 03/29/2018  . Sickle cell trait ([place]) 01/20/2017  . JAK-2 gene mutation 12/06/2016  . AKI (acute kidney injury) ([place]) 11/24/2016  . Right shoulder pain  11/24/2016  . Acute respiratory distress 11/24/2016  . RLS (restless legs syndrome) 11/24/2016  . Restless leg syndrome 10/13/2016  . Arthritis of knee, degenerative 02/18/2015  . Shingles 12/19/2014  . ILD (interstitial lung disease) ([place])   . Atelectasis   . Essential thrombocythemia ([place]) 12/24/2013  . History of arthroscopy of knee 11/11/2013  . TIA (transient ischemic attack) 09/01/2012  . Chest pain 09/01/2012  . Arm numbness left 06/03/2012  . Migraine 05/31/2012  . Dyspnea on exertion 08/09/2011  . DDD (degenerative disc disease), lumbar 07/20/2011  . DJD (degenerative joint disease), lumbar 07/20/2011  . CVA (cerebral infarction) 07/20/2011  . Acute lumbar radiculopathy 07/12/2011  . Preventative health care 07/10/2011  . TRIGGER FINGER, RIGHT MIDDLE 01/11/2011  . Other dysphagia 01/11/2011  . MENOPAUSAL DISORDER 07/05/2010  . OSTEOARTHRITIS, KNEES, BILATERAL 07/05/2010  . NEVUS 05/20/2010  . VAGINITIS 03/23/2010  . SUPERFICIAL THROMBOPHLEBITIS 03/20/2009  . FATIGUE 03/20/2009  . DYSPHAGIA UNSPECIFIED 02/16/2009  . Snoring 11/05/2008  . HERPES ZOSTER 06/30/2008  . Diabetes ([place]) 06/30/2008  . Hyperlipidemia 06/30/2008  . ESOPHAGEAL STRICTURE 06/30/2008  . FOOT PAIN, RIGHT 06/30/2008  . OSTEOPOROSIS 06/30/2008  . COLONIC POLYPS, HX OF 06/30/2008  . PANIC ATTACK 09/05/2007  . Allergic rhinitis 09/05/2007  . Vocal cord dysfunction 09/05/2007  . Intrinsic asthma 09/05/2007  . GERD (gastroesophageal reflux disease) 09/05/2007  Past Surgical History:  Procedure Laterality Date  . CARPAL TUNNEL RELEASE Bilateral   . COLONOSCOPY W/ BIOPSIES AND POLYPECTOMY    . ESOPHAGOGASTRODUODENOSCOPY (EGD) WITH ESOPHAGEAL DILATION    . FOOT SURGERY Bilateral    "might have been bunions or hammertoes"  . KNEE ARTHROSCOPY Left 11/06/2013  . TOTAL ABDOMINAL HYSTERECTOMY    . VIDEO BRONCHOSCOPY Bilateral 12/12/2014   Procedure: VIDEO BRONCHOSCOPY WITHOUT FLUORO;  Surgeon: Chesley Mires, MD;  Location: [facility] ENDOSCOPY;  Service: Cardiopulmonary;  Laterality: Bilateral;     OB History   No obstetric history on file.     Family History  Problem Relation Age of Onset  . Heart attack Mother   . Colon cancer Father 9  . Cancer Sister        unknown type  . Asthma Brother   . Asthma Other        uncle  . Stomach cancer Neg Hx   . Esophageal cancer Neg Hx   . Rectal cancer Neg Hx     Social History   Tobacco Use  . Smoking status: Former Smoker    Packs/day: 0.10    Years: 30.00    Pack years: 3.00    Types: Cigarettes    Start date: 01/31/1975    Quit date: 08/09/1995    Years since quitting: 24.9  . Smokeless tobacco: Never Used  Vaping Use  . Vaping Use: Never used  Substance Use Topics  . Alcohol use: No    Alcohol/week: 0.0 standard drinks  . Drug use: No    Home Medications Prior to Admission medications   Medication Sig Start Date End Date Taking? Authorizing Provider  albuterol (PROVENTIL HFA) 108 (90 Base) MCG/ACT inhaler Inhale 2 puffs into the lungs every 4 (four) hours as needed for wheezing or shortness of breath. Reported on 12/30/2015 05/25/20  Yes Collene Gobble, MD  albuterol (PROVENTIL) (2.5 MG/3ML) 0.083% nebulizer solution Take 3 mLs (2.5 mg total) by nebulization every 4 (four) hours as needed for shortness of breath. Reported on 12/30/2015 01/08/20  Yes Collene Gobble, MD  aspirin EC 81 MG tablet Take 81 mg by mouth daily.   Yes [provider]  Budeson-Glycopyrrol-Formoterol (BREZTRI AEROSPHERE) 160-9-4.8 MCG/ACT AERO Inhale 2 puffs into the lungs in the morning and at bedtime. 06/17/20  Yes Collene Gobble, MD  cholecalciferol (VITAMIN D) 1000 UNITS tablet Take 1,000 Units by mouth daily.   Yes [provider]  fluticasone (FLONASE) 50 MCG/ACT nasal spray Place 1 spray into the nose 2 (two) times daily.    Yes [provider]  folic acid (FOLVITE) 1 MG tablet Take 1 tablet (1 mg total) by mouth daily. 06/03/15   Yes [place], Holli Humbles, NP  hydroxyurea (HYDREA) 500 MG capsule Take 1 capsule (500 mg total) by mouth 3 (three) times daily before meals. 03/02/18  Yes En[place], Rudell Cobb, MD  Magnesium 200 MG TABS Take 1 tablet by mouth 2 (two) times daily.    Yes [provider]  montelukast (SINGULAIR) 10 MG tablet TAKE 1 TABLET BY MOUTH EVERYDAY AT BEDTIME Patient taking differently: Take 10 mg by mouth at bedtime.  02/26/20  Yes Collene Gobble, MD  omeprazole (PRILOSEC) 20 MG capsule TAKE 1 TABLET TWICE A DAY. 09/08/17  Yes Collene Gobble, MD  pravastatin (PRAVACHOL) 40 MG tablet Take 40 mg by mouth daily.   Yes [provider]  predniSONE (DELTASONE) 10 MG tablet Take 4 tablets (40 mg total)  by mouth daily with breakfast for 3 days, THEN 3 tablets (30 mg total) daily with breakfast for 3 days, THEN 2 tablets (20 mg total) daily with breakfast for 3 days, THEN 1 tablet (10 mg total) daily with breakfast for 3 days. 07/02/20 07/14/20 Yes [place], Rose Fillers, MD  benzonatate (TESSALON) 200 MG capsule Take 1 capsule (200 mg total) by mouth 3 (three) times daily as needed for cough. Patient not taking: Reported on 07/06/2020 12/10/19   Parrett, Fonnie Mu, NP  predniSONE (DELTASONE) 10 MG tablet Take 40mg  daily x's 5 days Patient not taking: Reported on 07/06/2020 06/16/20   Marshell Garfinkel, MD    Allergies    Penicillins  Review of Systems   Review of Systems  Respiratory: Positive for cough, chest tightness, shortness of breath and wheezing.   Cardiovascular: Positive for chest pain.  Neurological: Positive for dizziness.  All other systems reviewed and are negative.   Physical Exam Updated Vital Signs BP 123/76 (BP Location: Right Arm)   Pulse 68   Temp 97.6 F (36.4 C) (Oral)   Resp 15   Ht 5\' 2"  (1.575 m)   Wt 75.3 kg   SpO2 100%   BMI 30.36 kg/m   Physical Exam Vitals and nursing note reviewed.  Constitutional:      Appearance: She is well-developed.  HENT:     Head: Normocephalic  and atraumatic.     Right Ear: External ear normal.     Left Ear: External ear normal.     Nose: Nose normal.  Eyes:     General: No scleral icterus.    Conjunctiva/sclera: Conjunctivae normal.  Cardiovascular:     Rate and Rhythm: Normal rate and regular rhythm.     Heart sounds: Normal heart sounds.     Comments: No lower extremity edema.  No calf tenderness. Pulmonary:     Effort: Tachypnea and respiratory distress (Mild) present.     Breath sounds: Wheezing and rhonchi present.     Comments: Patient with audible wheezing/upper airway throat gurgling.  Speaking in full sentences however.  Forceful wet sounding cough during exam.  I attempted to ambulate patient but after approximately 30 seconds her heart rate increased to 123, she reported shortness of breath and sat down due to feeling lightheaded.  Heart rate quickly improved to low 100s.  Tachypneic on exertion.  Oxygen remained greater than 95%. Chest:     Chest wall: Tenderness present.     Comments: Parasternal tenderness Musculoskeletal:        General: No deformity. Normal range of motion.     Cervical back: Normal range of motion and neck supple.  Skin:    General: Skin is warm and dry.     Capillary Refill: Capillary refill takes less than 2 seconds.  Neurological:     Mental Status: She is alert and oriented to person, place, and time.  Psychiatric:        Behavior: Behavior normal.        Thought Content: Thought content normal.        Judgment: Judgment normal.     ED Results / Procedures / Treatments   Labs (all labs ordered are listed, but only abnormal results are displayed) Labs Reviewed  BASIC METABOLIC PANEL - Abnormal; Notable for the following components:      Result Value   Glucose, Bld 196 (*)    Calcium 8.4 (*)    All other components within normal limits  HEPATIC FUNCTION PANEL -  Abnormal; Notable for the following components:   Total Protein 5.8 (*)    Albumin 3.3 (*)    All other components  within normal limits  SARS CORONAVIRUS 2 BY RT PCR (HOSPITAL ORDER, [place] [facility])  BRAIN NATRIURETIC PEPTIDE  CBC WITH DIFFERENTIAL/PLATELET  TROPONIN I (HIGH SENSITIVITY)  TROPONIN I (HIGH SENSITIVITY)    EKG EKG Interpretation  Date/Time:  Monday July 06 2020 17:43:15 EDT Ventricular Rate:  95 PR Interval:    QRS Duration: 91 QT Interval:  356 QTC Calculation: 448 R Axis:   34 Text Interpretation: Sinus rhythm Low voltage, precordial leads Abnormal R-wave progression, early transition Confirmed by Lennice Sites [phone]) on 07/06/2020 5:44:58 PM   Radiology DG Chest Port 1 View  Result Date: 07/06/2020 CLINICAL DATA:  Rhonchi, shortness of breath EXAM: PORTABLE CHEST 1 VIEW COMPARISON:  CT 05/20/2020, radiograph 02/10/2020 FINDINGS: Chronically coarsened interstitial changes are similar to prior. Bilateral nipple shadows are seen. Accentuation of a confluence of vessels in the right mid lung superimposed upon the scapular tip and adjacent rib. Known left upper lobe pulmonary nodule is not well visualized on radiograph. No focal consolidative opacity is seen. No pneumothorax or effusion. Stable cardiomediastinal contours with a calcified aorta and small hiatal hernia. No acute osseous or soft tissue abnormality. Degenerative changes are present in the imaged spine and shoulders. IMPRESSION: 1. No acute cardiopulmonary abnormality. 2. Chronically coarsened interstitial changes compatible with features of interstitial lung disease seen on comparison cross-sectional imaging. 3. Hiatal hernia. 4.  Aortic Atherosclerosis (ICD10-I70.0). Electronically Signed   By: Lovena Le M.D.   On: 07/06/2020 17:50    Procedures Procedures (including critical care time)  Medications Ordered in ED Medications  albuterol (VENTOLIN HFA) 108 (90 Base) MCG/ACT inhaler 8 puff (8 puffs Inhalation Given 07/06/20 1732)  ipratropium (ATROVENT HFA) inhaler 2 puff (2 puffs Inhalation Given  07/06/20 1834)  magnesium sulfate IVPB 2 g 50 mL (0 g Intravenous Stopped 07/06/20 1908)  methylPREDNISolone sodium succinate (SOLU-MEDROL) 125 mg/2 mL injection 125 mg (125 mg Intravenous Given 07/06/20 1804)  albuterol (PROVENTIL,VENTOLIN) solution continuous neb (10 mg/hr Nebulization Given 07/06/20 2102)  ipratropium (ATROVENT) nebulizer solution 0.5 mg (0.5 mg Nebulization Given 07/06/20 2102)    ED Course  I have reviewed the triage vital signs and the nursing notes.  Pertinent labs & imaging results that were available during my care of the patient were reviewed by me and considered in my medical decision making (see chart for details).  Clinical Course as of Jul 06 2304  Mon Jul 06, 2020  1802 1. No acute cardiopulmonary abnormality. 2. Chronically coarsened interstitial changes compatible with features of interstitial lung disease seen on comparison cross-sectional imaging. 3. Hiatal hernia. 4. Aortic Atherosclerosis (ICD10-I70.0).   DG Chest Port 1 View [CG]  [phone] Sinus rhythm Low voltage, precordial leads Abnormal R-wave progression, early transition Confirmed by Lennice Sites [phone]) on 07/06/2020 5:44:58 PM  ED EKG [CG]  2053 Re-evaluated patient.  No respiratory distress. Speaking in full sentences.  Continued but improved wheezing/rhonchi.    [CG]    Clinical Course User Index [CG] Kinnie Feil, PA-C   MDM Rules/Calculators/A&P                          I obtained additional history from triage, nursing notes and review of medical chart.  Previous medical records available, nursing notes reviewed to obtain more history and assist with MDM.  Patient has appointment with pulmonology 8/4.  Started prednisone taper last week by pulmonology. Compliant with breathing treatments at home. COVID + 6 months ago, now fully vaccinated.   Chief complain involves an extensive number of treatment options and is a complaint that carries with it a high risk of complications and  morbidity and mortality.    Highest on ddx is persistent RAD/ILD.  No history of CHF, peripheral edema but HF exacerbation also possible.  Given chronicity of symptoms, lung exam PE less likely.  Doubt ACS in this clinical setting.    Discussed with EDP [place], ordered labs and meds. Laboratory studies and imaging ordered in triage by triage RN.  I have personally visualized and interpreted these.    CXR without acute CP abnormalities, edema, opacities.   COVID negative.  Normal WBC. Normal BNP. Trop undetectable.   2055: Patient now on continuous nebulizer treatment, wheezing improved. Patient re-evaluated several times.  Persistent but improved wheezing, overall clinical improvement. VS normal now normal. She feels better.    Patient handed off to oncoming EDPA. Will give continuous neb treatment, ambulate.  Discussed with patient option to admit for observation given persistent wheezing and subjective wheezing vs discharge with close pulmonology follow up.  She is agreeable to discharge.  This is reasonable given benign ER work up, clinical improvement. She ambulated without hypoxia.  Has scheduled pulm appointment in 2 days.  Shared with EDP  Final Clinical Impression(s) / ED Diagnoses Final diagnoses:  SOB (shortness of breath)    Rx / DC Orders ED Discharge Orders    None       Kinnie Feil, PA-C 07/06/20 2305    Lennice Sites, DO 07/07/20 1735

## 2020-07-07 LAB — T4: T4, Total: 4.3 ug/dL — ABNORMAL LOW (ref 4.5–12.0)

## 2020-07-07 NOTE — ED Notes (Signed)
Ambulated pt with pulse ox from room 21 around charge nurse station and back to pt room. SpO2 remained at 100% on room air.

## 2020-07-07 NOTE — Discharge Instructions (Addendum)
Please continue taking your steroids that were prescribed by your pulmonologist.  You may also continue to follow-up with pulmonology at your schedule appointment in the next 2 days.

## 2020-07-08 ENCOUNTER — Other Ambulatory Visit: Payer: Self-pay

## 2020-07-08 ENCOUNTER — Encounter: Payer: Self-pay | Admitting: Internal Medicine

## 2020-07-08 ENCOUNTER — Ambulatory Visit: Payer: Medicare Other | Admitting: Internal Medicine

## 2020-07-08 DIAGNOSIS — J4489 Other specified chronic obstructive pulmonary disease: Secondary | ICD-10-CM

## 2020-07-08 DIAGNOSIS — J449 Chronic obstructive pulmonary disease, unspecified: Secondary | ICD-10-CM | POA: Diagnosis not present

## 2020-07-08 DIAGNOSIS — J45909 Unspecified asthma, uncomplicated: Secondary | ICD-10-CM | POA: Insufficient documentation

## 2020-07-08 MED ORDER — PROMETHAZINE-DM 6.25-15 MG/5ML PO SYRP
5.0000 mL | ORAL_SOLUTION | Freq: Four times a day (QID) | ORAL | 0 refills | Status: DC | PRN
Start: 2020-07-08 — End: 2021-09-04

## 2020-07-08 MED ORDER — PANTOPRAZOLE SODIUM 40 MG PO TBEC: 40.0000 mg | DELAYED_RELEASE_TABLET | Freq: Every day | ORAL | 2 refills | Status: DC

## 2020-07-08 NOTE — Progress Notes (Signed)
Subjective:    Patient ID: [name], female    DOB: 26-Mar-1946   MRN: [id number]    Brief patient profile:   61 yobf  Quit smoking 1996   with a history of asthma and vocal cord dysfunction that did not improve p quit smoking and both of which have been significantly impacted by severe esophageal reflux. Has a hx esophageal stricture dilations by Dr Henrene Pastor.    07/08/2020  f/u ov/Lucielle Vokes re: vcd/gerd/ asthma /RA on prednisone  Since 06/16/20 not much better  Chief Complaint  Patient presents with  . Follow-up    reports increased shortness of breath and productive cough x2 weeks. States seen 3 days ago for same in ER "they told me it was my asthma".  As maint prilosec p bfast / before supper worse and no need for saba before got sick  55months prior to OV  (not two weeks as above) and never btter since despite addition of breztri  Dyspnea:  Room to room  Cough: more than usual, mostly mucid  Sleeping: bothered by nightly cough/sob // bed is flat 3 pillows under heda  SABA use: last 3 h prior to OV   02: none Using lots of mints / cough drops   No obvious day to day or daytime variability or assoc   purulent sputum or mucus plugs or hemoptysis or cp or chest tightness, subjective wheeze or overt sinus or hb symptoms.   Also denies any obvious fluctuation of symptoms with weather or environmental changes or other aggravating or alleviating factors except as outlined above   No unusual exposure hx or h/o childhood pna/ asthma or knowledge of premature birth.  Current Allergies, Complete Past Medical History, Past Surgical History, Family History, and Social History were reviewed in [employer] record.  ROS  The following are not active complaints unless bolded Hoarseness, sore throat, dysphagia, dental problems, itching, sneezing,  nasal congestion or discharge of excess mucus or purulent secretions, ear ache,   fever, chills, sweats, unintended wt loss or wt gain,  classically pleuritic or exertional cp,  orthopnea pnd or arm/hand swelling  or leg swelling, presyncope, palpitations, abdominal pain, anorexia, nausea, vomiting, diarrhea  or change in bowel habits or change in bladder habits, change in stools or change in urine, dysuria, hematuria,  rash, arthralgias, visual complaints, headache, numbness, weakness or ataxia or problems with walking or coordination,  change in mood or  memory.        Current Meds  Medication Sig  . albuterol (PROVENTIL HFA) 108 (90 Base) MCG/ACT inhaler Inhale 2 puffs into the lungs every 4 (four) hours as needed for wheezing or shortness of breath. Reported on 12/30/2015  . albuterol (PROVENTIL) (2.5 MG/3ML) 0.083% nebulizer solution Take 3 mLs (2.5 mg total) by nebulization every 4 (four) hours as needed for shortness of breath. Reported on 12/30/2015  . aspirin EC 81 MG tablet Take 81 mg by mouth daily.  . benzonatate (TESSALON) 200 MG capsule Take 1 capsule (200 mg total) by mouth 3 (three) times daily as needed for cough.  . Budeson-Glycopyrrol-Formoterol (BREZTRI AEROSPHERE) 160-9-4.8 MCG/ACT AERO Inhale 2 puffs into the lungs in the morning and at bedtime.  . cholecalciferol (VITAMIN D) 1000 UNITS tablet Take 1,000 Units by mouth daily.  . fluticasone (FLONASE) 50 MCG/ACT nasal spray Place 1 spray into the nose 2 (two) times daily.   . folic acid (FOLVITE) 1 MG tablet Take 1 tablet (1 mg total) by mouth daily.  Marland Kitchen  hydroxyurea (HYDREA) 500 MG capsule Take 1 capsule (500 mg total) by mouth 3 (three) times daily before meals.  . Magnesium 200 MG TABS Take 1 tablet by mouth 2 (two) times daily.   . montelukast (SINGULAIR) 10 MG tablet TAKE 1 TABLET BY MOUTH EVERYDAY AT BEDTIME (Patient taking differently: Take 10 mg by mouth at bedtime. )  . omeprazole (PRILOSEC) 20 MG capsule TAKE 1 TABLET TWICE A DAY.  . pravastatin (PRAVACHOL) 40 MG tablet Take 40 mg by mouth daily.  . predniSONE (DELTASONE) 10 MG tablet Take 40mg  daily x's 5  days  . predniSONE (DELTASONE) 10 MG tablet Take 4 tablets (40 mg total) by mouth daily with breakfast for 3 days, THEN 3 tablets (30 mg total) daily with breakfast for 3 days, THEN 2 tablets (20 mg total) daily with breakfast for 3 days, THEN 1 tablet (10 mg total) daily with breakfast for 3 days.   Current Facility-Administered Medications for the 07/08/20 encounter (Office Visit) with Tanda Rockers, MD  Medication  . 0.9 %  sodium chloride infusion                     Objective:   Physical Exam   Wt Readings from Last 3 Encounters:  07/08/20 168 lb (76.2 kg)  07/06/20 166 lb (75.3 kg)  07/06/20 168 lb 12.8 oz (76.6 kg)   12/10/2012 196    amb pleasant bf audible wheeze across the room   Vital signs reviewed - Note on arrival 07/08/2020  02 sats  99% on RA     HEENT : pt wearing mask not removed for exam due to covid -19 concerns.    NECK :  without JVD/Nodes/TM/ nl carotid upstrokes bilaterally   LUNGS: no acc muscle use,  Mod barrel  contour chest wall with bilateral  Distant bs s audible wheeze (mostly transmitted noise from upper airway)  and  without cough on insp or exp maneuvers and mod  Hyperresonant  to  percussion bilaterally     CV:  RRR  no s3 or murmur or increase in P2, and no edema   ABD:  soft and nontender with pos mid insp Hoover's  in the supine position. No bruits or organomegaly appreciated, bowel sounds nl  MS:     ext warm without deformities, calf tenderness, cyanosis or clubbing No obvious joint restrictions   SKIN: warm and dry without lesions    NEURO:  alert, approp, nl sensorium with  no motor or cerebellar deficits apparent.           Assessment & Plan:

## 2020-07-08 NOTE — Patient Instructions (Addendum)
Finish the prednisone   Stop the omeprazole  For cough phenergan dm up to a tsp or two every 4 hours as needed   Protonix 40 mg (= 2 omeprazole) Take 30- 60 min before your first and last meals of the day   GERD (REFLUX)  is an extremely common cause of respiratory symptoms just like yours , many times with no obvious heartburn at all.    It can be treated with medication, but also with lifestyle changes including elevation of the head of your bed (ideally with 6 -8inch blocks under the headboard of your bed),  Smoking cessation, avoidance of late meals, excessive alcohol, and avoid fatty foods, chocolate, peppermint, colas, red wine, and acidic juices such as orange juice.  NO MINT OR MENTHOL PRODUCTS SO NO COUGH DROPS  USE SUGARLESS CANDY INSTEAD (Jolley ranchers or Stover's or Life Savers) or even ice chips will also do - the key is to swallow to prevent all throat clearing. NO OIL BASED VITAMINS - use powdered substitutes.  Avoid fish oil when coughing.  Work on inhaler technique:  relax and gently blow all the way out then take a nice smooth deep breath back in, triggering the inhaler at same time you start breathing in.  Hold for up to 5 seconds if you can. Blow out thru nose. Rinse and gargle with water when done  Keep your previous appts and call sooner if needed

## 2020-07-08 NOTE — Assessment & Plan Note (Addendum)
Onset 1996  While smoking that never improved per pt with RA as well -  PFT's  04/24/20   FEV1 0.94 (57 % ) ratio 0.50  p 0 % improvement from saba p 0 prior to study with DLCO  15.84 (83%) corrects to 5.76 (139%)  for alv volume and FV curve classic concave exp curve s plateau on insp  - 07/08/2020  After extensive coaching inhaler device,  effectiveness =    Nearly 0 (triggers at end insp)   - 07/08/2020 rec max gerd rx/ cough suppression/ continue breztri 2bid    Group D in terms of symptom/risk and laba/lama/ICS  therefore appropriate rx at this point >>>  breztri appropriate but needs to work harder on correct hfa technique with or without spacer/ advised  Of the three most common causes of  Sub-acute / recurrent or chronic cough, only one (GERD)  can actually contribute to/ trigger  the other two (asthma and post nasal drip syndrome)  and perpetuate the cylce of cough.  While not intuitively obvious, many patients with chronic low grade reflux do not cough until there is a primary insult that disturbs the protective epithelial barrier and exposes sensitive nerve endings.   This is typically viral but can due to PNDS and  either may apply here.    >>>>  The point is that once this occurs, it is difficult to eliminate the cycle  using anything but a maximally effective acid suppression regimen at least in the short run, accompanied by an appropriate diet to address non acid GERD and control / eliminate the cough itself to the extent possible with phenergan dm 1 -2 tsp q 4 h and use neb prn rather than more hfa which may aggravate the problem.  Also of concern is cricoaretenoiditis from RA but I would have thought we would have seen a plateau on insp portion of f/v loop and this was not the case.          Each maintenance medication was reviewed in detail including emphasizing most importantly the difference between maintenance and prns and under what circumstances the prns are to be triggered using  an action plan format where appropriate.  Total time for H and P, chart review, counseling, teaching device and generating customized AVS unique to this acute  office visit with complex pt new to me / charting = 41 min

## 2020-07-09 ENCOUNTER — Telehealth: Payer: Self-pay | Admitting: Emergency Medicine

## 2020-07-09 MED ORDER — PANTOPRAZOLE SODIUM 40 MG PO TBEC: 40.0000 mg | DELAYED_RELEASE_TABLET | Freq: Two times a day (BID) | ORAL | 5 refills | Status: DC

## 2020-07-09 NOTE — Telephone Encounter (Signed)
Patient and pharmacy notified that prescription was resent with corrected sig based on Dr. Gustavus Bryant office visit notes.

## 2020-07-27 ENCOUNTER — Ambulatory Visit: Payer: Medicare Other | Admitting: Emergency Medicine

## 2020-07-27 ENCOUNTER — Other Ambulatory Visit: Payer: Self-pay

## 2020-07-27 ENCOUNTER — Encounter: Payer: Self-pay | Admitting: Emergency Medicine

## 2020-07-27 DIAGNOSIS — J383 Other diseases of vocal cords: Secondary | ICD-10-CM

## 2020-07-27 DIAGNOSIS — J309 Allergic rhinitis, unspecified: Secondary | ICD-10-CM | POA: Diagnosis not present

## 2020-07-27 DIAGNOSIS — K219 Gastro-esophageal reflux disease without esophagitis: Secondary | ICD-10-CM

## 2020-07-27 DIAGNOSIS — J449 Chronic obstructive pulmonary disease, unspecified: Secondary | ICD-10-CM | POA: Diagnosis not present

## 2020-07-27 DIAGNOSIS — J849 Interstitial pulmonary disease, unspecified: Secondary | ICD-10-CM

## 2020-07-27 NOTE — Patient Instructions (Addendum)
Please continue Breztri 2 puffs twice a day as you have been taking it.  Rinse and gargle after using. Keep albuterol nebulizer available to use 1 nebulizer treatment up to every 4 hours if needed for shortness of breath, chest tightness, wheezing. COVID-19 vaccine is up-to-date.  Please continue to follow for recommendations about the timing of future booster shots for patients with chronic lung disease.  The timing has not yet been determined. Continue Protonix as you have been taking it. Continue Flonase as you have been taking it Try stopping Pravachol (cholesterol medicine) for about 2 weeks to see if this helps your muscle cramping.  Call and report this information to Dr. Ashby Dawes to discuss whether you should stay on the Pravachol long-term. Follow with Dr Lamonte Sakai in 3 months

## 2020-07-27 NOTE — Assessment & Plan Note (Signed)
Continue nasal steroid

## 2020-07-27 NOTE — Progress Notes (Signed)
  Subjective:    Patient ID: [name], female    DOB: 04/17/1946   MRN: [id number] HPI  ROV 07/27/20 --Ms. Popson is 43 with a history of RA formerly on methotrexate, moderate persistent asthma and chronic cough and upper airway irritation syndrome/VCD.  We did not find ILD on high-resolution CT scan of the chest from May 2021.  Her pulmonary function testing shows severe obstruction, possible associated restriction.  We have been managing her on Breztri.  Her cough and stridor have been labile, has been requiring prednisone recently, short course given 7/13 with Dr. Vaughan Browner and then I extended the course 07/02/2020.  Her pred finished last week. Her omeprazole was changed to protonix. She feels breztri is helping her. She is coughing less, voice is stronger. Still coughs daily. Has dyspnea and cough w exertion. She is using albuterol nebs 1-2x a day.  COVID vaccine up to date.   Alpha 1 antitrypsin 7/13: MM, CBC 07/06/2020 without eosinophils ANA positive cytoplasmic pattern, 1: 40. IgE 15 SCL 70, HSP panel (fungal), ANCA screen, SSA/SSB, all other antibody serologies all negative.   Objective:   Physical Exam Vitals:   07/27/20 0958  BP: 110/66  Pulse: 91  Temp: 98.1 F (36.7 C)  TempSrc: Temporal  SpO2: 99%  Weight: 164 lb 6.4 oz (74.6 kg)  Height: 5\' 2"  (1.575 m)   Gen: Pleasant, overwt, in no distress,  normal affect  ENT: No lesions,  mouth clear,  oropharynx clear, no postnasal drip, strong voice  Neck: supple, loud coarse UA noise  Lungs : distant, loud stridor referred with coexisting exp wheeze  Cardiovascular: RRR, heart sounds normal, no murmur or gallops, no peripheral edema  Musculoskeletal: No deformities, no cyanosis or clubbing  Neuro: alert, non focal  Skin: Warm, no rash    Assessment & Plan:   ILD (interstitial lung disease) (HCC) Serologies negative with the exception of a mildly positive cytoplasmic ANA.  She does have a history of RA.  For now no  targeted ILD therapeutics been recommended.  Appreciate Dr. Matilde Bash help.  I will continue to follow her with serial imaging  Asthma-COPD overlap syndrome (HCC) Severe obstruction but many of her day-to-day symptoms are related upper airway irritation, cough, stridor.  She is benefiting from [place] and we will continue.  She uses albuterol 1-2 times a day.  COVID-19 vaccine is up-to-date  Allergic rhinitis Continue nasal steroid  GERD (gastroesophageal reflux disease) She does not have any overt GERD symptoms but her cough is better on the pantoprazole so I think we should continue it  Vocal cord dysfunction With cough.  GERD and rhinitis contributing.  Now off steroids.  She is tolerating the breztri plan to continue current regimen  Baltazar Apo, MD, PhD 07/27/2020, 10:32 AM [facility] [phone] or if no answer [phone]

## 2020-07-27 NOTE — Assessment & Plan Note (Signed)
Serologies negative with the exception of a mildly positive cytoplasmic ANA.  She does have a history of RA.  For now no targeted ILD therapeutics been recommended.  Appreciate Dr. Matilde Bash help.  I will continue to follow her with serial imaging

## 2020-07-27 NOTE — Assessment & Plan Note (Signed)
With cough.  GERD and rhinitis contributing.  Now off steroids.  She is tolerating the breztri plan to continue current regimen

## 2020-07-27 NOTE — Assessment & Plan Note (Signed)
Severe obstruction but many of her day-to-day symptoms are related upper airway irritation, cough, stridor.  She is benefiting from [place] and we will continue.  She uses albuterol 1-2 times a day.  COVID-19 vaccine is up-to-date

## 2020-07-27 NOTE — Assessment & Plan Note (Signed)
She does not have any overt GERD symptoms but her cough is better on the pantoprazole so I think we should continue it

## 2020-07-29 ENCOUNTER — Other Ambulatory Visit: Payer: Self-pay | Admitting: *Deleted

## 2020-07-29 NOTE — Patient Outreach (Signed)
[facility]) Care Management  [place]  07/29/2020   [name] [birth year] [id number]  Subjective: Successful telephone outreach call to patient. HIPAA identifiers obtained. Patient explained that she had a rough time with her breathing for over a month. She kept in close contact with pulmonology and had several clinical visits but unfortunately she eventually presented to the ED on 07/06/20. Patient had a visit with Dr. Melvyn Novas on 07/08/20, stayed in close contact with her pulmonologist Dr. Lamonte Sakai , and had a visit with Dr. Lamonte Sakai on 07/27/20. Currently, patient states she is feeling much better. She explained that she does feel that the [facility] inhaler is working for her and she is using her nebulizer less usually 1-2 times daily; occasionally 3 times if needed. Patient reports that she feels that her COPD is back to baseline and is controlled at this time. Patient states that she cannot afford her prescribed albuterol inhaler which is why she uses the nebulizer more frequently. She also shares that she is concerned about the [place] medication assistance running out in October; adding that she will not be able to afford this out of pocket. Nurse discussed with patient that she will place a pharmacy referral for medication assistance. Patient did report that she has a decreased appetite but denies that SOB interferes with her eating. Per patient she has gradually lost about 30 pounds. Her BMI on 07/27/20 was 30.7 and patient reports that she does not feel she has lost this weight too rapidly. Nurse discussed supplementing with Ensure and suggested eating small frequent meals to ensure she is getting adequate nutrition. Patient denies having any recent falls and she feels fully supported by her family.    Encounter Medications:  Outpatient Encounter Medications as of 07/29/2020  Medication Sig  . albuterol (PROVENTIL HFA) 108 (90 Base) MCG/ACT inhaler Inhale 2 puffs into the lungs  every 4 (four) hours as needed for wheezing or shortness of breath. Reported on 12/30/2015  . albuterol (PROVENTIL) (2.5 MG/3ML) 0.083% nebulizer solution Take 3 mLs (2.5 mg total) by nebulization every 4 (four) hours as needed for shortness of breath. Reported on 12/30/2015  . aspirin EC 81 MG tablet Take 81 mg by mouth daily.  . Budeson-Glycopyrrol-Formoterol (BREZTRI AEROSPHERE) 160-9-4.8 MCG/ACT AERO Inhale 2 puffs into the lungs in the morning and at bedtime.  . cholecalciferol (VITAMIN D) 1000 UNITS tablet Take 1,000 Units by mouth daily.  . fluticasone (FLONASE) 50 MCG/ACT nasal spray Place 1 spray into the nose 2 (two) times daily.   . folic acid (FOLVITE) 1 MG tablet Take 1 tablet (1 mg total) by mouth daily.  . hydroxyurea (HYDREA) 500 MG capsule Take 1 capsule (500 mg total) by mouth 3 (three) times daily before meals.  . Magnesium 200 MG TABS Take 1 tablet by mouth 2 (two) times daily.   . montelukast (SINGULAIR) 10 MG tablet TAKE 1 TABLET BY MOUTH EVERYDAY AT BEDTIME (Patient taking differently: Take 10 mg by mouth at bedtime. )  . pantoprazole (PROTONIX) 40 MG tablet Take 1 tablet (40 mg total) by mouth 2 (two) times daily. Take 30- 60 min before your first and last meals of the day  . pravastatin (PRAVACHOL) 40 MG tablet Take 40 mg by mouth daily.  . promethazine-dextromethorphan (PROMETHAZINE-DM) 6.25-15 MG/5ML syrup Take 5 mLs by mouth 4 (four) times daily as needed for cough.   Facility-Administered Encounter Medications as of 07/29/2020  Medication  . 0.9 %  sodium chloride infusion  Functional Status:  In your present state of health, do you have any difficulty performing the following activities: 05/05/2020  Hearing? N  Vision? N  Difficulty concentrating or making decisions? N  Walking or climbing stairs? N  Dressing or bathing? N  Doing errands, shopping? N  Preparing Food and eating ? N  Using the Toilet? N  In the past six months, have you accidently leaked urine? N   Do you have problems with loss of bowel control? N  Managing your Medications? N  Managing your Finances? N  Housekeeping or managing your Housekeeping? N  Some recent data might be hidden    Fall/Depression Screening: Fall Risk  07/29/2020 05/05/2020 06/11/2019  Falls in the past year? 0 0 0  Number falls in past yr: 0 0 -  Injury with Fall? 0 0 -  Comment - - -  Risk Factor Category  - - -  Risk for fall due to : - - -  Follow up Falls prevention discussed;Education provided;Falls evaluation completed Falls prevention discussed;Education provided;Falls evaluation completed -   PHQ 2/9 Scores 07/29/2020 05/05/2020 06/11/2019 06/10/2019 06/10/2019 03/29/2018 01/31/2014  PHQ - 2 Score 0 0 1 0 0 0 0   Goals Addressed            This Visit's Progress   . Patient will not go to the ED or will not be hospitalized for COPD exacerbation within the next 90 days       CARE PLAN ENTRY (see longtitudinal plan of care for additional care plan information)  Current Barriers:  Marland Kitchen Knowledge deficits related to basic COPD self care/management . Cannot afford prescribed medications   Case Manager Clinical Goal(s):  Over the next 90 days patient will report using inhalers as prescribed including rinsing mouth after use  Over the next 90 days patient will report utilizing pursed lip breathing for shortness of breath  Over the next 90 days, patient will be able to verbalize understanding of COPD action plan and when to seek appropriate levels of medical care  Over the next 90 days, patient will engage in lite exercise as tolerated to build/regain stamina and strength and reduce shortness of breath through activity tolerance  Over the next 90 days, patient will verbalize basic understanding of COPD disease process and self care activities  Over the next 90 days, patient will not be hospitalized for COPD exacerbation   Interventions:   Provided patient with basic written and verbal COPD education on  self care/management/and exacerbation prevention   Provided patient with COPD action plan and reinforced importance of daily self assessment  Provided written and verbal instructions on pursed lip breathing and utilized returned demonstration as teach back  Advised patient to self assesses COPD action plan zone and make appointment with provider if in the yellow zone for 48 hours without improvement.  Provided patient with education about the role of exercise in the management of COPD  Nurse encouraged patient to continue taking respiratory medications as prescribed and as needed, discussed increasing physical activity as tolerated to help lungs function properly and to maintain strength, discusses supplementing with Ensure due to patient's decreased appetite (she denies having difficulty eating due to SOB), reviewed COPD action plan she and Dr. Lamonte Sakai have in place and encouraged doctor contact as needed.   Patient Self Care Activities:  . Takes medications as prescribed including inhalers . Self assesses COPD action plan zone and makes appointment with provider if in the yellow zone for 48  hours without improvement. . Patient states she keeps moving during the day doing needed task around the house. She does  sit to rest as needed and will get back up when she is no longer SOB with a goal of not being sedentary.  Patient is unable to afford prescribed albuterol inhaler and is concerned that her current assistance for Judithann Sauger will stop in October. Nurse placed pharmacy referral for assistance.  . No self care deficits at this time  Initial goal documentation          Plan: [place] will send PCP today's assessment note, will send patient COPD education and Ensure coupons, will call patient within the month of October, and patient agrees to future outreach calls.   Emelia Loron RN, BSN [place] [phone] [email or web address]

## 2020-07-29 NOTE — Patient Outreach (Signed)
Referral from Jill Wine, RN to [facility] for Medication Assistance. 

## 2020-08-03 ENCOUNTER — Other Ambulatory Visit: Payer: Self-pay | Admitting: Pharmacy Technician

## 2020-08-03 NOTE — Patient Outreach (Signed)
[facility]) Care Management  08/03/2020  [name] [birth year] [id number]   Erroneous encounter.   Soham Hollett P. Renate Danh, [place]  [phone]

## 2020-08-27 DIAGNOSIS — M25512 Pain in left shoulder: Secondary | ICD-10-CM | POA: Diagnosis not present

## 2020-08-27 DIAGNOSIS — Z23 Encounter for immunization: Secondary | ICD-10-CM | POA: Diagnosis not present

## 2020-08-27 DIAGNOSIS — R5383 Other fatigue: Secondary | ICD-10-CM | POA: Diagnosis not present

## 2020-08-27 DIAGNOSIS — G4762 Sleep related leg cramps: Secondary | ICD-10-CM | POA: Diagnosis not present

## 2020-08-27 DIAGNOSIS — E782 Mixed hyperlipidemia: Secondary | ICD-10-CM | POA: Diagnosis not present

## 2020-08-27 DIAGNOSIS — R252 Cramp and spasm: Secondary | ICD-10-CM | POA: Diagnosis not present

## 2020-09-07 ENCOUNTER — Inpatient Hospital Stay: Payer: Medicare Other | Attending: Hematology & Oncology | Admitting: Hematology & Oncology

## 2020-09-07 ENCOUNTER — Telehealth: Payer: Self-pay | Admitting: Hematology & Oncology

## 2020-09-07 ENCOUNTER — Other Ambulatory Visit: Payer: Self-pay

## 2020-09-07 ENCOUNTER — Inpatient Hospital Stay: Payer: Medicare Other

## 2020-09-07 ENCOUNTER — Encounter: Payer: Self-pay | Admitting: Hematology & Oncology

## 2020-09-07 VITALS — BP 109/50 | HR 66 | Temp 98.0°F | Resp 20 | Wt 163.0 lb

## 2020-09-07 DIAGNOSIS — D573 Sickle-cell trait: Secondary | ICD-10-CM | POA: Insufficient documentation

## 2020-09-07 DIAGNOSIS — D473 Essential (hemorrhagic) thrombocythemia: Secondary | ICD-10-CM | POA: Insufficient documentation

## 2020-09-07 DIAGNOSIS — Z79899 Other long term (current) drug therapy: Secondary | ICD-10-CM | POA: Diagnosis not present

## 2020-09-07 DIAGNOSIS — Z7982 Long term (current) use of aspirin: Secondary | ICD-10-CM | POA: Diagnosis not present

## 2020-09-07 LAB — CBC WITH DIFFERENTIAL (CANCER CENTER ONLY)
Abs Immature Granulocytes: 0.07 10*3/uL (ref 0.00–0.07)
Basophils Absolute: 0 10*3/uL (ref 0.0–0.1)
Basophils Relative: 1 %
Eosinophils Absolute: 0.1 10*3/uL (ref 0.0–0.5)
Eosinophils Relative: 3 %
HCT: 35.9 % — ABNORMAL LOW (ref 36.0–46.0)
Hemoglobin: 11.3 g/dL — ABNORMAL LOW (ref 12.0–15.0)
Immature Granulocytes: 1 %
Lymphocytes Relative: 34 %
Lymphs Abs: 1.9 10*3/uL (ref 0.7–4.0)
MCH: 27.5 pg (ref 26.0–34.0)
MCHC: 31.5 g/dL (ref 30.0–36.0)
MCV: 87.3 fL (ref 80.0–100.0)
Monocytes Absolute: 0.4 10*3/uL (ref 0.1–1.0)
Monocytes Relative: 7 %
Neutro Abs: 3.1 10*3/uL (ref 1.7–7.7)
Neutrophils Relative %: 54 %
Platelet Count: 540 10*3/uL — ABNORMAL HIGH (ref 150–400)
RBC: 4.11 MIL/uL (ref 3.87–5.11)
RDW: 18.9 % — ABNORMAL HIGH (ref 11.5–15.5)
WBC Count: 5.7 10*3/uL (ref 4.0–10.5)
nRBC: 0 % (ref 0.0–0.2)

## 2020-09-07 LAB — CMP (CANCER CENTER ONLY)
ALT: 10 U/L (ref 0–44)
AST: 14 U/L — ABNORMAL LOW (ref 15–41)
Albumin: 3.7 g/dL (ref 3.5–5.0)
Alkaline Phosphatase: 73 U/L (ref 38–126)
Anion gap: 5 (ref 5–15)
BUN: 12 mg/dL (ref 8–23)
CO2: 29 mmol/L (ref 22–32)
Calcium: 9.4 mg/dL (ref 8.9–10.3)
Chloride: 105 mmol/L (ref 98–111)
Creatinine: 0.99 mg/dL (ref 0.44–1.00)
GFR, Est AFR Am: >60 mL/min (ref >60)
GFR, Estimated: 56 mL/min — ABNORMAL LOW (ref >60)
Glucose, Bld: 87 mg/dL (ref 70–99)
Potassium: 3.9 mmol/L (ref 3.5–5.1)
Sodium: 139 mmol/L (ref 135–145)
Total Bilirubin: 0.4 mg/dL (ref 0.3–1.2)
Total Protein: 5.9 g/dL — ABNORMAL LOW (ref 6.5–8.1)

## 2020-09-07 LAB — SAVE SMEAR(SSMR), FOR PROVIDER SLIDE REVIEW

## 2020-09-07 LAB — LACTATE DEHYDROGENASE: LDH: 250 U/L — ABNORMAL HIGH (ref 98–192)

## 2020-09-07 NOTE — Telephone Encounter (Signed)
Appointments scheduled calendar printed per 10/4 los 

## 2020-09-07 NOTE — Progress Notes (Signed)
Hematology and Oncology Follow Up Visit  [name] [id number] 1946/05/10 74 y.o. 09/07/2020   Principle Diagnosis:  Essential thrombocythemia - JAK2 POSITIVE Sickle cell trait  Current Therapy:   Hydrea 500 mg PO TID - dose changed on 76/19/5093 Folic acid 1 mg by mouth daily Aspirin 81 mg daily    Interim History:  [name] is here today for follow-up.  She is doing better.  Her breathing seems to be doing much better.  She is is little bit of wheezing.  She has had no issues with fever.  She has had a coronavirus vaccines.  Pressures had no fever.  She has had no nausea or vomiting.  She has had no change in bowel or bladder habits.  She is still on the Hydrea.  She is not changed the dosage of this.  She is taking this daily.  Her appetite is doing quite well.  There is no bleeding.  She has had no bruising.  She has had no leg swelling.  Overall, I would say her performance status is ECOG 1.    Medications:  Allergies as of 09/07/2020      Reactions   Penicillins Itching, Rash   Did it involve swelling of the face/tongue/throat, SOB, or low BP? N Did it involve sudden or severe rash/hives, skin peeling, or any reaction on the inside of your mouth or nose? Y Did you need to seek medical attention at a hospital or doctor's office? N When did it last happen?Decades Ago If all above answers are "NO", may proceed with cephalosporin use.      Medication List       Accurate as of September 07, 2020  9:59 AM. If you have any questions, ask your nurse or doctor.        albuterol (2.5 MG/3ML) 0.083% nebulizer solution Commonly known as: PROVENTIL Take 3 mLs (2.5 mg total) by nebulization every 4 (four) hours as needed for shortness of breath. Reported on 12/30/2015   albuterol 108 (90 Base) MCG/ACT inhaler Commonly known as: Proventil HFA Inhale 2 puffs into the lungs every 4 (four) hours as needed for wheezing or shortness of breath. Reported on 12/30/2015     aspirin EC 81 MG tablet Take 81 mg by mouth daily.   Breztri Aerosphere 160-9-4.8 MCG/ACT Aero Generic drug: Budeson-Glycopyrrol-Formoterol Inhale 2 puffs into the lungs in the morning and at bedtime.   cholecalciferol 1000 units tablet Commonly known as: VITAMIN D Take 1,000 Units by mouth daily.   fluticasone 50 MCG/ACT nasal spray Commonly known as: FLONASE Place 1 spray into the nose 2 (two) times daily.   folic acid 1 MG tablet Commonly known as: FOLVITE Take 1 tablet (1 mg total) by mouth daily.   gabapentin 100 MG capsule Commonly known as: NEURONTIN SMARTSIG:2 Capsule(s) By Mouth Every Evening   hydroxyurea 500 MG capsule Commonly known as: HYDREA Take 1 capsule (500 mg total) by mouth 3 (three) times daily before meals.   Magnesium 200 MG Tabs Take 1 tablet by mouth 2 (two) times daily.   montelukast 10 MG tablet Commonly known as: SINGULAIR TAKE 1 TABLET BY MOUTH EVERYDAY AT BEDTIME What changed: See the new instructions.   pantoprazole 40 MG tablet Commonly known as: Protonix Take 1 tablet (40 mg total) by mouth 2 (two) times daily. Take 30- 60 min before your first and last meals of the day   pravastatin 40 MG tablet Commonly known as: PRAVACHOL Take 40 mg by mouth  daily.   promethazine-dextromethorphan 6.25-15 MG/5ML syrup Commonly known as: PROMETHAZINE-DM Take 5 mLs by mouth 4 (four) times daily as needed for cough.       Allergies:  Allergies  Allergen Reactions  . Penicillins Itching and Rash    Did it involve swelling of the face/tongue/throat, SOB, or low BP? N Did it involve sudden or severe rash/hives, skin peeling, or any reaction on the inside of your mouth or nose? Y Did you need to seek medical attention at a hospital or doctor's office? N When did it last happen?Decades Ago If all above answers are "NO", may proceed with cephalosporin use.      Past Medical History, Surgical history, Social history, and Family History  were reviewed and updated.  Review of Systems:   Review of Systems  Constitutional: Negative.   HENT: Negative.   Eyes: Negative.   Respiratory: Negative.   Cardiovascular: Negative.   Gastrointestinal: Negative.   Genitourinary: Negative.   Musculoskeletal: Negative.   Skin: Negative.   Neurological: Negative.   Endo/Heme/Allergies: Negative.   Psychiatric/Behavioral: Negative.      Physical Exam:  weight is 163 lb (73.9 kg). Her oral temperature is 98 F (36.7 C). Her blood pressure is 109/50 (abnormal) and her pulse is 66. Her respiration is 20 and oxygen saturation is 100%.   Wt Readings from Last 3 Encounters:  09/07/20 163 lb (73.9 kg)  07/27/20 164 lb 6.4 oz (74.6 kg)  07/08/20 168 lb (76.2 kg)     Physical Activity:   . Days of Exercise per Week: Not on file  . Minutes of Exercise per Session: Not on file     Physical Exam Vitals reviewed.  HENT:     Head: Normocephalic and atraumatic.  Eyes:     Pupils: Pupils are equal, round, and reactive to light.  Cardiovascular:     Rate and Rhythm: Normal rate and regular rhythm.     Heart sounds: Normal heart sounds.  Pulmonary:     Effort: Pulmonary effort is normal.     Breath sounds: Normal breath sounds.  Abdominal:     General: Bowel sounds are normal.     Palpations: Abdomen is soft.  Musculoskeletal:        General: No tenderness or deformity. Normal range of motion.     Cervical back: Normal range of motion.  Lymphadenopathy:     Cervical: No cervical adenopathy.  Skin:    General: Skin is warm and dry.     Findings: No erythema or rash.  Neurological:     Mental Status: She is alert and oriented to person, place, and time.  Psychiatric:        Behavior: Behavior normal.        Thought Content: Thought content normal.        Judgment: Judgment normal.      Lab Results  Component Value Date   WBC 5.7 09/07/2020   HGB 11.3 (L) 09/07/2020   HCT 35.9 (L) 09/07/2020   MCV 87.3 09/07/2020    PLT 540 (H) 09/07/2020   Lab Results  Component Value Date   FERRITIN 168 05/19/2020   IRON 69 05/19/2020   TIBC 215 (L) 05/19/2020   UIBC 146 05/19/2020   IRONPCTSAT 32 05/19/2020   Lab Results  Component Value Date   RETICCTPCT 0.8 06/20/2019   RBC 4.11 09/07/2020   RETICCTABS 73.1 08/06/2015   No results found for: KPAFRELGTCHN, LAMBDASER, KAPLAMBRATIO No results found for: IGGSERUM, IGA, IGMSERUM  No results found for: Odetta Pink, SPEI   Chemistry      Component Value Date/Time   NA 139 09/07/2020 0838   NA 145 11/24/2017 0754   NA 142 05/25/2017 0755   K 3.9 09/07/2020 0838   K 3.7 11/24/2017 0754   K 3.9 05/25/2017 0755   CL 105 09/07/2020 0838   CL 104 11/24/2017 0754   CO2 29 09/07/2020 0838   CO2 27 11/24/2017 0754   CO2 24 05/25/2017 0755   BUN 12 09/07/2020 0838   BUN 12 11/24/2017 0754   BUN 13.3 05/25/2017 0755   CREATININE 0.99 09/07/2020 0838   CREATININE 1.0 11/24/2017 0754   CREATININE 0.8 05/25/2017 0755      Component Value Date/Time   CALCIUM 9.4 09/07/2020 0838   CALCIUM 9.1 11/24/2017 0754   CALCIUM 9.5 05/25/2017 0755   ALKPHOS 73 09/07/2020 0838   ALKPHOS 70 11/24/2017 0754   ALKPHOS 74 05/25/2017 0755   AST 14 (L) 09/07/2020 0838   AST 21 05/25/2017 0755   ALT 10 09/07/2020 0838   ALT 18 11/24/2017 0754   ALT 14 05/25/2017 0755   BILITOT 0.4 09/07/2020 0838   BILITOT 0.37 05/25/2017 0755       Impression and Plan: Ms. Divelbiss is a very pleasant 74 yo African American female with essential thrombocythemia, that is JAK2 positive.  In addition, she has the sickle cell trait.  I am just dismayed that her platelet count is up.  I do not want to change the Hydrea.  I think the next choice for [place] would be anagrelide.  I will plan to get her back in about a month.  At that time, we will see if we need to make a change to something different than Hydrea.         [name],  MD 10/4/20219:59 AM

## 2020-09-08 LAB — IGG, IGA, IGM
IgA: 293 mg/dL (ref 64–422)
IgG (Immunoglobin G), Serum: 685 mg/dL (ref 586–1602)
IgM (Immunoglobulin M), Srm: 26 mg/dL (ref 26–217)

## 2020-09-14 ENCOUNTER — Other Ambulatory Visit: Payer: Self-pay | Admitting: *Deleted

## 2020-09-14 NOTE — Patient Outreach (Signed)
[place] [facility]) Care Management  09/14/2020  [name] 06-Sep-1946 [id number]  Unsuccessful outreach attempt made to patient. Patient answered the phone and explained that she was at the doctor's office and could not speak.   Plan: RN Health Coach will call patient within the month of November/  Emelia Loron RN, BSN [place] [phone] [email or web address]

## 2020-09-29 ENCOUNTER — Ambulatory Visit: Payer: Medicare Other | Attending: Family

## 2020-09-29 DIAGNOSIS — Z23 Encounter for immunization: Secondary | ICD-10-CM

## 2020-10-05 ENCOUNTER — Ambulatory Visit: Payer: Medicare Other | Admitting: Hematology & Oncology

## 2020-10-05 ENCOUNTER — Other Ambulatory Visit: Payer: Medicare Other

## 2020-10-09 DIAGNOSIS — D473 Essential (hemorrhagic) thrombocythemia: Secondary | ICD-10-CM | POA: Diagnosis not present

## 2020-10-09 DIAGNOSIS — I700 Atherosclerosis of aorta: Secondary | ICD-10-CM | POA: Diagnosis not present

## 2020-10-09 DIAGNOSIS — E782 Mixed hyperlipidemia: Secondary | ICD-10-CM | POA: Diagnosis not present

## 2020-10-09 DIAGNOSIS — J4520 Mild intermittent asthma, uncomplicated: Secondary | ICD-10-CM | POA: Diagnosis not present

## 2020-10-12 ENCOUNTER — Inpatient Hospital Stay: Payer: Medicare Other | Admitting: Hematology & Oncology

## 2020-10-12 ENCOUNTER — Inpatient Hospital Stay: Payer: Medicare Other | Attending: Hematology & Oncology

## 2020-10-16 DIAGNOSIS — R079 Chest pain, unspecified: Secondary | ICD-10-CM | POA: Diagnosis not present

## 2020-10-16 DIAGNOSIS — I700 Atherosclerosis of aorta: Secondary | ICD-10-CM | POA: Diagnosis not present

## 2020-10-16 DIAGNOSIS — D473 Essential (hemorrhagic) thrombocythemia: Secondary | ICD-10-CM | POA: Diagnosis not present

## 2020-10-16 DIAGNOSIS — E782 Mixed hyperlipidemia: Secondary | ICD-10-CM | POA: Diagnosis not present

## 2020-10-21 ENCOUNTER — Other Ambulatory Visit: Payer: Self-pay | Admitting: *Deleted

## 2020-10-21 NOTE — Patient Outreach (Signed)
[place] [facility]) Care Management  [place]  10/21/2020   [name] Jun 13, 1946 [id number]  Subjective: Successful telephone outreach call to patient. HIPAA identifiers obtained. Patient reports she is doing very well. Patient states she got an excellent report at her last PCP appointment on 08/27/20. Patient explains that her COPD is under control and at baseline. She has had this years flu shot, received the covid booster, is wearing her mask, avoids crowds, and washes her hands frequently. She follows the COPD rescue plan that her and Dr. Lamonte Sakai developed together. Patient denies having any falls and reports walking with her daughter in stores and up and down her street to help with her respiratory status and maintain her strength. She explains that her appetite has improved, she is staying hydrated by drinking plenty of water, and she denies any concerning weight loss. Patient is very well supported by her family and does not have any further questions or concerns today. She did confirm that she has this nurse's contact number to call if needed.   Encounter Medications:  Outpatient Encounter Medications as of 10/21/2020  Medication Sig  . albuterol (PROVENTIL HFA) 108 (90 Base) MCG/ACT inhaler Inhale 2 puffs into the lungs every 4 (four) hours as needed for wheezing or shortness of breath. Reported on 12/30/2015  . albuterol (PROVENTIL) (2.5 MG/3ML) 0.083% nebulizer solution Take 3 mLs (2.5 mg total) by nebulization every 4 (four) hours as needed for shortness of breath. Reported on 12/30/2015  . aspirin EC 81 MG tablet Take 81 mg by mouth daily.  . Budeson-Glycopyrrol-Formoterol (BREZTRI AEROSPHERE) 160-9-4.8 MCG/ACT AERO Inhale 2 puffs into the lungs in the morning and at bedtime.  . cholecalciferol (VITAMIN D) 1000 UNITS tablet Take 1,000 Units by mouth daily.  . fluticasone (FLONASE) 50 MCG/ACT nasal spray Place 1 spray into the nose 2 (two) times daily.   . folic  acid (FOLVITE) 1 MG tablet Take 1 tablet (1 mg total) by mouth daily.  Marland Kitchen gabapentin (NEURONTIN) 100 MG capsule SMARTSIG:2 Capsule(s) By Mouth Every Evening  . hydroxyurea (HYDREA) 500 MG capsule Take 1 capsule (500 mg total) by mouth 3 (three) times daily before meals.  . Magnesium 200 MG TABS Take 1 tablet by mouth 2 (two) times daily.   . montelukast (SINGULAIR) 10 MG tablet TAKE 1 TABLET BY MOUTH EVERYDAY AT BEDTIME (Patient taking differently: Take 10 mg by mouth at bedtime. )  . pantoprazole (PROTONIX) 40 MG tablet Take 1 tablet (40 mg total) by mouth 2 (two) times daily. Take 30- 60 min before your first and last meals of the day  . pravastatin (PRAVACHOL) 40 MG tablet Take 40 mg by mouth daily.  . promethazine-dextromethorphan (PROMETHAZINE-DM) 6.25-15 MG/5ML syrup Take 5 mLs by mouth 4 (four) times daily as needed for cough.   Facility-Administered Encounter Medications as of 10/21/2020  Medication  . 0.9 %  sodium chloride infusion    Functional Status:  In your present state of health, do you have any difficulty performing the following activities: 05/05/2020  Hearing? N  Vision? N  Difficulty concentrating or making decisions? N  Walking or climbing stairs? N  Dressing or bathing? N  Doing errands, shopping? N  Preparing Food and eating ? N  Using the Toilet? N  In the past six months, have you accidently leaked urine? N  Do you have problems with loss of bowel control? N  Managing your Medications? N  Managing your Finances? N  Housekeeping or managing your  Housekeeping? N  Some recent data might be hidden    Fall/Depression Screening: Fall Risk  10/21/2020 07/29/2020 05/05/2020  Falls in the past year? 0 0 0  Number falls in past yr: 0 0 0  Injury with Fall? 0 0 0  Comment - - -  Risk Factor Category  - - -  Risk for fall due to : - - -  Follow up Falls evaluation completed Falls prevention discussed;Education provided;Falls evaluation completed Falls prevention  discussed;Education provided;Falls evaluation completed   PHQ 2/9 Scores 07/29/2020 05/05/2020 06/11/2019 06/10/2019 06/10/2019 03/29/2018 01/31/2014  PHQ - 2 Score 0 0 1 0 0 0 0    Assessment:  Goals Addressed            This Visit's Progress   . Patient will not go to the ED or will not be hospitalized for COPD exacerbation within the next 90 days   On track    [place] (see longtitudinal plan of care for additional care plan information)  Current Barriers:  Marland Kitchen Knowledge deficits related to basic COPD self care/management . Cannot afford prescribed medications   Case Manager Clinical Goal(s):  Over the next 90 days patient will report using inhalers as prescribed including rinsing mouth after use  Over the next 90 days patient will report utilizing pursed lip breathing for shortness of breath  Over the next 90 days, patient will be able to verbalize understanding of COPD action plan and when to seek appropriate levels of medical care  Over the next 90 days, patient will engage in lite exercise as tolerated to build/regain stamina and strength and reduce shortness of breath through activity tolerance  Over the next 90 days, patient will verbalize basic understanding of COPD disease process and self care activities  Over the next 90 days, patient will not be hospitalized for COPD exacerbation   Interventions:   Provided patient with basic written and verbal COPD education on self care/management/and exacerbation prevention   Provided patient with COPD action plan and reinforced importance of daily self assessment  Provided written and verbal instructions on pursed lip breathing and utilized returned demonstration as teach back  Advised patient to self assesses COPD action plan zone and make appointment with provider if in the yellow zone for 48 hours without improvement.  Provided patient with education about the role of exercise in the management of COPD  Nurse encouraged  patient to continue taking respiratory medications as prescribed and as needed, discussed increasing physical activity as tolerated to help lungs function properly and to maintain strength, discusses supplementing with Ensure due to patient's decreased appetite (she denies having difficulty eating due to SOB), reviewed COPD action plan she and Dr. Lamonte Sakai have in place and encouraged doctor contact as needed.   Patient Self Care Activities:  . Takes medications as prescribed including inhalers . Self assesses COPD action plan zone and makes appointment with provider if in the yellow zone for 48 hours without improvement. . Patient states she keeps moving during the day doing needed tasks around the house. She does  sit to rest as needed and will get back up when she is no longer SOB with a goal of not being sedentary.  . Patient reports she and daughter walk around stores and are walking up and down her street routinely to help with her respiratory status.  . Patient will receive assistance for her medications until December. Nurse provided patient with a pharmacy contact number to call regarding her  medication assistance of which she should call if she has not heard from pharmacy by December.  . No self care deficits at this time  Please see past updates related to this goal by clicking on the "Past Updates" button in the selected goal   Updated: 10/21/20         . Track and Manage My Symptoms       Follow Up Date 01/03/21    - develop a rescue plan - eliminate symptom triggers at home - follow rescue plan if symptoms flare-up - keep follow-up appointments    Why is this important?   Tracking your symptoms and other information about your health helps your doctor plan your care.  Write down the symptoms, the time of day, what you were doing and what medicine you are taking.  You will soon learn how to manage your symptoms.     Notes: Patient has a COPD rescue plan. She contacts Dr. Lamonte Sakai  as needed. Patient has received this years flu shot and covid booster. She wears her mask, avoids crowded areas, and washes her hands frequently.     . Track and Manage My Triggers       Follow Up Date 01/03/21    - avoid second hand smoke - identify and remove indoor air pollutants - limit outdoor activity during cold weather - listen for public air quality announcements every day    Why is this important?   Triggers are activities or things, like tobacco smoke or cold weather, that make your COPD (chronic obstructive pulmonary disease) flare-up.  Knowing these triggers helps you plan how to stay away from them.  When you cannot remove them, you can learn how to manage them.     Notes: Patient reports being cautious regarding her COPD triggers and avoids weather and poor air quality conditions as indicated.      Plan: RN Health Coach will send PCP today's assessment note, will send patient Ensure coupons and Advance Directive documents, will call patient within the month of January, and patient agrees to future outreach calls.   Emelia Loron RN, BSN [place] [phone] [email or web address]

## 2020-10-21 NOTE — Patient Instructions (Addendum)
Goals Addressed            This Visit's Progress    Patient will not go to the ED or will not be hospitalized for COPD exacerbation within the next 90 days   On track    [employer] (see longtitudinal plan of care for additional care plan information)  Current Barriers:   Knowledge deficits related to basic COPD self care/management  Cannot afford prescribed medications   Case Manager Clinical Goal(s):  Over the next 90 days patient will report using inhalers as prescribed including rinsing mouth after use  Over the next 90 days patient will report utilizing pursed lip breathing for shortness of breath  Over the next 90 days, patient will be able to verbalize understanding of COPD action plan and when to seek appropriate levels of medical care  Over the next 90 days, patient will engage in lite exercise as tolerated to build/regain stamina and strength and reduce shortness of breath through activity tolerance  Over the next 90 days, patient will verbalize basic understanding of COPD disease process and self care activities  Over the next 90 days, patient will not be hospitalized for COPD exacerbation   Interventions:   Provided patient with basic written and verbal COPD education on self care/management/and exacerbation prevention   Provided patient with COPD action plan and reinforced importance of daily self assessment  Provided written and verbal instructions on pursed lip breathing and utilized returned demonstration as teach back  Advised patient to self assesses COPD action plan zone and make appointment with provider if in the yellow zone for 48 hours without improvement.  Provided patient with education about the role of exercise in the management of COPD  Nurse encouraged patient to continue taking respiratory medications as prescribed and as needed, discussed increasing physical activity as tolerated to help lungs function properly and to maintain strength,  discusses supplementing with Ensure due to patient's decreased appetite (she denies having difficulty eating due to SOB), reviewed COPD action plan she and Dr. Lamonte Sakai have in place and encouraged doctor contact as needed.   Patient Self Care Activities:   Takes medications as prescribed including inhalers  Self assesses COPD action plan zone and makes appointment with provider if in the yellow zone for 48 hours without improvement.  Patient states she keeps moving during the day doing needed tasks around the house. She does  sit to rest as needed and will get back up when she is no longer SOB with a goal of not being sedentary.   Patient reports she and daughter walk around stores and are walking up and down her street routinely to help with her respiratory status.   Patient will receive assistance for her medications until December. Nurse provided patient with a pharmacy contact number to call regarding her medication assistance of which she should call if she has not heard from pharmacy by December.   No self care deficits at this time  Please see past updates related to this goal by clicking on the "Past Updates" button in the selected goal   Updated: 10/21/20          Track and Manage My Symptoms       Follow Up Date 01/03/21    - develop a rescue plan - eliminate symptom triggers at home - follow rescue plan if symptoms flare-up - keep follow-up appointments    Why is this important?   Tracking your symptoms and other information about your health  helps your doctor plan your care.  Write down the symptoms, the time of day, what you were doing and what medicine you are taking.  You will soon learn how to manage your symptoms.     Notes: Patient has a COPD rescue plan. She contacts Dr. Lamonte Sakai as needed. Patient has received this years flu shot and covid booster. She wears her mask, avoids crowded areas, and washes her hands frequently.      Track and Manage My Triggers        Follow Up Date 01/03/21    - avoid second hand smoke - identify and remove indoor air pollutants - limit outdoor activity during cold weather - listen for public air quality announcements every day    Why is this important?   Triggers are activities or things, like tobacco smoke or cold weather, that make your COPD (chronic obstructive pulmonary disease) flare-up.  Knowing these triggers helps you plan how to stay away from them.  When you cannot remove them, you can learn how to manage them.     Notes: Patient reports being cautious regarding her COPD triggers and avoids weather and poor air quality conditions as indicated.

## 2020-10-26 ENCOUNTER — Telehealth: Payer: Self-pay

## 2020-10-26 NOTE — Telephone Encounter (Signed)
S/w pt per inbasket, her missed appts have been r/s and a calendar has been mailed to her as well///// AOM

## 2020-11-18 NOTE — Progress Notes (Signed)
   Covid-19 Vaccination Clinic  Name:  [name]    MRN: [id number] DOB: 10/30/1946  11/18/2020  [name] was observed post Covid-19 immunization for 15 minutes without incident. She was provided with Vaccine Information Sheet and instruction to access the V-Safe system.   [name] was instructed to call 911 with any severe reactions post vaccine: Marland Kitchen Difficulty breathing  . Swelling of face and throat  . A fast heartbeat  . A bad rash all over body  . Dizziness and weakness   Immunizations Administered    No immunizations on file.

## 2020-12-07 ENCOUNTER — Inpatient Hospital Stay: Payer: Medicare Other

## 2020-12-07 ENCOUNTER — Inpatient Hospital Stay: Payer: Medicare Other | Admitting: Hematology & Oncology

## 2020-12-21 ENCOUNTER — Other Ambulatory Visit: Payer: Self-pay | Admitting: *Deleted

## 2020-12-21 NOTE — Patient Outreach (Signed)
[place] [facility]) Care Management  12/21/2020  [name] 1946/04/26 [id number]  Unsuccessful outreach attempt made to patient. RN Health Coach left HIPAA compliant voicemail message along with her contact information.  Plan: RN Health Coach will call patient within the month of February.  Emelia Loron RN, BSN [place] [phone] [email or web address]

## 2020-12-29 ENCOUNTER — Other Ambulatory Visit: Payer: Self-pay | Admitting: Internal Medicine

## 2020-12-31 DIAGNOSIS — H2513 Age-related nuclear cataract, bilateral: Secondary | ICD-10-CM | POA: Diagnosis not present

## 2020-12-31 DIAGNOSIS — H524 Presbyopia: Secondary | ICD-10-CM | POA: Diagnosis not present

## 2020-12-31 DIAGNOSIS — H52222 Regular astigmatism, left eye: Secondary | ICD-10-CM | POA: Diagnosis not present

## 2021-01-21 DIAGNOSIS — H25812 Combined forms of age-related cataract, left eye: Secondary | ICD-10-CM | POA: Diagnosis not present

## 2021-01-21 DIAGNOSIS — H25811 Combined forms of age-related cataract, right eye: Secondary | ICD-10-CM | POA: Diagnosis not present

## 2021-01-27 ENCOUNTER — Other Ambulatory Visit: Payer: Self-pay | Admitting: *Deleted

## 2021-01-27 NOTE — Patient Outreach (Signed)
[place] [facility]) Care Management  01/27/2021  [name] 1946-10-27 [id number]  Unsuccessful outreach attempt made to patient. Patient answered the phone and stated that she would not be able to speak today. Patient did request that this nurse call back at a later date.   Plan: RN Health Coach will call patient within the month of March.  Emelia Loron RN, BSN [place] [phone] [name]

## 2021-02-01 DIAGNOSIS — K219 Gastro-esophageal reflux disease without esophagitis: Secondary | ICD-10-CM | POA: Diagnosis not present

## 2021-02-01 DIAGNOSIS — J4541 Moderate persistent asthma with (acute) exacerbation: Secondary | ICD-10-CM | POA: Diagnosis not present

## 2021-02-01 DIAGNOSIS — E782 Mixed hyperlipidemia: Secondary | ICD-10-CM | POA: Diagnosis not present

## 2021-02-01 DIAGNOSIS — J45909 Unspecified asthma, uncomplicated: Secondary | ICD-10-CM | POA: Diagnosis not present

## 2021-02-11 DIAGNOSIS — H25811 Combined forms of age-related cataract, right eye: Secondary | ICD-10-CM | POA: Diagnosis not present

## 2021-02-11 DIAGNOSIS — H2511 Age-related nuclear cataract, right eye: Secondary | ICD-10-CM | POA: Diagnosis not present

## 2021-02-17 ENCOUNTER — Encounter: Payer: Self-pay | Admitting: Internal Medicine

## 2021-02-19 ENCOUNTER — Telehealth: Payer: Self-pay | Admitting: Emergency Medicine

## 2021-02-19 MED ORDER — PANTOPRAZOLE SODIUM 40 MG PO TBEC: 40.0000 mg | DELAYED_RELEASE_TABLET | Freq: Two times a day (BID) | ORAL | 1 refills | Status: DC

## 2021-02-19 NOTE — Telephone Encounter (Signed)
Refill has been sent to the pharmacy requested by the pt.

## 2021-02-20 ENCOUNTER — Other Ambulatory Visit: Payer: Self-pay | Admitting: Emergency Medicine

## 2021-02-20 ENCOUNTER — Emergency Department (HOSPITAL_COMMUNITY): Payer: Medicare Other

## 2021-02-20 ENCOUNTER — Other Ambulatory Visit: Payer: Self-pay

## 2021-02-20 ENCOUNTER — Encounter (HOSPITAL_COMMUNITY): Payer: Self-pay | Admitting: Emergency Medicine

## 2021-02-20 ENCOUNTER — Inpatient Hospital Stay (HOSPITAL_COMMUNITY)
Admission: EM | Admit: 2021-02-20 | Discharge: 2021-02-24 | DRG: 203 | Disposition: A | Discharge status: Home / Self Care | Payer: Medicare Other | Attending: Family Medicine | Admitting: Family Medicine

## 2021-02-20 DIAGNOSIS — R0602 Shortness of breath: Secondary | ICD-10-CM | POA: Diagnosis not present

## 2021-02-20 DIAGNOSIS — E669 Obesity, unspecified: Secondary | ICD-10-CM | POA: Diagnosis present

## 2021-02-20 DIAGNOSIS — Z8673 Personal history of transient ischemic attack (TIA), and cerebral infarction without residual deficits: Secondary | ICD-10-CM

## 2021-02-20 DIAGNOSIS — K449 Diaphragmatic hernia without obstruction or gangrene: Secondary | ICD-10-CM | POA: Diagnosis not present

## 2021-02-20 DIAGNOSIS — Z79899 Other long term (current) drug therapy: Secondary | ICD-10-CM

## 2021-02-20 DIAGNOSIS — R0603 Acute respiratory distress: Secondary | ICD-10-CM | POA: Diagnosis present

## 2021-02-20 DIAGNOSIS — M810 Age-related osteoporosis without current pathological fracture: Secondary | ICD-10-CM | POA: Diagnosis present

## 2021-02-20 DIAGNOSIS — Z825 Family history of asthma and other chronic lower respiratory diseases: Secondary | ICD-10-CM | POA: Diagnosis not present

## 2021-02-20 DIAGNOSIS — D573 Sickle-cell trait: Secondary | ICD-10-CM | POA: Diagnosis present

## 2021-02-20 DIAGNOSIS — Z880 Allergy status to penicillin: Secondary | ICD-10-CM | POA: Diagnosis not present

## 2021-02-20 DIAGNOSIS — Z7951 Long term (current) use of inhaled steroids: Secondary | ICD-10-CM

## 2021-02-20 DIAGNOSIS — M170 Bilateral primary osteoarthritis of knee: Secondary | ICD-10-CM | POA: Diagnosis present

## 2021-02-20 DIAGNOSIS — Z90710 Acquired absence of both cervix and uterus: Secondary | ICD-10-CM

## 2021-02-20 DIAGNOSIS — Z20822 Contact with and (suspected) exposure to covid-19: Secondary | ICD-10-CM | POA: Diagnosis present

## 2021-02-20 DIAGNOSIS — D473 Essential (hemorrhagic) thrombocythemia: Secondary | ICD-10-CM | POA: Diagnosis not present

## 2021-02-20 DIAGNOSIS — J449 Chronic obstructive pulmonary disease, unspecified: Secondary | ICD-10-CM | POA: Diagnosis not present

## 2021-02-20 DIAGNOSIS — R7303 Prediabetes: Secondary | ICD-10-CM | POA: Diagnosis not present

## 2021-02-20 DIAGNOSIS — R739 Hyperglycemia, unspecified: Secondary | ICD-10-CM | POA: Diagnosis not present

## 2021-02-20 DIAGNOSIS — Z7982 Long term (current) use of aspirin: Secondary | ICD-10-CM

## 2021-02-20 DIAGNOSIS — J45901 Unspecified asthma with (acute) exacerbation: Secondary | ICD-10-CM | POA: Diagnosis not present

## 2021-02-20 DIAGNOSIS — E785 Hyperlipidemia, unspecified: Secondary | ICD-10-CM | POA: Diagnosis not present

## 2021-02-20 DIAGNOSIS — Z86010 Personal history of colonic polyps: Secondary | ICD-10-CM

## 2021-02-20 DIAGNOSIS — Z6830 Body mass index (BMI) 30.0-30.9, adult: Secondary | ICD-10-CM

## 2021-02-20 DIAGNOSIS — K219 Gastro-esophageal reflux disease without esophagitis: Secondary | ICD-10-CM | POA: Diagnosis present

## 2021-02-20 DIAGNOSIS — J4541 Moderate persistent asthma with (acute) exacerbation: Secondary | ICD-10-CM | POA: Diagnosis not present

## 2021-02-20 DIAGNOSIS — J383 Other diseases of vocal cords: Secondary | ICD-10-CM | POA: Diagnosis not present

## 2021-02-20 DIAGNOSIS — J209 Acute bronchitis, unspecified: Secondary | ICD-10-CM

## 2021-02-20 DIAGNOSIS — R0600 Dyspnea, unspecified: Secondary | ICD-10-CM | POA: Diagnosis present

## 2021-02-20 LAB — BASIC METABOLIC PANEL
Anion gap: 11 (ref 5–15)
BUN: 15 mg/dL (ref 8–23)
CO2: 22 mmol/L (ref 22–32)
Calcium: 9.3 mg/dL (ref 8.9–10.3)
Chloride: 104 mmol/L (ref 98–111)
Creatinine, Ser: 1.03 mg/dL — ABNORMAL HIGH (ref 0.44–1.00)
GFR, Estimated: 57 mL/min — ABNORMAL LOW (ref >60)
Glucose, Bld: 200 mg/dL — ABNORMAL HIGH (ref 70–99)
Potassium: 4 mmol/L (ref 3.5–5.1)
Sodium: 137 mmol/L (ref 135–145)

## 2021-02-20 LAB — BLOOD GAS, VENOUS
Acid-Base Excess: 2.3 mmol/L — ABNORMAL HIGH (ref 0.0–2.0)
Bicarbonate: 27.6 mmol/L (ref 20.0–28.0)
FIO2: 21
O2 Saturation: 91.8 %
Patient temperature: 98.6
pCO2, Ven: 48 mmHg (ref 44.0–60.0)
pH, Ven: 7.378 (ref 7.250–7.430)
pO2, Ven: 61.9 mmHg — ABNORMAL HIGH (ref 32.0–45.0)

## 2021-02-20 LAB — CBC
HCT: 38.1 % (ref 36.0–46.0)
Hemoglobin: 12.1 g/dL (ref 12.0–15.0)
MCH: 27.4 pg (ref 26.0–34.0)
MCHC: 31.8 g/dL (ref 30.0–36.0)
MCV: 86.4 fL (ref 80.0–100.0)
Platelets: 556 10*3/uL — ABNORMAL HIGH (ref 150–400)
RBC: 4.41 MIL/uL (ref 3.87–5.11)
RDW: 16.6 % — ABNORMAL HIGH (ref 11.5–15.5)
WBC: 9.3 10*3/uL (ref 4.0–10.5)
nRBC: 0 % (ref 0.0–0.2)

## 2021-02-20 LAB — RESP PANEL BY RT-PCR (FLU A&B, COVID) ARPGX2
Influenza A by PCR: NEGATIVE
Influenza B by PCR: NEGATIVE
SARS Coronavirus 2 by RT PCR: NEGATIVE

## 2021-02-20 MED ORDER — METHYLPREDNISOLONE SODIUM SUCC 125 MG IJ SOLR
125.0000 mg | Freq: Once | INTRAMUSCULAR | Status: AC
  Administered 2021-02-20: 125 mg via INTRAVENOUS
  Filled 2021-02-20: qty 2

## 2021-02-20 MED ORDER — IPRATROPIUM BROMIDE 0.02 % IN SOLN
0.5000 mg | Freq: Once | RESPIRATORY_TRACT | Status: AC
  Administered 2021-02-20: 0.5 mg via RESPIRATORY_TRACT
  Filled 2021-02-20: qty 2.5

## 2021-02-20 MED ORDER — ALBUTEROL (5 MG/ML) CONTINUOUS INHALATION SOLN
10.0000 mg/h | INHALATION_SOLUTION | Freq: Once | RESPIRATORY_TRACT | Status: AC
  Administered 2021-02-20: 10 mg/h via RESPIRATORY_TRACT
  Filled 2021-02-20: qty 20

## 2021-02-20 MED ORDER — MAGNESIUM SULFATE IN D5W 1-5 GM/100ML-% IV SOLN
1.0000 g | Freq: Once | INTRAVENOUS | Status: AC
  Administered 2021-02-20: 1 g via INTRAVENOUS
  Filled 2021-02-20: qty 100

## 2021-02-20 MED ORDER — SODIUM CHLORIDE 0.9 % IV SOLN: INTRAVENOUS | Status: DC | PRN

## 2021-02-20 MED ORDER — ALBUTEROL SULFATE (2.5 MG/3ML) 0.083% IN NEBU
5.0000 mg | INHALATION_SOLUTION | Freq: Once | RESPIRATORY_TRACT | Status: AC
  Administered 2021-02-20: 5 mg via RESPIRATORY_TRACT
  Filled 2021-02-20: qty 6

## 2021-02-20 NOTE — ED Notes (Signed)
Respiratory made aware of continuous neb order.

## 2021-02-20 NOTE — ED Provider Notes (Signed)
[facility] DEPT Provider Note   CSN: [id number] Arrival date & time: 02/20/21  1937     History Chief Complaint  Patient presents with  . Asthma    [name] is a 75 y.o. female.  HPI   Patient presents to the ED for evaluation of shortness of breath.  Patient feels like her asthma has been acting up.  Symptoms started several days ago.  She has been trying to treat herself at home with albuterol treatments.  Unfortunately her wheezing and shortness of breath persist.  She continues to cough.  She is not having any fevers.  No nausea vomiting.  No leg swelling.  No chest pain.  Past Medical History:  Diagnosis Date  . Allergic rhinitis   . Anemia   . Asthma   . Chronic bronchitis ([place])   . Colonic polyp   . DDD (degenerative disc disease), lumbar 07/20/2011  . DJD (degenerative joint disease) of knee    bilateral  . DJD (degenerative joint disease), lumbar 07/20/2011  . Esophageal stricture   . GERD (gastroesophageal reflux disease)   . Hiatal hernia   . Hyperlipidemia   . Osteoporosis   . Stroke [facility])    "didn't know I'd had one before I was told; didn't do any damage" (11/24/2016)  . Vocal cord dysfunction     Patient Active Problem List   Diagnosis Date Noted  . Asthma-COPD overlap syndrome ([place]) 07/08/2020  . On methotrexate therapy 07/10/2018  . Abnormal laboratory test 03/29/2018  . Sickle cell trait ([place]) 01/20/2017  . JAK-2 gene mutation 12/06/2016  . AKI (acute kidney injury) ([place]) 11/24/2016  . Right shoulder pain 11/24/2016  . Acute respiratory distress 11/24/2016  . RLS (restless legs syndrome) 11/24/2016  . Restless leg syndrome 10/13/2016  . Arthritis of knee, degenerative 02/18/2015  . Shingles 12/19/2014  . ILD (interstitial lung disease) ([place])   . Atelectasis   . Essential thrombocythemia ([place]) 12/24/2013  . History of arthroscopy of knee 11/11/2013  . TIA (transient ischemic attack) 09/01/2012  . Chest  pain 09/01/2012  . Arm numbness left 06/03/2012  . Migraine 05/31/2012  . Dyspnea on exertion 08/09/2011  . DDD (degenerative disc disease), lumbar 07/20/2011  . DJD (degenerative joint disease), lumbar 07/20/2011  . CVA (cerebral infarction) 07/20/2011  . Acute lumbar radiculopathy 07/12/2011  . Preventative health care 07/10/2011  . TRIGGER FINGER, RIGHT MIDDLE 01/11/2011  . Other dysphagia 01/11/2011  . MENOPAUSAL DISORDER 07/05/2010  . OSTEOARTHRITIS, KNEES, BILATERAL 07/05/2010  . NEVUS 05/20/2010  . VAGINITIS 03/23/2010  . SUPERFICIAL THROMBOPHLEBITIS 03/20/2009  . FATIGUE 03/20/2009  . DYSPHAGIA UNSPECIFIED 02/16/2009  . Snoring 11/05/2008  . HERPES ZOSTER 06/30/2008  . Diabetes ([place]) 06/30/2008  . Hyperlipidemia 06/30/2008  . ESOPHAGEAL STRICTURE 06/30/2008  . FOOT PAIN, RIGHT 06/30/2008  . OSTEOPOROSIS 06/30/2008  . COLONIC POLYPS, HX OF 06/30/2008  . PANIC ATTACK 09/05/2007  . Allergic rhinitis 09/05/2007  . Vocal cord dysfunction 09/05/2007  . Intrinsic asthma 09/05/2007  . GERD (gastroesophageal reflux disease) 09/05/2007    Past Surgical History:  Procedure Laterality Date  . CARPAL TUNNEL RELEASE Bilateral   . COLONOSCOPY W/ BIOPSIES AND POLYPECTOMY    . ESOPHAGOGASTRODUODENOSCOPY (EGD) WITH ESOPHAGEAL DILATION    . FOOT SURGERY Bilateral    "might have been bunions or hammertoes"  . KNEE ARTHROSCOPY Left 11/06/2013  . TOTAL ABDOMINAL HYSTERECTOMY    . VIDEO BRONCHOSCOPY Bilateral 12/12/2014   Procedure: VIDEO BRONCHOSCOPY WITHOUT FLUORO;  Surgeon: Chesley Mires,  MD;  Location: [facility];  Service: Cardiopulmonary;  Laterality: Bilateral;     OB History   No obstetric history on file.     Family History  Problem Relation Age of Onset  . Heart attack Mother   . Colon cancer Father 35  . Cancer Sister        unknown type  . Asthma Brother   . Asthma Other        uncle  . Stomach cancer Neg Hx   . Esophageal cancer Neg Hx   . Rectal cancer Neg  Hx     Social History   Tobacco Use  . Smoking status: Former Smoker    Packs/day: 0.10    Years: 30.00    Pack years: 3.00    Types: Cigarettes    Start date: 01/31/1975    Quit date: 08/09/1995    Years since quitting: 25.5  . Smokeless tobacco: Never Used  Vaping Use  . Vaping Use: Never used  Substance Use Topics  . Alcohol use: No    Alcohol/week: 0.0 standard drinks  . Drug use: No    Home Medications Prior to Admission medications   Medication Sig Start Date End Date Taking? Authorizing Provider  albuterol (PROVENTIL HFA) 108 (90 Base) MCG/ACT inhaler Inhale 2 puffs into the lungs every 4 (four) hours as needed for wheezing or shortness of breath. Reported on 12/30/2015 05/25/20   Collene Gobble, MD  albuterol (PROVENTIL) (2.5 MG/3ML) 0.083% nebulizer solution Take 3 mLs (2.5 mg total) by nebulization every 4 (four) hours as needed for shortness of breath. Reported on 12/30/2015 01/08/20   Collene Gobble, MD  aspirin EC 81 MG tablet Take 81 mg by mouth daily.    [provider]  Budeson-Glycopyrrol-Formoterol (BREZTRI AEROSPHERE) 160-9-4.8 MCG/ACT AERO Inhale 2 puffs into the lungs in the morning and at bedtime. 06/17/20   Collene Gobble, MD  cholecalciferol (VITAMIN D) 1000 UNITS tablet Take 1,000 Units by mouth daily.    [provider]  fluticasone (FLONASE) 50 MCG/ACT nasal spray Place 1 spray into the nose 2 (two) times daily.     [provider]  folic acid (FOLVITE) 1 MG tablet Take 1 tablet (1 mg total) by mouth daily. 06/03/15   [place], Holli Humbles, NP  gabapentin (NEURONTIN) 100 MG capsule SMARTSIG:2 Capsule(s) By Mouth Every Evening 08/27/20   [provider]  hydroxyurea (HYDREA) 500 MG capsule Take 1 capsule (500 mg total) by mouth 3 (three) times daily before meals. 03/02/18   [name], MD  Magnesium 200 MG TABS Take 1 tablet by mouth 2 (two) times daily.     [provider]  montelukast (SINGULAIR) 10 MG tablet TAKE  1 TABLET BY MOUTH EVERYDAY AT BEDTIME Patient taking differently: Take 10 mg by mouth at bedtime.  02/26/20   Collene Gobble, MD  pantoprazole (PROTONIX) 40 MG tablet Take 1 tablet (40 mg total) by mouth 2 (two) times daily. Take 30- 60 min before your first and last meals of the day 02/19/21   Collene Gobble, MD  pravastatin (PRAVACHOL) 40 MG tablet Take 40 mg by mouth daily.    [provider]  promethazine-dextromethorphan (PROMETHAZINE-DM) 6.25-15 MG/5ML syrup Take 5 mLs by mouth 4 (four) times daily as needed for cough. 07/08/20   Tanda Rockers, MD    Allergies    Penicillins  Review of Systems   Review of Systems  All other systems reviewed and are negative.  Physical Exam Updated Vital Signs BP 122/70   Pulse 89   Temp 98.1 F (36.7 C) (Oral)   Resp 19   SpO2 99%   Physical Exam Vitals and nursing note reviewed.  Constitutional:      General: She is not in acute distress.    Appearance: She is well-developed.  HENT:     Head: Normocephalic and atraumatic.     Right Ear: External ear normal.     Left Ear: External ear normal.  Eyes:     General: No scleral icterus.       Right eye: No discharge.        Left eye: No discharge.     Conjunctiva/sclera: Conjunctivae normal.  Neck:     Trachea: No tracheal deviation.  Cardiovascular:     Rate and Rhythm: Regular rhythm. Tachycardia present.  Pulmonary:     Effort: Pulmonary effort is normal. No respiratory distress.     Breath sounds: No stridor. Wheezing present. No rales.  Abdominal:     General: Bowel sounds are normal. There is no distension.     Palpations: Abdomen is soft.     Tenderness: There is no abdominal tenderness. There is no guarding or rebound.  Musculoskeletal:        General: No tenderness.     Cervical back: Neck supple.  Skin:    General: Skin is warm and dry.     Findings: No rash.  Neurological:     Mental Status: She is alert.     Cranial Nerves: No cranial nerve deficit (no  facial droop, extraocular movements intact, no slurred speech).     Sensory: No sensory deficit.     Motor: No abnormal muscle tone or seizure activity.     Coordination: Coordination normal.     ED Results / Procedures / Treatments   Labs (all labs ordered are listed, but only abnormal results are displayed) Labs Reviewed  BASIC METABOLIC PANEL - Abnormal; Notable for the following components:      Result Value   Glucose, Bld 200 (*)    Creatinine, Ser 1.03 (*)    GFR, Estimated 57 (*)    All other components within normal limits  CBC - Abnormal; Notable for the following components:   RDW 16.6 (*)    Platelets 556 (*)    All other components within normal limits  RESP PANEL BY RT-PCR (FLU A&B, COVID) [id number]    EKG None  Radiology DG Chest Port 1 View  Result Date: 02/20/2021 CLINICAL DATA:  Shortness of breath EXAM: PORTABLE CHEST 1 VIEW COMPARISON:  07/06/2020 FINDINGS: Heart and mediastinal contours are within normal limits. No focal opacities or effusions. No acute bony abnormality. Small hiatal hernia. IMPRESSION: No active disease. Electronically Signed   By: Rolm Baptise M.D.   On: 02/20/2021 20:29    Procedures Procedures   Medications Ordered in ED Medications  magnesium sulfate IVPB 1 g 100 mL (has no administration in time range)  albuterol (PROVENTIL) (2.5 MG/3ML) 0.083% nebulizer solution 5 mg (has no administration in time range)  methylPREDNISolone sodium succinate (SOLU-MEDROL) 125 mg/2 mL injection 125 mg (125 mg Intravenous Given 02/20/21 2048)  albuterol (PROVENTIL,VENTOLIN) solution continuous neb (10 mg/hr Nebulization Given 02/20/21 2102)  ipratropium (ATROVENT) nebulizer solution 0.5 mg (0.5 mg Nebulization Given 02/20/21 2102)    ED Course  I have reviewed the triage vital signs and the nursing notes.  Pertinent labs & imaging results that were available during my care  of the patient were reviewed by me and considered in my medical decision making  (see chart for details).  Clinical Course as of 02/20/21 2221  Sat Feb 20, 2021  2210 Still wheezing on exam.   Persistent increased work of breathing and will order an additional breathing treatment and magnesium [JK]    Clinical Course User Index [JK] Dorie Rank, MD   MDM Rules/Calculators/A&P                          Patient presented to the ED with complaints of shortness of breath wheezing.  Patient with notable wheezing on exam.  Initially treated with albuterol Atrovent treatment as well as steroids.  Patient does have persistent wheezing.  Will order an additional neb treatment and magnesium.  Patient is not requiring oxygen.  If she improves may be able to be discharged but if she continues to have persistent wheezing may require admission to the hospital for further treatment. Final Clinical Impression(s) / ED Diagnoses Final diagnoses:  Exacerbation of asthma, unspecified asthma severity, unspecified whether persistent      Dorie Rank, MD 02/20/21 2222

## 2021-02-20 NOTE — ED Triage Notes (Signed)
Patient presents with a possible exacerbation of her asthma. Patient states she began having some shortness of breath Thursday, and has been attempting to use her albuterol nebulizer and inhaler without success. Patient states cough that is intermittently productive. Denies fevers, nausea and vomiting.

## 2021-02-21 DIAGNOSIS — D473 Essential (hemorrhagic) thrombocythemia: Secondary | ICD-10-CM

## 2021-02-21 DIAGNOSIS — J383 Other diseases of vocal cords: Secondary | ICD-10-CM

## 2021-02-21 DIAGNOSIS — R0603 Acute respiratory distress: Secondary | ICD-10-CM | POA: Diagnosis not present

## 2021-02-21 DIAGNOSIS — R739 Hyperglycemia, unspecified: Secondary | ICD-10-CM

## 2021-02-21 DIAGNOSIS — J4541 Moderate persistent asthma with (acute) exacerbation: Secondary | ICD-10-CM | POA: Diagnosis not present

## 2021-02-21 DIAGNOSIS — K219 Gastro-esophageal reflux disease without esophagitis: Secondary | ICD-10-CM | POA: Diagnosis not present

## 2021-02-21 DIAGNOSIS — J45901 Unspecified asthma with (acute) exacerbation: Secondary | ICD-10-CM | POA: Diagnosis present

## 2021-02-21 LAB — COMPREHENSIVE METABOLIC PANEL
ALT: 24 U/L (ref 0–44)
AST: 23 U/L (ref 15–41)
Albumin: 3.5 g/dL (ref 3.5–5.0)
Alkaline Phosphatase: 54 U/L (ref 38–126)
Anion gap: 11 (ref 5–15)
BUN: 17 mg/dL (ref 8–23)
CO2: 25 mmol/L (ref 22–32)
Calcium: 9.1 mg/dL (ref 8.9–10.3)
Chloride: 103 mmol/L (ref 98–111)
Creatinine, Ser: 0.96 mg/dL (ref 0.44–1.00)
GFR, Estimated: >60 mL/min (ref >60)
Glucose, Bld: 170 mg/dL — ABNORMAL HIGH (ref 70–99)
Potassium: 4 mmol/L (ref 3.5–5.1)
Sodium: 139 mmol/L (ref 135–145)
Total Bilirubin: 0.5 mg/dL (ref 0.3–1.2)
Total Protein: 6.4 g/dL — ABNORMAL LOW (ref 6.5–8.1)

## 2021-02-21 LAB — HEMOGLOBIN A1C
Hgb A1c MFr Bld: 5.8 % — ABNORMAL HIGH (ref 4.8–5.6)
Mean Plasma Glucose: 119.76 mg/dL

## 2021-02-21 LAB — GLUCOSE, CAPILLARY
Glucose-Capillary: 141 mg/dL — ABNORMAL HIGH (ref 70–99)
Glucose-Capillary: 124 mg/dL — ABNORMAL HIGH (ref 70–99)
Glucose-Capillary: 103 mg/dL — ABNORMAL HIGH (ref 70–99)
Glucose-Capillary: 110 mg/dL — ABNORMAL HIGH (ref 70–99)

## 2021-02-21 LAB — CBC WITH DIFFERENTIAL/PLATELET
Abs Immature Granulocytes: 0.06 10*3/uL (ref 0.00–0.07)
Basophils Absolute: 0 10*3/uL (ref 0.0–0.1)
Basophils Relative: 0 %
Eosinophils Absolute: 0 10*3/uL (ref 0.0–0.5)
Eosinophils Relative: 0 %
HCT: 37.3 % (ref 36.0–46.0)
Hemoglobin: 11.9 g/dL — ABNORMAL LOW (ref 12.0–15.0)
Immature Granulocytes: 1 %
Lymphocytes Relative: 9 %
Lymphs Abs: 0.8 10*3/uL (ref 0.7–4.0)
MCH: 27.4 pg (ref 26.0–34.0)
MCHC: 31.9 g/dL (ref 30.0–36.0)
MCV: 85.7 fL (ref 80.0–100.0)
Monocytes Absolute: 0.2 10*3/uL (ref 0.1–1.0)
Monocytes Relative: 3 %
Neutro Abs: 6.9 10*3/uL (ref 1.7–7.7)
Neutrophils Relative %: 87 %
Platelets: 518 10*3/uL — ABNORMAL HIGH (ref 150–400)
RBC: 4.35 MIL/uL (ref 3.87–5.11)
RDW: 16.5 % — ABNORMAL HIGH (ref 11.5–15.5)
WBC: 7.9 10*3/uL (ref 4.0–10.5)
nRBC: 0 % (ref 0.0–0.2)

## 2021-02-21 LAB — MAGNESIUM: Magnesium: 2.3 mg/dL (ref 1.7–2.4)

## 2021-02-21 LAB — CBG MONITORING, ED: Glucose-Capillary: 190 mg/dL — ABNORMAL HIGH (ref 70–99)

## 2021-02-21 LAB — PHOSPHORUS: Phosphorus: 2.1 mg/dL — ABNORMAL LOW (ref 2.5–4.6)

## 2021-02-21 MED ORDER — ONDANSETRON HCL 4 MG PO TABS: 4.0000 mg | ORAL_TABLET | Freq: Four times a day (QID) | ORAL | Status: DC | PRN

## 2021-02-21 MED ORDER — BENZONATATE 100 MG PO CAPS: 100.0000 mg | ORAL_CAPSULE | Freq: Three times a day (TID) | ORAL | Status: DC | PRN

## 2021-02-21 MED ORDER — GUAIFENESIN ER 600 MG PO TB12
600.0000 mg | ORAL_TABLET | Freq: Two times a day (BID) | ORAL | Status: DC
  Administered 2021-02-21 – 2021-02-24 (×7): 600 mg via ORAL
  Filled 2021-02-21 (×7): qty 1

## 2021-02-21 MED ORDER — GABAPENTIN 100 MG PO CAPS
100.0000 mg | ORAL_CAPSULE | Freq: Every day | ORAL | Status: DC
  Administered 2021-02-21 – 2021-02-23 (×3): 100 mg via ORAL
  Filled 2021-02-21 (×3): qty 1

## 2021-02-21 MED ORDER — HYDROCOD POLST-CPM POLST ER 10-8 MG/5ML PO SUER: 5.0000 mL | Freq: Two times a day (BID) | ORAL | Status: DC | PRN

## 2021-02-21 MED ORDER — ALBUTEROL SULFATE (2.5 MG/3ML) 0.083% IN NEBU: 2.5000 mg | INHALATION_SOLUTION | RESPIRATORY_TRACT | Status: DC | PRN

## 2021-02-21 MED ORDER — BENZONATATE 100 MG PO CAPS
100.0000 mg | ORAL_CAPSULE | Freq: Two times a day (BID) | ORAL | Status: DC
  Administered 2021-02-21: 100 mg via ORAL
  Filled 2021-02-21: qty 1

## 2021-02-21 MED ORDER — ROSUVASTATIN CALCIUM 10 MG PO TABS
10.0000 mg | ORAL_TABLET | Freq: Every day | ORAL | Status: DC
  Administered 2021-02-21 – 2021-02-23 (×3): 10 mg via ORAL
  Filled 2021-02-21 (×3): qty 1

## 2021-02-21 MED ORDER — ENOXAPARIN SODIUM 40 MG/0.4ML ~~LOC~~ SOLN
40.0000 mg | SUBCUTANEOUS | Status: DC
  Administered 2021-02-21 – 2021-02-24 (×4): 40 mg via SUBCUTANEOUS
  Filled 2021-02-21 (×4): qty 0.4

## 2021-02-21 MED ORDER — INSULIN ASPART 100 UNIT/ML ~~LOC~~ SOLN
0.0000 [IU] | Freq: Every day | SUBCUTANEOUS | Status: DC
  Filled 2021-02-21: qty 0.05

## 2021-02-21 MED ORDER — METHYLPREDNISOLONE SODIUM SUCC 40 MG IJ SOLR
40.0000 mg | Freq: Two times a day (BID) | INTRAMUSCULAR | Status: DC
  Administered 2021-02-21: 40 mg via INTRAVENOUS
  Filled 2021-02-21: qty 1

## 2021-02-21 MED ORDER — INSULIN ASPART 100 UNIT/ML ~~LOC~~ SOLN
0.0000 [IU] | Freq: Three times a day (TID) | SUBCUTANEOUS | Status: DC
  Administered 2021-02-21 (×2): 2 [IU] via SUBCUTANEOUS
  Administered 2021-02-22: 3 [IU] via SUBCUTANEOUS
  Administered 2021-02-22 (×2): 2 [IU] via SUBCUTANEOUS
  Administered 2021-02-23: 3 [IU] via SUBCUTANEOUS
  Administered 2021-02-23: 2 [IU] via SUBCUTANEOUS
  Filled 2021-02-21: qty 0.15

## 2021-02-21 MED ORDER — ACETAMINOPHEN 325 MG PO TABS: 650.0000 mg | ORAL_TABLET | Freq: Four times a day (QID) | ORAL | Status: DC | PRN

## 2021-02-21 MED ORDER — FLUTICASONE PROPIONATE 50 MCG/ACT NA SUSP
1.0000 | Freq: Two times a day (BID) | NASAL | Status: DC
  Administered 2021-02-21 – 2021-02-24 (×7): 1 via NASAL
  Filled 2021-02-21: qty 16

## 2021-02-21 MED ORDER — ASPIRIN EC 81 MG PO TBEC
81.0000 mg | DELAYED_RELEASE_TABLET | Freq: Every day | ORAL | Status: DC
  Administered 2021-02-21 – 2021-02-24 (×4): 81 mg via ORAL
  Filled 2021-02-21 (×4): qty 1

## 2021-02-21 MED ORDER — TRAZODONE HCL 50 MG PO TABS
50.0000 mg | ORAL_TABLET | Freq: Once | ORAL | Status: AC
  Administered 2021-02-21: 50 mg via ORAL
  Filled 2021-02-21: qty 1

## 2021-02-21 MED ORDER — ONDANSETRON HCL 4 MG/2ML IJ SOLN: 4.0000 mg | Freq: Four times a day (QID) | INTRAMUSCULAR | Status: DC | PRN

## 2021-02-21 MED ORDER — MONTELUKAST SODIUM 10 MG PO TABS
10.0000 mg | ORAL_TABLET | Freq: Every day | ORAL | Status: DC
  Administered 2021-02-21 – 2021-02-23 (×3): 10 mg via ORAL
  Filled 2021-02-21 (×3): qty 1

## 2021-02-21 MED ORDER — PREDNISOLONE ACETATE 1 % OP SUSP
1.0000 [drp] | Freq: Two times a day (BID) | OPHTHALMIC | Status: DC
  Administered 2021-02-21 – 2021-02-24 (×7): 1 [drp] via OPHTHALMIC
  Filled 2021-02-21: qty 5

## 2021-02-21 MED ORDER — IPRATROPIUM-ALBUTEROL 0.5-2.5 (3) MG/3ML IN SOLN
3.0000 mL | Freq: Four times a day (QID) | RESPIRATORY_TRACT | Status: DC
  Administered 2021-02-21 – 2021-02-22 (×8): 3 mL via RESPIRATORY_TRACT
  Filled 2021-02-21 (×8): qty 3

## 2021-02-21 MED ORDER — ACETAMINOPHEN 650 MG RE SUPP: 650.0000 mg | Freq: Four times a day (QID) | RECTAL | Status: DC | PRN

## 2021-02-21 MED ORDER — OFLOXACIN 0.3 % OP SOLN
1.0000 [drp] | Freq: Two times a day (BID) | OPHTHALMIC | Status: DC
  Administered 2021-02-21 – 2021-02-24 (×7): 1 [drp] via OPHTHALMIC
  Filled 2021-02-21: qty 5

## 2021-02-21 MED ORDER — HYDROXYUREA 500 MG PO CAPS
500.0000 mg | ORAL_CAPSULE | Freq: Three times a day (TID) | ORAL | Status: DC
  Administered 2021-02-21 – 2021-02-24 (×10): 500 mg via ORAL
  Filled 2021-02-21 (×10): qty 1

## 2021-02-21 MED ORDER — VITAMIN D 25 MCG (1000 UNIT) PO TABS
1000.0000 [IU] | ORAL_TABLET | Freq: Every day | ORAL | Status: DC
  Administered 2021-02-21 – 2021-02-24 (×4): 1000 [IU] via ORAL
  Filled 2021-02-21 (×4): qty 1

## 2021-02-21 MED ORDER — FOLIC ACID 1 MG PO TABS
1.0000 mg | ORAL_TABLET | Freq: Every day | ORAL | Status: DC
  Administered 2021-02-21 – 2021-02-24 (×4): 1 mg via ORAL
  Filled 2021-02-21 (×4): qty 1

## 2021-02-21 MED ORDER — PANTOPRAZOLE SODIUM 40 MG PO TBEC
40.0000 mg | DELAYED_RELEASE_TABLET | Freq: Every day | ORAL | Status: DC
  Administered 2021-02-21 – 2021-02-24 (×4): 40 mg via ORAL
  Filled 2021-02-21 (×4): qty 1

## 2021-02-21 MED ORDER — FLUTICASONE FUROATE-VILANTEROL 200-25 MCG/INH IN AEPB
1.0000 | INHALATION_SPRAY | Freq: Every day | RESPIRATORY_TRACT | Status: DC
  Administered 2021-02-21 – 2021-02-24 (×4): 1 via RESPIRATORY_TRACT
  Filled 2021-02-21: qty 28

## 2021-02-21 MED ORDER — METHYLPREDNISOLONE SODIUM SUCC 125 MG IJ SOLR
60.0000 mg | Freq: Two times a day (BID) | INTRAMUSCULAR | Status: DC
  Administered 2021-02-21 – 2021-02-23 (×4): 60 mg via INTRAVENOUS
  Filled 2021-02-21 (×4): qty 2

## 2021-02-21 NOTE — Progress Notes (Signed)
Secure chat with Arlana Pouch  RN/ED. No reply. Pt received to room 1621 via stretcher and ambulated to bed. Education initiated on use of call bell for needs/safety and pt handbook given to pt.

## 2021-02-21 NOTE — Progress Notes (Signed)
PROGRESS NOTE  [name] ALOI CVE:[id number] DOB: 08/18/1946 DOA: 02/20/2021 PCP: Merrilee Seashore, MD  HPI/Recap of past 24 hours: HPI from Dr Edison Simon is a 75 y.o. female, with PMH of RA, moderate persistent asthma, chronic cough, GERD, vocal cord dysfunction, allergic rhinitis who presented to the ER on 02/20/2021 with dyspnea and findings concerning with asthma exacerbation. Patient states over the past 2 days she has had worsening, progressive dyspnea, wheezing, and cough. She has been compliant with her home inhaler regiment, Breztri BID, and nebulizer as needed.  She has tried her nebulizer without improvement.  She is currently out of her rescue inhaler, but does not routinely use this.  Denies any sick contacts or fever.  She says her last asthma exacerbation was over a year ago.  Has not been on steroids since then.  She has never been intubated for her asthma.  Does not use oxygen at home.  Non-smoker.  No recent changes in medication. In the ED, vital signs fairly stable, Covid negative, chest x-ray with no acute changes, labs fairly stable.  Patient with significant wheezing, was given Solu-Medrol, magnesium, albuterol in the ED without any improvement.  Patient admitted for further management.     Today, patient still reports feeling about the same, still with coughing, significant bilateral wheezing, still short of breath, denies any chest pain, nausea/vomiting, abdominal pain, diarrhea.    Assessment/Plan: Active Problems:   Vocal cord dysfunction   GERD (gastroesophageal reflux disease)   Essential thrombocythemia (HCC)   Acute respiratory distress   Asthma exacerbation   Hyperglycemia   Acute asthma exacerbation History of moderate persistent asthma/vocal cord dysfunction/allergic rhinitis Currently on room air, but noted significant bilateral wheezing Chest x-ray unremarkable Continue Solu-Medrol, duo nebs, inhalers, cough suppressants,  singular Monitor closely  History of prediabetes History of hyperglycemia associated with steroid use SSI, Accu-Cheks, hypoglycemic protocol  Essential thrombocythemia, sickle cell trait Continue folic acid, aspirin, hydroxyurea  GERD Continue PPI  Hyperlipidemia Continue statins  Obesity Lifestyle modification advised    Estimated body mass index is 30.58 kg/m as calculated from the following:   Height as of this encounter: 5\' 2"  (1.575 m).   Weight as of this encounter: 75.8 kg.     Code Status: Full  Family Communication: Discussed with patient extensively  Disposition Plan: Status is: Observation  The patient will require care spanning > 2 midnights and should be moved to inpatient because: Inpatient level of care appropriate due to severity of illness  Dispo: The patient is from: Home              Anticipated d/c is to: Home              Patient currently is not medically stable to d/c.   Difficult to place patient No    Consultants:  None  Procedures:  None  Antimicrobials:  None  DVT prophylaxis: Lovenox   Objective: Vitals:   02/21/21 0440 02/21/21 0831 02/21/21 0847 02/21/21 1241  BP:  121/67  (!) 142/79  Pulse:  91  92  Resp:  18  18  Temp:  97.9 F (36.6 C)  98.4 F (36.9 C)  TempSrc:  Oral  Oral  SpO2:  100% 99% 100%  Weight: 75.8 kg     Height: 5\' 2"  (1.575 m)       Intake/Output Summary (Last 24 hours) at 02/21/2021 1322 Last data filed at 02/21/2021 1030 Gross per 24 hour  Intake 340 ml  Output --  Net 340 ml   Filed Weights   02/21/21 0440  Weight: 75.8 kg    Exam:  General:  In mild distress  Cardiovascular: S1, S2 present  Respiratory:  Bilateral wheezing noted  Abdomen: Soft, nontender, nondistended, bowel sounds present  Musculoskeletal: No bilateral pedal edema noted  Skin: Normal  Psychiatry: Normal mood   Data Reviewed: CBC: Recent Labs  Lab 02/20/21 2045 02/21/21 0512  WBC 9.3 7.9   NEUTROABS  --  6.9  HGB 12.1 11.9*  HCT 38.1 37.3  MCV 86.4 85.7  PLT 556* 756*   Basic Metabolic Panel: Recent Labs  Lab 02/20/21 2045 02/21/21 0512  NA 137 139  K 4.0 4.0  CL 104 103  CO2 22 25  GLUCOSE 200* 170*  BUN 15 17  CREATININE 1.03* 0.96  CALCIUM 9.3 9.1  MG 2.3  --   PHOS 2.1*  --    GFR: Estimated Creatinine Clearance: 48.3 mL/min (by C-G formula based on SCr of 0.96 mg/dL). Liver Function Tests: Recent Labs  Lab 02/21/21 0512  AST 23  ALT 24  ALKPHOS 54  BILITOT 0.5  PROT 6.4*  ALBUMIN 3.5   No results for input(s): LIPASE, AMYLASE in the last 168 hours. No results for input(s): AMMONIA in the last 168 hours. Coagulation Profile: No results for input(s): INR, PROTIME in the last 168 hours. Cardiac Enzymes: No results for input(s): CKTOTAL, CKMB, CKMBINDEX, TROPONINI in the last 168 hours. BNP (last 3 results) Recent Labs    06/16/20 0901  PROBNP 102   HbA1C: Recent Labs    02/20/21 2045  HGBA1C 5.8*   CBG: Recent Labs  Lab 02/21/21 0145 02/21/21 0750 02/21/21 1219  GLUCAP 190* 141* 124*   Lipid Profile: No results for input(s): CHOL, HDL, LDLCALC, TRIG, CHOLHDL, LDLDIRECT in the last 72 hours. Thyroid Function Tests: No results for input(s): TSH, T4TOTAL, FREET4, T3FREE, THYROIDAB in the last 72 hours. Anemia Panel: No results for input(s): VITAMINB12, FOLATE, FERRITIN, TIBC, IRON, RETICCTPCT in the last 72 hours. Urine analysis:    Component Value Date/Time   COLORURINE YELLOW 09/01/2012 2014   APPEARANCEUR CLOUDY (A) 09/01/2012 2014   LABSPEC 1.021 09/01/2012 2014   PHURINE 5.5 09/01/2012 2014   GLUCOSEU NEGATIVE 09/01/2012 2014   GLUCOSEU NEGATIVE 01/11/2011 0950   HGBUR NEGATIVE 09/01/2012 2014   BILIRUBINUR NEGATIVE 09/01/2012 2014   [place] NEGATIVE 09/01/2012 2014   PROTEINUR NEGATIVE 09/01/2012 2014   UROBILINOGEN 1.0 09/01/2012 2014   NITRITE NEGATIVE 09/01/2012 2014   LEUKOCYTESUR TRACE (A) 09/01/2012 2014    Sepsis Labs: @LABRCNTIP (procalcitonin:4,lacticidven:4)  ) Recent Results (from the past 240 hour(s))  Resp Panel by RT-PCR (Flu A&B, Covid) Nasopharyngeal Swab     Status: None   Collection Time: 02/20/21 10:19 PM   Specimen: Nasopharyngeal Swab; Nasopharyngeal(NP) swabs in vial transport medium  Result Value Ref Range Status   SARS Coronavirus 2 by RT PCR NEGATIVE NEGATIVE Final    Comment: (NOTE) SARS-CoV-2 target nucleic acids are NOT DETECTED.  The SARS-CoV-2 RNA is generally detectable in upper respiratory specimens during the acute phase of infection. The lowest concentration of SARS-CoV-2 viral copies this assay can detect is 138 copies/mL. A negative result does not preclude SARS-Cov-2 infection and should not be used as the sole basis for treatment or other patient management decisions. A negative result may occur with  improper specimen collection/handling, submission of specimen other than nasopharyngeal swab, presence of viral mutation(s) within the areas targeted by this assay,  and inadequate number of viral copies(<138 copies/mL). A negative result must be combined with clinical observations, patient history, and epidemiological information. The expected result is Negative.  Fact Sheet for Patients:  EntrepreneurPulse.com.au  Fact Sheet for Healthcare Providers:  IncredibleEmployment.be  This test is no t yet approved or cleared by the [place] FDA and  has been authorized for detection and/or diagnosis of SARS-CoV-2 by FDA under an Emergency Use Authorization (EUA). This EUA will remain  in effect (meaning this test can be used) for the duration of the COVID-19 declaration under Section 564(b)(1) of the Act, 21 U.S.C.section 360bbb-3(b)(1), unless the authorization is terminated  or revoked sooner.       Influenza A by PCR NEGATIVE NEGATIVE Final   Influenza B by PCR NEGATIVE NEGATIVE Final    Comment: (NOTE) The  Xpert Xpress SARS-CoV-2/FLU/RSV plus assay is intended as an aid in the diagnosis of influenza from Nasopharyngeal swab specimens and should not be used as a sole basis for treatment. Nasal washings and aspirates are unacceptable for Xpert Xpress SARS-CoV-2/FLU/RSV testing.  Fact Sheet for Patients: EntrepreneurPulse.com.au  Fact Sheet for Healthcare Providers: IncredibleEmployment.be  This test is not yet approved or cleared by the [place] FDA and has been authorized for detection and/or diagnosis of SARS-CoV-2 by FDA under an Emergency Use Authorization (EUA). This EUA will remain in effect (meaning this test can be used) for the duration of the COVID-19 declaration under Section 564(b)(1) of the Act, 21 U.S.C. section 360bbb-3(b)(1), unless the authorization is terminated or revoked.  Performed at [facility], [place] [address] [place], [place] [address]       Studies: DG Chest Port 1 View  Result Date: 02/20/2021 CLINICAL DATA:  Shortness of breath EXAM: PORTABLE CHEST 1 VIEW COMPARISON:  07/06/2020 FINDINGS: Heart and mediastinal contours are within normal limits. No focal opacities or effusions. No acute bony abnormality. Small hiatal hernia. IMPRESSION: No active disease. Electronically Signed   By: Rolm Baptise M.D.   On: 02/20/2021 20:29    Scheduled Meds: . aspirin EC  81 mg Oral Daily  . benzonatate  100 mg Oral BID  . cholecalciferol  1,000 Units Oral Daily  . enoxaparin (LOVENOX) injection  40 mg Subcutaneous Q24H  . fluticasone  1 spray Each Nare BID  . fluticasone furoate-vilanterol  1 puff Inhalation Daily  . folic acid  1 mg Oral Daily  . gabapentin  100 mg Oral QHS  . hydroxyurea  500 mg Oral TID AC  . insulin aspart  0-15 Units Subcutaneous TID WC  . insulin aspart  0-5 Units Subcutaneous QHS  . ipratropium-albuterol  3 mL Nebulization Q6H  . methylPREDNISolone (SOLU-MEDROL) injection  40 mg  Intravenous Q12H  . montelukast  10 mg Oral QHS  . ofloxacin  1 drop Right Eye BID  . pantoprazole  40 mg Oral Daily  . prednisoLONE acetate  1 drop Right Eye BID  . rosuvastatin  10 mg Oral QHS    Continuous Infusions: . sodium chloride 10 mL/hr at 02/20/21 2325     LOS: 0 days     Alma Friendly, MD [facility]  If 7PM-7AM, please contact night-coverage [email or web address] 02/21/2021, 1:22 PM

## 2021-02-21 NOTE — H&P (Signed)
History and Physical  Patient Name: [name]     JOI:[id number]    DOB: 29-Aug-1946    DOA: 02/20/2021 PCP: Merrilee Seashore, MD  Patient coming from: Home  Chief Complaint: Dyspnea, wheezing    HPI: [name] is a 75 y.o. female, with PMH of RA, moderate persistent asthma, chronic cough, GERD, vocal cord dysfunction, allergic rhinitis who presented to the ER on 02/20/2021 with dyspnea and findings concerning with asthma exacerbation.  Patient states over the past 2 days she has had worsening, progressive dyspnea, wheezing, and cough.  She has been compliant with her home inhaler regiment, Breztri BID, and nebulizer as needed.  She has tried her nebulizer without improvement.  She is currently out of her rescue inhaler, but does not routinely use this.  Denies any sick contacts or fever.  She says her last asthma exacerbation was over a year ago.  Has not been on steroids since then.  She has never been intubated for her asthma.  Does not use oxygen at home.  Non-smoker.  No recent changes in medication.   ED course: -Vitals on admission: Afebrile, heart rate 109, respiratory rate 20, blood pressure 141/75, maintaining sats on room air -Labs on initial presentation: Sodium 137, potassium 4, chloride 104, bicarb 22, glucose 200, BUN 15, creatinine 1.03, WBC 9.3, hemoglobin 12.1, Covid negative -Imaging obtained on admission: Chest x-ray showed no acute processes -In the ED the patient was given albuterol, Atrovent, magnesium, Solu-Medrol with some improvement but still having significant wheezing, thus the hospitalist service was contacted for further evaluation and management.     ROS: A complete and thorough 12 point review of systems obtained, negative listed in HPI.     Past Medical History:  Diagnosis Date   Allergic rhinitis    Anemia    Asthma    Chronic bronchitis (HCC)    Colonic polyp    DDD (degenerative disc disease), lumbar 07/20/2011   DJD  (degenerative joint disease) of knee    bilateral   DJD (degenerative joint disease), lumbar 07/20/2011   Esophageal stricture    GERD (gastroesophageal reflux disease)    Hiatal hernia    Hyperlipidemia    Osteoporosis    Stroke [facility])    "didn't know I'd had one before I was told; didn't do any damage" (11/24/2016)   Vocal cord dysfunction     Past Surgical History:  Procedure Laterality Date   CARPAL TUNNEL RELEASE Bilateral    COLONOSCOPY W/ BIOPSIES AND POLYPECTOMY     ESOPHAGOGASTRODUODENOSCOPY (EGD) WITH ESOPHAGEAL DILATION     FOOT SURGERY Bilateral    "might have been bunions or hammertoes"   KNEE ARTHROSCOPY Left 11/06/2013   TOTAL ABDOMINAL HYSTERECTOMY     VIDEO BRONCHOSCOPY Bilateral 12/12/2014   Procedure: VIDEO BRONCHOSCOPY WITHOUT FLUORO;  Surgeon: Chesley Mires, MD;  Location: [place];  Service: Cardiopulmonary;  Laterality: Bilateral;    Social History: Patient lives at home.  The patient walks without assistance.  Non smoker.  Allergies  Allergen Reactions   Penicillins Itching and Rash    Did it involve swelling of the face/tongue/throat, SOB, or low BP? N Did it involve sudden or severe rash/hives, skin peeling, or any reaction on the inside of your mouth or nose? Y Did you need to seek medical attention at a hospital or doctor's office? N When did it last happen?Decades Ago If all above answers are "NO", may proceed with cephalosporin use.      Family history:  family history includes Asthma in her brother and another family member; Cancer in her sister; Colon cancer (age of onset: 30) in her father; Heart attack in her mother.  Prior to Admission medications   Medication Sig Start Date End Date Taking? Authorizing Provider  albuterol (PROVENTIL HFA) 108 (90 Base) MCG/ACT inhaler Inhale 2 puffs into the lungs every 4 (four) hours as needed for wheezing or shortness of breath. Reported on 12/30/2015 05/25/20   Collene Gobble, MD   albuterol (PROVENTIL) (2.5 MG/3ML) 0.083% nebulizer solution Take 3 mLs (2.5 mg total) by nebulization every 4 (four) hours as needed for shortness of breath. Reported on 12/30/2015 01/08/20   Collene Gobble, MD  aspirin EC 81 MG tablet Take 81 mg by mouth daily.    [provider]  Budeson-Glycopyrrol-Formoterol (BREZTRI AEROSPHERE) 160-9-4.8 MCG/ACT AERO Inhale 2 puffs into the lungs in the morning and at bedtime. 06/17/20   Collene Gobble, MD  cholecalciferol (VITAMIN D) 1000 UNITS tablet Take 1,000 Units by mouth daily.    [provider]  fluticasone (FLONASE) 50 MCG/ACT nasal spray Place 1 spray into the nose 2 (two) times daily.     [provider]  folic acid (FOLVITE) 1 MG tablet Take 1 tablet (1 mg total) by mouth daily. 06/03/15   [place], Holli Humbles, NP  gabapentin (NEURONTIN) 100 MG capsule SMARTSIG:2 Capsule(s) By Mouth Every Evening 08/27/20   [provider]  hydroxyurea (HYDREA) 500 MG capsule Take 1 capsule (500 mg total) by mouth 3 (three) times daily before meals. 03/02/18   [name], MD  Magnesium 200 MG TABS Take 1 tablet by mouth 2 (two) times daily.     [provider]  montelukast (SINGULAIR) 10 MG tablet TAKE 1 TABLET BY MOUTH EVERYDAY AT BEDTIME Patient taking differently: Take 10 mg by mouth at bedtime.  02/26/20   Collene Gobble, MD  pantoprazole (PROTONIX) 40 MG tablet Take 1 tablet (40 mg total) by mouth 2 (two) times daily. Take 30- 60 min before your first and last meals of the day 02/19/21   Collene Gobble, MD  pravastatin (PRAVACHOL) 40 MG tablet Take 40 mg by mouth daily.    [provider]  promethazine-dextromethorphan (PROMETHAZINE-DM) 6.25-15 MG/5ML syrup Take 5 mLs by mouth 4 (four) times daily as needed for cough. 07/08/20   Tanda Rockers, MD       Physical Exam: BP 137/78    Pulse 96    Temp 98.1 F (36.7 C) (Oral)    Resp 16    SpO2 96%   General appearance: Well-developed, adult female,  alert and in moderate respiratory distress  Eyes: Anicteric, conjunctiva pink, lids and lashes normal. PERRL.    ENT: No nasal deformity, discharge, epistaxis.  Hearing intact. OP moist without lesions.   Neck: No neck masses.  Trachea midline.  No thyromegaly/tenderness. Lymph: No cervical or supraclavicular lymphadenopathy. Skin: Warm and dry.  No jaundice.  No suspicious rashes or lesions. Cardiac: RRR, nl S1-S2, no murmurs appreciated.  No LE edema.  Radial and pedal pulses 2+ and symmetric. Respiratory:  Bilateral wheezing, coarse breath sounds, moderate distress with talking Abdomen: Abdomen soft.  No tenderness with palpation. No ascites, distension, hepatosplenomegaly.   MSK: No deformities or effusions of the large joints of the upper or lower extremities bilaterally.  No cyanosis or clubbing. Neuro: Cranial nerves 2 through 12 grossly intact.  Sensation intact to light touch. Speech is fluent.  Marland Kitchen  Psych: Sensorium intact and responding to questions, attention normal.  Behavior appropriate.  Judgment and insight appear normal.    Labs on Admission:  I have personally reviewed following labs and imaging studies: CBC: Recent Labs  Lab 02/20/21 2045  WBC 9.3  HGB 12.1  HCT 38.1  MCV 86.4  PLT 093*   Basic Metabolic Panel: Recent Labs  Lab 02/20/21 2045  NA 137  K 4.0  CL 104  CO2 22  GLUCOSE 200*  BUN 15  CREATININE 1.03*  CALCIUM 9.3   GFR: CrCl cannot be calculated (Unknown ideal weight.).  Liver Function Tests: No results for input(s): AST, ALT, ALKPHOS, BILITOT, PROT, ALBUMIN in the last 168 hours. No results for input(s): LIPASE, AMYLASE in the last 168 hours. No results for input(s): AMMONIA in the last 168 hours. Coagulation Profile: No results for input(s): INR, PROTIME in the last 168 hours. Cardiac Enzymes: No results for input(s): CKTOTAL, CKMB, CKMBINDEX, TROPONINI in the last 168 hours. BNP (last 3 results) Recent Labs    06/16/20 0901   PROBNP 102   HbA1C: No results for input(s): HGBA1C in the last 72 hours. CBG: No results for input(s): GLUCAP in the last 168 hours. Lipid Profile: No results for input(s): CHOL, HDL, LDLCALC, TRIG, CHOLHDL, LDLDIRECT in the last 72 hours. Thyroid Function Tests: No results for input(s): TSH, T4TOTAL, FREET4, T3FREE, THYROIDAB in the last 72 hours. Anemia Panel: No results for input(s): VITAMINB12, FOLATE, FERRITIN, TIBC, IRON, RETICCTPCT in the last 72 hours.   Recent Results (from the past 240 hour(s))  Resp Panel by RT-PCR (Flu A&B, Covid) Nasopharyngeal Swab     Status: None   Collection Time: 02/20/21 10:19 PM   Specimen: Nasopharyngeal Swab; Nasopharyngeal(NP) swabs in vial transport medium  Result Value Ref Range Status   SARS Coronavirus 2 by RT PCR NEGATIVE NEGATIVE Final    Comment: (NOTE) SARS-CoV-2 target nucleic acids are NOT DETECTED.  The SARS-CoV-2 RNA is generally detectable in upper respiratory specimens during the acute phase of infection. The lowest concentration of SARS-CoV-2 viral copies this assay can detect is 138 copies/mL. A negative result does not preclude SARS-Cov-2 infection and should not be used as the sole basis for treatment or other patient management decisions. A negative result may occur with  improper specimen collection/handling, submission of specimen other than nasopharyngeal swab, presence of viral mutation(s) within the areas targeted by this assay, and inadequate number of viral copies(<138 copies/mL). A negative result must be combined with clinical observations, patient history, and epidemiological information. The expected result is Negative.  Fact Sheet for Patients:  EntrepreneurPulse.com.au  Fact Sheet for Healthcare Providers:  IncredibleEmployment.be  This test is no t yet approved or cleared by the [place] FDA and  has been authorized for detection and/or diagnosis of SARS-CoV-2  by FDA under an Emergency Use Authorization (EUA). This EUA will remain  in effect (meaning this test can be used) for the duration of the COVID-19 declaration under Section 564(b)(1) of the Act, 21 U.S.C.section 360bbb-3(b)(1), unless the authorization is terminated  or revoked sooner.       Influenza A by PCR NEGATIVE NEGATIVE Final   Influenza B by PCR NEGATIVE NEGATIVE Final    Comment: (NOTE) The Xpert Xpress SARS-CoV-2/FLU/RSV plus assay is intended as an aid in the diagnosis of influenza from Nasopharyngeal swab specimens and should not be used as a sole basis for treatment. Nasal washings and aspirates are unacceptable for Xpert Xpress SARS-CoV-2/FLU/RSV testing.  Fact Sheet  for Patients: EntrepreneurPulse.com.au  Fact Sheet for Healthcare Providers: IncredibleEmployment.be  This test is not yet approved or cleared by the [place] FDA and has been authorized for detection and/or diagnosis of SARS-CoV-2 by FDA under an Emergency Use Authorization (EUA). This EUA will remain in effect (meaning this test can be used) for the duration of the COVID-19 declaration under Section 564(b)(1) of the Act, 21 U.S.C. section 360bbb-3(b)(1), unless the authorization is terminated or revoked.  Performed at [facility], [place] [address] [place], [place] [address]            Radiological Exams on Admission: Personally reviewed imaging which shows: Chest x-ray shows no acute processes DG Chest Port 1 View  Result Date: 02/20/2021 CLINICAL DATA:  Shortness of breath EXAM: PORTABLE CHEST 1 VIEW COMPARISON:  07/06/2020 FINDINGS: Heart and mediastinal contours are within normal limits. No focal opacities or effusions. No acute bony abnormality. Small hiatal hernia. IMPRESSION: No active disease. Electronically Signed   By: Rolm Baptise M.D.   On: 02/20/2021 20:29       Assessment/Plan   1.  Acute asthma  exacerbation -History of moderate persistent asthma, upper airway irritation syndrome -Chest x-ray on admission showed no acute processes -Currently maintaining sats on room air however still significantly symptomatic and having significant wheezing despite steroids and breathing treatments given in the ED -Continue Solu-Medrol 40mg  IV Q12H -Scheduled duo nebs and PRN albuterol -Started on Breo daily as we do not have her regular long-acting inhaler, breztri -Antitussives scheduled and as needed  2.  GERD -Continue home PPI  3.  Essential thrombocythemia, sickle cell trait -Continue home folic acid, aspirin, hydroxyurea  4.  Hyperglycemia -On admission glucose 200 -Appears to have history of prediabetes -Hemoglobin A1c ordered -Started on sliding scale as suspect glucose will worsen given she is on steroids  5.  Acute respiratory distress -Secondary to asthma exacerbation -See further plans above     DVT prophylaxis: Lovenox Code Status: Full Family Communication: Patient herself Disposition Plan: Anticipate discharge home when medically optimized Consults called: None Admission status: Observation   At the point of initial evaluation, it is my clinical opinion that admission for OBSERVATION is reasonable and necessary because the patient's presenting complaints in the context of their chronic conditions represent sufficient risk of deterioration or significant morbidity to constitute reasonable grounds for close observation in the hospital setting, but that the patient may be medically stable for discharge from the hospital within 24 to 48 hours.    Medical decision making: Patient seen at 12:34 AM on 02/21/2021.  The patient was discussed with ER provider.  What exists of the patient's chart was reviewed in depth and summarized above.  Clinical condition: Fair.        Doran Heater [facility] Please page though [place] or Epic secure chat:  For password,  contact charge nurse

## 2021-02-21 NOTE — Plan of Care (Signed)
POC initiated 

## 2021-02-21 NOTE — ED Provider Notes (Signed)
  Physical Exam  BP 137/78   Pulse 96   Temp 98.1 F (36.7 C) (Oral)   Resp 16   SpO2 96%   Physical Exam  ED Course/Procedures   Clinical Course as of 02/21/21 0027  Sat Feb 20, 2021  2210 Still wheezing on exam.   Persistent increased work of breathing and will order an additional breathing treatment and magnesium [JK]    Clinical Course User Index [JK] Dorie Rank, MD    Procedures  MDM  Care assumed at 11 pm.  Patient has a history of asthma and is here with wheezing.  She had noted to have increased work of breathing and was given albuterol and steroids.  Upon reassessment, patient still having wheezing so magnesium and additional breathing treatment were ordered.  Covid test is pending  12:29 AM Patient's Covid test is negative.  Her VBG is reassuring.  However after additional nebs and magnesium, patient is still having significant wheezing and increased work of breathing.  Will admit for asthma exacerbation.      Drenda Freeze, MD 02/21/21 [phone]

## 2021-02-21 NOTE — ED Notes (Signed)
ED TO INPATIENT HANDOFF REPORT  Name/Age/Gender [name] 75 y.o. female  Code Status    Code Status Orders  (From admission, onward)         Start     Ordered   02/21/21 0104  Full code  Continuous        02/21/21 0108        Code Status History    Date Active Date Inactive Code Status Order ID Comments User Context   11/24/2016 1500 11/25/2016 1724 Full Code [id number]  Waldemar Dickens, MD Inpatient   12/06/2014 1335 12/13/2014 1710 Full Code [id number]  Caren Griffins, MD ED   09/02/2012 0125 09/02/2012 1911 Full Code [id number]  Pollie Friar, RN Inpatient   05/28/2012 1628 05/29/2012 1539 Full Code [id number]  Teofilo Pod, PA-C ED   05/28/2012 1618 05/28/2012 1628 Full Code [id number]  [facility]   Advance Care Planning Activity      Home/SNF/Other Home  Chief Complaint Asthma exacerbation [J45.901]  Level of Care/Admitting Diagnosis ED Disposition    ED Disposition Condition Comment   Admit  Hospital Area: [facility] [[id number]]  Level of Care: Med-Surg [16]  Covid Evaluation: Confirmed COVID Negative  Diagnosis: Asthma exacerbation [924268]  Admitting Physician: Doran Heater [[id number]]  Attending Physician: Doran Heater [[id number]]       Medical History Past Medical History:  Diagnosis Date  . Allergic rhinitis   . Anemia   . Asthma   . Chronic bronchitis ([place])   . Colonic polyp   . DDD (degenerative disc disease), lumbar 07/20/2011  . DJD (degenerative joint disease) of knee    bilateral  . DJD (degenerative joint disease), lumbar 07/20/2011  . Esophageal stricture   . GERD (gastroesophageal reflux disease)   . Hiatal hernia   . Hyperlipidemia   . Osteoporosis   . Stroke [facility])    "didn't know I'd had one before I was told; didn't do any damage" (11/24/2016)  . Vocal cord dysfunction     Allergies Allergies  Allergen Reactions  . Pravachol [Pravastatin] Other (See Comments)  . Penicillins  Itching and Rash    Did it involve swelling of the face/tongue/throat, SOB, or low BP? N Did it involve sudden or severe rash/hives, skin peeling, or any reaction on the inside of your mouth or nose? Y Did you need to seek medical attention at a hospital or doctor's office? N When did it last happen?Decades Ago If all above answers are "NO", may proceed with cephalosporin use.      IV Location/Drains/Wounds Patient Lines/Drains/Airways Status    Active Line/Drains/Airways    Name Placement date Placement time Site Days   Peripheral IV 02/20/21 Right Hand 02/20/21  2047  Hand  1          Labs/Imaging Results for orders placed or performed during the hospital encounter of 02/20/21 (from the past 48 hour(s))  Basic metabolic panel     Status: Abnormal   Collection Time: 02/20/21  8:45 PM  Result Value Ref Range   Sodium 137 135 - 145 mmol/L   Potassium 4.0 3.5 - 5.1 mmol/L   Chloride 104 98 - 111 mmol/L   CO2 22 22 - 32 mmol/L   Glucose, Bld 200 (H) 70 - 99 mg/dL    Comment: Glucose reference range applies only to samples taken after fasting for at least 8 hours.   BUN 15 8 - 23 mg/dL   Creatinine,  Ser 1.03 (H) 0.44 - 1.00 mg/dL   Calcium 9.3 8.9 - 10.3 mg/dL   GFR, Estimated 57 (L) >60 mL/min    Comment: (NOTE) Calculated using the CKD-EPI Creatinine Equation (2021)    Anion gap 11 5 - 15    Comment: Performed at [facility], [place] [address] [place], [place] [address]  CBC     Status: Abnormal   Collection Time: 02/20/21  8:45 PM  Result Value Ref Range   WBC 9.3 4.0 - 10.5 K/uL   RBC 4.41 3.87 - 5.11 MIL/uL   Hemoglobin 12.1 12.0 - 15.0 g/dL   HCT 38.1 36.0 - 46.0 %   MCV 86.4 80.0 - 100.0 fL   MCH 27.4 26.0 - 34.0 pg   MCHC 31.8 30.0 - 36.0 g/dL   RDW 16.6 (H) 11.5 - 15.5 %   Platelets 556 (H) 150 - 400 K/uL   nRBC 0.0 0.0 - 0.2 %    Comment: Performed at [facility], [place] [address] [place], [place] [address]   Magnesium     Status: None   Collection Time: 02/20/21  8:45 PM  Result Value Ref Range   Magnesium 2.3 1.7 - 2.4 mg/dL    Comment: Performed at [facility], [place] [address] [place], [place] [address]  Phosphorus     Status: Abnormal   Collection Time: 02/20/21  8:45 PM  Result Value Ref Range   Phosphorus 2.1 (L) 2.5 - 4.6 mg/dL    Comment: Performed at [facility], [place] [address], [place], [place] [address]  Resp Panel by RT-PCR (Flu A&B, Covid) Nasopharyngeal Swab     Status: None   Collection Time: 02/20/21 10:19 PM   Specimen: Nasopharyngeal Swab; Nasopharyngeal(NP) swabs in vial transport medium  Result Value Ref Range   SARS Coronavirus 2 by RT PCR NEGATIVE NEGATIVE    Comment: (NOTE) SARS-CoV-2 target nucleic acids are NOT DETECTED.  The SARS-CoV-2 RNA is generally detectable in upper respiratory specimens during the acute phase of infection. The lowest concentration of SARS-CoV-2 viral copies this assay can detect is 138 copies/mL. A negative result does not preclude SARS-Cov-2 infection and should not be used as the sole basis for treatment or other patient management decisions. A negative result may occur with  improper specimen collection/handling, submission of specimen other than nasopharyngeal swab, presence of viral mutation(s) within the areas targeted by this assay, and inadequate number of viral copies(<138 copies/mL). A negative result must be combined with clinical observations, patient history, and epidemiological information. The expected result is Negative.  Fact Sheet for Patients:  EntrepreneurPulse.com.au  Fact Sheet for Healthcare Providers:  IncredibleEmployment.be  This test is no t yet approved or cleared by the [place] FDA and  has been authorized for detection and/or diagnosis of SARS-CoV-2 by FDA under an Emergency Use Authorization (EUA). This EUA will remain   in effect (meaning this test can be used) for the duration of the COVID-19 declaration under Section 564(b)(1) of the Act, 21 U.S.C.section 360bbb-3(b)(1), unless the authorization is terminated  or revoked sooner.       Influenza A by PCR NEGATIVE NEGATIVE   Influenza B by PCR NEGATIVE NEGATIVE    Comment: (NOTE) The Xpert Xpress SARS-CoV-2/FLU/RSV plus assay is intended as an aid in the diagnosis of influenza from Nasopharyngeal swab specimens and should not be used as a sole basis for treatment. Nasal washings and aspirates are unacceptable for Xpert Xpress SARS-CoV-2/FLU/RSV testing.  Fact Sheet for Patients: EntrepreneurPulse.com.au  Fact Sheet for Healthcare Providers: IncredibleEmployment.be  This test is not yet approved or cleared by the [place] FDA and has been authorized for detection and/or diagnosis of SARS-CoV-2 by FDA under an Emergency Use Authorization (EUA). This EUA will remain in effect (meaning this test can be used) for the duration of the COVID-19 declaration under Section 564(b)(1) of the Act, 21 U.S.C. section 360bbb-3(b)(1), unless the authorization is terminated or revoked.  Performed at [facility], [place] [address], [place], [place] [address]   Blood gas, venous     Status: Abnormal   Collection Time: 02/20/21 11:21 PM  Result Value Ref Range   FIO2 21.00    pH, Ven 7.378 7.250 - 7.430   pCO2, Ven 48.0 44.0 - 60.0 mmHg   pO2, Ven 61.9 (H) 32.0 - 45.0 mmHg   Bicarbonate 27.6 20.0 - 28.0 mmol/L   Acid-Base Excess 2.3 (H) 0.0 - 2.0 mmol/L   O2 Saturation 91.8 %   Patient temperature 98.6     Comment: Performed at [facility], [place] [address] [place], [place] [address]  CBG monitoring, ED     Status: Abnormal   Collection Time: 02/21/21  1:45 AM  Result Value Ref Range   Glucose-Capillary 190 (H) 70 - 99 mg/dL    Comment: Glucose reference range applies only to  samples taken after fasting for at least 8 hours.   DG Chest Port 1 View  Result Date: 02/20/2021 CLINICAL DATA:  Shortness of breath EXAM: PORTABLE CHEST 1 VIEW COMPARISON:  07/06/2020 FINDINGS: Heart and mediastinal contours are within normal limits. No focal opacities or effusions. No acute bony abnormality. Small hiatal hernia. IMPRESSION: No active disease. Electronically Signed   By: Rolm Baptise M.D.   On: 02/20/2021 20:29    Pending Labs Unresulted Labs (From admission, onward)          Start     Ordered   02/21/21 0500  Comprehensive metabolic panel  Tomorrow morning,   R        02/21/21 0108   02/21/21 0500  CBC  Tomorrow morning,   R        02/21/21 0108   02/21/21 0109  Hemoglobin A1c  Add-on,   AD       Comments: To assess prior glycemic control    02/21/21 0109          Vitals/Pain Today's Vitals   02/20/21 2300 02/21/21 0000 02/21/21 0100 02/21/21 0200  BP: 137/78 125/65 126/76 117/73  Pulse: 96  92 86  Resp: 16 16 16    Temp:      TempSrc:      SpO2: 96% 98% 96% 96%  PainSc: 0-No pain 0-No pain 0-No pain 0-No pain    Isolation Precautions Airborne and Contact precautions  Medications Medications  0.9 %  sodium chloride infusion ( Intravenous New Bag/Given 02/20/21 2325)  enoxaparin (LOVENOX) injection 40 mg (has no administration in time range)  ondansetron (ZOFRAN) tablet 4 mg (has no administration in time range)    Or  ondansetron (ZOFRAN) injection 4 mg (has no administration in time range)  acetaminophen (TYLENOL) tablet 650 mg (has no administration in time range)    Or  acetaminophen (TYLENOL) suppository 650 mg (has no administration in time range)  benzonatate (TESSALON) capsule 100 mg (has no administration in time range)  ipratropium-albuterol (DUONEB) 0.5-2.5 (3) MG/3ML nebulizer solution 3 mL (3 mLs Nebulization Given 02/21/21 0250)  methylPREDNISolone sodium succinate (SOLU-MEDROL) 40 mg/mL injection 40 mg (has no administration in time  range)  chlorpheniramine-HYDROcodone (TUSSIONEX) 10-8 MG/5ML suspension 5 mL (has no administration in time range)  albuterol (PROVENTIL) (2.5 MG/3ML) 0.083% nebulizer solution 2.5 mg (has no administration in time range)  insulin aspart (novoLOG) injection 0-5 Units (0 Units Subcutaneous Not Given 02/21/21 0154)  insulin aspart (novoLOG) injection 0-15 Units (has no administration in time range)  folic acid (FOLVITE) tablet 1 mg (has no administration in time range)  aspirin EC tablet 81 mg (has no administration in time range)  pantoprazole (PROTONIX) EC tablet 40 mg (has no administration in time range)  fluticasone furoate-vilanterol (BREO ELLIPTA) 200-25 MCG/INH 1 puff (has no administration in time range)  methylPREDNISolone sodium succinate (SOLU-MEDROL) 125 mg/2 mL injection 125 mg (125 mg Intravenous Given 02/20/21 2048)  albuterol (PROVENTIL,VENTOLIN) solution continuous neb (10 mg/hr Nebulization Given 02/20/21 2102)  ipratropium (ATROVENT) nebulizer solution 0.5 mg (0.5 mg Nebulization Given 02/20/21 2102)  magnesium sulfate IVPB 1 g 100 mL (0 g Intravenous Stopped 02/21/21 0029)  albuterol (PROVENTIL) (2.5 MG/3ML) 0.083% nebulizer solution 5 mg (5 mg Nebulization Given 02/20/21 2259)    Mobility walks

## 2021-02-22 DIAGNOSIS — R0603 Acute respiratory distress: Secondary | ICD-10-CM | POA: Diagnosis not present

## 2021-02-22 DIAGNOSIS — Z79899 Other long term (current) drug therapy: Secondary | ICD-10-CM | POA: Diagnosis not present

## 2021-02-22 DIAGNOSIS — Z825 Family history of asthma and other chronic lower respiratory diseases: Secondary | ICD-10-CM | POA: Diagnosis not present

## 2021-02-22 DIAGNOSIS — D573 Sickle-cell trait: Secondary | ICD-10-CM | POA: Diagnosis present

## 2021-02-22 DIAGNOSIS — Z20822 Contact with and (suspected) exposure to covid-19: Secondary | ICD-10-CM | POA: Diagnosis present

## 2021-02-22 DIAGNOSIS — K219 Gastro-esophageal reflux disease without esophagitis: Secondary | ICD-10-CM | POA: Diagnosis present

## 2021-02-22 DIAGNOSIS — J45901 Unspecified asthma with (acute) exacerbation: Secondary | ICD-10-CM | POA: Diagnosis present

## 2021-02-22 DIAGNOSIS — Z7982 Long term (current) use of aspirin: Secondary | ICD-10-CM | POA: Diagnosis not present

## 2021-02-22 DIAGNOSIS — Z880 Allergy status to penicillin: Secondary | ICD-10-CM | POA: Diagnosis not present

## 2021-02-22 DIAGNOSIS — J4541 Moderate persistent asthma with (acute) exacerbation: Secondary | ICD-10-CM | POA: Diagnosis present

## 2021-02-22 DIAGNOSIS — M170 Bilateral primary osteoarthritis of knee: Secondary | ICD-10-CM | POA: Diagnosis present

## 2021-02-22 DIAGNOSIS — Z8673 Personal history of transient ischemic attack (TIA), and cerebral infarction without residual deficits: Secondary | ICD-10-CM | POA: Diagnosis not present

## 2021-02-22 DIAGNOSIS — Z90710 Acquired absence of both cervix and uterus: Secondary | ICD-10-CM | POA: Diagnosis not present

## 2021-02-22 DIAGNOSIS — D473 Essential (hemorrhagic) thrombocythemia: Secondary | ICD-10-CM | POA: Diagnosis present

## 2021-02-22 DIAGNOSIS — J449 Chronic obstructive pulmonary disease, unspecified: Secondary | ICD-10-CM | POA: Diagnosis present

## 2021-02-22 DIAGNOSIS — Z86010 Personal history of colonic polyps: Secondary | ICD-10-CM | POA: Diagnosis not present

## 2021-02-22 DIAGNOSIS — E669 Obesity, unspecified: Secondary | ICD-10-CM | POA: Diagnosis present

## 2021-02-22 DIAGNOSIS — J383 Other diseases of vocal cords: Secondary | ICD-10-CM | POA: Diagnosis present

## 2021-02-22 DIAGNOSIS — R0600 Dyspnea, unspecified: Secondary | ICD-10-CM | POA: Diagnosis present

## 2021-02-22 DIAGNOSIS — E785 Hyperlipidemia, unspecified: Secondary | ICD-10-CM | POA: Diagnosis present

## 2021-02-22 DIAGNOSIS — Z6830 Body mass index (BMI) 30.0-30.9, adult: Secondary | ICD-10-CM | POA: Diagnosis not present

## 2021-02-22 DIAGNOSIS — M810 Age-related osteoporosis without current pathological fracture: Secondary | ICD-10-CM | POA: Diagnosis present

## 2021-02-22 DIAGNOSIS — Z7951 Long term (current) use of inhaled steroids: Secondary | ICD-10-CM | POA: Diagnosis not present

## 2021-02-22 DIAGNOSIS — R7303 Prediabetes: Secondary | ICD-10-CM | POA: Diagnosis present

## 2021-02-22 LAB — CBC WITH DIFFERENTIAL/PLATELET
Abs Immature Granulocytes: 0.07 10*3/uL (ref 0.00–0.07)
Basophils Absolute: 0 10*3/uL (ref 0.0–0.1)
Basophils Relative: 0 %
Eosinophils Absolute: 0 10*3/uL (ref 0.0–0.5)
Eosinophils Relative: 0 %
HCT: 35.2 % — ABNORMAL LOW (ref 36.0–46.0)
Hemoglobin: 11.5 g/dL — ABNORMAL LOW (ref 12.0–15.0)
Immature Granulocytes: 1 %
Lymphocytes Relative: 11 %
Lymphs Abs: 0.9 10*3/uL (ref 0.7–4.0)
MCH: 27.8 pg (ref 26.0–34.0)
MCHC: 32.7 g/dL (ref 30.0–36.0)
MCV: 85.2 fL (ref 80.0–100.0)
Monocytes Absolute: 0.4 10*3/uL (ref 0.1–1.0)
Monocytes Relative: 5 %
Neutro Abs: 6.9 10*3/uL (ref 1.7–7.7)
Neutrophils Relative %: 83 %
Platelets: 491 10*3/uL — ABNORMAL HIGH (ref 150–400)
RBC: 4.13 MIL/uL (ref 3.87–5.11)
RDW: 16.2 % — ABNORMAL HIGH (ref 11.5–15.5)
WBC: 8.3 10*3/uL (ref 4.0–10.5)
nRBC: 0 % (ref 0.0–0.2)

## 2021-02-22 LAB — GLUCOSE, CAPILLARY
Glucose-Capillary: 131 mg/dL — ABNORMAL HIGH (ref 70–99)
Glucose-Capillary: 122 mg/dL — ABNORMAL HIGH (ref 70–99)
Glucose-Capillary: 162 mg/dL — ABNORMAL HIGH (ref 70–99)
Glucose-Capillary: 157 mg/dL — ABNORMAL HIGH (ref 70–99)

## 2021-02-22 LAB — BASIC METABOLIC PANEL
Anion gap: 8 (ref 5–15)
BUN: 23 mg/dL (ref 8–23)
CO2: 25 mmol/L (ref 22–32)
Calcium: 8.9 mg/dL (ref 8.9–10.3)
Chloride: 104 mmol/L (ref 98–111)
Creatinine, Ser: 1.03 mg/dL — ABNORMAL HIGH (ref 0.44–1.00)
GFR, Estimated: 57 mL/min — ABNORMAL LOW (ref >60)
Glucose, Bld: 150 mg/dL — ABNORMAL HIGH (ref 70–99)
Potassium: 4.2 mmol/L (ref 3.5–5.1)
Sodium: 137 mmol/L (ref 135–145)

## 2021-02-22 MED ORDER — IPRATROPIUM-ALBUTEROL 0.5-2.5 (3) MG/3ML IN SOLN
3.0000 mL | Freq: Three times a day (TID) | RESPIRATORY_TRACT | Status: DC
  Administered 2021-02-23 – 2021-02-24 (×4): 3 mL via RESPIRATORY_TRACT
  Filled 2021-02-22 (×4): qty 3

## 2021-02-22 MED ORDER — FLUCONAZOLE 150 MG PO TABS
150.0000 mg | ORAL_TABLET | Freq: Once | ORAL | Status: AC
  Administered 2021-02-22: 150 mg via ORAL
  Filled 2021-02-22: qty 1

## 2021-02-22 MED ORDER — SENNOSIDES-DOCUSATE SODIUM 8.6-50 MG PO TABS
1.0000 | ORAL_TABLET | Freq: Two times a day (BID) | ORAL | Status: DC
  Administered 2021-02-22 – 2021-02-24 (×5): 1 via ORAL
  Filled 2021-02-22 (×5): qty 1

## 2021-02-22 MED ORDER — CLOTRIMAZOLE 1 % VA CREA
1.0000 | TOPICAL_CREAM | Freq: Every day | VAGINAL | Status: DC
  Administered 2021-02-22 – 2021-02-23 (×2): 1 via VAGINAL
  Filled 2021-02-22: qty 45

## 2021-02-22 MED ORDER — POLYETHYLENE GLYCOL 3350 17 G PO PACK
17.0000 g | PACK | Freq: Every day | ORAL | Status: DC
  Administered 2021-02-22 – 2021-02-24 (×3): 17 g via ORAL
  Filled 2021-02-22 (×3): qty 1

## 2021-02-22 NOTE — Progress Notes (Signed)
PROGRESS NOTE  [name] ERX:[id number] DOB: 1946-03-16 DOA: 02/20/2021 PCP: Merrilee Seashore, MD  HPI/Recap of past 24 hours: HPI from Dr [name] is a 75 y.o. female, with PMH of RA, moderate persistent asthma, chronic cough, GERD, vocal cord dysfunction, allergic rhinitis who presented to the ER on 02/20/2021 with dyspnea and findings concerning with asthma exacerbation. Patient states over the past 2 days she has had worsening, progressive dyspnea, wheezing, and cough. She has been compliant with her home inhaler regiment, Breztri BID, and nebulizer as needed.  She has tried her nebulizer without improvement.  She is currently out of her rescue inhaler, but does not routinely use this.  Denies any sick contacts or fever.  She says her last asthma exacerbation was over a year ago.  Has not been on steroids since then.  She has never been intubated for her asthma.  Does not use oxygen at home.  Non-smoker.  No recent changes in medication. In the ED, vital signs fairly stable, Covid negative, chest x-ray with no acute changes, labs fairly stable.  Patient with significant wheezing, was given Solu-Medrol, magnesium, albuterol in the ED without any improvement.  Patient admitted for further management.    Today, patient still wheezing, with coughing spells noted.  Denies any chest pain, worsening shortness of breath, abdominal pain, nausea/vomiting, fever/chills.    Assessment/Plan: Active Problems:   Vocal cord dysfunction   GERD (gastroesophageal reflux disease)   Essential thrombocythemia (HCC)   Acute respiratory distress   Asthma exacerbation   Hyperglycemia   Acute asthma exacerbation   Acute asthma exacerbation History of moderate persistent asthma/vocal cord dysfunction/allergic rhinitis Currently on room air, but noted significant bilateral wheezing Chest x-ray unremarkable Continue Solu-Medrol, duo nebs, inhalers, cough suppressants, singular Monitor  closely  History of prediabetes History of hyperglycemia associated with steroid use Last A1c 5.8 SSI, Accu-Cheks, hypoglycemic protocol  Essential thrombocythemia, sickle cell trait Continue folic acid, aspirin, hydroxyurea  GERD Continue PPI  Hyperlipidemia Continue statins  Obesity Lifestyle modification advised    Estimated body mass index is 30.58 kg/m as calculated from the following:   Height as of this encounter: 5\' 2"  (1.575 m).   Weight as of this encounter: 75.8 kg.     Code Status: Full  Family Communication: Discussed with patient extensively  Disposition Plan: Status is: Inpatient  The patient will require care spanning > 2 midnights and should be moved to inpatient because: Inpatient level of care appropriate due to severity of illness  Dispo: The patient is from: Home              Anticipated d/c is to: Home              Patient currently is not medically stable to d/c.   Difficult to place patient No    Consultants:  None  Procedures:  None  Antimicrobials:  None  DVT prophylaxis: Lovenox   Objective: Vitals:   02/22/21 0439 02/22/21 0732 02/22/21 1325 02/22/21 1350  BP: 120/65  131/75   Pulse: 78  79   Resp: 14  19   Temp: 98.1 F (36.7 C)  98.9 F (37.2 C)   TempSrc: Oral  Oral   SpO2: 100% 93% 100% 99%  Weight:      Height:        Intake/Output Summary (Last 24 hours) at 02/22/2021 1545 Last data filed at 02/22/2021 1300 Gross per 24 hour  Intake 720 ml  Output 200 ml  Net 520 ml   Filed Weights   02/21/21 0440  Weight: 75.8 kg    Exam:  General:  NAD  Cardiovascular: S1, S2 present  Respiratory:  Bilateral wheezing noted  Abdomen: Soft, nontender, nondistended, bowel sounds present  Musculoskeletal: No bilateral pedal edema noted  Skin: Normal  Psychiatry: Normal mood   Data Reviewed: CBC: Recent Labs  Lab 02/20/21 2045 02/21/21 0512 02/22/21 0550  WBC 9.3 7.9 8.3  NEUTROABS  --  6.9 6.9   HGB 12.1 11.9* 11.5*  HCT 38.1 37.3 35.2*  MCV 86.4 85.7 85.2  PLT 556* 518* 536*   Basic Metabolic Panel: Recent Labs  Lab 02/20/21 2045 02/21/21 0512 02/22/21 0550  NA 137 139 137  K 4.0 4.0 4.2  CL 104 103 104  CO2 22 25 25   GLUCOSE 200* 170* 150*  BUN 15 17 23   CREATININE 1.03* 0.96 1.03*  CALCIUM 9.3 9.1 8.9  MG 2.3  --   --   PHOS 2.1*  --   --    GFR: Estimated Creatinine Clearance: 45 mL/min (A) (by C-G formula based on SCr of 1.03 mg/dL (H)). Liver Function Tests: Recent Labs  Lab 02/21/21 0512  AST 23  ALT 24  ALKPHOS 54  BILITOT 0.5  PROT 6.4*  ALBUMIN 3.5   No results for input(s): LIPASE, AMYLASE in the last 168 hours. No results for input(s): AMMONIA in the last 168 hours. Coagulation Profile: No results for input(s): INR, PROTIME in the last 168 hours. Cardiac Enzymes: No results for input(s): CKTOTAL, CKMB, CKMBINDEX, TROPONINI in the last 168 hours. BNP (last 3 results) Recent Labs    06/16/20 0901  PROBNP 102   HbA1C: Recent Labs    02/20/21 2045  HGBA1C 5.8*   CBG: Recent Labs  Lab 02/21/21 1219 02/21/21 1600 02/21/21 2105 02/22/21 0750 02/22/21 1141  GLUCAP 124* 103* 110* 131* 122*   Lipid Profile: No results for input(s): CHOL, HDL, LDLCALC, TRIG, CHOLHDL, LDLDIRECT in the last 72 hours. Thyroid Function Tests: No results for input(s): TSH, T4TOTAL, FREET4, T3FREE, THYROIDAB in the last 72 hours. Anemia Panel: No results for input(s): VITAMINB12, FOLATE, FERRITIN, TIBC, IRON, RETICCTPCT in the last 72 hours. Urine analysis:    Component Value Date/Time   COLORURINE YELLOW 09/01/2012 2014   APPEARANCEUR CLOUDY (A) 09/01/2012 2014   LABSPEC 1.021 09/01/2012 2014   PHURINE 5.5 09/01/2012 2014   GLUCOSEU NEGATIVE 09/01/2012 2014   GLUCOSEU NEGATIVE 01/11/2011 0950   HGBUR NEGATIVE 09/01/2012 2014   BILIRUBINUR NEGATIVE 09/01/2012 2014   [place] NEGATIVE 09/01/2012 2014   PROTEINUR NEGATIVE 09/01/2012 2014    UROBILINOGEN 1.0 09/01/2012 2014   NITRITE NEGATIVE 09/01/2012 2014   LEUKOCYTESUR TRACE (A) 09/01/2012 2014   Sepsis Labs: @LABRCNTIP (procalcitonin:4,lacticidven:4)  ) Recent Results (from the past 240 hour(s))  Resp Panel by RT-PCR (Flu A&B, Covid) Nasopharyngeal Swab     Status: None   Collection Time: 02/20/21 10:19 PM   Specimen: Nasopharyngeal Swab; Nasopharyngeal(NP) swabs in vial transport medium  Result Value Ref Range Status   SARS Coronavirus 2 by RT PCR NEGATIVE NEGATIVE Final    Comment: (NOTE) SARS-CoV-2 target nucleic acids are NOT DETECTED.  The SARS-CoV-2 RNA is generally detectable in upper respiratory specimens during the acute phase of infection. The lowest concentration of SARS-CoV-2 viral copies this assay can detect is 138 copies/mL. A negative result does not preclude SARS-Cov-2 infection and should not be used as the sole basis for treatment or other patient management decisions. A  negative result may occur with  improper specimen collection/handling, submission of specimen other than nasopharyngeal swab, presence of viral mutation(s) within the areas targeted by this assay, and inadequate number of viral copies(<138 copies/mL). A negative result must be combined with clinical observations, patient history, and epidemiological information. The expected result is Negative.  Fact Sheet for Patients:  EntrepreneurPulse.com.au  Fact Sheet for Healthcare Providers:  IncredibleEmployment.be  This test is no t yet approved or cleared by the [place] FDA and  has been authorized for detection and/or diagnosis of SARS-CoV-2 by FDA under an Emergency Use Authorization (EUA). This EUA will remain  in effect (meaning this test can be used) for the duration of the COVID-19 declaration under Section 564(b)(1) of the Act, 21 U.S.C.section 360bbb-3(b)(1), unless the authorization is terminated  or revoked sooner.        Influenza A by PCR NEGATIVE NEGATIVE Final   Influenza B by PCR NEGATIVE NEGATIVE Final    Comment: (NOTE) The Xpert Xpress SARS-CoV-2/FLU/RSV plus assay is intended as an aid in the diagnosis of influenza from Nasopharyngeal swab specimens and should not be used as a sole basis for treatment. Nasal washings and aspirates are unacceptable for Xpert Xpress SARS-CoV-2/FLU/RSV testing.  Fact Sheet for Patients: EntrepreneurPulse.com.au  Fact Sheet for Healthcare Providers: IncredibleEmployment.be  This test is not yet approved or cleared by the [place] FDA and has been authorized for detection and/or diagnosis of SARS-CoV-2 by FDA under an Emergency Use Authorization (EUA). This EUA will remain in effect (meaning this test can be used) for the duration of the COVID-19 declaration under Section 564(b)(1) of the Act, 21 U.S.C. section 360bbb-3(b)(1), unless the authorization is terminated or revoked.  Performed at [facility], [place] [address], [place], [place] [address]       Studies: No results found.  Scheduled Meds: . aspirin EC  81 mg Oral Daily  . cholecalciferol  1,000 Units Oral Daily  . clotrimazole  1 Applicatorful Vaginal QHS  . enoxaparin (LOVENOX) injection  40 mg Subcutaneous Q24H  . fluticasone  1 spray Each Nare BID  . fluticasone furoate-vilanterol  1 puff Inhalation Daily  . folic acid  1 mg Oral Daily  . gabapentin  100 mg Oral QHS  . guaiFENesin  600 mg Oral BID  . hydroxyurea  500 mg Oral TID AC  . insulin aspart  0-15 Units Subcutaneous TID WC  . insulin aspart  0-5 Units Subcutaneous QHS  . ipratropium-albuterol  3 mL Nebulization Q6H  . methylPREDNISolone (SOLU-MEDROL) injection  60 mg Intravenous Q12H  . montelukast  10 mg Oral QHS  . ofloxacin  1 drop Right Eye BID  . pantoprazole  40 mg Oral Daily  . polyethylene glycol  17 g Oral Daily  . prednisoLONE acetate  1 drop Right Eye BID  .  rosuvastatin  10 mg Oral QHS  . senna-docusate  1 tablet Oral BID    Continuous Infusions: . sodium chloride 10 mL/hr at 02/20/21 2325     LOS: 0 days     Alma Friendly, MD [facility]  If 7PM-7AM, please contact night-coverage [email or web address] 02/22/2021, 3:45 PM

## 2021-02-23 ENCOUNTER — Other Ambulatory Visit: Payer: Self-pay | Admitting: *Deleted

## 2021-02-23 LAB — CBC WITH DIFFERENTIAL/PLATELET
Abs Immature Granulocytes: 0.11 K/uL — ABNORMAL HIGH (ref 0.00–0.07)
Basophils Absolute: 0 K/uL (ref 0.0–0.1)
Basophils Relative: 0 %
Eosinophils Absolute: 0 K/uL (ref 0.0–0.5)
Eosinophils Relative: 0 %
HCT: 37.4 % (ref 36.0–46.0)
Hemoglobin: 12.2 g/dL (ref 12.0–15.0)
Immature Granulocytes: 1 %
Lymphocytes Relative: 12 %
Lymphs Abs: 1.1 K/uL (ref 0.7–4.0)
MCH: 27.7 pg (ref 26.0–34.0)
MCHC: 32.6 g/dL (ref 30.0–36.0)
MCV: 84.8 fL (ref 80.0–100.0)
Monocytes Absolute: 0.6 K/uL (ref 0.1–1.0)
Monocytes Relative: 6 %
Neutro Abs: 7.4 K/uL (ref 1.7–7.7)
Neutrophils Relative %: 81 %
Platelets: 551 K/uL — ABNORMAL HIGH (ref 150–400)
RBC: 4.41 MIL/uL (ref 3.87–5.11)
RDW: 16.7 % — ABNORMAL HIGH (ref 11.5–15.5)
WBC: 9.1 K/uL (ref 4.0–10.5)
nRBC: 0 % (ref 0.0–0.2)

## 2021-02-23 LAB — D-DIMER, QUANTITATIVE: D-Dimer, Quant: 0.49 ug/mL-FEU (ref 0.00–0.50)

## 2021-02-23 LAB — BASIC METABOLIC PANEL WITH GFR
Anion gap: 9 (ref 5–15)
BUN: 20 mg/dL (ref 8–23)
CO2: 26 mmol/L (ref 22–32)
Calcium: 9.3 mg/dL (ref 8.9–10.3)
Chloride: 105 mmol/L (ref 98–111)
Creatinine, Ser: 0.98 mg/dL (ref 0.44–1.00)
GFR, Estimated: >60 mL/min
Glucose, Bld: 138 mg/dL — ABNORMAL HIGH (ref 70–99)
Potassium: 4.3 mmol/L (ref 3.5–5.1)
Sodium: 140 mmol/L (ref 135–145)

## 2021-02-23 LAB — GLUCOSE, CAPILLARY
Glucose-Capillary: 115 mg/dL — ABNORMAL HIGH (ref 70–99)
Glucose-Capillary: 196 mg/dL — ABNORMAL HIGH (ref 70–99)
Glucose-Capillary: 135 mg/dL — ABNORMAL HIGH (ref 70–99)
Glucose-Capillary: 113 mg/dL — ABNORMAL HIGH (ref 70–99)

## 2021-02-23 MED ORDER — AZITHROMYCIN 250 MG PO TABS
500.0000 mg | ORAL_TABLET | Freq: Every day | ORAL | Status: DC
  Administered 2021-02-23 – 2021-02-24 (×2): 500 mg via ORAL
  Filled 2021-02-23 (×2): qty 2

## 2021-02-23 MED ORDER — PREDNISONE 20 MG PO TABS
40.0000 mg | ORAL_TABLET | Freq: Every day | ORAL | Status: DC
  Administered 2021-02-24: 40 mg via ORAL
  Filled 2021-02-23: qty 2

## 2021-02-23 MED ORDER — METHYLPREDNISOLONE SODIUM SUCC 40 MG IJ SOLR
40.0000 mg | Freq: Two times a day (BID) | INTRAMUSCULAR | Status: AC
  Administered 2021-02-23: 40 mg via INTRAVENOUS
  Filled 2021-02-23: qty 1

## 2021-02-23 NOTE — Progress Notes (Signed)
PROGRESS NOTE  [name] [name]:[id number] DOB: 1946/04/01 DOA: 02/20/2021 PCP: [name] Seashore, MD  HPI/Recap of past 24 hours: HPI from Dr Edison Simon is a 75 y.o. female, with PMH of RA, moderate persistent asthma, chronic cough, GERD, vocal cord dysfunction, allergic rhinitis who presented to the ER on 02/20/2021 with dyspnea and findings concerning with asthma exacerbation. Patient states over the past 2 days she has had worsening, progressive dyspnea, wheezing, and cough. She has been compliant with her home inhaler regiment, Breztri BID, and nebulizer as needed.  She has tried her nebulizer without improvement.  She is currently out of her rescue inhaler, but does not routinely use this.  Denies any sick contacts or fever.  She says her last asthma exacerbation was over a year ago.  Has not been on steroids since then.  She has never been intubated for her asthma.  Does not use oxygen at home.  Non-smoker.  No recent changes in medication. In the ED, vital signs fairly stable, Covid negative, chest x-ray with no acute changes, labs fairly stable.  Patient with significant wheezing, was given Solu-Medrol, magnesium, albuterol in the ED without any improvement.  Patient admitted for further management.     Today, noted to still be wheezing, with coughing spells.  Reports some improvement overall.  Attempted to ambulate, but got short of breath, but never desaturated.  Still not at her baseline.     Assessment/Plan: Active Problems:   Vocal cord dysfunction   GERD (gastroesophageal reflux disease)   Essential thrombocythemia (HCC)   Acute respiratory distress   Asthma exacerbation   Hyperglycemia   Acute asthma exacerbation   Acute asthma exacerbation History of moderate persistent asthma/vocal cord dysfunction/allergic rhinitis Currently on room air, but noted bilateral wheezing Chest x-ray unremarkable D-dimer pending, if positive may benefit from CTA chest,  unlikely PE though, but to rule out any pneumonia Taper Solu-Medrol-->prednisone, continue duo nebs, inhalers, cough suppressants, singular Started on azithromycin due to anti-inflammatory properties, for 5 days Monitor closely  History of prediabetes History of hyperglycemia associated with steroid use Last A1c 5.8 SSI, Accu-Cheks, hypoglycemic protocol  Essential thrombocythemia, sickle cell trait Continue folic acid, aspirin, hydroxyurea  GERD Continue PPI  Hyperlipidemia Continue statins  Obesity Lifestyle modification advised    Estimated body mass index is 30.58 kg/m as calculated from the following:   Height as of this encounter: 5\' 2"  (1.575 m).   Weight as of this encounter: 75.8 kg.     Code Status: Full  Family Communication: Discussed with patient extensively  Disposition Plan: Status is: Inpatient  The patient will require care spanning > 2 midnights and should be moved to inpatient because: Inpatient level of care appropriate due to severity of illness  Dispo: The patient is from: Home              Anticipated d/c is to: Home              Patient currently is not medically stable to d/c.   Difficult to place patient No    Consultants:  None  Procedures:  None  Antimicrobials:  Azithromycin  DVT prophylaxis: Lovenox   Objective: Vitals:   02/23/21 0816 02/23/21 1334 02/23/21 1334 02/23/21 1427  BP:  134/88 134/88   Pulse:  90 90   Resp:  16 16   Temp:  (!) 97.4 F (36.3 C) (!) 97.4 F (36.3 C)   TempSrc:  Oral Oral   SpO2: 95% 100%  100% 99%  Weight:      Height:        Intake/Output Summary (Last 24 hours) at 02/23/2021 1559 Last data filed at 02/23/2021 1300 Gross per 24 hour  Intake 655 ml  Output -  Net 655 ml   Filed Weights   02/21/21 0440  Weight: 75.8 kg    Exam:  General:  NAD  Cardiovascular: S1, S2 present  Respiratory:  Bilateral wheezing noted, coarse breath sounds  Abdomen: Soft, nontender,  nondistended, bowel sounds present  Musculoskeletal: No bilateral pedal edema noted  Skin: Normal  Psychiatry: Normal mood   Data Reviewed: CBC: Recent Labs  Lab 02/20/21 2045 02/21/21 0512 02/22/21 0550 02/23/21 0616  WBC 9.3 7.9 8.3 9.1  NEUTROABS  --  6.9 6.9 7.4  HGB 12.1 11.9* 11.5* 12.2  HCT 38.1 37.3 35.2* 37.4  MCV 86.4 85.7 85.2 84.8  PLT 556* 518* 491* 751*   Basic Metabolic Panel: Recent Labs  Lab 02/20/21 2045 02/21/21 0512 02/22/21 0550 02/23/21 0616  NA 137 139 137 140  K 4.0 4.0 4.2 4.3  CL 104 103 104 105  CO2 22 25 25 26   GLUCOSE 200* 170* 150* 138*  BUN 15 17 23 20   CREATININE 1.03* 0.96 1.03* 0.98  CALCIUM 9.3 9.1 8.9 9.3  MG 2.3  --   --   --   PHOS 2.1*  --   --   --    GFR: Estimated Creatinine Clearance: 47.3 mL/min (by C-G formula based on SCr of 0.98 mg/dL). Liver Function Tests: Recent Labs  Lab 02/21/21 0512  AST 23  ALT 24  ALKPHOS 54  BILITOT 0.5  PROT 6.4*  ALBUMIN 3.5   No results for input(s): LIPASE, AMYLASE in the last 168 hours. No results for input(s): AMMONIA in the last 168 hours. Coagulation Profile: No results for input(s): INR, PROTIME in the last 168 hours. Cardiac Enzymes: No results for input(s): CKTOTAL, CKMB, CKMBINDEX, TROPONINI in the last 168 hours. BNP (last 3 results) Recent Labs    06/16/20 0901  PROBNP 102   HbA1C: Recent Labs    02/20/21 2045  HGBA1C 5.8*   CBG: Recent Labs  Lab 02/22/21 1141 02/22/21 1700 02/22/21 2208 02/23/21 0739 02/23/21 1202  GLUCAP 122* 162* 157* 115* 196*   Lipid Profile: No results for input(s): CHOL, HDL, LDLCALC, TRIG, CHOLHDL, LDLDIRECT in the last 72 hours. Thyroid Function Tests: No results for input(s): TSH, T4TOTAL, FREET4, T3FREE, THYROIDAB in the last 72 hours. Anemia Panel: No results for input(s): VITAMINB12, FOLATE, FERRITIN, TIBC, IRON, RETICCTPCT in the last 72 hours. Urine analysis:    Component Value Date/Time   COLORURINE YELLOW  09/01/2012 2014   APPEARANCEUR CLOUDY (A) 09/01/2012 2014   LABSPEC 1.021 09/01/2012 2014   PHURINE 5.5 09/01/2012 2014   GLUCOSEU NEGATIVE 09/01/2012 2014   GLUCOSEU NEGATIVE 01/11/2011 0950   HGBUR NEGATIVE 09/01/2012 2014   BILIRUBINUR NEGATIVE 09/01/2012 2014   [place] 09/01/2012 2014   PROTEINUR NEGATIVE 09/01/2012 2014   UROBILINOGEN 1.0 09/01/2012 2014   NITRITE NEGATIVE 09/01/2012 2014   LEUKOCYTESUR TRACE (A) 09/01/2012 2014   Sepsis Labs: @LABRCNTIP (procalcitonin:4,lacticidven:4)  ) Recent Results (from the past 240 hour(s))  Resp Panel by RT-PCR (Flu A&B, Covid) Nasopharyngeal Swab     Status: None   Collection Time: 02/20/21 10:19 PM   Specimen: Nasopharyngeal Swab; Nasopharyngeal(NP) swabs in vial transport medium  Result Value Ref Range Status   SARS Coronavirus 2 by RT PCR NEGATIVE NEGATIVE Final  Comment: (NOTE) SARS-CoV-2 target nucleic acids are NOT DETECTED.  The SARS-CoV-2 RNA is generally detectable in upper respiratory specimens during the acute phase of infection. The lowest concentration of SARS-CoV-2 viral copies this assay can detect is 138 copies/mL. A negative result does not preclude SARS-Cov-2 infection and should not be used as the sole basis for treatment or other patient management decisions. A negative result may occur with  improper specimen collection/handling, submission of specimen other than nasopharyngeal swab, presence of viral mutation(s) within the areas targeted by this assay, and inadequate number of viral copies(<138 copies/mL). A negative result must be combined with clinical observations, patient history, and epidemiological information. The expected result is Negative.  Fact Sheet for Patients:  EntrepreneurPulse.com.au  Fact Sheet for Healthcare Providers:  IncredibleEmployment.be  This test is no t yet approved or cleared by the [place] FDA and  has been authorized  for detection and/or diagnosis of SARS-CoV-2 by FDA under an Emergency Use Authorization (EUA). This EUA will remain  in effect (meaning this test can be used) for the duration of the COVID-19 declaration under Section 564(b)(1) of the Act, 21 U.S.C.section 360bbb-3(b)(1), unless the authorization is terminated  or revoked sooner.       Influenza A by PCR NEGATIVE NEGATIVE Final   Influenza B by PCR NEGATIVE NEGATIVE Final    Comment: (NOTE) The Xpert Xpress SARS-CoV-2/FLU/RSV plus assay is intended as an aid in the diagnosis of influenza from Nasopharyngeal swab specimens and should not be used as a sole basis for treatment. Nasal washings and aspirates are unacceptable for Xpert Xpress SARS-CoV-2/FLU/RSV testing.  Fact Sheet for Patients: EntrepreneurPulse.com.au  Fact Sheet for Healthcare Providers: IncredibleEmployment.be  This test is not yet approved or cleared by the [place] FDA and has been authorized for detection and/or diagnosis of SARS-CoV-2 by FDA under an Emergency Use Authorization (EUA). This EUA will remain in effect (meaning this test can be used) for the duration of the COVID-19 declaration under Section 564(b)(1) of the Act, 21 U.S.C. section 360bbb-3(b)(1), unless the authorization is terminated or revoked.  Performed at [facility], [place] [address], [place], [place] [address]       Studies: No results found.  Scheduled Meds: . aspirin EC  81 mg Oral Daily  . cholecalciferol  1,000 Units Oral Daily  . clotrimazole  1 Applicatorful Vaginal QHS  . enoxaparin (LOVENOX) injection  40 mg Subcutaneous Q24H  . fluticasone  1 spray Each Nare BID  . fluticasone furoate-vilanterol  1 puff Inhalation Daily  . folic acid  1 mg Oral Daily  . gabapentin  100 mg Oral QHS  . guaiFENesin  600 mg Oral BID  . hydroxyurea  500 mg Oral TID AC  . insulin aspart  0-15 Units Subcutaneous TID WC  . insulin  aspart  0-5 Units Subcutaneous QHS  . ipratropium-albuterol  3 mL Nebulization TID  . methylPREDNISolone (SOLU-MEDROL) injection  60 mg Intravenous Q12H  . montelukast  10 mg Oral QHS  . ofloxacin  1 drop Right Eye BID  . pantoprazole  40 mg Oral Daily  . polyethylene glycol  17 g Oral Daily  . prednisoLONE acetate  1 drop Right Eye BID  . rosuvastatin  10 mg Oral QHS  . senna-docusate  1 tablet Oral BID    Continuous Infusions: . sodium chloride 10 mL/hr at 02/20/21 2325     LOS: 1 day     Alma Friendly, MD [facility]  If 7PM-7AM,  please contact night-coverage [email or web address] 02/23/2021, 3:59 PM

## 2021-02-23 NOTE — Patient Outreach (Signed)
[facility]) Care Management  02/23/2021  [name] March 15, 1946 [id number]  Patient was admitted to the hospital 02/20/21. Nurse Health Coach will perform case closure and transfer patient to the [place] Team. Nurse has informed [facility] Coordinator of patient's admission.    Plan: RN Health Coach Discipline Closure Discipline Closure letter sent to PCP   Emelia Loron RN, [place] [phone] [name]

## 2021-02-24 ENCOUNTER — Encounter: Payer: Self-pay | Admitting: Internal Medicine

## 2021-02-24 DIAGNOSIS — J45901 Unspecified asthma with (acute) exacerbation: Secondary | ICD-10-CM

## 2021-02-24 LAB — CBC WITH DIFFERENTIAL/PLATELET
Abs Immature Granulocytes: 0.14 10*3/uL — ABNORMAL HIGH (ref 0.00–0.07)
Basophils Absolute: 0 10*3/uL (ref 0.0–0.1)
Basophils Relative: 0 %
Eosinophils Absolute: 0 10*3/uL (ref 0.0–0.5)
Eosinophils Relative: 0 %
HCT: 37.6 % (ref 36.0–46.0)
Hemoglobin: 12.2 g/dL (ref 12.0–15.0)
Immature Granulocytes: 2 %
Lymphocytes Relative: 15 %
Lymphs Abs: 1.4 10*3/uL (ref 0.7–4.0)
MCH: 27.3 pg (ref 26.0–34.0)
MCHC: 32.4 g/dL (ref 30.0–36.0)
MCV: 84.1 fL (ref 80.0–100.0)
Monocytes Absolute: 0.6 10*3/uL (ref 0.1–1.0)
Monocytes Relative: 7 %
Neutro Abs: 6.9 10*3/uL (ref 1.7–7.7)
Neutrophils Relative %: 76 %
Platelets: 475 10*3/uL — ABNORMAL HIGH (ref 150–400)
RBC: 4.47 MIL/uL (ref 3.87–5.11)
RDW: 16.1 % — ABNORMAL HIGH (ref 11.5–15.5)
WBC: 9 10*3/uL (ref 4.0–10.5)
nRBC: 0 % (ref 0.0–0.2)

## 2021-02-24 LAB — BASIC METABOLIC PANEL
Anion gap: 10 (ref 5–15)
BUN: 30 mg/dL — ABNORMAL HIGH (ref 8–23)
CO2: 26 mmol/L (ref 22–32)
Calcium: 9 mg/dL (ref 8.9–10.3)
Chloride: 101 mmol/L (ref 98–111)
Creatinine, Ser: 1.01 mg/dL — ABNORMAL HIGH (ref 0.44–1.00)
GFR, Estimated: 58 mL/min — ABNORMAL LOW (ref >60)
Glucose, Bld: 133 mg/dL — ABNORMAL HIGH (ref 70–99)
Potassium: 4.2 mmol/L (ref 3.5–5.1)
Sodium: 137 mmol/L (ref 135–145)

## 2021-02-24 LAB — GLUCOSE, CAPILLARY: Glucose-Capillary: 117 mg/dL — ABNORMAL HIGH (ref 70–99)

## 2021-02-24 MED ORDER — PREDNISONE 10 MG PO TABS: ORAL_TABLET | ORAL | 0 refills | Status: AC

## 2021-02-24 MED ORDER — ALBUTEROL SULFATE HFA 108 (90 BASE) MCG/ACT IN AERS: 2.0000 | INHALATION_SPRAY | RESPIRATORY_TRACT | 1 refills | Status: DC | PRN

## 2021-02-24 MED ORDER — ALBUTEROL SULFATE (2.5 MG/3ML) 0.083% IN NEBU: 2.5000 mg | INHALATION_SOLUTION | RESPIRATORY_TRACT | 3 refills | Status: DC | PRN

## 2021-02-24 MED ORDER — AZITHROMYCIN 250 MG PO TABS: 500.0000 mg | ORAL_TABLET | Freq: Every day | ORAL | 0 refills | Status: AC

## 2021-02-24 NOTE — Plan of Care (Signed)

## 2021-02-24 NOTE — Progress Notes (Signed)
SATURATION QUALIFICATIONS: (This note is used to comply with regulatory documentation for home oxygen)  Patient Saturations on Room Air at Rest = 99%  Patient Saturations on Room Air while Ambulating = 98%  Patient Saturations on 0 Liters of oxygen while Ambulating = 0%  Please briefly explain why patient needs home oxygen: O2 home evaluation

## 2021-02-24 NOTE — Discharge Summary (Signed)
Physician Discharge Summary  [name]:[id number] DOB: 02/15/1946 DOA: 02/20/2021  PCP: [name] Seashore, MD  Admit date: 02/20/2021 Discharge date: 02/24/2021  Time spent: 40 minutes  Recommendations for Outpatient Follow-up:  1. Follow outpatient CBC/CMP 2. Follow with pulmonology outpatient for Asthma/COPD overlap - discharged on steroid taper 3. Follow CXR outpatient   4. Follow prediabetes outpatient  Discharge Diagnoses:  Active Problems:   Vocal cord dysfunction   GERD (gastroesophageal reflux disease)   Essential thrombocythemia ([place])   Acute respiratory distress   Asthma exacerbation   Hyperglycemia   Acute asthma exacerbation   Discharge Condition: stable  Diet recommendation: heart healthy  Filed Weights   02/21/21 0440  Weight: 75.8 kg    History of present illness:  [name] [name] 75 y.o.female, with PMH ofRA, moderate persistent asthma, chronic cough, GERD, vocal cord dysfunction, allergic rhinitiswho presented to the ER on 3/19/2022with dyspnea and findings concerning with asthma exacerbation. Patient states over the past 2 days she has had worsening, progressive dyspnea, wheezing, and cough. She has been compliant with her home inhaler regiment, Breztri BID,and nebulizer as needed. She has tried her nebulizer without improvement. She is currently out of her rescue inhaler, but does not routinely usethis. Denies any sick contacts or fever. She says her last asthma exacerbation was over 76 year ago. Has not been on steroids since then. She has never been intubated for her asthma. Does not use oxygen at home. Non-smoker. No recent changes in medication. In the ED, vital signs fairly stable, Covid negative, chest x-ray with no acute changes, labs fairly stable.  Patient with significant wheezing, was given Solu-Medrol, magnesium, albuterol in the ED without any improvement.  Patient admitted for further management.  She was admitted for  an exacerbation of asthma/copd overlap syndrome.  She's improved with steroids, abx, and nebs.  Plan for discharge today on 3/23 with outpt follow up.  See below for additional details  Hospital Course:   Acute asthma exacerbation History of moderate persistent asthma/vocal cord dysfunction/allergic rhinitis Currently on room air - appears comfortable, still with bilateral wheezing - was able to walk 200 ft with RN satting 98-99% (noted to RN she was slightly SOB, but not outside of normal Chest x-ray unremarkable D dimer wnl  D/c with steroid taper, abx, continue home nebs  History of prediabetes History of hyperglycemia associated with steroid use Last A1c 5.8 SSI, Accu-Cheks, hypoglycemic protocol  Essential thrombocythemia, sickle cell trait Continue folic acid, aspirin, hydroxyurea  GERD Continue PPI  Hyperlipidemia Continue statins  Obesity Lifestyle modification advised    Estimated body mass index is 30.58 kg/m as calculated from the following:   Height as of this encounter: 5\' 2"  (1.575 m).   Weight as of this encounter: 75.8 kg.   Procedures:  none  Consultations:  none  Discharge Exam: Vitals:   02/24/21 0535 02/24/21 0847  BP: 133/84   Pulse: 72   Resp: 14   Temp: 98.2 F (36.8 C)   SpO2: 97% 98%   No new complaints Still SOB with exertion, but eager to go home - comfortable d/c home - lives home with daughter and son  General: No acute distress. Cardiovascular: Heart sounds show [name] regular rate, and rhythm. Lungs: diffuse bilateral wheezing, appears comfortable despite this at rest Abdomen: Soft, nontender, nondistended Neurological: Alert and oriented 3. Moves all extremities 4 . Cranial nerves II through XII grossly intact. Skin: Warm and dry. No rashes or lesions. Extremities: No clubbing or  cyanosis. No edema.   Discharge Instructions   Discharge Instructions    Call MD for:  difficulty breathing, headache or visual  disturbances   Complete by: As directed    Call MD for:  extreme fatigue   Complete by: As directed    Call MD for:  hives   Complete by: As directed    Call MD for:  persistant dizziness or light-headedness   Complete by: As directed    Call MD for:  persistant nausea and vomiting   Complete by: As directed    Call MD for:  redness, tenderness, or signs of infection (pain, swelling, redness, odor or green/yellow discharge around incision site)   Complete by: As directed    Call MD for:  severe uncontrolled pain   Complete by: As directed    Call MD for:  temperature >100.4   Complete by: As directed    Diet - low sodium heart healthy   Complete by: As directed    Discharge instructions   Complete by: As directed    You were seen for [name] Asthma/COPD exacerbation.  You've improved with steroids and antibiotics.  We'll seen you home with [name] steroid taper and antibiotics to complete.  Please follow up with Dr. Lamonte [name] from pulmonology outpatient as soon as possible.  Follow up with your PCP as well.  Continue your home maintenance inhalers and your albuterol as needed.   Return for new, recurrent, or worsening symptoms.  Please ask your PCP to request records from this hospitalization so they know what was done and what the next steps will be.   Increase activity slowly   Complete by: As directed      Allergies as of 02/24/2021      Reactions   Pravachol [pravastatin] Other (See Comments)   Penicillins Itching, Rash   Did it involve swelling of the face/tongue/throat, SOB, or low BP? N Did it involve sudden or severe rash/hives, skin peeling, or any reaction on the inside of your mouth or nose? Y Did you need to seek medical attention at [facility] or doctor's office? N When did it last happen?Decades Ago If all above answers are "NO", may proceed with cephalosporin use.      Medication List    TAKE these medications   albuterol (2.5 MG/3ML) 0.083% nebulizer  solution Commonly known as: PROVENTIL Take 3 mLs (2.5 mg total) by nebulization every 4 (four) hours as needed for shortness of breath. Reported on 12/30/2015   albuterol 108 (90 Base) MCG/ACT inhaler Commonly known as: Proventil HFA Inhale 2 puffs into the lungs every 4 (four) hours as needed for wheezing or shortness of breath. Reported on 12/30/2015   aspirin EC 81 MG tablet Take 81 mg by mouth daily.   azithromycin 250 MG tablet Commonly known as: ZITHROMAX Take 2 tablets (500 mg total) by mouth daily for 3 days.   Breztri Aerosphere 160-9-4.8 MCG/ACT Aero Generic drug: Budeson-Glycopyrrol-Formoterol Inhale 2 puffs into the lungs in the morning and at bedtime.   cholecalciferol 1000 units tablet Commonly known as: VITAMIN D Take 1,000 Units by mouth daily.   fluticasone 50 MCG/ACT nasal spray Commonly known as: FLONASE Place 1 spray into the nose 2 (two) times daily.   folic acid 1 MG tablet Commonly known as: FOLVITE Take 1 tablet (1 mg total) by mouth daily.   gabapentin 100 MG capsule Commonly known as: NEURONTIN Take 100 mg by mouth at bedtime.   hydroxyurea 500 MG capsule Commonly  known as: HYDREA Take 1 capsule (500 mg total) by mouth 3 (three) times daily before meals.   Magnesium 200 MG Tabs Take 1 tablet by mouth 2 (two) times daily.   montelukast 10 MG tablet Commonly known as: SINGULAIR Take 1 tablet (10 mg total) by mouth at bedtime. What changed: See the new instructions.   ofloxacin 0.3 % ophthalmic solution Commonly known as: OCUFLOX Place 1 drop into the right eye in the morning and at bedtime.   pantoprazole 40 MG tablet Commonly known as: PROTONIX Take 1 tablet (40 mg total) by mouth 2 (two) times daily. Take 30- 60 min before your first and last meals of the day   prednisoLONE acetate 1 % ophthalmic suspension Commonly known as: PRED FORTE Place 1 drop into the right eye in the morning and at bedtime.   predniSONE 10 MG tablet Commonly  known as: DELTASONE Take 4 tablets (40 mg total) by mouth daily with breakfast for 2 days, THEN 3 tablets (30 mg total) daily with breakfast for 2 days, THEN 2 tablets (20 mg total) daily with breakfast for 2 days, THEN 1 tablet (10 mg total) daily with breakfast for 2 days. Start taking on: February 25, 2021   promethazine-dextromethorphan 6.25-15 MG/5ML syrup Commonly known as: PROMETHAZINE-DM Take 5 mLs by mouth 4 (four) times daily as needed for cough.   rosuvastatin 10 MG tablet Commonly known as: CRESTOR Take 10 mg by mouth at bedtime.      Allergies  Allergen Reactions  . Pravachol [Pravastatin] Other (See Comments)  . Penicillins Itching and Rash    Did it involve swelling of the face/tongue/throat, SOB, or low BP? N Did it involve sudden or severe rash/hives, skin peeling, or any reaction on the inside of your mouth or nose? Y Did you need to seek medical attention at [facility] or doctor's office? N When did it last happen?Decades Ago If all above answers are "NO", may proceed with cephalosporin use.        The results of significant diagnostics from this hospitalization (including imaging, microbiology, ancillary and laboratory) are listed below for reference.    Significant Diagnostic Studies: DG Chest Port 1 View  Result Date: 02/20/2021 CLINICAL DATA:  Shortness of breath EXAM: PORTABLE CHEST 1 VIEW COMPARISON:  07/06/2020 FINDINGS: Heart and mediastinal contours are within normal limits. No focal opacities or effusions. No acute bony abnormality. Small hiatal hernia. IMPRESSION: No active disease. Electronically Signed   By: Rolm Baptise M.D.   On: 02/20/2021 20:29    Microbiology: Recent Results (from the past 240 hour(s))  Resp Panel by RT-PCR (Flu Shalev Helminiak&B, Covid) Nasopharyngeal Swab     Status: None   Collection Time: 02/20/21 10:19 PM   Specimen: Nasopharyngeal Swab; Nasopharyngeal(NP) swabs in vial transport medium  Result Value Ref Range Status   SARS  Coronavirus 2 by RT PCR NEGATIVE NEGATIVE Final    Comment: (NOTE) SARS-CoV-2 target nucleic acids are NOT DETECTED.  The SARS-CoV-2 RNA is generally detectable in upper respiratory specimens during the acute phase of infection. The lowest concentration of SARS-CoV-2 viral copies this assay can detect is 138 copies/mL. Ayan Heffington negative result does not preclude SARS-Cov-2 infection and should not be used as the sole basis for treatment or other patient management decisions. Delfino Friesen negative result may occur with  improper specimen collection/handling, submission of specimen other than nasopharyngeal swab, presence of viral mutation(s) within the areas targeted by this assay, and inadequate number of viral copies(<138 copies/mL). Delylah Stanczyk negative  result must be combined with clinical observations, patient history, and epidemiological information. The expected result is Negative.  Fact Sheet for Patients:  EntrepreneurPulse.com.au  Fact Sheet for Healthcare Providers:  IncredibleEmployment.be  This test is no t yet approved or cleared by the [place] FDA and  has been authorized for detection and/or diagnosis of SARS-CoV-2 by FDA under an Emergency Use Authorization (EUA). This EUA will remain  in effect (meaning this test can be used) for the duration of the COVID-19 declaration under Section 564(b)(1) of the Act, 21 U.S.C.section 360bbb-3(b)(1), unless the authorization is terminated  or revoked sooner.       Influenza Mida Cory by PCR NEGATIVE NEGATIVE Final   Influenza B by PCR NEGATIVE NEGATIVE Final    Comment: (NOTE) The Xpert Xpress SARS-CoV-2/FLU/RSV plus assay is intended as an aid in the diagnosis of influenza from Nasopharyngeal swab specimens and should not be used as Hyla Coard sole basis for treatment. Nasal washings and aspirates are unacceptable for Xpert Xpress SARS-CoV-2/FLU/RSV testing.  Fact Sheet for  Patients: EntrepreneurPulse.com.au  Fact Sheet for Healthcare Providers: IncredibleEmployment.be  This test is not yet approved or cleared by the [place] FDA and has been authorized for detection and/or diagnosis of SARS-CoV-2 by FDA under an Emergency Use Authorization (EUA). This EUA will remain in effect (meaning this test can be used) for the duration of the COVID-19 declaration under Section 564(b)(1) of the Act, 21 U.S.C. section 360bbb-3(b)(1), unless the authorization is terminated or revoked.  Performed at [facility], [place] [address], [place], [place] [address]      Labs: Basic Metabolic Panel: Recent Labs  Lab 02/20/21 2045 02/21/21 0512 02/22/21 0550 02/23/21 0616 02/24/21 0530  NA 137 139 137 140 137  K 4.0 4.0 4.2 4.3 4.2  CL 104 103 104 105 101  CO2 22 25 25 26 26   GLUCOSE 200* 170* 150* 138* 133*  BUN 15 17 23 20  30*  CREATININE 1.03* 0.96 1.03* 0.98 1.01*  CALCIUM 9.3 9.1 8.9 9.3 9.0  MG 2.3  --   --   --   --   PHOS 2.1*  --   --   --   --    Liver Function Tests: Recent Labs  Lab 02/21/21 0512  AST 23  ALT 24  ALKPHOS 54  BILITOT 0.5  PROT 6.4*  ALBUMIN 3.5   No results for input(s): LIPASE, AMYLASE in the last 168 hours. No results for input(s): AMMONIA in the last 168 hours. CBC: Recent Labs  Lab 02/20/21 2045 02/21/21 0512 02/22/21 0550 02/23/21 0616 02/24/21 0530  WBC 9.3 7.9 8.3 9.1 9.0  NEUTROABS  --  6.9 6.9 7.4 6.9  HGB 12.1 11.9* 11.5* 12.2 12.2  HCT 38.1 37.3 35.2* 37.4 37.6  MCV 86.4 85.7 85.2 84.8 84.1  PLT 556* 518* 491* 551* 475*   Cardiac Enzymes: No results for input(s): CKTOTAL, CKMB, CKMBINDEX, TROPONINI in the last 168 hours. BNP: BNP (last 3 results) Recent Labs    07/06/20 1800  BNP 30.5    ProBNP (last 3 results) Recent Labs    06/16/20 0901  PROBNP 102    CBG: Recent Labs  Lab 02/23/21 0739 02/23/21 1202 02/23/21 1650  02/23/21 2049 02/24/21 0750  GLUCAP 115* 196* 135* 113* 117*       Signed:  Fayrene Helper MD.  [facility] 02/24/2021, 11:06 AM

## 2021-02-25 ENCOUNTER — Ambulatory Visit: Payer: Self-pay | Admitting: *Deleted

## 2021-02-25 ENCOUNTER — Other Ambulatory Visit: Payer: Self-pay

## 2021-02-25 DIAGNOSIS — J441 Chronic obstructive pulmonary disease with (acute) exacerbation: Secondary | ICD-10-CM

## 2021-02-25 DIAGNOSIS — J45901 Unspecified asthma with (acute) exacerbation: Secondary | ICD-10-CM

## 2021-03-01 ENCOUNTER — Other Ambulatory Visit: Payer: Self-pay | Admitting: *Deleted

## 2021-03-01 ENCOUNTER — Encounter: Payer: Self-pay | Admitting: *Deleted

## 2021-03-01 DIAGNOSIS — I700 Atherosclerosis of aorta: Secondary | ICD-10-CM | POA: Insufficient documentation

## 2021-03-01 DIAGNOSIS — M2550 Pain in unspecified joint: Secondary | ICD-10-CM | POA: Insufficient documentation

## 2021-03-01 DIAGNOSIS — J4520 Mild intermittent asthma, uncomplicated: Secondary | ICD-10-CM | POA: Insufficient documentation

## 2021-03-01 DIAGNOSIS — M069 Rheumatoid arthritis, unspecified: Secondary | ICD-10-CM | POA: Insufficient documentation

## 2021-03-01 DIAGNOSIS — R252 Cramp and spasm: Secondary | ICD-10-CM | POA: Insufficient documentation

## 2021-03-01 DIAGNOSIS — Z780 Asymptomatic menopausal state: Secondary | ICD-10-CM | POA: Insufficient documentation

## 2021-03-01 DIAGNOSIS — M059 Rheumatoid arthritis with rheumatoid factor, unspecified: Secondary | ICD-10-CM | POA: Insufficient documentation

## 2021-03-01 DIAGNOSIS — D72819 Decreased white blood cell count, unspecified: Secondary | ICD-10-CM | POA: Insufficient documentation

## 2021-03-01 DIAGNOSIS — E669 Obesity, unspecified: Secondary | ICD-10-CM | POA: Insufficient documentation

## 2021-03-01 DIAGNOSIS — F322 Major depressive disorder, single episode, severe without psychotic features: Secondary | ICD-10-CM | POA: Insufficient documentation

## 2021-03-01 NOTE — Patient Outreach (Signed)
[facility]) Care Management  03/01/2021  [name] 16-Sep-1946 [id number]  [facility] Telephone Assessment/Screen for post hospital/snf/complex care referral  Referral Date: 02/25/21 Referral Source: [facility] hospital liaison Referral Reason: for post hospital/complex care services Pt previously with [facility] disease management J Wine prior to admission Insurance: united healthcare medicare   admission 02/20/21 to 02/24/21 asthma exacerbation    Transition of care services noted to be completed by primary care MD office staff- Dr Ashby Dawes, [name] Transition of Care will be completed by primary care provider office who will refer to [facility] care management if needed.  Outreach attempt #1 successful at [phone] Patient is able to verify HIPAA, DOB and address Reviewed and addressed referral to [facility] with patient Consent: [facility] RN CM reviewed [facility] services with patient. Patient gave verbal consent for services [facility] telephonic RN CM. Discussed with patient [facility] telephonic outreach with goal to return to [facility] disease management staff She voiced understanding    Assessment Reviewed discharge instructions Pt was not able to get antibiotic but got prednisone Denies shortness of breath (sob) and reports she feels much better Noted with audible congested cough during this outreach Confirms use of her nebulizer   Appointments Does not have a primary care provider (PCP) follow up EPIC indicates last visit in February 2022  She was outreached by pcp office today She has outreached to her pulmonology office and will be able to see Tammy, NP/PA on the 03/12/21  Finances- Reports finance concerns and she receives a check " on the fourth wednesdays"  Patient goal interventions Take medicines as ordered Wear mask when in contact with others  [facility] RN CM care coordination Check with pulmonology for possible antibiotic samples  [facility] RN CM disease management/education    importance of taking medications ordered and follow up with primary care provider (PCP)  Social: Lives with daughter and son [name] is retired, widowed. She has support of her daughters, family, pastor and church family. She continues to drive and is independent with all her care needs  She is on social security fixed income  Reports financial concerns Attends her church services  Patient Active Problem List   Diagnosis Date Noted  . Acute asthma exacerbation 02/22/2021  . Asthma exacerbation 02/21/2021  . Hyperglycemia 02/21/2021  . Asthma-COPD overlap syndrome ([place]) 07/08/2020  . On methotrexate therapy 07/10/2018  . Abnormal laboratory test 03/29/2018  . Sickle cell trait ([place]) 01/20/2017  . JAK-2 gene mutation 12/06/2016  . AKI (acute kidney injury) ([place]) 11/24/2016  . Right shoulder pain 11/24/2016  . Acute respiratory distress 11/24/2016  . RLS (restless legs syndrome) 11/24/2016  . Restless leg syndrome 10/13/2016  . Arthritis of knee, degenerative 02/18/2015  . Shingles 12/19/2014  . ILD (interstitial lung disease) ([place])   . Atelectasis   . Essential thrombocythemia ([place]) 12/24/2013  . History of arthroscopy of knee 11/11/2013  . TIA (transient ischemic attack) 09/01/2012  . Chest pain 09/01/2012  . Arm numbness left 06/03/2012  . Migraine 05/31/2012  . Dyspnea on exertion 08/09/2011  . DDD (degenerative disc disease), lumbar 07/20/2011  . DJD (degenerative joint disease), lumbar 07/20/2011  . CVA (cerebral infarction) 07/20/2011  . Acute lumbar radiculopathy 07/12/2011  . Preventative health care 07/10/2011  . TRIGGER FINGER, RIGHT MIDDLE 01/11/2011  . Other dysphagia 01/11/2011  . MENOPAUSAL DISORDER 07/05/2010  . OSTEOARTHRITIS, KNEES, BILATERAL 07/05/2010  . NEVUS 05/20/2010  . VAGINITIS 03/23/2010  . SUPERFICIAL THROMBOPHLEBITIS 03/20/2009  . FATIGUE 03/20/2009  .  DYSPHAGIA UNSPECIFIED 02/16/2009  . Snoring 11/05/2008  . HERPES ZOSTER 06/30/2008  .  Diabetes ([place]) 06/30/2008  . Hyperlipidemia 06/30/2008  . ESOPHAGEAL STRICTURE 06/30/2008  . FOOT PAIN, RIGHT 06/30/2008  . OSTEOPOROSIS 06/30/2008  . COLONIC POLYPS, HX OF 06/30/2008  . PANIC ATTACK 09/05/2007  . Allergic rhinitis 09/05/2007  . Vocal cord dysfunction 09/05/2007  . Intrinsic asthma 09/05/2007  . GERD (gastroesophageal reflux disease) 09/05/2007     DME: nebulizer  Plan: Patient agrees to the care plan and follow up within the next 7-14 business days Pt encouraged to return a call to [facility] RN CM prn  [name] Giglia L. [name], RN, BSN, [place] Coordinator Office number [phone] Mobile number [phone]  Main [facility] number [phone] Fax number [phone]

## 2021-03-02 ENCOUNTER — Other Ambulatory Visit: Payer: Self-pay | Admitting: *Deleted

## 2021-03-02 NOTE — Patient Outreach (Signed)
[facility]) Care Management  03/02/2021  CHENITA RUDA 1945/12/29 [id number]   [facility] Care coordination- antibiotic  Follow from pulmonology NP/PA related to unfilled antibiotic Unable to assist at this time but will address with patient at upcoming appointment   Outreach to primary care provider (PCP) office [phone] Spoke with pcp staff, Kieth Brightly about pt not being able to obtain ordered discharge antibiotic Kieth Brightly took a message to give to nurse Laurell Roof confirms pt has a scheduled Apr 08 2021 pcp appointment and a 03/11/21 medication management appointment  Reviewed with Kieth Brightly for the note to the nurse that on the 03/01/21 outreach pt stated she did not fill Zithromax 250 mg as was ordered upon hospital discharge to take 2 tablets (500 mg total) by mouth daily for 3 day. Reported pt states not filled related to not having money. [facility] RN CM inquiring if any possible assistance from pcp   Plan: Patient agrees to the care plan and follow up within the next 7-14 business days Pt encouraged to return a call to [place] CM prn Goals      Patient Stated   .  [facility]) Manage My Medicine (pt-stated)      Timeframe:  Short-Term Goal Priority:  High Start Date:            03/01/21                 Expected End Date:          04/02/21             Follow Up Date 03/12/21  - call for medicine refill 2 or 3 days before it runs out     Notes: 03/02/21 [facility] RN CM response from pulmonologist and outreach to pcp nurse related to Zithromax not filled by pt. 03/01/21 states having financial concern with filling zithromax. - discuss outreach to MDs     .  [facility]) Track and Manage My Symptoms-Asthma (pt-stated)      Timeframe:  Short-Term Goal Priority:  High Start Date:                      03/01/21       Expected End Date:           04/02/21            Follow Up Date 03/13/21   - follow asthma action plan - keep rescue medicines on hand     Notes: 03/01/21 has pulmonology appointment and  hs prednisone       Arleth Mccullar L. [name], RN, BSN, [place] Coordinator Office number [phone] Mobile number [phone]  Main [facility] number [phone] Fax number [phone]

## 2021-03-04 DIAGNOSIS — K219 Gastro-esophageal reflux disease without esophagitis: Secondary | ICD-10-CM | POA: Diagnosis not present

## 2021-03-04 DIAGNOSIS — H2512 Age-related nuclear cataract, left eye: Secondary | ICD-10-CM | POA: Diagnosis not present

## 2021-03-04 DIAGNOSIS — H25812 Combined forms of age-related cataract, left eye: Secondary | ICD-10-CM | POA: Diagnosis not present

## 2021-03-04 DIAGNOSIS — J4541 Moderate persistent asthma with (acute) exacerbation: Secondary | ICD-10-CM | POA: Diagnosis not present

## 2021-03-04 DIAGNOSIS — E782 Mixed hyperlipidemia: Secondary | ICD-10-CM | POA: Diagnosis not present

## 2021-03-04 DIAGNOSIS — J45909 Unspecified asthma, uncomplicated: Secondary | ICD-10-CM | POA: Diagnosis not present

## 2021-03-11 ENCOUNTER — Telehealth: Payer: Self-pay | Admitting: Adult Health

## 2021-03-11 DIAGNOSIS — J45909 Unspecified asthma, uncomplicated: Secondary | ICD-10-CM

## 2021-03-11 MED ORDER — MONTELUKAST SODIUM 10 MG PO TABS: 10.0000 mg | ORAL_TABLET | Freq: Every day | ORAL | 3 refills | Status: AC

## 2021-03-11 MED ORDER — PANTOPRAZOLE SODIUM 40 MG PO TBEC: 40.0000 mg | DELAYED_RELEASE_TABLET | Freq: Two times a day (BID) | ORAL | 3 refills | Status: DC

## 2021-03-11 NOTE — Telephone Encounter (Signed)
Called and spoke with Ubaldo Glassing from [employer] to verify medications and quantity. Also called patient to verify that this accurate and that she was ok with me sending in order. She verified. 2 prescriptions have been sent to [facility] has been added to her preferred pharmacy list. Nothing further needed at this time.

## 2021-03-12 ENCOUNTER — Encounter: Payer: Self-pay | Admitting: Adult Health

## 2021-03-12 ENCOUNTER — Other Ambulatory Visit: Payer: Self-pay | Admitting: *Deleted

## 2021-03-12 ENCOUNTER — Other Ambulatory Visit: Payer: Self-pay

## 2021-03-12 ENCOUNTER — Ambulatory Visit (INDEPENDENT_AMBULATORY_CARE_PROVIDER_SITE_OTHER): Payer: Medicare Other | Admitting: Adult Health

## 2021-03-12 VITALS — BP 110/66 | HR 88 | Temp 97.6°F | Ht 62.0 in | Wt 167.6 lb

## 2021-03-12 DIAGNOSIS — J309 Allergic rhinitis, unspecified: Secondary | ICD-10-CM | POA: Diagnosis not present

## 2021-03-12 DIAGNOSIS — J849 Interstitial pulmonary disease, unspecified: Secondary | ICD-10-CM

## 2021-03-12 DIAGNOSIS — J449 Chronic obstructive pulmonary disease, unspecified: Secondary | ICD-10-CM

## 2021-03-12 DIAGNOSIS — K219 Gastro-esophageal reflux disease without esophagitis: Secondary | ICD-10-CM

## 2021-03-12 MED ORDER — BUDESONIDE 0.25 MG/2ML IN SUSP: 0.2500 mg | Freq: Two times a day (BID) | RESPIRATORY_TRACT | 1 refills | Status: DC

## 2021-03-12 MED ORDER — ARFORMOTEROL TARTRATE 15 MCG/2ML IN NEBU: 15.0000 ug | INHALATION_SOLUTION | Freq: Two times a day (BID) | RESPIRATORY_TRACT | 1 refills | Status: DC

## 2021-03-12 MED ORDER — BREZTRI AEROSPHERE 160-9-4.8 MCG/ACT IN AERO: INHALATION_SPRAY | RESPIRATORY_TRACT | 0 refills | Status: DC

## 2021-03-12 MED ORDER — REVEFENACIN 175 MCG/3ML IN SOLN: 175.0000 ug | Freq: Every day | RESPIRATORY_TRACT | 1 refills | Status: DC

## 2021-03-12 NOTE — Progress Notes (Signed)
@Patient  ID: [name], female    DOB: 28-Nov-1946, 75 y.o.   MRN: [id number]  Chief Complaint  Patient presents with  . Follow-up    Referring provider: Merrilee Seashore, MD  HPI: 75 year old female former smoker followed for asthma, vocal cord dysfunction complicated by GERD and allergic rhinitis Medical history significant for esophageal stricture and essential thrombocytosis (on Hydroxyurea)  Follows with Rheumatology followed for Autoimmune -possible AR previously on Methotrexate  Covid infection January 2021   TEST/EVENTS :  PFT March 2018 showed FEV1 86%, ratio 61, FVC 110% significant positive bronchodilator response , DLCO 90% (prebronchodilator FEV1 65%, ratio 56, FVC 89%)  PFTs May 2021 showed severe airflow obstruction with FEV1 at 57%, ratio 50, FVC 88%, DLCO 83%, no significant bronchodilator response  CBC 05/19/2020-WBC 6, eos 1%, absolute eosinophil count 60 ANA + 1:40 , cytoplasmic  HSP neg , AntiSleroderma ab, ANCA , SS ab, MyoMarker profile neg  Alpha 1 nml  IgE 06/12/2017-9   ILD workup 06/2020 -  -CT chest -few patchy areas of peripheral predominant groundglass attenuation, septal thickening and mild subpleural reticulation most evident in the upper lobes.  Mild emphysema.    03/12/2021 Follow up : Asthma, Post hospital  Patient presents for a post hospital follow-up.  Patient was recently admitted the end of last month for a asthmatic bronchitic exacerbation.  Patient presented with increased shortness of breath cough and wheezing.  She was treated with IV steroids and nebulized bronchodilators. She was treated with empiric antibiotics.  Her D-dimer was normal.  She was discharged on a steroid taper. Since discharge patient says she is feeling better.  Her cough and wheezing have decreased.  Patient does admit that she is unable to afford her inhalers.  She has tried to get patient assistance without approval in the past.  Currently is using samples but  runs out frequently.  She does have a nebulizer at home.  She denies any hemoptysis, chest pain, orthopnea or edema..   Allergies  Allergen Reactions  . Penicillin G     Other reaction(s): rash  . Pravachol [Pravastatin] Other (See Comments)  . Penicillins Itching and Rash    Did it involve swelling of the face/tongue/throat, SOB, or low BP? N Did it involve sudden or severe rash/hives, skin peeling, or any reaction on the inside of your mouth or nose? Y Did you need to seek medical attention at a hospital or doctor's office? N When did it last happen?Decades Ago If all above answers are "NO", may proceed with cephalosporin use.      Immunization History  Administered Date(s) Administered  . DTaP 03/20/2009  . Fluad Quad(high Dose 65+) 09/16/2019  . Influenza Split 11/10/2011, 10/10/2012, 09/17/2018  . Influenza Whole 10/19/2006, 09/03/2008, 10/05/2009, 09/07/2010  . Influenza, High Dose Seasonal PF 08/27/2014, 09/15/2016, 09/05/2017  . Influenza, Quadrivalent, Recombinant, Inj, Pf 08/21/2017, 09/17/2018, 09/16/2019, 08/27/2020  . Influenza,inj,Quad PF,6+ Mos 09/11/2013, 08/08/2014, 09/14/2015, 09/05/2016  . Influenza-Unspecified 10/05/2018  . Moderna SARS-COV2 Booster Vaccination 09/29/2020  . Moderna Sars-Covid-2 Vaccination 01/27/2020, 02/25/2020  . Pneumococcal Conjugate-13 11/05/2014  . Pneumococcal Polysaccharide-23 03/01/2006, 12/29/2016  . Td 03/20/2009  . Zoster 10/19/2006    Past Medical History:  Diagnosis Date  . Allergic rhinitis   . Anemia   . Asthma   . Chronic bronchitis ([place])   . Colonic polyp   . DDD (degenerative disc disease), lumbar 07/20/2011  . DJD (degenerative joint disease) of knee    bilateral  . DJD (degenerative joint  disease), lumbar 07/20/2011  . Esophageal stricture   . GERD (gastroesophageal reflux disease)   . Hiatal hernia   . Hyperlipidemia   . Osteoporosis   . Stroke [facility])    "didn't know I'd had one before I was told;  didn't do any damage" (11/24/2016)  . Vocal cord dysfunction     Tobacco History: Social History   Tobacco Use  Smoking Status Former Smoker  . Packs/day: 0.10  . Years: 30.00  . Pack years: 3.00  . Types: Cigarettes  . Start date: 01/31/1975  . Quit date: 08/09/1995  . Years since quitting: 25.6  Smokeless Tobacco Never Used   Counseling given: Not Answered   Outpatient Medications Prior to Visit  Medication Sig Dispense Refill  . albuterol (PROVENTIL) (2.5 MG/3ML) 0.083% nebulizer solution Take 3 mLs (2.5 mg total) by nebulization every 4 (four) hours as needed for shortness of breath. 300 mL 3  . aspirin EC 81 MG tablet Take 81 mg by mouth daily.    . Budeson-Glycopyrrol-Formoterol (BREZTRI AEROSPHERE) 160-9-4.8 MCG/ACT AERO Inhale 2 puffs into the lungs in the morning and at bedtime. 10.7 g 11  . Calcium Carb-Cholecalciferol 600-200 MG-UNIT TABS     . cholecalciferol (VITAMIN D) 1000 UNITS tablet Take 1,000 Units by mouth daily.    . fluticasone (FLONASE) 50 MCG/ACT nasal spray Place 1 spray into the nose 2 (two) times daily.    . folic acid (FOLVITE) 1 MG tablet Take 1 tablet (1 mg total) by mouth daily. 90 tablet 3  . gabapentin (NEURONTIN) 100 MG capsule Take 100 mg by mouth at bedtime.    . hydroxyurea (HYDREA) 500 MG capsule Take 1 capsule (500 mg total) by mouth 3 (three) times daily before meals. 90 capsule 6  . Magnesium 200 MG TABS Take 1 tablet by mouth 2 (two) times daily.     . methotrexate 2.5 MG tablet 6 tabs    . montelukast (SINGULAIR) 10 MG tablet Take 1 tablet (10 mg total) by mouth at bedtime. 90 tablet 3  . ofloxacin (OCUFLOX) 0.3 % ophthalmic solution Place 1 drop into the right eye in the morning and at bedtime.    Marland Kitchen omeprazole (PRILOSEC) 20 MG capsule Take 1 capsule by mouth 2 (two) times daily.    . pantoprazole (PROTONIX) 40 MG tablet Take 1 tablet (40 mg total) by mouth 2 (two) times daily. Take 30- 60 min before your first and last meals of the day 180  tablet 3  . prednisoLONE acetate (PRED FORTE) 1 % ophthalmic suspension Place 1 drop into the right eye in the morning and at bedtime.    . promethazine-dextromethorphan (PROMETHAZINE-DM) 6.25-15 MG/5ML syrup Take 5 mLs by mouth 4 (four) times daily as needed for cough. 240 mL 0  . rosuvastatin (CRESTOR) 10 MG tablet Take 10 mg by mouth at bedtime.    . sertraline (ZOLOFT) 50 MG tablet 1 tablet    . albuterol (PROVENTIL HFA) 108 (90 Base) MCG/ACT inhaler Inhale 2 puffs into the lungs every 4 (four) hours as needed for wheezing or shortness of breath. Reported on 12/30/2015 (Patient not taking: Reported on 03/12/2021) 8 g 1  . budesonide-formoterol (SYMBICORT) 160-4.5 MCG/ACT inhaler 2 puffs (Patient not taking: Reported on 03/12/2021)     Facility-Administered Medications Prior to Visit  Medication Dose Route Frequency Provider Last Rate Last Admin  . 0.9 %  sodium chloride infusion  500 mL Intravenous Once Irene Shipper, MD  Review of Systems:   Constitutional:   No  weight loss, night sweats,  Fevers, chills, + fatigue, or  lassitude.  HEENT:   No headaches,  Difficulty swallowing,  Tooth/dental problems, or  Sore throat,                No sneezing, itching, ear ache,  +nasal congestion, post nasal drip,   CV:  No chest pain,  Orthopnea, PND, swelling in lower extremities, anasarca, dizziness, palpitations, syncope.   GI  No heartburn, indigestion, abdominal pain, nausea, vomiting, diarrhea, change in bowel habits, loss of appetite, bloody stools.   Resp:    No chest wall deformity  Skin: no rash or lesions.  GU: no dysuria, change in color of urine, no urgency or frequency.  No flank pain, no hematuria   MS:  No joint pain or swelling.  No decreased range of motion.  No back pain.    Physical Exam  BP 110/66 (BP Location: Left Arm, Cuff Size: Normal)   Pulse 88   Temp 97.6 F (36.4 C) (Temporal)   Ht 5\' 2"  (1.575 m)   Wt 167 lb 9.6 oz (76 kg)   SpO2 100% Comment: RA   BMI 30.65 kg/m   GEN: A/Ox3; pleasant , NAD, well nourished    HEENT:  Daniel/AT,   , NOSE-clear, THROAT-clear, no lesions, no postnasal drip or exudate noted.   NECK:  Supple w/ fair ROM; no JVD; normal carotid impulses w/o bruits; no thyromegaly or nodules palpated; no lymphadenopathy.    RESP few expiratory wheezes.  Speaks in full sentences , w/o, wheezes/ rales/ or rhonchi. no accessory muscle use, no dullness to percussion  CARD:  RRR, no m/r/g, no peripheral edema, pulses intact, no cyanosis or clubbing.  GI:   Soft & nt; nml bowel sounds; no organomegaly or masses detected.   Musco: Warm bil, no deformities or joint swelling noted.   Neuro: alert, no focal deficits noted.    Skin: Warm, no lesions or rashes    Lab Results:   BMET  BNP  Imaging: DG Chest Port 1 View  Result Date: 02/20/2021 CLINICAL DATA:  Shortness of breath EXAM: PORTABLE CHEST 1 VIEW COMPARISON:  07/06/2020 FINDINGS: Heart and mediastinal contours are within normal limits. No focal opacities or effusions. No acute bony abnormality. Small hiatal hernia. IMPRESSION: No active disease. Electronically Signed   By: Rolm Baptise M.D.   On: 02/20/2021 20:29      PFT Results Latest Ref Rng & Units 04/24/2020 02/03/2017  FVC-Pre L 1.89 1.96  FVC-Predicted Pre % 88 89  FVC-Post L 1.76 2.44  FVC-Predicted Post % 82 110  Pre FEV1/FVC % % 50 56  Post FEV1/FCV % % 51 61  FEV1-Pre L 0.94 1.11  FEV1-Predicted Pre % 57 65  FEV1-Post L 0.90 1.48  DLCO uncorrected ml/min/mmHg 15.44 20.71  DLCO UNC% % 83 90  DLCO corrected ml/min/mmHg 15.44 19.42  DLCO COR %Predicted % 83 84  DLVA Predicted % 139 114  TLC L 4.67 6.30  TLC % Predicted % 95 128  RV % Predicted % 124 199    No results found for: NITRICOXIDE      Assessment & Plan:   Asthma-COPD overlap syndrome (HCC) Recent exacerbation requiring hospitalization.  Patient is improving.  Suspect her inability to afford her maintenance inhalers is  contributing to recent exacerbation and ongoing symptoms. Patient was given 2 samples of her triple therapy inhaler. Will send an order to local homecare  company to see if nebulized medications will be more affordable.  We will begin budesonide and Brovana twice daily and try Yupelri daily.  This will be filed through DME on Medicare B. If unable to afford nebulizers on return visit would consider changing her to [facility] which may be more affordable and adding DuoNeb 3 times daily. Continue on trigger prevention  Plan  Patient Instructions  Continue on BREZTRI 2 puffs Twice daily  , rinse after use. (samples x 2 given)   Send order for new nebs : Budesonide and Brovana Nebs Twice daily  And [employer] daily .  Albuterol inhaler or neb As needed   Mucinex DM Twice daily  As needed  Cough/congestion  Continue on Zyrtec 10mg  At bedtime  .  Continue on Singulair 10mg  daily .  Follow up with Dr. Lamonte Sakai  Or  Jestin Burbach NP in -6   weeks and As needed  Please contact office for sooner follow up if symptoms do not improve or worsen or seek emergency care        Gastroesophageal reflux disease without esophagitis Continue on GERD therapy and diet  Allergic rhinitis Continue on current regimen  ILD (interstitial lung disease) ([place]) Patient with mild interstitial changes on CT scan with upper lobe predominance. We will continue to follow.  Consider PFTs later on this year.      Rexene Edison, NP 03/12/2021

## 2021-03-12 NOTE — Assessment & Plan Note (Signed)
Patient with mild interstitial changes on CT scan with upper lobe predominance. We will continue to follow.  Consider PFTs later on this year.

## 2021-03-12 NOTE — Patient Instructions (Addendum)
Continue on BREZTRI 2 puffs Twice daily  , rinse after use. (samples x 2 given)   Send order for new nebs : Budesonide and Brovana Nebs Twice daily  And [employer] daily .  Albuterol inhaler or neb As needed   Mucinex DM Twice daily  As needed  Cough/congestion  Continue on Zyrtec 10mg  At bedtime  .  Continue on Singulair 10mg  daily .  Follow up with Dr. Lamonte Sakai  Or  Corbitt Cloke NP in -6   weeks and As needed  Please contact office for sooner follow up if symptoms do not improve or worsen or seek emergency care

## 2021-03-12 NOTE — Assessment & Plan Note (Signed)
Recent exacerbation requiring hospitalization.  Patient is improving.  Suspect her inability to afford her maintenance inhalers is contributing to recent exacerbation and ongoing symptoms. Patient was given 2 samples of her triple therapy inhaler. Will send an order to local homecare company to see if nebulized medications will be more affordable.  We will begin budesonide and Brovana twice daily and try Yupelri daily.  This will be filed through DME on Medicare B. If unable to afford nebulizers on return visit would consider changing her to [facility] which may be more affordable and adding DuoNeb 3 times daily. Continue on trigger prevention  Plan  Patient Instructions  Continue on BREZTRI 2 puffs Twice daily  , rinse after use. (samples x 2 given)   Send order for new nebs : Budesonide and Brovana Nebs Twice daily  And [employer] daily .  Albuterol inhaler or neb As needed   Mucinex DM Twice daily  As needed  Cough/congestion  Continue on Zyrtec 10mg  At bedtime  .  Continue on Singulair 10mg  daily .  Follow up with Dr. Lamonte Sakai  Or  Gyan Cambre NP in -6   weeks and As needed  Please contact office for sooner follow up if symptoms do not improve or worsen or seek emergency care

## 2021-03-12 NOTE — Assessment & Plan Note (Signed)
Continue on GERD therapy and diet

## 2021-03-12 NOTE — Assessment & Plan Note (Signed)
Continue on current regimen .   

## 2021-03-12 NOTE — Patient Outreach (Addendum)
[facility]) Care Management  03/12/2021  [name] Sep 30, 1946 [id number]  [facility] Telephone follow up for post hospital/complex care referral  Referral Date: 02/25/21 Referral Source: [facility] hospital liaison Referral Reason: for post hospital/complex care services Pt previously with [name] disease management [facility] prior to admission Insurance: united healthcare medicare AARP /blue cross and blue shield state PPO  admission 02/20/21 to 02/24/21 asthma exacerbation    Transition of care services noted to be completed by primary care MD office staff- [name], [facility] Transition of Care will be completed by primary care provider office who will refer to [name] care management if needed.  Outreach attempt #2 successful at [phone] Patient is able to verify HIPAA, DOB and address Reviewed and addressed referral to [facility] patient Consent: [facility] RN CM reviewed [name] services with patient. Patient gave verbal consent for services [name] telephonic RN CM.   Follow up outreach   Respiratory medicines [facility] outreach to pt on 03/11/21  She is not able to recall the name of the [facility] provider that called her to assist with medication   Asthma exacerbation Saw [name] pulmonology staff this morning 03/12/21 Former smoker followed for asthma, vocal cord dysfunction complicated by GERD and allergic rhinitis Medical history significant for esophageal stricture and essential thrombocytosis (on Hydroxyurea)  Follows with Rheumatology followed for Autoimmune -possible AR previously on Methotrexate  Covid infection January 2021 She is to continue with Breztri 2 puff bid - Samples were given x 2 in the office  new nebs : Budesonide and Brovana Nebs Twice daily  And [employer] daily .  Albuterol inhaler or neb As needed   Mucinex DM Twice daily  As needed  Cough/congestion  Continue on Zyrtec 10 mg At bedtime  .  Continue on Singulair 10 mg  daily .  Follow up with [name]  Or  [place] NP in -6   weeks and As needed  Pt denies questions or disease education from the office visit   Other medication concern  Folic acid is not available at her home She does not have bottle to read which MD may have ordered it for her She reports being on it a long time [name] RN CM is able to find in EPIC that [name] Roch [place],NP/PA started Folic acid in 02/1539 ([phone])  Follow up appointments  Go back to [name] primary care provider (PCP) on 03/23/21   [facility] RN CM care coordination  Conference calls completed with pt with attempt to resolve her folic acid concern  Outreach to pcp [phone]-- office closed at 1200 noon Outreach to [facility] [phone] option 5 spoke with Adonis Huguenin Katrina informs pt/RN that [facility] has not filled prescriptions for this pt yet and does not have folic acid on file Recommend pcp Online search was completed to assist patient with identifying that Folic acid could be purchased at her local pharmacy/discount store over the counter (OTC) for less than $4-5 She states she will purchase her folic acid   Eye surgery a few weeks ago completed (in March  2022)  Past Medical History:  Diagnosis Date  . Allergic rhinitis   . Anemia   . Asthma   . Chronic bronchitis ([place])   . Colonic polyp   . DDD (degenerative disc disease), lumbar 07/20/2011  . DJD (degenerative joint disease) of knee    bilateral  . DJD (degenerative joint disease), lumbar 07/20/2011  . Esophageal stricture   .  GERD (gastroesophageal reflux disease)   . Hiatal hernia   . Hyperlipidemia   . Osteoporosis   . Stroke [facility])    "didn't know I'd had one before I was told; didn't do any damage" (11/24/2016)  . Vocal cord dysfunction    Patient Active Problem List   Diagnosis Date Noted  . Cramp and spasm 03/01/2021  . Cramp in lower leg associated with rest 03/01/2021  . Hardening of the aorta (main artery of the heart) ([place])  03/01/2021  . Leukopenia 03/01/2021  . Menopause 03/01/2021  . Mild intermittent asthma 03/01/2021  . Multiple joint pain 03/01/2021  . Obesity 03/01/2021  . Seropositive rheumatoid arthritis ([place]) 03/01/2021  . Severe major depression, single episode, without psychotic features ([place]) 03/01/2021  . Moderate persistent asthma with (acute) exacerbation 02/22/2021  . Asthma exacerbation 02/21/2021  . Hyperglycemia 02/21/2021  . Asthma-COPD overlap syndrome ([place]) 07/08/2020  . On methotrexate therapy 07/10/2018  . Abnormal laboratory test 03/29/2018  . Sickle cell trait ([place]) 01/20/2017  . JAK-2 gene mutation 12/06/2016  . AKI (acute kidney injury) ([place]) 11/24/2016  . Right shoulder pain 11/24/2016  . Acute respiratory distress 11/24/2016  . RLS (restless legs syndrome) 11/24/2016  . Restless leg syndrome 10/13/2016  . Primary osteoarthritis of left knee 02/18/2015  . Shingles 12/19/2014  . ILD (interstitial lung disease) ([place])   . Atelectasis   . Essential thrombocythemia ([place]) 12/24/2013  . S/P left knee arthroscopy 11/11/2013  . TIA (transient ischemic attack) 09/01/2012  . Chest pain 09/01/2012  . Arm numbness left 06/03/2012  . Migraine 05/31/2012  . Dyspnea on exertion 08/09/2011  . DDD (degenerative disc disease), lumbar 07/20/2011  . DJD (degenerative joint disease), lumbar 07/20/2011  . CVA (cerebral infarction) 07/20/2011  . Syncope and collapse 07/12/2011  . Acute lumbar radiculopathy 07/12/2011  . Encounter for general adult medical examination without abnormal findings 07/10/2011  . TRIGGER FINGER, RIGHT MIDDLE 01/11/2011  . Other dysphagia 01/11/2011  . MENOPAUSAL DISORDER 07/05/2010  . OSTEOARTHRITIS, KNEES, BILATERAL 07/05/2010  . NEVUS 05/20/2010  . VAGINITIS 03/23/2010  . SUPERFICIAL THROMBOPHLEBITIS 03/20/2009  . FATIGUE 03/20/2009  . DYSPHAGIA UNSPECIFIED 02/16/2009  . Snoring 11/05/2008  . HERPES ZOSTER 06/30/2008  . Diabetes ([place]) 06/30/2008  .  Mixed hyperlipidemia 06/30/2008  . ESOPHAGEAL STRICTURE 06/30/2008  . FOOT PAIN, RIGHT 06/30/2008  . OSTEOPOROSIS 06/30/2008  . COLONIC POLYPS, HX OF 06/30/2008  . PANIC ATTACK 09/05/2007  . Allergic rhinitis 09/05/2007  . Vocal cord dysfunction 09/05/2007  . Intrinsic asthma 09/05/2007  . Gastroesophageal reflux disease without esophagitis 09/05/2007    Plans [facility] RN CM will outreach to pt within the next 14-21 business days Pt encouraged to return a call to [place] CM prn Goals      Patient Stated   .  [facility]) Manage My Medicine (pt-stated)      Timeframe:  Short-Term Goal Priority:  High Start Date:            03/01/21                 Expected End Date:          04/02/21             Follow Up Date 03/23/21  - call for medicine refill 2 or 3 days before it runs out     Notes: 03/12/21 pt inquiring about folic acid that is needed Will purchase folic acid for local discount store/pharmacy 03/02/21 [facility] RN CM response from pulmonologist and outreach  to pcp nurse related to Zithromax not filled by pt. 03/01/21 states having financial concern with filling Zithromax. - discuss outreach to MDs     .  [facility]) Track and Manage My Symptoms-Asthma (pt-stated)      Timeframe:  Short-Term Goal Priority:  High Start Date:                      03/01/21       Expected End Date:           04/02/21            Follow Up Date 03/23/21   - follow asthma action plan - keep rescue medicines on hand     Notes: 03/12/21 following asthma plan Had outreach from [facility] staff To go to purchase folic acid 08/04/50 has pulmonology appointment and hs prednisone          Brigette Hopfer L. [name], RN, BSN, [place] Coordinator Office number [phone] [facility] number [phone] Fax number [phone]

## 2021-03-23 ENCOUNTER — Other Ambulatory Visit: Payer: Self-pay

## 2021-03-23 ENCOUNTER — Other Ambulatory Visit: Payer: Self-pay | Admitting: *Deleted

## 2021-03-23 NOTE — Patient Outreach (Signed)
[facility]) Care Management  03/23/2021  [name] June 06, 1946 [id number]   [facility] Telephone follow up for post hospital/complex care referral  Referral Date:02/25/21 Referral Source:[facility] hospital liaison Referral Reason:for post hospital/complex care services Pt previously with [facility] disease management J Wine prior to admission Insurance:united healthcare medicareAARP /blue cross and blue shield state PPO  admission 02/20/21 to 02/24/21 asthma exacerbation  Patient is able to verify HIPAA, DOB and address Reviewed and addressed referral to [facility] patient Consent: [facility] RN CM reviewed [facility] services with patient. Patient gave verbal consent for services [facility] telephonic RN CM.   Follow up outreach  [name] reports she is doing well today  She denies any medical or care coordination needs  She received all her needed medicines (folic acids, inhalers and antibiotic) and did have an outreach from [place] to assist  She reports assistance from [employer], NP on 03/12/21 to get 2 samples of her  triple therapy inhaler. - Budesonide and Brovana twice daily and try Yupelri daily  $/8/22 NP note indicates is pt not able to afford inhalers on the return May 2022 visit, will consider changing her to wixela and Duoneb tid which may be more affordable   She reports she is awaiting her delayed home health nurse visit She reports this has inconvenience her   She also reports having a ill niece she is concerned about   pt had 03/23/21 incorrect date to see pcp on her calendar  Past Medical History:  Diagnosis Date  . Allergic rhinitis   . Anemia   . Asthma   . Chronic bronchitis ([place])   . Colonic polyp   . DDD (degenerative disc disease), lumbar 07/20/2011  . DJD (degenerative joint disease) of knee    bilateral  . DJD (degenerative joint disease), lumbar 07/20/2011  . Esophageal stricture   . GERD (gastroesophageal reflux disease)   . Hiatal hernia    . Hyperlipidemia   . Osteoporosis   . Stroke [facility])    "didn't know I'd had one before I was told; didn't do any damage" (11/24/2016)  . Vocal cord dysfunction     Plans Patient agrees to care plan and follow up within the next 30 business days Pt encouraged to return a call to [facility] RN CM prn Goals Addressed              This Visit's Progress     Patient Stated   .  [facility]) Manage My Medicine (pt-stated)   On track     Timeframe:  Short-Term Goal Priority:  High Start Date:            03/01/21                 Expected End Date:          04/02/21             Follow Up Date 03/23/21  - call for medicine refill 2 or 3 days before it runs out    Notes: 03/23/21 denies worsening symptoms Denies care coordination or medical needs awaiting [facility] RN visit, niece not doing well, reports upstream outreached, has inhalers and folic acid  05/06/59 pt inquiring about folic acid that is needed Will purchase folic acid for local discount store/pharmacy 03/02/21 [facility] RN CM response from pulmonologist and outreach to pcp nurse related to Zithromax not filled by pt. 03/01/21 states having financial concern with filling Zithromax. - discuss outreach to MDs     .  (  THN) Track and Manage My Symptoms-Asthma (pt-stated)   On track     Timeframe:  Short-Term Goal Priority:  High Start Date:                      03/01/21       Expected End Date:           04/02/21            Follow Up Date 03/23/21   - follow asthma action plan - keep rescue medicines on hand     Notes: 03/23/21 denies worsening symptoms Denies care coordination or medical needs awaiting [facility] RN visit, niece not doing well, reports upstream outreached, has inhalers and folic acid  06/11/57 following asthma plan Had outreach from upstream staff To go to purchase folic acid 8/50/27 has pulmonology appointment and hs prednisone        Verle Wheeling L. [name], RN, BSN, [place] Coordinator Office number [phone] Main [facility] number [phone] Fax number [phone]

## 2021-04-03 DIAGNOSIS — E782 Mixed hyperlipidemia: Secondary | ICD-10-CM | POA: Diagnosis not present

## 2021-04-03 DIAGNOSIS — K219 Gastro-esophageal reflux disease without esophagitis: Secondary | ICD-10-CM | POA: Diagnosis not present

## 2021-04-03 DIAGNOSIS — J45909 Unspecified asthma, uncomplicated: Secondary | ICD-10-CM | POA: Diagnosis not present

## 2021-04-03 DIAGNOSIS — J4541 Moderate persistent asthma with (acute) exacerbation: Secondary | ICD-10-CM | POA: Diagnosis not present

## 2021-04-08 DIAGNOSIS — D473 Essential (hemorrhagic) thrombocythemia: Secondary | ICD-10-CM | POA: Diagnosis not present

## 2021-04-08 DIAGNOSIS — E782 Mixed hyperlipidemia: Secondary | ICD-10-CM | POA: Diagnosis not present

## 2021-04-08 DIAGNOSIS — D649 Anemia, unspecified: Secondary | ICD-10-CM | POA: Diagnosis not present

## 2021-04-08 DIAGNOSIS — Z0000 Encounter for general adult medical examination without abnormal findings: Secondary | ICD-10-CM | POA: Diagnosis not present

## 2021-04-13 ENCOUNTER — Other Ambulatory Visit: Payer: Self-pay | Admitting: *Deleted

## 2021-04-13 DIAGNOSIS — J432 Centrilobular emphysema: Secondary | ICD-10-CM | POA: Insufficient documentation

## 2021-04-13 DIAGNOSIS — J8409 Other alveolar and parieto-alveolar conditions: Secondary | ICD-10-CM | POA: Insufficient documentation

## 2021-04-13 NOTE — Patient Outreach (Signed)
[facility]) Care Management  04/13/2021  [name] 12-29-1945 [id number]   [facility] Unsuccessful outreach  [name] was referred to [facility] on 02/25/21 by [facility] hospital liaison Referral Reason:for post hospital/complex care services Pt previously with [facility] disease management J Wine prior to admission  Insurance:united healthcare medicareAARP /blue cross and blue shield state PPO  [place] admission 02/20/21 to 02/24/21 asthma exacerbation   Outreach attempt to the home number  No answer. [facility] RN CM left HIPAA [facility] Portability and Accountability Act) compliant voicemail message along with CM's contact info.   Past Medical History:  Diagnosis Date  . Allergic rhinitis   . Anemia   . Asthma   . Chronic bronchitis ([place])   . Colonic polyp   . DDD (degenerative disc disease), lumbar 07/20/2011  . DJD (degenerative joint disease) of knee    bilateral  . DJD (degenerative joint disease), lumbar 07/20/2011  . Esophageal stricture   . GERD (gastroesophageal reflux disease)   . Hiatal hernia   . Hyperlipidemia   . Osteoporosis   . Stroke [facility])    "didn't know I'd had one before I was told; didn't do any damage" (11/24/2016)  . Vocal cord dysfunction     Plan: [facility] RN CM scheduled this patient for another call attempt within 4-7 business days  [name] L. [name], RN, BSN, [place] Coordinator Office number [phone] Mobile number [phone]  Main [facility] number [phone] Fax number [phone]

## 2021-04-15 DIAGNOSIS — J4520 Mild intermittent asthma, uncomplicated: Secondary | ICD-10-CM | POA: Diagnosis not present

## 2021-04-15 DIAGNOSIS — E782 Mixed hyperlipidemia: Secondary | ICD-10-CM | POA: Diagnosis not present

## 2021-04-15 DIAGNOSIS — Z0000 Encounter for general adult medical examination without abnormal findings: Secondary | ICD-10-CM | POA: Diagnosis not present

## 2021-04-15 DIAGNOSIS — N182 Chronic kidney disease, stage 2 (mild): Secondary | ICD-10-CM | POA: Diagnosis not present

## 2021-04-15 DIAGNOSIS — I700 Atherosclerosis of aorta: Secondary | ICD-10-CM | POA: Diagnosis not present

## 2021-04-15 DIAGNOSIS — D473 Essential (hemorrhagic) thrombocythemia: Secondary | ICD-10-CM | POA: Diagnosis not present

## 2021-04-15 DIAGNOSIS — M059 Rheumatoid arthritis with rheumatoid factor, unspecified: Secondary | ICD-10-CM | POA: Diagnosis not present

## 2021-04-15 DIAGNOSIS — J849 Interstitial pulmonary disease, unspecified: Secondary | ICD-10-CM | POA: Diagnosis not present

## 2021-04-15 DIAGNOSIS — J432 Centrilobular emphysema: Secondary | ICD-10-CM | POA: Diagnosis not present

## 2021-04-21 ENCOUNTER — Other Ambulatory Visit: Payer: Self-pay | Admitting: *Deleted

## 2021-04-21 NOTE — Patient Outreach (Signed)
[facility]) Care Management  04/21/2021  [name] [birth year] [id number]   [facility] second Unsuccessful outreach  [name] was referred to [id number] on 02/25/21 by [facility] hospital liaison Referral Reason:for post hospital/complex care services Pt previously with [facility] disease management J Wine prior to admission  Insurance:united healthcare medicareAARP /blue cross and blue shield state PPO  [place] admission 02/20/21 to 02/24/21 asthma exacerbation   Outreach attempt to the home number  No answer. [facility] RN CM left HIPAA [facility] Portability and Accountability Act) compliant voicemail message along with CM's contact info.   Plan: [facility] RN CM scheduled this patient for another call attempt within 4-7 business days [facility] unsuccessful outreach letter sent to [name] engaged patient   Joelene Millin L. [name], RN, BSN, [place] Coordinator Office number [phone] Mobile number [phone]  Main [facility] number [phone] Fax number [phone]

## 2021-04-23 ENCOUNTER — Ambulatory Visit: Payer: Medicare Other | Admitting: Adult Health

## 2021-04-26 ENCOUNTER — Ambulatory Visit: Payer: Medicare Other | Admitting: Adult Health

## 2021-04-28 ENCOUNTER — Other Ambulatory Visit: Payer: Self-pay | Admitting: *Deleted

## 2021-04-28 ENCOUNTER — Ambulatory Visit (AMBULATORY_SURGERY_CENTER): Payer: Self-pay

## 2021-04-28 ENCOUNTER — Other Ambulatory Visit: Payer: Self-pay

## 2021-04-28 VITALS — Ht 62.0 in | Wt 164.0 lb

## 2021-04-28 DIAGNOSIS — Z86010 Personal history of colonic polyps: Secondary | ICD-10-CM

## 2021-04-28 MED ORDER — PEG-KCL-NACL-NASULF-NA ASC-C 100 G PO SOLR: 1.0000 | Freq: Once | ORAL | 0 refills | Status: AC

## 2021-04-28 NOTE — Progress Notes (Signed)
No egg or soy allergy known to patient  No issues with past sedation with any surgeries or procedures Patient denies ever being told they had issues or difficulty with intubation  No FH of Malignant Hyperthermia No diet pills per patient No home 02 use per patient  No blood thinners per patient  Pt denies issues with constipation at this time;  No A fib or A flutter  COVID 19 guidelines implemented in PV today with Pt and RN  Pt is fully vaccinated for Covid x 2 + booster; NO PA's for preps discussed with pt in PV today  Discussed with pt there will be an out-of-pocket cost for prep and that varies from $0 to 70 dollars  Due to the COVID-19 pandemic we are asking patients to follow certain guidelines.  Pt aware of COVID protocols and [facility] guidelines

## 2021-04-28 NOTE — Patient Outreach (Signed)
[facility]) Care Management  04/28/2021  [name] 09/13/1946 [id number]  [facility] incoming outreach from patient [name] was referred to [facility] on3/24/22byTHN hospital liaison Referral Reason:for post hospital/complex care services Pt previously with [facility] disease management J Wine prior to admission  Insurance:united healthcare medicareAARP /blue cross and blue shield state PPO  [facility] 02[facility] exacerbation   [name] Placido returned a call to [place] Patient is able to verify HIPAA ([place] and Accountability Act) identifiers Reviewed and addressed the purpose of the follow up call with the patient  Consent: [facility] RN CM reviewed [facility] services with patient. Patient gave verbal consent for services.   Assessment She was wished a happy pre birthday [name] Kaufman reports she is doing good medically but fair socially as she had a recent loss of niece whom she was very close to her, like a sister  She reports support from family, friends and church members has a son that calls daily 2-3 times  Her Bishop outreached out to her  Her friend took her out to have coffee on 04/27/21 She attends Prayer every Wednesday mornings Stages of Grief were discussed She was offered the availability of [facility] services for Grief and the availability to outreach to [facility] RN CM prn  She denies need for at referral to [place] but appreciates the offer to outreach to [facility] RN CM  She was given time to ventilate with empathy  She denies respiratory worsening symptoms as she reports she tries to stay inside during extreme temperature changes, especially like the weather we have had recently   She reports she is coping well and intends to remain as active as possible  She confirms she is scheduled for a Colonoscopy 05/12/21 Grandson wedding 05/15/21 And she want to visit her cousin in [place] after the wedding  for her birthday   Discussed her [facility] progression as her Asthma is being managed at home Her last admission was 02/20/21 to 02/24/21 Her Asthma is managed at 5.8 as of 02/20/21 HgA1c lab value  Discussed follow up on her grief with pending transfer to [facility] health coach prn   Patient Active Problem List   Diagnosis Date Noted  . Centrilobular emphysema ([place]) 04/13/2021  . 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 Cramp and spasm 03/01/2021  . Cramp in lower leg associated with rest 03/01/2021  . Hardening of the aorta (main artery of the heart) ([place]) 03/01/2021  . Leukopenia 03/01/2021  . Menopause 03/01/2021  . Mild intermittent asthma 03/01/2021  . Multiple joint pain 03/01/2021  . Obesity 03/01/2021  . Seropositive rheumatoid arthritis ([place]) 03/01/2021  . Severe major depression, single episode, without psychotic features ([place]) 03/01/2021  . Moderate persistent asthma with (acute) exacerbation 02/22/2021  . Asthma exacerbation 02/21/2021  . Hyperglycemia 02/21/2021  . Asthma-COPD overlap syndrome ([place]) 07/08/2020  . On methotrexate therapy 07/10/2018  . Abnormal laboratory test 03/29/2018  . Sickle cell trait ([place]) 01/20/2017  . JAK-2 gene mutation 12/06/2016  . AKI (acute kidney injury) ([place]) 11/24/2016  . Right shoulder pain 11/24/2016  . Acute respiratory distress 11/24/2016  . RLS (restless legs syndrome) 11/24/2016  . Restless leg syndrome 10/13/2016  . Primary osteoarthritis of left knee 02/18/2015  . Shingles 12/19/2014  . ILD (interstitial lung disease) ([place])   . Atelectasis   . Essential thrombocythemia ([place]) 12/24/2013  . S/P left knee arthroscopy 11/11/2013  . TIA (transient ischemic attack) 09/01/2012  .  Chest pain 09/01/2012  . Arm numbness left 06/03/2012  . Migraine 05/31/2012  . Dyspnea on exertion 08/09/2011  . DDD (degenerative disc disease), lumbar 07/20/2011  . DJD (degenerative joint disease), lumbar 07/20/2011  . CVA (cerebral infarction)  07/20/2011  . Syncope and collapse 07/12/2011  . Acute lumbar radiculopathy 07/12/2011  . Encounter for general adult medical examination without abnormal findings 07/10/2011  . TRIGGER FINGER, RIGHT MIDDLE 01/11/2011  . Other dysphagia 01/11/2011  . MENOPAUSAL DISORDER 07/05/2010  . OSTEOARTHRITIS, KNEES, BILATERAL 07/05/2010  . NEVUS 05/20/2010  . VAGINITIS 03/23/2010  . SUPERFICIAL THROMBOPHLEBITIS 03/20/2009  . FATIGUE 03/20/2009  . DYSPHAGIA UNSPECIFIED 02/16/2009  . Snoring 11/05/2008  . HERPES ZOSTER 06/30/2008  . Diabetes ([place]) 06/30/2008  . Mixed hyperlipidemia 06/30/2008  . ESOPHAGEAL STRICTURE 06/30/2008  . FOOT PAIN, RIGHT 06/30/2008  . OSTEOPOROSIS 06/30/2008  . COLONIC POLYPS, HX OF 06/30/2008  . PANIC ATTACK 09/05/2007  . Allergic rhinitis 09/05/2007  . Vocal cord dysfunction 09/05/2007  . Intrinsic asthma 09/05/2007  . Gastroesophageal reflux disease without esophagitis 09/05/2007   Outpatient Encounter Medications as of 04/28/2021  Medication Sig Note  . albuterol (PROVENTIL HFA) 108 (90 Base) MCG/ACT inhaler Inhale 2 puffs into the lungs every 4 (four) hours as needed for wheezing or shortness of breath. Reported on 12/30/2015   . albuterol (PROVENTIL) (2.5 MG/3ML) 0.083% nebulizer solution Take 3 mLs (2.5 mg total) by nebulization every 4 (four) hours as needed for shortness of breath.   Marland Kitchen arformoterol (BROVANA) 15 MCG/2ML NEBU Take 2 mLs (15 mcg total) by nebulization 2 (two) times daily. (Patient not taking: Reported on 04/28/2021)   . aspirin EC 81 MG tablet Take 81 mg by mouth daily.   . Budeson-Glycopyrrol-Formoterol (BREZTRI AEROSPHERE) 160-9-4.8 MCG/ACT AERO Inhale 2 puffs into the lungs in the morning and at bedtime.   . Budeson-Glycopyrrol-Formoterol (BREZTRI AEROSPHERE) 160-9-4.8 MCG/ACT AERO 2 puffs twice daily   . budesonide (PULMICORT) 0.25 MG/2ML nebulizer solution Take 2 mLs (0.25 mg total) by nebulization 2 (two) times daily. (Patient taking  differently: Take 0.25 mg by nebulization 2 (two) times daily as needed.)   . CALCIUM-CHOLECALCIFEROL PO Take 1 tablet by mouth daily at 6 (six) AM.   . cholecalciferol (VITAMIN D) 1000 UNITS tablet Take 1,000 Units by mouth daily.   . fluticasone (FLONASE) 50 MCG/ACT nasal spray Place 1 spray into the nose 2 (two) times daily.   . folic acid (FOLVITE) 1 MG tablet Take 1 tablet (1 mg total) by mouth daily.   Marland Kitchen gabapentin (NEURONTIN) 100 MG capsule Take 100 mg by mouth at bedtime.   . hydroxyurea (HYDREA) 500 MG capsule Take 1 capsule (500 mg total) by mouth 3 (three) times daily before meals. 02/21/2021: All 3 doses as of 02/19/2021  . Magnesium 250 MG TABS Take 1 tablet by mouth daily.   . montelukast (SINGULAIR) 10 MG tablet Take 1 tablet (10 mg total) by mouth at bedtime.   . pantoprazole (PROTONIX) 40 MG tablet Take 1 tablet (40 mg total) by mouth 2 (two) times daily. Take 30- 60 min before your first and last meals of the day   . peg 3350 powder (MOVIPREP) 100 g SOLR Take 1 kit (200 g total) by mouth once for 1 dose.   . promethazine-dextromethorphan (PROMETHAZINE-DM) 6.25-15 MG/5ML syrup Take 5 mLs by mouth 4 (four) times daily as needed for cough.   . revefenacin (YUPELRI) 175 MCG/3ML nebulizer solution Take 3 mLs (175 mcg total) by nebulization daily. (Patient not taking:  Reported on 04/28/2021)   . rosuvastatin (CRESTOR) 10 MG tablet Take 10 mg by mouth at bedtime.    Facility-Administered Encounter Medications as of 04/28/2021  Medication  . 0.9 %  sodium chloride infusion    Plans [facility] RN CM scheduled this patient for another call attempt within 30 business days to follow up on her grieving and to progress [facility] services  Pt encouraged to return a call to [facility] RN CM prn Goals Addressed              This Visit's Progress     Patient Stated   .  COMPLETED: [facility]) Manage My Medicine (pt-stated)   On track     Timeframe:  Short-Term Goal Priority:  High Start Date:             03/01/21                 Expected End Date:          04/02/21             Completed on 04/28/21   - call for medicine refill 2 or 3 days before it runs out     Notes: 04/28/21 She confirms she has her medications and this goal is resolved  Unsuccessful outreaches on 04/13/21, 04/21/21, 04/28/21  03/23/21 denies worsening symptoms Denies care coordination or medical needs awaiting [facility] RN visit, niece not doing well, reports upstream outreached, has inhalers and folic acid  07/13/37 pt inquiring about folic acid that is needed Will purchase folic acid for local discount store/pharmacy 03/02/21 [facility] RN CM response from pulmonologist and outreach to pcp nurse related to Zithromax not filled by pt. 03/01/21 states having financial concern with filling Zithromax. - discuss outreach to MDs     .  COMPLETED: (THN) Track and Manage My Symptoms-Asthma (pt-stated)   On track     Timeframe:  Short-Term Goal Priority:  High Start Date:                      03/01/21       Expected End Date:           04/02/21            Case completed on 04/28/21   - avoid symptom triggers outdoors - follow asthma action plan - keep rescue medicines on hand     Notes:  04/28/21 reports asthma/COPD symptoms managed at home She is avoiding her triggers of extreme heat and cold She reports staying in her home and going out only as needed Unsuccessful outreaches on 04/13/21, 04/21/21, 04/28/21   03/23/21 denies worsening symptoms Denies care coordination or medical needs awaiting [facility] RN visit, niece not doing well, reports upstream outreached, has inhalers and folic acid  1/0/17 following asthma plan Had outreach from upstream staff To go to purchase folic acid 04/13/24 has pulmonology appointment and hs prednisone    .  [facility]) Track and Manage My Symptoms-Depression (pt-stated)   On track     Timeframe:  Short-Term Goal Priority:  High Start Date:                   04/28/21          Expected End Date:      06/03/21                  Follow Up Date 06/02/21   - spend time or talk with others every day  Notes:  04/28/21 Pt is speaking with family, friends, church members including her Bishop daily She has son who calls 2-3 times daily Unsuccessful outreaches on 04/13/21, 04/21/21, 04/28/21          Rynell Ciotti L. [name], RN, BSN, [place] Coordinator Office number [phone] Main [facility] number [phone] Fax number [phone]

## 2021-04-28 NOTE — Patient Outreach (Signed)
[facility]) Care Management  04/28/2021  ANDRIANNA MANALANG 1946/04/26 [id number]   [facility] third Unsuccessful outreach  Mrs RUDINE RIEGER was [facility] to [facility] on3/24/22byTHN hospital liaison Referral Reason:for post hospital/complex care services Pt previously with [facility] disease management J Wine prior to admission  Insurance:united healthcare medicareAARP /blue cross and blue shield state PPO  [facility] exacerbation  Last successful outreach on 03/23/21  Outreach attempt to the home number [phone] No answer. [facility] RN CM left HIPAA [facility] Portability and Accountability Act) compliant voicemail message along with CM's contact info.  Plan: [facility] RN CM scheduled this patient for case closure per [facility] workflow  [facility] unsuccessful outreach letter sent to [facility] engaged patient on 04/21/21 Unsuccessful outreaches on 04/13/21, 04/21/21, 04/28/21   Arnold Depinto L. [name], RN, BSN, [place] Coordinator Office number [phone] Mobile number [phone]  Main [facility] number [phone] Fax number [phone]

## 2021-05-12 ENCOUNTER — Ambulatory Visit (AMBULATORY_SURGERY_CENTER): Payer: Medicare Other | Admitting: Internal Medicine

## 2021-05-12 ENCOUNTER — Encounter: Payer: Self-pay | Admitting: Internal Medicine

## 2021-05-12 ENCOUNTER — Other Ambulatory Visit: Payer: Self-pay

## 2021-05-12 VITALS — BP 121/82 | HR 80 | Temp 96.9°F | Resp 18 | Ht 62.0 in | Wt 164.0 lb

## 2021-05-12 DIAGNOSIS — Z86010 Personal history of colonic polyps: Secondary | ICD-10-CM | POA: Diagnosis not present

## 2021-05-12 DIAGNOSIS — Z1211 Encounter for screening for malignant neoplasm of colon: Secondary | ICD-10-CM | POA: Diagnosis not present

## 2021-05-12 MED ORDER — SODIUM CHLORIDE 0.9 % IV SOLN: 500.0000 mL | Freq: Once | INTRAVENOUS | Status: DC

## 2021-05-12 NOTE — Progress Notes (Signed)
A and O x3. Report to RN. Tolerated MAC anesthesia well.

## 2021-05-12 NOTE — Progress Notes (Signed)
Pt's states no medical or surgical changes since previsit or office visit.  HC vitals and SB IV.

## 2021-05-12 NOTE — Patient Instructions (Addendum)
Will need to repeat Colonoscopy in one year due to inadequate prep for today's Colonoscopy - should get a reminder  To make this appointment    YOU HAD AN ENDOSCOPIC PROCEDURE TODAY AT [facility]:   Refer to the procedure report that was given to you for any specific questions about what was found during the examination.  If the procedure report does not answer your questions, please call your gastroenterologist to clarify.  If you requested that your care partner not be given the details of your procedure findings, then the procedure report has been included in a sealed envelope for you to review at your convenience later.  YOU SHOULD EXPECT: Some feelings of bloating in the abdomen. Passage of more gas than usual.  Walking can help get rid of the air that was put into your GI tract during the procedure and reduce the bloating. If you had a lower endoscopy (such as a colonoscopy or flexible sigmoidoscopy) you may notice spotting of blood in your stool or on the toilet paper. If you underwent a bowel prep for your procedure, you may not have a normal bowel movement for a few days.  Please Note:  You might notice some irritation and congestion in your nose or some drainage.  This is from the oxygen used during your procedure.  There is no need for concern and it should clear up in a day or so.  SYMPTOMS TO REPORT IMMEDIATELY:   Following lower endoscopy (colonoscopy or flexible sigmoidoscopy):  Excessive amounts of blood in the stool  Significant tenderness or worsening of abdominal pains  Swelling of the abdomen that is new, acute  Fever of 100F or higher    For urgent or emergent issues, a gastroenterologist can be reached at any hour by calling [phone]. Do not use MyChart messaging for urgent concerns.    DIET:  We do recommend a small meal at first, but then you may proceed to your regular diet.  Drink plenty of fluids but you should avoid alcoholic beverages  for 24 hours.  ACTIVITY:  You should plan to take it easy for the rest of today and you should NOT DRIVE or use heavy machinery until tomorrow (because of the sedation medicines used during the test).    FOLLOW UP: Our staff will call the number listed on your records 48-72 hours following your procedure to check on you and address any questions or concerns that you may have regarding the information given to you following your procedure. If we do not reach you, we will leave a message.  We will attempt to reach you two times.  During this call, we will ask if you have developed any symptoms of COVID 19. If you develop any symptoms (ie: fever, flu-like symptoms, shortness of breath, cough etc.) before then, please call [phone].  If you test positive for Covid 19 in the 2 weeks post procedure, please call and report this information to [place].    If any biopsies were taken you will be contacted by phone or by letter within the next 1-3 weeks.  Please call us at [phone] if you have not heard about the biopsies in 3 weeks.    SIGNATURES/CONFIDENTIALITY: You and/or your care partner have signed paperwork which will be entered into your electronic medical record.  These signatures attest to the fact that that the information above on your After Visit Summary has been reviewed and is understood.  Full responsibility of  the confidentiality of this discharge information lies with you and/or your care-partner. 

## 2021-05-12 NOTE — Op Note (Signed)
[facility] Patient Name: [name] Procedure Date: 05/12/2021 7:22 AM MRN: [id number] Endoscopist: Docia Chuck. Henrene Pastor , MD Age: 75 Referring MD:  Date of Birth: 1945/12/11 Gender: Female Account #: [id number] Procedure:                Colonoscopy Indications:              High risk colon cancer surveillance: Personal                            history of multiple (3 or more) adenomas. Also                            family history of colon cancer in father (38s).                            Previous examinations 1999, 2003, 2005, 2008, 2013,                            2017 Medicines:                Monitored Anesthesia Care Procedure:                Pre-Anesthesia Assessment:                           - Prior to the procedure, a History and Physical                            was performed, and patient medications and                            allergies were reviewed. The patient's tolerance of                            previous anesthesia was also reviewed. The risks                            and benefits of the procedure and the sedation                            options and risks were discussed with the patient.                            All questions were answered, and informed consent                            was obtained. Prior Anticoagulants: The patient has                            taken no previous anticoagulant or antiplatelet                            agents. ASA Grade Assessment: II - A patient with  mild systemic disease. After reviewing the risks                            and benefits, the patient was deemed in                            satisfactory condition to undergo the procedure.                           After obtaining informed consent, the colonoscope                            was passed under direct vision. Throughout the                            procedure, the patient's blood pressure, pulse, and                             oxygen saturations were monitored continuously. The                            Olympus CF-HQ190L [id number]) Colonoscope was                            introduced through the anus and advanced to the the                            cecum, identified by appendiceal orifice and                            ileocecal valve. The ileocecal valve, appendiceal                            orifice, and rectum were photographed. The quality                            of the bowel preparation was inadequate. The                            colonoscopy was performed without difficulty. The                            patient tolerated the procedure well. The bowel                            preparation used was MoviPrep via split dose                            instruction. Scope In: 8:11:42 AM Scope Out: 8:28:28 AM Scope Withdrawal Time: 0 hours 10 minutes 23 seconds  Total Procedure Duration: 0 hours 16 minutes 46 seconds  Findings:                 The entire examined colon appeared normal on direct  and retroflexion views. Complications:            No immediate complications. Estimated blood loss:                            None. Estimated Blood Loss:     Estimated blood loss: none. Impression:               - Preparation of the colon was inadequate.                           - The entire examined colon is normal on direct and                            retroflexion views.                           - No specimens collected. Recommendation:           - Repeat colonoscopy in 1 year for surveillance.                            MORE EXTENSIVE PREPARATION                           - Patient has a contact number available for                            emergencies. The signs and symptoms of potential                            delayed complications were discussed with the                            patient. Return to normal activities tomorrow.                             Written discharge instructions were provided to the                            patient.                           - Resume previous diet.                           - Continue present medications. Docia Chuck. Henrene Pastor, MD 05/12/2021 8:43:21 AM This report has been signed electronically.

## 2021-05-14 ENCOUNTER — Telehealth: Payer: Self-pay | Admitting: *Deleted

## 2021-05-14 NOTE — Telephone Encounter (Signed)
  Follow up Call-  Call back number 05/12/2021 09/25/2018  Post procedure Call Back phone  # [phone]  Permission to leave phone message Yes Yes  Some recent data might be hidden     Patient questions:  Do you have a fever, pain , or abdominal swelling? No. Pain Score  0 *  Have you tolerated food without any problems? Yes.    Have you been able to return to your normal activities? Yes.    Do you have any questions about your discharge instructions: Diet   No. Medications  No. Follow up visit  No.  Do you have questions or concerns about your Care? No.  Actions: * If pain score is 4 or above: No action needed, pain <4.  Have you developed a fever since your procedure? no  2.   Have you had an respiratory symptoms (SOB or cough) since your procedure? no  3.   Have you tested positive for COVID 19 since your procedure no  4.   Have you had any family members/close contacts diagnosed with the COVID 19 since your procedure?  no   If yes to any of these questions please route to Joylene John, RN and Joella Prince, RN

## 2021-05-17 DIAGNOSIS — Z1231 Encounter for screening mammogram for malignant neoplasm of breast: Secondary | ICD-10-CM | POA: Diagnosis not present

## 2021-05-25 ENCOUNTER — Telehealth: Payer: Self-pay | Admitting: Emergency Medicine

## 2021-05-25 MED ORDER — BREZTRI AEROSPHERE 160-9-4.8 MCG/ACT IN AERO: 2.0000 | INHALATION_SPRAY | Freq: Two times a day (BID) | RESPIRATORY_TRACT | 0 refills | Status: DC

## 2021-05-25 NOTE — Telephone Encounter (Signed)
Pt is requesting samples of Breztri. Pls regard; [phone]

## 2021-05-25 NOTE — Telephone Encounter (Signed)
Patient called requesting samples of Breztri.  Samples placed at front desk for patient pick up.  Nothing further at this time.

## 2021-06-02 ENCOUNTER — Other Ambulatory Visit: Payer: Self-pay | Admitting: *Deleted

## 2021-06-02 NOTE — Patient Outreach (Signed)
[facility]) Care Management  06/02/2021  [name] Apr 04, 1946 [id number]   [facility] Unsuccessful outreach [name] was referred to [facility] on 02/25/21 by [facility] hospital liaison Referral Reason: for post hospital/complex care services Pt previously with [facility] disease management J Wine prior to admission   Insurance: united healthcare medicare [place]da [place] /blue cross and blue shield state PPO   Tolu admission 02/20/21 to 02/24/21 asthma exacerbation   Last successful outreac on 04/28/21   Outreach attempt to the home number  No answer. [facility] RN CM left HIPAA [facility] Portability and Accountability Act) compliant voicemail message along with CM's contact info.   Plan: [facility] RN CM scheduled this patient for another call attempt within 4-7 business days  Brodi Nery L. [name], RN, BSN, [place] Coordinator Office number [phone] Mobile number [phone]  Main [facility] number [phone] Fax number [phone]

## 2021-06-03 DIAGNOSIS — K219 Gastro-esophageal reflux disease without esophagitis: Secondary | ICD-10-CM | POA: Diagnosis not present

## 2021-06-03 DIAGNOSIS — J45909 Unspecified asthma, uncomplicated: Secondary | ICD-10-CM | POA: Diagnosis not present

## 2021-06-03 DIAGNOSIS — J4541 Moderate persistent asthma with (acute) exacerbation: Secondary | ICD-10-CM | POA: Diagnosis not present

## 2021-06-03 DIAGNOSIS — E782 Mixed hyperlipidemia: Secondary | ICD-10-CM | POA: Diagnosis not present

## 2021-06-11 ENCOUNTER — Other Ambulatory Visit: Payer: Self-pay | Admitting: *Deleted

## 2021-06-11 ENCOUNTER — Other Ambulatory Visit: Payer: Self-pay

## 2021-06-11 NOTE — Patient Outreach (Signed)
[facility]) Care Management  06/11/2021  [name] October 23, 1946 [id number]  [facility] outreach to complex care patient [name] was referred to [facility] on 02/25/21 by [facility] hospital liaison Referral Reason: for post hospital/complex care services Pt previously with [facility] disease management J Wine prior to admission   Insurance: united healthcare medicare [employer] /blue cross and blue shield state PPO   Patient is able to verify HIPAA ([place] and Accountability Act) identifiers Reviewed and addressed the purpose of the follow up call with the patient  Consent: [facility] RN CM reviewed [facility] services with patient. Patient gave verbal consent for services.   Assessment Outreach brief with patient as she reports she is driving home.  She reports she did not go on her planned trip but feels better related to depression symptoms. She continues to speak and interact with family, friends and church members  Asthma  She reports she has had some shortness of breath (sob) "it has been so hot lately" She and RN CM discussed her symptoms  She was encouraged to outreach to her pulmonologist to see if a change need to occur in her treatment plan during the humid hot months She reports she has an upcoming appointment and will do so  No noted appointment listed in EPIC for pulmonologist Last sen on 05/25/21   Present voiced concern -left arm and chest pain [name] reports she has experience this morning some left arm and chest burning pain She has a history (hx) of GERD (gastroesophageal reflux disease) and 09/01/12 exertional chest pain that was reported to her pulmonologist, Dr Lamonte Sakai, to get better with rest and which can sometimes affect her left arm.  She was reported to also have vocal cord dysfunction, her deconditioning, her anxiety etc. She had a reassuring Myoview in 2012. Denies Headache, nausea, vomiting, no trouble speaking or moving  extremities No injuries She informs RN CM she is taking her aspirin 81 mg daily, Asthma medicines and Protonix Reports sob she feels related to asthma but RN CM discuss sob is s/s of MI She has not taken any vital signs today She reports she will take her BP when she arrives home Reviewed signs and symptoms (s/s) of stroke and myocardial infarction (MI) Encourage seeking care from 24 hour nurse call center , primary care provider (PCP), urgent care, or Emergency room if symptoms worsen She voiced understanding  Outpatient Encounter Medications as of 06/11/2021  Medication Sig Note   aspirin EC 81 MG tablet Take 81 mg by mouth daily.    pantoprazole (PROTONIX) 40 MG tablet Take 1 tablet (40 mg total) by mouth 2 (two) times daily. Take 30- 60 min before your first and last meals of the day    albuterol (PROVENTIL HFA) 108 (90 Base) MCG/ACT inhaler Inhale 2 puffs into the lungs every 4 (four) hours as needed for wheezing or shortness of breath. Reported on 12/30/2015    albuterol (PROVENTIL) (2.5 MG/3ML) 0.083% nebulizer solution Take 3 mLs (2.5 mg total) by nebulization every 4 (four) hours as needed for shortness of breath.    arformoterol (BROVANA) 15 MCG/2ML NEBU Take 2 mLs (15 mcg total) by nebulization 2 (two) times daily. (Patient not taking: No sig reported)    Budeson-Glycopyrrol-Formoterol (BREZTRI AEROSPHERE) 160-9-4.8 MCG/ACT AERO Inhale 2 puffs into the lungs in the morning and at bedtime.    Budeson-Glycopyrrol-Formoterol (BREZTRI AEROSPHERE) 160-9-4.8 MCG/ACT AERO Inhale 2 puffs into the lungs in the morning and  at bedtime.    budesonide (PULMICORT) 0.25 MG/2ML nebulizer solution Take 2 mLs (0.25 mg total) by nebulization 2 (two) times daily. (Patient taking differently: Take 0.25 mg by nebulization 2 (two) times daily as needed.)    CALCIUM-CHOLECALCIFEROL PO Take 1 tablet by mouth daily at 6 (six) AM.    cholecalciferol (VITAMIN D) 1000 UNITS tablet Take 1,000 Units by mouth daily.     fluticasone (FLONASE) 50 MCG/ACT nasal spray Place 1 spray into the nose 2 (two) times daily.    folic acid (FOLVITE) 1 MG tablet Take 1 tablet (1 mg total) by mouth daily.    gabapentin (NEURONTIN) 100 MG capsule Take 100 mg by mouth at bedtime.    hydroxyurea (HYDREA) 500 MG capsule Take 1 capsule (500 mg total) by mouth 3 (three) times daily before meals. 02/21/2021: All 3 doses as of 02/19/2021   Magnesium 250 MG TABS Take 1 tablet by mouth daily.    montelukast (SINGULAIR) 10 MG tablet Take 1 tablet (10 mg total) by mouth at bedtime.    promethazine-dextromethorphan (PROMETHAZINE-DM) 6.25-15 MG/5ML syrup Take 5 mLs by mouth 4 (four) times daily as needed for cough.    revefenacin (YUPELRI) 175 MCG/3ML nebulizer solution Take 3 mLs (175 mcg total) by nebulization daily. (Patient not taking: No sig reported)    rosuvastatin (CRESTOR) 10 MG tablet Take 10 mg by mouth at bedtime.    Facility-Administered Encounter Medications as of 06/11/2021  Medication   0.9 %  sodium chloride infusion    Plans [facility] RN CM will follow up with the patient in the next 30 business days or prn pending outreach from patient Pt encouraged to return a call to [facility] RN CM prn  Goals Addressed               This Visit's Progress     Patient Stated     [facility]) Manage Pain (chest/left arm pain) (pt-stated)   Not on track     Timeframe:  Short-Term Goal Priority:  High Start Date:                            06/11/21 Expected End Date:              07/02/21         Follow Up Date 06/28/21 Barriers: Knowledge     - track what makes the pain worse and what makes it better  - check BP -Seek emergency services as needed if worsens  Notes:  06/11/21 reports she has experience this morning some left arm and chest burning pain She has a history (hx) of GERD (gastroesophageal reflux disease).Denies Headache, nausea, vomiting, no trouble speaking or moving extremities.No injuries She informs RN CM she is taking her  aspirin 81 mg daily, Asthma medicines and Protonix.Reports sob she feels related to asthma but RN CM discuss sob is s/s of MI She has not taken any vital signs today She reports she will take her BP when she arrives home. Reports will seek emergency services if worsens       [facility]) Track and Manage My Symptoms-Depression (pt-stated)   On track     Timeframe:  Short-Term Goal Priority:  High Start Date:                   04/28/21          Expected End Date:      06/03/21  Completed goal 06/11/21 Barriers: Knowledge    - spend time or talk with others every day    Why is this important?   Keeping track of your progress will help your treatment team find the right mix of medicine and therapy for you.  Write in your journal every day.  Day-to-day changes in depression symptoms are normal. It may be more helpful to check your progress at the end of each week instead of every day.     Notes:  06/11/21 completed goal- Pt reports she is doing better. Has been out of the home today, driving back to home. Continues to speak with family, friends, church members including her Bishop daily 06/02/21 outreach unsuccessful  04/28/21 Pt is speaking with family, friends, church members including her Bishop daily She has son who calls 2-3 times daily Unsuccessful outreaches on 04/13/21, 04/21/21, 04/28/21            Joelene Millin L. [name], RN, BSN, [place] Coordinator Office number [phone] Main [facility] number [phone] Fax number [phone]

## 2021-06-14 ENCOUNTER — Other Ambulatory Visit: Payer: Self-pay | Admitting: *Deleted

## 2021-06-14 NOTE — Patient Outreach (Addendum)
[facility]) Care Management  06/14/2021  KELLYE MIZNER October 06, 1946 [id number]  [place] coordination/telephonic outreach to patient, pharmacy vendor  Patient left a message requesting a return a call to her  [facility] RN CM outreach to patient Patient is able to verify HIPAA ([place] and Accountability Act) identifiers Consent: [facility] ([place]) RN CM reviewed [facility] services with patient. Patient gave verbal consent for services.  Assessment She reports she had a call [facility] $63+ charge for medications that she believes is no charge from [facility]. Encouraged outreach to [facility]. [employer] does not provide no charge medicines  Conference call with patient to [facility] [phone] prompt #2  Spoke with Chastity who confirms patient has 4 medicines pending delivery ($66.17 for a 90 day supply) Pt reports she is not able to afford the price and states "I thought the medicines were for free" Mrs Deol reports she had received a 90 day supply previously "for free" from [facility] via the mail  Pt and RN CM transferred and spoke with Meyer Cory was able to inform patient that in April 2022 [facility] delivered her medications. one of the medication was $4. [facility] is reported not to send medications via mail  [facility] RN CM attempted to assess pt for clarity about the April 2022 medicines. She states she has a bag/box stating [facility] She is not sure if the box was delivered or mailed as it was on her porch as she reports she was not home Mrs [facility] technician is Polo Riley will return a call to patient per Allie to try to assist with this Pt made aware of [facility] and the number to outreach if no response [facility] RN CM advocated and asked questions about possible assistance from [facility] for a 30 day supply for pt if medicines are needed soon. Mrs Lueck will speak with [facility] Flattery about this  Previously voiced concern -left arm and chest pain resolved per  patient Denies any symptoms today and reports she did not have to seek emergency services. Unable to tell [facility] RN CM the 06/11/21 BP reading  Plan [facility] RN CM will follow up with patient within the next 30 business days Patient agrees to care plan   Goals Addressed               This Visit's Progress     Patient Stated     [facility]) Manage My Medicine (pt-stated)   Not on track     Timeframe:  Short-Term Goal Priority:  High Start Date:            06/14/21                 Expected End Date:          729/22             Completed on 06/28/21   - call for medicine refill 2 or 3 days before it runs out  -call [facility] to get medicine assistance     Notes: 06/14/21 pt called left message about a charge for medicine she reports she can not afford. Agreed to conference call to [facility] to review this. To work with [facility] Fl[facility] staff about this concern 04/28/21 She confirms she has her medications and this goal is resolved  Unsuccessful outreaches on 04/13/21, 04/21/21, 04/28/21  03/23/21 denies worsening symptoms Denies care coordination or medical needs awaiting [facility] RN visit, niece not doing well, reports [facility] outreached, has inhalers and folic  acid  03/12/21 pt inquiring about folic acid that is needed Will purchase folic acid for local discount store/pharmacy 03/02/21 [facility] RN CM response from pulmonologist and outreach to pcp nurse related to Zithromax not filled by pt. 03/01/21 states having financial concern with filling Zithromax. - discuss outreach to MDs        COMPLETED: [facility]) Manage Pain (chest/left arm pain) (pt-stated)   On track     Timeframe:  Short-Term Goal Priority:  High Start Date:                            06/11/21 Expected End Date:              07/02/21         Follow Up Date 06/28/21 Barriers: Knowledge     - track what makes the pain worse and what makes it better  - check BP -Seek emergency services as needed if worsens    Notes:  06/14/21 reports pain resolved No  further interventions needed  06/11/21 reports she has experience this morning some left arm and chest burning pain She has a history (hx) of GERD (gastroesophageal reflux disease).Denies Headache, nausea, vomiting, no trouble speaking or moving extremities.No injuries She informs RN CM she is taking her aspirin 81 mg daily, Asthma medicines and Protonix.Reports sob she feels related to asthma but RN CM discuss sob is s/s of MI She has not taken any vital signs today She reports she will take her BP when she arrives home. Reports will seek emergency services if worsens         Shafin Pollio L. [name], RN, BSN, [place] Coordinator Office number [phone] Mobile number [phone]  Main [facility] number [phone] Fax number [phone]

## 2021-06-28 ENCOUNTER — Other Ambulatory Visit: Payer: Self-pay

## 2021-06-28 ENCOUNTER — Other Ambulatory Visit: Payer: Self-pay | Admitting: *Deleted

## 2021-06-28 NOTE — Patient Outreach (Signed)
[facility]) Care Management  06/28/2021  [name] [birth year] [id number]   [place] coordination/telephonic outreach to patient, pharmacy vendor   [name] was referred to [facility] on 02/25/21 by [facility] hospital liaison Referral Reason: for post hospital/complex care services Pt previously with [facility] disease management J Wine prior to admission   Insurance: united healthcare medicare AARP /blue cross and blue shield state PPO     Patient is able to verify HIPAA ([place] and Accountability Act) identifiers Reviewed and addressed the purpose of the follow up call with the patient   Consent: [facility] RN CM reviewed [facility] services with patient. Patient gave verbal consent for services.   Assessment She reports she did not receive a return call from [facility] staff Baxter Flattery about a $63+ charge for medications "I have not heard anything since" She otherwise reports she is doing well  Her previously voiced left arm pain remains resolved     Spoke with Baxter Flattery at [facility] who was able to find patient was removed from [place] services with a note indicating the patient outreached on 06/14/21 after 10 am to request returning to her medication mail order program     Plan [facility] RN CM will follow up with patient within the next 30 business days Patient agrees to care plan   Goals Addressed               This Visit's Progress     Patient Stated     [facility]) Manage My Medicine (pt-stated)   On track     Timeframe:  Short-Term Goal Priority:  High Start Date:            06/14/21                 Expected End Date:          729/22             Completed on 07/29/21   - call for medicine refill 2 or 3 days before it runs out  -call [facility] to get medicine assistance     Notes: 06/28/21 she reports no outreach from [facility] since the previous conference outreach but reports she is doing well, denies concerns 06/14/21 pt called  left message about a charge for medicine she reports she can not afford. Agreed to conference call to [facility] to review this. To work with Baxter Flattery [facility] staff about this concern 04/28/21 She confirms she has her medications and this goal is resolved  Unsuccessful outreaches on 04/13/21, 04/21/21, 04/28/21  03/23/21 denies worsening symptoms Denies care coordination or medical needs awaiting [facility] RN visit, niece not doing well, reports [facility] outreached, has inhalers and folic acid  [id number] pt inquiring about folic acid that is needed Will purchase folic acid for local discount store/pharmacy 03/02/21 [facility] RN CM response from pulmonologist and outreach to pcp nurse related to Zithromax not filled by pt. 03/01/21 states having financial concern with filling Zithromax. - discuss outreach to MDs       COMPLETED: [facility]) Manage Pain (chest/left arm pain) (pt-stated)        Timeframe:  Short-Term Goal Priority:  High Start Date:                            06/11/21 Expected End Date:              07/02/21  Follow Up Date 06/28/21 Barriers: Knowledge     - track what makes the pain worse and what makes it better  - check BP -Seek emergency services as needed if worsens    Notes:  06/28/21 reports pain remains resolved 06/14/21 reports pain resolved No further interventions needed  06/11/21 reports she has experience this morning some left arm and chest burning pain She has a history (hx) of GERD (gastroesophageal reflux disease).Denies Headache, nausea, vomiting, no trouble speaking or moving extremities.No injuries She informs RN CM she is taking her aspirin 81 mg daily, Asthma medicines and Protonix.Reports sob she feels related to asthma but RN CM discuss sob is s/s of MI She has not taken any vital signs today She reports she will take her BP when she arrives home. Reports will seek emergency services if worsens      COMPLETED: [facility]) Track and Manage My Symptoms-Depression (pt-stated)         Timeframe:  Short-Term Goal Priority:  High Start Date:                   04/28/21          Expected End Date:      06/03/21                 Completed goal 06/11/21 Barriers: Knowledge    - spend time or talk with others every day     Notes:  06/11/21 completed goal- Pt reports she is doing better. Has been out of the home today, driving back to home. Continues to speak with family, friends, church members including her Bishop daily 06/02/21 outreach unsuccessful  04/28/21 Pt is speaking with family, friends, church members including her Bishop daily She has son who calls 2-3 times daily Unsuccessful outreaches on 04/13/21, 04/21/21, 04/28/21         [name] Millin L. [name], RN, BSN, [place] Coordinator Office number [phone] Main [facility] number [phone] Fax number [phone]

## 2021-07-04 DIAGNOSIS — K219 Gastro-esophageal reflux disease without esophagitis: Secondary | ICD-10-CM | POA: Diagnosis not present

## 2021-07-04 DIAGNOSIS — E782 Mixed hyperlipidemia: Secondary | ICD-10-CM | POA: Diagnosis not present

## 2021-07-04 DIAGNOSIS — J4541 Moderate persistent asthma with (acute) exacerbation: Secondary | ICD-10-CM | POA: Diagnosis not present

## 2021-07-04 DIAGNOSIS — J45909 Unspecified asthma, uncomplicated: Secondary | ICD-10-CM | POA: Diagnosis not present

## 2021-07-29 ENCOUNTER — Ambulatory Visit: Payer: Medicare Other | Admitting: *Deleted

## 2021-08-10 ENCOUNTER — Other Ambulatory Visit: Payer: Self-pay | Admitting: *Deleted

## 2021-08-10 NOTE — Patient Outreach (Signed)
[facility]) Care Management  08/10/2021  JOSILYNN OLINSKI 1946/10/15 [id number]   [facility] Unsuccessful outreach  Mrs KA GAYDON was referred to [facility] on 02/25/21 by [facility] hospital liaison Referral Reason: for post hospital/complex care services Pt previously with [facility] disease management J Wine prior to admission   Insurance: united healthcare medicare AARP /blue cross and blue shield state PPO   Outreach attempt to the home number [phone] No answer. [facility] RN CM left HIPAA [facility] Portability and Accountability Act) compliant voicemail message along with CM's contact info.   Plan: [facility] RN CM scheduled this patient for another call attempt within 4-7 business days Unsuccessful outreach on 08/10/21   Joelene Millin L. [name], RN, BSN, [place] Coordinator Office number [phone] Mobile number [phone]  Main [facility] number [phone] Fax number [phone]

## 2021-08-12 ENCOUNTER — Other Ambulatory Visit: Payer: Self-pay | Admitting: *Deleted

## 2021-08-12 DIAGNOSIS — N182 Chronic kidney disease, stage 2 (mild): Secondary | ICD-10-CM | POA: Insufficient documentation

## 2021-08-12 NOTE — Patient Outreach (Addendum)
[place] [facility]) Care Management  08/12/2021  [name] 09-Oct-1946 [id number]  [facility] incoming outreach from complex care patient  [name] was referred to [facility] on 02/25/21 by [facility] hospital liaison Referral Reason: for post hospital/complex care services Pt previously with [facility] disease management [name] prior to admission   Insurance: united healthcare medicare AARP /blue cross and blue shield state PPO     Patient is able to verify HIPAA ([place] and Accountability Act) identifiers Reviewed and addressed the purpose of the follow up call with the patient   Consent: [facility] ([place] RN CM reviewed [facility] services with patient. Patient gave verbal consent for services.   Assessment Medicine cost concerns, management issue reported with the following medicines: hydroxyurea, Protonix, revefenacin, Crestor, Singulair, gabapentin (legs) folic acid, Breztri, Pulmicort,   Reminded pt of call to [facility] who could not verify she received free medications from them RN CM noted [facility] listed on pt chart and inquired if pt recalled if the box that the medications arrived in was from [place] [phone]  She is presently at her grandson's home and states she will outreach to RN CM when she looks at the box when she returns home  1715 return call to patient to attempt to call [facility] with her. No answer. Left a HIPAA compliant voice message with RN CM availability, office number and the [facility] number she can also use to attempt to get assistance   [facility] pharmacy referral completed  Plans RN CM will attempt to assist pt if she returns a call or outreach to her with in the next 7 business days  [name] Altizer L. [name], RN, BSN, [place] Coordinator Office number [phone] Main [facility] number [phone] Fax number [phone]

## 2021-08-13 NOTE — Patient Outreach (Signed)
[place] [facility]) Care Management  08/13/2021  [name] 1946-09-19 [id number]  Referral for medication assistance from Jackelyn Poling, RN sent to [place].  [name] [facility] Management Assistant [phone]

## 2021-08-17 ENCOUNTER — Other Ambulatory Visit: Payer: Self-pay | Admitting: *Deleted

## 2021-08-17 NOTE — Patient Outreach (Signed)
[facility]) Care Management  08/17/2021  SHAYONA LAVOIE 26-Jun-1946 [id number]   [facility] Unsuccessful outreach   Outreach attempt to the home number  No answer. [facility] RN CM left HIPAA [facility] Portability and Accountability Act) compliant voicemail message along with CM's contact info.   Plan: [facility] RN CM scheduled this patient for another call attempt within 4-7 business days Unsuccessful outreach on 08/17/21   Shneur [place] L. [name], RN, BSN, [place] Coordinator Office number [phone] Mobile number [phone]  Main [facility] number [phone] Fax number [phone]

## 2021-08-20 ENCOUNTER — Other Ambulatory Visit: Payer: Self-pay | Admitting: *Deleted

## 2021-08-20 NOTE — Patient Outreach (Addendum)
[facility] [place] [facility]) Care Management  08/20/2021  [name] May 20, 1946 [id number]   [facility] Unsuccessful outreach   [name] was referred to [facility] on 02/25/21 by [facility] hospital liaison Referral Reason: for post hospital/complex care services Pt previously with [facility] disease management J Wine prior to admission   Insurance: united healthcare medicare AARP /blue cross and blue shield state PPO    Outreach attempt to the home number  No answer. [facility] RN CM left HIPAA [facility] Portability and Accountability Act) compliant voicemail message along with CM's contact info.    Plan: [facility] RN CM scheduled this patient for another call attempt within 4-7 business days Unsuccessful outreach on 08/12/21 after speaking with her, 08/17/21, 08/20/21 to follow up on pharmacy needs Pharmacy referral or possible outreach to [facility] [phone])     Joelene Millin L. [name], RN, BSN, [place] Coordinator Office number [phone] Mobile number [phone]  Main [facility] number [phone] Fax number [phone]

## 2021-08-25 ENCOUNTER — Other Ambulatory Visit: Payer: Self-pay | Admitting: *Deleted

## 2021-08-25 ENCOUNTER — Other Ambulatory Visit: Payer: Self-pay

## 2021-08-25 NOTE — Patient Outreach (Signed)
[place] [facility]) Care Management  08/25/2021  CHAVONNE SFORZA 02-Sep-1946 [id number]   Mrs MOON BUDDE was referred to [facility] on 02/25/21 by [facility] hospital liaison Referral Reason: for post hospital/complex care services Pt previously with [facility] disease management J Wine prior to admission   Insurance: united healthcare medicare AARP /blue cross and blue shield state PPO     Outreach to [phone] Mrs gerstenberger was noted to cough RN CM inquired if she was okay and she reported she was "fair"  She is able to verify HIPAA identifiers  Assessment She reports symptoms of Asthma to include cough & shortness of breath (sob)  but states she is "okay" with the use of her nebulizer    medications  She reports she was sent medicines in the mail and her concerns are resolved  She shared with RN CM she could speak only briefly today as she is not at home  She reports she is at her grandson's home and does not have her box of medication with her She requests to outreach to RN CM when she is at home "tomorrow"    Plan Pending a return call from patient or follow up within the next 7-10 business days   Danial Hlavac L. [name], RN, BSN, [place] Coordinator Office number [phone] Main [facility] number [phone] Fax number [phone]

## 2021-08-27 ENCOUNTER — Telehealth: Payer: Self-pay | Admitting: Emergency Medicine

## 2021-08-27 NOTE — Telephone Encounter (Signed)
Spoke with pt who states she is currently taking nebulizer treatments  (Proventil, Brovana, Pulmicort,  Yupelri) as directed and singulair and flonase as well. Pt states understanding of medication regimen. Pt verified upcoming OV as well. Nothing further needed at this time.

## 2021-08-27 NOTE — Telephone Encounter (Signed)
Primary Pulmonologist: Dr. Lamonte Sakai Last office visit and with whom: 03/12/21 with Tammy parrett What do we see them for (pulmonary problems): ILD, Asthma-COPD overlap, GERD and Allergic Rhinitis Last OV assessment/plan: see below  Was appointment offered to patient (explain)?    Asthma-COPD overlap syndrome ([place]) Recent exacerbation requiring hospitalization.  Patient is improving.  Suspect her inability to afford her maintenance inhalers is contributing to recent exacerbation and ongoing symptoms. Patient was given 2 samples of her triple therapy inhaler. Will send an order to local homecare company to see if nebulized medications will be more affordable.  We will begin budesonide and Brovana twice daily and try Yupelri daily.  This will be filed through DME on Medicare B. If unable to afford nebulizers on return visit would consider changing her to [facility] which may be more affordable and adding DuoNeb 3 times daily. Continue on trigger prevention   Plan  Patient Instructions  Continue on BREZTRI 2 puffs Twice daily  , rinse after use. (samples x 2 given)    Send order for new nebs : Budesonide and Brovana Nebs Twice daily  And [employer] daily .  Albuterol inhaler or neb As needed   Mucinex DM Twice daily  As needed  Cough/congestion  Continue on Zyrtec 10mg  At bedtime  .  Continue on Singulair 10mg  daily .  Follow up with Dr. Lamonte Sakai  Or  Parrett [name] in -6   weeks and As needed  Please contact office for sooner follow up if symptoms do not improve or worsen or seek emergency care       Gastroesophageal reflux disease without esophagitis Continue on GERD therapy and diet   Allergic rhinitis Continue on current regimen   ILD (interstitial lung disease) ([place]) Patient with mild interstitial changes on CT scan with upper lobe predominance. We will continue to follow.  Consider PFTs later on this year.     [name] Edison, [name] 03/12/2021    Reason for call: I called and spoke with  patient regarding message. Patient stated started wheezing and coughing which is normal for her around this time around. Breathing is same, no fever,chills or body aches and no chest tightness. Patient is using nebs as prescribed, ran out of Breztri and cannot afford it and asked for samples. I have left 2 samples upfront. Patient is requesting an appt with either [place] or Tammy. Have made an appt with tammy for Wednesday, patient will take at home covid test before coming in and let us know if it is positive. Will route to Dr. Lamonte Sakai for any additional recs.  Dr. Lamonte Sakai, please advise. Thanks!   (examples of things to ask: : When did symptoms start? Fever? Cough? Productive? Color to sputum? More sputum than usual? Wheezing? Have you needed increased oxygen? Are you taking your respiratory medications? What over the counter measures have you tried?)  Allergies  Allergen Reactions   Penicillin G     Other reaction(s): rash Other reaction(s): rash Other reaction(s): rash Other reaction(s): rash   Pravastatin Other (See Comments)    Other reaction(s): leg cramps Other reaction(s): leg cramps Other reaction(s): leg cramps Other reaction(s): leg cramps   Penicillins Itching and Rash    Did it involve swelling of the face/tongue/throat, SOB, or low BP? N Did it involve sudden or severe rash/hives, skin peeling, or any reaction on the inside of your mouth or nose? Y Did you need to seek medical attention at a hospital or doctor's office? N When did it  last happen? Decades Ago      If all above answers are "NO", may proceed with cephalosporin use.      Immunization History  Administered Date(s) Administered   DTaP 03/20/2009   Fluad Quad(high Dose 65+) 09/16/2019   Influenza Split 11/10/2011, 10/10/2012, 09/17/2018   Influenza Whole 10/19/2006, 09/03/2008, 10/05/2009, 09/07/2010   Influenza, High Dose Seasonal PF 08/27/2014, 09/15/2016, 09/05/2017   Influenza, Quadrivalent, Recombinant,  Inj, Pf 08/21/2017, 09/17/2018, 09/16/2019, 08/27/2020   Influenza,inj,Quad PF,6+ Mos 09/11/2013, 08/08/2014, 09/14/2015, 09/05/2016   Influenza-Unspecified 10/05/2018   Moderna SARS-COV2 Booster Vaccination 09/29/2020   Moderna Sars-Covid-2 Vaccination 01/27/2020, 02/25/2020   Pneumococcal Conjugate-13 11/05/2014   Pneumococcal Polysaccharide-23 03/01/2006, 12/29/2016   Td 03/20/2009   Zoster, Live 10/19/2006

## 2021-08-27 NOTE — Telephone Encounter (Signed)
Thank you - agree with her coming in to be seen. She has significant upper airway sensitivity, probably worsened now by evolving fall allergy season. Would make sure she is on any prescribed allergy regimen reliably in prep for appt w TP

## 2021-09-01 ENCOUNTER — Telehealth: Payer: Self-pay | Admitting: Primary Care

## 2021-09-01 ENCOUNTER — Other Ambulatory Visit: Payer: Self-pay

## 2021-09-01 ENCOUNTER — Other Ambulatory Visit (HOSPITAL_COMMUNITY): Payer: Self-pay

## 2021-09-01 ENCOUNTER — Ambulatory Visit (INDEPENDENT_AMBULATORY_CARE_PROVIDER_SITE_OTHER): Payer: Medicare Other | Admitting: Primary Care

## 2021-09-01 ENCOUNTER — Other Ambulatory Visit: Payer: Self-pay | Admitting: *Deleted

## 2021-09-01 ENCOUNTER — Encounter: Payer: Self-pay | Admitting: Primary Care

## 2021-09-01 VITALS — BP 120/64 | HR 109 | Temp 98.0°F | Ht 62.0 in | Wt 157.0 lb

## 2021-09-01 DIAGNOSIS — B379 Candidiasis, unspecified: Secondary | ICD-10-CM | POA: Diagnosis not present

## 2021-09-01 DIAGNOSIS — J4541 Moderate persistent asthma with (acute) exacerbation: Secondary | ICD-10-CM | POA: Diagnosis not present

## 2021-09-01 DIAGNOSIS — J449 Chronic obstructive pulmonary disease, unspecified: Secondary | ICD-10-CM | POA: Diagnosis not present

## 2021-09-01 MED ORDER — METHYLPREDNISOLONE ACETATE 80 MG/ML IJ SUSP
80.0000 mg | Freq: Once | INTRAMUSCULAR | Status: AC
Start: 2021-09-01 — End: 2021-09-01
  Administered 2021-09-01: 80 mg via INTRAMUSCULAR

## 2021-09-01 MED ORDER — DOXYCYCLINE HYCLATE 100 MG PO TABS: 100.0000 mg | ORAL_TABLET | Freq: Two times a day (BID) | ORAL | 0 refills | Status: DC

## 2021-09-01 MED ORDER — PREDNISONE 10 MG PO TABS: ORAL_TABLET | ORAL | 0 refills | Status: DC

## 2021-09-01 MED ORDER — GUAIFENESIN ER 600 MG PO TB12: 600.0000 mg | ORAL_TABLET | Freq: Two times a day (BID) | ORAL | 0 refills | Status: AC

## 2021-09-01 MED ORDER — FLUCONAZOLE 150 MG PO TABS: 150.0000 mg | ORAL_TABLET | Freq: Once | ORAL | 0 refills | Status: AC

## 2021-09-01 MED ORDER — CETIRIZINE HCL 10 MG PO TABS: 10.0000 mg | ORAL_TABLET | Freq: Every day | ORAL | 5 refills | Status: DC

## 2021-09-01 NOTE — Progress Notes (Signed)
@Patient  ID: [name], female    DOB: [birth year], 75 y.o.   MRN: [id number]  Chief Complaint  Patient presents with   Follow-up    Patient feels that her cough is better and she is having shortness of breath,     Referring provider: Merrilee Seashore, MD  HPI: 75 year old female, former smoker quit 1996 (3-pack-year history).  Past medical history significant for asthma/COPD overlap syndrome, centrilobular emphysema, ILD.  Patient of Dr. Lamonte Sakai, last seen by pulmonary nurse practitioner on 03/12/2021.  09/01/2021 Patient presents today for acute office visit.  She developed cough 2 weeks ago with associated wheezing. She has history of upper airway sensitivity, symptoms worse with allergy season. She is compliant with Breztri Aerosphere two puffs twice daily and is using Albuterol nebulizer twice a day. She is taking Flonase nasal spray and Singulair 10mg  at bedtime. She can not afford Breztri prescription and did not qualify for patient assistance. From what I can tell she is not taking and has not received Brovana, Pulmicort or Yupelri nebulizers.    Allergies  Allergen Reactions   Penicillin G Rash    Other reaction(s): rash Other reaction(s): rash Other reaction(s): rash Other reaction(s): rash   Penicillins Itching and Rash    Did it involve swelling of the face/tongue/throat, SOB, or low BP? N Did it involve sudden or severe rash/hives, skin peeling, or any reaction on the inside of your mouth or nose? Y Did you need to seek medical attention at a hospital or doctor's office? N When did it last happen? Decades Ago      If all above answers are "NO", may proceed with cephalosporin use.     Pravastatin Other (See Comments)    Other reaction(s): leg cramps Other reaction(s): leg cramps Other reaction(s): leg cramps Other reaction(s): leg cramps    Immunization History  Administered Date(s) Administered   DTaP 03/20/2009   Fluad Quad(high Dose 65+) 09/16/2019    Influenza Split 11/10/2011, 10/10/2012, 09/17/2018   Influenza Whole 10/19/2006, 09/03/2008, 10/05/2009, 09/07/2010   Influenza, High Dose Seasonal PF 08/27/2014, 09/15/2016, 09/05/2017   Influenza, Quadrivalent, Recombinant, Inj, Pf 08/21/2017, 09/17/2018, 09/16/2019, 08/27/2020   Influenza,inj,Quad PF,6+ Mos 09/11/2013, 08/08/2014, 09/14/2015, 09/05/2016   Influenza-Unspecified 10/05/2018   Moderna SARS-COV2 Booster Vaccination 09/29/2020   Moderna Sars-Covid-2 Vaccination 01/27/2020, 02/25/2020   Pneumococcal Conjugate-13 11/05/2014   Pneumococcal Polysaccharide-23 03/01/2006, 12/29/2016   Td 03/20/2009   Zoster, Live 10/19/2006    Past Medical History:  Diagnosis Date   Allergic rhinitis    Allergy    seasonal allergies   Anemia    hx of   Asthma    on meds   Cataract    bilateral sx    Chronic bronchitis (HCC)    Colonic polyp    DDD (degenerative disc disease), lumbar 07/20/2011   DJD (degenerative joint disease) of knee    bilateral   DJD (degenerative joint disease), lumbar 07/20/2011   Esophageal stricture    GERD (gastroesophageal reflux disease)    on meds   Hiatal hernia    Hyperlipidemia    on meds   Osteoporosis    Stroke [facility]) 2017   "didn't know I'd had one before I was told; didn't do any damage" (11/24/2016)   Vocal cord dysfunction     Tobacco History: Social History   Tobacco Use  Smoking Status Former   Packs/day: 0.10   Years: 30.00   Pack years: 3.00   Types: Cigarettes   Start date: 01/31/1975  Quit date: 08/09/1995   Years since quitting: 26.0  Smokeless Tobacco Never   Counseling given: Not Answered   Outpatient Medications Prior to Visit  Medication Sig Dispense Refill   albuterol (PROVENTIL) (2.5 MG/3ML) 0.083% nebulizer solution Take 3 mLs (2.5 mg total) by nebulization every 4 (four) hours as needed for shortness of breath. 300 mL 3   arformoterol (BROVANA) 15 MCG/2ML NEBU Take 2 mLs (15 mcg total) by nebulization 2 (two) times  daily. 180 mL 1   aspirin EC 81 MG tablet Take 81 mg by mouth daily.     Budeson-Glycopyrrol-Formoterol (BREZTRI AEROSPHERE) 160-9-4.8 MCG/ACT AERO Inhale 2 puffs into the lungs in the morning and at bedtime. 10.7 g 11   Budeson-Glycopyrrol-Formoterol (BREZTRI AEROSPHERE) 160-9-4.8 MCG/ACT AERO Inhale 2 puffs into the lungs in the morning and at bedtime. 5.9 g 0   budesonide (PULMICORT) 0.25 MG/2ML nebulizer solution Take 2 mLs (0.25 mg total) by nebulization 2 (two) times daily. (Patient taking differently: Take 0.25 mg by nebulization 2 (two) times daily as needed.) 180 mL 1   CALCIUM-CHOLECALCIFEROL PO Take 1 tablet by mouth daily at 6 (six) AM.     cholecalciferol (VITAMIN D) 1000 UNITS tablet Take 1,000 Units by mouth daily.     fluticasone (FLONASE) 50 MCG/ACT nasal spray Place 1 spray into the nose 2 (two) times daily.     folic acid (FOLVITE) 1 MG tablet Take 1 tablet (1 mg total) by mouth daily. 90 tablet 3   gabapentin (NEURONTIN) 100 MG capsule Take 100 mg by mouth at bedtime.     hydroxyurea (HYDREA) 500 MG capsule Take 1 capsule (500 mg total) by mouth 3 (three) times daily before meals. 90 capsule 6   Magnesium 250 MG TABS Take 1 tablet by mouth daily.     montelukast (SINGULAIR) 10 MG tablet Take 1 tablet (10 mg total) by mouth at bedtime. 90 tablet 3   pantoprazole (PROTONIX) 40 MG tablet Take 1 tablet (40 mg total) by mouth 2 (two) times daily. Take 30- 60 min before your first and last meals of the day 180 tablet 3   pravastatin (PRAVACHOL) 40 MG tablet      promethazine-dextromethorphan (PROMETHAZINE-DM) 6.25-15 MG/5ML syrup Take 5 mLs by mouth 4 (four) times daily as needed for cough. 240 mL 0   revefenacin (YUPELRI) 175 MCG/3ML nebulizer solution Take 3 mLs (175 mcg total) by nebulization daily. 270 mL 1   rosuvastatin (CRESTOR) 10 MG tablet Take 10 mg by mouth at bedtime.     albuterol (PROVENTIL HFA) 108 (90 Base) MCG/ACT inhaler Inhale 2 puffs into the lungs every 4 (four)  hours as needed for wheezing or shortness of breath. Reported on 12/30/2015 8 g 1   Facility-Administered Medications Prior to Visit  Medication Dose Route Frequency Provider Last Rate Last Admin   0.9 %  sodium chloride infusion  500 mL Intravenous Once Irene Shipper, MD       Review of Systems  Review of Systems  Constitutional: Negative.   HENT:  Positive for congestion.   Respiratory:  Positive for cough and shortness of breath.   Cardiovascular: Negative.     Physical Exam  BP 120/64 (BP Location: Left Arm, Patient Position: Sitting, Cuff Size: Normal)   Pulse (!) 109   Temp 98 F (36.7 C) (Oral)   Ht 5\' 2"  (1.575 m)   Wt 157 lb (71.2 kg)   SpO2 100%   BMI 28.72 kg/m  Physical Exam Constitutional:  Appearance: Normal appearance.  HENT:     Head: Normocephalic and atraumatic.     Mouth/Throat:     Comments: Deferred d/t masking Cardiovascular:     Rate and Rhythm: Normal rate and regular rhythm.  Pulmonary:     Breath sounds: Wheezing and rhonchi present.  Skin:    General: Skin is warm and dry.  Neurological:     General: No focal deficit present.     Mental Status: She is alert and oriented to person, place, and time. Mental status is at baseline.  Psychiatric:        Mood and Affect: Mood normal.        Behavior: Behavior normal.        Thought Content: Thought content normal.        Judgment: Judgment normal.     Lab Results:  CBC    Component Value Date/Time   WBC 9.0 02/24/2021 0530   RBC 4.47 02/24/2021 0530   HGB 12.2 02/24/2021 0530   HGB 11.3 (L) 09/07/2020 0838   HGB 12.4 11/24/2017 0754   HCT 37.6 02/24/2021 0530   HCT 37.8 11/24/2017 0754   PLT 475 (H) 02/24/2021 0530   PLT 540 (H) 09/07/2020 0838   PLT 786 (H) 11/24/2017 0754   MCV 84.1 02/24/2021 0530   MCV 75 (L) 11/24/2017 0754   MCH 27.3 02/24/2021 0530   MCHC 32.4 02/24/2021 0530   RDW 16.1 (H) 02/24/2021 0530   RDW 16.7 (H) 11/24/2017 0754   LYMPHSABS 1.4 02/24/2021 0530    LYMPHSABS 1.9 11/24/2017 0754   MONOABS 0.6 02/24/2021 0530   EOSABS 0.0 02/24/2021 0530   EOSABS 0.2 11/24/2017 0754   BASOSABS 0.0 02/24/2021 0530   BASOSABS 0.1 11/24/2017 0754    BMET    Component Value Date/Time   NA 137 02/24/2021 0530   NA 145 11/24/2017 0754   NA 142 05/25/2017 0755   K 4.2 02/24/2021 0530   K 3.7 11/24/2017 0754   K 3.9 05/25/2017 0755   CL 101 02/24/2021 0530   CL 104 11/24/2017 0754   CO2 26 02/24/2021 0530   CO2 27 11/24/2017 0754   CO2 24 05/25/2017 0755   GLUCOSE 133 (H) 02/24/2021 0530   GLUCOSE 123 (H) 11/24/2017 0754   BUN 30 (H) 02/24/2021 0530   BUN 12 11/24/2017 0754   BUN 13.3 05/25/2017 0755   CREATININE 1.01 (H) 02/24/2021 0530   CREATININE 0.99 09/07/2020 0838   CREATININE 1.0 11/24/2017 0754   CREATININE 0.8 05/25/2017 0755   CALCIUM 9.0 02/24/2021 0530   CALCIUM 9.1 11/24/2017 0754   CALCIUM 9.5 05/25/2017 0755   GFRNONAA 58 (L) 02/24/2021 0530   GFRNONAA 56 (L) 09/07/2020 0838   GFRAA >60 09/07/2020 0838    BNP    Component Value Date/Time   BNP 30.5 07/06/2020 1800    ProBNP    Component Value Date/Time   PROBNP 102 06/16/2020 0901   PROBNP 61.1 09/01/2012 1912    Imaging: No results found.   Assessment & Plan:   Asthma-COPD overlap syndrome ([place]) - Treating for acute exacerbation COPD/asthma. She has had a productive cough x 2 weeks with associated wheezing. On exam she has scattered rhonchi and wheezing. Patient was given 80mg  IM depomedrol in office today. Sending in RX doxycycline 100mg  1 tab twice daily x 7 days and prednisone taper. Recommend she start mucinex 600mg  BID and cetirizine 10mg . Continue Breztri Aerosphere two puffs twice daily for now, I will reach out  to our pharmacy team to help find affordable inhaler regimen.  Follow-up in 2 weeks with APP or Dr. Lamonte Sakai.   Candidiasis - At risk for vaginal candidiasis with abx or oral steroid. Sending in [place] 150mg  x [place],  NP 09/01/2021

## 2021-09-01 NOTE — Telephone Encounter (Signed)
Breztri- copay for 1 month is $97.47. Patient has a $435 unmet deductible. Once met her copay will be $47.00  Trelegy- copay for 1 month is $99.46. Patient has a $435 unmet deductible. Once met her copay will be $47.00  Do we know the reason she was denied PAP?

## 2021-09-01 NOTE — Assessment & Plan Note (Addendum)
-   Treating for acute exacerbation COPD/asthma. She has had a productive cough x 2 weeks with associated wheezing. On exam she has scattered rhonchi and wheezing. Patient was given 80mg  IM depomedrol in office today. Sending in RX doxycycline 100mg  1 tab twice daily x 7 days and prednisone taper. Recommend she start mucinex 600mg  BID and cetirizine 10mg . Continue Breztri Aerosphere two puffs twice daily for now, I will reach out to our pharmacy team to help find affordable inhaler regimen.  Follow-up in 2 weeks with APP or Dr. Lamonte Sakai.

## 2021-09-01 NOTE — Patient Instructions (Addendum)
Recommendations: Continue Breztri two puffs morning and evening Use Albuterol nebulizer every 6 hours for breakthrough shortness of breath/wheezing  Start Zyrtec 10mg  daily  Start mucinex 600mg  twice a day   Orders:  Depo-medrol 80mg  IM x 1 today   Rx: Doxycycline 1 tab twice daily x 7 days  Prednisone taper as directed start tomorrow  Cetrizine (zyrtec) 10mg  daily   Follow-up: 2 weeks in office with Eustaquio Maize NP

## 2021-09-01 NOTE — Patient Outreach (Signed)
[facility]) Care Management  09/01/2021  [name] [birth year] [id number]   [facility] Unsuccessful outreach  [name] was referred to [facility] on 02/25/21 by [facility] hospital liaison Referral Reason: for post hospital/complex care services Pt previously with [facility] disease management [facility] prior to admission   Insurance: united healthcare medicare [employer] /blue cross and blue shield state PPO   Outreach attempt to the home number [phone] No answer. [facility] RN CM unable to leave a voice message   Plan: [facility] RN CM scheduled this patient for another call attempt within 4-7 business days Unsuccessful outreach on 09/01/21   Chibuikem Thang L. [name], RN, BSN, [place] Coordinator Office number [phone] Mobile number [phone]  Main [facility] number [phone] Fax number [phone]

## 2021-09-01 NOTE — Assessment & Plan Note (Signed)
-   At risk for vaginal candidiasis with abx or oral steroid. Sending in RX diflucan 150mg  x 1

## 2021-09-01 NOTE — Telephone Encounter (Signed)
She can not afford Breztri and did not qualify for patient assistance. Can you recommend affordable options for her?

## 2021-09-02 NOTE — Telephone Encounter (Signed)
Thanks

## 2021-09-02 NOTE — Telephone Encounter (Signed)
She verbally told me she did not qualify but here's hoping!

## 2021-09-02 NOTE — Telephone Encounter (Signed)
Called [employer] to determine why patient was denied Breztri through PAP. Per rep, they do not have patient in their system and no application was received.  Routing to triage team to initiate [employer] patient assistance for Tommas Olp, PharmD, MPH, BCPS Clinical Pharmacist (Rheumatology and Pulmonology)

## 2021-09-02 NOTE — Telephone Encounter (Signed)
Called and spoke with patient to let her know about message from pharmacy team. Asked her if she wanted me to mail her the patient assistance application since they don't have one on file, she said yes. I verified her address, paperwork mailed. Advised her to fill out the first page and then either bring it back to the office or mail it back so that we can fill out provider part and fax it in for her. Patient expressed understanding. Nothing further needed at this time.

## 2021-09-03 ENCOUNTER — Inpatient Hospital Stay (HOSPITAL_COMMUNITY)
Admission: EM | Admit: 2021-09-03 | Discharge: 2021-09-06 | DRG: 191 | Disposition: A | Discharge status: Home / Self Care | Payer: Medicare Other | Attending: Family Medicine | Admitting: Family Medicine

## 2021-09-03 ENCOUNTER — Other Ambulatory Visit: Payer: Self-pay

## 2021-09-03 ENCOUNTER — Telehealth: Payer: Self-pay | Admitting: Primary Care

## 2021-09-03 ENCOUNTER — Emergency Department (HOSPITAL_COMMUNITY): Payer: Medicare Other

## 2021-09-03 DIAGNOSIS — J45901 Unspecified asthma with (acute) exacerbation: Secondary | ICD-10-CM | POA: Diagnosis present

## 2021-09-03 DIAGNOSIS — D573 Sickle-cell trait: Secondary | ICD-10-CM | POA: Diagnosis present

## 2021-09-03 DIAGNOSIS — Z79899 Other long term (current) drug therapy: Secondary | ICD-10-CM | POA: Diagnosis not present

## 2021-09-03 DIAGNOSIS — J45909 Unspecified asthma, uncomplicated: Secondary | ICD-10-CM | POA: Diagnosis not present

## 2021-09-03 DIAGNOSIS — M170 Bilateral primary osteoarthritis of knee: Secondary | ICD-10-CM | POA: Diagnosis not present

## 2021-09-03 DIAGNOSIS — Z8371 Family history of colonic polyps: Secondary | ICD-10-CM | POA: Diagnosis not present

## 2021-09-03 DIAGNOSIS — M810 Age-related osteoporosis without current pathological fracture: Secondary | ICD-10-CM | POA: Diagnosis not present

## 2021-09-03 DIAGNOSIS — Z800 Family history of malignant neoplasm of digestive organs: Secondary | ICD-10-CM | POA: Diagnosis not present

## 2021-09-03 DIAGNOSIS — J849 Interstitial pulmonary disease, unspecified: Secondary | ICD-10-CM | POA: Diagnosis present

## 2021-09-03 DIAGNOSIS — E782 Mixed hyperlipidemia: Secondary | ICD-10-CM | POA: Diagnosis present

## 2021-09-03 DIAGNOSIS — J449 Chronic obstructive pulmonary disease, unspecified: Secondary | ICD-10-CM

## 2021-09-03 DIAGNOSIS — Z8249 Family history of ischemic heart disease and other diseases of the circulatory system: Secondary | ICD-10-CM | POA: Diagnosis not present

## 2021-09-03 DIAGNOSIS — R7303 Prediabetes: Secondary | ICD-10-CM | POA: Diagnosis not present

## 2021-09-03 DIAGNOSIS — R0602 Shortness of breath: Secondary | ICD-10-CM | POA: Diagnosis not present

## 2021-09-03 DIAGNOSIS — Z20822 Contact with and (suspected) exposure to covid-19: Secondary | ICD-10-CM | POA: Diagnosis present

## 2021-09-03 DIAGNOSIS — B379 Candidiasis, unspecified: Secondary | ICD-10-CM | POA: Diagnosis present

## 2021-09-03 DIAGNOSIS — Z880 Allergy status to penicillin: Secondary | ICD-10-CM

## 2021-09-03 DIAGNOSIS — Z87891 Personal history of nicotine dependence: Secondary | ICD-10-CM | POA: Diagnosis not present

## 2021-09-03 DIAGNOSIS — K449 Diaphragmatic hernia without obstruction or gangrene: Secondary | ICD-10-CM | POA: Diagnosis present

## 2021-09-03 DIAGNOSIS — M059 Rheumatoid arthritis with rheumatoid factor, unspecified: Secondary | ICD-10-CM | POA: Diagnosis present

## 2021-09-03 DIAGNOSIS — D693 Immune thrombocytopenic purpura: Secondary | ICD-10-CM | POA: Diagnosis not present

## 2021-09-03 DIAGNOSIS — J441 Chronic obstructive pulmonary disease with (acute) exacerbation: Principal | ICD-10-CM | POA: Diagnosis present

## 2021-09-03 DIAGNOSIS — J4541 Moderate persistent asthma with (acute) exacerbation: Secondary | ICD-10-CM | POA: Diagnosis not present

## 2021-09-03 DIAGNOSIS — Z888 Allergy status to other drugs, medicaments and biological substances status: Secondary | ICD-10-CM

## 2021-09-03 DIAGNOSIS — K222 Esophageal obstruction: Secondary | ICD-10-CM | POA: Diagnosis present

## 2021-09-03 DIAGNOSIS — Z7982 Long term (current) use of aspirin: Secondary | ICD-10-CM | POA: Diagnosis not present

## 2021-09-03 DIAGNOSIS — Z825 Family history of asthma and other chronic lower respiratory diseases: Secondary | ICD-10-CM | POA: Diagnosis not present

## 2021-09-03 DIAGNOSIS — Z7951 Long term (current) use of inhaled steroids: Secondary | ICD-10-CM

## 2021-09-03 DIAGNOSIS — Z8673 Personal history of transient ischemic attack (TIA), and cerebral infarction without residual deficits: Secondary | ICD-10-CM

## 2021-09-03 DIAGNOSIS — K219 Gastro-esophageal reflux disease without esophagitis: Secondary | ICD-10-CM | POA: Diagnosis present

## 2021-09-03 DIAGNOSIS — J309 Allergic rhinitis, unspecified: Secondary | ICD-10-CM | POA: Diagnosis not present

## 2021-09-03 DIAGNOSIS — M069 Rheumatoid arthritis, unspecified: Secondary | ICD-10-CM | POA: Diagnosis present

## 2021-09-03 LAB — COMPREHENSIVE METABOLIC PANEL
ALT: 22 U/L (ref 0–44)
AST: 22 U/L (ref 15–41)
Albumin: 3.6 g/dL (ref 3.5–5.0)
Alkaline Phosphatase: 62 U/L (ref 38–126)
Anion gap: 8 (ref 5–15)
BUN: 14 mg/dL (ref 8–23)
CO2: 26 mmol/L (ref 22–32)
Calcium: 9.1 mg/dL (ref 8.9–10.3)
Chloride: 107 mmol/L (ref 98–111)
Creatinine, Ser: 0.84 mg/dL (ref 0.44–1.00)
GFR, Estimated: >60 mL/min (ref >60)
Glucose, Bld: 186 mg/dL — ABNORMAL HIGH (ref 70–99)
Potassium: 4.2 mmol/L (ref 3.5–5.1)
Sodium: 141 mmol/L (ref 135–145)
Total Bilirubin: 0.5 mg/dL (ref 0.3–1.2)
Total Protein: 6.6 g/dL (ref 6.5–8.1)

## 2021-09-03 LAB — CBC WITH DIFFERENTIAL/PLATELET
Abs Immature Granulocytes: 0.1 10*3/uL — ABNORMAL HIGH (ref 0.00–0.07)
Basophils Absolute: 0 10*3/uL (ref 0.0–0.1)
Basophils Relative: 0 %
Eosinophils Absolute: 0 10*3/uL (ref 0.0–0.5)
Eosinophils Relative: 0 %
HCT: 41.2 % (ref 36.0–46.0)
Hemoglobin: 13.1 g/dL (ref 12.0–15.0)
Immature Granulocytes: 1 %
Lymphocytes Relative: 5 %
Lymphs Abs: 0.4 10*3/uL — ABNORMAL LOW (ref 0.7–4.0)
MCH: 28.3 pg (ref 26.0–34.0)
MCHC: 31.8 g/dL (ref 30.0–36.0)
MCV: 89 fL (ref 80.0–100.0)
Monocytes Absolute: 0.1 10*3/uL (ref 0.1–1.0)
Monocytes Relative: 1 %
Neutro Abs: 7.7 10*3/uL (ref 1.7–7.7)
Neutrophils Relative %: 93 %
Platelets: 333 10*3/uL (ref 150–400)
RBC: 4.63 MIL/uL (ref 3.87–5.11)
RDW: 18.9 % — ABNORMAL HIGH (ref 11.5–15.5)
WBC: 8.4 10*3/uL (ref 4.0–10.5)
nRBC: 0 % (ref 0.0–0.2)

## 2021-09-03 LAB — RESP PANEL BY RT-PCR (FLU A&B, COVID) ARPGX2
Influenza A by PCR: NEGATIVE
Influenza B by PCR: NEGATIVE
SARS Coronavirus 2 by RT PCR: NEGATIVE

## 2021-09-03 MED ORDER — ACETAMINOPHEN 325 MG PO TABS
650.0000 mg | ORAL_TABLET | Freq: Four times a day (QID) | ORAL | Status: DC | PRN
  Administered 2021-09-05: 650 mg via ORAL
  Filled 2021-09-03: qty 2

## 2021-09-03 MED ORDER — ALBUTEROL SULFATE (2.5 MG/3ML) 0.083% IN NEBU
2.5000 mg | INHALATION_SOLUTION | RESPIRATORY_TRACT | Status: DC | PRN
  Administered 2021-09-04 (×2): 2.5 mg via RESPIRATORY_TRACT
  Filled 2021-09-03 (×2): qty 3

## 2021-09-03 MED ORDER — ONDANSETRON HCL 4 MG PO TABS: 4.0000 mg | ORAL_TABLET | Freq: Four times a day (QID) | ORAL | Status: DC | PRN

## 2021-09-03 MED ORDER — PREDNISONE 20 MG PO TABS
40.0000 mg | ORAL_TABLET | Freq: Every day | ORAL | Status: DC
  Administered 2021-09-04: 40 mg via ORAL
  Filled 2021-09-03: qty 2

## 2021-09-03 MED ORDER — ACETAMINOPHEN 650 MG RE SUPP: 650.0000 mg | Freq: Four times a day (QID) | RECTAL | Status: DC | PRN

## 2021-09-03 MED ORDER — AZITHROMYCIN 500 MG IV SOLR
500.0000 mg | INTRAVENOUS | Status: AC
  Administered 2021-09-04: 500 mg via INTRAVENOUS
  Filled 2021-09-03: qty 500

## 2021-09-03 MED ORDER — METHYLPREDNISOLONE SODIUM SUCC 125 MG IJ SOLR
125.0000 mg | Freq: Once | INTRAMUSCULAR | Status: AC
  Administered 2021-09-03: 125 mg via INTRAVENOUS
  Filled 2021-09-03: qty 2

## 2021-09-03 MED ORDER — MAGNESIUM SULFATE 2 GM/50ML IV SOLN
2.0000 g | Freq: Once | INTRAVENOUS | Status: AC
  Administered 2021-09-03: 2 g via INTRAVENOUS
  Filled 2021-09-03: qty 50

## 2021-09-03 MED ORDER — ONDANSETRON HCL 4 MG/2ML IJ SOLN: 4.0000 mg | Freq: Four times a day (QID) | INTRAMUSCULAR | Status: DC | PRN

## 2021-09-03 MED ORDER — ALBUTEROL (5 MG/ML) CONTINUOUS INHALATION SOLN
10.0000 mg/h | INHALATION_SOLUTION | Freq: Once | RESPIRATORY_TRACT | Status: AC
Start: 2021-09-03 — End: 2021-09-03
  Administered 2021-09-03: 10 mg/h via RESPIRATORY_TRACT
  Filled 2021-09-03: qty 20

## 2021-09-03 MED ORDER — ENOXAPARIN SODIUM 40 MG/0.4ML IJ SOSY
40.0000 mg | PREFILLED_SYRINGE | INTRAMUSCULAR | Status: DC
  Administered 2021-09-04 – 2021-09-06 (×3): 40 mg via SUBCUTANEOUS
  Filled 2021-09-03 (×3): qty 0.4

## 2021-09-03 MED ORDER — UMECLIDINIUM BROMIDE 62.5 MCG/INH IN AEPB
1.0000 | INHALATION_SPRAY | Freq: Every day | RESPIRATORY_TRACT | Status: DC
  Administered 2021-09-05 – 2021-09-06 (×2): 1 via RESPIRATORY_TRACT
  Filled 2021-09-03: qty 7

## 2021-09-03 MED ORDER — MOMETASONE FURO-FORMOTEROL FUM 200-5 MCG/ACT IN AERO
2.0000 | INHALATION_SPRAY | Freq: Two times a day (BID) | RESPIRATORY_TRACT | Status: DC
  Administered 2021-09-04 – 2021-09-06 (×4): 2 via RESPIRATORY_TRACT
  Filled 2021-09-03: qty 8.8

## 2021-09-03 MED ORDER — ALBUTEROL SULFATE (2.5 MG/3ML) 0.083% IN NEBU
10.0000 mg/h | INHALATION_SOLUTION | Freq: Once | RESPIRATORY_TRACT | Status: AC
  Administered 2021-09-03: 10 mg/h via RESPIRATORY_TRACT
  Filled 2021-09-03: qty 3

## 2021-09-03 MED ORDER — AZITHROMYCIN 250 MG PO TABS
500.0000 mg | ORAL_TABLET | Freq: Every day | ORAL | Status: DC
  Administered 2021-09-05 – 2021-09-06 (×2): 500 mg via ORAL
  Filled 2021-09-03 (×2): qty 2

## 2021-09-03 NOTE — Telephone Encounter (Signed)
Nothing additional out-patient we can do. If her symptoms are worse advised she go to ED. Needs CXR and might need to be admitted

## 2021-09-03 NOTE — ED Provider Notes (Signed)
  Face-to-face evaluation   History: She presents for persistent shortness of breath for 2 weeks despite treatment with usual medicines and a consultation with her pulmonologist, who gave her doxycycline.  Physical exam: Elderly, alert, sitting in tripod position.  Increased respiratory rate, decreased air movement bilaterally with scattered rhonchi.  Minimal wheezing noted but prolonged expiratory air movement.  Mild increased work of breathing.  No leg edema.  Medical screening examination/treatment/procedure(s) were conducted as a shared visit with non-physician practitioner(s) and myself.  I personally evaluated the patient during the encounter   .Critical Care Performed by: Daleen Bo, MD Authorized by: Daleen Bo, MD   Critical care provider statement:    Critical care time (minutes):  35   Critical care start time:  09/03/2021 9:00 PM   Critical care end time:  09/03/2021 11:32 PM   Critical care time was exclusive of:  Separately billable procedures and treating other patients   Critical care was necessary to treat or prevent imminent or life-threatening deterioration of the following conditions:  Respiratory failure   Critical care was time spent personally by me on the following activities:  Blood draw for specimens, development of treatment plan with patient or surrogate, discussions with consultants, evaluation of patient's response to treatment, examination of patient, obtaining history from patient or surrogate, ordering and performing treatments and interventions, ordering and review of laboratory studies, pulse oximetry, re-evaluation of patient's condition, review of old charts and ordering and review of radiographic studies   11:07 PM-Consult complete with hospitalist. Patient case explained and discussed.  He agrees to admit patient for further evaluation and treatment. Call ended at 11:20 PM   Daleen Bo, MD 09/03/21 2332

## 2021-09-03 NOTE — ED Triage Notes (Signed)
Pt came from home via POV. C/c: asthma exacerbation. Pt saw PCP on Wednesday, prescribed prednisone, nebulizer tx, mucinex, and antibx without relief. Productive cough with clear/ brown mucus production x 2 weeks. Pt denies N/V/D.

## 2021-09-03 NOTE — Telephone Encounter (Signed)
Called and spoke with pt letting her know recs per [facility] and she verbalized understanding. Nothing further needed.

## 2021-09-03 NOTE — Telephone Encounter (Signed)
Called and spoke with pt and she stated that she was seen by EW on 09/28.  She was given doxy.  She stated that she feels worse than she did when she was seen 2 days ago.  She has been taking the doxy.  She stated that she is [facility] when she is walking across the room at home.  She is calling to get further recs   EW please advise. Thanks

## 2021-09-03 NOTE — Telephone Encounter (Signed)
Called patient but call went straight to VM. Will attempt to call back later.

## 2021-09-03 NOTE — H&P (Signed)
History and Physical    [name] XLK:[id number] DOB: [birth year] DOA: 09/03/2021  PCP: Merrilee Seashore, MD  Patient coming from: Home  I have personally briefly reviewed patient's old medical records in [place]  Chief Complaint: Wheezing  HPI: [name] is a 75 y.o. female with medical history significant of asthma / COPD overlap syndrome, chronic bronchitis.  Pt had onset of wheezing and SOB on Wed.  Went to PCP, started on prednisone.  Despite this symptoms have continued to progressively worsen.  Minimal relief with home nebs.  Cough is sometimes productive.  No fevers, chills, URI symptoms.  Has had COVID vaccine.     ED Course: COVID neg.  CXR neg.  Initially in respiratory distress.  Some improvement after solumedrol, nebs.  But still wheezing.   Review of Systems: As per HPI, otherwise all review of systems negative.  Past Medical History:  Diagnosis Date   Allergic rhinitis    Allergy    seasonal allergies   Anemia    hx of   Asthma    on meds   Cataract    bilateral sx    Chronic bronchitis (HCC)    Colonic polyp    DDD (degenerative disc disease), lumbar 07/20/2011   DJD (degenerative joint disease) of knee    bilateral   DJD (degenerative joint disease), lumbar 07/20/2011   Esophageal stricture    GERD (gastroesophageal reflux disease)    on meds   Hiatal hernia    Hyperlipidemia    on meds   Osteoporosis    Stroke [facility]) 2017   "didn't know I'd had one before I was told; didn't do any damage" (11/24/2016)   Vocal cord dysfunction     Past Surgical History:  Procedure Laterality Date   CARPAL TUNNEL RELEASE Bilateral    COLONOSCOPY  2017   JP-suprep(exc)=TA x 2   COLONOSCOPY W/ BIOPSIES AND POLYPECTOMY     ESOPHAGOGASTRODUODENOSCOPY (EGD) WITH ESOPHAGEAL DILATION     FOOT SURGERY Bilateral    "might have been bunions or hammertoes"   [place] ARTHROSCOPY Left 11/06/2013   POLYPECTOMY  2017    TA x 2   TONSILLECTOMY     TOTAL ABDOMINAL HYSTERECTOMY     VIDEO BRONCHOSCOPY Bilateral 12/12/2014   Procedure: VIDEO BRONCHOSCOPY WITHOUT FLUORO;  Surgeon: Chesley Mires, MD;  Location: [facility];  Service: Cardiopulmonary;  Laterality: Bilateral;   WISDOM TOOTH EXTRACTION       reports that she quit smoking about 26 years ago. Her smoking use included cigarettes. She started smoking about 46 years ago. She has a 3.00 pack-year smoking history. She has never used smokeless tobacco. She reports current alcohol use. She reports that she does not use drugs.  Allergies  Allergen Reactions   Penicillin G Rash    Other reaction(s): rash Other reaction(s): rash Other reaction(s): rash Other reaction(s): rash   Penicillins Itching and Rash    Did it involve swelling of the face/tongue/throat, SOB, or low BP? N Did it involve sudden or severe rash/hives, skin peeling, or any reaction on the inside of your mouth or nose? Y Did you need to seek medical attention at a hospital or doctor's office? N When did it last happen? Decades Ago      If all above answers are "NO", may proceed with cephalosporin use.     Pravastatin Other (See Comments)    Other reaction(s): leg cramps Other reaction(s): leg cramps Other reaction(s):  leg cramps Other reaction(s): leg cramps    Family History  Problem Relation Age of Onset   Heart attack Mother    Colon cancer Father 39   Colon polyps Father 25   Colon polyps Sister 36   Colon cancer Sister 76   Asthma Brother    Asthma Other        uncle   Colon polyps Daughter 52   Stomach cancer Neg Hx    Esophageal cancer Neg Hx    Rectal cancer Neg Hx      Prior to Admission medications   Medication Sig Start Date End Date Taking? Authorizing Provider  albuterol (PROVENTIL HFA) 108 (90 Base) MCG/ACT inhaler Inhale 2 puffs into the lungs every 4 (four) hours as needed for wheezing or shortness of breath. Reported on 12/30/2015 02/24/21 03/26/21  Elodia Florence., MD  albuterol (PROVENTIL) (2.5 MG/3ML) 0.083% nebulizer solution Take 3 mLs (2.5 mg total) by nebulization every 4 (four) hours as needed for shortness of breath. 02/24/21   Elodia Florence., MD  arformoterol (BROVANA) 15 MCG/2ML NEBU Take 2 mLs (15 mcg total) by nebulization 2 (two) times daily. 03/12/21   Parrett, Fonnie Mu, NP  aspirin EC 81 MG tablet Take 81 mg by mouth daily.    [provider]  Budeson-Glycopyrrol-Formoterol (BREZTRI AEROSPHERE) 160-9-4.8 MCG/ACT AERO Inhale 2 puffs into the lungs in the morning and at bedtime. 06/17/20   Collene Gobble, MD  Budeson-Glycopyrrol-Formoterol (BREZTRI AEROSPHERE) 160-9-4.8 MCG/ACT AERO Inhale 2 puffs into the lungs in the morning and at bedtime. 05/25/21   Collene Gobble, MD  budesonide (PULMICORT) 0.25 MG/2ML nebulizer solution Take 2 mLs (0.25 mg total) by nebulization 2 (two) times daily. Patient taking differently: Take 0.25 mg by nebulization 2 (two) times daily as needed. 03/12/21   Parrett, Fonnie Mu, NP  CALCIUM-CHOLECALCIFEROL PO Take 1 tablet by mouth daily at 6 (six) AM.    [provider]  cetirizine (ZYRTEC ALLERGY) 10 MG tablet Take 1 tablet (10 mg total) by mouth daily. 09/01/21   Martyn Ehrich, NP  cholecalciferol (VITAMIN D) 1000 UNITS tablet Take 1,000 Units by mouth daily.    [provider]  doxycycline (VIBRA-TABS) 100 MG tablet Take 1 tablet (100 mg total) by mouth 2 (two) times daily. 09/01/21   Martyn Ehrich, NP  fluticasone (FLONASE) 50 MCG/ACT nasal spray Place 1 spray into the nose 2 (two) times daily.    [provider]  folic acid (FOLVITE) 1 MG tablet Take 1 tablet (1 mg total) by mouth daily. 06/03/15   Celso Amy, NP  gabapentin (NEURONTIN) 100 MG capsule Take 100 mg by mouth at bedtime. 08/27/20   [provider]  guaiFENesin (MUCINEX) 600 MG 12 hr tablet Take 1 tablet (600 mg total) by mouth 2 (two) times daily for 14 days. 09/01/21 09/15/21  Martyn Ehrich, NP  hydroxyurea (HYDREA) 500 MG capsule Take 1 capsule (500 mg total) by mouth 3 (three) times daily before meals. 03/02/18   [name], MD  Magnesium 250 MG TABS Take 1 tablet by mouth daily.    [provider]  montelukast (SINGULAIR) 10 MG tablet Take 1 tablet (10 mg total) by mouth at bedtime. 03/11/21   Collene Gobble, MD  pantoprazole (PROTONIX) 40 MG tablet Take 1 tablet (40 mg total) by mouth 2 (two) times daily. Take 30- 60 min before your first and last meals of the day 03/11/21  Collene Gobble, MD  pravastatin (PRAVACHOL) 40 MG tablet  04/09/21   [provider]  predniSONE (DELTASONE) 10 MG tablet Take 4 tabs po daily x 3 days; then 3 tabs daily x3 days; then 2 tabs daily x3 days; then 1 tab daily x 3 days; then stop 09/01/21   Martyn Ehrich, NP  promethazine-dextromethorphan (PROMETHAZINE-DM) 6.25-15 MG/5ML syrup Take 5 mLs by mouth 4 (four) times daily as needed for cough. 07/08/20   Tanda Rockers, MD  revefenacin (YUPELRI) 175 MCG/3ML nebulizer solution Take 3 mLs (175 mcg total) by nebulization daily. 03/12/21   Parrett, Fonnie Mu, NP  rosuvastatin (CRESTOR) 10 MG tablet Take 10 mg by mouth at bedtime. 02/13/21   [provider]    Physical Exam: Vitals:   09/03/21 1939 09/03/21 2100 09/03/21 2130 09/03/21 2200  BP:  127/71 123/88 136/71  Pulse:  100 91 (!) 101  Resp:  (!) 21 (!) 21 19  Temp:      TempSrc:      SpO2: 97% 96% 100% 100%  Weight:      Height:        Constitutional: NAD, calm, appears uncomfortable but not in respiratory distress. Eyes: PERRL, lids and conjunctivae normal ENMT: Mucous membranes are moist. Posterior pharynx clear of any exudate or lesions.Normal dentition.  Neck: normal, supple, no masses, no thyromegaly Respiratory: Diffuse wheezing. Cardiovascular: Regular rate and rhythm, no murmurs / rubs / gallops. No extremity edema. 2+ pedal pulses. No carotid bruits.  Abdomen: no tenderness, no masses  palpated. No hepatosplenomegaly. Bowel sounds positive.  Musculoskeletal: no clubbing / cyanosis. No joint deformity upper and lower extremities. Good ROM, no contractures. Normal muscle tone.  Skin: no rashes, lesions, ulcers. No induration Neurologic: CN 2-12 grossly intact. Sensation intact, DTR normal. Strength 5/5 in all 4.  Psychiatric: Normal judgment and insight. Alert and oriented x 3. Normal mood.    Labs on Admission: I have personally reviewed following labs and imaging studies  CBC: Recent Labs  Lab 09/03/21 2146  WBC 8.4  NEUTROABS 7.7  HGB 13.1  HCT 41.2  MCV 89.0  PLT 097   Basic Metabolic Panel: Recent Labs  Lab 09/03/21 2146  NA 141  K 4.2  CL 107  CO2 26  GLUCOSE 186*  BUN 14  CREATININE 0.84  CALCIUM 9.1   GFR: Estimated Creatinine Clearance: 52.6 mL/min (by C-G formula based on SCr of 0.84 mg/dL). Liver Function Tests: Recent Labs  Lab 09/03/21 2146  AST 22  ALT 22  ALKPHOS 62  BILITOT 0.5  PROT 6.6  ALBUMIN 3.6   No results for input(s): LIPASE, AMYLASE in the last 168 hours. No results for input(s): AMMONIA in the last 168 hours. Coagulation Profile: No results for input(s): INR, PROTIME in the last 168 hours. Cardiac Enzymes: No results for input(s): CKTOTAL, CKMB, CKMBINDEX, TROPONINI in the last 168 hours. BNP (last 3 results) No results for input(s): PROBNP in the last 8760 hours. HbA1C: No results for input(s): HGBA1C in the last 72 hours. CBG: No results for input(s): GLUCAP in the last 168 hours. Lipid Profile: No results for input(s): CHOL, HDL, LDLCALC, TRIG, CHOLHDL, LDLDIRECT in the last 72 hours. Thyroid Function Tests: No results for input(s): TSH, T4TOTAL, FREET4, T3FREE, THYROIDAB in the last 72 hours. Anemia Panel: No results for input(s): VITAMINB12, FOLATE, FERRITIN, TIBC, IRON, RETICCTPCT in the last 72 hours. Urine analysis:    Component Value Date/Time   COLORURINE YELLOW 09/01/2012 2014  APPEARANCEUR  CLOUDY (A) 09/01/2012 2014   LABSPEC 1.021 09/01/2012 2014   PHURINE 5.5 09/01/2012 2014   GLUCOSEU NEGATIVE 09/01/2012 2014   GLUCOSEU NEGATIVE 01/11/2011 0950   HGBUR NEGATIVE 09/01/2012 2014   BILIRUBINUR NEGATIVE 09/01/2012 2014   [place] 09/01/2012 2014   PROTEINUR NEGATIVE 09/01/2012 2014   UROBILINOGEN 1.0 09/01/2012 2014   NITRITE NEGATIVE 09/01/2012 2014   LEUKOCYTESUR TRACE (A) 09/01/2012 2014    Radiological Exams on Admission: DG Chest Port 1 View  Result Date: 09/03/2021 CLINICAL DATA:  Short of breath, asthma exacerbation EXAM: PORTABLE CHEST 1 VIEW COMPARISON:  02/20/2021 FINDINGS: Single frontal view of the chest demonstrates a stable cardiac silhouette. Continued ectasia of the thoracic aorta. No airspace disease, effusion, or pneumothorax. IMPRESSION: 1. No acute intrathoracic process. Electronically Signed   By: Randa Ngo M.D.   On: 09/03/2021 22:51    EKG: Independently reviewed.  Assessment/Plan Principal Problem:   Asthma exacerbation Active Problems:   Mixed hyperlipidemia   Asthma-COPD overlap syndrome (HCC)   Seropositive rheumatoid arthritis (HCC)   History of stroke    Asthma exacerbation / COPD exacerbation - COPD pathway Empiric azithromycin Scheduled LABA/LAMA/inh steroid Prednisone PRN SABA Tele monitor ILD - Mild ILD on high res CT of lung previously No acute CXR findings today Following with pulm HLD - Cont statin Prior stroke - Cont ASA RA - Pt on MTX, takes on Mondays Will hold for the moment while we have her on steroids to treat acute asthma / COPD Essential thrombocytopenia, sickle cell trait - Cont folate, hydrea H/o Prediabetes - Mod scale SSI AC (suspect this will worsen with steroids).  DVT prophylaxis: Lovenox Code Status: Full Family Communication: No family in room Disposition Plan: Home after breathing improved Consults called: None Admission status: Place in 82    Camrynn Mcclintic, [place]  Hospitalists  How to contact the [facility] Attending or Consulting provider [place] or covering provider during after hours [place], for this patient?  Check the care team in [facility] and look for a) attending/consulting [facility] provider listed and b) the [facility] team listed Log into [email or web address]  Amion Physician Scheduling and messaging for groups and whole hospitals  On call and physician scheduling software for group practices, residents, hospitalists and other medical providers for call, clinic, rotation and shift schedules. On[employer] is a hospital-wide system for scheduling doctors and paging doctors on call. EasyPlot is for scientific plotting and data analysis.  [email or web address]  and use [place]'s universal password to access. If you do not have the password, please contact the hospital operator.  Locate the [facility] provider you are looking for under [facility] and page to a number that you can be directly reached. If you still have difficulty reaching the provider, please page the [facility] (Director on Call) for the Hospitalists listed on amion for assistance.  09/03/2021, 11:59 PM

## 2021-09-03 NOTE — ED Provider Notes (Signed)
[place] DEPT Provider Note   CSN: [id number] Arrival date & time: 09/03/21  1916     History No chief complaint on file.   [name] is a 75 y.o. female history of asthma, chronic bronchitis, interstitial lung disease.  Presents emergency department with a chief complaint of wheezing.  Patient reports that she started having wheezing and difficulty breathing on Wednesday.  Patient went to her primary care doctor and was started on prednisone.  Patient reports that despite prednisone treatment she has had continued and worsening wheezing and shortness of breath.  Patient reports that she has had minimal relief with at home albuterol and albuterol nebulizer treatments.  Patient endorses cough.  Cough is sometimes productive.  1 productive produces clear to white mucus.  Patient denies any fevers, chills, rhinorrhea, nasal congestion, sore throat, neck swelling or tenderness, hemoptysis.  Denies any known sick contacts.  Patient has been vaccinated for COVID-19 and received booster.  HPI     Past Medical History:  Diagnosis Date   Allergic rhinitis    Allergy    seasonal allergies   Anemia    hx of   Asthma    on meds   Cataract    bilateral sx    Chronic bronchitis (HCC)    Colonic polyp    DDD (degenerative disc disease), lumbar 07/20/2011   DJD (degenerative joint disease) of knee    bilateral   DJD (degenerative joint disease), lumbar 07/20/2011   Esophageal stricture    GERD (gastroesophageal reflux disease)    on meds   Hiatal hernia    Hyperlipidemia    on meds   Osteoporosis    Stroke [facility]) 2017   "didn't know I'd had one before I was told; didn't do any damage" (11/24/2016)   Vocal cord dysfunction     Patient Active Problem List   Diagnosis Date Noted   Candidiasis 09/01/2021   Chronic kidney disease, stage 2 (mild) 08/12/2021   Centrilobular emphysema ([place]) 04/13/2021   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/13/2021    Cramp and spasm 03/01/2021   Cramp in lower leg associated with rest 03/01/2021   Hardening of the aorta (main artery of the heart) ([place]) 03/01/2021   Leukopenia 03/01/2021   Menopause 03/01/2021   Mild intermittent asthma 03/01/2021   Multiple joint pain 03/01/2021   Obesity 03/01/2021   Seropositive rheumatoid arthritis ([place]) 03/01/2021   Severe major depression, single episode, without psychotic features ([place]) 03/01/2021   Moderate persistent asthma with (acute) exacerbation 02/22/2021   Asthma exacerbation 02/21/2021   Hyperglycemia 02/21/2021   Asthma-COPD overlap syndrome ([place]) 07/08/2020   On methotrexate therapy 07/10/2018   Abnormal laboratory test 03/29/2018   Sickle cell trait ([place]) 01/20/2017   JAK-2 gene mutation 12/06/2016   AKI (acute kidney injury) ([place]) 11/24/2016   Right shoulder pain 11/24/2016   Acute respiratory distress 11/24/2016   RLS (restless legs syndrome) 11/24/2016   Restless leg syndrome 10/13/2016   Primary osteoarthritis of left knee 02/18/2015   Shingles 12/19/2014   ILD (interstitial lung disease) ([place])    Atelectasis    Essential thrombocythemia ([place]) 12/24/2013   S/P left knee arthroscopy 11/11/2013   TIA (transient ischemic attack) 09/01/2012   Chest pain 09/01/2012   Arm numbness left 06/03/2012   Migraine 05/31/2012   Dyspnea on exertion 08/09/2011   DDD (degenerative disc disease), lumbar 07/20/2011   DJD (degenerative joint disease), lumbar 07/20/2011   CVA (cerebral infarction) 07/20/2011   Syncope and collapse  07/12/2011   Acute lumbar radiculopathy 07/12/2011   Encounter for general adult medical examination without abnormal findings 07/10/2011   TRIGGER FINGER, RIGHT MIDDLE 01/11/2011   Other dysphagia 01/11/2011   MENOPAUSAL DISORDER 07/05/2010   OSTEOARTHRITIS, KNEES, BILATERAL 07/05/2010   NEVUS 05/20/2010   VAGINITIS 03/23/2010   SUPERFICIAL THROMBOPHLEBITIS 03/20/2009   FATIGUE 03/20/2009   DYSPHAGIA UNSPECIFIED  02/16/2009   Snoring 11/05/2008   HERPES ZOSTER 06/30/2008   Diabetes ([place]) 06/30/2008   Mixed hyperlipidemia 06/30/2008   ESOPHAGEAL STRICTURE 06/30/2008   FOOT PAIN, RIGHT 06/30/2008   OSTEOPOROSIS 06/30/2008   COLONIC POLYPS, HX OF 06/30/2008   PANIC ATTACK 09/05/2007   Allergic rhinitis 09/05/2007   Vocal cord dysfunction 09/05/2007   Intrinsic asthma 09/05/2007   Gastroesophageal reflux disease without esophagitis 09/05/2007    Past Surgical History:  Procedure Laterality Date   CARPAL TUNNEL RELEASE Bilateral    COLONOSCOPY  2017   JP-suprep(exc)=TA x 2   COLONOSCOPY W/ BIOPSIES AND POLYPECTOMY     ESOPHAGOGASTRODUODENOSCOPY (EGD) WITH ESOPHAGEAL DILATION     FOOT SURGERY Bilateral    "might have been bunions or hammertoes"   [facility] ARTHROSCOPY Left 11/06/2013   POLYPECTOMY  2017   TA x 2   TONSILLECTOMY     TOTAL ABDOMINAL HYSTERECTOMY     VIDEO BRONCHOSCOPY Bilateral 12/12/2014   Procedure: VIDEO BRONCHOSCOPY WITHOUT FLUORO;  Surgeon: Chesley Mires, MD;  Location: [facility] ENDOSCOPY;  Service: Cardiopulmonary;  Laterality: Bilateral;   WISDOM TOOTH EXTRACTION       OB History   No obstetric history on file.     Family History  Problem Relation Age of Onset   Heart attack Mother    Colon cancer Father 26   Colon polyps Father 43   Colon polyps Sister 68   Colon cancer Sister 28   Asthma Brother    Asthma Other        uncle   Colon polyps Daughter 66   Stomach cancer Neg Hx    Esophageal cancer Neg Hx    Rectal cancer Neg Hx     Social History   Tobacco Use   Smoking status: Former    Packs/day: 0.10    Years: 30.00    Pack years: 3.00    Types: Cigarettes    Start date: 01/31/1975    Quit date: 08/09/1995    Years since quitting: 26.0   Smokeless tobacco: Never  Vaping Use   Vaping Use: Never used  Substance Use Topics   Alcohol use: Yes    Alcohol/week: 0.0 standard drinks    Comment: once per 3 months   Drug use: No     Home Medications Prior to Admission medications   Medication Sig Start Date End Date Taking? Authorizing Provider  albuterol (PROVENTIL HFA) 108 (90 Base) MCG/ACT inhaler Inhale 2 puffs into the lungs every 4 (four) hours as needed for wheezing or shortness of breath. Reported on 12/30/2015 02/24/21 03/26/21  Elodia Florence., MD  albuterol (PROVENTIL) (2.5 MG/3ML) 0.083% nebulizer solution Take 3 mLs (2.5 mg total) by nebulization every 4 (four) hours as needed for shortness of breath. 02/24/21   Elodia Florence., MD  arformoterol (BROVANA) 15 MCG/2ML NEBU Take 2 mLs (15 mcg total) by nebulization 2 (two) times daily. 03/12/21   Parrett, Fonnie Mu, NP  aspirin EC 81 MG tablet Take 81 mg by mouth daily.    [provider]  Budeson-Glycopyrrol-Formoterol (BREZTRI AEROSPHERE) 160-9-4.8 MCG/ACT  AERO Inhale 2 puffs into the lungs in the morning and at bedtime. 06/17/20   Collene Gobble, MD  Budeson-Glycopyrrol-Formoterol (BREZTRI AEROSPHERE) 160-9-4.8 MCG/ACT AERO Inhale 2 puffs into the lungs in the morning and at bedtime. 05/25/21   Collene Gobble, MD  budesonide (PULMICORT) 0.25 MG/2ML nebulizer solution Take 2 mLs (0.25 mg total) by nebulization 2 (two) times daily. Patient taking differently: Take 0.25 mg by nebulization 2 (two) times daily as needed. 03/12/21   Parrett, Fonnie Mu, NP  CALCIUM-CHOLECALCIFEROL PO Take 1 tablet by mouth daily at 6 (six) AM.    [provider]  cetirizine (ZYRTEC ALLERGY) 10 MG tablet Take 1 tablet (10 mg total) by mouth daily. 09/01/21   Martyn Ehrich, NP  cholecalciferol (VITAMIN D) 1000 UNITS tablet Take 1,000 Units by mouth daily.    [provider]  doxycycline (VIBRA-TABS) 100 MG tablet Take 1 tablet (100 mg total) by mouth 2 (two) times daily. 09/01/21   Martyn Ehrich, NP  fluticasone (FLONASE) 50 MCG/ACT nasal spray Place 1 spray into the nose 2 (two) times daily.    [provider]  folic acid (FOLVITE) 1 MG  tablet Take 1 tablet (1 mg total) by mouth daily. 06/03/15   Celso Amy, NP  gabapentin (NEURONTIN) 100 MG capsule Take 100 mg by mouth at bedtime. 08/27/20   [provider]  guaiFENesin (MUCINEX) 600 MG 12 hr tablet Take 1 tablet (600 mg total) by mouth 2 (two) times daily for 14 days. 09/01/21 09/15/21  Martyn Ehrich, NP  hydroxyurea (HYDREA) 500 MG capsule Take 1 capsule (500 mg total) by mouth 3 (three) times daily before meals. 03/02/18   [name], MD  Magnesium 250 MG TABS Take 1 tablet by mouth daily.    [provider]  montelukast (SINGULAIR) 10 MG tablet Take 1 tablet (10 mg total) by mouth at bedtime. 03/11/21   Collene Gobble, MD  pantoprazole (PROTONIX) 40 MG tablet Take 1 tablet (40 mg total) by mouth 2 (two) times daily. Take 30- 60 min before your first and last meals of the day 03/11/21   Collene Gobble, MD  pravastatin (PRAVACHOL) 40 MG tablet  04/09/21   [provider]  predniSONE (DELTASONE) 10 MG tablet Take 4 tabs po daily x 3 days; then 3 tabs daily x3 days; then 2 tabs daily x3 days; then 1 tab daily x 3 days; then stop 09/01/21   Martyn Ehrich, NP  promethazine-dextromethorphan (PROMETHAZINE-DM) 6.25-15 MG/5ML syrup Take 5 mLs by mouth 4 (four) times daily as needed for cough. 07/08/20   Tanda Rockers, MD  revefenacin (YUPELRI) 175 MCG/3ML nebulizer solution Take 3 mLs (175 mcg total) by nebulization daily. 03/12/21   Parrett, Fonnie Mu, NP  rosuvastatin (CRESTOR) 10 MG tablet Take 10 mg by mouth at bedtime. 02/13/21   [provider]    Allergies    Penicillin g, Penicillins, and Pravastatin  Review of Systems   Review of Systems  Constitutional:  Negative for chills and fever.  HENT:  Negative for congestion, rhinorrhea and sore throat.   Eyes:  Negative for visual disturbance.  Respiratory:  Positive for cough, shortness of breath and wheezing.   Cardiovascular:  Negative for chest pain, palpitations and leg swelling.   Gastrointestinal:  Negative for abdominal pain, nausea and vomiting.  Musculoskeletal:  Negative for back pain and neck pain.  Skin:  Negative for color change and rash.  Neurological:  Negative  for dizziness, syncope, light-headedness and headaches.  Psychiatric/Behavioral:  Negative for confusion.    Physical Exam Updated Vital Signs BP (!) 148/89 (BP Location: Left Arm)   Pulse (!) 116   Temp 98.1 F (36.7 C) (Oral)   Resp (!) 22   Ht 5\' 2"  (1.575 m)   Wt 71.2 kg   SpO2 96%   BMI 28.72 kg/m   Physical Exam Vitals and nursing note reviewed.  Constitutional:      General: She is not in acute distress.    Appearance: She is not ill-appearing, toxic-appearing or diaphoretic.  HENT:     Head: Normocephalic.  Eyes:     General: No scleral icterus.       Right eye: No discharge.        Left eye: No discharge.  Cardiovascular:     Rate and Rhythm: Normal rate.  Pulmonary:     Effort: Pulmonary effort is normal. Tachypnea present. No bradypnea or respiratory distress.     Breath sounds: Examination of the right-upper field reveals wheezing. Examination of the left-upper field reveals wheezing. Examination of the right-middle field reveals wheezing. Examination of the left-middle field reveals wheezing. Examination of the right-lower field reveals wheezing. Examination of the left-lower field reveals wheezing. Wheezing present.     Comments: Inspiratory and expiratory wheezing noted to all lung fields. Musculoskeletal:     Right lower leg: Normal.     Left lower leg: Normal.  Skin:    General: Skin is warm and dry.  Neurological:     General: No focal deficit present.     Mental Status: She is alert.  Psychiatric:        Behavior: Behavior is cooperative.    ED Results / Procedures / Treatments   Labs (all labs ordered are listed, but only abnormal results are displayed) Labs Reviewed  RESP PANEL BY RT-PCR (FLU A&B, COVID) [id number]  COMPREHENSIVE METABOLIC PANEL  CBC  WITH DIFFERENTIAL/PLATELET    EKG None  Radiology No results found.  Procedures Procedures   Medications Ordered in ED Medications  albuterol (PROVENTIL,VENTOLIN) solution continuous neb (10 mg/hr Nebulization Given 09/03/21 2124)  magnesium sulfate IVPB 2 g 50 mL (2 g Intravenous New Bag/Given 09/03/21 2153)  albuterol (PROVENTIL) (2.5 MG/3ML) 0.083% nebulizer solution (10 mg/hr Nebulization Given 09/03/21 2021)  methylPREDNISolone sodium succinate (SOLU-MEDROL) 125 mg/2 mL injection 125 mg (125 mg Intravenous Given 09/03/21 2015)    ED Course  I have reviewed the triage vital signs and the nursing notes.  Pertinent labs & imaging results that were available during my care of the patient were reviewed by me and considered in my medical decision making (see chart for details).    MDM Rules/Calculators/A&P                           Alert 75 year old female no acute distress, nontoxic-appearing.  Presents to ED with chief complaint of wheezing and shortness of breath.  Patient has history of asthma.  Went to PCP on Wednesday due to asthma exacerbation.  Patient was started on prednisone.  Patient reports that despite prednisone, albuterol, and albuterol nebulizer wheezing has been persistent and gotten worse.  On physical exam patient has expiratory and expiratory wheezing noted to all lung fields.  Patient able to speak in full complete sentences.  Patient in no acute respiratory distress.  We will start patient on albuterol nebulizer treatment as well as received Solu-Medrol.  Will reexamine after  this treatment.  Patient continues to have expiratory expiratory wheezing noted to lung fields.  Patient reports no improvement in her symptoms after Solu-Medrol and nebulizer.  Will order lab work, respiratory panel, magnesium and second nebulizer treatment.  If patient's symptoms or not improved after second nebulizer treatment suspect she will likely need to be admitted.  Patient care  transferred to Dr.Wentz at the end of my shift. Patient presentation, ED course, and plan of care discussed with review of all pertinent labs and imaging. Please see his/her note for further details regarding further ED course and disposition.   Final Clinical Impression(s) / ED Diagnoses Final diagnoses:  None    Rx / DC Orders ED Discharge Orders     None        Dyann Ruddle 09/03/21 2213    Daleen Bo, MD 09/04/21 1103

## 2021-09-04 ENCOUNTER — Encounter (HOSPITAL_COMMUNITY): Payer: Self-pay | Admitting: Internal Medicine

## 2021-09-04 DIAGNOSIS — M059 Rheumatoid arthritis with rheumatoid factor, unspecified: Secondary | ICD-10-CM | POA: Diagnosis present

## 2021-09-04 DIAGNOSIS — J849 Interstitial pulmonary disease, unspecified: Secondary | ICD-10-CM | POA: Diagnosis present

## 2021-09-04 DIAGNOSIS — M170 Bilateral primary osteoarthritis of knee: Secondary | ICD-10-CM | POA: Diagnosis present

## 2021-09-04 DIAGNOSIS — K219 Gastro-esophageal reflux disease without esophagitis: Secondary | ICD-10-CM | POA: Diagnosis present

## 2021-09-04 DIAGNOSIS — R7303 Prediabetes: Secondary | ICD-10-CM | POA: Diagnosis present

## 2021-09-04 DIAGNOSIS — K449 Diaphragmatic hernia without obstruction or gangrene: Secondary | ICD-10-CM | POA: Diagnosis present

## 2021-09-04 DIAGNOSIS — J309 Allergic rhinitis, unspecified: Secondary | ICD-10-CM | POA: Diagnosis present

## 2021-09-04 DIAGNOSIS — Z7982 Long term (current) use of aspirin: Secondary | ICD-10-CM | POA: Diagnosis not present

## 2021-09-04 DIAGNOSIS — Z79899 Other long term (current) drug therapy: Secondary | ICD-10-CM | POA: Diagnosis not present

## 2021-09-04 DIAGNOSIS — J4541 Moderate persistent asthma with (acute) exacerbation: Secondary | ICD-10-CM | POA: Diagnosis not present

## 2021-09-04 DIAGNOSIS — Z87891 Personal history of nicotine dependence: Secondary | ICD-10-CM | POA: Diagnosis not present

## 2021-09-04 DIAGNOSIS — J45901 Unspecified asthma with (acute) exacerbation: Secondary | ICD-10-CM | POA: Diagnosis present

## 2021-09-04 DIAGNOSIS — Z8673 Personal history of transient ischemic attack (TIA), and cerebral infarction without residual deficits: Secondary | ICD-10-CM

## 2021-09-04 DIAGNOSIS — M810 Age-related osteoporosis without current pathological fracture: Secondary | ICD-10-CM | POA: Diagnosis present

## 2021-09-04 DIAGNOSIS — Z20822 Contact with and (suspected) exposure to covid-19: Secondary | ICD-10-CM | POA: Diagnosis present

## 2021-09-04 DIAGNOSIS — Z800 Family history of malignant neoplasm of digestive organs: Secondary | ICD-10-CM | POA: Diagnosis not present

## 2021-09-04 DIAGNOSIS — J449 Chronic obstructive pulmonary disease, unspecified: Secondary | ICD-10-CM | POA: Diagnosis not present

## 2021-09-04 DIAGNOSIS — Z8249 Family history of ischemic heart disease and other diseases of the circulatory system: Secondary | ICD-10-CM | POA: Diagnosis not present

## 2021-09-04 DIAGNOSIS — B379 Candidiasis, unspecified: Secondary | ICD-10-CM | POA: Diagnosis present

## 2021-09-04 DIAGNOSIS — J441 Chronic obstructive pulmonary disease with (acute) exacerbation: Secondary | ICD-10-CM | POA: Diagnosis present

## 2021-09-04 DIAGNOSIS — D573 Sickle-cell trait: Secondary | ICD-10-CM | POA: Diagnosis present

## 2021-09-04 DIAGNOSIS — E782 Mixed hyperlipidemia: Secondary | ICD-10-CM | POA: Diagnosis present

## 2021-09-04 DIAGNOSIS — Z825 Family history of asthma and other chronic lower respiratory diseases: Secondary | ICD-10-CM | POA: Diagnosis not present

## 2021-09-04 DIAGNOSIS — Z8371 Family history of colonic polyps: Secondary | ICD-10-CM | POA: Diagnosis not present

## 2021-09-04 DIAGNOSIS — K222 Esophageal obstruction: Secondary | ICD-10-CM | POA: Diagnosis present

## 2021-09-04 DIAGNOSIS — D693 Immune thrombocytopenic purpura: Secondary | ICD-10-CM | POA: Diagnosis present

## 2021-09-04 LAB — BASIC METABOLIC PANEL
Anion gap: 11 (ref 5–15)
BUN: 13 mg/dL (ref 8–23)
CO2: 25 mmol/L (ref 22–32)
Calcium: 9 mg/dL (ref 8.9–10.3)
Chloride: 103 mmol/L (ref 98–111)
Creatinine, Ser: 0.96 mg/dL (ref 0.44–1.00)
GFR, Estimated: >60 mL/min (ref >60)
Glucose, Bld: 195 mg/dL — ABNORMAL HIGH (ref 70–99)
Potassium: 4.2 mmol/L (ref 3.5–5.1)
Sodium: 139 mmol/L (ref 135–145)

## 2021-09-04 LAB — GLUCOSE, CAPILLARY
Glucose-Capillary: 245 mg/dL — ABNORMAL HIGH (ref 70–99)
Glucose-Capillary: 157 mg/dL — ABNORMAL HIGH (ref 70–99)
Glucose-Capillary: 128 mg/dL — ABNORMAL HIGH (ref 70–99)
Glucose-Capillary: 148 mg/dL — ABNORMAL HIGH (ref 70–99)
Glucose-Capillary: 204 mg/dL — ABNORMAL HIGH (ref 70–99)

## 2021-09-04 LAB — CBC
HCT: 36.4 % (ref 36.0–46.0)
Hemoglobin: 11.8 g/dL — ABNORMAL LOW (ref 12.0–15.0)
MCH: 28.4 pg (ref 26.0–34.0)
MCHC: 32.4 g/dL (ref 30.0–36.0)
MCV: 87.7 fL (ref 80.0–100.0)
Platelets: 292 10*3/uL (ref 150–400)
RBC: 4.15 MIL/uL (ref 3.87–5.11)
RDW: 18.4 % — ABNORMAL HIGH (ref 11.5–15.5)
WBC: 8.2 10*3/uL (ref 4.0–10.5)
nRBC: 0 % (ref 0.0–0.2)

## 2021-09-04 MED ORDER — ASPIRIN EC 81 MG PO TBEC
81.0000 mg | DELAYED_RELEASE_TABLET | Freq: Every day | ORAL | Status: DC
  Administered 2021-09-04 – 2021-09-06 (×3): 81 mg via ORAL
  Filled 2021-09-04 (×3): qty 1

## 2021-09-04 MED ORDER — POLYETHYLENE GLYCOL 3350 17 G PO PACK
17.0000 g | PACK | Freq: Every day | ORAL | Status: DC
  Administered 2021-09-04 – 2021-09-06 (×3): 17 g via ORAL
  Filled 2021-09-04 (×3): qty 1

## 2021-09-04 MED ORDER — GABAPENTIN 100 MG PO CAPS
100.0000 mg | ORAL_CAPSULE | Freq: Every day | ORAL | Status: DC
  Administered 2021-09-04 – 2021-09-05 (×2): 100 mg via ORAL
  Filled 2021-09-04 (×2): qty 1

## 2021-09-04 MED ORDER — INSULIN ASPART 100 UNIT/ML IJ SOLN
0.0000 [IU] | Freq: Three times a day (TID) | INTRAMUSCULAR | Status: DC
  Administered 2021-09-04: 2 [IU] via SUBCUTANEOUS
  Administered 2021-09-04: 3 [IU] via SUBCUTANEOUS
  Administered 2021-09-04: 2 [IU] via SUBCUTANEOUS
  Administered 2021-09-05: 5 [IU] via SUBCUTANEOUS
  Administered 2021-09-06 (×2): 3 [IU] via SUBCUTANEOUS
  Filled 2021-09-04: qty 0.15

## 2021-09-04 MED ORDER — GUAIFENESIN ER 600 MG PO TB12
600.0000 mg | ORAL_TABLET | Freq: Two times a day (BID) | ORAL | Status: DC
  Administered 2021-09-04: 600 mg via ORAL
  Filled 2021-09-04: qty 1

## 2021-09-04 MED ORDER — FLUTICASONE PROPIONATE 50 MCG/ACT NA SUSP
1.0000 | Freq: Two times a day (BID) | NASAL | Status: DC
  Administered 2021-09-04 – 2021-09-06 (×5): 1 via NASAL
  Filled 2021-09-04: qty 16

## 2021-09-04 MED ORDER — FOLIC ACID 1 MG PO TABS
1.0000 mg | ORAL_TABLET | Freq: Every day | ORAL | Status: DC
  Administered 2021-09-04 – 2021-09-06 (×3): 1 mg via ORAL
  Filled 2021-09-04 (×3): qty 1

## 2021-09-04 MED ORDER — MONTELUKAST SODIUM 10 MG PO TABS
10.0000 mg | ORAL_TABLET | Freq: Every day | ORAL | Status: DC
  Administered 2021-09-04 – 2021-09-05 (×2): 10 mg via ORAL
  Filled 2021-09-04 (×2): qty 1

## 2021-09-04 MED ORDER — GUAIFENESIN-DM 100-10 MG/5ML PO SYRP
5.0000 mL | ORAL_SOLUTION | ORAL | Status: DC | PRN
  Administered 2021-09-04 – 2021-09-05 (×4): 5 mL via ORAL
  Filled 2021-09-04 (×4): qty 10

## 2021-09-04 MED ORDER — ROSUVASTATIN CALCIUM 10 MG PO TABS
10.0000 mg | ORAL_TABLET | Freq: Every day | ORAL | Status: DC
  Administered 2021-09-04 – 2021-09-05 (×2): 10 mg via ORAL
  Filled 2021-09-04 (×2): qty 1

## 2021-09-04 MED ORDER — METHYLPREDNISOLONE SODIUM SUCC 40 MG IJ SOLR
40.0000 mg | Freq: Two times a day (BID) | INTRAMUSCULAR | Status: DC
  Administered 2021-09-04 – 2021-09-05 (×2): 40 mg via INTRAVENOUS
  Filled 2021-09-04 (×2): qty 1

## 2021-09-04 MED ORDER — HYDROXYUREA 500 MG PO CAPS
500.0000 mg | ORAL_CAPSULE | Freq: Three times a day (TID) | ORAL | Status: DC
  Administered 2021-09-04 – 2021-09-06 (×8): 500 mg via ORAL
  Filled 2021-09-04 (×8): qty 1

## 2021-09-04 MED ORDER — VITAMIN D 25 MCG (1000 UNIT) PO TABS
1000.0000 [IU] | ORAL_TABLET | Freq: Every day | ORAL | Status: DC
  Administered 2021-09-04 – 2021-09-06 (×3): 1000 [IU] via ORAL
  Filled 2021-09-04 (×3): qty 1

## 2021-09-04 MED ORDER — MAGNESIUM OXIDE -MG SUPPLEMENT 400 (240 MG) MG PO TABS
200.0000 mg | ORAL_TABLET | Freq: Every day | ORAL | Status: DC
  Administered 2021-09-04 – 2021-09-06 (×3): 200 mg via ORAL
  Filled 2021-09-04 (×3): qty 1

## 2021-09-04 MED ORDER — METHOTREXATE 2.5 MG PO TABS: 15.0000 mg | ORAL_TABLET | ORAL | Status: DC

## 2021-09-04 MED ORDER — ALBUTEROL SULFATE (2.5 MG/3ML) 0.083% IN NEBU: 2.5000 mg | INHALATION_SOLUTION | Freq: Three times a day (TID) | RESPIRATORY_TRACT | Status: DC

## 2021-09-04 MED ORDER — ALBUTEROL SULFATE (2.5 MG/3ML) 0.083% IN NEBU
2.5000 mg | INHALATION_SOLUTION | Freq: Three times a day (TID) | RESPIRATORY_TRACT | Status: DC
  Administered 2021-09-04 – 2021-09-06 (×6): 2.5 mg via RESPIRATORY_TRACT
  Filled 2021-09-04 (×6): qty 3

## 2021-09-04 MED ORDER — PANTOPRAZOLE SODIUM 40 MG PO TBEC
40.0000 mg | DELAYED_RELEASE_TABLET | Freq: Two times a day (BID) | ORAL | Status: DC
  Administered 2021-09-04 – 2021-09-06 (×5): 40 mg via ORAL
  Filled 2021-09-04 (×5): qty 1

## 2021-09-04 NOTE — Plan of Care (Signed)

## 2021-09-04 NOTE — Progress Notes (Signed)
PROGRESS NOTE  [name]  QQI:[id number] DOB: [birth year] DOA: 09/03/2021 PCP: Merrilee Seashore, MD   Brief Narrative: [name] is a 75 y.o. female with a history of asthma/COPD, VCD, RA on MTX history of CVA, HLD, prediabetes who presented to the ED 9/30 with refractory wheezing and shortness of breath despite taking prednisone per PCP and nebs at home. In the ED she was given bronchodilators, steroids with improvement in respiratory distress but still wheezing and not at baseline. CXR without infiltrate, covid-19 and flu PCR negative.  Assessment & Plan: Principal Problem:   Asthma exacerbation Active Problems:   Mixed hyperlipidemia   Asthma-COPD overlap syndrome (HCC)   Seropositive rheumatoid arthritis (HCC)   History of stroke  Asthma/COPD exacerbation: Also note history of VCD and current upper airway sounds on exam. - Continue scheduled bronchodilators, singulair - Continue steroids, augment to IV solumedrol as wheezing persists - Azithromycin x5 days  ILD: Mild by HRCT.  - Continue pulmonary follow up.   Prediabetes:  - Continue SSI while on IV steroids  Sickle cell trait:  - Continue hydrea  RA:  - Hold MTX for now, continue folate  HLD:  - Continue statin  History of CVA:  - Continue ASA, statin  DVT prophylaxis: Lovenox Code Status: Full Family Communication: None at bedside Disposition Plan:  Status is: Observation  The patient remains OBS appropriate and will d/c before 2 midnights.  Dispo: The patient is from: Home              Anticipated d/c is to: Home              Patient currently is not medically stable to d/c.  Consultants:  None  Procedures:  None  Antimicrobials: Azithromycin   Subjective: Still wheezing constantly, moderately short of breath with exertion/walking to bathroom (10 ft). No chest pain or leg swelling.   Objective: Vitals:   09/04/21 0100 09/04/21 0212 09/04/21 0530 09/04/21 0721  BP: (!) 142/68  135/75 (!) 139/101   Pulse:  (!) 102 (!) 103   Resp: 17 18 20    Temp:  97.7 F (36.5 C) 98 F (36.7 C)   TempSrc:  Oral Oral   SpO2: 98% 100% 100% 99%  Weight:      Height:        Intake/Output Summary (Last 24 hours) at 09/04/2021 1130 Last data filed at 09/04/2021 0200 Gross per 24 hour  Intake 654.85 ml  Output --  Net 654.85 ml   Filed Weights   09/03/21 1924  Weight: 71.2 kg    Gen: 75 y.o. female in no distress  Pulm: Non-labored tachypnea with expiratory wheezes at bases, good aeration. Upper airway sounds are transmitted throughout as well. CV: Regular rate and rhythm. No murmur, rub, or gallop. No JVD, no pedal edema. GI: Abdomen soft, non-tender, non-distended, with normoactive bowel sounds. No organomegaly or masses felt. Ext: Warm, no deformities Skin: No rashes, lesions or ulcers Neuro: Alert and oriented. No focal neurological deficits. Psych: Judgement and insight appear normal. Mood & affect appropriate.   Data Reviewed: I have personally reviewed following labs and imaging studies  CBC: Recent Labs  Lab 09/03/21 2146 09/04/21 0528  WBC 8.4 8.2  NEUTROABS 7.7  --   HGB 13.1 11.8*  HCT 41.2 36.4  MCV 89.0 87.7  PLT 333 941   Basic Metabolic Panel: Recent Labs  Lab 09/03/21 2146 09/04/21 0528  NA 141 139  K 4.2 4.2  CL 107 103  CO2 26 25  GLUCOSE 186* 195*  BUN 14 13  CREATININE 0.84 0.96  CALCIUM 9.1 9.0   GFR: Estimated Creatinine Clearance: 46 mL/min (by C-G formula based on SCr of 0.96 mg/dL). Liver Function Tests: Recent Labs  Lab 09/03/21 2146  AST 22  ALT 22  ALKPHOS 62  BILITOT 0.5  PROT 6.6  ALBUMIN 3.6   No results for input(s): LIPASE, AMYLASE in the last 168 hours. No results for input(s): AMMONIA in the last 168 hours. Coagulation Profile: No results for input(s): INR, PROTIME in the last 168 hours. Cardiac Enzymes: No results for input(s): CKTOTAL, CKMB, CKMBINDEX, TROPONINI in the last 168 hours. BNP (last 3  results) No results for input(s): PROBNP in the last 8760 hours. HbA1C: No results for input(s): HGBA1C in the last 72 hours. CBG: Recent Labs  Lab 09/04/21 0156 09/04/21 0752  GLUCAP 245* 157*   Lipid Profile: No results for input(s): CHOL, HDL, LDLCALC, TRIG, CHOLHDL, LDLDIRECT in the last 72 hours. Thyroid Function Tests: No results for input(s): TSH, T4TOTAL, FREET4, T3FREE, THYROIDAB in the last 72 hours. Anemia Panel: No results for input(s): VITAMINB12, FOLATE, FERRITIN, TIBC, IRON, RETICCTPCT in the last 72 hours. Urine analysis:    Component Value Date/Time   COLORURINE YELLOW 09/01/2012 2014   APPEARANCEUR CLOUDY (A) 09/01/2012 2014   LABSPEC 1.021 09/01/2012 2014   PHURINE 5.5 09/01/2012 2014   GLUCOSEU NEGATIVE 09/01/2012 2014   GLUCOSEU NEGATIVE 01/11/2011 0950   HGBUR NEGATIVE 09/01/2012 2014   BILIRUBINUR NEGATIVE 09/01/2012 2014   [place] NEGATIVE 09/01/2012 2014   PROTEINUR NEGATIVE 09/01/2012 2014   UROBILINOGEN 1.0 09/01/2012 2014   NITRITE NEGATIVE 09/01/2012 2014   LEUKOCYTESUR TRACE (A) 09/01/2012 2014   Recent Results (from the past 240 hour(s))  Resp Panel by RT-PCR (Flu A&B, Covid) Nasopharyngeal Swab     Status: None   Collection Time: 09/03/21  9:46 PM   Specimen: Nasopharyngeal Swab; Nasopharyngeal(NP) swabs in vial transport medium  Result Value Ref Range Status   SARS Coronavirus 2 by RT PCR NEGATIVE NEGATIVE Final    Comment: (NOTE) SARS-CoV-2 target nucleic acids are NOT DETECTED.  The SARS-CoV-2 RNA is generally detectable in upper respiratory specimens during the acute phase of infection. The lowest concentration of SARS-CoV-2 viral copies this assay can detect is 138 copies/mL. A negative result does not preclude SARS-Cov-2 infection and should not be used as the sole basis for treatment or other patient management decisions. A negative result may occur with  improper specimen collection/handling, submission of specimen other than  nasopharyngeal swab, presence of viral mutation(s) within the areas targeted by this assay, and inadequate number of viral copies(<138 copies/mL). A negative result must be combined with clinical observations, patient history, and epidemiological information. The expected result is Negative.  Fact Sheet for Patients:  EntrepreneurPulse.com.au  Fact Sheet for Healthcare Providers:  IncredibleEmployment.be  This test is no t yet approved or cleared by the [place] FDA and  has been authorized for detection and/or diagnosis of SARS-CoV-2 by FDA under an Emergency Use Authorization (EUA). This EUA will remain  in effect (meaning this test can be used) for the duration of the COVID-19 declaration under Section 564(b)(1) of the Act, 21 U.S.C.section 360bbb-3(b)(1), unless the authorization is terminated  or revoked sooner.       Influenza A by PCR NEGATIVE NEGATIVE Final   Influenza B by PCR NEGATIVE NEGATIVE Final    Comment: (NOTE) The Xpert Xpress SARS-CoV-2/FLU/RSV plus assay is intended as  an aid in the diagnosis of influenza from Nasopharyngeal swab specimens and should not be used as a sole basis for treatment. Nasal washings and aspirates are unacceptable for Xpert Xpress SARS-CoV-2/FLU/RSV testing.  Fact Sheet for Patients: EntrepreneurPulse.com.au  Fact Sheet for Healthcare Providers: IncredibleEmployment.be  This test is not yet approved or cleared by the [place] FDA and has been authorized for detection and/or diagnosis of SARS-CoV-2 by FDA under an Emergency Use Authorization (EUA). This EUA will remain in effect (meaning this test can be used) for the duration of the COVID-19 declaration under Section 564(b)(1) of the Act, 21 U.S.C. section 360bbb-3(b)(1), unless the authorization is terminated or revoked.  Performed at [facility], [place] [address] [place], [place] [address]       Radiology Studies: DG Chest Port 1 View  Result Date: 09/03/2021 CLINICAL DATA:  Short of breath, asthma exacerbation EXAM: PORTABLE CHEST 1 VIEW COMPARISON:  02/20/2021 FINDINGS: Single frontal view of the chest demonstrates a stable cardiac silhouette. Continued ectasia of the thoracic aorta. No airspace disease, effusion, or pneumothorax. IMPRESSION: 1. No acute intrathoracic process. Electronically Signed   By: Randa Ngo M.D.   On: 09/03/2021 22:51    Scheduled Meds:  aspirin EC  81 mg Oral Daily   [START ON 09/05/2021] azithromycin  500 mg Oral Daily   cholecalciferol  1,000 Units Oral Daily   enoxaparin (LOVENOX) injection  40 mg Subcutaneous Q24H   fluticasone  1 spray Each Nare BID   folic acid  1 mg Oral Daily   gabapentin  100 mg Oral QHS   guaiFENesin  600 mg Oral BID   hydroxyurea  500 mg Oral TID AC   insulin aspart  0-15 Units Subcutaneous TID WC   magnesium oxide  200 mg Oral Daily   mometasone-formoterol  2 puff Inhalation BID   montelukast  10 mg Oral QHS   pantoprazole  40 mg Oral BID AC   predniSONE  40 mg Oral Q breakfast   rosuvastatin  10 mg Oral QHS   umeclidinium bromide  1 puff Inhalation Daily   Continuous Infusions:   LOS: 0 days   Time spent: 25 minutes.  Patrecia Pour, MD [facility] [email or web address] 09/04/2021, 11:30 AM

## 2021-09-05 LAB — GLUCOSE, CAPILLARY
Glucose-Capillary: 211 mg/dL — ABNORMAL HIGH (ref 70–99)
Glucose-Capillary: 91 mg/dL (ref 70–99)
Glucose-Capillary: 112 mg/dL — ABNORMAL HIGH (ref 70–99)
Glucose-Capillary: 228 mg/dL — ABNORMAL HIGH (ref 70–99)

## 2021-09-05 MED ORDER — METHYLPREDNISOLONE SODIUM SUCC 125 MG IJ SOLR
60.0000 mg | Freq: Two times a day (BID) | INTRAMUSCULAR | Status: DC
  Administered 2021-09-05 – 2021-09-06 (×2): 60 mg via INTRAVENOUS
  Filled 2021-09-05 (×2): qty 2

## 2021-09-05 MED ORDER — HYDROCOD POLST-CPM POLST ER 10-8 MG/5ML PO SUER
5.0000 mL | Freq: Every evening | ORAL | Status: DC | PRN
  Administered 2021-09-05: 5 mL via ORAL
  Filled 2021-09-05: qty 5

## 2021-09-05 NOTE — Progress Notes (Signed)
PROGRESS NOTE  [name]  ZLD:[id number] DOB: Aug 10, 1946 DOA: 09/03/2021 PCP: Merrilee Seashore, MD   Brief Narrative: [name] is a 75 y.o. female with a history of asthma/COPD, VCD, RA on MTX history of CVA, HLD, prediabetes who presented to the ED 9/30 with refractory wheezing and shortness of breath despite taking prednisone per PCP and nebs at home. In the ED she was given bronchodilators, steroids with improvement in respiratory distress but still wheezing and not at baseline. CXR without infiltrate, covid-19 and flu PCR negative.  Assessment & Plan: Principal Problem:   Asthma exacerbation Active Problems:   Mixed hyperlipidemia   Asthma-COPD overlap syndrome (HCC)   Seropositive rheumatoid arthritis (HCC)   History of stroke   Acute asthma exacerbation  Asthma/COPD exacerbation: Also note history of VCD and current upper airway sounds on exam. - Continue scheduled bronchodilators, singulair - Continue IV steroids, will increase dose with continued wheezing and exertional dyspnea.   - Azithromycin x5 days  ILD: Mild by HRCT.  - Continue pulmonary follow up.   Prediabetes:  - Continue SSI while on IV steroids  Sickle cell trait:  - Continue hydrea  RA:  - Hold MTX for now, continue folate  HLD:  - Continue statin  History of CVA:  - Continue ASA, statin  DVT prophylaxis: Lovenox Code Status: Full Family Communication: None at bedside Disposition Plan:  Status is: Inpatient. Pt remains severely dyspneic on exertion with wheezing.   Dispo: The patient is from: Home              Anticipated d/c is to: Home              Patient currently is not medically stable to d/c.  Consultants:  None  Procedures:  None  Antimicrobials: Azithromycin   Subjective: Feels no better than yesterday as far as moderate-severe dyspnea on exertion just a few steps, wheezing that improves with nebs. No chest pain. No leg swelling. No fever. Cough is improved with  antitussive, though interrupted sleep considerably.   Objective: Vitals:   09/05/21 0542 09/05/21 0846 09/05/21 1150 09/05/21 1408  BP: 131/74  (!) 141/62   Pulse: 91  88   Resp: 20  (!) 21   Temp: 98.3 F (36.8 C)  98.2 F (36.8 C)   TempSrc: Oral  Oral   SpO2: 100% 97% 100% 100%  Weight:      Height:       No intake or output data in the 24 hours ending 09/05/21 1617  Filed Weights   09/03/21 1924  Weight: 71.2 kg   Gen: 75 y.o. female in no distress Pulm: Tachypneic with wheezing, no crackles. CV: Regular rate and rhythm. No murmur, rub, or gallop. No JVD, no dependent edema. GI: Abdomen soft, non-tender, non-distended, with normoactive bowel sounds.  Ext: Warm, no deformities Skin: No rashes, lesions or ulcers on visualized skin. Neuro: Alert and oriented. No focal neurological deficits. Psych: Judgement and insight appear fair. Mood euthymic & affect congruent. Behavior is appropriate.    Data Reviewed: I have personally reviewed following labs and imaging studies  CBC: Recent Labs  Lab 09/03/21 2146 09/04/21 0528  WBC 8.4 8.2  NEUTROABS 7.7  --   HGB 13.1 11.8*  HCT 41.2 36.4  MCV 89.0 87.7  PLT 333 903   Basic Metabolic Panel: Recent Labs  Lab 09/03/21 2146 09/04/21 0528  NA 141 139  K 4.2 4.2  CL 107 103  CO2 26 25  GLUCOSE 186* 195*  BUN 14 13  CREATININE 0.84 0.96  CALCIUM 9.1 9.0   GFR: Estimated Creatinine Clearance: 46 mL/min (by C-G formula based on SCr of 0.96 mg/dL). Liver Function Tests: Recent Labs  Lab 09/03/21 2146  AST 22  ALT 22  ALKPHOS 62  BILITOT 0.5  PROT 6.6  ALBUMIN 3.6   No results for input(s): LIPASE, AMYLASE in the last 168 hours. No results for input(s): AMMONIA in the last 168 hours. Coagulation Profile: No results for input(s): INR, PROTIME in the last 168 hours. Cardiac Enzymes: No results for input(s): CKTOTAL, CKMB, CKMBINDEX, TROPONINI in the last 168 hours. BNP (last 3 results) No results for  input(s): PROBNP in the last 8760 hours. HbA1C: No results for input(s): HGBA1C in the last 72 hours. CBG: Recent Labs  Lab 09/04/21 1147 09/04/21 1646 09/04/21 2051 09/05/21 0717 09/05/21 1146  GLUCAP 128* 148* 204* 211* 91   Lipid Profile: No results for input(s): CHOL, HDL, LDLCALC, TRIG, CHOLHDL, LDLDIRECT in the last 72 hours. Thyroid Function Tests: No results for input(s): TSH, T4TOTAL, FREET4, T3FREE, THYROIDAB in the last 72 hours. Anemia Panel: No results for input(s): VITAMINB12, FOLATE, FERRITIN, TIBC, IRON, RETICCTPCT in the last 72 hours. Urine analysis:    Component Value Date/Time   COLORURINE YELLOW 09/01/2012 2014   APPEARANCEUR CLOUDY (A) 09/01/2012 2014   LABSPEC 1.021 09/01/2012 2014   PHURINE 5.5 09/01/2012 2014   GLUCOSEU NEGATIVE 09/01/2012 2014   GLUCOSEU NEGATIVE 01/11/2011 0950   HGBUR NEGATIVE 09/01/2012 2014   BILIRUBINUR NEGATIVE 09/01/2012 2014   [place] NEGATIVE 09/01/2012 2014   PROTEINUR NEGATIVE 09/01/2012 2014   UROBILINOGEN 1.0 09/01/2012 2014   NITRITE NEGATIVE 09/01/2012 2014   LEUKOCYTESUR TRACE (A) 09/01/2012 2014   Recent Results (from the past 240 hour(s))  Resp Panel by RT-PCR (Flu A&B, Covid) Nasopharyngeal Swab     Status: None   Collection Time: 09/03/21  9:46 PM   Specimen: Nasopharyngeal Swab; Nasopharyngeal(NP) swabs in vial transport medium  Result Value Ref Range Status   SARS Coronavirus 2 by RT PCR NEGATIVE NEGATIVE Final    Comment: (NOTE) SARS-CoV-2 target nucleic acids are NOT DETECTED.  The SARS-CoV-2 RNA is generally detectable in upper respiratory specimens during the acute phase of infection. The lowest concentration of SARS-CoV-2 viral copies this assay can detect is 138 copies/mL. A negative result does not preclude SARS-Cov-2 infection and should not be used as the sole basis for treatment or other patient management decisions. A negative result may occur with  improper specimen collection/handling,  submission of specimen other than nasopharyngeal swab, presence of viral mutation(s) within the areas targeted by this assay, and inadequate number of viral copies(<138 copies/mL). A negative result must be combined with clinical observations, patient history, and epidemiological information. The expected result is Negative.  Fact Sheet for Patients:  EntrepreneurPulse.com.au  Fact Sheet for Healthcare Providers:  IncredibleEmployment.be  This test is no t yet approved or cleared by the [place] FDA and  has been authorized for detection and/or diagnosis of SARS-CoV-2 by FDA under an Emergency Use Authorization (EUA). This EUA will remain  in effect (meaning this test can be used) for the duration of the COVID-19 declaration under Section 564(b)(1) of the Act, 21 U.S.C.section 360bbb-3(b)(1), unless the authorization is terminated  or revoked sooner.       Influenza A by PCR NEGATIVE NEGATIVE Final   Influenza B by PCR NEGATIVE NEGATIVE Final    Comment: (NOTE) The Xpert Xpress SARS-CoV-2/FLU/RSV  plus assay is intended as an aid in the diagnosis of influenza from Nasopharyngeal swab specimens and should not be used as a sole basis for treatment. Nasal washings and aspirates are unacceptable for Xpert Xpress SARS-CoV-2/FLU/RSV testing.  Fact Sheet for Patients: EntrepreneurPulse.com.au  Fact Sheet for Healthcare Providers: IncredibleEmployment.be  This test is not yet approved or cleared by the [place] FDA and has been authorized for detection and/or diagnosis of SARS-CoV-2 by FDA under an Emergency Use Authorization (EUA). This EUA will remain in effect (meaning this test can be used) for the duration of the COVID-19 declaration under Section 564(b)(1) of the Act, 21 U.S.C. section 360bbb-3(b)(1), unless the authorization is terminated or revoked.  Performed at [facility], [place] [address] [place], [place] [address]       Radiology Studies: DG Chest Port 1 View  Result Date: 09/03/2021 CLINICAL DATA:  Short of breath, asthma exacerbation EXAM: PORTABLE CHEST 1 VIEW COMPARISON:  02/20/2021 FINDINGS: Single frontal view of the chest demonstrates a stable cardiac silhouette. Continued ectasia of the thoracic aorta. No airspace disease, effusion, or pneumothorax. IMPRESSION: 1. No acute intrathoracic process. Electronically Signed   By: Randa Ngo M.D.   On: 09/03/2021 22:51    Scheduled Meds:  albuterol  2.5 mg Nebulization TID   aspirin EC  81 mg Oral Daily   azithromycin  500 mg Oral Daily   cholecalciferol  1,000 Units Oral Daily   enoxaparin (LOVENOX) injection  40 mg Subcutaneous Q24H   fluticasone  1 spray Each Nare BID   folic acid  1 mg Oral Daily   gabapentin  100 mg Oral QHS   hydroxyurea  500 mg Oral TID AC   insulin aspart  0-15 Units Subcutaneous TID WC   magnesium oxide  200 mg Oral Daily   methylPREDNISolone (SOLU-MEDROL) injection  40 mg Intravenous Q12H   mometasone-formoterol  2 puff Inhalation BID   montelukast  10 mg Oral QHS   pantoprazole  40 mg Oral BID AC   polyethylene glycol  17 g Oral Daily   rosuvastatin  10 mg Oral QHS   umeclidinium bromide  1 puff Inhalation Daily   Continuous Infusions:   LOS: 1 day   Time spent: 25 minutes.  Patrecia Pour, MD [facility] [email or web address] 09/05/2021, 4:17 PM

## 2021-09-06 LAB — GLUCOSE, CAPILLARY
Glucose-Capillary: 188 mg/dL — ABNORMAL HIGH (ref 70–99)
Glucose-Capillary: 166 mg/dL — ABNORMAL HIGH (ref 70–99)

## 2021-09-06 MED ORDER — PREDNISONE 20 MG PO TABS: 40.0000 mg | ORAL_TABLET | Freq: Every day | ORAL | 0 refills | Status: AC

## 2021-09-06 MED ORDER — AZITHROMYCIN 250 MG PO TABS: 250.0000 mg | ORAL_TABLET | Freq: Every day | ORAL | 0 refills | Status: AC

## 2021-09-06 NOTE — Consult Note (Signed)
[facility] CM Inpatient Consult   09/06/2021  [name] [birth year] [id number]  Patient is currently active with [facility] for chronic disease management services.  Patient has been engaged by a [facility] RN case Freight forwarder for outpatient disease management services.   Plan: Will continue to follow for progression and disposition plans.  Of note, [facility] Care Management services does not replace or interfere with any services that are arranged by inpatient case management or social work.   Netta Cedars, MSN, [facility] Liaison Nurse Mobile Phone [phone]  Toll free office [phone]

## 2021-09-06 NOTE — Discharge Summary (Signed)
Physician Discharge Summary  [name]:[id number] DOB: 02/05/1946 DOA: 09/03/2021  PCP: Merrilee Seashore, MD  Admit date: 09/03/2021 Discharge date: 09/06/2021  Admitted From: Home Disposition: Home   Recommendations for Outpatient Follow-up:  Follow up with pulmonology in next 1-2 weeks.  Home Health: None Equipment/Devices: None Discharge Condition: Stable CODE STATUS: Full Diet recommendation: Heart healthy  Brief/Interim Summary: [name] is a 75 y.o. female with a history of asthma/COPD, VCD, RA on MTX history of CVA, HLD, prediabetes who presented to the ED 9/30 with refractory wheezing and shortness of breath despite taking prednisone per PCP and nebs at home. In the ED she was given bronchodilators, steroids with improvement in respiratory distress but still wheezing and not at baseline. CXR without infiltrate, covid-19 and flu PCR negative. She was given IV steroids and continued scheduled nebulized treatments with improvement. She is ambulating without hypoxia and improved dyspnea at the time of discharge.  Discharge Diagnoses:  Principal Problem:   Asthma exacerbation Active Problems:   Mixed hyperlipidemia   Asthma-COPD overlap syndrome (HCC)   Seropositive rheumatoid arthritis (HCC)   History of stroke   Acute asthma exacerbation  Asthma/COPD exacerbation: Also note history of VCD and current continued upper airway sounds on exam. - Continue scheduled bronchodilators, singulair - Convert IV solumedrol to oral prednisone at discharge.  - Complete 5 days azithromycin   ILD: Mild by HRCT.  - Continue pulmonary follow up. Has appt 10/12   Prediabetes: PCP follow up advised   Sickle cell trait:  - Continue hydrea   RA:  - Continue home medications   HLD:  - Continue statin   History of CVA:  - Continue ASA, statin  Discharge Instructions Discharge Instructions     Diet - low sodium heart healthy   Complete by: As directed    Discharge  instructions   Complete by: As directed    You were admitted for an exacerbation of COPD/asthma and have improved. You will continue azithromycin and prednisone for 2 more days and follow up with pulmonology as scheduled 10/12 or earlier if your symptoms worsen.      Allergies as of 09/06/2021       Reactions   Penicillin G Rash   Other reaction(s): rash Other reaction(s): rash Other reaction(s): rash Other reaction(s): rash   Penicillins Itching, Rash   Did it involve swelling of the face/tongue/throat, SOB, or low BP? N Did it involve sudden or severe rash/hives, skin peeling, or any reaction on the inside of your mouth or nose? Y Did you need to seek medical attention at a hospital or doctor's office? N When did it last happen? Decades Ago      If all above answers are "NO", may proceed with cephalosporin use.   Pravastatin Other (See Comments)   Other reaction(s): leg cramps Other reaction(s): leg cramps Other reaction(s): leg cramps Other reaction(s): leg cramps        Medication List     STOP taking these medications    doxycycline 100 MG tablet Commonly known as: VIBRA-TABS       TAKE these medications    albuterol 108 (90 Base) MCG/ACT inhaler Commonly known as: Proventil HFA Inhale 2 puffs into the lungs every 4 (four) hours as needed for wheezing or shortness of breath. Reported on 12/30/2015   albuterol (2.5 MG/3ML) 0.083% nebulizer solution Commonly known as: PROVENTIL Take 3 mLs (2.5 mg total) by nebulization every 4 (four) hours as needed for shortness of breath.  arformoterol 15 MCG/2ML Nebu Commonly known as: BROVANA Take 2 mLs (15 mcg total) by nebulization 2 (two) times daily.   aspirin EC 81 MG tablet Take 81 mg by mouth daily.   azithromycin 250 MG tablet Commonly known as: ZITHROMAX Take 1 tablet (250 mg total) by mouth daily for 2 days. Start taking on: September 07, 2021   Breztri Aerosphere 160-9-4.8 MCG/ACT Aero Generic drug:  Budeson-Glycopyrrol-Formoterol Inhale 2 puffs into the lungs in the morning and at bedtime.   budesonide 0.25 MG/2ML nebulizer solution Commonly known as: PULMICORT Take 2 mLs (0.25 mg total) by nebulization 2 (two) times daily.   CALCIUM-CHOLECALCIFEROL PO Take 1 tablet by mouth daily at 6 (six) AM.   CENTRUM SILVER 50+WOMEN PO Take 1 tablet by mouth daily.   cetirizine 10 MG tablet Commonly known as: ZyrTEC Allergy Take 1 tablet (10 mg total) by mouth daily.   cholecalciferol 1000 units tablet Commonly known as: VITAMIN D Take 1,000 Units by mouth daily.   fluticasone 50 MCG/ACT nasal spray Commonly known as: FLONASE Place 1 spray into the nose 2 (two) times daily.   folic acid 1 MG tablet Commonly known as: FOLVITE Take 1 tablet (1 mg total) by mouth daily.   gabapentin 100 MG capsule Commonly known as: NEURONTIN Take 100 mg by mouth at bedtime.   guaiFENesin 600 MG 12 hr tablet Commonly known as: Mucinex Take 1 tablet (600 mg total) by mouth 2 (two) times daily for 14 days.   hydroxyurea 500 MG capsule Commonly known as: HYDREA Take 1 capsule (500 mg total) by mouth 3 (three) times daily before meals.   Magnesium 250 MG Tabs Take 1 tablet by mouth daily.   montelukast 10 MG tablet Commonly known as: SINGULAIR Take 1 tablet (10 mg total) by mouth at bedtime.   pantoprazole 40 MG tablet Commonly known as: PROTONIX Take 1 tablet (40 mg total) by mouth 2 (two) times daily. Take 30- 60 min before your first and last meals of the day   predniSONE 20 MG tablet Commonly known as: DELTASONE Take 2 tablets (40 mg total) by mouth daily with breakfast for 2 days. What changed:  medication strength how much to take how to take this when to take this additional instructions   revefenacin 175 MCG/3ML nebulizer solution Commonly known as: YUPELRI Take 3 mLs (175 mcg total) by nebulization daily.   rosuvastatin 10 MG tablet Commonly known as: CRESTOR Take 10 mg  by mouth at bedtime.        Follow-up Information     Merrilee Seashore, MD Follow up.   Specialty: Internal Medicine Contact information: [address] [place] [address] [phone]         Martyn Ehrich, NP. Go on 09/15/2021.   Specialty: Pulmonary Disease Contact information: [address] [place] [address] [phone]                Allergies  Allergen Reactions   Penicillin G Rash    Other reaction(s): rash Other reaction(s): rash Other reaction(s): rash Other reaction(s): rash   Penicillins Itching and Rash    Did it involve swelling of the face/tongue/throat, SOB, or low BP? N Did it involve sudden or severe rash/hives, skin peeling, or any reaction on the inside of your mouth or nose? Y Did you need to seek medical attention at a hospital or doctor's office? N When did it last happen? Decades Ago  If all above answers are "NO", may proceed with cephalosporin use.     Pravastatin Other (See Comments)    Other reaction(s): leg cramps Other reaction(s): leg cramps Other reaction(s): leg cramps Other reaction(s): leg cramps    Consultations: None  Procedures/Studies: DG Chest Port 1 View  Result Date: 09/03/2021 CLINICAL DATA:  Short of breath, asthma exacerbation EXAM: PORTABLE CHEST 1 VIEW COMPARISON:  02/20/2021 FINDINGS: Single frontal view of the chest demonstrates a stable cardiac silhouette. Continued ectasia of the thoracic aorta. No airspace disease, effusion, or pneumothorax. IMPRESSION: 1. No acute intrathoracic process. Electronically Signed   By: Randa Ngo M.D.   On: 09/03/2021 22:51    Subjective: Wheezing improved, dyspnea resolved at rest, improved but present with exertion. No hypoxia at rest or with exertion. Breathing treatments continue to help. Taking po without issues. No fever.   Discharge Exam: Vitals:   09/05/21 2225 09/06/21 0528  BP: 121/81 (!) 141/86  Pulse: 90 86   Resp: 16 18  Temp: 98 F (36.7 C) 97.8 F (36.6 C)  SpO2: 99% 100%   General: Pt is alert, awake, not in acute distress Cardiovascular: RRR, S1/S2 +, no rubs, no gallops Respiratory: Nonlabored and lung sounds are clear when breathing normally through nose. Upper airway sounds significant during exam otherwise (none prior to or after exam). Abdominal: Soft, NT, ND, bowel sounds + Extremities: No edema, no cyanosis  Labs: BNP (last 3 results) No results for input(s): BNP in the last 8760 hours. Basic Metabolic Panel: Recent Labs  Lab 09/03/21 2146 09/04/21 0528  NA 141 139  K 4.2 4.2  CL 107 103  CO2 26 25  GLUCOSE 186* 195*  BUN 14 13  CREATININE 0.84 0.96  CALCIUM 9.1 9.0   Liver Function Tests: Recent Labs  Lab 09/03/21 2146  AST 22  ALT 22  ALKPHOS 62  BILITOT 0.5  PROT 6.6  ALBUMIN 3.6   No results for input(s): LIPASE, AMYLASE in the last 168 hours. No results for input(s): AMMONIA in the last 168 hours. CBC: Recent Labs  Lab 09/03/21 2146 09/04/21 0528  WBC 8.4 8.2  NEUTROABS 7.7  --   HGB 13.1 11.8*  HCT 41.2 36.4  MCV 89.0 87.7  PLT 333 292   Cardiac Enzymes: No results for input(s): CKTOTAL, CKMB, CKMBINDEX, TROPONINI in the last 168 hours. BNP: Invalid input(s): POCBNP CBG: Recent Labs  Lab 09/05/21 0717 09/05/21 1146 09/05/21 1703 09/05/21 2224 09/06/21 0728  GLUCAP 211* 91 112* 228* 188*   D-Dimer No results for input(s): DDIMER in the last 72 hours. Hgb A1c No results for input(s): HGBA1C in the last 72 hours. Lipid Profile No results for input(s): CHOL, HDL, LDLCALC, TRIG, CHOLHDL, LDLDIRECT in the last 72 hours. Thyroid function studies No results for input(s): TSH, T4TOTAL, T3FREE, THYROIDAB in the last 72 hours.  Invalid input(s): FREET3 Anemia work up No results for input(s): VITAMINB12, FOLATE, FERRITIN, TIBC, IRON, RETICCTPCT in the last 72 hours. Urinalysis    Component Value Date/Time   COLORURINE YELLOW  09/01/2012 2014   APPEARANCEUR CLOUDY (A) 09/01/2012 2014   LABSPEC 1.021 09/01/2012 2014   PHURINE 5.5 09/01/2012 2014   GLUCOSEU NEGATIVE 09/01/2012 2014   GLUCOSEU NEGATIVE 01/11/2011 0950   HGBUR NEGATIVE 09/01/2012 2014   [place] NEGATIVE 09/01/2012 2014   [place] 09/01/2012 2014   PROTEINUR NEGATIVE 09/01/2012 2014   UROBILINOGEN 1.0 09/01/2012 2014   NITRITE NEGATIVE 09/01/2012 2014   LEUKOCYTESUR TRACE (A) 09/01/2012 2014  Microbiology Recent Results (from the past 240 hour(s))  Resp Panel by RT-PCR (Flu A&B, Covid) Nasopharyngeal Swab     Status: None   Collection Time: 09/03/21  9:46 PM   Specimen: Nasopharyngeal Swab; Nasopharyngeal(NP) swabs in vial transport medium  Result Value Ref Range Status   SARS Coronavirus 2 by RT PCR NEGATIVE NEGATIVE Final    Comment: (NOTE) SARS-CoV-2 target nucleic acids are NOT DETECTED.  The SARS-CoV-2 RNA is generally detectable in upper respiratory specimens during the acute phase of infection. The lowest concentration of SARS-CoV-2 viral copies this assay can detect is 138 copies/mL. A negative result does not preclude SARS-Cov-2 infection and should not be used as the sole basis for treatment or other patient management decisions. A negative result may occur with  improper specimen collection/handling, submission of specimen other than nasopharyngeal swab, presence of viral mutation(s) within the areas targeted by this assay, and inadequate number of viral copies(<138 copies/mL). A negative result must be combined with clinical observations, patient history, and epidemiological information. The expected result is Negative.  Fact Sheet for Patients:  EntrepreneurPulse.com.au  Fact Sheet for Healthcare Providers:  IncredibleEmployment.be  This test is no t yet approved or cleared by the [place] FDA and  has been authorized for detection and/or diagnosis of SARS-CoV-2  by FDA under an Emergency Use Authorization (EUA). This EUA will remain  in effect (meaning this test can be used) for the duration of the COVID-19 declaration under Section 564(b)(1) of the Act, 21 U.S.C.section 360bbb-3(b)(1), unless the authorization is terminated  or revoked sooner.       Influenza A by PCR NEGATIVE NEGATIVE Final   Influenza B by PCR NEGATIVE NEGATIVE Final    Comment: (NOTE) The Xpert Xpress SARS-CoV-2/FLU/RSV plus assay is intended as an aid in the diagnosis of influenza from Nasopharyngeal swab specimens and should not be used as a sole basis for treatment. Nasal washings and aspirates are unacceptable for Xpert Xpress SARS-CoV-2/FLU/RSV testing.  Fact Sheet for Patients: EntrepreneurPulse.com.au  Fact Sheet for Healthcare Providers: IncredibleEmployment.be  This test is not yet approved or cleared by the [place] FDA and has been authorized for detection and/or diagnosis of SARS-CoV-2 by FDA under an Emergency Use Authorization (EUA). This EUA will remain in effect (meaning this test can be used) for the duration of the COVID-19 declaration under Section 564(b)(1) of the Act, 21 U.S.C. section 360bbb-3(b)(1), unless the authorization is terminated or revoked.  Performed at [facility], [place] [address] [place], [place] [address]     Time coordinating discharge: Approximately 40 minutes  Patrecia Pour, MD  [facility] 09/06/2021, 10:20 AM

## 2021-09-08 ENCOUNTER — Other Ambulatory Visit: Payer: Self-pay | Admitting: *Deleted

## 2021-09-08 NOTE — Patient Outreach (Signed)
[facility]) Care Management  09/08/2021  [name] 1946/11/06 [id number]  [facility] outreach for post hospital follow up [name] was referred to [facility] on 02/25/21 by [facility] hospital liaison Referral Reason: for post hospital/complex care services Pt previously with [facility] disease management J Wine prior to admission   Insurance: united healthcare medicare AARP /blue cross and blue shield state PPO     Outreach to the home number [phone]  [name] is able to verify her HIPAA identifiers   Assessment [name] confirms her recent admission and hospital discharge for asthma She has audible shortness of breath and congestion today She reports she still "feels bad" and " can not talk a lot before I start to cough" This occurs throughout the assessment  RN CM reviewed her After summary sheet/hospital discharge sheets with her  She has her discharge medicines RN CM reviewed her action plan to take her medicines to include her antibiotic, steroid, inhaler/nebulizer as ordered and to remain hydrated Encouraged use of her incentive spirometer  She has a follow up to see her pulmonologist, Dr [name] on 08/16/21 but has not made a hospital primary care provider (PCP) hospital follow up appointment RN CM reviewed the importance of pcp follow up and home management interventions  Plans Patient agrees to care plan and follow up within the next 7-14 business days   Hennesy Sobalvarro L. [name], RN, BSN, [place] Coordinator Office number [phone] Main [facility] number [phone] Fax number [phone]

## 2021-09-09 DIAGNOSIS — R739 Hyperglycemia, unspecified: Secondary | ICD-10-CM | POA: Diagnosis not present

## 2021-09-09 DIAGNOSIS — R634 Abnormal weight loss: Secondary | ICD-10-CM | POA: Diagnosis not present

## 2021-09-09 DIAGNOSIS — J4541 Moderate persistent asthma with (acute) exacerbation: Secondary | ICD-10-CM | POA: Diagnosis not present

## 2021-09-14 ENCOUNTER — Encounter: Payer: Self-pay | Admitting: *Deleted

## 2021-09-14 ENCOUNTER — Other Ambulatory Visit: Payer: Self-pay

## 2021-09-14 ENCOUNTER — Other Ambulatory Visit: Payer: Self-pay | Admitting: *Deleted

## 2021-09-14 NOTE — Progress Notes (Signed)
@Patient  ID: [name], female    DOB: [birth year], 75 y.o.   MRN: [id number]  Chief Complaint  Patient presents with   Follow-up    2 wk f/u. Went to the hospital 2 days after last OV due to asthma. States she has been feeling "so-so". Still has a productive cough with yellow phlegm. Still wheezing.     Referring provider: Merrilee Seashore, MD  HPI: 75 year old female, former smoker quit 1996 (3-pack-year history).  Past medical history significant for asthma/COPD overlap syndrome, centrilobular emphysema, ILD, RA .  Patient of Dr. Lamonte Sakai, last seen by pulmonary nurse practitioner on 03/12/2021.  Previous [facility] pulmonary encounter:  09/01/2021 Patient presents today for acute office visit.  She developed cough 2 weeks ago with associated wheezing. She has history of upper airway sensitivity, symptoms worse with allergy season. She is compliant with Breztri Aerosphere two puffs twice daily and is using Albuterol nebulizer twice a day. She is taking Flonase nasal spray and Singulair 10mg  at bedtime. She can not afford Breztri prescription and did not qualify for patient assistance. From what I can tell she is not taking and has not received Brovana, Pulmicort or Yupelri nebulizers.   Hospital Summary: [name] is a 75 y.o. female with a history of asthma/COPD, VCD, RA on MTX history of CVA, HLD, prediabetes who presented to the ED 9/30 with refractory wheezing and shortness of breath despite taking prednisone per PCP and nebs at home. In the ED she was given bronchodilators, steroids with improvement in respiratory distress but still wheezing and not at baseline. CXR without infiltrate, covid-19 and flu PCR negative. She was given IV steroids and continued scheduled nebulized treatments with improvement. She is ambulating without hypoxia and improved dyspnea at the time of discharge.   09/15/2021- interim hx  Patient presents today for 2 week follow-up COPD/asthma exacerbation.  Originally treated with course of Doxycycline and prednisone taper but patient saw now improvement in her symptoms, advised to go to ED. She was admitted from 9/30-10/3 for asthma exacerbation.  She was discharged home on prednisone taper which she completed three days ago. She is doing some better, slow recovery. Still wheezing and has cough. Her symptoms typically flare with seasonal weather changes. She is taking Breztri aerosphere twice daily as directed. She has not received paperwork for patient assistance for [facility].     Allergies  Allergen Reactions   Penicillin G Rash    Other reaction(s): rash Other reaction(s): rash Other reaction(s): rash Other reaction(s): rash   Penicillins Itching and Rash    Did it involve swelling of the face/tongue/throat, SOB, or low BP? N Did it involve sudden or severe rash/hives, skin peeling, or any reaction on the inside of your mouth or nose? Y Did you need to seek medical attention at a hospital or doctor's office? N When did it last happen? Decades Ago      If all above answers are "NO", may proceed with cephalosporin use.     Pravastatin Other (See Comments)    Other reaction(s): leg cramps Other reaction(s): leg cramps Other reaction(s): leg cramps Other reaction(s): leg cramps    Immunization History  Administered Date(s) Administered   DTaP 03/20/2009   Fluad Quad(high Dose 65+) 09/16/2019   Influenza Split 11/10/2011, 10/10/2012, 09/17/2018   Influenza Whole 10/19/2006, 09/03/2008, 10/05/2009, 09/07/2010   Influenza, High Dose Seasonal PF 08/27/2014, 09/15/2016, 09/05/2017   Influenza, Quadrivalent, Recombinant, Inj, Pf 08/21/2017, 09/17/2018, 09/16/2019, 08/27/2020   Influenza,inj,Quad PF,6+ Mos 09/11/2013,  08/08/2014, 09/14/2015, 09/05/2016   Influenza-Unspecified 10/05/2018   Moderna SARS-COV2 Booster Vaccination 09/29/2020   Moderna Sars-Covid-2 Vaccination 01/27/2020, 02/25/2020   Pneumococcal Conjugate-13 11/05/2014    Pneumococcal Polysaccharide-23 03/01/2006, 12/29/2016   Td 03/20/2009   Zoster, Live 10/19/2006    Past Medical History:  Diagnosis Date   Allergic rhinitis    Allergy    seasonal allergies   Anemia    hx of   Asthma    on meds   Cataract    bilateral sx    Chronic bronchitis (HCC)    Colonic polyp    DDD (degenerative disc disease), lumbar 07/20/2011   DJD (degenerative joint disease) of knee    bilateral   DJD (degenerative joint disease), lumbar 07/20/2011   Esophageal stricture    GERD (gastroesophageal reflux disease)    on meds   Hiatal hernia    Hyperlipidemia    on meds   Osteoporosis    Stroke [facility]) 2017   "didn't know I'd had one before I was told; didn't do any damage" (11/24/2016)   Vocal cord dysfunction     Tobacco History: Social History   Tobacco Use  Smoking Status Former   Packs/day: 0.10   Years: 30.00   Pack years: 3.00   Types: Cigarettes   Start date: 01/31/1975   Quit date: 08/09/1995   Years since quitting: 26.1  Smokeless Tobacco Never   Counseling given: Not Answered   Outpatient Medications Prior to Visit  Medication Sig Dispense Refill   albuterol (PROVENTIL HFA) 108 (90 Base) MCG/ACT inhaler Inhale 2 puffs into the lungs every 4 (four) hours as needed for wheezing or shortness of breath. Reported on 12/30/2015 8 g 1   albuterol (PROVENTIL) (2.5 MG/3ML) 0.083% nebulizer solution Take 3 mLs (2.5 mg total) by nebulization every 4 (four) hours as needed for shortness of breath. 300 mL 3   aspirin EC 81 MG tablet Take 81 mg by mouth daily.     Budeson-Glycopyrrol-Formoterol (BREZTRI AEROSPHERE) 160-9-4.8 MCG/ACT AERO Inhale 2 puffs into the lungs in the morning and at bedtime. 10.7 g 11   CALCIUM-CHOLECALCIFEROL PO Take 1 tablet by mouth daily at 6 (six) AM.     cetirizine (ZYRTEC ALLERGY) 10 MG tablet Take 1 tablet (10 mg total) by mouth daily. 30 tablet 5   cholecalciferol (VITAMIN D) 1000 UNITS tablet Take 1,000 Units by mouth daily.      fluticasone (FLONASE) 50 MCG/ACT nasal spray Place 1 spray into the nose 2 (two) times daily.     folic acid (FOLVITE) 1 MG tablet Take 1 tablet (1 mg total) by mouth daily. 90 tablet 3   gabapentin (NEURONTIN) 100 MG capsule Take 100 mg by mouth at bedtime.     guaiFENesin (MUCINEX) 600 MG 12 hr tablet Take 1 tablet (600 mg total) by mouth 2 (two) times daily for 14 days. 28 tablet 0   hydroxyurea (HYDREA) 500 MG capsule Take 1 capsule (500 mg total) by mouth 3 (three) times daily before meals. 90 capsule 6   Magnesium 250 MG TABS Take 1 tablet by mouth daily.     montelukast (SINGULAIR) 10 MG tablet Take 1 tablet (10 mg total) by mouth at bedtime. 90 tablet 3   Multiple Vitamins-Minerals (CENTRUM SILVER 50+WOMEN PO) Take 1 tablet by mouth daily.     pantoprazole (PROTONIX) 40 MG tablet Take 1 tablet (40 mg total) by mouth 2 (two) times daily. Take 30- 60 min before your first and last meals of  the day 180 tablet 3   rosuvastatin (CRESTOR) 10 MG tablet Take 10 mg by mouth at bedtime.     arformoterol (BROVANA) 15 MCG/2ML NEBU Take 2 mLs (15 mcg total) by nebulization 2 (two) times daily. (Patient not taking: No sig reported) 180 mL 1   budesonide (PULMICORT) 0.25 MG/2ML nebulizer solution Take 2 mLs (0.25 mg total) by nebulization 2 (two) times daily. (Patient not taking: No sig reported) 180 mL 1   revefenacin (YUPELRI) 175 MCG/3ML nebulizer solution Take 3 mLs (175 mcg total) by nebulization daily. (Patient not taking: No sig reported) 270 mL 1   Facility-Administered Medications Prior to Visit  Medication Dose Route Frequency Provider Last Rate Last Admin   0.9 %  sodium chloride infusion  500 mL Intravenous Once Irene Shipper, MD        Review of Systems  Review of Systems  Constitutional: Negative.   HENT:  Positive for congestion.   Respiratory:  Positive for cough and wheezing.     Physical Exam  BP 118/72   Pulse 92   Temp 98.8 F (37.1 C) (Oral)   Ht 5\' 2"  (1.575 m)    Wt 152 lb 9.6 oz (69.2 kg)   SpO2 95% Comment: on RA  BMI 27.91 kg/m  Physical Exam Constitutional:      Appearance: Normal appearance.  HENT:     Head: Normocephalic and atraumatic.  Pulmonary:     Effort: Pulmonary effort is normal.     Breath sounds: Wheezing and rhonchi present.     Comments: Coarse rhonchi t/o lung fields  Musculoskeletal:        General: Normal range of motion.  Skin:    General: Skin is warm and dry.  Neurological:     General: No focal deficit present.     Mental Status: She is alert and oriented to person, place, and time. Mental status is at baseline.  Psychiatric:        Mood and Affect: Mood normal.        Thought Content: Thought content normal.        Judgment: Judgment normal.     Lab Results:  CBC    Component Value Date/Time   WBC 8.2 09/04/2021 0528   RBC 4.15 09/04/2021 0528   HGB 11.8 (L) 09/04/2021 0528   HGB 11.3 (L) 09/07/2020 0838   HGB 12.4 11/24/2017 0754   HCT 36.4 09/04/2021 0528   HCT 37.8 11/24/2017 0754   PLT 292 09/04/2021 0528   PLT 540 (H) 09/07/2020 0838   PLT 786 (H) 11/24/2017 0754   MCV 87.7 09/04/2021 0528   MCV 75 (L) 11/24/2017 0754   MCH 28.4 09/04/2021 0528   MCHC 32.4 09/04/2021 0528   RDW 18.4 (H) 09/04/2021 0528   RDW 16.7 (H) 11/24/2017 0754   LYMPHSABS 0.4 (L) 09/03/2021 2146   LYMPHSABS 1.9 11/24/2017 0754   MONOABS 0.1 09/03/2021 2146   EOSABS 0.0 09/03/2021 2146   EOSABS 0.2 11/24/2017 0754   BASOSABS 0.0 09/03/2021 2146   BASOSABS 0.1 11/24/2017 0754    BMET    Component Value Date/Time   NA 139 09/04/2021 0528   NA 145 11/24/2017 0754   NA 142 05/25/2017 0755   K 4.2 09/04/2021 0528   K 3.7 11/24/2017 0754   K 3.9 05/25/2017 0755   CL 103 09/04/2021 0528   CL 104 11/24/2017 0754   CO2 25 09/04/2021 0528   CO2 27 11/24/2017 0754   CO2 24  05/25/2017 0755   GLUCOSE 195 (H) 09/04/2021 0528   GLUCOSE 123 (H) 11/24/2017 0754   BUN 13 09/04/2021 0528   BUN 12 11/24/2017 0754   BUN  13.3 05/25/2017 0755   CREATININE 0.96 09/04/2021 0528   CREATININE 0.99 09/07/2020 0838   CREATININE 1.0 11/24/2017 0754   CREATININE 0.8 05/25/2017 0755   CALCIUM 9.0 09/04/2021 0528   CALCIUM 9.1 11/24/2017 0754   CALCIUM 9.5 05/25/2017 0755   GFRNONAA >60 09/04/2021 0528   GFRNONAA 56 (L) 09/07/2020 0838   GFRAA >60 09/07/2020 0838    BNP    Component Value Date/Time   BNP 30.5 07/06/2020 1800    ProBNP    Component Value Date/Time   PROBNP 102 06/16/2020 0901   PROBNP 61.1 09/01/2012 1912    Imaging: DG Chest Port 1 View  Result Date: 09/03/2021 CLINICAL DATA:  Short of breath, asthma exacerbation EXAM: PORTABLE CHEST 1 VIEW COMPARISON:  02/20/2021 FINDINGS: Single frontal view of the chest demonstrates a stable cardiac silhouette. Continued ectasia of the thoracic aorta. No airspace disease, effusion, or pneumothorax. IMPRESSION: 1. No acute intrathoracic process. Electronically Signed   By: Randa Ngo M.D.   On: 09/03/2021 22:51     Assessment & Plan:   Asthma-COPD overlap syndrome ([place]) - Treated out-patient for COPD/asthma exacerbation on 09/01/21 with Doxycycline + oral prednisone, eventually needing admission to the hospital for 3 days d/t worsening symptoms. Completed 5 day course of azithromycin and discharged on prednisone taper. She is slowly improving but continues to have wheezing and cough. LS with scattered coarse rhonchi/wheezing throughout lung fields. Extending prednisone taper (20mg  x 5 days; 10mg  x 5 days; 5mg  x 5 days). Advised she continue [employer], mucinex and flutter valve. Patient was given patient assistance paper work today for [employer] along with additional sample. Needs fu in 1 month with Dr. Lamonte Sakai.    ILD (interstitial lung disease) (HCC) - Hx RA. Serologies negative except for mild positive cytoplasmic ANA. She saw Dr. Vaughan Browner in 2021 and not recommended any specific treatment for ILD unless there is progression. Needs repeat PFTs  and HRCT to assess for worsening interstital disease.   Moderate persistent asthma with (acute) exacerbation - See above  Candidiasis - At risk for vaginal candidiasis d/t prolonged steroid course, RX diflucan 150mg  x 1   40 mins spent on case; > 50% time face to face with patient   Martyn Ehrich, NP 09/15/2021

## 2021-09-14 NOTE — Patient Outreach (Signed)
[place] [facility]) Care Management  09/14/2021  [name] Oct 28, 1946 [id number]  [facility] outreach for post hospital follow up Mrs OVETTA BAZZANO was referred to [facility] on 02/25/21 by [facility] hospital liaison Referral Reason: for post hospital/complex care services Pt previously with [facility] disease management J Wine prior to admission   Insurance: united healthcare medicare AARP /blue cross and blue shield state PPO     Outreach to the home number [phone]   Mrs Emminger is able to verify her HIPAA identifiers     Assessment Mrs Heinrich reports she feels "some better" but is noted to continue to cough with audible congestion throughout the outreach She reports she is scheduled to go to the pulmonologist on 09/15/21 (not 08/16/21 as listed in 09/07/21 note)for follow up and will know more then She reports her "voice comes and goes"  RN CM reviewed her respiratory medicines and a home COPD action plan with her  She confirms she has completed her ordered doses of antibiotics and prednisone She reports she is taking nebulizer treatments and Breztri inhaler twice a day She reports use of her emergency albuterol inhaler only once on 09/13/21 She confirms use of her Flonase She reports Sputum color clear to yellow  She denies smoking, being around smoking, mold/mildew, pets, or other exposures She reports she has remained in her home and has not attended church services as encouraged by her Bishop  She confirms she does clean her nebulizer between use   She confirms she did schedule a pcp hospital follow up appointment (09/09/21) and will be seen again on next week (09/23/21) as she and RN CM discussed    Mrs Peregoy was encouraged to speak with her providers & pharmacy staff about any need of seasonal treatment plan changes  She had exacerbation in march and September 2022 near changes of seasons Her vaccinations are up to date   Plans Patient agrees to care plan and follow  up within the next 7-14 business days  Goals Addressed               This Visit's Progress     Patient Stated     COMPLETED: [facility]) Manage My Medicine (pt-stated)   On track     Timeframe:  Short-Term Goal Priority:  High Start Date:            06/14/21                 Expected End Date:          729/22             Completed on 07/29/21   - call for medicine refill 2 or 3 days before it runs out  -call upstream to get medicine assistance      Notes: 09/14/21 reports resolution to medication issues 06/28/21 she reports no outreach from upstream since the previous conference outreach but reports she is doing well, denies concerns 06/14/21 pt called left message about a charge for medicine she reports she can not afford. Agreed to conference call to upstream to review this. To work with [facility] [employer] upstream staff about this concern 04/28/21 She confirms she has her medications and this goal is resolved  Unsuccessful outreaches on 04/13/21, 04/21/21, 04/28/21  03/23/21 denies worsening symptoms Denies care coordination or medical needs awaiting [facility] RN visit, niece not doing well, reports upstream outreached, has inhalers and folic acid  05/06/59 pt inquiring about folic acid that is needed Will purchase  folic acid for local discount store/pharmacy 03/02/21 [facility] RN CM response from pulmonologist and outreach to pcp nurse related to Zithromax not filled by pt. 03/01/21 states having financial concern with filling Zithromax. - discuss outreach to MDs       Track and Manage My Symptoms (pt-stated)   Not on track     Follow Up Date 09/21/21  Barriers: Knowledge   - develop a rescue plan - eliminate symptom triggers at home - follow rescue plan if symptoms flare-up - keep follow-up appointments    Notes:  09/14/21 pt able to verbalize compliance with COPD action plan Pt will speak with providers and pharmacy staff about any need of a seasonal treatment plan change         Daje Stark L. [name], RN,  BSN, [place] Coordinator Office number [phone] Main [facility] number [phone] Fax number [phone]

## 2021-09-15 ENCOUNTER — Encounter: Payer: Self-pay | Admitting: Primary Care

## 2021-09-15 ENCOUNTER — Ambulatory Visit (INDEPENDENT_AMBULATORY_CARE_PROVIDER_SITE_OTHER): Payer: Medicare Other | Admitting: Primary Care

## 2021-09-15 VITALS — BP 118/72 | HR 92 | Temp 98.8°F | Ht 62.0 in | Wt 152.6 lb

## 2021-09-15 DIAGNOSIS — J849 Interstitial pulmonary disease, unspecified: Secondary | ICD-10-CM | POA: Diagnosis not present

## 2021-09-15 DIAGNOSIS — J4541 Moderate persistent asthma with (acute) exacerbation: Secondary | ICD-10-CM | POA: Diagnosis not present

## 2021-09-15 DIAGNOSIS — B379 Candidiasis, unspecified: Secondary | ICD-10-CM | POA: Diagnosis not present

## 2021-09-15 DIAGNOSIS — J449 Chronic obstructive pulmonary disease, unspecified: Secondary | ICD-10-CM

## 2021-09-15 MED ORDER — PREDNISONE 10 MG PO TABS: ORAL_TABLET | ORAL | 0 refills | Status: AC

## 2021-09-15 MED ORDER — FLUCONAZOLE 150 MG PO TABS: 150.0000 mg | ORAL_TABLET | Freq: Every day | ORAL | 0 refills | Status: AC

## 2021-09-15 NOTE — Assessment & Plan Note (Signed)
-   At risk for vaginal candidiasis d/t prolonged steroid course, RX diflucan 150mg  x 1

## 2021-09-15 NOTE — Assessment & Plan Note (Signed)
See above

## 2021-09-15 NOTE — Patient Instructions (Addendum)
Recommendations: Continue Breztri aerosphere two puffs morning and evening  Continue Singulair 10mg  daily Continue flonase nasal spray Continue mucinex 600mg  twice daily Use flutter valve three times a day Take prednisone taper as directed (20mg  x 5 days; 10mg  x 5 days; 5mg  x 5 days; stop)  Orders: No labs needed today  Needs paperwork for [facility] patient assistance  PFTs (ordered) HRCT in 1 month (ordered)   RX: Diflucan 150mg  x 1  Prednisone taper   Follow-up: 1 month with Dr. Lamonte Sakai  (1 hour PFTs prior)

## 2021-09-15 NOTE — Assessment & Plan Note (Addendum)
-   Hx RA. Serologies negative except for mild positive cytoplasmic ANA. She saw Dr. Vaughan Browner in 2021 and not recommended any specific treatment for ILD unless there is progression. Needs repeat PFTs and HRCT to assess for worsening interstital disease.

## 2021-09-15 NOTE — Assessment & Plan Note (Addendum)
-   Treated out-patient for COPD/asthma exacerbation on 09/01/21 with Doxycycline + oral prednisone, eventually needing admission to the hospital for 3 days d/t worsening symptoms. Completed 5 day course of azithromycin and discharged on prednisone taper. She is slowly improving but continues to have wheezing and cough. LS with scattered coarse rhonchi/wheezing throughout lung fields. Extending prednisone taper (20mg  x 5 days; 10mg  x 5 days; 5mg  x 5 days). Advised she continue [employer], mucinex and flutter valve. Patient was given patient assistance paper work today for [employer] along with additional sample. Needs fu in 1 month with Dr. Lamonte Sakai.

## 2021-09-21 ENCOUNTER — Other Ambulatory Visit: Payer: Self-pay

## 2021-09-21 ENCOUNTER — Other Ambulatory Visit: Payer: Self-pay | Admitting: *Deleted

## 2021-09-21 NOTE — Patient Outreach (Signed)
[facility]) Care Management  09/21/2021  [name] 18-Sep-1946 [id number]   [facility] outreach for post hospital follow up [name] was referred to [facility] on 02/25/21 by [facility] hospital liaison Referral Reason: for post hospital/complex care services Pt previously with [facility] disease management J Wine prior to admission   Insurance: united healthcare medicare AARP /blue cross and blue shield state PPO     Outreach to the home number [phone]   [name] is able to verify her HIPAA identifiers     Assessment [name] is doing much better today  No audible wheezing nor congestion She reports she is out of her home completing errands and was able to go to church on Sunday  She confirms she has been diligently following her COPD action plan, using her incentive spirometry and follow pandemic precautions She looks forward to her MD follow appointments  She was reminded to discuss a seasonal COPD action plan to avoid triggers  Plans Patient agrees to care plan and follow up within the next 30 business days Pt encouraged to return a call to [facility] RN CM prn  Goals Addressed               This Visit's Progress     Patient Stated     Track and Manage My Symptoms (pt-stated)   On track     Follow Up Date 10/06/21  Barriers: Knowledge   - develop a rescue plan - eliminate symptom triggers at home - follow rescue plan if symptoms flare-up - keep follow-up appointments     Notes:  09/21/21 pt feeling better, pending follow up appointments, She confirms she has been diligently following her COPD action plan, using her incentive spirometry and follow pandemic precautions 09/14/21 pt able to verbalize compliance with COPD action plan Pt will speak with providers and pharmacy staff about any need of a seasonal treatment plan change       [name] L. [name], RN, BSN, [place] Coordinator Office number [phone] Main [facility] number [phone] Fax number [phone]

## 2021-09-23 DIAGNOSIS — Z23 Encounter for immunization: Secondary | ICD-10-CM | POA: Diagnosis not present

## 2021-09-23 DIAGNOSIS — R634 Abnormal weight loss: Secondary | ICD-10-CM | POA: Diagnosis not present

## 2021-09-23 DIAGNOSIS — J4541 Moderate persistent asthma with (acute) exacerbation: Secondary | ICD-10-CM | POA: Diagnosis not present

## 2021-10-04 ENCOUNTER — Ambulatory Visit (INDEPENDENT_AMBULATORY_CARE_PROVIDER_SITE_OTHER)
Admission: RE | Admit: 2021-10-04 | Discharge: 2021-10-04 | Disposition: A | Discharge status: Home / Self Care | Payer: Medicare Other | Source: Ambulatory Visit | Attending: Primary Care | Admitting: Primary Care

## 2021-10-04 ENCOUNTER — Other Ambulatory Visit: Payer: Self-pay

## 2021-10-04 DIAGNOSIS — D473 Essential (hemorrhagic) thrombocythemia: Secondary | ICD-10-CM

## 2021-10-04 DIAGNOSIS — K449 Diaphragmatic hernia without obstruction or gangrene: Secondary | ICD-10-CM | POA: Diagnosis not present

## 2021-10-04 DIAGNOSIS — J849 Interstitial pulmonary disease, unspecified: Secondary | ICD-10-CM | POA: Diagnosis not present

## 2021-10-04 DIAGNOSIS — J432 Centrilobular emphysema: Secondary | ICD-10-CM | POA: Diagnosis not present

## 2021-10-04 DIAGNOSIS — I2510 Atherosclerotic heart disease of native coronary artery without angina pectoris: Secondary | ICD-10-CM | POA: Diagnosis not present

## 2021-10-04 DIAGNOSIS — J84115 Respiratory bronchiolitis interstitial lung disease: Secondary | ICD-10-CM | POA: Diagnosis not present

## 2021-10-05 ENCOUNTER — Inpatient Hospital Stay (HOSPITAL_BASED_OUTPATIENT_CLINIC_OR_DEPARTMENT_OTHER): Payer: Medicare Other | Admitting: Hematology & Oncology

## 2021-10-05 ENCOUNTER — Encounter: Payer: Self-pay | Admitting: Hematology & Oncology

## 2021-10-05 ENCOUNTER — Inpatient Hospital Stay: Payer: Medicare Other | Attending: Hematology & Oncology

## 2021-10-05 VITALS — BP 122/64 | HR 86 | Temp 98.4°F | Resp 18 | Wt 156.0 lb

## 2021-10-05 DIAGNOSIS — F17200 Nicotine dependence, unspecified, uncomplicated: Secondary | ICD-10-CM | POA: Diagnosis not present

## 2021-10-05 DIAGNOSIS — D473 Essential (hemorrhagic) thrombocythemia: Secondary | ICD-10-CM

## 2021-10-05 DIAGNOSIS — Z880 Allergy status to penicillin: Secondary | ICD-10-CM | POA: Diagnosis not present

## 2021-10-05 DIAGNOSIS — Z79899 Other long term (current) drug therapy: Secondary | ICD-10-CM | POA: Insufficient documentation

## 2021-10-05 DIAGNOSIS — R634 Abnormal weight loss: Secondary | ICD-10-CM | POA: Insufficient documentation

## 2021-10-05 DIAGNOSIS — D573 Sickle-cell trait: Secondary | ICD-10-CM | POA: Insufficient documentation

## 2021-10-05 DIAGNOSIS — D75839 Thrombocytosis, unspecified: Secondary | ICD-10-CM | POA: Insufficient documentation

## 2021-10-05 LAB — CBC WITH DIFFERENTIAL (CANCER CENTER ONLY)
Abs Immature Granulocytes: 0.06 10*3/uL (ref 0.00–0.07)
Basophils Absolute: 0 10*3/uL (ref 0.0–0.1)
Basophils Relative: 1 %
Eosinophils Absolute: 0 10*3/uL (ref 0.0–0.5)
Eosinophils Relative: 0 %
HCT: 33.2 % — ABNORMAL LOW (ref 36.0–46.0)
Hemoglobin: 11.4 g/dL — ABNORMAL LOW (ref 12.0–15.0)
Immature Granulocytes: 1 %
Lymphocytes Relative: 27 %
Lymphs Abs: 1.9 10*3/uL (ref 0.7–4.0)
MCH: 29.3 pg (ref 26.0–34.0)
MCHC: 34.3 g/dL (ref 30.0–36.0)
MCV: 85.3 fL (ref 80.0–100.0)
Monocytes Absolute: 0.6 10*3/uL (ref 0.1–1.0)
Monocytes Relative: 9 %
Neutro Abs: 4.4 10*3/uL (ref 1.7–7.7)
Neutrophils Relative %: 62 %
Platelet Count: 390 10*3/uL (ref 150–400)
RBC: 3.89 MIL/uL (ref 3.87–5.11)
RDW: 17.9 % — ABNORMAL HIGH (ref 11.5–15.5)
WBC Count: 7 10*3/uL (ref 4.0–10.5)
nRBC: 0 % (ref 0.0–0.2)

## 2021-10-05 LAB — CMP (CANCER CENTER ONLY)
ALT: 13 U/L (ref 0–44)
AST: 14 U/L — ABNORMAL LOW (ref 15–41)
Albumin: 3.4 g/dL — ABNORMAL LOW (ref 3.5–5.0)
Alkaline Phosphatase: 62 U/L (ref 38–126)
Anion gap: 7 (ref 5–15)
BUN: 8 mg/dL (ref 8–23)
CO2: 26 mmol/L (ref 22–32)
Calcium: 9.1 mg/dL (ref 8.9–10.3)
Chloride: 108 mmol/L (ref 98–111)
Creatinine: 0.78 mg/dL (ref 0.44–1.00)
GFR, Estimated: >60 mL/min (ref >60)
Glucose, Bld: 93 mg/dL (ref 70–99)
Potassium: 3.8 mmol/L (ref 3.5–5.1)
Sodium: 141 mmol/L (ref 135–145)
Total Bilirubin: 0.5 mg/dL (ref 0.3–1.2)
Total Protein: 5.8 g/dL — ABNORMAL LOW (ref 6.5–8.1)

## 2021-10-05 LAB — SAVE SMEAR(SSMR), FOR PROVIDER SLIDE REVIEW

## 2021-10-05 LAB — LACTATE DEHYDROGENASE: LDH: 228 U/L — ABNORMAL HIGH (ref 98–192)

## 2021-10-05 NOTE — Progress Notes (Signed)
Hematology and Oncology Follow Up Visit  [name] [id number] [birth year] 75 y.o. 10/05/2021   Principle Diagnosis:  Essential thrombocythemia - JAK2 POSITIVE Sickle cell trait  Current Therapy:   Hydrea 500 mg PO TID - dose changed on 71/16/5790 Folic acid 1 mg by mouth daily Aspirin 81 mg daily    Interim History:  [name] is here today for follow-up.  Is been a year since we last saw her.  Apparently, there was some issues with money.  I told her that we would still see her even if she had no insurance.  I just wanted to reassure her that we are always here to help her out.  She was hospitalized about a month ago.  She had an asthma attack.  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  The 1 person she was helping was smoking and that triggered an asthma attack.  She otherwise has been doing okay.  She had a good weekend.  She had a lot of visitors because of the homecoming for [employer].   She is worried about weight loss.  She is lost 7 pounds since we last saw her a year ago.  Her family doctor is helping her with this.  She had a CT scan of the chest yesterday.  This is a high resolution CT scan.  I like to see if the results are back yet.  There were not.  She is post have a CT of the abdomen pelvis to help figure out where the weight loss might be going.  She has had no fever.  She has had no change in bowel or bladder habits.  She has had no bleeding.  She has had no rashes.  Patient does have sickle cell trait.  She is on folic acid for this.  She had no issues with COVID.  Overall, I would say performance status is ECOG 1.  .    Medications:  Allergies as of 10/05/2021       Reactions   Penicillin G Rash   Other reaction(s): rash Other reaction(s): rash Other reaction(s): rash Other reaction(s): rash   Penicillins Itching, Rash   Did it involve swelling of the face/tongue/throat, SOB, or low BP? N Did it involve sudden or severe  rash/hives, skin peeling, or any reaction on the inside of your mouth or nose? Y Did you need to seek medical attention at a hospital or doctor's office? N When did it last happen? Decades Ago      If all above answers are "NO", may proceed with cephalosporin use.   Pravastatin Other (See Comments)   Other reaction(s): leg cramps Other reaction(s): leg cramps Other reaction(s): leg cramps Other reaction(s): leg cramps        Medication List        Accurate as of October 05, 2021  8:21 AM. If you have any questions, ask your nurse or doctor.          albuterol 108 (90 Base) MCG/ACT inhaler Commonly known as: Proventil HFA Inhale 2 puffs into the lungs every 4 (four) hours as needed for wheezing or shortness of breath. Reported on 12/30/2015   albuterol (2.5 MG/3ML) 0.083% nebulizer solution Commonly known as: PROVENTIL Take 3 mLs (2.5 mg total) by nebulization every 4 (four) hours as needed for shortness of breath.   arformoterol 15 MCG/2ML Nebu Commonly known as: BROVANA Take 2 mLs (15 mcg total) by nebulization 2 (two) times daily.  aspirin EC 81 MG tablet Take 81 mg by mouth daily.   Breztri Aerosphere 160-9-4.8 MCG/ACT Aero Generic drug: Budeson-Glycopyrrol-Formoterol Inhale 2 puffs into the lungs in the morning and at bedtime.   budesonide 0.25 MG/2ML nebulizer solution Commonly known as: PULMICORT Take 2 mLs (0.25 mg total) by nebulization 2 (two) times daily.   CALCIUM-CHOLECALCIFEROL PO Take 1 tablet by mouth daily at 6 (six) AM.   CENTRUM SILVER 50+WOMEN PO Take 1 tablet by mouth daily.   cetirizine 10 MG tablet Commonly known as: ZyrTEC Allergy Take 1 tablet (10 mg total) by mouth daily.   cholecalciferol 1000 units tablet Commonly known as: VITAMIN D Take 1,000 Units by mouth daily.   fluticasone 50 MCG/ACT nasal spray Commonly known as: FLONASE Place 1 spray into the nose 2 (two) times daily.   folic acid 1 MG tablet Commonly known as:  FOLVITE Take 1 tablet (1 mg total) by mouth daily.   gabapentin 100 MG capsule Commonly known as: NEURONTIN Take 100 mg by mouth at bedtime.   hydroxyurea 500 MG capsule Commonly known as: HYDREA Take 1 capsule (500 mg total) by mouth 3 (three) times daily before meals.   Magnesium 250 MG Tabs Take 1 tablet by mouth daily.   montelukast 10 MG tablet Commonly known as: SINGULAIR Take 1 tablet (10 mg total) by mouth at bedtime.   pantoprazole 40 MG tablet Commonly known as: PROTONIX Take 1 tablet (40 mg total) by mouth 2 (two) times daily. Take 30- 60 min before your first and last meals of the day   revefenacin 175 MCG/3ML nebulizer solution Commonly known as: YUPELRI Take 3 mLs (175 mcg total) by nebulization daily.   rosuvastatin 10 MG tablet Commonly known as: CRESTOR Take 10 mg by mouth at bedtime.        Allergies:  Allergies  Allergen Reactions   Penicillin G Rash    Other reaction(s): rash Other reaction(s): rash Other reaction(s): rash Other reaction(s): rash   Penicillins Itching and Rash    Did it involve swelling of the face/tongue/throat, SOB, or low BP? N Did it involve sudden or severe rash/hives, skin peeling, or any reaction on the inside of your mouth or nose? Y Did you need to seek medical attention at a hospital or doctor's office? N When did it last happen? Decades Ago      If all above answers are "NO", may proceed with cephalosporin use.     Pravastatin Other (See Comments)    Other reaction(s): leg cramps Other reaction(s): leg cramps Other reaction(s): leg cramps Other reaction(s): leg cramps    Past Medical History, Surgical history, Social history, and Family History were reviewed and updated.  Review of Systems:   Review of Systems  Constitutional: Negative.   HENT: Negative.    Eyes: Negative.   Respiratory: Negative.    Cardiovascular: Negative.   Gastrointestinal: Negative.   Genitourinary: Negative.   Musculoskeletal:  Negative.   Skin: Negative.   Neurological: Negative.   Endo/Heme/Allergies: Negative.   Psychiatric/Behavioral: Negative.      Physical Exam:  weight is 156 lb (70.8 kg). Her oral temperature is 98.4 F (36.9 C). Her blood pressure is 122/64 and her pulse is 86. Her respiration is 18 and oxygen saturation is 100%.   Wt Readings from Last 3 Encounters:  10/05/21 156 lb (70.8 kg)  09/15/21 152 lb 9.6 oz (69.2 kg)  09/03/21 157 lb (71.2 kg)   Physical Activity: Inactive   Days of Exercise  per Week: 0 days   Minutes of Exercise per Session: 0 min     Physical Exam Vitals reviewed.  HENT:     Head: Normocephalic and atraumatic.  Eyes:     Pupils: Pupils are equal, round, and reactive to light.  Cardiovascular:     Rate and Rhythm: Normal rate and regular rhythm.     Heart sounds: Normal heart sounds.  Pulmonary:     Effort: Pulmonary effort is normal.     Breath sounds: Normal breath sounds.  Abdominal:     General: Bowel sounds are normal.     Palpations: Abdomen is soft.  Musculoskeletal:        General: No tenderness or deformity. Normal range of motion.     Cervical back: Normal range of motion.  Lymphadenopathy:     Cervical: No cervical adenopathy.  Skin:    General: Skin is warm and dry.     Findings: No erythema or rash.  Neurological:     Mental Status: She is alert and oriented to person, place, and time.  Psychiatric:        Behavior: Behavior normal.        Thought Content: Thought content normal.        Judgment: Judgment normal.     Lab Results  Component Value Date   WBC 7.0 10/05/2021   HGB 11.4 (L) 10/05/2021   HCT 33.2 (L) 10/05/2021   MCV 85.3 10/05/2021   PLT 390 10/05/2021   Lab Results  Component Value Date   FERRITIN 168 05/19/2020   IRON 69 05/19/2020   TIBC 215 (L) 05/19/2020   UIBC 146 05/19/2020   IRONPCTSAT 32 05/19/2020   Lab Results  Component Value Date   RETICCTPCT 0.8 06/20/2019   RBC 3.89 10/05/2021   RETICCTABS  73.1 08/06/2015   No results found for: [facility] Lab Results  Component Value Date   IGGSERUM 685 09/07/2020   IGA 293 09/07/2020   IGMSERUM 26 09/07/2020   No results found for: Odetta Pink, SPEI   Chemistry      Component Value Date/Time   NA 141 10/05/2021 0737   NA 145 11/24/2017 0754   NA 142 05/25/2017 0755   K 3.8 10/05/2021 0737   K 3.7 11/24/2017 0754   K 3.9 05/25/2017 0755   CL 108 10/05/2021 0737   CL 104 11/24/2017 0754   CO2 26 10/05/2021 0737   CO2 27 11/24/2017 0754   CO2 24 05/25/2017 0755   BUN 8 10/05/2021 0737   BUN 12 11/24/2017 0754   BUN 13.3 05/25/2017 0755   CREATININE 0.78 10/05/2021 0737   CREATININE 1.0 11/24/2017 0754   CREATININE 0.8 05/25/2017 0755      Component Value Date/Time   CALCIUM 9.1 10/05/2021 0737   CALCIUM 9.1 11/24/2017 0754   CALCIUM 9.5 05/25/2017 0755   ALKPHOS 62 10/05/2021 0737   ALKPHOS 70 11/24/2017 0754   ALKPHOS 74 05/25/2017 0755   AST 14 (L) 10/05/2021 0737   AST 21 05/25/2017 0755   ALT 13 10/05/2021 0737   ALT 18 11/24/2017 0754   ALT 14 05/25/2017 0755   BILITOT 0.5 10/05/2021 0737   BILITOT 0.37 05/25/2017 0755       Impression and Plan: Ms. Tschantz is a very pleasant 75 yo African American female with essential thrombocythemia, that is JAK2 positive.  In addition, she has the sickle cell trait.  Her platelet count is doing  pretty well right now.  As such, we are not going to make any changes with her Hydrea medicine.  I hope that we will be able to get her back sooner than 1 year.  I really think this would be helpful for her.  Would like to get her back after the holiday season.  I know she will be quite busy over the holidays as she has a large family     [name], MD 11/1/20228:21 AM

## 2021-10-06 ENCOUNTER — Other Ambulatory Visit: Payer: Self-pay | Admitting: *Deleted

## 2021-10-06 NOTE — Patient Outreach (Signed)
[facility]) Care Management  10/06/2021  [name] 1946-07-18 [id number]   [facility] Unsuccessful outreach  [facility] outreach for post hospital follow up [name] was referred to [facility] on 02/25/21 by [facility] hospital liaison Referral Reason: for post hospital/complex care services Pt previously with [facility] disease management J Wine prior to admission   Insurance: united healthcare medicare AARP /blue cross and blue shield state PPO     Outreach attempt to the home number [phone] No answer. [facility] RN CM left HIPAA [facility] Portability and Accountability Act) compliant voicemail message along with CM's contact info.   Plan: [facility] RN CM scheduled this patient for another call attempt within 4-7 business days Unsuccessful outreach on 11/05/21   Wynn Kernes L. [name], RN, BSN, [place] Coordinator Office number [phone] Mobile number [phone]  Main [facility] number [phone] Fax number [phone]

## 2021-10-11 NOTE — Progress Notes (Signed)
Please let patient know HRCT did not show any progression of ILD. No specific treatment recommend d/t lack of progression on imaging. We will monitor lung function with PFTs that were ordered.   She also has mild-moderate emphysema, one vessel coronary artery disease and moderate hiatal hernia.   In regards to emphysema continue Breztri and stay away from cigarettes. CAD continue cholesterol medication and hernia if having reflux should see GI.

## 2021-10-12 ENCOUNTER — Telehealth: Payer: Self-pay | Admitting: Emergency Medicine

## 2021-10-12 NOTE — Telephone Encounter (Signed)
Routing to Amanda as an FYI. 

## 2021-10-12 NOTE — Telephone Encounter (Signed)
Patient dropped off [employer] application for free astrazeneca meds. Placed in Dr. Sudie Bailey box.  Patient has a PFT and OV with Dr. Lamonte Sakai on 11/10.  Please advise.

## 2021-10-14 ENCOUNTER — Other Ambulatory Visit: Payer: Self-pay

## 2021-10-14 ENCOUNTER — Encounter: Payer: Self-pay | Admitting: Emergency Medicine

## 2021-10-14 ENCOUNTER — Other Ambulatory Visit: Payer: Self-pay | Admitting: *Deleted

## 2021-10-14 ENCOUNTER — Ambulatory Visit (INDEPENDENT_AMBULATORY_CARE_PROVIDER_SITE_OTHER): Payer: Medicare Other | Admitting: Emergency Medicine

## 2021-10-14 DIAGNOSIS — J449 Chronic obstructive pulmonary disease, unspecified: Secondary | ICD-10-CM

## 2021-10-14 DIAGNOSIS — K219 Gastro-esophageal reflux disease without esophagitis: Secondary | ICD-10-CM

## 2021-10-14 DIAGNOSIS — J849 Interstitial pulmonary disease, unspecified: Secondary | ICD-10-CM | POA: Diagnosis not present

## 2021-10-14 DIAGNOSIS — J309 Allergic rhinitis, unspecified: Secondary | ICD-10-CM

## 2021-10-14 DIAGNOSIS — J383 Other diseases of vocal cords: Secondary | ICD-10-CM

## 2021-10-14 LAB — PULMONARY FUNCTION TEST
DL/VA % pred: 109 %
DL/VA: 4.54 ml/min/mmHg/L
DLCO cor % pred: 86 %
DLCO cor: 15.44 ml/min/mmHg
DLCO unc % pred: 86 %
DLCO unc: 15.44 ml/min/mmHg
FEF 25-75 Post: 0.7 L/sec
FEF 25-75 Pre: 0.7 L/sec
FEF2575-%Change-Post: 0 %
FEF2575-%Pred-Post: 52 %
FEF2575-%Pred-Pre: 52 %
FEV1-%Change-Post: 2 %
FEV1-%Pred-Post: 89 %
FEV1-%Pred-Pre: 87 %
FEV1-Post: 1.34 L
FEV1-Pre: 1.31 L
FEV1FVC-%Change-Post: 0 %
FEV1FVC-%Pred-Pre: 80 %
FEV6-%Change-Post: 1 %
FEV6-%Pred-Post: 114 %
FEV6-%Pred-Pre: 113 %
FEV6-Post: 2.13 L
FEV6-Pre: 2.1 L
FEV6FVC-%Change-Post: -1 %
FEV6FVC-%Pred-Post: 102 %
FEV6FVC-%Pred-Pre: 104 %
FVC-%Change-Post: 3 %
FVC-%Pred-Post: 112 %
FVC-%Pred-Pre: 108 %
FVC-Post: 2.18 L
FVC-Pre: 2.12 L
Post FEV1/FVC ratio: 61 %
Post FEV6/FVC ratio: 98 %
Pre FEV1/FVC ratio: 62 %
Pre FEV6/FVC Ratio: 99 %
RV % pred: 164 %
RV: 3.63 L
TLC % pred: 110 %
TLC: 5.24 L

## 2021-10-14 MED ORDER — BREZTRI AEROSPHERE 160-9-4.8 MCG/ACT IN AERO: 2.0000 | INHALATION_SPRAY | Freq: Two times a day (BID) | RESPIRATORY_TRACT | 0 refills | Status: DC

## 2021-10-14 MED ORDER — BREZTRI AEROSPHERE 160-9-4.8 MCG/ACT IN AERO: 2.0000 | INHALATION_SPRAY | Freq: Two times a day (BID) | RESPIRATORY_TRACT | 11 refills | Status: DC

## 2021-10-14 NOTE — Progress Notes (Deleted)
Full PFT completed today ? ?

## 2021-10-14 NOTE — Assessment & Plan Note (Signed)
Mild obstruction on her pulmonary function testing today.  Plan to continue her respiratory, albuterol as needed

## 2021-10-14 NOTE — Assessment & Plan Note (Signed)
Continue Protonix 40 mg twice a day.  Take this medication 1 hour around food. 

## 2021-10-14 NOTE — Assessment & Plan Note (Signed)
Continue Zyrtec once daily Continue Singulair once each evening Continue fluticasone nasal spray, 2 sprays each nostril once daily. Follow with APP in 2 months Follow Dr. Lamonte Sakai in 6 months or sooner if you have any problems.

## 2021-10-14 NOTE — Telephone Encounter (Signed)
Rx printed and signed by Dr. Lamonte Sakai and faxed to [employer]. Will leave encounter open for f/u documenting.

## 2021-10-14 NOTE — Progress Notes (Signed)
Full PFT completed today ? ?

## 2021-10-14 NOTE — Patient Outreach (Signed)
[facility]) Care Management  10/14/2021  [name] May 20, 1946 [id number]  [facility] outreach for post hospital follow up [name] was referred to [facility] on 02/25/21 by [facility] hospital liaison Referral Reason: for post hospital/complex care services Pt previously with [facility] disease management J Wine prior to admission   Insurance: united healthcare medicare AARP /blue cross and blue shield state PPO     Outreach to the home number [phone]   [name] Jakubek is able to verify her HIPAA identifiers     Assessment [name] Eimer is  reports she is doing better overall  Goals Addressed               This Visit's Progress     Patient Stated     COMPLETED: Track and Manage My Symptoms (pt-stated)   On track     Resolving due to duplicate goal 81/44/81  Barriers: Knowledge   - develop a rescue plan - eliminate symptom triggers at home - follow rescue plan if symptoms flare-up - keep follow-up appointments     Notes:  10/14/21 Resolving due to duplicate goal-doing much better today noted with intermittent clearing of her throat during this outreach. No audible wheezing noted. was able to speak briefly today as she reports she is preparing to visit her pulmonologist to complete a breathing test, She was reminded to inquire about detailed plan of care/steps especially for a change of season. She has her inhaler and reports a patient assistance form. She was encouraged to discuss this cost concern with her provider today 10/06/21 unsuccessful outreach 09/21/21 pt feeling better, pending follow up appointments, She confirms she has been diligently following her COPD action plan, using her incentive spirometry and follow pandemic precautions 09/14/21 pt able to verbalize compliance with COPD action plan Pt will speak with providers and pharmacy staff about any need of a seasonal treatment plan change        Care Plan : COPD (Adult)  Updates made by Barbaraann Faster, RN since 10/14/2021 12:00 AM     Problem: Symptom Exacerbation (COPD) Resolved 10/14/2021  Priority: High  Onset Date: 09/14/2021     Long-Range Goal: Symptom Exacerbation Prevented or Minimized Completed 10/14/2021  Start Date: 09/14/2021  Expected End Date: 12/04/2021  This Visit's Progress: On track  Recent Progress: On track  Priority: High  Note:   Notes:   Task: Identify and Minimize Risk of COPD Exacerbation Completed 10/14/2021  Due Date: 12/04/2021  Outcome: Positive  Responsible User: Barbaraann Faster, RN  Note:   Care Management Activities:  10/14/21 Resolving due to duplicate goal 85/6/31 no answer call dropped unsuccessful outreach  09/21/21 doing better shopping reminded her to speak with MD about seasonal plan/trigger 09/14/21- barriers to lifestyle changes reviewed and addressed - barriers to treatment reviewed and addressed - healthy lifestyle promoted - modification of home and work environment promoted - rescue (action) plan reviewed - signs/symptoms of infection reviewed - signs/symptoms of worsening disease assessed - symptom triggers identified - treatment plan reviewed    Notes:    Care Plan : [place] of Care  Updates made by Barbaraann Faster, RN since 10/14/2021 12:00 AM     Problem: Complex Care Coordination Needs and disease management in patient with COPD, CKD   Priority: High     Long-Range Goal: Establish Plan of Care for Management Complex SDOH Barriers, disease management and Care Coordination Needs in patient with   Start  Date: 10/14/2021  This Visit's Progress: On track  Priority: High  Note:   Current Barriers:  Knowledge Deficits related to plan of care for management of COPD and CKD Stage II Care Coordination needs related to Financial constraints related to medicines/inhalers and Limited education about COPD, CKD* Difficulty obtaining medications  RN CM Clinical Goal(s):  Patient will verbalize  understanding of plan for management of COPD and CKD Stage II demonstrate a decrease in COPD exacerbations as evidence by decrease ED visits and admissions  through collaboration with RN Care manager, provider, and care team.   Interventions: Continue to assess and reassess for worsening symptoms and encourage MD office outreach to prevent COPD exacerbation Inter-disciplinary care team collaboration (see longitudinal plan of care) Evaluation of current treatment plan related to  self management and patient's adherence to plan as established by provider  COPD Interventions:   Provided patient with basic written and verbal COPD education on self care/management/and exacerbation prevention; Advised patient to track and manage COPD triggers;  Advised patient to self assesses COPD action plan zone and make appointment with provider if in the yellow zone for 48 hours without improvement; Assessed social determinant of health barriers;    Chronic Kidney Disease Interventions:  (Status:  No needs identified this visit. Assessed the Patient understanding of chronic kidney disease    Evaluation of current treatment plan related to chronic kidney disease self management and patient's adherence to plan as established by provider      Reviewed medications with patient and discussed importance of compliance    Last practice recorded BP readings:  BP Readings from Last 3 Encounters:  10/05/21 122/64  09/15/21 118/72  09/06/21 127/74  Most recent eGFR/CrCl:  Lab Results  Component Value Date   EGFR 82 (L) 05/25/2017    No components found for: CRCL  Patient Goals/Self-Care Activities: Patient will self administer medications as prescribed Patient will attend all scheduled provider appointments Patient will call pharmacy for medication refills Patient will attend church or other social activities Patient will continue to perform ADL's independently Patient will continue to perform IADL's  independently Patient will call provider office for new concerns or questions  Follow Up Plan:  The patient has been provided with contact information for the care management team and has been advised to call with any health related questions or concerns.  The care management team will reach out to the patient again over the next 30 business days.     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/2021   Cramp and spasm 03/01/2021   Cramp in lower leg associated with rest 03/01/2021   Hardening of the aorta (main artery of the heart) ([place]) 03/01/2021   Leukopenia 03/01/2021   Menopause 03/01/2021   Multiple joint pain 03/01/2021   Obesity 03/01/2021   Seropositive rheumatoid arthritis ([place]) 03/01/2021   Severe major depression, single episode, without psychotic features ([place]) 03/01/2021   Moderate persistent asthma with (acute) exacerbation 02/22/2021   Hyperglycemia 02/21/2021   Asthma-COPD overlap syndrome ([place]) 07/08/2020   On methotrexate therapy 07/10/2018   Abnormal laboratory test 03/29/2018   Sickle cell trait ([place]) 01/20/2017   JAK-2 gene mutation 12/06/2016   AKI (acute kidney injury) ([place]) 11/24/2016   Right shoulder pain 11/24/2016   Acute respiratory distress 11/24/2016   RLS (restless  legs syndrome) 11/24/2016   Restless leg syndrome 10/13/2016   Primary osteoarthritis of left knee 02/18/2015   Shingles 12/19/2014   ILD (interstitial lung disease) ([place])    Essential thrombocythemia ([place]) 12/24/2013   S/P left knee arthroscopy 11/11/2013   TIA (transient ischemic attack) 09/01/2012   Chest pain 09/01/2012   Arm numbness left 06/03/2012   Migraine 05/31/2012   Dyspnea on exertion 08/09/2011   DDD (degenerative disc disease), lumbar 07/20/2011   DJD (degenerative  joint disease), lumbar 07/20/2011   CVA (cerebral infarction) 07/20/2011   Syncope and collapse 07/12/2011   Acute lumbar radiculopathy 07/12/2011   Encounter for general adult medical examination without abnormal findings 07/10/2011   TRIGGER FINGER, RIGHT MIDDLE 01/11/2011   Other dysphagia 01/11/2011   MENOPAUSAL DISORDER 07/05/2010   OSTEOARTHRITIS, KNEES, BILATERAL 07/05/2010   NEVUS 05/20/2010   VAGINITIS 03/23/2010   SUPERFICIAL THROMBOPHLEBITIS 03/20/2009   FATIGUE 03/20/2009   DYSPHAGIA UNSPECIFIED 02/16/2009   Snoring 11/05/2008   HERPES ZOSTER 06/30/2008   Diabetes ([place]) 06/30/2008   Mixed hyperlipidemia 06/30/2008   ESOPHAGEAL STRICTURE 06/30/2008   FOOT PAIN, RIGHT 06/30/2008   OSTEOPOROSIS 06/30/2008   COLONIC POLYPS, HX OF 06/30/2008   PANIC ATTACK 09/05/2007   Allergic rhinitis 09/05/2007   Vocal cord dysfunction 09/05/2007   Gastroesophageal reflux disease without esophagitis 09/05/2007

## 2021-10-14 NOTE — Assessment & Plan Note (Signed)
Some apical scar but no clear evidence for overt ILD.  No UIP or NSIP pattern.  Follow with chest x-ray since she has rheumatoid arthritis.

## 2021-10-14 NOTE — Addendum Note (Signed)
Addended by: Konrad Felix L on: 10/14/2021 03:36 PM   Modules accepted: Orders

## 2021-10-14 NOTE — Progress Notes (Signed)
Subjective:    Patient ID: [name], female    DOB: 01-27-1946   MRN: [id number] HPI  ROV 07/27/20 --Ms. Twining is 17 with a history of RA formerly on methotrexate, moderate persistent asthma and chronic cough and upper airway irritation syndrome/VCD.  We did not find ILD on high-resolution CT scan of the chest from May 2021.  Her pulmonary function testing shows severe obstruction, possible associated restriction.  We have been managing her on Breztri.  Her cough and stridor have been labile, has been requiring prednisone recently, short course given 7/13 with Dr. Vaughan Browner and then I extended the course 07/02/2020.  Her pred finished last week. Her omeprazole was changed to protonix. She feels breztri is helping her. She is coughing less, voice is stronger. Still coughs daily. Has dyspnea and cough w exertion. She is using albuterol nebs 1-2x a day.  COVID vaccine up to date.   Alpha 1 antitrypsin 7/13: MM, CBC 07/06/2020 without eosinophils ANA positive cytoplasmic pattern, 1: 40. IgE 15 SCL 70, HSP panel (fungal), ANCA screen, SSA/SSB, all other antibody serologies all negative.    ROV 10/14/21 --follow-up visit for 75 year old woman with a history of rheumatoid arthritis (for methotrexate), moderate persistent asthma and known upper airway irritation syndrome with vocal cord dysfunction.  Significant difficulty managing her chronic cough and stridor.  She been treated multiple times with prednisone since I last saw her including 1 month ago.  She is on Breztri - expensive. She uses albuterol nebs prn, not currently. Working on Public librarian for [employer] She is improved. Her cough has resolved, UA noise is better and voice is stronger.   High-resolution CT scan of the chest 10/04/2021 reviewed by me shows some subtle subpleural upper lobe reticulation without any honeycomb or bronchiectasis.  No overt scarring.  Pulmonary function testing performed today shows mild obstruction  without a bronchodilator response, hyperinflated lung volumes, decreased diffusion capacity that corrects to normal range when adjusted for alveolar volume.  FEV1 is 87% predicted.   Objective:   Physical Exam Vitals:   10/14/21 1421  BP: 112/62  Pulse: 89  SpO2: 100%  Weight: 158 lb (71.7 kg)  Height: 5\' 2"  (1.575 m)   Gen: Pleasant, overwt, in no distress,  normal affect  ENT: No lesions,  mouth clear,  oropharynx clear, no postnasal drip, strong voice  Neck: supple, low pitched inspiratory and expiratory stridor  Lungs : distant, referred upper airway noise, otherwise clear  Cardiovascular: RRR, heart sounds normal, no murmur or gallops, no peripheral edema  Musculoskeletal: No deformities, no cyanosis or clubbing  Neuro: alert, non focal  Skin: Warm, no rash    Assessment & Plan:   Vocal cord dysfunction Severe upper airway irritation syndrome, debilitating when it flares.  This is what drives her dyspnea.  Her asthma is actually quite mild based on PFT.  Plan to try and treat allergic rhinitis, GERD as aggressively as possible.  She does benefit from prednisone when she flares.  Asthma-COPD overlap syndrome (HCC) Mild obstruction on her pulmonary function testing today.  Plan to continue her respiratory, albuterol as needed  Allergic rhinitis Continue Zyrtec once daily Continue Singulair once each evening Continue fluticasone nasal spray, 2 sprays each nostril once daily. Follow with APP in 2 months Follow Dr. Lamonte Sakai in 6 months or sooner if you have any problems.  Gastroesophageal reflux disease without esophagitis Continue Protonix 40 mg twice a day.  Take this medication 1 hour around food.  ILD (interstitial  lung disease) ([place]) Some apical scar but no clear evidence for overt ILD.  No UIP or NSIP pattern.  Follow with chest x-ray since she has rheumatoid arthritis.  Time spent 40 minutes.  Baltazar Apo, MD, PhD 10/14/2021, 2:58 PM [facility] [phone] or if no answer [phone]

## 2021-10-14 NOTE — Assessment & Plan Note (Signed)
Severe upper airway irritation syndrome, debilitating when it flares.  This is what drives her dyspnea.  Her asthma is actually quite mild based on PFT.  Plan to try and treat allergic rhinitis, GERD as aggressively as possible.  She does benefit from prednisone when she flares.

## 2021-10-14 NOTE — Patient Instructions (Addendum)
We reviewed your pulmonary function testing and your CT scan of the chest today. Please continue Breztri 2 puffs twice a day.  Rinse and gargle after using.  We will work on trying to get financial assistance for this. Use your albuterol either 2 puffs or 1 nebulizer treatment when you need it for shortness of breath, chest tightness, wheezing. Continue Zyrtec once daily Continue Singulair once each evening Continue fluticasone nasal spray, 2 sprays each nostril once daily. Continue Protonix 40 mg twice a day.  Take this medication 1 hour around food. Follow with APP in 2 months Follow Dr. Lamonte Sakai in 6 months or sooner if you have any problems.

## 2021-10-26 ENCOUNTER — Other Ambulatory Visit: Payer: Self-pay

## 2021-10-26 ENCOUNTER — Other Ambulatory Visit: Payer: Self-pay | Admitting: *Deleted

## 2021-10-26 NOTE — Patient Outreach (Signed)
[place] [facility]) Care Management  11/04/2021  [name] 22-Jun-1946 [id number]  Summary: Outreach to patient who reports she is doing well but is only able to speak briefly    Subjective: [name] is an 75 y.o. year old female who is a primary patient of Merrilee Seashore, MD. The care management team was consulted for assistance with care management and/or care coordination needs.    Telephonic RN Care Manager completed Telephone Visit today.   Objective:  Medications Reviewed Today     Reviewed by Collene Gobble, MD (Physician) on 10/14/21 at 1454  Med List Status: <None>   Medication Order Taking? Sig Documenting Provider Last Dose Status Informant  0.9 %  sodium chloride infusion [id number]   Irene Shipper, MD  Active   albuterol (PROVENTIL HFA) 108 (90 Base) MCG/ACT inhaler [id number] No Inhale 2 puffs into the lungs every 4 (four) hours as needed for wheezing or shortness of breath. Reported on 12/30/2015  Patient not taking: Reported on 10/14/2021   Elodia Florence., MD Not Taking Active   albuterol (PROVENTIL) (2.5 MG/3ML) 0.083% nebulizer solution [id number] No Take 3 mLs (2.5 mg total) by nebulization every 4 (four) hours as needed for shortness of breath.  Patient not taking: Reported on 10/14/2021   Elodia Florence., MD Not Taking Active   arformoterol [facility]) 15 MCG/2ML NEBU [id number] Yes Take 2 mLs (15 mcg total) by nebulization 2 (two) times daily. Melvenia Needles, NP Taking Active            Med Note [facility], [name]   Sat Sep 04, 2021  1:53 AM)    aspirin EC 81 MG tablet [id number] Yes Take 81 mg by mouth daily. [provider] Taking Active Self  Budeson-Glycopyrrol-Formoterol (BREZTRI AEROSPHERE) 160-9-4.8 MCG/ACT AERO [id number] Yes Inhale 2 puffs into the lungs in the morning and at bedtime. Collene Gobble, MD Taking Active   budesonide (PULMICORT) 0.25 MG/2ML nebulizer solution [id number] Yes Take 2 mLs (0.25 mg  total) by nebulization 2 (two) times daily. Melvenia Needles, NP Taking Active   CALCIUM-CHOLECALCIFEROL PO [id number] Yes Take 1 tablet by mouth daily at 6 (six) AM. [provider] Taking Active Self  cetirizine (ZYRTEC ALLERGY) 10 MG tablet [id number] Yes Take 1 tablet (10 mg total) by mouth daily. Martyn Ehrich, NP Taking Active Self  cholecalciferol (VITAMIN D) 1000 UNITS tablet [id number] Yes Take 1,000 Units by mouth daily. [provider] Taking Active Self  fluticasone (FLONASE) 50 MCG/ACT nasal spray [id number] Yes Place 1 spray into the nose 2 (two) times daily. [provider] Taking Active Self  folic acid (FOLVITE) 1 MG tablet [id number] Yes Take 1 tablet (1 mg total) by mouth daily. Celso Amy, NP Taking Active Self  gabapentin (NEURONTIN) 100 MG capsule [id number] Yes Take 100 mg by mouth at bedtime. [provider] Taking Active Self  hydroxyurea (HYDREA) 500 MG capsule [id number] Yes Take 1 capsule (500 mg total) by mouth 3 (three) times daily before meals. [name], MD Taking Active Self           Med Note Dema Severin, [name]   Sat Sep 04, 2021  2:00 AM)    Magnesium 250 MG TABS [id number] Yes Take 1 tablet by mouth daily. [provider] Taking Active Self  montelukast (SINGULAIR) 10 MG tablet [id number] Yes Take 1 tablet (10 mg total) by mouth at bedtime. Collene Gobble, MD Taking Active Self  Multiple Vitamins-Minerals (CENTRUM SILVER 50+WOMEN PO) [id number] Yes Take 1 tablet by mouth daily. [provider] Taking Active Self  pantoprazole (PROTONIX) 40 MG tablet [id number] Yes Take 1 tablet (40 mg total) by mouth 2 (two) times daily. Take 30- 60 min before your first and last meals of the day Collene Gobble, MD Taking Active Self  revefenacin (YUPELRI) 175 MCG/3ML nebulizer solution [id number] Yes Take 3 mLs (175 mcg total) by nebulization daily. Parrett, Fonnie Mu, NP Taking Active   rosuvastatin (CRESTOR) 10 MG tablet  [id number] Yes Take 10 mg by mouth at bedtime. [provider] Taking Active Self             SDOH:  (Social Determinants of Health) assessments and interventions performed:    Care Plan  Review of patient past medical history, allergies, medications, health status, including review of consultants reports, laboratory and other test data, was performed as part of comprehensive evaluation for care management services.   Care Plan : RN Care Manager Plan of Care  Updates made by Barbaraann Faster, RN since 11/04/2021 12:00 AM     Problem: Complex Care Coordination Needs and disease management in patient with COPD, CKD   Priority: High     Long-Range Goal: Establish Plan of Care for Management Complex SDOH Barriers, disease management and Care Coordination Needs in patient with   Start Date: 10/14/2021  Recent Progress: On track  Priority: High  Note:   Current Barriers:  Knowledge Deficits related to plan of care for management of COPD and CKD Stage II Care Coordination needs related to Financial constraints related to medicines/inhalers and Limited education about COPD, CKD* Difficulty obtaining medications 10/26/21 no worsening symptoms Reports she is doing better Requests later follow up attempt  RN CM Clinical Goal(s):  Patient will verbalize understanding of plan for management of COPD and CKD Stage II demonstrate a decrease in COPD exacerbations as evidence by decrease ED visits and admissions  through collaboration with RN Care manager, provider, and care team.   Interventions: Continue to assess and reassess for worsening symptoms and encourage MD office outreach to prevent COPD exacerbation Inter-disciplinary care team collaboration (see longitudinal plan of care) Evaluation of current treatment plan related to  self management and patient's adherence to plan as established by provider   COPD Interventions:   Provided patient with basic written and verbal  COPD education on self care/management/and exacerbation prevention; Advised patient to track and manage COPD triggers;  Advised patient to self assesses COPD action plan zone and make appointment with provider if in the yellow zone for 48 hours without improvement; Assessed social determinant of health barriers;    Chronic Kidney Disease Interventions:  (Status:  No needs identified this visit. Assessed the Patient understanding of chronic kidney disease    Evaluation of current treatment plan related to chronic kidney disease self management and patient's adherence to plan as established by provider      Reviewed medications with patient and discussed importance of compliance    Last practice recorded BP readings:  BP Readings from Last 3 Encounters:  10/14/21 112/62  10/05/21 122/64  09/15/21 118/72  Most recent eGFR/CrCl:  Lab Results  Component Value Date   EGFR 82 (L) 05/25/2017    No components found for: CRCL  Patient Goals/Self-Care Activities: Patient will self administer medications as prescribed Patient will attend all scheduled provider appointments Patient will call pharmacy for medication refills Patient will attend church or other social activities Patient will  continue to perform ADL's independently Patient will continue to perform IADL's independently Patient will call provider office for new concerns or questions  Follow Up Plan:  The patient has been provided with contact information for the care management team and has been advised to call with any health related questions or concerns.  The care management team will reach out to the patient again over the next 30 business days.       Plan: The care management team will reach out to the patient again over the next 30 business days.  Linsey Hirota L. [name], RN, BSN, [place] Coordinator Office number [phone] Main [facility] number [phone] Fax number [phone]

## 2021-10-26 NOTE — Telephone Encounter (Signed)
Estill Bamberg, please advise if you have an update on this. Thanks!

## 2021-11-04 ENCOUNTER — Other Ambulatory Visit: Payer: Self-pay | Admitting: *Deleted

## 2021-11-04 DIAGNOSIS — N182 Chronic kidney disease, stage 2 (mild): Secondary | ICD-10-CM | POA: Diagnosis not present

## 2021-11-04 DIAGNOSIS — J4520 Mild intermittent asthma, uncomplicated: Secondary | ICD-10-CM | POA: Diagnosis not present

## 2021-11-04 DIAGNOSIS — E782 Mixed hyperlipidemia: Secondary | ICD-10-CM | POA: Diagnosis not present

## 2021-11-04 DIAGNOSIS — D473 Essential (hemorrhagic) thrombocythemia: Secondary | ICD-10-CM | POA: Diagnosis not present

## 2021-11-04 NOTE — Patient Outreach (Signed)
[facility] [facility]) Care Management  11/04/2021  [name] July 28, 1946 [id number]   [facility] Unsuccessful outreach   Outreach attempt to the listed at the preferred outreach number in [facility] Phone answered but no response heard with greeting RN CM returned the call   No answer. [facility] RN CM left HIPAA [facility] Portability and Accountability Act) compliant voicemail message along with CM's contact info.   Plan: [facility] RN CM scheduled this patient for another call attempt within 4-7 business days Unsuccessful outreach on 11/04/21    Shanette Tamargo L. [name], RN, BSN, [place] Coordinator Office number [phone] Mobile number [phone]  Main [facility] number [phone] Fax number [phone]

## 2021-11-11 ENCOUNTER — Other Ambulatory Visit: Payer: Self-pay | Admitting: Internal Medicine

## 2021-11-11 DIAGNOSIS — R634 Abnormal weight loss: Secondary | ICD-10-CM | POA: Diagnosis not present

## 2021-11-11 DIAGNOSIS — G250 Essential tremor: Secondary | ICD-10-CM | POA: Diagnosis not present

## 2021-11-11 DIAGNOSIS — J432 Centrilobular emphysema: Secondary | ICD-10-CM | POA: Diagnosis not present

## 2021-11-11 DIAGNOSIS — M059 Rheumatoid arthritis with rheumatoid factor, unspecified: Secondary | ICD-10-CM | POA: Diagnosis not present

## 2021-11-11 DIAGNOSIS — I700 Atherosclerosis of aorta: Secondary | ICD-10-CM | POA: Diagnosis not present

## 2021-11-11 DIAGNOSIS — D473 Essential (hemorrhagic) thrombocythemia: Secondary | ICD-10-CM | POA: Diagnosis not present

## 2021-11-11 DIAGNOSIS — E782 Mixed hyperlipidemia: Secondary | ICD-10-CM | POA: Diagnosis not present

## 2021-11-11 DIAGNOSIS — R2681 Unsteadiness on feet: Secondary | ICD-10-CM | POA: Diagnosis not present

## 2021-11-17 DIAGNOSIS — M1712 Unilateral primary osteoarthritis, left knee: Secondary | ICD-10-CM | POA: Diagnosis not present

## 2021-11-17 DIAGNOSIS — R2681 Unsteadiness on feet: Secondary | ICD-10-CM | POA: Diagnosis not present

## 2021-11-22 DIAGNOSIS — R2681 Unsteadiness on feet: Secondary | ICD-10-CM | POA: Diagnosis not present

## 2021-11-22 DIAGNOSIS — M1712 Unilateral primary osteoarthritis, left knee: Secondary | ICD-10-CM | POA: Diagnosis not present

## 2021-12-03 ENCOUNTER — Other Ambulatory Visit: Payer: Self-pay

## 2021-12-03 ENCOUNTER — Ambulatory Visit
Admission: RE | Admit: 2021-12-03 | Discharge: 2021-12-03 | Disposition: A | Discharge status: Home / Self Care | Payer: Medicare Other | Source: Ambulatory Visit | Attending: Internal Medicine | Admitting: Internal Medicine

## 2021-12-03 DIAGNOSIS — K449 Diaphragmatic hernia without obstruction or gangrene: Secondary | ICD-10-CM | POA: Diagnosis not present

## 2021-12-03 DIAGNOSIS — I77811 Abdominal aortic ectasia: Secondary | ICD-10-CM | POA: Diagnosis not present

## 2021-12-03 DIAGNOSIS — K4020 Bilateral inguinal hernia, without obstruction or gangrene, not specified as recurrent: Secondary | ICD-10-CM | POA: Diagnosis not present

## 2021-12-03 DIAGNOSIS — R634 Abnormal weight loss: Secondary | ICD-10-CM | POA: Diagnosis not present

## 2021-12-03 MED ORDER — IOPAMIDOL (ISOVUE-300) INJECTION 61%
100.0000 mL | Freq: Once | INTRAVENOUS | Status: AC | PRN
  Administered 2021-12-03: 100 mL via INTRAVENOUS

## 2021-12-08 DIAGNOSIS — R2681 Unsteadiness on feet: Secondary | ICD-10-CM | POA: Diagnosis not present

## 2021-12-08 DIAGNOSIS — M1712 Unilateral primary osteoarthritis, left knee: Secondary | ICD-10-CM | POA: Diagnosis not present

## 2021-12-09 ENCOUNTER — Other Ambulatory Visit: Payer: Self-pay | Admitting: *Deleted

## 2021-12-09 ENCOUNTER — Encounter: Payer: Self-pay | Admitting: *Deleted

## 2021-12-09 DIAGNOSIS — K3189 Other diseases of stomach and duodenum: Secondary | ICD-10-CM | POA: Diagnosis not present

## 2021-12-09 DIAGNOSIS — R6881 Early satiety: Secondary | ICD-10-CM | POA: Diagnosis not present

## 2021-12-09 DIAGNOSIS — R634 Abnormal weight loss: Secondary | ICD-10-CM | POA: Diagnosis not present

## 2021-12-09 NOTE — Patient Outreach (Addendum)
[place] [facility]) Care Management  12/09/2021  [name] 09-Apr-1946 [id number]   [facility] Unsuccessful outreach   Outreach attempt to the listed at the preferred outreach number in [place]  No answer. [facility] RN CM left HIPAA [facility] Portability and Accountability Act) compliant voicemail message along with CMs contact info.   Plan: [facility] RN CM scheduled this patient for another call attempt within 4-7 business days Unsuccessful outreach on 12/09/2021 Unsuccessful outreach letter sent 12/09/2021   Joelene Millin L. [name], RN, BSN, [place] Coordinator Office number [phone] Mobile number [phone]  Main [facility] number [phone] Fax number [phone]

## 2021-12-09 NOTE — Patient Outreach (Signed)
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 She states her pcp plans to complete a pending scheduled endoscopy to check for possibility of malignant neoplasm in stomach.  This CT was ordered after she was noted to lose around 5 lbs She is aware with discussion of her hiatal hernia and .GERD (gastroesophageal reflux disease) She continues to rely on her faith and family support She denies a fever, loss of taste, congestion etc Confirmed she remains with [employer] medicare coverage for 2023 and at this time does not have any medication management issues  Recommendations/Changes made from today's visit: Recommended she continue to follow her asthma action plan, outreach to her MD if worsening symptoms occur especially if she notices any reviewed covid XBB 1.5, Omicron BQ 1.1 symptoms of scratchy throat and fatigue She agrees to outreach to pcp early vs wait in consideration of her hx of asthma, CVA   Subjective: [name] is an 76 y.o. year old female who is a primary patient of Merrilee Seashore, MD. The care management team was consulted for assistance with care management and/or care coordination needs.    Telephonic RN Care Manager completed Telephone Visit today.   Objective:  Medications Reviewed Today     Reviewed by Collene Gobble, MD (Physician) on 10/14/21 at 1454  Med List Status: <None>   Medication Order Taking? Sig Documenting Provider Last  Dose Status Informant  0.9 %  sodium chloride infusion [id number]   Irene Shipper, MD  Active   albuterol (PROVENTIL HFA) 108 (90 Base) MCG/ACT inhaler [id number] No Inhale 2 puffs into the lungs every 4 (four) hours as needed for wheezing or shortness of breath. Reported on 12/30/2015  Patient not taking: Reported on 10/14/2021   Elodia Florence., MD Not Taking Active   albuterol (PROVENTIL) (2.5 MG/3ML) 0.083% nebulizer solution [id number] No Take 3 mLs (2.5 mg total) by nebulization every 4 (four) hours as needed for shortness of breath.  Patient not taking: Reported on 10/14/2021   Elodia Florence., MD Not Taking Active   arformoterol [facility]) 15 MCG/2ML NEBU [id number] Yes Take 2 mLs (15 mcg total) by nebulization 2 (two) times daily. Melvenia Needles, NP Taking Active            Med Note [facility], [name]   Sat Sep 04, 2021  1:53 AM)    aspirin EC 81 MG tablet [id number] Yes Take 81 mg by mouth daily. [provider] Taking Active Self  Budeson-Glycopyrrol-Formoterol (BREZTRI AEROSPHERE) 160-9-4.8 MCG/ACT AERO [id number] Yes Inhale 2 puffs into the lungs in the morning and at bedtime. Collene Gobble, MD Taking Active   budesonide (PULMICORT) 0.25 MG/2ML nebulizer solution [id number] Yes Take 2 mLs (0.25 mg total) by nebulization 2 (two) times daily. Melvenia Needles, NP Taking Active   CALCIUM-CHOLECALCIFEROL PO [id number] Yes Take 1 tablet by mouth daily at 6 (six) AM. [provider] Taking Active Self  cetirizine (  ZYRTEC ALLERGY) 10 MG tablet [id number] Yes Take 1 tablet (10 mg total) by mouth daily. Martyn Ehrich, NP Taking Active Self  cholecalciferol (VITAMIN D) 1000 UNITS tablet [id number] Yes Take 1,000 Units by mouth daily. [provider] Taking Active Self  fluticasone (FLONASE) 50 MCG/ACT nasal spray [id number] Yes Place 1 spray into the nose 2 (two) times daily. [provider] Taking Active Self  folic acid (FOLVITE) 1 MG tablet [id number]  Yes Take 1 tablet (1 mg total) by mouth daily. Celso Amy, NP Taking Active Self  gabapentin (NEURONTIN) 100 MG capsule [id number] Yes Take 100 mg by mouth at bedtime. [provider] Taking Active Self  hydroxyurea (HYDREA) 500 MG capsule [id number] Yes Take 1 capsule (500 mg total) by mouth 3 (three) times daily before meals. [name], MD Taking Active Self           Med Note [facility]in, [name]   Sat Sep 04, 2021  2:00 AM)    Magnesium 250 MG TABS [id number] Yes Take 1 tablet by mouth daily. [provider] Taking Active Self  montelukast (SINGULAIR) 10 MG tablet [id number] Yes Take 1 tablet (10 mg total) by mouth at bedtime. Collene Gobble, MD Taking Active Self  Multiple Vitamins-Minerals (CENTRUM SILVER 50+WOMEN PO) [id number] Yes Take 1 tablet by mouth daily. [provider] Taking Active Self  pantoprazole (PROTONIX) 40 MG tablet [id number] Yes Take 1 tablet (40 mg total) by mouth 2 (two) times daily. Take 30- 60 min before your first and last meals of the day Collene Gobble, MD Taking Active Self  revefenacin (YUPELRI) 175 MCG/3ML nebulizer solution [id number] Yes Take 3 mLs (175 mcg total) by nebulization daily. Parrett, Fonnie Mu, NP Taking Active   rosuvastatin (CRESTOR) 10 MG tablet [id number] Yes Take 10 mg by mouth at bedtime. 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[place]) 04/13/2021   Cramp and spasm 03/01/2021   Cramp in lower leg associated with rest 03/01/2021   Hardening of the aorta (main artery of the heart) ([place]) 03/01/2021   Leukopenia 03/01/2021   Menopause 03/01/2021   Multiple joint pain 03/01/2021   Obesity 03/01/2021   Seropositive rheumatoid arthritis ([place]) 03/01/2021   Severe major  depression, single episode, without psychotic features ([place]) 03/01/2021   Moderate persistent asthma with (acute) exacerbation 02/22/2021   Hyperglycemia 02/21/2021   Asthma-COPD overlap syndrome ([place]) 07/08/2020   On methotrexate therapy 07/10/2018   Abnormal laboratory test 03/29/2018   Sickle cell trait ([place]) 01/20/2017   JAK-2 gene mutation 12/06/2016   AKI (acute kidney injury) ([place]) 11/24/2016   Right shoulder pain 11/24/2016   Acute respiratory distress 11/24/2016   RLS (restless legs syndrome) 11/24/2016   Restless leg syndrome 10/13/2016   Primary osteoarthritis of left knee 02/18/2015   Shingles 12/19/2014   ILD (interstitial lung disease) ([place])    Essential thrombocythemia ([place]) 12/24/2013   S/P left knee arthroscopy 11/11/2013   TIA (transient ischemic attack) 09/01/2012   Chest pain 09/01/2012   Arm numbness left 06/03/2012   Migraine 05/31/2012   Dyspnea on exertion 08/09/2011   DDD (degenerative disc disease), lumbar 07/20/2011   DJD (degenerative joint disease), lumbar 07/20/2011   CVA (cerebral infarction) 07/20/2011  Syncope and collapse 07/12/2011   Acute lumbar radiculopathy 07/12/2011   Encounter for general adult medical examination without abnormal findings 07/10/2011   TRIGGER FINGER, RIGHT MIDDLE 01/11/2011   Other dysphagia 01/11/2011   MENOPAUSAL DISORDER 07/05/2010   OSTEOARTHRITIS, KNEES, BILATERAL 07/05/2010   NEVUS 05/20/2010   VAGINITIS 03/23/2010   SUPERFICIAL THROMBOPHLEBITIS 03/20/2009   FATIGUE 03/20/2009   DYSPHAGIA UNSPECIFIED 02/16/2009   Snoring 11/05/2008   HERPES ZOSTER 06/30/2008   Diabetes ([place]) 06/30/2008   Mixed hyperlipidemia 06/30/2008   ESOPHAGEAL STRICTURE 06/30/2008   FOOT PAIN, RIGHT 06/30/2008   OSTEOPOROSIS 06/30/2008   COLONIC POLYPS, HX OF 06/30/2008   PANIC ATTACK 09/05/2007   Allergic rhinitis 09/05/2007   Vocal cord dysfunction 09/05/2007   Gastroesophageal reflux disease without esophagitis 09/05/2007      SDOH:  (Social Determinants of Health) assessments and interventions performed:  SDOH Interventions    Flowsheet Row Most Recent Value  SDOH Interventions   Food Insecurity Interventions Intervention Not Indicated  Housing Interventions Intervention Not Indicated  Intimate Partner Violence Interventions Intervention Not Indicated  Stress Interventions Intervention Not Indicated  Social Connections Interventions Intervention Not Indicated  Transportation Interventions Intervention Not Indicated       Care Plan  Review of patient past medical history, allergies, medications, health status, including review of consultants reports, laboratory and other test data, was performed as part of comprehensive evaluation for care management services.   Care Plan : COPD (Adult)  Updates made by Barbaraann Faster, RN since 12/09/2021 12:00 AM  Completed 12/09/2021   Care Plan : Wellness (Adult)  Updates made by Barbaraann Faster, RN since 12/09/2021 12:00 AM  Completed 12/09/2021   Care Plan : Asthma (Adult)  Updates made by Barbaraann Faster, RN since 12/09/2021 12:00 AM  Completed 12/09/2021   Care Plan : Depression (Adult)  Updates made by Barbaraann Faster, RN since 12/09/2021 12:00 AM  Completed 12/09/2021   Care Plan : RN Care Manager Plan of Care  Updates made by Barbaraann Faster, RN since 12/09/2021 12:00 AM     Problem: Complex Care Coordination Needs and disease management in patient with COPD, CKD   Priority: High     Long-Range Goal: Establish Plan of Care for Management Complex SDOH Barriers, disease management and Care Coordination Needs in patient with   Start Date: 10/14/2021  This Visit's Progress: On track  Recent Progress: On track  Priority: High  Note:   Current Barriers:  Knowledge Deficits related to plan of care for management of COPD and CKD Stage II Care Coordination needs related to Financial constraints related to medicines/inhalers and Limited education about  COPD, CKD* Difficulty obtaining medications Barriers: Health Behaviors Knowledge 12/09/21 "down" from loss of 2 nephews, cold symptoms managing at home, pending endoscopy for results of 12/03/21 CT Abdomen to r/o malignancy     RN CM Clinical Goal(s):  Patient will verbalize understanding of plan for management of COPD and CKD Stage II demonstrate a decrease in COPD exacerbations as evidence by decrease ED visits and admissions  through collaboration with RN Care manager, provider, and care team.   Interventions: Continue to assess and reassess for worsening symptoms and encourage MD office outreach to prevent COPD exacerbation Inter-disciplinary care team collaboration (see longitudinal plan of care) Evaluation of current treatment plan related to  self management and patient's adherence to plan as established by provider 12/09/21 Reviewed CT Abdomen results, discussed endoscopy, depression screen, remind of asthma action plan, empathy/support, Reviewed covid strain  symptoms and encouraged home monitoring and MD services if cold symptoms worsens/changes   COPD Interventions:   Provided patient with basic written and verbal COPD education on self care/management/and exacerbation prevention; Advised patient to track and manage COPD triggers;  Advised patient to self assesses COPD action plan zone and make appointment with provider if in the yellow zone for 48 hours without improvement; Assessed social determinant of health barriers;    Chronic Kidney Disease Interventions:  (Status:  No needs identified this visit. Assessed the Patient understanding of chronic kidney disease    Evaluation of current treatment plan related to chronic kidney disease self management and patient's adherence to plan as established by provider      Reviewed medications with patient and discussed importance of compliance    Last practice recorded BP readings:  BP Readings from Last 3 Encounters:  10/14/21 112/62   10/05/21 122/64  09/15/21 118/72  Most recent eGFR/CrCl:  Lab Results  Component Value Date   EGFR 82 (L) 05/25/2017    No components found for: CRCL  Patient Goals/Self-Care Activities: Patient will self administer medications as prescribed Patient will attend all scheduled provider appointments Patient will call pharmacy for medication refills Patient will attend church or other social activities Patient will continue to perform ADL's independently Patient will continue to perform IADL's independently Patient will call provider office for new concerns or questions  Follow Up Plan:  The patient has been provided with contact information for the care management team and has been advised to call with any health related questions or concerns.  The care management team will reach out to the patient again over the next 30 business days.       Plan: The patient has been provided with contact information for the care management team and has been advised to call with any health related questions or concerns.  The care management team will reach out to the patient again over the next 30 business days.  Nadalee Neiswender L. [name], RN, BSN, [place] Coordinator Office number [phone] [facility] number [phone] Fax number [phone]

## 2021-12-10 DIAGNOSIS — R2681 Unsteadiness on feet: Secondary | ICD-10-CM | POA: Diagnosis not present

## 2021-12-10 DIAGNOSIS — M1712 Unilateral primary osteoarthritis, left knee: Secondary | ICD-10-CM | POA: Diagnosis not present

## 2021-12-14 ENCOUNTER — Encounter: Payer: Self-pay | Admitting: Nurse Practitioner

## 2021-12-14 ENCOUNTER — Ambulatory Visit (INDEPENDENT_AMBULATORY_CARE_PROVIDER_SITE_OTHER): Payer: Medicare Other | Admitting: Nurse Practitioner

## 2021-12-14 ENCOUNTER — Other Ambulatory Visit: Payer: Self-pay

## 2021-12-14 VITALS — BP 100/68 | HR 87 | Temp 98.2°F | Ht 62.0 in | Wt 153.2 lb

## 2021-12-14 DIAGNOSIS — J383 Other diseases of vocal cords: Secondary | ICD-10-CM | POA: Diagnosis not present

## 2021-12-14 DIAGNOSIS — J309 Allergic rhinitis, unspecified: Secondary | ICD-10-CM | POA: Diagnosis not present

## 2021-12-14 DIAGNOSIS — J449 Chronic obstructive pulmonary disease, unspecified: Secondary | ICD-10-CM

## 2021-12-14 DIAGNOSIS — M059 Rheumatoid arthritis with rheumatoid factor, unspecified: Secondary | ICD-10-CM | POA: Diagnosis not present

## 2021-12-14 MED ORDER — CETIRIZINE HCL 10 MG PO TABS: 10.0000 mg | ORAL_TABLET | Freq: Every day | ORAL | 5 refills | Status: AC

## 2021-12-14 MED ORDER — BREZTRI AEROSPHERE 160-9-4.8 MCG/ACT IN AERO: 2.0000 | INHALATION_SPRAY | Freq: Two times a day (BID) | RESPIRATORY_TRACT | 11 refills | Status: DC

## 2021-12-14 MED ORDER — ALBUTEROL SULFATE HFA 108 (90 BASE) MCG/ACT IN AERS: 2.0000 | INHALATION_SPRAY | Freq: Four times a day (QID) | RESPIRATORY_TRACT | 2 refills | Status: DC | PRN

## 2021-12-14 NOTE — Assessment & Plan Note (Signed)
On methotrexate. No evidence of overt ILD on recent HRCT. Continue to monitor with CXR for surveillance, per Dr Lamonte Sakai. Follow up with rheum as scheduled.

## 2021-12-14 NOTE — Assessment & Plan Note (Signed)
Well-controlled with rare breakthrough of symptoms. Cough and hoarseness have improved. Continue on Zyrtec, Singulair, and flonase. Monitor closely with changing of seasons.

## 2021-12-14 NOTE — Progress Notes (Addendum)
@Patient  ID: [name], female    DOB: Apr 03, 1946, 76 y.o.   MRN: [id number]  Chief Complaint  Patient presents with   Follow-up    Sob-same, cough-dry, wheezing    Referring provider: Merrilee Seashore, MD  HPI: 76 year old female, former smoker (3 pack years) followed for asthma/COPD overlap syndrome, vocal cord dysfunction, allergic rhinitis, and GERD. She is a patient of Dr. Agustina Caroli and was last seen in office on 10/14/2021. Past medical history significant for migraines, TIA, RA on methotrexate, esophageal stricture, DM, osteoporosis, DDD, CKD stage II, essential thrombocythemia, sickle cell trait, MDD, anxiety, obesity, HLD.  TEST/EVENTS:  09/02/2012 echocardiogram: LVEF 55 to 60%.  Diastolic parameters normal.  Mild MVR. 09/01/2016 split-night study: AHI 0.3/h.  SPO2 low 92% 10/04/2021 HRCT chest: Atherosclerosis.  Mild to moderate centrilobular and paraseptal emphysema with diffuse bronchial wall thickening.  Few scattered tiny solid right pulmonary nodules, largest 0.3 cm in the apical right upper lobe, all stable and considered benign.  Mild patchy subpleural reticulation in the upper lobes bilaterally, unchanged minimal patchy peribronchovascular and subpleural groundglass opacity, unchanged no significant regions of architectural distortion, traction bronchiectasis or frank honeycombing. Not UIP 10/14/2021 PFTs: FVC 2.18 (112), FEV1 1.34 (89), ratio 61, TLC 5.24 (110), DLCO uncorrected 15.44 (86).  No BD  10/14/2021: OV with Dr. Lamonte Sakai. PFTs showed mild obstruction without BD and hyperinflated volumes. Suspect cough and hoarseness r/t severe upper airway irritation with vocal cord dysfunction. Aggressive tx of GERD and allergic rhinitis with PPI, zyrtec, singulair and flonase. Continue Breztri Twice daily and PRN albuterol for COPD/asthma. No evidence on recent HRCT for overt ILD; no UIP or NSIP pattern. Follow with CXR d/t RA diagnosis.   12/14/2021: Today - follow  up Patient presents today for two month follow up. She reports that her breathing is well controlled and she only has an occasional wheeze. Her dry cough has improved and is much less frequent than it used to be. Her hoarseness does come and go and she correlates this to her allergy symptoms. These have also improved on her current regimen. She did notice a flare in her symptoms about 3 weeks ago but these resolved without intervention. She denies orthopnea, PND, chest pain, or lower extremity swelling. She continues on [place] Twice daily and has not had to use her albuterol inhaler or PRN nebs recently. She continues on singulair, Zyrtec, flonase, and twice daily Protonix. She previously completed financial assistance for [place] and returned paperwork in November but has not heard anything from them. Overall, her breathing is stable and she feels well today.   Allergies  Allergen Reactions   Penicillin G Rash    Other reaction(s): rash Other reaction(s): rash Other reaction(s): rash Other reaction(s): rash   Penicillins Itching and Rash    Did it involve swelling of the face/tongue/throat, SOB, or low BP? N Did it involve sudden or severe rash/hives, skin peeling, or any reaction on the inside of your mouth or nose? Y Did you need to seek medical attention at a hospital or doctor's office? N When did it last happen? Decades Ago      If all above answers are "NO", may proceed with cephalosporin use.     Pravastatin Other (See Comments)    Other reaction(s): leg cramps Other reaction(s): leg cramps Other reaction(s): leg cramps Other reaction(s): leg cramps    Immunization History  Administered Date(s) Administered   DTaP 03/20/2009   Fluad Quad(high Dose 65+) 09/16/2019   Influenza  Split 11/10/2011, 10/10/2012, 09/17/2018   Influenza Whole 10/19/2006, 09/03/2008, 10/05/2009, 09/07/2010   Influenza, High Dose Seasonal PF 08/27/2014, 09/15/2016, 09/05/2017   Influenza, Quadrivalent,  Recombinant, Inj, Pf 08/21/2017, 09/17/2018, 09/16/2019, 08/27/2020, 09/23/2021   Influenza,inj,Quad PF,6+ Mos 09/11/2013, 08/08/2014, 09/14/2015, 09/05/2016   Influenza-Unspecified 10/05/2018   Moderna SARS-COV2 Booster Vaccination 09/29/2020   Moderna Sars-Covid-2 Vaccination 01/27/2020, 02/25/2020   Pneumococcal Conjugate-13 11/05/2014   Pneumococcal Polysaccharide-23 03/01/2006, 12/29/2016   Td 03/20/2009   Zoster, Live 10/19/2006    Past Medical History:  Diagnosis Date   Allergic rhinitis    Allergy    seasonal allergies   Anemia    hx of   Asthma    on meds   Cataract    bilateral sx    Chronic bronchitis (HCC)    Colonic polyp    DDD (degenerative disc disease), lumbar 07/20/2011   DJD (degenerative joint disease) of knee    bilateral   DJD (degenerative joint disease), lumbar 07/20/2011   Esophageal stricture    GERD (gastroesophageal reflux disease)    on meds   Hiatal hernia    Hyperlipidemia    on meds   Osteoporosis    Stroke [facility] 2017   "didn't know I'd had one before I was told; didn't do any damage" (11/24/2016)   Vocal cord dysfunction     Tobacco History: Social History   Tobacco Use  Smoking Status Former   Packs/day: 0.10   Years: 30.00   Pack years: 3.00   Types: Cigarettes   Start date: 01/31/1975   Quit date: 08/09/1995   Years since quitting: 26.3  Smokeless Tobacco Never   Counseling given: Not Answered   Outpatient Medications Prior to Visit  Medication Sig Dispense Refill   albuterol (PROVENTIL) (2.5 MG/3ML) 0.083% nebulizer solution Take 3 mLs (2.5 mg total) by nebulization every 4 (four) hours as needed for shortness of breath. 300 mL 3   aspirin EC 81 MG tablet Take 81 mg by mouth daily.     CALCIUM-CHOLECALCIFEROL PO Take 1 tablet by mouth daily at 6 (six) AM.     cholecalciferol (VITAMIN D) 1000 UNITS tablet Take 1,000 Units by mouth daily.     fluticasone (FLONASE) 50 MCG/ACT nasal spray Place 1 spray into the nose 2 (two)  times daily.     folic acid (FOLVITE) 1 MG tablet Take 1 tablet (1 mg total) by mouth daily. 90 tablet 3   gabapentin (NEURONTIN) 100 MG capsule Take 100 mg by mouth at bedtime.     hydroxyurea (HYDREA) 500 MG capsule Take 1 capsule (500 mg total) by mouth 3 (three) times daily before meals. 90 capsule 6   Magnesium 250 MG TABS Take 1 tablet by mouth daily.     montelukast (SINGULAIR) 10 MG tablet Take 1 tablet (10 mg total) by mouth at bedtime. 90 tablet 3   Multiple Vitamins-Minerals (CENTRUM SILVER 50+WOMEN PO) Take 1 tablet by mouth daily.     pantoprazole (PROTONIX) 40 MG tablet Take 1 tablet (40 mg total) by mouth 2 (two) times daily. Take 30- 60 min before your first and last meals of the day 180 tablet 3   rosuvastatin (CRESTOR) 10 MG tablet Take 10 mg by mouth at bedtime.     albuterol (PROVENTIL HFA) 108 (90 Base) MCG/ACT inhaler Inhale 2 puffs into the lungs every 4 (four) hours as needed for wheezing or shortness of breath. Reported on 12/30/2015 8 g 1   arformoterol (BROVANA) 15 MCG/2ML NEBU Take 2  mLs (15 mcg total) by nebulization 2 (two) times daily. 180 mL 1   Budeson-Glycopyrrol-Formoterol (BREZTRI AEROSPHERE) 160-9-4.8 MCG/ACT AERO Inhale 2 puffs into the lungs in the morning and at bedtime. 10.7 g 11   budesonide (PULMICORT) 0.25 MG/2ML nebulizer solution Take 2 mLs (0.25 mg total) by nebulization 2 (two) times daily. 180 mL 1   cetirizine (ZYRTEC ALLERGY) 10 MG tablet Take 1 tablet (10 mg total) by mouth daily. 30 tablet 5   revefenacin (YUPELRI) 175 MCG/3ML nebulizer solution Take 3 mLs (175 mcg total) by nebulization daily. 270 mL 1   Budeson-Glycopyrrol-Formoterol (BREZTRI AEROSPHERE) 160-9-4.8 MCG/ACT AERO Inhale 2 puffs into the lungs in the morning and at bedtime. 5.9 g 0   Facility-Administered Medications Prior to Visit  Medication Dose Route Frequency Provider Last Rate Last Admin   0.9 %  sodium chloride infusion  500 mL Intravenous Once Irene Shipper, MD          Review of Systems:   Constitutional: No weight loss or gain, night sweats, fevers, chills, fatigue, or lassitude. HEENT: No headaches, difficulty swallowing, tooth/dental problems, or sore throat. No sneezing, itching, ear ache, nasal congestion. +rare occasional allergy symptoms (improved); occasional hoarseness (improved) CV:  No chest pain, orthopnea, PND, swelling in lower extremities, anasarca, dizziness, palpitations, syncope Resp: +shortness of breath with strenuous exertion; rare wheeze; occasional dry cough (all with significant improvement). No excess mucus or change in color of mucus.  No hemoptysis.   No chest wall deformity GI:  No heartburn, indigestion, abdominal pain, nausea, vomiting, diarrhea, change in bowel habits, loss of appetite, bloody stools.  GU: No dysuria, change in color of urine, urgency or frequency.  No flank pain, no hematuria  Skin: No rash, lesions, ulcerations MSK:  No joint pain or swelling.  No decreased range of motion.  No back pain. Neuro: No dizziness or lightheadedness.  Psych: No depression or anxiety. Mood stable.     Physical Exam:  BP 100/68 (BP Location: Right Arm, Cuff Size: Normal)    Pulse 87    Temp 98.2 F (36.8 C) (Temporal)    Ht 5\' 2"  (1.575 m)    Wt 153 lb 3.2 oz (69.5 kg)    SpO2 100%    BMI 28.02 kg/m   GEN: Pleasant, interactive, well-nourished, well-appearing; in no acute distress. HEENT:  Normocephalic and atraumatic. EACs patent bilaterally. TM pearly gray with present light reflex bilaterally. PERRLA. Sclera white. Nasal turbinates pink, moist and patent bilaterally. No rhinorrhea present. Oropharynx pink and moist, without exudate or edema. No lesions, ulcerations, or postnasal drip.  NECK:  Supple w/ fair ROM. No JVD present. Normal carotid impulses w/o bruits. Thyroid symmetrical with no goiter or nodules palpated. No lymphadenopathy.   CV: RRR, no m/r/g, no peripheral edema. Pulses intact, +2 bilaterally. No cyanosis,  pallor or clubbing. PULMONARY:  Unlabored, regular breathing. Clear bilaterally A&P w/o wheezes/rales/rhonchi. No accessory muscle use. No dullness to percussion. GI: BS present and normoactive. Soft, non-tender to palpation. No organomegaly or masses detected. No CVA tenderness. MSK: No erythema, warmth or tenderness. Cap refil <2 sec all extrem. No deformities or joint swelling noted.  Neuro: A/Ox3. No focal deficits noted.   Skin: Warm, no lesions or rashe Psych: Normal affect and behavior. Judgement and thought content appropriate.     Lab Results:  CBC    Component Value Date/Time   WBC 7.0 10/05/2021 0737   WBC 8.2 09/04/2021 0528   RBC 3.89 10/05/2021 0737   HGB  11.4 (L) 10/05/2021 0737   HGB 12.4 11/24/2017 0754   HCT 33.2 (L) 10/05/2021 0737   HCT 37.8 11/24/2017 0754   PLT 390 10/05/2021 0737   PLT 786 (H) 11/24/2017 0754   MCV 85.3 10/05/2021 0737   MCV 75 (L) 11/24/2017 0754   MCH 29.3 10/05/2021 0737   MCHC 34.3 10/05/2021 0737   RDW 17.9 (H) 10/05/2021 0737   RDW 16.7 (H) 11/24/2017 0754   LYMPHSABS 1.9 10/05/2021 0737   LYMPHSABS 1.9 11/24/2017 0754   MONOABS 0.6 10/05/2021 0737   EOSABS 0.0 10/05/2021 0737   EOSABS 0.2 11/24/2017 0754   BASOSABS 0.0 10/05/2021 0737   BASOSABS 0.1 11/24/2017 0754    BMET    Component Value Date/Time   NA 141 10/05/2021 0737   NA 145 11/24/2017 0754   NA 142 05/25/2017 0755   K 3.8 10/05/2021 0737   K 3.7 11/24/2017 0754   K 3.9 05/25/2017 0755   CL 108 10/05/2021 0737   CL 104 11/24/2017 0754   CO2 26 10/05/2021 0737   CO2 27 11/24/2017 0754   CO2 24 05/25/2017 0755   GLUCOSE 93 10/05/2021 0737   GLUCOSE 123 (H) 11/24/2017 0754   BUN 8 10/05/2021 0737   BUN 12 11/24/2017 0754   BUN 13.3 05/25/2017 0755   CREATININE 0.78 10/05/2021 0737   CREATININE 1.0 11/24/2017 0754   CREATININE 0.8 05/25/2017 0755   CALCIUM 9.1 10/05/2021 0737   CALCIUM 9.1 11/24/2017 0754   CALCIUM 9.5 05/25/2017 0755   GFRNONAA >60  10/05/2021 0737   GFRAA >60 09/07/2020 0838    BNP    Component Value Date/Time   BNP 30.5 07/06/2020 1800     Imaging:  CT ABDOMEN PELVIS W CONTRAST  Result Date: 12/06/2021 CLINICAL DATA:  Weight loss EXAM: CT ABDOMEN AND PELVIS WITH CONTRAST TECHNIQUE: Multidetector CT imaging of the abdomen and pelvis was performed using the standard protocol following bolus administration of intravenous contrast. CONTRAST:  185mL ISOVUE-300 IOPAMIDOL (ISOVUE-300) INJECTION 61% COMPARISON:  05/20/2020 FINDINGS: Lower chest: There is moderate sized fixed hiatal hernia. There is contrast in the lumen of lower thoracic esophagus, possibly suggesting reflux. There is interval increase in size of hiatal hernia. Hepatobiliary: Liver measures 17.1 cm. No focal abnormality is seen. There is no dilation of bile ducts. Gallbladder is not distended. Pancreas: No focal abnormality is seen. Spleen: Unremarkable. Adrenals/Urinary Tract: Adrenals are unremarkable. There is no hydronephrosis. There are no renal or ureteral stones. Urinary bladder is not distended. Stomach/Bowel: Moderate-sized hiatal hernia is seen. There is 4.6 x 2.4 cm lobulated soft tissue density in the posterior margin of antrum of the stomach. Small bowel loops are not dilated. Appendix is not dilated. There is no significant wall thickening in the colon. There is no pericolic stranding or fluid collection. Vascular/Lymphatic: Scattered arterial calcifications are seen. There is ectasia of infrarenal abdominal aorta. No significant lymphadenopathy is seen. Reproductive: Uterus is not seen.  There are no adnexal masses. Other: There is no ascites or pneumoperitoneum. Small bilateral inguinal hernias containing fat are seen. Musculoskeletal: There is mild anterolisthesis at L4-L5 level. There is encroachment of neural foramina at multiple levels more so at L5-S1 level. IMPRESSION: There is 4.6 cm lobulated soft tissue density structure inseparable from the  posterior wall of the antrum of the stomach. Possibility of malignant neoplasm in the stomach is not excluded. Endoscopy as clinically warranted should be considered. There is no evidence of intestinal obstruction or pneumoperitoneum. There is no hydronephrosis.  Appendix is not dilated. There is moderate sized fixed hiatal hernia. Possible gastroesophageal reflux. Other findings as described in the body of the report. Electronically Signed   By: Elmer Picker M.D.   On: 12/06/2021 15:32      PFT Results Latest Ref Rng & Units 10/14/2021 04/24/2020 02/03/2017  FVC-Pre L 2.12 1.89 1.96  FVC-Predicted Pre % 108 88 89  FVC-Post L 2.18 1.76 2.44  FVC-Predicted Post % 112 82 110  Pre FEV1/FVC % % 62 50 56  Post FEV1/FCV % % 61 51 61  FEV1-Pre L 1.31 0.94 1.11  FEV1-Predicted Pre % 87 57 65  FEV1-Post L 1.34 0.90 1.48  DLCO uncorrected ml/min/mmHg 15.44 15.44 20.71  DLCO UNC% % 86 83 90  DLCO corrected ml/min/mmHg 15.44 15.44 19.42  DLCO COR %Predicted % 86 83 84  DLVA Predicted % 109 139 114  TLC L 5.24 4.67 6.30  TLC % Predicted % 110 95 128  RV % Predicted % 164 124 199    No results found for: NITRICOXIDE      Assessment & Plan:   Asthma-COPD overlap syndrome (HCC) Breathing stable. Well-controlled on Breztri Twice daily, Singulair and PRN albuterol. Not requiring frequent use of PRN. Discussed avoidance of triggers. Provided with contact information for Breztri to follow up on financial assistance application. Provided with sample today.   Patient Instructions  -Continue Breztri 2 puffs Twice daily. Brush tongue and rinse mouth afterwards -Continue Albuterol inhaler 2 puffs or 3 mL neb every 6 hours as needed for shortness of breath or wheezing. Notify if symptoms persist despite rescue inhaler/neb use. -Continue Protonix 40 mg Twice daily  -Continue Zyrtec 10 mg daily. Can use generic version ceterizine  -Continue flonase nasal spray 1 spray each nostril daily -Continue  Singulair 10 mg At bedtime   Asthma Action Plan in place Rinse mouth after inhaled corticosteroid use.  Avoid triggers, when able.  Exercise encouraged. Notify if worsening symptoms upon exertion.  Notify and seek help if symptoms unrelieved by rescue inhaler.  Follow up in 3 months with Dr. Lamonte Sakai or Alanson Aly. If symptoms do not improve or worsen, please contact office for sooner follow up or seek emergency care.    Allergic rhinitis Well-controlled with rare breakthrough of symptoms. Cough and hoarseness have improved. Continue on Zyrtec, Singulair, and flonase. Monitor closely with changing of seasons.  Vocal cord dysfunction Improved with aggressive tx of GERD and allergies. Continue current regimen.   Seropositive rheumatoid arthritis (HCC) On methotrexate. No evidence of overt ILD on recent HRCT. Continue to monitor with CXR for surveillance, per Dr Lamonte Sakai. Follow up with rheum as scheduled.    Clayton Bibles, NP 12/14/2021  Pt aware and understands NP's role.

## 2021-12-14 NOTE — Assessment & Plan Note (Addendum)
Breathing stable. Well-controlled on Breztri Twice daily, Singulair and PRN albuterol. Not requiring frequent use of PRN. Discussed avoidance of triggers. Provided with contact information for Breztri to follow up on financial assistance application. Provided with sample today.   Patient Instructions  -Continue Breztri 2 puffs Twice daily. Brush tongue and rinse mouth afterwards -Continue Albuterol inhaler 2 puffs or 3 mL neb every 6 hours as needed for shortness of breath or wheezing. Notify if symptoms persist despite rescue inhaler/neb use. -Continue Protonix 40 mg Twice daily  -Continue Zyrtec 10 mg daily. Can use generic version ceterizine  -Continue flonase nasal spray 1 spray each nostril daily -Continue Singulair 10 mg At bedtime   Asthma Action Plan in place Rinse mouth after inhaled corticosteroid use.  Avoid triggers, when able.  Exercise encouraged. Notify if worsening symptoms upon exertion.  Notify and seek help if symptoms unrelieved by rescue inhaler.  Follow up in 3 months with Dr. Lamonte Sakai or Alanson Aly. If symptoms do not improve or worsen, please contact office for sooner follow up or seek emergency care.

## 2021-12-14 NOTE — Assessment & Plan Note (Signed)
Improved with aggressive tx of GERD and allergies. Continue current regimen.

## 2021-12-14 NOTE — Patient Instructions (Addendum)
-  Continue Breztri 2 puffs Twice daily. Brush tongue and rinse mouth afterwards -Continue Albuterol inhaler 2 puffs or 3 mL neb every 6 hours as needed for shortness of breath or wheezing. Notify if symptoms persist despite rescue inhaler/neb use. -Continue Protonix 40 mg Twice daily  -Continue Zyrtec 10 mg daily. Can use generic version ceterizine  -Continue flonase nasal spray 1 spray each nostril daily -Continue Singulair 10 mg At bedtime   Asthma Action Plan in place Rinse mouth after inhaled corticosteroid use.  Avoid triggers, when able.  Exercise encouraged. Notify if worsening symptoms upon exertion.  Notify and seek help if symptoms unrelieved by rescue inhaler.  Follow up in 3 months with Dr. Lamonte Sakai or Alanson Aly. If symptoms do not improve or worsen, please contact office for sooner follow up or seek emergency care.

## 2021-12-15 ENCOUNTER — Ambulatory Visit: Payer: Self-pay | Admitting: *Deleted

## 2021-12-15 DIAGNOSIS — R202 Paresthesia of skin: Secondary | ICD-10-CM | POA: Diagnosis not present

## 2021-12-15 DIAGNOSIS — M1990 Unspecified osteoarthritis, unspecified site: Secondary | ICD-10-CM | POA: Diagnosis not present

## 2021-12-15 DIAGNOSIS — M0579 Rheumatoid arthritis with rheumatoid factor of multiple sites without organ or systems involvement: Secondary | ICD-10-CM | POA: Diagnosis not present

## 2021-12-15 DIAGNOSIS — G5600 Carpal tunnel syndrome, unspecified upper limb: Secondary | ICD-10-CM | POA: Diagnosis not present

## 2021-12-15 DIAGNOSIS — M79641 Pain in right hand: Secondary | ICD-10-CM | POA: Diagnosis not present

## 2021-12-15 DIAGNOSIS — Z79899 Other long term (current) drug therapy: Secondary | ICD-10-CM | POA: Diagnosis not present

## 2021-12-15 DIAGNOSIS — M79642 Pain in left hand: Secondary | ICD-10-CM | POA: Diagnosis not present

## 2021-12-15 DIAGNOSIS — M549 Dorsalgia, unspecified: Secondary | ICD-10-CM | POA: Diagnosis not present

## 2021-12-15 DIAGNOSIS — R634 Abnormal weight loss: Secondary | ICD-10-CM | POA: Diagnosis not present

## 2021-12-29 ENCOUNTER — Other Ambulatory Visit: Payer: Self-pay | Admitting: *Deleted

## 2021-12-29 NOTE — Patient Outreach (Signed)
[facility]) Care Management  12/29/2021  [name] 04-Jul-1946 [id number]   [facility] Unsuccessful outreach   Outreach attempt to the listed at the preferred outreach number in EPIC  No answer. [facility] RN CM left HIPAA [facility] Portability and Accountability Act) compliant voicemail message along with CMs contact info.   Plan: [facility] RN CM scheduled this patient for another call attempt within 30+ business days Unsuccessful outreach on 12/29/21   Tere Mcconaughey L. [name], RN, BSN, [place] Coordinator Office number [phone] Mobile number [phone]  Main [facility] number [phone] Fax number [phone]

## 2022-01-05 ENCOUNTER — Other Ambulatory Visit: Payer: Self-pay | Admitting: *Deleted

## 2022-01-05 ENCOUNTER — Other Ambulatory Visit: Payer: Self-pay

## 2022-01-05 ENCOUNTER — Inpatient Hospital Stay: Payer: Medicare Other | Attending: Hematology & Oncology

## 2022-01-05 ENCOUNTER — Inpatient Hospital Stay (HOSPITAL_BASED_OUTPATIENT_CLINIC_OR_DEPARTMENT_OTHER): Payer: Medicare Other | Admitting: Hematology & Oncology

## 2022-01-05 VITALS — BP 113/53 | HR 82 | Temp 98.2°F | Resp 16 | Wt 153.0 lb

## 2022-01-05 DIAGNOSIS — K449 Diaphragmatic hernia without obstruction or gangrene: Secondary | ICD-10-CM | POA: Diagnosis not present

## 2022-01-05 DIAGNOSIS — D573 Sickle-cell trait: Secondary | ICD-10-CM | POA: Diagnosis not present

## 2022-01-05 DIAGNOSIS — D473 Essential (hemorrhagic) thrombocythemia: Secondary | ICD-10-CM | POA: Diagnosis not present

## 2022-01-05 DIAGNOSIS — Z7982 Long term (current) use of aspirin: Secondary | ICD-10-CM | POA: Insufficient documentation

## 2022-01-05 DIAGNOSIS — J45909 Unspecified asthma, uncomplicated: Secondary | ICD-10-CM | POA: Insufficient documentation

## 2022-01-05 LAB — CBC WITH DIFFERENTIAL (CANCER CENTER ONLY)
Abs Immature Granulocytes: 0.05 10*3/uL (ref 0.00–0.07)
Basophils Absolute: 0 10*3/uL (ref 0.0–0.1)
Basophils Relative: 0 %
Eosinophils Absolute: 0 10*3/uL (ref 0.0–0.5)
Eosinophils Relative: 0 %
HCT: 36.7 % (ref 36.0–46.0)
Hemoglobin: 12.3 g/dL (ref 12.0–15.0)
Immature Granulocytes: 1 %
Lymphocytes Relative: 15 %
Lymphs Abs: 1.2 10*3/uL (ref 0.7–4.0)
MCH: 28.4 pg (ref 26.0–34.0)
MCHC: 33.5 g/dL (ref 30.0–36.0)
MCV: 84.8 fL (ref 80.0–100.0)
Monocytes Absolute: 0.4 10*3/uL (ref 0.1–1.0)
Monocytes Relative: 5 %
Neutro Abs: 6.2 10*3/uL (ref 1.7–7.7)
Neutrophils Relative %: 79 %
Platelet Count: 454 10*3/uL — ABNORMAL HIGH (ref 150–400)
RBC: 4.33 MIL/uL (ref 3.87–5.11)
RDW: 16.1 % — ABNORMAL HIGH (ref 11.5–15.5)
WBC Count: 7.8 10*3/uL (ref 4.0–10.5)
nRBC: 0 % (ref 0.0–0.2)

## 2022-01-05 LAB — CMP (CANCER CENTER ONLY)
ALT: 12 U/L (ref 0–44)
AST: 14 U/L — ABNORMAL LOW (ref 15–41)
Albumin: 4 g/dL (ref 3.5–5.0)
Alkaline Phosphatase: 71 U/L (ref 38–126)
Anion gap: 9 (ref 5–15)
BUN: 9 mg/dL (ref 8–23)
CO2: 26 mmol/L (ref 22–32)
Calcium: 9.7 mg/dL (ref 8.9–10.3)
Chloride: 105 mmol/L (ref 98–111)
Creatinine: 0.83 mg/dL (ref 0.44–1.00)
GFR, Estimated: >60 mL/min (ref >60)
Glucose, Bld: 133 mg/dL — ABNORMAL HIGH (ref 70–99)
Potassium: 4.2 mmol/L (ref 3.5–5.1)
Sodium: 140 mmol/L (ref 135–145)
Total Bilirubin: 0.4 mg/dL (ref 0.3–1.2)
Total Protein: 6.2 g/dL — ABNORMAL LOW (ref 6.5–8.1)

## 2022-01-05 LAB — FERRITIN: Ferritin: 133 ng/mL (ref 11–307)

## 2022-01-05 LAB — SAVE SMEAR(SSMR), FOR PROVIDER SLIDE REVIEW

## 2022-01-05 LAB — IRON AND IRON BINDING CAPACITY (CC-WL,HP ONLY)
Iron: 58 ug/dL (ref 28–170)
Saturation Ratios: 22 % (ref 10.4–31.8)
TIBC: 265 ug/dL (ref 250–450)
UIBC: 207 ug/dL (ref 148–442)

## 2022-01-05 LAB — LACTATE DEHYDROGENASE: LDH: 180 U/L (ref 98–192)

## 2022-01-05 NOTE — Patient Outreach (Signed)
[facility]) Care Management  01/05/2022  KAMIYA ACORD 22-Mar-1946 [id number]   [facility] Unsuccessful outreach#2   Outreach attempt to the listed at the preferred outreach number in EPIC [phone] No answer. [facility] RN CM left HIPAA [facility] Portability and Accountability Act) compliant voicemail message along with CMs contact info.   Plan: [facility] RN CM scheduled this patient for another call attempt within 30+ business days Unsuccessful outreach letter sent on 01/05/22 Unsuccessful outreach on 12/29/21, 01/05/22   Joelene Millin L. [name], RN, BSN, [place] Coordinator Office number [phone] Mobile number [phone]  Main [facility] number [phone] Fax number [phone]

## 2022-01-05 NOTE — Progress Notes (Signed)
Hematology and Oncology Follow Up Visit  [name] [id number] 06/19/1946 76 y.o. 01/05/2022   Principle Diagnosis:  Essential thrombocythemia - JAK2 POSITIVE Sickle cell trait  Current Therapy:   Hydrea 500 mg PO TID - dose changed on 23/55/7322 Folic acid 1 mg by mouth daily Aspirin 81 mg daily    Interim History:  [name] is here today for follow-up.  The problem that she has had is that she has been losing some weight.  Her weight is down to 153 pounds.  She did have a CT scan done on 12/03/2021.  This showed a moderate sized hiatal hernia.  However, there is a 4.6 x 2.4 cm soft tissue density in the posterior margin of the antrum of the stomach.  There is no lymphadenopathy.  Her liver was lobe on the big side.  There is no ascites.  She has appointment see Dr. Henrene Pastor of Gastroenterology in a couple weeks.  I suspect that a upper endoscopy will need to be done.  She otherwise is doing okay with the Hydrea.  She has had no nausea or vomiting with the Hydrea.  She has had no issues with the sickle cell trait.  Her asthma is little bit bad today.  She is wheezing quite a bit.  She has had no bleeding.  There is been no diarrhea.  She has had no leg swelling.  She has had no rashes.  Overall, I would say her performance status is probably ECOG 1.      Medications:  Allergies as of 01/05/2022       Reactions   Statins Other (See Comments)   Penicillin G Rash, Other (See Comments)   Other reaction(s): rash Other reaction(s): rash Other reaction(s): rash Other reaction(s): rash   Penicillins Itching, Rash   Did it involve swelling of the face/tongue/throat, SOB, or low BP? N Did it involve sudden or severe rash/hives, skin peeling, or any reaction on the inside of your mouth or nose? Y Did you need to seek medical attention at a hospital or doctor's office? N When did it last happen? Decades Ago      If all above answers are "NO", may proceed with cephalosporin use.    Pravastatin Other (See Comments)   Other reaction(s): leg cramps Other reaction(s): leg cramps Other reaction(s): leg cramps Other reaction(s): leg cramps        Medication List        Accurate as of January 05, 2022  8:11 AM. If you have any questions, ask your nurse or doctor.          albuterol (2.5 MG/3ML) 0.083% nebulizer solution Commonly known as: PROVENTIL Take 3 mLs (2.5 mg total) by nebulization every 4 (four) hours as needed for shortness of breath.   albuterol 108 (90 Base) MCG/ACT inhaler Commonly known as: VENTOLIN HFA Inhale 2 puffs into the lungs every 6 (six) hours as needed for wheezing or shortness of breath.   aspirin EC 81 MG tablet Take 81 mg by mouth daily.   Breztri Aerosphere 160-9-4.8 MCG/ACT Aero Generic drug: Budeson-Glycopyrrol-Formoterol Inhale 2 puffs into the lungs in the morning and at bedtime.   Breztri Aerosphere 160-9-4.8 MCG/ACT Aero Generic drug: Budeson-Glycopyrrol-Formoterol Inhale 2 puffs into the lungs in the morning and at bedtime.   CALCIUM-CHOLECALCIFEROL PO Take 1 tablet by mouth daily at 6 (six) AM.   CENTRUM SILVER 50+WOMEN PO Take 1 tablet by mouth daily.   cetirizine 10 MG tablet Commonly known as:  ZyrTEC Allergy Take 1 tablet (10 mg total) by mouth daily.   cholecalciferol 1000 units tablet Commonly known as: VITAMIN D Take 1,000 Units by mouth daily.   fluticasone 50 MCG/ACT nasal spray Commonly known as: FLONASE Place 1 spray into the nose 2 (two) times daily.   folic acid 1 MG tablet Commonly known as: FOLVITE Take 1 tablet (1 mg total) by mouth daily.   gabapentin 100 MG capsule Commonly known as: NEURONTIN Take 100 mg by mouth at bedtime.   hydroxyurea 500 MG capsule Commonly known as: HYDREA Take 1 capsule (500 mg total) by mouth 3 (three) times daily before meals.   Magnesium 250 MG Tabs Take 1 tablet by mouth daily.   montelukast 10 MG tablet Commonly known as: SINGULAIR Take 1 tablet  (10 mg total) by mouth at bedtime.   pantoprazole 40 MG tablet Commonly known as: PROTONIX Take 1 tablet (40 mg total) by mouth 2 (two) times daily. Take 30- 60 min before your first and last meals of the day   rosuvastatin 10 MG tablet Commonly known as: CRESTOR Take 10 mg by mouth at bedtime.        Allergies:  Allergies  Allergen Reactions   Statins Other (See Comments)   Penicillin G Rash and Other (See Comments)    Other reaction(s): rash Other reaction(s): rash Other reaction(s): rash Other reaction(s): rash   Penicillins Itching and Rash    Did it involve swelling of the face/tongue/throat, SOB, or low BP? N Did it involve sudden or severe rash/hives, skin peeling, or any reaction on the inside of your mouth or nose? Y Did you need to seek medical attention at a hospital or doctor's office? N When did it last happen? Decades Ago      If all above answers are "NO", may proceed with cephalosporin use.     Pravastatin Other (See Comments)    Other reaction(s): leg cramps Other reaction(s): leg cramps Other reaction(s): leg cramps Other reaction(s): leg cramps    Past Medical History, Surgical history, Social history, and Family History were reviewed and updated.  Review of Systems:   Review of Systems  Constitutional: Negative.   HENT: Negative.    Eyes: Negative.   Respiratory: Negative.    Cardiovascular: Negative.   Gastrointestinal: Negative.   Genitourinary: Negative.   Musculoskeletal: Negative.   Skin: Negative.   Neurological: Negative.   Endo/Heme/Allergies: Negative.   Psychiatric/Behavioral: Negative.      Physical Exam:  weight is 153 lb (69.4 kg). Her oral temperature is 98.2 F (36.8 C). Her blood pressure is 113/53 (abnormal) and her pulse is 82. Her respiration is 16 and oxygen saturation is 100%.   Wt Readings from Last 3 Encounters:  01/05/22 153 lb (69.4 kg)  12/14/21 153 lb 3.2 oz (69.5 kg)  10/14/21 158 lb (71.7 kg)   Physical  Activity: Inactive   Days of Exercise per Week: 0 days   Minutes of Exercise per Session: 0 min     Physical Exam Vitals reviewed.  HENT:     Head: Normocephalic and atraumatic.  Eyes:     Pupils: Pupils are equal, round, and reactive to light.  Cardiovascular:     Rate and Rhythm: Normal rate and regular rhythm.     Heart sounds: Normal heart sounds.  Pulmonary:     Effort: Pulmonary effort is normal.     Breath sounds: Normal breath sounds.  Abdominal:     General: Bowel sounds are normal.  Palpations: Abdomen is soft.  Musculoskeletal:        General: No tenderness or deformity. Normal range of motion.     Cervical back: Normal range of motion.  Lymphadenopathy:     Cervical: No cervical adenopathy.  Skin:    General: Skin is warm and dry.     Findings: No erythema or rash.  Neurological:     Mental Status: She is alert and oriented to person, place, and time.  Psychiatric:        Behavior: Behavior normal.        Thought Content: Thought content normal.        Judgment: Judgment normal.     Lab Results  Component Value Date   WBC 7.8 01/05/2022   HGB 12.3 01/05/2022   HCT 36.7 01/05/2022   MCV 84.8 01/05/2022   PLT 454 (H) 01/05/2022   Lab Results  Component Value Date   FERRITIN 168 05/19/2020   IRON 69 05/19/2020   TIBC 215 (L) 05/19/2020   UIBC 146 05/19/2020   IRONPCTSAT 32 05/19/2020   Lab Results  Component Value Date   RETICCTPCT 0.8 06/20/2019   RBC 4.33 01/05/2022   RETICCTABS 73.1 08/06/2015   No results found for: [employer] Lab Results  Component Value Date   IGGSERUM 685 09/07/2020   IGA 293 09/07/2020   IGMSERUM 26 09/07/2020   No results found for: Odetta Pink, SPEI   Chemistry      Component Value Date/Time   NA 141 10/05/2021 0737   NA 145 11/24/2017 0754   NA 142 05/25/2017 0755   K 3.8 10/05/2021 0737   K 3.7 11/24/2017 0754   K 3.9  05/25/2017 0755   CL 108 10/05/2021 0737   CL 104 11/24/2017 0754   CO2 26 10/05/2021 0737   CO2 27 11/24/2017 0754   CO2 24 05/25/2017 0755   BUN 8 10/05/2021 0737   BUN 12 11/24/2017 0754   BUN 13.3 05/25/2017 0755   CREATININE 0.78 10/05/2021 0737   CREATININE 1.0 11/24/2017 0754   CREATININE 0.8 05/25/2017 0755      Component Value Date/Time   CALCIUM 9.1 10/05/2021 0737   CALCIUM 9.1 11/24/2017 0754   CALCIUM 9.5 05/25/2017 0755   ALKPHOS 62 10/05/2021 0737   ALKPHOS 70 11/24/2017 0754   ALKPHOS 74 05/25/2017 0755   AST 14 (L) 10/05/2021 0737   AST 21 05/25/2017 0755   ALT 13 10/05/2021 0737   ALT 18 11/24/2017 0754   ALT 14 05/25/2017 0755   BILITOT 0.5 10/05/2021 0737   BILITOT 0.37 05/25/2017 0755       Impression and Plan: [name] is a very pleasant 76 yo African American female with essential thrombocythemia, that is JAK2 positive.  In addition, she has the sickle cell trait.  I think this right now is his weight loss.  Again, I am not sure as to what is going on.  The CT scan certainly shows something with the stomach.  Again she will have to have an upper endoscopy and biopsy.  Hopefully, this is not malignant.  We will not change the dose of Hydrea.  The platelet count is up a little bit more but I am not too worried about this for right now.  We will going to have to get her back probably in 6 weeks.  We will get had follow-up with respect to what Dr. Henrene Pastor finds when he does the  upper endoscopy.  I am sure that he will let us know what is going on.  He is always very thorough.  [name], MD 2/1/20238:11 AM

## 2022-01-12 ENCOUNTER — Other Ambulatory Visit: Payer: Self-pay | Admitting: *Deleted

## 2022-01-12 DIAGNOSIS — R209 Unspecified disturbances of skin sensation: Secondary | ICD-10-CM | POA: Insufficient documentation

## 2022-01-12 DIAGNOSIS — R269 Unspecified abnormalities of gait and mobility: Secondary | ICD-10-CM | POA: Insufficient documentation

## 2022-01-12 DIAGNOSIS — G250 Essential tremor: Secondary | ICD-10-CM | POA: Insufficient documentation

## 2022-01-12 DIAGNOSIS — G5600 Carpal tunnel syndrome, unspecified upper limb: Secondary | ICD-10-CM | POA: Insufficient documentation

## 2022-01-12 NOTE — Patient Outreach (Signed)
[facility]) Care Management  01/12/2022  LOANY NEUROTH [birth year] [id number]   [facility] Unsuccessful outreach#3     Outreach attempt to the listed at the preferred outreach number in EPIC [phone] No answer. [facility] RN CM left HIPAA [facility] Portability and Accountability Act) compliant voicemail message along with CMs contact info.    Plan: [facility] RN CM scheduled this patient for case closure pending an outreach from pt Unsuccessful outreach letter sent on 01/05/22 Unsuccessful outreach on 12/29/21, 01/05/22, 01/12/22     Iona Stay L. [name], RN, BSN, [place] Coordinator Office number [phone] Mobile number [phone]  Main [facility] number [phone] Fax number [phone]

## 2022-01-19 ENCOUNTER — Encounter: Payer: Self-pay | Admitting: Internal Medicine

## 2022-01-19 ENCOUNTER — Ambulatory Visit (INDEPENDENT_AMBULATORY_CARE_PROVIDER_SITE_OTHER): Payer: Medicare Other | Admitting: Internal Medicine

## 2022-01-19 VITALS — BP 114/60 | HR 100 | Ht 61.5 in | Wt 162.1 lb

## 2022-01-19 DIAGNOSIS — Z860100 Personal history of colon polyps, unspecified: Secondary | ICD-10-CM

## 2022-01-19 DIAGNOSIS — K222 Esophageal obstruction: Secondary | ICD-10-CM | POA: Diagnosis not present

## 2022-01-19 DIAGNOSIS — R634 Abnormal weight loss: Secondary | ICD-10-CM | POA: Diagnosis not present

## 2022-01-19 DIAGNOSIS — K219 Gastro-esophageal reflux disease without esophagitis: Secondary | ICD-10-CM | POA: Diagnosis not present

## 2022-01-19 DIAGNOSIS — R935 Abnormal findings on diagnostic imaging of other abdominal regions, including retroperitoneum: Secondary | ICD-10-CM

## 2022-01-19 NOTE — H&P (View-Only) (Signed)
HISTORY OF PRESENT ILLNESS:  [name] is a 76 y.o. female with chronic GERD and asthma.  She is referred by her pulmonologist regarding weight loss and abnormal CT scan.  She also has a history of essential thrombocythemia and sickle cell trait.  The patient reports to me that she has had somewhat decreased appetite over the past 3 months.  She reports 20 pound weight loss.  She was last seen in this office October 2019 for GERD with a history of peptic stricture.  She did undergo colonoscopy June 2022.  The examination was complete to the cecum but compromised by poor preparation for which follow-up in 1 year with more extensive preparation recommended.  Her last upper endoscopy was performed October 2019.  She was found to have esophageal stricture which was dilated.  Hiatal hernia.  Otherwise normal.  Review of blood work from January 05, 2022 shows unremarkable comprehensive metabolic panel.  CBC with hemoglobin 12.3.  She tells me that she has had some issues with her asthma in recent months.  Doing better at this time though she tells me that she has chronic wheezing.  CT scan from December 03, 2021 revealed a 4.6 cm lobulated soft tissue density structure inseparable from the posterior wall of the antrum of the stomach.  A question of neoplasm has been raised.  Endoscopy recommended.  REVIEW OF SYSTEMS:  All non-GI ROS negative unless otherwise stated in the HPI except for arthritis  Past Medical History:  Diagnosis Date   Allergic rhinitis    Allergy    seasonal allergies   Anemia    hx of   Asthma    on meds   Cataract    bilateral sx    Chronic bronchitis (HCC)    Colonic polyp    DDD (degenerative disc disease), lumbar 07/20/2011   DJD (degenerative joint disease) of knee    bilateral   DJD (degenerative joint disease), lumbar 07/20/2011   Esophageal stricture    GERD (gastroesophageal reflux disease)    on meds   Hiatal hernia    Hyperlipidemia    on meds    Osteoporosis    Stroke [facility]) 2017   "didn't know I'd had one before I was told; didn't do any damage" (11/24/2016)   Vocal cord dysfunction     Past Surgical History:  Procedure Laterality Date   CARPAL TUNNEL RELEASE Bilateral    COLONOSCOPY  2017   JP-suprep(exc)=TA x 2   COLONOSCOPY W/ BIOPSIES AND POLYPECTOMY     ESOPHAGOGASTRODUODENOSCOPY (EGD) WITH ESOPHAGEAL DILATION     FOOT SURGERY Bilateral    "might have been bunions or hammertoes"   [place] ARTHROSCOPY Left 11/06/2013   POLYPECTOMY  2017   TA x 2   TONSILLECTOMY     TOTAL ABDOMINAL HYSTERECTOMY     VIDEO BRONCHOSCOPY Bilateral 12/12/2014   Procedure: VIDEO BRONCHOSCOPY WITHOUT FLUORO;  Surgeon: Chesley Mires, MD;  Location: [facility];  Service: Cardiopulmonary;  Laterality: Bilateral;   WISDOM TOOTH EXTRACTION      Social History [name]  reports that she quit smoking about 26 years ago. Her smoking use included cigarettes. She started smoking about 47 years ago. She has a 3.00 pack-year smoking history. She has never used smokeless tobacco. She reports current alcohol use. She reports that she does not use drugs.  family history includes Asthma in her brother and another family member; Colon cancer (age of onset: 70) in her sister;  Colon cancer (age of onset: 70) in her father; Colon polyps (age of onset: 56) in her sister; Colon polyps (age of onset: 29) in her daughter; Colon polyps (age of onset: 35) in her father; Heart attack in her mother.  Allergies  Allergen Reactions   Statins Other (See Comments)   Penicillin G Rash and Other (See Comments)    Other reaction(s): rash Other reaction(s): rash Other reaction(s): rash Other reaction(s): rash   Penicillins Itching and Rash    Did it involve swelling of the face/tongue/throat, SOB, or low BP? N Did it involve sudden or severe rash/hives, skin peeling, or any reaction on the inside of your mouth or nose? Y Did you need to seek  medical attention at a hospital or doctor's office? N When did it last happen? Decades Ago      If all above answers are "NO", may proceed with cephalosporin use.     Pravastatin Other (See Comments)    Other reaction(s): leg cramps Other reaction(s): leg cramps Other reaction(s): leg cramps Other reaction(s): leg cramps       PHYSICAL EXAMINATION: Vital signs: BP 114/60 (BP Location: Left Arm, Patient Position: Sitting, Cuff Size: Normal)    Pulse 100    Ht 5' 1.5" (1.562 m) Comment: height measured without shoes   Wt 162 lb 2 oz (73.5 kg)    BMI 30.14 kg/m   Constitutional: generally well-appearing, no acute distress Psychiatric: alert and oriented x3, cooperative Eyes: extraocular movements intact, anicteric, conjunctiva pink Mouth: oral pharynx moist, no lesions Neck: supple no lymphadenopathy Cardiovascular: heart regular rate and rhythm, no murmur Lungs: End expiratory wheezing but moving air easily Abdomen: soft, nontender, nondistended, no obvious ascites, no peritoneal signs, normal bowel sounds, no organomegaly Rectal: Omitted Extremities: no clubbing, cyanosis, or lower extremity edema bilaterally Skin: no lesions on visible extremities Neuro: No focal deficits.  Cranial nerves intact  ASSESSMENT:  1.  Abnormal CT scan questioning gastric mass.  Rule out neoplasm 2.  Decreased appetite and weight loss.  Rule out neoplasm 3.  GERD with history of peptic stricture.  Currently asymptomatic post dilation on PPI.  Last EGD with dilation 2019 4.  History of multiple adenomatous colon polyps multiple prior colonoscopies.  Last examination June 2022 with inadequate bowel preparation   PLAN:  1.  Upper endoscopy.The nature of the procedure, as well as the risks, benefits, and alternatives were carefully and thoroughly reviewed with the patient. Ample time for discussion and questions allowed. The patient understood, was satisfied, and agreed to proceed.  Patient is set up  for tomorrow morning. 2.  Reflux precautions 3.  Continue PPI 4.  Timing of colonoscopy to be determined after evaluation of possible gastric mass is completed.  She will need 2-day prep. 5.  Asthma.  Mild wheezing today but moving air well.  She will bring her inhalers with her to her endoscopic appointment.

## 2022-01-19 NOTE — Progress Notes (Signed)
HISTORY OF PRESENT ILLNESS:  [name] is a 76 y.o. female with chronic GERD and asthma.  She is referred by her pulmonologist regarding weight loss and abnormal CT scan.  She also has a history of essential thrombocythemia and sickle cell trait.  The patient reports to me that she has had somewhat decreased appetite over the past 3 months.  She reports 20 pound weight loss.  She was last seen in this office October 2019 for GERD with a history of peptic stricture.  She did undergo colonoscopy June 2022.  The examination was complete to the cecum but compromised by poor preparation for which follow-up in 1 year with more extensive preparation recommended.  Her last upper endoscopy was performed October 2019.  She was found to have esophageal stricture which was dilated.  Hiatal hernia.  Otherwise normal.  Review of blood work from January 05, 2022 shows unremarkable comprehensive metabolic panel.  CBC with hemoglobin 12.3.  She tells me that she has had some issues with her asthma in recent months.  Doing better at this time though she tells me that she has chronic wheezing.  CT scan from December 03, 2021 revealed a 4.6 cm lobulated soft tissue density structure inseparable from the posterior wall of the antrum of the stomach.  A question of neoplasm has been raised.  Endoscopy recommended.  REVIEW OF SYSTEMS:  All non-GI ROS negative unless otherwise stated in the HPI except for arthritis  Past Medical History:  Diagnosis Date   Allergic rhinitis    Allergy    seasonal allergies   Anemia    hx of   Asthma    on meds   Cataract    bilateral sx    Chronic bronchitis (HCC)    Colonic polyp    DDD (degenerative disc disease), lumbar 07/20/2011   DJD (degenerative joint disease) of knee    bilateral   DJD (degenerative joint disease), lumbar 07/20/2011   Esophageal stricture    GERD (gastroesophageal reflux disease)    on meds   Hiatal hernia    Hyperlipidemia    on meds    Osteoporosis    Stroke [facility]) 2017   "didn't know I'd had one before I was told; didn't do any damage" (11/24/2016)   Vocal cord dysfunction     Past Surgical History:  Procedure Laterality Date   CARPAL TUNNEL RELEASE Bilateral    COLONOSCOPY  2017   JP-suprep(exc)=TA x 2   COLONOSCOPY W/ BIOPSIES AND POLYPECTOMY     ESOPHAGOGASTRODUODENOSCOPY (EGD) WITH ESOPHAGEAL DILATION     FOOT SURGERY Bilateral    "might have been bunions or hammertoes"   [place] ARTHROSCOPY Left 11/06/2013   POLYPECTOMY  2017   TA x 2   TONSILLECTOMY     TOTAL ABDOMINAL HYSTERECTOMY     VIDEO BRONCHOSCOPY Bilateral 12/12/2014   Procedure: VIDEO BRONCHOSCOPY WITHOUT FLUORO;  Surgeon: Chesley Mires, MD;  Location: [facility];  Service: Cardiopulmonary;  Laterality: Bilateral;   WISDOM TOOTH EXTRACTION      Social History [name]  reports that she quit smoking about 26 years ago. Her smoking use included cigarettes. She started smoking about 47 years ago. She has a 3.00 pack-year smoking history. She has never used smokeless tobacco. She reports current alcohol use. She reports that she does not use drugs.  family history includes Asthma in her brother and another family member; Colon cancer (age of onset: 51) in her sister;  Colon cancer (age of onset: 2) in her father; Colon polyps (age of onset: 23) in her sister; Colon polyps (age of onset: 51) in her daughter; Colon polyps (age of onset: 54) in her father; Heart attack in her mother.  Allergies  Allergen Reactions   Statins Other (See Comments)   Penicillin G Rash and Other (See Comments)    Other reaction(s): rash Other reaction(s): rash Other reaction(s): rash Other reaction(s): rash   Penicillins Itching and Rash    Did it involve swelling of the face/tongue/throat, SOB, or low BP? N Did it involve sudden or severe rash/hives, skin peeling, or any reaction on the inside of your mouth or nose? Y Did you need to seek  medical attention at a hospital or doctor's office? N When did it last happen? Decades Ago      If all above answers are "NO", may proceed with cephalosporin use.     Pravastatin Other (See Comments)    Other reaction(s): leg cramps Other reaction(s): leg cramps Other reaction(s): leg cramps Other reaction(s): leg cramps       PHYSICAL EXAMINATION: Vital signs: BP 114/60 (BP Location: Left Arm, Patient Position: Sitting, Cuff Size: Normal)    Pulse 100    Ht 5' 1.5" (1.562 m) Comment: height measured without shoes   Wt 162 lb 2 oz (73.5 kg)    BMI 30.14 kg/m   Constitutional: generally well-appearing, no acute distress Psychiatric: alert and oriented x3, cooperative Eyes: extraocular movements intact, anicteric, conjunctiva pink Mouth: oral pharynx moist, no lesions Neck: supple no lymphadenopathy Cardiovascular: heart regular rate and rhythm, no murmur Lungs: End expiratory wheezing but moving air easily Abdomen: soft, nontender, nondistended, no obvious ascites, no peritoneal signs, normal bowel sounds, no organomegaly Rectal: Omitted Extremities: no clubbing, cyanosis, or lower extremity edema bilaterally Skin: no lesions on visible extremities Neuro: No focal deficits.  Cranial nerves intact  ASSESSMENT:  1.  Abnormal CT scan questioning gastric mass.  Rule out neoplasm 2.  Decreased appetite and weight loss.  Rule out neoplasm 3.  GERD with history of peptic stricture.  Currently asymptomatic post dilation on PPI.  Last EGD with dilation 2019 4.  History of multiple adenomatous colon polyps multiple prior colonoscopies.  Last examination June 2022 with inadequate bowel preparation   PLAN:  1.  Upper endoscopy.The nature of the procedure, as well as the risks, benefits, and alternatives were carefully and thoroughly reviewed with the patient. Ample time for discussion and questions allowed. The patient understood, was satisfied, and agreed to proceed.  Patient is set up  for tomorrow morning. 2.  Reflux precautions 3.  Continue PPI 4.  Timing of colonoscopy to be determined after evaluation of possible gastric mass is completed.  She will need 2-day prep. 5.  Asthma.  Mild wheezing today but moving air well.  She will bring her inhalers with her to her endoscopic appointment.

## 2022-01-19 NOTE — Patient Instructions (Signed)
If you are age 77 or older, your body mass index should be between 23-30. Your Body mass index is 30.14 kg/m. If this is out of the aforementioned range listed, please consider follow up with your Primary Care Provider.  If you are age 24 or younger, your body mass index should be between 19-25. Your Body mass index is 30.14 kg/m. If this is out of the aformentioned range listed, please consider follow up with your Primary Care Provider.   ________________________________________________________  The [facility] providers would like to encourage you to use [facility] to communicate with providers for non-urgent requests or questions.  Due to long hold times on the telephone, sending your provider a message by [facility] may be a faster and more efficient way to get a response.  Please allow 48 business hours for a response.  Please remember that this is for non-urgent requests.  _______________________________________________________  Dennis Bast have been scheduled for an endoscopy. Please follow written instructions given to you at your visit today. If you use inhalers (even only as needed), please bring them with you on the day of your procedure.

## 2022-01-20 ENCOUNTER — Ambulatory Visit (AMBULATORY_SURGERY_CENTER): Payer: Medicare Other | Admitting: Internal Medicine

## 2022-01-20 ENCOUNTER — Other Ambulatory Visit: Payer: Self-pay

## 2022-01-20 ENCOUNTER — Encounter: Payer: Self-pay | Admitting: Internal Medicine

## 2022-01-20 VITALS — BP 116/65 | HR 76 | Temp 97.8°F | Resp 21 | Ht 61.0 in | Wt 162.5 lb

## 2022-01-20 DIAGNOSIS — K219 Gastro-esophageal reflux disease without esophagitis: Secondary | ICD-10-CM

## 2022-01-20 DIAGNOSIS — K449 Diaphragmatic hernia without obstruction or gangrene: Secondary | ICD-10-CM | POA: Diagnosis not present

## 2022-01-20 DIAGNOSIS — R935 Abnormal findings on diagnostic imaging of other abdominal regions, including retroperitoneum: Secondary | ICD-10-CM | POA: Diagnosis not present

## 2022-01-20 DIAGNOSIS — R634 Abnormal weight loss: Secondary | ICD-10-CM | POA: Diagnosis not present

## 2022-01-20 HISTORY — PX: UPPER GASTROINTESTINAL ENDOSCOPY: SHX188

## 2022-01-20 MED ORDER — SODIUM CHLORIDE 0.9 % IV SOLN: 500.0000 mL | Freq: Once | INTRAVENOUS | Status: DC

## 2022-01-20 NOTE — Progress Notes (Signed)
HISTORY OF PRESENT ILLNESS:   [name] is a 76 y.o. female with chronic GERD and asthma.  She is referred by her pulmonologist regarding weight loss and abnormal CT scan.  She also has a history of essential thrombocythemia and sickle cell trait.  The patient reports to me that she has had somewhat decreased appetite over the past 3 months.  She reports 20 pound weight loss.  She was last seen in this office October 2019 for GERD with a history of peptic stricture.  She did undergo colonoscopy June 2022.  The examination was complete to the cecum but compromised by poor preparation for which follow-up in 1 year with more extensive preparation recommended.  Her last upper endoscopy was performed October 2019.  She was found to have esophageal stricture which was dilated.  Hiatal hernia.  Otherwise normal.  Review of blood work from January 05, 2022 shows unremarkable comprehensive metabolic panel.  CBC with hemoglobin 12.3.  She tells me that she has had some issues with her asthma in recent months.  Doing better at this time though she tells me that she has chronic wheezing.  CT scan from December 03, 2021 revealed a 4.6 cm lobulated soft tissue density structure inseparable from the posterior wall of the antrum of the stomach.  A question of neoplasm has been raised.  Endoscopy recommended.   REVIEW OF SYSTEMS:   All non-GI ROS negative unless otherwise stated in the HPI except for arthritis       Past Medical History:  Diagnosis Date   Allergic rhinitis     Allergy      seasonal allergies   Anemia      hx of   Asthma      on meds   Cataract      bilateral sx    Chronic bronchitis (HCC)     Colonic polyp     DDD (degenerative disc disease), lumbar 07/20/2011   DJD (degenerative joint disease) of knee      bilateral   DJD (degenerative joint disease), lumbar 07/20/2011   Esophageal stricture     GERD (gastroesophageal reflux disease)      on meds   Hiatal hernia     Hyperlipidemia       on meds   Osteoporosis     Stroke [facility]) 2017    "didn't know I'd had one before I was told; didn't do any damage" (11/24/2016)   Vocal cord dysfunction             Past Surgical History:  Procedure Laterality Date   CARPAL TUNNEL RELEASE Bilateral     COLONOSCOPY   2017    JP-suprep(exc)=TA x 2   COLONOSCOPY W/ BIOPSIES AND POLYPECTOMY       ESOPHAGOGASTRODUODENOSCOPY (EGD) WITH ESOPHAGEAL DILATION       FOOT SURGERY Bilateral      "might have been bunions or hammertoes"   [place] ARTHROSCOPY Left 11/06/2013   POLYPECTOMY   2017    TA x 2   TONSILLECTOMY       TOTAL ABDOMINAL HYSTERECTOMY       VIDEO BRONCHOSCOPY Bilateral 12/12/2014    Procedure: VIDEO BRONCHOSCOPY WITHOUT FLUORO;  Surgeon: Chesley Mires, MD;  Location: [facility];  Service: Cardiopulmonary;  Laterality: Bilateral;   WISDOM TOOTH EXTRACTION          Social History [name]  reports that she quit smoking about 26 years ago. Her  smoking use included cigarettes. She started smoking about 47 years ago. She has a 3.00 pack-year smoking history. She has never used smokeless tobacco. She reports current alcohol use. She reports that she does not use drugs.   family history includes Asthma in her brother and another family member; Colon cancer (age of onset: 54) in her sister; Colon cancer (age of onset: 1) in her father; Colon polyps (age of onset: 94) in her sister; Colon polyps (age of onset: 28) in her daughter; Colon polyps (age of onset: 30) in her father; Heart attack in her mother.        Allergies  Allergen Reactions   Statins Other (See Comments)   Penicillin G Rash and Other (See Comments)      Other reaction(s): rash Other reaction(s): rash Other reaction(s): rash Other reaction(s): rash   Penicillins Itching and Rash      Did it involve swelling of the face/tongue/throat, SOB, or low BP? N Did it involve sudden or severe rash/hives, skin peeling, or any reaction on  the inside of your mouth or nose? Y Did you need to seek medical attention at a hospital or doctor's office? N When did it last happen? Decades Ago      If all above answers are "NO", may proceed with cephalosporin use.       Pravastatin Other (See Comments)      Other reaction(s): leg cramps Other reaction(s): leg cramps Other reaction(s): leg cramps Other reaction(s): leg cramps          PHYSICAL EXAMINATION: Vital signs: BP 114/60 (BP Location: Left Arm, Patient Position: Sitting, Cuff Size: Normal)    Pulse 100    Ht 5' 1.5" (1.562 m) Comment: height measured without shoes   Wt 162 lb 2 oz (73.5 kg)    BMI 30.14 kg/m   Constitutional: generally well-appearing, no acute distress Psychiatric: alert and oriented x3, cooperative Eyes: extraocular movements intact, anicteric, conjunctiva pink Mouth: oral pharynx moist, no lesions Neck: supple no lymphadenopathy Cardiovascular: heart regular rate and rhythm, no murmur Lungs: End expiratory wheezing but moving air easily Abdomen: soft, nontender, nondistended, no obvious ascites, no peritoneal signs, normal bowel sounds, no organomegaly Rectal: Omitted Extremities: no clubbing, cyanosis, or lower extremity edema bilaterally Skin: no lesions on visible extremities Neuro: No focal deficits.  Cranial nerves intact   ASSESSMENT:   1.  Abnormal CT scan questioning gastric mass.  Rule out neoplasm 2.  Decreased appetite and weight loss.  Rule out neoplasm 3.  GERD with history of peptic stricture.  Currently asymptomatic post dilation on PPI.  Last EGD with dilation 2019 4.  History of multiple adenomatous colon polyps multiple prior colonoscopies.  Last examination June 2022 with inadequate bowel preparation     PLAN:   1.  Upper endoscopy.The nature of the procedure, as well as the risks, benefits, and alternatives were carefully and thoroughly reviewed with the patient. Ample time for discussion and questions allowed. The patient  understood, was satisfied, and agreed to proceed.  Patient is set up for tomorrow morning. 2.  Reflux precautions 3.  Continue PPI 4.  Timing of colonoscopy to be determined after evaluation of possible gastric mass is completed.  She will need 2-day prep. 5.  Asthma.  Mild wheezing today but moving air well.  She will bring her inhalers with her to her endoscopic appointment.

## 2022-01-20 NOTE — Progress Notes (Signed)
Pt's states no medical or surgical changes since previsit or office visit.  VS CW  

## 2022-01-20 NOTE — Progress Notes (Signed)
Pt in recovery with monitors in place, VSS. Report given to receiving RN. Bite guard was placed with pt awake to ensure comfort. No dental or soft tissue damage noted. 

## 2022-01-20 NOTE — Patient Instructions (Signed)
YOU HAD AN ENDOSCOPIC PROCEDURE TODAY: Refer to the procedure report and other information in the discharge instructions given to you for any specific questions about what was found during the examination. If this information does not answer your questions, please call [place] office at [phone] to clarify.   YOU SHOULD EXPECT: Some feelings of bloating in the abdomen. Passage of more gas than usual. Walking can help get rid of the air that was put into your GI tract during the procedure and reduce the bloating. If you had a lower endoscopy (such as a colonoscopy or flexible sigmoidoscopy) you may notice spotting of blood in your stool or on the toilet paper. Some abdominal soreness may be present for a day or two, also.  DIET: Your first meal following the procedure should be a light meal and then it is ok to progress to your normal diet. A half-sandwich or bowl of soup is an example of a good first meal. Heavy or fried foods are harder to digest and may make you feel nauseous or bloated. Drink plenty of fluids but you should avoid alcoholic beverages for 24 hours. If you had a esophageal dilation, please see attached instructions for diet.    ACTIVITY: Your care partner should take you home directly after the procedure. You should plan to take it easy, moving slowly for the rest of the day. You can resume normal activity the day after the procedure however YOU SHOULD NOT DRIVE, use power tools, machinery or perform tasks that involve climbing or major physical exertion for 24 hours (because of the sedation medicines used during the test).   SYMPTOMS TO REPORT IMMEDIATELY: A gastroenterologist can be reached at any hour. Please call [phone]  for any of the following symptoms:   Following upper endoscopy (EGD, EUS, ERCP, esophageal dilation) Vomiting of blood or coffee ground material  New, significant abdominal pain  New, significant chest pain or pain under the shoulder blades  Painful or  persistently difficult swallowing  New shortness of breath  Black, tarry-looking or red, bloody stools  FOLLOW UP:  If any biopsies were taken you will be contacted by phone or by letter within the next 1-3 weeks. Call [phone]  if you have not heard about the biopsies in 3 weeks.  Please also call with any specific questions about appointments or follow up tests.  

## 2022-01-20 NOTE — Op Note (Signed)
[place] Patient Name: [name] Procedure Date: 01/20/2022 11:28 AM MRN: [id number] Endoscopist: Docia Chuck. Henrene Pastor , MD Age: 76 Referring MD:  Date of Birth: [birth year] Gender: Female Account #: [id number] Procedure:                Upper GI endoscopy Indications:              Abnormal CT of the GI tract, Weight loss Medicines:                Monitored Anesthesia Care Procedure:                Pre-Anesthesia Assessment:                           - Prior to the procedure, a History and Physical                            was performed, and patient medications and                            allergies were reviewed. The patient's tolerance of                            previous anesthesia was also reviewed. The risks                            and benefits of the procedure and the sedation                            options and risks were discussed with the patient.                            All questions were answered, and informed consent                            was obtained. Prior Anticoagulants: The patient has                            taken no previous anticoagulant or antiplatelet                            agents. ASA Grade Assessment: II - A patient with                            mild systemic disease. After reviewing the risks                            and benefits, the patient was deemed in                            satisfactory condition to undergo the procedure.                           After obtaining informed consent, the endoscope was  passed under direct vision. Throughout the                            procedure, the patient's blood pressure, pulse, and                            oxygen saturations were monitored continuously. The                            GIF [id number] #[id number] was introduced through the                            mouth, and advanced to the second part of duodenum.                            The upper GI  endoscopy was accomplished without                            difficulty. The patient tolerated the procedure                            well. Scope In: Scope Out: Findings:                 The esophagus was normal.                           The stomach was normal save a moderately large                            sliding hiatal hernia. There were no significant                            mucosal lesions or obvious submucosal mass.                           The examined duodenum was normal.                           The cardia and gastric fundus were normal on                            retroflexion, save hiatal hernia. Complications:            No immediate complications. Estimated Blood Loss:     Estimated blood loss: none. Impression:               1. Moderately large hernia. Otherwise unremarkable                            EGD.                           2. No mucosal or obvious submucosal mass Recommendation:           - Patient has a contact number available for  emergencies. The signs and symptoms of potential                            delayed complications were discussed with the                            patient. Return to normal activities tomorrow.                            Written discharge instructions were provided to the                            patient.                           - Resume previous diet.                           - Continue present medications.                           -Consider endoscopic ultrasound for further                            evaluation of CT scan abnormality. I will discuss                            with my EUS colleagues Docia Chuck. Henrene Pastor, MD 01/20/2022 11:44:12 AM This report has been signed electronically.

## 2022-01-24 ENCOUNTER — Telehealth: Payer: Self-pay

## 2022-01-24 ENCOUNTER — Other Ambulatory Visit: Payer: Self-pay

## 2022-01-24 DIAGNOSIS — K319 Disease of stomach and duodenum, unspecified: Secondary | ICD-10-CM

## 2022-01-24 DIAGNOSIS — R935 Abnormal findings on diagnostic imaging of other abdominal regions, including retroperitoneum: Secondary | ICD-10-CM

## 2022-01-24 NOTE — Telephone Encounter (Signed)
-----   Message from Irving Copas., MD sent at 01/22/2022  6:43 AM EST ----- Regarding: RE: Abnormal CT JP, [place].  Ashiah Karpowicz, please schedule this patient next available EUS with DJ or myself to rule out gastric lesion and abnormal CT scan. Thanks. GM ----- Message ----- From: Irene Shipper, MD Sent: 01/21/2022  10:07 AM EST To: Milus Banister, MD, Irene Shipper, MD, # Subject: RE: Abnormal CT                                Gabe, Thanks for reviewing the case. This is not urgent, but I would favor EUS by chance there is an identifiable abnormality that needs FNA.  I did discuss this with the patient. Thanks, John  ----- Message ----- From: Irving Copas., MD Sent: 01/21/2022   2:31 AM EST To: Milus Banister, MD, Irene Shipper, MD Subject: RE: Abnormal CT                                JP, I reviewed the imaging.  This "soft tissue density" looked to be intra gastric so you seeing nothing means that it is very unlikely that anything is present.  With that being said I do think EUS is not unreasonable due to the reading.  The other thing that could be considered would be a repeat CT scan to show that nothing is present and then we could be done with that as well. In either case let us know what you would like to do.  Our EUS's are going to be scheduled out for at least a month but seeing as you did not see anything extrinsic either again suspect this was an overcall by radiology and okay to schedule at that point. If you want [place] to move forward with the EUS then just reply back and we will have [place] Condon work on scheduling. Thanks. GM ----- Message ----- From: Irene Shipper, MD Sent: 01/20/2022  11:40 AM EST To: Milus Banister, MD, # Subject: Abnormal CT                                    [place],  This patient was sent to me after she underwent a CT scan to evaluate unexplained weight loss.  The relevant abnormality as follows:  "There is 4.6 cm lobulated soft tissue  density structure inseparable from the posterior wall of the antrum of the stomach. Possibility of malignant neoplasm in the stomach is not excluded. Endoscopy as clinically warranted should be considered"  I just completed upper endoscopy.  No mucosal lesion or obvious submucosal lesion.  Do you think that EUS is warranted to further evaluate this CT abnormality?  Thanks,  Jenny Reichmann

## 2022-01-24 NOTE — Telephone Encounter (Signed)
EUS scheduled, pt instructed and medications reviewed.  Patient instructions mailed to home.  Patient to call with any questions or concerns.  

## 2022-01-24 NOTE — Telephone Encounter (Signed)
EUS has been scheduled with DJ at [facility] on 02/03/22 at 1045 am Left message on machine to call back

## 2022-01-25 ENCOUNTER — Telehealth: Payer: Self-pay | Admitting: *Deleted

## 2022-01-25 NOTE — Telephone Encounter (Signed)
°  Follow up Call-  Call back number 01/20/2022 05/12/2021  Post procedure Call Back phone  # [phone]  Permission to leave phone message Yes Yes  Some recent data might be hidden     Patient questions:  Do you have a fever, pain , or abdominal swelling? No. Pain Score  0 *  Have you tolerated food without any problems? Yes.    Have you been able to return to your normal activities? Yes.    Do you have any questions about your discharge instructions: Diet   No. Medications  No. Follow up visit  No.  Do you have questions or concerns about your Care? No.  Actions: * If pain score is 4 or above: No action needed, pain <4.

## 2022-01-26 ENCOUNTER — Encounter (HOSPITAL_COMMUNITY): Payer: Self-pay | Admitting: Gastroenterology

## 2022-01-26 NOTE — Progress Notes (Signed)
Attempted to obtain medical history via telephone, unable to reach at this time. I left a voicemail to return pre surgical testing department's phone call.  

## 2022-02-03 ENCOUNTER — Ambulatory Visit (HOSPITAL_COMMUNITY): Payer: Medicare Other | Admitting: Certified Registered Nurse Anesthetist

## 2022-02-03 ENCOUNTER — Ambulatory Visit (HOSPITAL_COMMUNITY)
Admission: RE | Admit: 2022-02-03 | Discharge: 2022-02-03 | Disposition: A | Discharge status: Home / Self Care | Payer: Medicare Other | Attending: Gastroenterology | Admitting: Gastroenterology

## 2022-02-03 ENCOUNTER — Encounter (HOSPITAL_COMMUNITY)
Admission: RE | Disposition: A | Discharge status: Home / Self Care | Payer: Self-pay | Source: Home / Self Care | Attending: Gastroenterology

## 2022-02-03 ENCOUNTER — Ambulatory Visit (HOSPITAL_BASED_OUTPATIENT_CLINIC_OR_DEPARTMENT_OTHER): Payer: Medicare Other | Admitting: Certified Registered Nurse Anesthetist

## 2022-02-03 ENCOUNTER — Other Ambulatory Visit: Payer: Self-pay

## 2022-02-03 ENCOUNTER — Encounter (HOSPITAL_COMMUNITY): Payer: Self-pay | Admitting: Gastroenterology

## 2022-02-03 ENCOUNTER — Other Ambulatory Visit: Payer: Self-pay | Admitting: *Deleted

## 2022-02-03 DIAGNOSIS — R935 Abnormal findings on diagnostic imaging of other abdominal regions, including retroperitoneum: Secondary | ICD-10-CM

## 2022-02-03 DIAGNOSIS — R933 Abnormal findings on diagnostic imaging of other parts of digestive tract: Secondary | ICD-10-CM | POA: Insufficient documentation

## 2022-02-03 DIAGNOSIS — Z87891 Personal history of nicotine dependence: Secondary | ICD-10-CM | POA: Insufficient documentation

## 2022-02-03 DIAGNOSIS — K319 Disease of stomach and duodenum, unspecified: Secondary | ICD-10-CM | POA: Diagnosis not present

## 2022-02-03 DIAGNOSIS — R634 Abnormal weight loss: Secondary | ICD-10-CM | POA: Diagnosis not present

## 2022-02-03 DIAGNOSIS — Z8673 Personal history of transient ischemic attack (TIA), and cerebral infarction without residual deficits: Secondary | ICD-10-CM

## 2022-02-03 DIAGNOSIS — K219 Gastro-esophageal reflux disease without esophagitis: Secondary | ICD-10-CM | POA: Insufficient documentation

## 2022-02-03 DIAGNOSIS — J45909 Unspecified asthma, uncomplicated: Secondary | ICD-10-CM | POA: Insufficient documentation

## 2022-02-03 HISTORY — PX: ESOPHAGOGASTRODUODENOSCOPY: SHX5428

## 2022-02-03 HISTORY — PX: EUS: SHX5427

## 2022-02-03 SURGERY — UPPER ENDOSCOPIC ULTRASOUND (EUS) RADIAL
Anesthesia: Monitor Anesthesia Care

## 2022-02-03 MED ORDER — LACTATED RINGERS IV SOLN
INTRAVENOUS | Status: DC | PRN
Start: 2022-02-03 — End: 2022-02-03

## 2022-02-03 MED ORDER — PROPOFOL 500 MG/50ML IV EMUL
INTRAVENOUS | Status: DC | PRN
  Administered 2022-02-03: 125 ug/kg/min via INTRAVENOUS

## 2022-02-03 MED ORDER — PROPOFOL 1000 MG/100ML IV EMUL
INTRAVENOUS | Status: AC
  Filled 2022-02-03: qty 100

## 2022-02-03 MED ORDER — LIDOCAINE 2% (20 MG/ML) 5 ML SYRINGE
INTRAMUSCULAR | Status: DC | PRN
  Administered 2022-02-03: 80 mg via INTRAVENOUS

## 2022-02-03 MED ORDER — PROPOFOL 10 MG/ML IV BOLUS
INTRAVENOUS | Status: DC | PRN
  Administered 2022-02-03: 20 mg via INTRAVENOUS

## 2022-02-03 MED ORDER — SODIUM CHLORIDE 0.9 % IV SOLN: INTRAVENOUS | Status: DC

## 2022-02-03 NOTE — Anesthesia Procedure Notes (Signed)
Procedure Name: [place] ?Date/Time: 02/03/2022 10:19 AM ?Performed by: West Pugh, CRNA ?Pre-anesthesia Checklist: Patient identified, Emergency Drugs available, Suction available, Patient being monitored and Timeout performed ?Patient Re-evaluated:Patient Re-evaluated prior to induction ?Oxygen Delivery Method: Simple face mask ?Preoxygenation: Pre-oxygenation with 100% oxygen ?Placement Confirmation: positive ETCO2 ? ? ? ? ?

## 2022-02-03 NOTE — Anesthesia Postprocedure Evaluation (Signed)
Anesthesia Post Note ? ?Patient: [name] ? ?Procedure(s) Performed: UPPER ENDOSCOPIC ULTRASOUND (EUS) RADIAL ?ESOPHAGOGASTRODUODENOSCOPY (EGD) ? ?  ? ?Patient location during evaluation: PACU ?Anesthesia Type: MAC ?Level of consciousness: awake and alert ?Pain management: pain level controlled ?Vital Signs Assessment: post-procedure vital signs reviewed and stable ?Respiratory status: spontaneous breathing, nonlabored ventilation, respiratory function stable and patient connected to nasal cannula oxygen ?Cardiovascular status: stable and blood pressure returned to baseline ?Postop Assessment: no apparent nausea or vomiting ?Anesthetic complications: no ? ? ?No notable events documented. ? ?Last Vitals:  ?Vitals:  ? 02/03/22 1040 02/03/22 1050  ?BP: 118/64 (!) 145/68  ?Pulse: 86 78  ?Resp: 18 17  ?Temp: (!) 36.1 ?C   ?SpO2: 97% 99%  ?  ?Last Pain:  ?Vitals:  ? 02/03/22 1050  ?TempSrc:   ?PainSc: 0-No pain  ? ? ?  ?  ?  ?  ?  ?  ? ?Onyinyechi Huante S ? ? ? ? ?

## 2022-02-03 NOTE — Discharge Instructions (Signed)
YOU HAD AN ENDOSCOPIC PROCEDURE TODAY: Refer to the procedure report and other information in the discharge instructions given to you for any specific questions about what was found during the examination. If this information does not answer your questions, please call [place] office at [phone] to clarify.  ° °YOU SHOULD EXPECT: Some feelings of bloating in the abdomen. Passage of more gas than usual. Walking can help get rid of the air that was put into your GI tract during the procedure and reduce the bloating. If you had a lower endoscopy (such as a colonoscopy or flexible sigmoidoscopy) you may notice spotting of blood in your stool or on the toilet paper. Some abdominal soreness may be present for a day or two, also. ° °DIET: Your first meal following the procedure should be a light meal and then it is ok to progress to your normal diet. A half-sandwich or bowl of soup is an example of a good first meal. Heavy or fried foods are harder to digest and may make you feel nauseous or bloated. Drink plenty of fluids but you should avoid alcoholic beverages for 24 hours. If you had a esophageal dilation, please see attached instructions for diet.   ° °ACTIVITY: Your care partner should take you home directly after the procedure. You should plan to take it easy, moving slowly for the rest of the day. You can resume normal activity the day after the procedure however YOU SHOULD NOT DRIVE, use power tools, machinery or perform tasks that involve climbing or major physical exertion for 24 hours (because of the sedation medicines used during the test).  ° °SYMPTOMS TO REPORT IMMEDIATELY: °A gastroenterologist can be reached at any hour. Please call [phone]  for any of the following symptoms:  °Following lower endoscopy (colonoscopy, flexible sigmoidoscopy) °Excessive amounts of blood in the stool  °Significant tenderness, worsening of abdominal pains  °Swelling of the abdomen that is new, acute  °Fever of 100° or  higher  °Following upper endoscopy (EGD, EUS, ERCP, esophageal dilation) °Vomiting of blood or coffee ground material  °New, significant abdominal pain  °New, significant chest pain or pain under the shoulder blades  °Painful or persistently difficult swallowing  °New shortness of breath  °Black, tarry-looking or red, bloody stools ° °FOLLOW UP:  °If any biopsies were taken you will be contacted by phone or by letter within the next 1-3 weeks. Call [phone]  if you have not heard about the biopsies in 3 weeks.  °Please also call with any specific questions about appointments or follow up tests. ° °

## 2022-02-03 NOTE — Patient Outreach (Signed)
[place] [facility]) Care Management ? ?02/03/2022 ? ?[name] ?[birth year] ?[id number] ? ? ?[facility] Case closure - unable to maintain contact/external CM ? ?Unsuccessful outreach letter sent on 01/05/22 ?Unsuccessful outreach on 12/29/21, 01/05/22, 01/12/22 without a response  ? ?Plan [facility] RN CM will close case after no response from patient within 10 business days --possible external Cm program (pcp office)  ?Unable to reach ?Case closure letters sent to patient and MD ?Referral to external CM program sent via e-mail ? ?Jillienne Egner L. [name], RN, BSN, CCM ?[facility] [facility] Care Coordinator ?Office number [phone] ?[facility] number [phone] ?Fax number [phone] ? ?

## 2022-02-03 NOTE — Op Note (Signed)
[facility] ?Patient Name: [name] ?Procedure Date: 02/03/2022 ?MRN: [id number] ?Attending MD: Milus Banister , MD ?Date of Birth: 11-Dec-1945 ?CSN: [id number] ?Age: 76 ?Admit Type: Inpatient ?Procedure:                Upper EUS ?Indications:              Suspected mass in stomach on CT scan, Weight loss ?Providers:                Milus Banister, MD, Burtis Junes, RN, Charlean Merl  ?                          Purcell Nails, Technician, Tyna Jaksch Technician,  ?                          Christell Faith, CRNA ?Referring MD:             Scarlette Shorts, MD ?Medicines:                Monitored Anesthesia Care ?Complications:            No immediate complications. Estimated blood loss:  ?                          None. ?Estimated Blood Loss:     Estimated blood loss: none. ?Procedure:                Pre-Anesthesia Assessment: ?                          - Prior to the procedure, a History and Physical  ?                          was performed, and patient medications and  ?                          allergies were reviewed. The patient's tolerance of  ?                          previous anesthesia was also reviewed. The risks  ?                          and benefits of the procedure and the sedation  ?                          options and risks were discussed with the patient.  ?                          All questions were answered, and informed consent  ?                          was obtained. Prior Anticoagulants: The patient has  ?                          taken no previous anticoagulant or antiplatelet  ?  agents. ASA Grade Assessment: II - A patient with  ?                          mild systemic disease. After reviewing the risks  ?                          and benefits, the patient was deemed in  ?                          satisfactory condition to undergo the procedure. ?                          After obtaining informed consent, the endoscope was  ?                          passed under  direct vision. Throughout the  ?                          procedure, the patient's blood pressure, pulse, and  ?                          oxygen saturations were monitored continuously. The  ?                          [id number]-AL5 ([id number]) Olympus radial ultrasound  ?                          scope was introduced through the mouth, and  ?                          advanced to the second part of duodenum. The upper  ?                          EUS was accomplished without difficulty. The  ?                          patient tolerated the procedure well. ?Scope In: ?Scope Out: ?Findings: ?     ENDOSCOPIC FINDING: : ?     The examined esophagus was endoscopically normal. ?     The entire examined stomach was endoscopically normal. ?     The examined duodenum was endoscopically normal. ?     EUS findings (with radial echoendoscope) ?     1. Normal gastric wall echo layering. No masses. ?     2. Limited views of the pancreas, liver, bile duct, spleen, portal and  ?     splenic vessels were all normal. ?     3. No perigastric adenopathy ?Impression:               - Normal endoscopic and endsonographic evaluation  ?                          of the stomach ?                          - I suspect that the  finding described on CT was  ?                          retained food products, possibly acting like a  ?                          bezoar causing some GI symptoms. Her symptoms are  ?                          all resolved. No further testing is necessary. ?Moderate Sedation: ?     Not Applicable - Patient had care per Anesthesia. ?Recommendation:           - Discharge patient to home. ?                          - Follow up with Dr. Henrene Pastor as needed. ?Procedure Code(s):        --- Professional --- ?                          [phone], Esophagogastroduodenoscopy, flexible,  ?                          transoral; diagnostic, including collection of  ?                          specimen(s) by brushing or washing, when performed  ?                           (separate procedure) ?Diagnosis Code(s):        --- Professional --- ?                          R63.4, Abnormal weight loss ?                          R93.3, Abnormal findings on diagnostic imaging of  ?                          other parts of digestive tract ?CPT copyright 2019 American Medical Association. All rights reserved. ?The codes documented in this report are preliminary and upon coder review may  ?be revised to meet current compliance requirements. ?Milus Banister, MD ?02/03/2022 10:41:44 AM ?This report has been signed electronically. ?Number of Addenda: 0 ?

## 2022-02-03 NOTE — Interval H&P Note (Signed)
History and Physical Interval Note: ? ?02/03/2022 ?9:39 AM ? ?[name]  has presented today for surgery, with the diagnosis of gastric lesion, abn CT.  The various methods of treatment have been discussed with the patient and family. After consideration of risks, benefits and other options for treatment, the patient has consented to  Procedure(s): ?UPPER ENDOSCOPIC ULTRASOUND (EUS) RADIAL (N/A) as a surgical intervention.  The patient's history has been reviewed, patient examined, no change in status, stable for surgery.  I have reviewed the patient's chart and labs.  Questions were answered to the patient's satisfaction.   ? ? ?Milus Banister ? ? ?

## 2022-02-03 NOTE — Anesthesia Preprocedure Evaluation (Signed)
Anesthesia Evaluation  ?Patient identified by MRN, date of birth, ID band ?Patient awake ? ? ? ?Reviewed: ?Allergy & Precautions, NPO status , Patient's Chart, lab work & pertinent test results ? ?Airway ?Mallampati: II ? ?TM Distance: >3 FB ?Neck ROM: Full ? ? ? Dental ?no notable dental hx. ? ?  ?Pulmonary ?asthma , former smoker,  ?  ?Pulmonary exam normal ?breath sounds clear to auscultation ? ? ? ? ? ? Cardiovascular ?negative cardio ROS ?Normal cardiovascular exam ?Rhythm:Regular Rate:Normal ? ? ?  ?Neuro/Psych ?TIACVA negative psych ROS  ? GI/Hepatic ?Neg liver ROS, GERD  ,  ?Endo/Other  ?negative endocrine ROS ? Renal/GU ?negative Renal ROS  ?negative genitourinary ?  ?Musculoskeletal ?negative musculoskeletal ROS ?(+)  ? Abdominal ?  ?Peds ?negative pediatric ROS ?(+)  Hematology ?negative hematology ROS ?(+)   ?Anesthesia Other Findings ? ? Reproductive/Obstetrics ?negative OB ROS ? ?  ? ? ? ? ? ? ? ? ? ? ? ? ? ?  ?  ? ? ? ? ? ? ? ? ?Anesthesia Physical ?Anesthesia Plan ? ?ASA: 2 ? ?Anesthesia Plan: MAC  ? ?Post-op Pain Management: Minimal or no pain anticipated  ? ?Induction: Intravenous ? ?PONV Risk Score and Plan: 2 and Propofol infusion and Treatment may vary due to age or medical condition ? ?Airway Management Planned: Simple Face Mask ? ?Additional Equipment:  ? ?Intra-op Plan:  ? ?Post-operative Plan:  ? ?Informed Consent: I have reviewed the patients History and Physical, chart, labs and discussed the procedure including the risks, benefits and alternatives for the proposed anesthesia with the patient or authorized representative who has indicated his/her understanding and acceptance.  ? ? ? ?Dental advisory given ? ?Plan Discussed with: CRNA and Surgeon ? ?Anesthesia Plan Comments:   ? ? ? ? ? ? ?Anesthesia Quick Evaluation ? ?

## 2022-02-03 NOTE — Transfer of Care (Signed)
Immediate Anesthesia Transfer of Care Note ? ?Patient: [name] ? ?Procedure(s) Performed: UPPER ENDOSCOPIC ULTRASOUND (EUS) RADIAL ?ESOPHAGOGASTRODUODENOSCOPY (EGD) ? ?Patient Location: PACU ? ?Anesthesia Type:MAC ? ?Level of Consciousness: drowsy and patient cooperative ? ?Airway & Oxygen Therapy: Patient Spontanous Breathing and Patient connected to face mask oxygen ? ?Post-op Assessment: Report given to RN and Post -op Vital signs reviewed and stable ? ?Post vital signs: Reviewed and stable ? ?Last Vitals:  ?Vitals Value Taken Time  ?BP 118/64 02/03/22 1040  ?Temp    ?Pulse 89 02/03/22 1040  ?Resp 23 02/03/22 1040  ?SpO2 97 % 02/03/22 1040  ?Vitals shown include unvalidated device data. ? ?Last Pain:  ?Vitals:  ? 02/03/22 0929  ?TempSrc: Temporal  ?PainSc: 0-No pain  ?   ? ?  ? ?Complications: No notable events documented. ?

## 2022-02-06 ENCOUNTER — Encounter (HOSPITAL_COMMUNITY): Payer: Self-pay | Admitting: Gastroenterology

## 2022-02-18 ENCOUNTER — Inpatient Hospital Stay: Payer: Medicare Other | Attending: Hematology & Oncology

## 2022-02-18 ENCOUNTER — Inpatient Hospital Stay: Payer: Medicare Other | Admitting: Hematology & Oncology

## 2022-03-11 ENCOUNTER — Ambulatory Visit: Payer: Medicare Other | Admitting: Emergency Medicine

## 2022-05-03 DIAGNOSIS — I700 Atherosclerosis of aorta: Secondary | ICD-10-CM | POA: Diagnosis not present

## 2022-05-03 DIAGNOSIS — R7309 Other abnormal glucose: Secondary | ICD-10-CM | POA: Diagnosis not present

## 2022-05-03 DIAGNOSIS — J432 Centrilobular emphysema: Secondary | ICD-10-CM | POA: Diagnosis not present

## 2022-05-03 DIAGNOSIS — R5383 Other fatigue: Secondary | ICD-10-CM | POA: Diagnosis not present

## 2022-05-03 DIAGNOSIS — D473 Essential (hemorrhagic) thrombocythemia: Secondary | ICD-10-CM | POA: Diagnosis not present

## 2022-05-03 DIAGNOSIS — E782 Mixed hyperlipidemia: Secondary | ICD-10-CM | POA: Diagnosis not present

## 2022-05-09 DIAGNOSIS — Z23 Encounter for immunization: Secondary | ICD-10-CM | POA: Diagnosis not present

## 2022-05-09 DIAGNOSIS — M059 Rheumatoid arthritis with rheumatoid factor, unspecified: Secondary | ICD-10-CM | POA: Diagnosis not present

## 2022-05-09 DIAGNOSIS — J849 Interstitial pulmonary disease, unspecified: Secondary | ICD-10-CM | POA: Diagnosis not present

## 2022-05-09 DIAGNOSIS — D473 Essential (hemorrhagic) thrombocythemia: Secondary | ICD-10-CM | POA: Diagnosis not present

## 2022-05-09 DIAGNOSIS — G4762 Sleep related leg cramps: Secondary | ICD-10-CM | POA: Diagnosis not present

## 2022-05-09 DIAGNOSIS — Z0000 Encounter for general adult medical examination without abnormal findings: Secondary | ICD-10-CM | POA: Diagnosis not present

## 2022-05-09 DIAGNOSIS — I700 Atherosclerosis of aorta: Secondary | ICD-10-CM | POA: Diagnosis not present

## 2022-05-09 DIAGNOSIS — J432 Centrilobular emphysema: Secondary | ICD-10-CM | POA: Diagnosis not present

## 2022-05-09 DIAGNOSIS — E782 Mixed hyperlipidemia: Secondary | ICD-10-CM | POA: Diagnosis not present

## 2022-05-09 DIAGNOSIS — G250 Essential tremor: Secondary | ICD-10-CM | POA: Diagnosis not present

## 2022-05-23 DIAGNOSIS — Z1231 Encounter for screening mammogram for malignant neoplasm of breast: Secondary | ICD-10-CM | POA: Diagnosis not present

## 2022-06-23 ENCOUNTER — Encounter: Payer: Self-pay | Admitting: Internal Medicine

## 2022-07-27 NOTE — Progress Notes (Unsigned)
07/27/2022 ITXEL WICKARD [id number] 08/02/1946   Chief Complaint: Discuss scheduling a colonoscopy   History of Present Illness: Remedy Corporan is a 76 year old female with a past medical history of asthma, anemia, sickle cell trait, thrombocythemia on Hydroxyurea, GERD, peptic stricture and tubular adenomatous colon polyps. She is known by Dr. Henrene Pastor.  She presents today to schedule a colonoscopy.  Her most recent colonoscopy was 05/12/2021 which resulted in an inadequate bowel prep.  She was advised to repeat a colonoscopy in 1 year.  Father and 2 sisters with history of colon cancer.  She denies having any abdominal pain.  She passes a normal formed brown bowel movement most days.  She has intermittent constipation for which she takes MiraLAX as needed.  No rectal bleeding or black stools.  Her GERD symptoms are well controlled on Pantoprazole 20 mg twice daily.  She denies having any dysphagia, heartburn or upper abdominal pain.  Her most recent EGD was done 01/20/2022 secondary to an abnormal CT which showed a 4.6 x 2.4 cm soft tissue density in the antrum of the stomach and weight loss.  The EGD showed a large hiatal hernia without evidence of a submucosal mass.  She underwent a EUS by Dr. Ardis Hughs 02/03/2022 which was negative, a prior bezoar/retained food products was likely the cause of the abnormal CT findings.  She has a history of asthma, followed by pulmonologist Dr. Lamonte Sakai.  She continues to have daily wheezing despite taking Singulair,Breztri  and Albuterol.  She denies having any shortness of breath or productive cough.  She last used albuterol nebulizer 2 weeks ago.  She has a history of anemia.  She takes oral iron 1 tab daily for many years.  Weight has remained stable.       Latest Ref Rng & Units 01/05/2022    7:41 AM 10/05/2021    7:37 AM 09/04/2021    5:28 AM  CBC  WBC 4.0 - 10.5 K/uL 7.8  7.0  8.2   Hemoglobin 12.0 - 15.0 g/dL 12.3  11.4  11.8   Hematocrit 36.0 - 46.0 % 36.7   33.2  36.4   Platelets 150 - 400 K/uL 454  390  292        Latest Ref Rng & Units 01/05/2022    7:41 AM 10/05/2021    7:37 AM 09/04/2021    5:28 AM  CMP  Glucose 70 - 99 mg/dL 133  93  195   BUN 8 - 23 mg/dL '9  8  13   '$ Creatinine 0.44 - 1.00 mg/dL 0.83  0.78  0.96   Sodium 135 - 145 mmol/L 140  141  139   Potassium 3.5 - 5.1 mmol/L 4.2  3.8  4.2   Chloride 98 - 111 mmol/L 105  108  103   CO2 22 - 32 mmol/L '26  26  25   '$ Calcium 8.9 - 10.3 mg/dL 9.7  9.1  9.0   Total Protein 6.5 - 8.1 g/dL 6.2  5.8    Total Bilirubin 0.3 - 1.2 mg/dL 0.4  0.5    Alkaline Phos 38 - 126 U/L 71  62    AST 15 - 41 U/L 14  14    ALT 0 - 44 U/L 12  13      MOST RECENT GI PROCEDURES:  EUS by Dr. Ardis Hughs 02/03/2022: Normal endoscopic and endsonographic evaluation of the stomach - I suspect that the finding described on CT was  retained food products, possibly acting like - I suspect that the finding described on CT was retained food products, possibly acting like a bezoar causing some GI symptoms. Her symptoms are all resolved. No further testing is Necessary  EGD 01/20/2022 by Dr. Henrene Pastor: 1. Moderately large hernia. Otherwise unremarkable EGD. 2. No mucosal or obvious submucosal mass  Colonoscopy 05/12/2021: - Preparation of the colon was inadequate. - The entire examined colon is normal on direct and retroflexion views. - No specimens collected. - Repeat colonoscopy in 1 year, extensive prep   EGD 09/25/2018: - Benign-appearing esophageal stenosis. Dilated. - Normal stomach with hiatal hernia. - Normal examined duodenum. - No specimens collected.  Colonoscopy 12/30/2015: 1. Two polyps were found in the ascending colon and sigmoid colon; polypectomy was performed with a cold snare 2. The examination was otherwise normal 1. Surgical [P], ascending, polyp - TUBULAR ADENOMA. NO HIGH GRADE DYSPLASIA OR MALIGNANCY IDENTIFIED. 2. Surgical [P], sigmoid, polyp - TUBULAR ADENOMA. NO HIGH GRADE DYSPLASIA OR  MALIGNANCY IDENTIFIED.  Colonoscopy 09/20/2012: 1.  2 diminutive polyps were found in the ascending colon and transverse colon, polypectomy was performed with a cold snare 2.  The colon was otherwise normal - TUBULAR ADENOMA (THREE FRAGMENTS). - NO HIGH GRADE DYSPLASIA OR MALIGNANCY IDENTIFIED  Past Medical History:  Diagnosis Date   Allergic rhinitis    Allergy    seasonal allergies   Anemia    hx of   Asthma    on meds   Cataract    bilateral sx    Chronic bronchitis (HCC)    Colonic polyp    DDD (degenerative disc disease), lumbar 07/20/2011   DJD (degenerative joint disease) of knee    bilateral   DJD (degenerative joint disease), lumbar 07/20/2011   Esophageal stricture    GERD (gastroesophageal reflux disease)    on meds   Hiatal hernia    Hyperlipidemia    on meds   Osteoporosis    Stroke [facility]) 2017   "didn't know I'd had one before I was told; didn't do any damage" (11/24/2016)   Vocal cord dysfunction    Past Surgical History:  Procedure Laterality Date   CARPAL TUNNEL RELEASE Bilateral    COLONOSCOPY  2017   JP-suprep(exc)=TA x 2   COLONOSCOPY W/ BIOPSIES AND POLYPECTOMY     ESOPHAGOGASTRODUODENOSCOPY N/A 02/03/2022   Procedure: ESOPHAGOGASTRODUODENOSCOPY (EGD);  Surgeon: Milus Banister, MD;  Location: [facility];  Service: Endoscopy;  Laterality: N/A;   ESOPHAGOGASTRODUODENOSCOPY (EGD) WITH ESOPHAGEAL DILATION     EUS N/A 02/03/2022   Procedure: UPPER ENDOSCOPIC ULTRASOUND (EUS) RADIAL;  Surgeon: Milus Banister, MD;  Location: [facility];  Service: Endoscopy;  Laterality: N/A;   FOOT SURGERY Bilateral    "might have been bunions or hammertoes"   [facility] ARTHROSCOPY Left 11/06/2013   POLYPECTOMY  2017   TA x 2   TONSILLECTOMY     TOTAL ABDOMINAL HYSTERECTOMY     UPPER GASTROINTESTINAL ENDOSCOPY  01/20/2022   VIDEO BRONCHOSCOPY Bilateral 12/12/2014   Procedure: VIDEO BRONCHOSCOPY WITHOUT FLUORO;  Surgeon: Chesley Mires, MD;   Location: [place];  Service: Cardiopulmonary;  Laterality: Bilateral;   WISDOM TOOTH EXTRACTION     Current Outpatient Medications on File Prior to Visit  Medication Sig Dispense Refill   albuterol (VENTOLIN HFA) 108 (90 Base) MCG/ACT inhaler Inhale 2 puffs into the lungs every 6 (six) hours as needed for wheezing or shortness of breath. 8 g 2   aspirin  EC 81 MG tablet Take 81 mg by mouth in the morning.     Budeson-Glycopyrrol-Formoterol (BREZTRI AEROSPHERE) 160-9-4.8 MCG/ACT AERO Inhale 2 puffs into the lungs in the morning and at bedtime. 10.7 g 11   CALCIUM-CHOLECALCIFEROL PO Take 1 tablet by mouth in the morning.     cetirizine (ZYRTEC ALLERGY) 10 MG tablet Take 1 tablet (10 mg total) by mouth daily. 30 tablet 5   cholecalciferol (VITAMIN D3) 25 MCG (1000 UNIT) tablet Take 1,000 Units by mouth daily.     fluticasone (FLONASE) 50 MCG/ACT nasal spray Place 1 spray into the nose 2 (two) times daily.     folic acid (FOLVITE) 675 MCG tablet Take 400 mcg by mouth in the morning.     gabapentin (NEURONTIN) 100 MG capsule Take 100 mg by mouth at bedtime.     hydroxyurea (HYDREA) 500 MG capsule Take 1 capsule (500 mg total) by mouth 3 (three) times daily before meals. 90 capsule 6   magnesium oxide (MAG-OX) 400 MG tablet Take 400 mg by mouth in the morning.     montelukast (SINGULAIR) 10 MG tablet Take 1 tablet (10 mg total) by mouth at bedtime. 90 tablet 3   Multiple Vitamins-Minerals (CENTRUM SILVER 50+WOMEN PO) Take 1 tablet by mouth in the morning.     pantoprazole (PROTONIX) 20 MG tablet Take 20 mg by mouth 2 (two) times daily.     rosuvastatin (CRESTOR) 10 MG tablet Take 10 mg by mouth at bedtime.     vitamin B-12 (CYANOCOBALAMIN) 1000 MCG tablet Take 1,000 mcg by mouth in the morning and at bedtime.     acetaminophen (TYLENOL) 500 MG tablet Take 500-1,000 mg by mouth every 6 (six) hours as needed (pain.). (Patient not taking: Reported on 07/28/2022)     albuterol (PROVENTIL) (2.5 MG/3ML)  0.083% nebulizer solution Take 3 mLs (2.5 mg total) by nebulization every 4 (four) hours as needed for shortness of breath. (Patient not taking: Reported on 07/28/2022) 300 mL 3   No current facility-administered medications on file prior to visit.   Allergies  Allergen Reactions   Statins Other (See Comments)    Leg cramps   Penicillins Itching and Rash    Did it involve swelling of the face/tongue/throat, SOB, or low BP? N Did it involve sudden or severe rash/hives, skin peeling, or any reaction on the inside of your mouth or nose? Y Did you need to seek medical attention at a hospital or doctor's office? N When did it last happen? Decades Ago      If all above answers are "NO", may proceed with cephalosporin use.     Pravastatin Other (See Comments)    leg cramps    Current Medications, Allergies, Past Medical History, Past Surgical History, Family History and Social History were reviewed in [employer] record.  Review of Systems:   Constitutional: Negative for fever, sweats, chills or weight loss.  Respiratory: See HPI. Cardiovascular: Negative for chest pain, palpitations and leg swelling.  Gastrointestinal: See HPI.  Musculoskeletal: Negative for back pain or muscle aches.  Neurological: Negative for dizziness, headaches or paresthesias.   Physical Exam: BP 124/76   Pulse 95   Ht '5\' 1"'$  (1.549 m)   Wt 166 lb (75.3 kg)   BMI 31.37 kg/m   Wt Readings from Last 3 Encounters:  07/28/22 166 lb (75.3 kg)  02/03/22 162 lb (73.5 kg)  01/20/22 162 lb 8 oz (73.7 kg)     General: 76 year old female  in no acute distress. Head: Normocephalic and atraumatic. Eyes: No scleral icterus. Conjunctiva pink . Ears: Normal auditory acuity. Mouth: Upper dentures.  No ulcers or lesions.  Lungs: Clear throughout to auscultation. Heart: Regular rate and rhythm, no murmur. Abdomen: Soft, nontender and nondistended. No masses or hepatomegaly. Normal bowel sounds x 4  quadrants.  Midline lower abdominal scar intact. Rectal: Deferred. Musculoskeletal: Symmetrical with no gross deformities. Extremities: No edema. Neurological: Alert oriented x 4. No focal deficits.  Psychological: Alert and cooperative. Normal mood and affect  Assessment and Recommendations:  50) 76 year old female with history of colon polyps, father and 2 sisters with history of colon cancer -Colonoscopy benefits and risks discussed including risk with sedation, risk of bleeding, perforation and infection  -Two day bowel prep -Patient instructed to hold oral iron supplements for 7 days prior to colonoscopy prep date, restart after colonoscopy completed -Pulmonary clearance required prior to proceeding with colonoscopy  2) GERD, esophageal stricture.  EGD 01/20/2022 identified a large hiatal hernia otherwise was unremarkable.  Asymptomatic. -Continue Pantoprazole 20 mg p.o. twice daily  3) Asthma, diffuse expiratory wheezes on exam  -Patient to schedule appoint with neurologist Dr. Lamonte Sakai for a follow-up appointment and for pulmonary clearance prior to proceeding with a colonoscopy  4) Chronic anemia, sickle cell trait.  Patient reports taking OTC oral iron once daily. -Follow-up with Dr. Marin Olp, verify if patient needs to take oral iron  5) Thrombocythemia on hydroxyurea

## 2022-07-28 ENCOUNTER — Encounter: Payer: Self-pay | Admitting: Nurse Practitioner

## 2022-07-28 ENCOUNTER — Ambulatory Visit (INDEPENDENT_AMBULATORY_CARE_PROVIDER_SITE_OTHER): Payer: Medicare Other | Admitting: Nurse Practitioner

## 2022-07-28 VITALS — BP 124/76 | HR 95 | Ht 61.0 in | Wt 166.0 lb

## 2022-07-28 DIAGNOSIS — Z86010 Personal history of colonic polyps: Secondary | ICD-10-CM | POA: Diagnosis not present

## 2022-07-28 MED ORDER — NA SULFATE-K SULFATE-MG SULF 17.5-3.13-1.6 GM/177ML PO SOLN: 1.0000 | Freq: Once | ORAL | 0 refills | Status: AC

## 2022-07-28 NOTE — Progress Notes (Signed)
Assessment and plan noted ?

## 2022-07-28 NOTE — Patient Instructions (Addendum)
1) DO NOT TAKE YOUR ORAL IRON SUPPLEMENT FOR ONE WEEK PRIOR TO YOUR COLONOSCOPY DATE, RESTART AFTER COLONOSCOPY COMPLETED   2) TAKE MIRALAX AT BED TIMES AS NEEDED   3) CONTACT YOUR PULMONOLOGIST DR. Lamonte Sakai, SCHEDULE AN APPOINTMENT FOR A PULMONARY CLEARANCE PRIOR TO PROCEEDING WITH A COLONOSCOPY   We have you scheduled for your colonoscopy.

## 2022-08-25 ENCOUNTER — Encounter: Payer: Self-pay | Admitting: Emergency Medicine

## 2022-08-25 ENCOUNTER — Ambulatory Visit (INDEPENDENT_AMBULATORY_CARE_PROVIDER_SITE_OTHER): Payer: Medicare Other | Admitting: Emergency Medicine

## 2022-08-25 DIAGNOSIS — K219 Gastro-esophageal reflux disease without esophagitis: Secondary | ICD-10-CM | POA: Diagnosis not present

## 2022-08-25 DIAGNOSIS — J309 Allergic rhinitis, unspecified: Secondary | ICD-10-CM | POA: Diagnosis not present

## 2022-08-25 DIAGNOSIS — J849 Interstitial pulmonary disease, unspecified: Secondary | ICD-10-CM

## 2022-08-25 DIAGNOSIS — J449 Chronic obstructive pulmonary disease, unspecified: Secondary | ICD-10-CM | POA: Diagnosis not present

## 2022-08-25 MED ORDER — BUDESONIDE-FORMOTEROL FUMARATE 160-4.5 MCG/ACT IN AERO: 2.0000 | INHALATION_SPRAY | Freq: Two times a day (BID) | RESPIRATORY_TRACT | 12 refills | Status: DC

## 2022-08-25 MED ORDER — ALBUTEROL SULFATE HFA 108 (90 BASE) MCG/ACT IN AERS: 2.0000 | INHALATION_SPRAY | Freq: Four times a day (QID) | RESPIRATORY_TRACT | 6 refills | Status: DC | PRN

## 2022-08-25 NOTE — Progress Notes (Signed)
Subjective:    Patient ID: [name], female    DOB: 10-22-1946   MRN: [id number] HPI  ROV 10/14/21 --follow-up visit for 76 year old woman with a history of rheumatoid arthritis (for methotrexate), moderate persistent asthma and known upper airway irritation syndrome with vocal cord dysfunction.  Significant difficulty managing her chronic cough and stridor.  She been treated multiple times with prednisone since I last saw her including 1 month ago.  She is on Breztri - expensive. She uses albuterol nebs prn, not currently. Working on Public librarian for [employer] She is improved. Her cough has resolved, UA noise is better and voice is stronger.   High-resolution CT scan of the chest 10/04/2021 reviewed by me shows some subtle subpleural upper lobe reticulation without any honeycomb or bronchiectasis.  No overt scarring.  Pulmonary function testing performed today shows mild obstruction without a bronchodilator response, hyperinflated lung volumes, decreased diffusion capacity that corrects to normal range when adjusted for alveolar volume.  FEV1 is 87% predicted.  ROV 08/25/2022 --[name] is 76 and has a history of rheumatoid arthritis (former methotrexate) with some subpleural mild interstitial lung disease, probable moderate persistent asthma but with superimposed upper airway irritation syndrome, VCD and chronic cough.  Often difficult to tell whether she is having upper or lower airway symptoms.  She has been managed for a few years on Breztri but having a difficult time obtaining it due to cost - she doesn't have any inhalers right now. She used to be on symbicort.  On zyrtec, nasal steroid, singulair, PPI bid.    Objective:   Physical Exam Vitals:   08/25/22 1636  BP: 136/80  Pulse: 87  Temp: 98.4 F (36.9 C)  TempSrc: Oral  SpO2: 100%  Weight: 166 lb 6.4 oz (75.5 kg)  Height: '5\' 2"'$  (1.575 m)   Gen: Pleasant, overwt, in no distress,  normal affect  ENT: No  lesions,  mouth clear,  oropharynx clear, no postnasal drip, strong voice  Neck: supple, low pitched expiratory stridor  Lungs : distant, referred upper airway noise, otherwise clear  Cardiovascular: RRR, heart sounds normal, no murmur or gallops, no peripheral edema  Musculoskeletal: No deformities, no cyanosis or clubbing  Neuro: alert, non focal  Skin: Warm, no rash    Assessment & Plan:   Asthma-COPD overlap syndrome (HCC) Shirlene's asthma is actually mild based on her pulmonary function testing but her upper airway disease and vocal cord dysfunction can be debilitating.  She has dyspnea and upper airway noise with almost any exertion.  She has been off inhaled medication for over a month which probably is contributing some to shortness of breath.  We have been unable to get breztri so we will change her to Symbicort.  We will fill this as well as albuterol.  She is low to moderate risk for conscious sedation for her colonoscopy.  I would like for her to be back on the maintenance inhaler regimen before she gets her procedure done.  Discussed this with her today.  We will stop the [facility] since it has been difficult to obtain.  Please start Symbicort 160/4.5 mcg, 2 puffs twice a day.  Rinse and gargle after using. We will refill your albuterol.  Keep it available to use 2 puffs if needed for shortness of breath, chest tightness, wheezing. You are at mild to moderate increased risk for sedation required for your colonoscopy.  There is no contraindication to proceeding with a colonoscopy as long as the benefits out  weigh these risks.  I would like for you to be back on your maintenance inhaler medications before you have your colonoscopy done. Follow with Dr Lamonte Sakai in 6 months or sooner if you have any problems  ILD (interstitial lung disease) ([place]) Mild peripheral ILD in the setting of her rheumatoid arthritis.  Quite subtle.  We will follow her imaging intermittently.  Allergic  rhinitis Please continue your Zyrtec, fluticasone nasal spray, Singulair as you have been taking them.  Gastroesophageal reflux disease without esophagitis Continue PPI twice daily.  Her acid control has been key to trying to better manage her VCD and upper airway irritation syndrome.   Baltazar Apo, MD, PhD 08/25/2022, 5:09 PM [facility] [phone] or if no answer [phone]

## 2022-08-25 NOTE — Assessment & Plan Note (Signed)
[name]'s asthma is actually mild based on her pulmonary function testing but her upper airway disease and vocal cord dysfunction can be debilitating.  She has dyspnea and upper airway noise with almost any exertion.  She has been off inhaled medication for over a month which probably is contributing some to shortness of breath.  We have been unable to get breztri so we will change her to Symbicort.  We will fill this as well as albuterol.  She is low to moderate risk for conscious sedation for her colonoscopy.  I would like for her to be back on the maintenance inhaler regimen before she gets her procedure done.  Discussed this with her today.  We will stop the [facility] since it has been difficult to obtain.  Please start Symbicort 160/4.5 mcg, 2 puffs twice a day.  Rinse and gargle after using. We will refill your albuterol.  Keep it available to use 2 puffs if needed for shortness of breath, chest tightness, wheezing. You are at mild to moderate increased risk for sedation required for your colonoscopy.  There is no contraindication to proceeding with a colonoscopy as long as the benefits out weigh these risks.  I would like for you to be back on your maintenance inhaler medications before you have your colonoscopy done. Follow with Dr Lamonte Sakai in 6 months or sooner if you have any problems

## 2022-08-25 NOTE — Assessment & Plan Note (Signed)
Please continue your Zyrtec, fluticasone nasal spray, Singulair as you have been taking them.

## 2022-08-25 NOTE — Patient Instructions (Signed)
We will stop the [facility] since it has been difficult to obtain.  Please start Symbicort 160/4.5 mcg, 2 puffs twice a day.  Rinse and gargle after using. We will refill your albuterol.  Keep it available to use 2 puffs if needed for shortness of breath, chest tightness, wheezing. Please continue your Zyrtec, fluticasone nasal spray, Singulair as you have been taking them. Please continue your pantoprazole 40 mg twice a day.  Take this medication 1 hour around food. You are at mild to moderate increased risk for sedation required for your colonoscopy.  There is no contraindication to proceeding with a colonoscopy as long as the benefits out weigh these risks.  I would like for you to be back on your maintenance inhaler medications before you have your colonoscopy done. Follow with Dr Lamonte Sakai in 6 months or sooner if you have any problems

## 2022-08-25 NOTE — Assessment & Plan Note (Signed)
Mild peripheral ILD in the setting of her rheumatoid arthritis.  Quite subtle.  We will follow her imaging intermittently.

## 2022-08-25 NOTE — Assessment & Plan Note (Signed)
Continue PPI twice daily.  Her acid control has been key to trying to better manage her VCD and upper airway irritation syndrome.

## 2022-08-30 DIAGNOSIS — H1132 Conjunctival hemorrhage, left eye: Secondary | ICD-10-CM | POA: Diagnosis not present

## 2022-09-05 ENCOUNTER — Other Ambulatory Visit: Payer: Self-pay | Admitting: *Deleted

## 2022-09-05 DIAGNOSIS — J4489 Other specified chronic obstructive pulmonary disease: Secondary | ICD-10-CM

## 2022-09-05 MED ORDER — ALBUTEROL SULFATE HFA 108 (90 BASE) MCG/ACT IN AERS: 1.0000 | INHALATION_SPRAY | Freq: Four times a day (QID) | RESPIRATORY_TRACT | 3 refills | Status: DC | PRN

## 2022-09-08 ENCOUNTER — Encounter: Payer: Self-pay | Admitting: Internal Medicine

## 2022-09-08 ENCOUNTER — Ambulatory Visit (AMBULATORY_SURGERY_CENTER): Payer: Medicare Other | Admitting: Internal Medicine

## 2022-09-08 VITALS — BP 124/68 | HR 113 | Temp 97.5°F | Resp 19 | Ht 61.0 in | Wt 166.0 lb

## 2022-09-08 DIAGNOSIS — D125 Benign neoplasm of sigmoid colon: Secondary | ICD-10-CM | POA: Diagnosis not present

## 2022-09-08 DIAGNOSIS — D122 Benign neoplasm of ascending colon: Secondary | ICD-10-CM | POA: Diagnosis not present

## 2022-09-08 DIAGNOSIS — Z800 Family history of malignant neoplasm of digestive organs: Secondary | ICD-10-CM | POA: Diagnosis not present

## 2022-09-08 DIAGNOSIS — D123 Benign neoplasm of transverse colon: Secondary | ICD-10-CM

## 2022-09-08 DIAGNOSIS — Z09 Encounter for follow-up examination after completed treatment for conditions other than malignant neoplasm: Secondary | ICD-10-CM

## 2022-09-08 DIAGNOSIS — Z86010 Personal history of colonic polyps: Secondary | ICD-10-CM

## 2022-09-08 MED ORDER — SODIUM CHLORIDE 0.9 % IV SOLN: 500.0000 mL | Freq: Once | INTRAVENOUS | Status: DC

## 2022-09-08 NOTE — Progress Notes (Signed)
Called to room to assist during endoscopic procedure.  Patient ID and intended procedure confirmed with present staff. Received instructions for my participation in the procedure from the performing physician.  

## 2022-09-08 NOTE — Patient Instructions (Addendum)
Your repeat colonoscopy will depend on your biopsy results.  You probably will not have to have another.  Read all of the handouts given to you by your recovery room nurse.  YOU HAD AN ENDOSCOPIC PROCEDURE TODAY AT [facility]:   Refer to the procedure report that was given to you for any specific questions about what was found during the examination.  If the procedure report does not answer your questions, please call your gastroenterologist to clarify.  If you requested that your care partner not be given the details of your procedure findings, then the procedure report has been included in a sealed envelope for you to review at your convenience later.  YOU SHOULD EXPECT: Some feelings of bloating in the abdomen. Passage of more gas than usual.  Walking can help get rid of the air that was put into your GI tract during the procedure and reduce the bloating. If you had a lower endoscopy (such as a colonoscopy or flexible sigmoidoscopy) you may notice spotting of blood in your stool or on the toilet paper. If you underwent a bowel prep for your procedure, you may not have a normal bowel movement for a few days.  Please Note:  You might notice some irritation and congestion in your nose or some drainage.  This is from the oxygen used during your procedure.  There is no need for concern and it should clear up in a day or so.  SYMPTOMS TO REPORT IMMEDIATELY:  Following lower endoscopy (colonoscopy or flexible sigmoidoscopy):  Excessive amounts of blood in the stool  Significant tenderness or worsening of abdominal pains  Swelling of the abdomen that is new, acute  Fever of 100F or higher  For urgent or emergent issues, a gastroenterologist can be reached at any hour by calling [phone]. Do not use MyChart messaging for urgent concerns.    DIET:  We do recommend a small meal at first, but then you may proceed to your regular diet.  Drink plenty of fluids but you should avoid  alcoholic beverages for 24 hours.  ACTIVITY:  You should plan to take it easy for the rest of today and you should NOT DRIVE or use heavy machinery until tomorrow (because of the sedation medicines used during the test).    FOLLOW UP: Our staff will call the number listed on your records the next business day following your procedure.  We will call around 7:15- 8:00 am to check on you and address any questions or concerns that you may have regarding the information given to you following your procedure. If we do not reach you, we will leave a message.     If any biopsies were taken you will be contacted by phone or by letter within the next 1-3 weeks.  Please call us at [phone] if you have not heard about the biopsies in 3 weeks.    SIGNATURES/CONFIDENTIALITY: You and/or your care partner have signed paperwork which will be entered into your electronic medical record.  These signatures attest to the fact that that the information above on your After Visit Summary has been reviewed and is understood.  Full responsibility of the confidentiality of this discharge information lies with you and/or your care-partner.

## 2022-09-08 NOTE — Op Note (Signed)
[name] Patient Name: [name] Procedure Date: 09/08/2022 10:10 AM MRN: [id number] Endoscopist: Docia Chuck. [name] , MD Age: 76 Referring MD:  Date of Birth: January 14, 1946 Gender: Female Account #: [id number] Procedure:                Colonoscopy with cold snare polypectomy x 7; biopsy                            polypectomy x 2 Indications:              High risk colon cancer surveillance: Personal                            history of multiple (3 or more) adenomas. Also                            family history of colon cancer in her father around                            age 55. Previous examinations 1999, 2003, 2005,                            2008, 2013, 2017, 2022 (inadequate prep). Medicines:                Monitored Anesthesia Care Procedure:                Pre-Anesthesia Assessment:                           - Prior to the procedure, a History and Physical                            was performed, and patient medications and                            allergies were reviewed. The patient's tolerance of                            previous anesthesia was also reviewed. The risks                            and benefits of the procedure and the sedation                            options and risks were discussed with the patient.                            All questions were answered, and informed consent                            was obtained. Prior Anticoagulants: The patient has                            taken no previous anticoagulant or antiplatelet  agents. After reviewing the risks and benefits, the                            patient was deemed in satisfactory condition to                            undergo the procedure.                           After obtaining informed consent, the colonoscope                            was passed under direct vision. Throughout the                            procedure, the patient's blood  pressure, pulse, and                            oxygen saturations were monitored continuously. The                            CF [id number] #[id number] was introduced through the anus                            and advanced to the the cecum, identified by                            appendiceal orifice and ileocecal valve. The                            ileocecal valve, appendiceal orifice, and rectum                            were photographed. The quality of the bowel                            preparation was excellent. The colonoscopy was                            performed without difficulty. The patient tolerated                            the procedure well. The bowel preparation used was                            SUPREP via split dose instruction. Scope In: 10:22:59 AM Scope Out: 10:46:50 AM Scope Withdrawal Time: 0 hours 19 minutes 21 seconds  Total Procedure Duration: 0 hours 23 minutes 51 seconds  Findings:                 Seven polyps were found in the transverse colon and                            ascending colon. The polyps were 2 to 5 mm in  size.                            These polyps were removed with a cold snare.                            Resection and retrieval were complete.                           Two polyps were found in the ascending colon. The                            polyps were 1 mm in size. These polyps were removed                            with a jumbo cold forceps. Resection and retrieval                            were complete.                           The exam was otherwise without abnormality on                            direct and retroflexion views. Complications:            No immediate complications. Estimated blood loss:                            None. Estimated Blood Loss:     Estimated blood loss: none. Impression:               - Seven 2 to 5 mm polyps in the transverse colon                            and in the ascending colon, removed  with a cold                            snare. Resected and retrieved.                           - Two 1 mm polyps in the ascending colon, removed                            with a jumbo cold forceps. Resected and retrieved.                           - The examination was otherwise normal on direct                            and retroflexion views. Recommendation:           - Repeat colonoscopy is not recommended for                            surveillance.                           -  Patient has a contact number available for                            emergencies. The signs and symptoms of potential                            delayed complications were discussed with the                            patient. Return to normal activities tomorrow.                            Written discharge instructions were provided to the                            patient.                           - Resume previous diet.                           - Continue present medications.                           - Await pathology results. Docia Chuck. [name] Pastor, MD 09/08/2022 10:56:09 AM This report has been signed electronically.

## 2022-09-08 NOTE — Progress Notes (Signed)
HISTORY OF PRESENT ILLNESS:  [name] is a 76 y.o. female with a history of multiple adenomatous colon polyps and family history of colon cancer in her father around age 77.  Seen in the office July 28, 2022 regarding surveillance colonoscopy.  Last examination 2022 with inadequate prep.  REVIEW OF SYSTEMS:  All non-GI ROS negative. Past Medical History:  Diagnosis Date   Allergic rhinitis    Allergy    seasonal allergies   Anemia    hx of   Asthma    on meds   Cataract    bilateral sx    Chronic bronchitis (HCC)    Colonic polyp    DDD (degenerative disc disease), lumbar 07/20/2011   DJD (degenerative joint disease) of knee    bilateral   DJD (degenerative joint disease), lumbar 07/20/2011   Esophageal stricture    GERD (gastroesophageal reflux disease)    on meds   Hiatal hernia    Hyperlipidemia    on meds   Osteoporosis    Stroke [facility]) 2017   "didn't know I'd had one before I was told; didn't do any damage" (11/24/2016)   Vocal cord dysfunction     Past Surgical History:  Procedure Laterality Date   CARPAL TUNNEL RELEASE Bilateral    COLONOSCOPY  2017   JP-suprep(exc)=TA x 2   COLONOSCOPY W/ BIOPSIES AND POLYPECTOMY     ESOPHAGOGASTRODUODENOSCOPY N/A 02/03/2022   Procedure: ESOPHAGOGASTRODUODENOSCOPY (EGD);  Surgeon: Milus Banister, MD;  Location: [facility];  Service: Endoscopy;  Laterality: N/A;   ESOPHAGOGASTRODUODENOSCOPY (EGD) WITH ESOPHAGEAL DILATION     EUS N/A 02/03/2022   Procedure: UPPER ENDOSCOPIC ULTRASOUND (EUS) RADIAL;  Surgeon: Milus Banister, MD;  Location: [facility];  Service: Endoscopy;  Laterality: N/A;   FOOT SURGERY Bilateral    "might have been bunions or hammertoes"   [facility]COPY Left 11/06/2013   POLYPECTOMY  2017   TA x 2   TONSILLECTOMY     TOTAL ABDOMINAL HYSTERECTOMY     UPPER GASTROINTESTINAL ENDOSCOPY  01/20/2022   VIDEO BRONCHOSCOPY Bilateral 12/12/2014   Procedure: VIDEO BRONCHOSCOPY  WITHOUT FLUORO;  Surgeon: Chesley Mires, MD;  Location: [facility];  Service: Cardiopulmonary;  Laterality: Bilateral;   WISDOM TOOTH EXTRACTION      Social History [employer]  reports that she quit smoking about 27 years ago. Her smoking use included cigarettes. She started smoking about 47 years ago. She has a 3.00 pack-year smoking history. She has never used smokeless tobacco. She reports current alcohol use. She reports that she does not use drugs.  family history includes Asthma in her brother and another family member; Colon cancer (age of onset: 35) in her sister; Colon cancer (age of onset: 42) in her father; Colon polyps (age of onset: 71) in her sister; Colon polyps (age of onset: 43) in her daughter; Colon polyps (age of onset: 66) in her father; Heart attack in her mother.  Allergies  Allergen Reactions   Statins Other (See Comments)    Leg cramps   Penicillins Itching and Rash    Did it involve swelling of the face/tongue/throat, SOB, or low BP? N Did it involve sudden or severe rash/hives, skin peeling, or any reaction on the inside of your mouth or nose? Y Did you need to seek medical attention at a hospital or doctor's office? N When did it last happen? Decades Ago      If all above answers are "  NO", may proceed with cephalosporin use.     Pravastatin Other (See Comments)    leg cramps       PHYSICAL EXAMINATION: Vital signs: BP 133/73   Pulse 86   Temp (!) 97.5 F (36.4 C) (Skin)   Ht '5\' 1"'$  (1.549 m)   Wt 166 lb (75.3 kg)   SpO2 99%   BMI 31.37 kg/m  General: Well-developed, well-nourished, no acute distress HEENT: Sclerae are anicteric, conjunctiva pink. Oral mucosa intact Lungs: Expiratory wheezing but moving air well Heart: Regular Abdomen: soft, nontender, nondistended, no obvious ascites, no peritoneal signs, normal bowel sounds. No organomegaly. Extremities: No edema Psychiatric: alert and oriented x3. Cooperative       ASSESSMENT:  Personal history of adenomatous colon polyps and family history of colon cancer   PLAN:  Surveillance colonoscopy

## 2022-09-08 NOTE — Progress Notes (Signed)
Pt's states no medical or surgical changes since previsit or office visit. VS assessed by D.T 

## 2022-09-08 NOTE — Progress Notes (Signed)
Sedate, gd SR, tolerated procedure well, VSS, report to RN 

## 2022-09-09 ENCOUNTER — Telehealth: Payer: Self-pay | Admitting: *Deleted

## 2022-09-09 NOTE — Telephone Encounter (Signed)
  Follow up Call-     09/08/2022    9:26 AM 01/20/2022   11:06 AM 05/12/2021    7:13 AM  Call back number  Post procedure Call Back phone  # [phone]  Permission to leave phone message Yes Yes Yes     Patient questions:  Do you have a fever, pain , or abdominal swelling? No. Pain Score  0 *  Have you tolerated food without any problems? Yes.    Have you been able to return to your normal activities? Yes.    Do you have any questions about your discharge instructions: Diet   No. Medications  No. Follow up visit  No.  Do you have questions or concerns about your Care? No.  Actions: * If pain score is 4 or above: No action needed, pain <4.

## 2022-09-12 ENCOUNTER — Other Ambulatory Visit: Payer: Self-pay

## 2022-09-12 DIAGNOSIS — D473 Essential (hemorrhagic) thrombocythemia: Secondary | ICD-10-CM

## 2022-09-12 DIAGNOSIS — Z1589 Genetic susceptibility to other disease: Secondary | ICD-10-CM

## 2022-09-12 DIAGNOSIS — D573 Sickle-cell trait: Secondary | ICD-10-CM

## 2022-09-12 MED ORDER — HYDROXYUREA 500 MG PO CAPS: 500.0000 mg | ORAL_CAPSULE | Freq: Three times a day (TID) | ORAL | 6 refills | Status: DC

## 2022-09-13 ENCOUNTER — Encounter: Payer: Self-pay | Admitting: Internal Medicine

## 2022-09-22 ENCOUNTER — Encounter (HOSPITAL_COMMUNITY): Payer: Self-pay

## 2022-09-22 ENCOUNTER — Ambulatory Visit (INDEPENDENT_AMBULATORY_CARE_PROVIDER_SITE_OTHER): Payer: Medicare Other

## 2022-09-22 ENCOUNTER — Ambulatory Visit (HOSPITAL_COMMUNITY)
Admission: EM | Admit: 2022-09-22 | Discharge: 2022-09-22 | Disposition: A | Discharge status: Home / Self Care | Payer: Medicare Other | Attending: Family Medicine | Admitting: Family Medicine

## 2022-09-22 DIAGNOSIS — J4541 Moderate persistent asthma with (acute) exacerbation: Secondary | ICD-10-CM | POA: Diagnosis not present

## 2022-09-22 DIAGNOSIS — R0602 Shortness of breath: Secondary | ICD-10-CM

## 2022-09-22 DIAGNOSIS — K449 Diaphragmatic hernia without obstruction or gangrene: Secondary | ICD-10-CM | POA: Diagnosis not present

## 2022-09-22 MED ORDER — METHYLPREDNISOLONE ACETATE 80 MG/ML IJ SUSP
INTRAMUSCULAR | Status: AC
  Filled 2022-09-22: qty 1

## 2022-09-22 MED ORDER — METHYLPREDNISOLONE ACETATE 80 MG/ML IJ SUSP
80.0000 mg | Freq: Once | INTRAMUSCULAR | Status: AC
  Administered 2022-09-22: 80 mg via INTRAMUSCULAR

## 2022-09-22 MED ORDER — PREDNISONE 20 MG PO TABS: 40.0000 mg | ORAL_TABLET | Freq: Every day | ORAL | 0 refills | Status: AC

## 2022-09-22 MED ORDER — IPRATROPIUM-ALBUTEROL 0.5-2.5 (3) MG/3ML IN SOLN
RESPIRATORY_TRACT | Status: AC
  Filled 2022-09-22: qty 3

## 2022-09-22 MED ORDER — IPRATROPIUM-ALBUTEROL 0.5-2.5 (3) MG/3ML IN SOLN
3.0000 mL | Freq: Once | RESPIRATORY_TRACT | Status: AC
  Administered 2022-09-22: 3 mL via RESPIRATORY_TRACT

## 2022-09-22 NOTE — ED Provider Notes (Signed)
[place]    CSN: [id number] Arrival date & time: 09/22/22  1504      History   Chief Complaint No chief complaint on file.   HPI [name] is a 76 y.o. female.   HPI Here for increased wheezing and shortness of breath for about 2 weeks.  It then got worse in the last 24 hours.  No recent fever and no nasal congestion.  No throwing up or diarrhea.  She last used her albuterol earlier today about 4 to 5 hours ago, and did not help as much as usual.  She uses a Symbicort inhaler 2 times daily  No history of diabetes  Past Medical History:  Diagnosis Date   Allergic rhinitis    Allergy    seasonal allergies   Anemia    hx of   Asthma    on meds   Cataract    bilateral sx    Chronic bronchitis (HCC)    Colonic polyp    DDD (degenerative disc disease), lumbar 07/20/2011   DJD (degenerative joint disease) of knee    bilateral   DJD (degenerative joint disease), lumbar 07/20/2011   Esophageal stricture    GERD (gastroesophageal reflux disease)    on meds   Hiatal hernia    Hyperlipidemia    on meds   Osteoporosis    Stroke [facility]) 2017   "didn't know I'd had one before I was told; didn't do any damage" (11/24/2016)   Vocal cord dysfunction     Patient Active Problem List   Diagnosis Date Noted   Abnormal gait 01/12/2022   Carpal tunnel syndrome 01/12/2022   Essential tremor 01/12/2022   Skin sensation disturbance 01/12/2022   History of stroke 09/04/2021   Candidiasis 09/01/2021   Chronic kidney disease, stage 2 (mild) 08/12/2021   Centrilobular emphysema ([place]) 04/13/2021   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   Cramp and spasm 03/01/2021   Cramp in lower leg associated with rest 03/01/2021   Hardening of the aorta (main artery of the heart) ([place]) 03/01/2021   Leukopenia 03/01/2021   Menopause 03/01/2021   Multiple joint pain 03/01/2021   Obesity 03/01/2021   Rheumatoid arthritis ([place]) 03/01/2021   Severe major depression,  single episode, without psychotic features ([place]) 03/01/2021   Hyperglycemia 02/21/2021   Asthma-COPD overlap syndrome 07/08/2020   On methotrexate therapy 07/10/2018   Abnormal laboratory test 03/29/2018   Sickle cell trait ([place]) 01/20/2017   JAK-2 gene mutation 12/06/2016   AKI (acute kidney injury) ([place]) 11/24/2016   Right shoulder pain 11/24/2016   Acute respiratory distress 11/24/2016   RLS (restless legs syndrome) 11/24/2016   Restless leg syndrome 10/13/2016   Primary osteoarthritis of left knee 02/18/2015   Shingles 12/19/2014   ILD (interstitial lung disease) ([place])    Essential thrombocythemia ([place]) 12/24/2013   S/P left knee arthroscopy 11/11/2013   TIA (transient ischemic attack) 09/01/2012   Chest pain 09/01/2012   Arm numbness left 06/03/2012   Migraine 05/31/2012   Dyspnea on exertion 08/09/2011   DDD (degenerative disc disease), lumbar 07/20/2011   DJD (degenerative joint disease), lumbar 07/20/2011   CVA (cerebral infarction) 07/20/2011   Syncope and collapse 07/12/2011   Acute lumbar radiculopathy 07/12/2011   Encounter for general adult medical examination without abnormal findings 07/10/2011   TRIGGER FINGER, RIGHT MIDDLE 01/11/2011   Other dysphagia 01/11/2011   MENOPAUSAL DISORDER 07/05/2010   Osteoarthritis 07/05/2010   NEVUS 05/20/2010   VAGINITIS 03/23/2010   SUPERFICIAL THROMBOPHLEBITIS 03/20/2009  FATIGUE 03/20/2009   DYSPHAGIA UNSPECIFIED 02/16/2009   Snoring 11/05/2008   HERPES ZOSTER 06/30/2008   Diabetes ([place]) 06/30/2008   Mixed hyperlipidemia 06/30/2008   ESOPHAGEAL STRICTURE 06/30/2008   FOOT PAIN, RIGHT 06/30/2008   OSTEOPOROSIS 06/30/2008   COLONIC POLYPS, HX OF 06/30/2008   PANIC ATTACK 09/05/2007   Allergic rhinitis 09/05/2007   Vocal cord dysfunction 09/05/2007   Gastroesophageal reflux disease without esophagitis 09/05/2007    Past Surgical History:  Procedure Laterality Date   CARPAL TUNNEL RELEASE Bilateral    COLONOSCOPY   2017   JP-suprep(exc)=TA x 2   COLONOSCOPY W/ BIOPSIES AND POLYPECTOMY     ESOPHAGOGASTRODUODENOSCOPY N/A 02/03/2022   Procedure: ESOPHAGOGASTRODUODENOSCOPY (EGD);  Surgeon: Milus Letecia Arps, MD;  Location: [facility];  Service: Endoscopy;  Laterality: N/A;   ESOPHAGOGASTRODUODENOSCOPY (EGD) WITH ESOPHAGEAL DILATION     EUS N/A 02/03/2022   Procedure: UPPER ENDOSCOPIC ULTRASOUND (EUS) RADIAL;  Surgeon: Milus Addilynne Olheiser, MD;  Location: [facility];  Service: Endoscopy;  Laterality: N/A;   FOOT SURGERY Bilateral    "might have been bunions or hammertoes"   [place] ARTHROSCOPY Left 11/06/2013   POLYPECTOMY  2017   TA x 2   TONSILLECTOMY     TOTAL ABDOMINAL HYSTERECTOMY     UPPER GASTROINTESTINAL ENDOSCOPY  01/20/2022   VIDEO BRONCHOSCOPY Bilateral 12/12/2014   Procedure: VIDEO BRONCHOSCOPY WITHOUT FLUORO;  Surgeon: Chesley Mires, MD;  Location: [place];  Service: Cardiopulmonary;  Laterality: Bilateral;   WISDOM TOOTH EXTRACTION      OB History   No obstetric history on file.      Home Medications    Prior to Admission medications   Medication Sig Start Date End Date Taking? Authorizing Provider  predniSONE (DELTASONE) 20 MG tablet Take 2 tablets (40 mg total) by mouth daily with breakfast for 5 days. 09/22/22 09/27/22 Yes Andie Mortimer, Gwenlyn Perking, MD  acetaminophen (TYLENOL) 500 MG tablet Take 500-1,000 mg by mouth every 6 (six) hours as needed (pain.).    [provider]  albuterol (PROVENTIL HFA) 108 (90 Base) MCG/ACT inhaler Inhale 1-2 puffs into the lungs every 6 (six) hours as needed for wheezing or shortness of breath. 09/05/22   Collene Gobble, MD  AMBULATORY NON FORMULARY MEDICATION Medication Name: oral iron once daily    [provider]  aspirin EC 81 MG tablet Take 81 mg by mouth in the morning.    [provider]  budesonide-formoterol (SYMBICORT) 160-4.5 MCG/ACT inhaler Inhale 2 puffs into the lungs in the morning and at  bedtime. 08/25/22   Collene Gobble, MD  CALCIUM-CHOLECALCIFEROL PO Take 1 tablet by mouth in the morning.    [provider]  cetirizine (ZYRTEC ALLERGY) 10 MG tablet Take 1 tablet (10 mg total) by mouth daily. 12/14/21   Cobb, Karie Schwalbe, NP  cholecalciferol (VITAMIN D3) 25 MCG (1000 UNIT) tablet Take 1,000 Units by mouth daily.    [provider]  fluticasone (FLONASE) 50 MCG/ACT nasal spray Place 1 spray into the nose 2 (two) times daily.    [provider]  folic acid (FOLVITE) 542 MCG tablet Take 400 mcg by mouth in the morning.    [provider]  gabapentin (NEURONTIN) 100 MG capsule Take 100 mg by mouth at bedtime. 08/27/20   [provider]  hydroxyurea (HYDREA) 500 MG capsule Take 1 capsule (500 mg total) by mouth 3 (three) times daily before meals. 09/12/22   [name], MD  magnesium  oxide (MAG-OX) 400 MG tablet Take 400 mg by mouth in the morning.    [provider]  methotrexate (RHEUMATREX) 2.5 MG tablet 6 tabs Orally weekly for 90    [provider]  montelukast (SINGULAIR) 10 MG tablet Take 1 tablet (10 mg total) by mouth at bedtime. 03/11/21   Collene Gobble, MD  montelukast (SINGULAIR) 10 MG tablet 1 tablet Orally Once a day for 30 day(s)    [provider]  Multiple Vitamins-Minerals (CENTRUM SILVER 50+WOMEN PO) Take 1 tablet by mouth in the morning.    [provider]  pantoprazole (PROTONIX) 20 MG tablet Take 20 mg by mouth 2 (two) times daily. 11/04/21   [provider]  rosuvastatin (CRESTOR) 10 MG tablet Take 10 mg by mouth at bedtime. 02/13/21   [provider]  vitamin B-12 (CYANOCOBALAMIN) 1000 MCG tablet Take 1,000 mcg by mouth in the morning and at bedtime.    [provider]    Family History Family History  Problem Relation Age of Onset   Heart attack Mother    Colon cancer Father 8   Colon polyps Father 54   Colon polyps Sister 21   Colon cancer  Sister 63   Asthma Brother    Asthma Other        uncle   Colon polyps Daughter 43   Stomach cancer Neg Hx    Esophageal cancer Neg Hx    Rectal cancer Neg Hx     Social History Social History   Tobacco Use   Smoking status: Former    Packs/day: 0.10    Years: 30.00    Total pack years: 3.00    Types: Cigarettes    Start date: 01/31/1975    Quit date: 08/09/1995    Years since quitting: 27.1   Smokeless tobacco: Never  Vaping Use   Vaping Use: Never used  Substance Use Topics   Alcohol use: Yes    Alcohol/week: 0.0 standard drinks of alcohol    Comment: once per 3 months   Drug use: No     Allergies   Statins, Penicillins, and Pravastatin   Review of Systems Review of Systems   Physical Exam Triage Vital Signs ED Triage Vitals [09/22/22 1511]  Enc Vitals Group     BP (!) 171/83     Pulse Rate 87     Resp 18     Temp 98.1 F (36.7 C)     Temp Source Oral     SpO2 98 %     Weight      Height      Head Circumference      Peak Flow      Pain Score      Pain Loc      Pain Edu?      Excl. in [place]?    No data found.  Updated Vital Signs BP (!) 171/83 (BP Location: Left Arm)   Pulse 87   Temp 98.1 F (36.7 C) (Oral)   Resp 18   SpO2 98%   Visual Acuity Right Eye Distance:   Left Eye Distance:   Bilateral Distance:    Right Eye Near:   Left Eye Near:    Bilateral Near:     Physical Exam Vitals reviewed.  Constitutional:      General: She is not in acute distress.    Appearance: She is not toxic-appearing.  HENT:     Right Ear: Tympanic membrane and ear canal  normal.     Left Ear: Tympanic membrane and ear canal normal.     Nose: Nose normal.     Mouth/Throat:     Mouth: Mucous membranes are moist.     Pharynx: No oropharyngeal exudate or posterior oropharyngeal erythema.  Eyes:     Extraocular Movements: Extraocular movements intact.     Conjunctiva/sclera: Conjunctivae normal.     Pupils: Pupils are equal, round, and reactive to light.   Cardiovascular:     Rate and Rhythm: Normal rate and regular rhythm.     Heart sounds: No murmur heard. Pulmonary:     Breath sounds: No stridor. No rhonchi or rales.     Comments: She has expiratory wheezes with prolonged expiratory phase and reduced air movement.  She is able to speak 4-5 words at a time Chest:     Chest wall: No tenderness.  Musculoskeletal:     Cervical back: Neck supple.  Lymphadenopathy:     Cervical: No cervical adenopathy.  Skin:    Capillary Refill: Capillary refill takes less than 2 seconds.     Coloration: Skin is not jaundiced or pale.  Neurological:     General: No focal deficit present.     Mental Status: She is alert and oriented to person, place, and time.  Psychiatric:        Behavior: Behavior normal.      UC Treatments / Results  Labs (all labs ordered are listed, but only abnormal results are displayed) Labs Reviewed - No data to display  EKG   Radiology DG Chest 2 View  Result Date: 09/22/2022 CLINICAL DATA:  Shortness of breath. EXAM: CHEST - 2 VIEW COMPARISON:  September 03, 2021. FINDINGS: The heart size and mediastinal contours are within normal limits. Both lungs are clear. Small hiatal hernia is noted. The visualized skeletal structures are unremarkable. IMPRESSION: No active cardiopulmonary disease.  Small hiatal hernia. Electronically Signed   By: Marijo Conception M.D.   On: 09/22/2022 16:27    Procedures Procedures (including critical care time)  Medications Ordered in UC Medications  ipratropium-albuterol (DUONEB) 0.5-2.5 (3) MG/3ML nebulizer solution 3 mL (3 mLs Nebulization Given 09/22/22 1540)  methylPREDNISolone acetate (DEPO-MEDROL) injection 80 mg (80 mg Intramuscular Given 09/22/22 1540)    Initial Impression / Assessment and Plan / UC Course  I have reviewed the triage vital signs and the nursing notes.  Pertinent labs & imaging results that were available during my care of the patient were reviewed by me and  considered in my medical decision making (see chart for details).       After the DuoNeb treatment, she states she feels a little improved.  On exam she is still having inspiratory and expiratory wheezes, though her air movement is actually better.  Chest x-ray is clear.  I reexamined again and she is moving air better though she has expiratory wheezes.  O2 sat on room air is 92 to 93%  We discussed options.  I am going to send her home with a prescription of prednisone to begin tonight.  She has already had Depo-Medrol here.  If she has any worsening again she is to present to the emergency room  Final Clinical Impressions(s) / UC Diagnoses   Final diagnoses:  Moderate persistent asthma with (acute) exacerbation     Discharge Instructions      You were given a breathing treatment with albuterol and ipratropium.  You were given a shot of Depo-Medrol 80 mg  Take  prednisone 20 mg--2 daily for 5 days  Continue to use your albuterol as needed  If you begin to feel any worse again or your albuterol is not helping you, please proceed to the emergency room for further evaluation and treatment     ED Prescriptions     Medication Sig Dispense Auth. Provider   predniSONE (DELTASONE) 20 MG tablet Take 2 tablets (40 mg total) by mouth daily with breakfast for 5 days. 10 tablet Windy Carina Gwenlyn Perking, MD      PDMP not reviewed this encounter.   Barrett Henle, MD 09/22/22 [phone]

## 2022-09-22 NOTE — ED Triage Notes (Signed)
Pt reports SOB with wheezing started a couple weeks ago. Pt reports she use her Symbicort early this afternoon.

## 2022-09-22 NOTE — Discharge Instructions (Addendum)
You were given a breathing treatment with albuterol and ipratropium.  You were given a shot of Depo-Medrol 80 mg  Take prednisone 20 mg--2 daily for 5 days  Continue to use your albuterol as needed  If you begin to feel any worse again or your albuterol is not helping you, please proceed to the emergency room for further evaluation and treatment

## 2022-09-23 ENCOUNTER — Encounter (HOSPITAL_COMMUNITY): Payer: Self-pay

## 2022-09-23 ENCOUNTER — Emergency Department (HOSPITAL_COMMUNITY)
Admission: EM | Admit: 2022-09-23 | Discharge: 2022-09-23 | Disposition: A | Discharge status: Home / Self Care | Payer: Medicare Other | Attending: Emergency Medicine | Admitting: Emergency Medicine

## 2022-09-23 ENCOUNTER — Ambulatory Visit: Payer: Medicare Other | Admitting: Internal Medicine

## 2022-09-23 ENCOUNTER — Other Ambulatory Visit: Payer: Self-pay

## 2022-09-23 ENCOUNTER — Emergency Department (HOSPITAL_COMMUNITY): Payer: Medicare Other

## 2022-09-23 DIAGNOSIS — Z743 Need for continuous supervision: Secondary | ICD-10-CM | POA: Diagnosis not present

## 2022-09-23 DIAGNOSIS — J4521 Mild intermittent asthma with (acute) exacerbation: Secondary | ICD-10-CM | POA: Insufficient documentation

## 2022-09-23 DIAGNOSIS — Z7952 Long term (current) use of systemic steroids: Secondary | ICD-10-CM | POA: Insufficient documentation

## 2022-09-23 DIAGNOSIS — Z7982 Long term (current) use of aspirin: Secondary | ICD-10-CM | POA: Diagnosis not present

## 2022-09-23 DIAGNOSIS — Z7951 Long term (current) use of inhaled steroids: Secondary | ICD-10-CM | POA: Diagnosis not present

## 2022-09-23 DIAGNOSIS — R0602 Shortness of breath: Secondary | ICD-10-CM | POA: Diagnosis not present

## 2022-09-23 DIAGNOSIS — R062 Wheezing: Secondary | ICD-10-CM | POA: Diagnosis not present

## 2022-09-23 LAB — CBC WITH DIFFERENTIAL/PLATELET
Abs Immature Granulocytes: 0.09 10*3/uL — ABNORMAL HIGH (ref 0.00–0.07)
Basophils Absolute: 0 10*3/uL (ref 0.0–0.1)
Basophils Relative: 0 %
Eosinophils Absolute: 0 10*3/uL (ref 0.0–0.5)
Eosinophils Relative: 0 %
HCT: 39.7 % (ref 36.0–46.0)
Hemoglobin: 12.6 g/dL (ref 12.0–15.0)
Immature Granulocytes: 1 %
Lymphocytes Relative: 11 %
Lymphs Abs: 1.3 10*3/uL (ref 0.7–4.0)
MCH: 25.9 pg — ABNORMAL LOW (ref 26.0–34.0)
MCHC: 31.7 g/dL (ref 30.0–36.0)
MCV: 81.5 fL (ref 80.0–100.0)
Monocytes Absolute: 0.9 10*3/uL (ref 0.1–1.0)
Monocytes Relative: 7 %
Neutro Abs: 9.6 10*3/uL — ABNORMAL HIGH (ref 1.7–7.7)
Neutrophils Relative %: 81 %
Platelets: 567 10*3/uL — ABNORMAL HIGH (ref 150–400)
RBC: 4.87 MIL/uL (ref 3.87–5.11)
RDW: 15.9 % — ABNORMAL HIGH (ref 11.5–15.5)
WBC: 11.9 10*3/uL — ABNORMAL HIGH (ref 4.0–10.5)
nRBC: 0 % (ref 0.0–0.2)

## 2022-09-23 LAB — COMPREHENSIVE METABOLIC PANEL
ALT: 14 U/L (ref 0–44)
AST: 19 U/L (ref 15–41)
Albumin: 3.3 g/dL — ABNORMAL LOW (ref 3.5–5.0)
Alkaline Phosphatase: 64 U/L (ref 38–126)
Anion gap: 7 (ref 5–15)
BUN: 10 mg/dL (ref 8–23)
CO2: 23 mmol/L (ref 22–32)
Calcium: 8.7 mg/dL — ABNORMAL LOW (ref 8.9–10.3)
Chloride: 105 mmol/L (ref 98–111)
Creatinine, Ser: 0.77 mg/dL (ref 0.44–1.00)
GFR, Estimated: >60 mL/min (ref >60)
Glucose, Bld: 124 mg/dL — ABNORMAL HIGH (ref 70–99)
Potassium: 3.4 mmol/L — ABNORMAL LOW (ref 3.5–5.1)
Sodium: 135 mmol/L (ref 135–145)
Total Bilirubin: 0.7 mg/dL (ref 0.3–1.2)
Total Protein: 6.3 g/dL — ABNORMAL LOW (ref 6.5–8.1)

## 2022-09-23 LAB — BRAIN NATRIURETIC PEPTIDE: B Natriuretic Peptide: 42.4 pg/mL (ref 0.0–100.0)

## 2022-09-23 MED ORDER — FLUCONAZOLE 150 MG PO TABS: ORAL_TABLET | ORAL | 0 refills | Status: DC

## 2022-09-23 MED ORDER — METHYLPREDNISOLONE SODIUM SUCC 125 MG IJ SOLR: 125.0000 mg | Freq: Once | INTRAMUSCULAR | Status: DC

## 2022-09-23 MED ORDER — IPRATROPIUM-ALBUTEROL 0.5-2.5 (3) MG/3ML IN SOLN
3.0000 mL | Freq: Once | RESPIRATORY_TRACT | Status: AC
  Administered 2022-09-23: 3 mL via RESPIRATORY_TRACT
  Filled 2022-09-23: qty 3

## 2022-09-23 MED ORDER — MAGNESIUM SULFATE 2 GM/50ML IV SOLN: 2.0000 g | Freq: Once | INTRAVENOUS | Status: DC

## 2022-09-23 MED ORDER — DOXYCYCLINE HYCLATE 100 MG PO CAPS: 100.0000 mg | ORAL_CAPSULE | Freq: Two times a day (BID) | ORAL | 0 refills | Status: DC

## 2022-09-23 MED ORDER — ALBUTEROL SULFATE (2.5 MG/3ML) 0.083% IN NEBU
2.5000 mg | INHALATION_SOLUTION | Freq: Once | RESPIRATORY_TRACT | Status: AC
  Administered 2022-09-23: 2.5 mg via RESPIRATORY_TRACT
  Filled 2022-09-23: qty 3

## 2022-09-23 MED ORDER — IPRATROPIUM BROMIDE HFA 17 MCG/ACT IN AERS: 2.0000 | INHALATION_SPRAY | Freq: Four times a day (QID) | RESPIRATORY_TRACT | 12 refills | Status: AC | PRN

## 2022-09-23 NOTE — ED Triage Notes (Signed)
BIBA with c/o shob x1 day with labored breathing and wheezing in all lobes per EMS. Hx asthma with neb at home with no relief.  Ems admin 5 albuterol, 0.5 Atrovent, 125 soul-medrol, and 2 mag

## 2022-09-23 NOTE — Discharge Instructions (Signed)
Continue using your inhalers and nebulizer along with your prednisone.  I have added another inhaler and also gave you some antibiotics to take.  Follow-up next week with your doctor.  Return here sooner if getting worse

## 2022-09-23 NOTE — ED Provider Notes (Signed)
[facility] DEPT Provider Note   CSN: [id number] Arrival date & time: 09/23/22  1004     History {Add pertinent medical, surgical, social history, OB history to HPI:1} Chief Complaint  Patient presents with   Shortness of Breath    [name] is a 76 y.o. female.  Patient has a history of asthma.  She came in complaining of shortness of breath.   Shortness of Breath      Home Medications Prior to Admission medications   Medication Sig Start Date End Date Taking? Authorizing Provider  doxycycline (VIBRAMYCIN) 100 MG capsule Take 1 capsule (100 mg total) by mouth 2 (two) times daily. One po bid x 7 days 09/23/22  Yes Milton Ferguson, MD  fluconazole (DIFLUCAN) 150 MG tablet Take this pill if you develop a yeast infection 09/23/22  Yes Milton Ferguson, MD  ipratropium (ATROVENT HFA) 17 MCG/ACT inhaler Inhale 2 puffs into the lungs every 6 (six) hours as needed for wheezing. 09/23/22  Yes Milton Ferguson, MD  acetaminophen (TYLENOL) 500 MG tablet Take 500-1,000 mg by mouth every 6 (six) hours as needed (pain.).    [provider]  albuterol (PROVENTIL HFA) 108 (90 Base) MCG/ACT inhaler Inhale 1-2 puffs into the lungs every 6 (six) hours as needed for wheezing or shortness of breath. 09/05/22   Collene Gobble, MD  AMBULATORY NON FORMULARY MEDICATION Medication Name: oral iron once daily    [provider]  aspirin EC 81 MG tablet Take 81 mg by mouth in the morning.    [provider]  budesonide-formoterol (SYMBICORT) 160-4.5 MCG/ACT inhaler Inhale 2 puffs into the lungs in the morning and at bedtime. 08/25/22   Collene Gobble, MD  CALCIUM-CHOLECALCIFEROL PO Take 1 tablet by mouth in the morning.    [provider]  cetirizine (ZYRTEC ALLERGY) 10 MG tablet Take 1 tablet (10 mg total) by mouth daily. 12/14/21   Cobb, Karie Schwalbe, NP  cholecalciferol (VITAMIN D3) 25 MCG (1000 UNIT) tablet Take 1,000 Units by mouth  daily.    [provider]  fluticasone (FLONASE) 50 MCG/ACT nasal spray Place 1 spray into the nose 2 (two) times daily.    [provider]  folic acid (FOLVITE) 315 MCG tablet Take 400 mcg by mouth in the morning.    [provider]  gabapentin (NEURONTIN) 100 MG capsule Take 100 mg by mouth at bedtime. 08/27/20   [provider]  hydroxyurea (HYDREA) 500 MG capsule Take 1 capsule (500 mg total) by mouth 3 (three) times daily before meals. 09/12/22   [name], MD  magnesium oxide (MAG-OX) 400 MG tablet Take 400 mg by mouth in the morning.    [provider]  methotrexate (RHEUMATREX) 2.5 MG tablet 6 tabs Orally weekly for 90    [provider]  montelukast (SINGULAIR) 10 MG tablet Take 1 tablet (10 mg total) by mouth at bedtime. 03/11/21   Collene Gobble, MD  montelukast (SINGULAIR) 10 MG tablet 1 tablet Orally Once a day for 30 day(s)    [provider]  Multiple Vitamins-Minerals (CENTRUM SILVER 50+WOMEN PO) Take 1 tablet by mouth in the morning.    [provider]  pantoprazole (PROTONIX) 20 MG tablet Take 20 mg by mouth 2 (two) times daily. 11/04/21   [provider]  predniSONE (DELTASONE) 20 MG tablet Take 2 tablets (40 mg total) by mouth daily with breakfast for 5 days. 09/22/22 09/27/22  Barrett Henle, MD  rosuvastatin (CRESTOR) 10 MG tablet Take 10 mg by mouth at bedtime. 02/13/21   [provider]  vitamin B-12 (CYANOCOBALAMIN) 1000 MCG tablet Take 1,000 mcg by mouth in the morning and at bedtime.    [provider]      Allergies    Statins, Penicillins, and Pravastatin    Review of Systems   Review of Systems  Respiratory:  Positive for shortness of breath.     Physical Exam Updated Vital Signs BP 133/78   Pulse 96   Temp 98.5 F (36.9 C)   Resp 11   SpO2 100%  Physical Exam  ED Results / Procedures / Treatments   Labs (all labs ordered are listed, but only  abnormal results are displayed) Labs Reviewed  CBC WITH DIFFERENTIAL/PLATELET - Abnormal; Notable for the following components:      Result Value   WBC 11.9 (*)    MCH 25.9 (*)    RDW 15.9 (*)    Platelets 567 (*)    Neutro Abs 9.6 (*)    Abs Immature Granulocytes 0.09 (*)    All other components within normal limits  COMPREHENSIVE METABOLIC PANEL - Abnormal; Notable for the following components:   Potassium 3.4 (*)    Glucose, Bld 124 (*)    Calcium 8.7 (*)    Total Protein 6.3 (*)    Albumin 3.3 (*)    All other components within normal limits  BRAIN NATRIURETIC PEPTIDE    EKG None  Radiology DG Chest Port 1 View  Result Date: 09/23/2022 CLINICAL DATA:  Shortness of breath EXAM: PORTABLE CHEST 1 VIEW COMPARISON:  09/22/2022 the FINDINGS: The heart size and mediastinal contours are within normal limits. Focal interstitial opacity in the periphery of the right lower lobe. Left lung is clear. No pleural effusion or pneumothorax. Degenerative changes of both glenohumeral joints. IMPRESSION: Focal interstitial opacity in the periphery of the right lower lobe. Findings could represent atelectasis versus developing infection. Electronically Signed   By: Davina Poke D.O.   On: 09/23/2022 10:43   DG Chest 2 View  Result Date: 09/22/2022 CLINICAL DATA:  Shortness of breath. EXAM: CHEST - 2 VIEW COMPARISON:  September 03, 2021. FINDINGS: The heart size and mediastinal contours are within normal limits. Both lungs are clear. Small hiatal hernia is noted. The visualized skeletal structures are unremarkable. IMPRESSION: No active cardiopulmonary disease.  Small hiatal hernia. Electronically Signed   By: Marijo Conception M.D.   On: 09/22/2022 16:27    Procedures Procedures  {Document cardiac monitor, telemetry assessment procedure when appropriate:1}  Medications Ordered in ED Medications  ipratropium-albuterol (DUONEB) 0.5-2.5 (3) MG/3ML nebulizer solution 3 mL (3 mLs Nebulization  Given 09/23/22 1143)  albuterol (PROVENTIL) (2.5 MG/3ML) 0.083% nebulizer solution 2.5 mg (2.5 mg Nebulization Given 09/23/22 1143)    ED Course/ Medical Decision Making/ A&P                           Medical Decision Making Amount and/or Complexity of Data Reviewed Labs: ordered. Radiology: ordered. ECG/medicine tests: ordered.  Risk Prescription drug management.   Patient with asthma exacerbation.  She will take prednisone and continue her albuterol inhaler and is started on doxycycline and Atrovent inhaler.  {Document critical care time when appropriate:1} {Document review of labs and clinical decision tools ie heart score, Chads2Vasc2 etc:1}  {Document your independent review of radiology images, and any outside records:1} {Document your discussion with family members, caretakers,  and with consultants:1} {Document social determinants of health affecting pt's care:1} {Document your decision making why or why not admission, treatments were needed:1} Final Clinical Impression(s) / ED Diagnoses Final diagnoses:  Exacerbation of intermittent asthma, unspecified asthma severity    Rx / DC Orders ED Discharge Orders          Ordered    doxycycline (VIBRAMYCIN) 100 MG capsule  2 times daily        09/23/22 1431    ipratropium (ATROVENT HFA) 17 MCG/ACT inhaler  Every 6 hours PRN        09/23/22 1431    fluconazole (DIFLUCAN) 150 MG tablet        09/23/22 1431

## 2022-11-16 DIAGNOSIS — Z23 Encounter for immunization: Secondary | ICD-10-CM | POA: Diagnosis not present

## 2022-12-12 DIAGNOSIS — J849 Interstitial pulmonary disease, unspecified: Secondary | ICD-10-CM | POA: Diagnosis not present

## 2022-12-12 DIAGNOSIS — D473 Essential (hemorrhagic) thrombocythemia: Secondary | ICD-10-CM | POA: Diagnosis not present

## 2022-12-12 DIAGNOSIS — M059 Rheumatoid arthritis with rheumatoid factor, unspecified: Secondary | ICD-10-CM | POA: Diagnosis not present

## 2022-12-12 DIAGNOSIS — J432 Centrilobular emphysema: Secondary | ICD-10-CM | POA: Diagnosis not present

## 2022-12-12 DIAGNOSIS — I700 Atherosclerosis of aorta: Secondary | ICD-10-CM | POA: Diagnosis not present

## 2022-12-19 DIAGNOSIS — Z23 Encounter for immunization: Secondary | ICD-10-CM | POA: Diagnosis not present

## 2022-12-19 DIAGNOSIS — J432 Centrilobular emphysema: Secondary | ICD-10-CM | POA: Diagnosis not present

## 2022-12-19 DIAGNOSIS — J849 Interstitial pulmonary disease, unspecified: Secondary | ICD-10-CM | POA: Diagnosis not present

## 2022-12-19 DIAGNOSIS — G4762 Sleep related leg cramps: Secondary | ICD-10-CM | POA: Diagnosis not present

## 2022-12-19 DIAGNOSIS — M059 Rheumatoid arthritis with rheumatoid factor, unspecified: Secondary | ICD-10-CM | POA: Diagnosis not present

## 2022-12-19 DIAGNOSIS — I700 Atherosclerosis of aorta: Secondary | ICD-10-CM | POA: Diagnosis not present

## 2022-12-19 DIAGNOSIS — E782 Mixed hyperlipidemia: Secondary | ICD-10-CM | POA: Diagnosis not present

## 2022-12-19 DIAGNOSIS — D473 Essential (hemorrhagic) thrombocythemia: Secondary | ICD-10-CM | POA: Diagnosis not present

## 2022-12-19 DIAGNOSIS — G250 Essential tremor: Secondary | ICD-10-CM | POA: Diagnosis not present

## 2023-01-30 ENCOUNTER — Telehealth: Payer: Self-pay | Admitting: Emergency Medicine

## 2023-01-30 NOTE — Telephone Encounter (Signed)
Patient states Symbicort needs prior authorization. Pharmacy is [facility]. Patient phone number is [phone].

## 2023-01-31 NOTE — Telephone Encounter (Signed)
Routing to prior auth team to check on this for pt to see if her Symbicort does need a PA.

## 2023-02-01 ENCOUNTER — Other Ambulatory Visit (HOSPITAL_COMMUNITY): Payer: Self-pay

## 2023-02-01 MED ORDER — BUDESONIDE-FORMOTEROL FUMARATE 160-4.5 MCG/ACT IN AERO: 2.0000 | INHALATION_SPRAY | Freq: Two times a day (BID) | RESPIRATORY_TRACT | 3 refills | Status: DC

## 2023-02-01 NOTE — Telephone Encounter (Signed)
Per test claim PA is not needed at this time. Brand Symbicort is covered.

## 2023-02-01 NOTE — Telephone Encounter (Signed)
Attempted to call pt letting her know that her Symbicort inhaler does not require a PA and that she should be able to get it from pharmacy when needed but unable to reach. Left her a detailed message with this info. Nothing further needed.

## 2023-02-06 ENCOUNTER — Other Ambulatory Visit: Payer: Self-pay

## 2023-02-06 ENCOUNTER — Emergency Department (HOSPITAL_COMMUNITY): Payer: Medicare Other

## 2023-02-06 ENCOUNTER — Inpatient Hospital Stay (HOSPITAL_COMMUNITY)
Admission: EM | Admit: 2023-02-06 | Discharge: 2023-02-11 | DRG: 191 | Disposition: A | Discharge status: Home / Self Care | Payer: Medicare Other | Attending: Internal Medicine | Admitting: Internal Medicine

## 2023-02-06 ENCOUNTER — Encounter (HOSPITAL_COMMUNITY): Payer: Self-pay

## 2023-02-06 ENCOUNTER — Ambulatory Visit (HOSPITAL_COMMUNITY)
Admission: EM | Admit: 2023-02-06 | Discharge: 2023-02-06 | Disposition: A | Discharge status: Home / Self Care | Payer: Medicare Other | Attending: Family | Admitting: Family

## 2023-02-06 DIAGNOSIS — E876 Hypokalemia: Secondary | ICD-10-CM | POA: Diagnosis not present

## 2023-02-06 DIAGNOSIS — D849 Immunodeficiency, unspecified: Secondary | ICD-10-CM | POA: Diagnosis not present

## 2023-02-06 DIAGNOSIS — J432 Centrilobular emphysema: Secondary | ICD-10-CM

## 2023-02-06 DIAGNOSIS — J449 Chronic obstructive pulmonary disease, unspecified: Secondary | ICD-10-CM | POA: Diagnosis present

## 2023-02-06 DIAGNOSIS — J45901 Unspecified asthma with (acute) exacerbation: Principal | ICD-10-CM

## 2023-02-06 DIAGNOSIS — J383 Other diseases of vocal cords: Secondary | ICD-10-CM | POA: Diagnosis not present

## 2023-02-06 DIAGNOSIS — R0602 Shortness of breath: Secondary | ICD-10-CM | POA: Diagnosis not present

## 2023-02-06 DIAGNOSIS — Z1152 Encounter for screening for COVID-19: Secondary | ICD-10-CM

## 2023-02-06 DIAGNOSIS — D573 Sickle-cell trait: Secondary | ICD-10-CM | POA: Diagnosis present

## 2023-02-06 DIAGNOSIS — E1136 Type 2 diabetes mellitus with diabetic cataract: Secondary | ICD-10-CM | POA: Diagnosis not present

## 2023-02-06 DIAGNOSIS — J441 Chronic obstructive pulmonary disease with (acute) exacerbation: Secondary | ICD-10-CM | POA: Diagnosis not present

## 2023-02-06 DIAGNOSIS — Z83719 Family history of colon polyps, unspecified: Secondary | ICD-10-CM | POA: Diagnosis not present

## 2023-02-06 DIAGNOSIS — K219 Gastro-esophageal reflux disease without esophagitis: Secondary | ICD-10-CM | POA: Diagnosis not present

## 2023-02-06 DIAGNOSIS — M170 Bilateral primary osteoarthritis of knee: Secondary | ICD-10-CM | POA: Diagnosis present

## 2023-02-06 DIAGNOSIS — M810 Age-related osteoporosis without current pathological fracture: Secondary | ICD-10-CM | POA: Diagnosis present

## 2023-02-06 DIAGNOSIS — M069 Rheumatoid arthritis, unspecified: Secondary | ICD-10-CM | POA: Diagnosis present

## 2023-02-06 DIAGNOSIS — Z86010 Personal history of colonic polyps: Secondary | ICD-10-CM

## 2023-02-06 DIAGNOSIS — Z8709 Personal history of other diseases of the respiratory system: Secondary | ICD-10-CM

## 2023-02-06 DIAGNOSIS — J849 Interstitial pulmonary disease, unspecified: Secondary | ICD-10-CM | POA: Diagnosis present

## 2023-02-06 DIAGNOSIS — Z87891 Personal history of nicotine dependence: Secondary | ICD-10-CM

## 2023-02-06 DIAGNOSIS — E119 Type 2 diabetes mellitus without complications: Secondary | ICD-10-CM

## 2023-02-06 DIAGNOSIS — K449 Diaphragmatic hernia without obstruction or gangrene: Secondary | ICD-10-CM | POA: Diagnosis not present

## 2023-02-06 DIAGNOSIS — J4541 Moderate persistent asthma with (acute) exacerbation: Secondary | ICD-10-CM | POA: Diagnosis not present

## 2023-02-06 DIAGNOSIS — Z880 Allergy status to penicillin: Secondary | ICD-10-CM

## 2023-02-06 DIAGNOSIS — J4489 Other specified chronic obstructive pulmonary disease: Secondary | ICD-10-CM | POA: Diagnosis present

## 2023-02-06 DIAGNOSIS — Z800 Family history of malignant neoplasm of digestive organs: Secondary | ICD-10-CM

## 2023-02-06 DIAGNOSIS — Z8673 Personal history of transient ischemic attack (TIA), and cerebral infarction without residual deficits: Secondary | ICD-10-CM | POA: Diagnosis not present

## 2023-02-06 DIAGNOSIS — Z825 Family history of asthma and other chronic lower respiratory diseases: Secondary | ICD-10-CM | POA: Diagnosis not present

## 2023-02-06 DIAGNOSIS — E785 Hyperlipidemia, unspecified: Secondary | ICD-10-CM | POA: Diagnosis not present

## 2023-02-06 DIAGNOSIS — R062 Wheezing: Secondary | ICD-10-CM | POA: Diagnosis not present

## 2023-02-06 DIAGNOSIS — R0603 Acute respiratory distress: Secondary | ICD-10-CM | POA: Diagnosis not present

## 2023-02-06 DIAGNOSIS — L859 Epidermal thickening, unspecified: Secondary | ICD-10-CM | POA: Diagnosis present

## 2023-02-06 DIAGNOSIS — Z7982 Long term (current) use of aspirin: Secondary | ICD-10-CM | POA: Diagnosis not present

## 2023-02-06 DIAGNOSIS — Z79899 Other long term (current) drug therapy: Secondary | ICD-10-CM

## 2023-02-06 DIAGNOSIS — Z7951 Long term (current) use of inhaled steroids: Secondary | ICD-10-CM | POA: Diagnosis not present

## 2023-02-06 DIAGNOSIS — B9781 Human metapneumovirus as the cause of diseases classified elsewhere: Secondary | ICD-10-CM | POA: Diagnosis present

## 2023-02-06 DIAGNOSIS — Z79631 Long term (current) use of antimetabolite agent: Secondary | ICD-10-CM | POA: Diagnosis not present

## 2023-02-06 DIAGNOSIS — Z888 Allergy status to other drugs, medicaments and biological substances status: Secondary | ICD-10-CM

## 2023-02-06 DIAGNOSIS — Z8249 Family history of ischemic heart disease and other diseases of the circulatory system: Secondary | ICD-10-CM

## 2023-02-06 DIAGNOSIS — B953 Streptococcus pneumoniae as the cause of diseases classified elsewhere: Secondary | ICD-10-CM | POA: Diagnosis present

## 2023-02-06 DIAGNOSIS — J45909 Unspecified asthma, uncomplicated: Secondary | ICD-10-CM | POA: Diagnosis present

## 2023-02-06 LAB — RESPIRATORY PANEL BY PCR

## 2023-02-06 LAB — COMPREHENSIVE METABOLIC PANEL
ALT: 14 U/L (ref 0–44)
AST: 21 U/L (ref 15–41)
Albumin: 3.7 g/dL (ref 3.5–5.0)
Alkaline Phosphatase: 57 U/L (ref 38–126)
Anion gap: 9 (ref 5–15)
BUN: 17 mg/dL (ref 8–23)
CO2: 22 mmol/L (ref 22–32)
Calcium: 8.6 mg/dL — ABNORMAL LOW (ref 8.9–10.3)
Chloride: 105 mmol/L (ref 98–111)
Creatinine, Ser: 1.05 mg/dL — ABNORMAL HIGH (ref 0.44–1.00)
GFR, Estimated: 55 mL/min — ABNORMAL LOW (ref >60)
Glucose, Bld: 87 mg/dL (ref 70–99)
Potassium: 3.3 mmol/L — ABNORMAL LOW (ref 3.5–5.1)
Sodium: 136 mmol/L (ref 135–145)
Total Bilirubin: 0.4 mg/dL (ref 0.3–1.2)
Total Protein: 6.3 g/dL — ABNORMAL LOW (ref 6.5–8.1)

## 2023-02-06 LAB — POC INFLUENZA A AND B ANTIGEN (URGENT CARE ONLY)
INFLUENZA A ANTIGEN, POC: NEGATIVE
INFLUENZA B ANTIGEN, POC: NEGATIVE

## 2023-02-06 LAB — CBC
HCT: 40 % (ref 36.0–46.0)
Hemoglobin: 12.8 g/dL (ref 12.0–15.0)
MCH: 25.2 pg — ABNORMAL LOW (ref 26.0–34.0)
MCHC: 32 g/dL (ref 30.0–36.0)
MCV: 78.9 fL — ABNORMAL LOW (ref 80.0–100.0)
Platelets: 564 10*3/uL — ABNORMAL HIGH (ref 150–400)
RBC: 5.07 MIL/uL (ref 3.87–5.11)
RDW: 18.3 % — ABNORMAL HIGH (ref 11.5–15.5)
WBC: 8.8 10*3/uL (ref 4.0–10.5)
nRBC: 0 % (ref 0.0–0.2)

## 2023-02-06 LAB — RESP PANEL BY RT-PCR (RSV, FLU A&B, COVID)  RVPGX2
Influenza A by PCR: NEGATIVE
Influenza B by PCR: NEGATIVE
Resp Syncytial Virus by PCR: NEGATIVE
SARS Coronavirus 2 by RT PCR: NEGATIVE

## 2023-02-06 LAB — LACTIC ACID, PLASMA: Lactic Acid, Venous: 1.7 mmol/L (ref 0.5–1.9)

## 2023-02-06 LAB — BRAIN NATRIURETIC PEPTIDE: B Natriuretic Peptide: 16 pg/mL (ref 0.0–100.0)

## 2023-02-06 MED ORDER — ACETAMINOPHEN 500 MG PO TABS: 500.0000 mg | ORAL_TABLET | Freq: Four times a day (QID) | ORAL | Status: DC | PRN

## 2023-02-06 MED ORDER — FLUTICASONE FUROATE-VILANTEROL 200-25 MCG/ACT IN AEPB
1.0000 | INHALATION_SPRAY | Freq: Every day | RESPIRATORY_TRACT | Status: DC
  Administered 2023-02-07: 1 via RESPIRATORY_TRACT
  Filled 2023-02-06: qty 28

## 2023-02-06 MED ORDER — SODIUM CHLORIDE 0.9 % IV SOLN
1000.0000 mL | INTRAVENOUS | Status: DC
  Administered 2023-02-06: 1000 mL via INTRAVENOUS

## 2023-02-06 MED ORDER — GABAPENTIN 100 MG PO CAPS
100.0000 mg | ORAL_CAPSULE | Freq: Every day | ORAL | Status: DC
  Administered 2023-02-06 – 2023-02-10 (×5): 100 mg via ORAL
  Filled 2023-02-06 (×5): qty 1

## 2023-02-06 MED ORDER — SODIUM CHLORIDE 0.9 % IV BOLUS (SEPSIS)
500.0000 mL | Freq: Once | INTRAVENOUS | Status: AC
  Administered 2023-02-06: 500 mL via INTRAVENOUS

## 2023-02-06 MED ORDER — PANTOPRAZOLE SODIUM 20 MG PO TBEC
20.0000 mg | DELAYED_RELEASE_TABLET | Freq: Two times a day (BID) | ORAL | Status: DC
  Administered 2023-02-06 – 2023-02-11 (×10): 20 mg via ORAL
  Filled 2023-02-06 (×11): qty 1

## 2023-02-06 MED ORDER — ENOXAPARIN SODIUM 40 MG/0.4ML IJ SOSY
40.0000 mg | PREFILLED_SYRINGE | INTRAMUSCULAR | Status: DC
  Administered 2023-02-06 – 2023-02-10 (×5): 40 mg via SUBCUTANEOUS
  Filled 2023-02-06 (×5): qty 0.4

## 2023-02-06 MED ORDER — METHYLPREDNISOLONE SODIUM SUCC 125 MG IJ SOLR
125.0000 mg | Freq: Once | INTRAMUSCULAR | Status: AC
  Administered 2023-02-06: 125 mg via INTRAVENOUS
  Filled 2023-02-06: qty 2

## 2023-02-06 MED ORDER — MAGNESIUM OXIDE -MG SUPPLEMENT 400 (240 MG) MG PO TABS
400.0000 mg | ORAL_TABLET | Freq: Every day | ORAL | Status: DC
  Administered 2023-02-07 – 2023-02-11 (×5): 400 mg via ORAL
  Filled 2023-02-06 (×5): qty 1

## 2023-02-06 MED ORDER — LEVALBUTEROL HCL 1.25 MG/0.5ML IN NEBU
1.2500 mg | INHALATION_SOLUTION | Freq: Four times a day (QID) | RESPIRATORY_TRACT | Status: DC | PRN
  Administered 2023-02-06 – 2023-02-08 (×2): 1.25 mg via RESPIRATORY_TRACT
  Filled 2023-02-06 (×2): qty 0.5

## 2023-02-06 MED ORDER — IPRATROPIUM BROMIDE 0.02 % IN SOLN
0.5000 mg | Freq: Once | RESPIRATORY_TRACT | Status: AC
  Administered 2023-02-06: 0.5 mg via RESPIRATORY_TRACT
  Filled 2023-02-06: qty 2.5

## 2023-02-06 MED ORDER — LEVALBUTEROL HCL 0.63 MG/3ML IN NEBU
0.6300 mg | INHALATION_SOLUTION | Freq: Two times a day (BID) | RESPIRATORY_TRACT | Status: DC
  Administered 2023-02-06 – 2023-02-07 (×3): 0.63 mg via RESPIRATORY_TRACT
  Filled 2023-02-06 (×3): qty 3

## 2023-02-06 MED ORDER — ASPIRIN 81 MG PO TBEC
81.0000 mg | DELAYED_RELEASE_TABLET | Freq: Every day | ORAL | Status: DC
  Administered 2023-02-07 – 2023-02-11 (×5): 81 mg via ORAL
  Filled 2023-02-06 (×5): qty 1

## 2023-02-06 MED ORDER — IPRATROPIUM-ALBUTEROL 0.5-2.5 (3) MG/3ML IN SOLN
RESPIRATORY_TRACT | Status: AC
  Filled 2023-02-06: qty 3

## 2023-02-06 MED ORDER — ALBUTEROL SULFATE (2.5 MG/3ML) 0.083% IN NEBU
10.0000 mg/h | INHALATION_SOLUTION | Freq: Once | RESPIRATORY_TRACT | Status: AC
  Administered 2023-02-06: 10 mg/h via RESPIRATORY_TRACT
  Filled 2023-02-06: qty 12

## 2023-02-06 MED ORDER — FOLIC ACID 1 MG PO TABS
500.0000 ug | ORAL_TABLET | Freq: Every day | ORAL | Status: DC
  Administered 2023-02-07 – 2023-02-11 (×5): 0.5 mg via ORAL
  Filled 2023-02-06 (×5): qty 1

## 2023-02-06 MED ORDER — MAGNESIUM OXIDE 400 MG PO TABS: 400.0000 mg | ORAL_TABLET | Freq: Every morning | ORAL | Status: DC

## 2023-02-06 MED ORDER — SODIUM CHLORIDE 0.9 % IV SOLN
1000.0000 mL | INTRAVENOUS | Status: DC
  Administered 2023-02-06 – 2023-02-07 (×2): 1000 mL via INTRAVENOUS

## 2023-02-06 MED ORDER — GUAIFENESIN ER 600 MG PO TB12
600.0000 mg | ORAL_TABLET | Freq: Two times a day (BID) | ORAL | Status: DC
  Administered 2023-02-06 – 2023-02-07 (×3): 600 mg via ORAL
  Filled 2023-02-06 (×3): qty 1

## 2023-02-06 MED ORDER — IPRATROPIUM-ALBUTEROL 0.5-2.5 (3) MG/3ML IN SOLN
3.0000 mL | Freq: Once | RESPIRATORY_TRACT | Status: AC
  Administered 2023-02-06: 3 mL via RESPIRATORY_TRACT

## 2023-02-06 MED ORDER — VITAMIN B-12 1000 MCG PO TABS
1000.0000 ug | ORAL_TABLET | Freq: Every day | ORAL | Status: DC
  Administered 2023-02-07 – 2023-02-11 (×5): 1000 ug via ORAL
  Filled 2023-02-06 (×5): qty 1

## 2023-02-06 MED ORDER — AZITHROMYCIN 250 MG PO TABS
500.0000 mg | ORAL_TABLET | Freq: Every day | ORAL | Status: AC
  Administered 2023-02-07 – 2023-02-10 (×4): 500 mg via ORAL
  Filled 2023-02-06 (×4): qty 2

## 2023-02-06 MED ORDER — SODIUM CHLORIDE 0.9 % IV SOLN
500.0000 mg | INTRAVENOUS | Status: AC
  Administered 2023-02-06: 500 mg via INTRAVENOUS
  Filled 2023-02-06: qty 5

## 2023-02-06 MED ORDER — HYDROXYUREA 500 MG PO CAPS
500.0000 mg | ORAL_CAPSULE | Freq: Three times a day (TID) | ORAL | Status: DC
  Administered 2023-02-07 – 2023-02-11 (×14): 500 mg via ORAL
  Filled 2023-02-06 (×14): qty 1

## 2023-02-06 MED ORDER — MONTELUKAST SODIUM 10 MG PO TABS
10.0000 mg | ORAL_TABLET | Freq: Every day | ORAL | Status: DC
  Administered 2023-02-06 – 2023-02-10 (×5): 10 mg via ORAL
  Filled 2023-02-06 (×5): qty 1

## 2023-02-06 MED ORDER — METHYLPREDNISOLONE SODIUM SUCC 40 MG IJ SOLR
40.0000 mg | Freq: Two times a day (BID) | INTRAMUSCULAR | Status: DC
  Administered 2023-02-07: 40 mg via INTRAVENOUS
  Filled 2023-02-06: qty 1

## 2023-02-06 MED ORDER — MAGNESIUM SULFATE 2 GM/50ML IV SOLN
2.0000 g | Freq: Once | INTRAVENOUS | Status: AC
  Administered 2023-02-06: 2 g via INTRAVENOUS
  Filled 2023-02-06: qty 50

## 2023-02-06 MED ORDER — ORAL CARE MOUTH RINSE: 15.0000 mL | OROMUCOSAL | Status: DC | PRN

## 2023-02-06 MED ORDER — ACETAMINOPHEN 325 MG PO TABS
650.0000 mg | ORAL_TABLET | Freq: Once | ORAL | Status: AC
  Administered 2023-02-06: 650 mg via ORAL
  Filled 2023-02-06: qty 2

## 2023-02-06 MED ORDER — IPRATROPIUM BROMIDE HFA 17 MCG/ACT IN AERS: 2.0000 | INHALATION_SPRAY | Freq: Four times a day (QID) | RESPIRATORY_TRACT | Status: DC | PRN

## 2023-02-06 MED ORDER — ALBUTEROL SULFATE HFA 108 (90 BASE) MCG/ACT IN AERS
2.0000 | INHALATION_SPRAY | RESPIRATORY_TRACT | Status: DC | PRN
  Filled 2023-02-06: qty 6.7

## 2023-02-06 NOTE — ED Notes (Signed)
Read chart

## 2023-02-06 NOTE — ED Provider Notes (Signed)
[place]    CSN: [id number] Arrival date & time: 02/06/23  1043      History   Chief Complaint Chief Complaint  Patient presents with   Fever    HPI [name] is a 77 y.o. female.   77 year old female presents with fever, cough, wheezing and shortness of breath that started yesterday. Slight nasal congestion but more chest congestion. Denies any GI symptoms. Has history of asthma/COPD and has used her Albuterol nebulizer machine at home yesterday and today with minimal relief. Also is on Symbicort and Atrovent inhalers daily. Has history of rheumatoid arthritis and is on Methotrexate daily. Other medications include Zyrtec, Singulair, Protonix and Crestor daily.   The history is provided by the patient.    Past Medical History:  Diagnosis Date   Allergic rhinitis    Allergy    seasonal allergies   Anemia    hx of   Asthma    on meds   Cataract    bilateral sx    Chronic bronchitis (HCC)    Colonic polyp    DDD (degenerative disc disease), lumbar 07/20/2011   DJD (degenerative joint disease) of knee    bilateral   DJD (degenerative joint disease), lumbar 07/20/2011   Esophageal stricture    GERD (gastroesophageal reflux disease)    on meds   Hiatal hernia    Hyperlipidemia    on meds   Osteoporosis    Stroke [facility]) 2017   "didn't know I'd had one before I was told; didn't do any damage" (11/24/2016)   Vocal cord dysfunction     Patient Active Problem List   Diagnosis Date Noted   Abnormal gait 01/12/2022   Carpal tunnel syndrome 01/12/2022   Essential tremor 01/12/2022   Skin sensation disturbance 01/12/2022   History of stroke 09/04/2021   Candidiasis 09/01/2021   Chronic kidney disease, stage 2 (mild) 08/12/2021   Centrilobular emphysema ([place]) 04/13/2021   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   Cramp and spasm 03/01/2021   Cramp in lower leg associated with rest 03/01/2021   Hardening of the aorta (main artery of the heart)  ([place]) 03/01/2021   Leukopenia 03/01/2021   Menopause 03/01/2021   Multiple joint pain 03/01/2021   Obesity 03/01/2021   Rheumatoid arthritis ([place]) 03/01/2021   Severe major depression, single episode, without psychotic features ([place]) 03/01/2021   Hyperglycemia 02/21/2021   Asthma-COPD overlap syndrome 07/08/2020   On methotrexate therapy 07/10/2018   Abnormal laboratory test 03/29/2018   Sickle cell trait ([place]) 01/20/2017   JAK-2 gene mutation 12/06/2016   AKI (acute kidney injury) ([place]) 11/24/2016   Right shoulder pain 11/24/2016   Acute respiratory distress 11/24/2016   RLS (restless legs syndrome) 11/24/2016   Restless leg syndrome 10/13/2016   Primary osteoarthritis of left knee 02/18/2015   Shingles 12/19/2014   ILD (interstitial lung disease) ([place])    Essential thrombocythemia ([place]) 12/24/2013   S/P left knee arthroscopy 11/11/2013   TIA (transient ischemic attack) 09/01/2012   Chest pain 09/01/2012   Arm numbness left 06/03/2012   Migraine 05/31/2012   Dyspnea on exertion 08/09/2011   DDD (degenerative disc disease), lumbar 07/20/2011   DJD (degenerative joint disease), lumbar 07/20/2011   CVA (cerebral infarction) 07/20/2011   Syncope and collapse 07/12/2011   Acute lumbar radiculopathy 07/12/2011   Encounter for general adult medical examination without abnormal findings 07/10/2011   TRIGGER FINGER, RIGHT MIDDLE 01/11/2011   Other dysphagia 01/11/2011   MENOPAUSAL DISORDER 07/05/2010  Osteoarthritis 07/05/2010   NEVUS 05/20/2010   VAGINITIS 03/23/2010   SUPERFICIAL THROMBOPHLEBITIS 03/20/2009   FATIGUE 03/20/2009   DYSPHAGIA UNSPECIFIED 02/16/2009   Snoring 11/05/2008   HERPES ZOSTER 06/30/2008   Diabetes ([place]) 06/30/2008   Mixed hyperlipidemia 06/30/2008   ESOPHAGEAL STRICTURE 06/30/2008   FOOT PAIN, RIGHT 06/30/2008   OSTEOPOROSIS 06/30/2008   COLONIC POLYPS, HX OF 06/30/2008   PANIC ATTACK 09/05/2007   Allergic rhinitis 09/05/2007   Vocal cord  dysfunction 09/05/2007   Gastroesophageal reflux disease without esophagitis 09/05/2007    Past Surgical History:  Procedure Laterality Date   CARPAL TUNNEL RELEASE Bilateral    COLONOSCOPY  2017   JP-suprep(exc)=TA x 2   COLONOSCOPY W/ BIOPSIES AND POLYPECTOMY     ESOPHAGOGASTRODUODENOSCOPY N/A 02/03/2022   Procedure: ESOPHAGOGASTRODUODENOSCOPY (EGD);  Surgeon: Milus Banister, MD;  Location: [facility];  Service: Endoscopy;  Laterality: N/A;   ESOPHAGOGASTRODUODENOSCOPY (EGD) WITH ESOPHAGEAL DILATION     EUS N/A 02/03/2022   Procedure: UPPER ENDOSCOPIC ULTRASOUND (EUS) RADIAL;  Surgeon: Milus Banister, MD;  Location: [facility];  Service: Endoscopy;  Laterality: N/A;   FOOT SURGERY Bilateral    "might have been bunions or hammertoes"   [place] ARTHROSCOPY Left 11/06/2013   POLYPECTOMY  2017   TA x 2   TONSILLECTOMY     TOTAL ABDOMINAL HYSTERECTOMY     UPPER GASTROINTESTINAL ENDOSCOPY  01/20/2022   VIDEO BRONCHOSCOPY Bilateral 12/12/2014   Procedure: VIDEO BRONCHOSCOPY WITHOUT FLUORO;  Surgeon: Chesley Mires, MD;  Location: [place];  Service: Cardiopulmonary;  Laterality: Bilateral;   WISDOM TOOTH EXTRACTION      OB History   No obstetric history on file.      Home Medications    Prior to Admission medications   Medication Sig Start Date End Date Taking? Authorizing Provider  acetaminophen (TYLENOL) 500 MG tablet Take 500-1,000 mg by mouth every 6 (six) hours as needed (pain.).   Yes [provider]  albuterol (PROVENTIL HFA) 108 (90 Base) MCG/ACT inhaler Inhale 1-2 puffs into the lungs every 6 (six) hours as needed for wheezing or shortness of breath. 09/05/22  Yes Collene Gobble, MD  aspirin EC 81 MG tablet Take 81 mg by mouth in the morning.   Yes [provider]  budesonide-formoterol (SYMBICORT) 160-4.5 MCG/ACT inhaler Inhale 2 puffs into the lungs in the morning and at bedtime. 02/01/23  Yes Collene Gobble, MD   CALCIUM-CHOLECALCIFEROL PO Take 1 tablet by mouth in the morning.   Yes [provider]  cetirizine (ZYRTEC ALLERGY) 10 MG tablet Take 1 tablet (10 mg total) by mouth daily. 12/14/21  Yes Cobb, Karie Schwalbe, NP  cholecalciferol (VITAMIN D3) 25 MCG (1000 UNIT) tablet Take 1,000 Units by mouth daily.   Yes [provider]  fluticasone (FLONASE) 50 MCG/ACT nasal spray Place 1 spray into the nose 2 (two) times daily.   Yes [provider]  folic acid (FOLVITE) A999333 MCG tablet Take 400 mcg by mouth in the morning.   Yes [provider]  hydroxyurea (HYDREA) 500 MG capsule Take 1 capsule (500 mg total) by mouth 3 (three) times daily before meals. 09/12/22  Yes [name], MD  magnesium oxide (MAG-OX) 400 MG tablet Take 400 mg by mouth in the morning.   Yes [provider]  methotrexate (RHEUMATREX) 2.5 MG tablet 6 tabs Orally weekly for 90   Yes [provider]  montelukast (SINGULAIR) 10 MG tablet Take 1 tablet (  10 mg total) by mouth at bedtime. 03/11/21  Yes Collene Gobble, MD  montelukast (SINGULAIR) 10 MG tablet 1 tablet Orally Once a day for 30 day(s)   Yes [provider]  Multiple Vitamins-Minerals (CENTRUM SILVER 50+WOMEN PO) Take 1 tablet by mouth in the morning.   Yes [provider]  pantoprazole (PROTONIX) 20 MG tablet Take 20 mg by mouth 2 (two) times daily. 11/04/21  Yes [provider]  rosuvastatin (CRESTOR) 10 MG tablet Take 10 mg by mouth at bedtime. 02/13/21  Yes [provider]  vitamin B-12 (CYANOCOBALAMIN) 1000 MCG tablet Take 1,000 mcg by mouth in the morning and at bedtime.   Yes [provider]  AMBULATORY NON FORMULARY MEDICATION Medication Name: oral iron once daily    [provider]  gabapentin (NEURONTIN) 100 MG capsule Take 100 mg by mouth at bedtime. 08/27/20   [provider]  ipratropium (ATROVENT HFA) 17 MCG/ACT inhaler Inhale 2 puffs into the lungs every 6  (six) hours as needed for wheezing. 09/23/22   Milton Ferguson, MD    Family History Family History  Problem Relation Age of Onset   Heart attack Mother    Colon cancer Father 41   Colon polyps Father 57   Colon polyps Sister 51   Colon cancer Sister 61   Asthma Brother    Asthma Other        uncle   Colon polyps Daughter 47   Stomach cancer Neg Hx    Esophageal cancer Neg Hx    Rectal cancer Neg Hx     Social History Social History   Tobacco Use   Smoking status: Former    Packs/day: 0.10    Years: 30.00    Total pack years: 3.00    Types: Cigarettes    Start date: 01/31/1975    Quit date: 08/09/1995    Years since quitting: 27.5   Smokeless tobacco: Never  Vaping Use   Vaping Use: Never used  Substance Use Topics   Alcohol use: Yes    Alcohol/week: 0.0 standard drinks of alcohol    Comment: once per 3 months   Drug use: No     Allergies   Statins, Penicillins, and Pravastatin   Review of Systems Review of Systems  Constitutional:  Positive for activity change, fatigue and fever.  HENT:  Positive for congestion. Negative for sinus pain and trouble swallowing.   Respiratory:  Positive for cough, chest tightness, shortness of breath and wheezing.   Gastrointestinal:  Negative for nausea and vomiting.  Skin:  Negative for color change and rash.  Allergic/Immunologic: Positive for environmental allergies and immunocompromised state.  Neurological:  Positive for light-headedness. Negative for tremors, seizures, syncope, facial asymmetry, speech difficulty and numbness.  Hematological:  Negative for adenopathy. Does not bruise/bleed easily.     Physical Exam Triage Vital Signs ED Triage Vitals  Enc Vitals Group     BP 02/06/23 1047 119/65     Pulse Rate 02/06/23 1047 (!) 113     Resp 02/06/23 1047 20     Temp 02/06/23 1047 (!) 100.8 F (38.2 C)     Temp Source 02/06/23 1047 Oral     SpO2 02/06/23 1047 95 %     Weight --      Height --      Head  Circumference --      Peak Flow --      Pain Score 02/06/23 1051 0     Pain  Loc --      Pain Edu? --      Excl. in [place]? --    No data found.  Updated Vital Signs BP 119/65 (BP Location: Right Arm)   Pulse (!) 113   Temp (!) 100.8 F (38.2 C) (Oral)   Resp 20   SpO2 95%   Visual Acuity Right Eye Distance:   Left Eye Distance:   Bilateral Distance:    Right Eye Near:   Left Eye Near:    Bilateral Near:     Physical Exam Vitals and nursing note reviewed.  Constitutional:      General: She is awake. She is not in acute distress.    Appearance: She is well-developed. She is ill-appearing.     Comments: She is sitting in the chair in triage and has increased work of breathing and appears ill and tired.   HENT:     Head: Normocephalic and atraumatic.     Right Ear: Hearing normal.     Left Ear: Hearing normal.  Cardiovascular:     Rate and Rhythm: Regular rhythm. Tachycardia present.     Heart sounds: Normal heart sounds. No murmur heard. Pulmonary:     Effort: Tachypnea and accessory muscle usage present. No respiratory distress.     Breath sounds: Normal air entry. No decreased air movement. Examination of the right-upper field reveals wheezing and rhonchi. Examination of the left-upper field reveals wheezing and rhonchi. Examination of the right-middle field reveals wheezing and rhonchi. Examination of the right-lower field reveals wheezing and rhonchi. Examination of the left-lower field reveals wheezing and rhonchi. Wheezing and rhonchi present.     Comments: Repeated pulse Ox after Duoneb treatment and was still 95% Skin:    General: Skin is warm and dry.     Findings: No rash.  Neurological:     General: No focal deficit present.     Mental Status: She is alert and oriented to person, place, and time.  Psychiatric:        Mood and Affect: Mood normal.        Behavior: Behavior normal. Behavior is cooperative.      UC Treatments / Results  Labs (all labs  ordered are listed, but only abnormal results are displayed) Labs Reviewed  POC INFLUENZA A AND B ANTIGEN (URGENT CARE ONLY)    EKG   Radiology No results found.  Procedures Procedures (including critical care time)  Medications Ordered in UC Medications  ipratropium-albuterol (DUONEB) 0.5-2.5 (3) MG/3ML nebulizer solution 3 mL (3 mLs Nebulization Given 02/06/23 1102)    Initial Impression / Assessment and Plan / UC Course  I have reviewed the triage vital signs and the nursing notes.  Pertinent labs & imaging results that were available during my care of the patient were reviewed by me and considered in my medical decision making (see chart for details).     Performed Duoneb treatment- patient indicated that she had slight improvement in breathing but then coughed and still exhibits shortness of breath and labored breathing. Pulse Ox after treatment still around 95%. Lung sounds did not improve after nebulizer- still has significant wheezes and rhonchi in all lung fields. Rapid influenza testing was negative.  Discussed with patient that since she is still having difficulty breathing with significant wheezes and rhonchi, has history of asthma/COPD and she has a fever and tachycardic along with immunosuppression, she needs to be seen at the ER for further evaluation. Concern over viral or bacterial pneumonia and  due to underlying health issues, may need to be hospitalized for treatment. Patient understands and agrees with plan. Will go to [facility] ER now for further evaluation.  Final Clinical Impressions(s) / UC Diagnoses   Final diagnoses:  Shortness of breath  Wheezing  History of asthma  Immunosuppressed status ([place])     Discharge Instructions      Due to continued wheezing and shortness of breath, along with low grade fever and increased pulse, you need to be seen and evaluated at the ER for further evaluation.      ED Prescriptions   None    PDMP not reviewed  this encounter.   Katy Apo, NP 02/06/23 1209

## 2023-02-06 NOTE — ED Notes (Signed)
Called to 4th floor to speak with nurse about getting this patient upstairs, nurse is not answering phone, Network engineer will update nurse.

## 2023-02-06 NOTE — H&P (Signed)
HPI  [name] [id number] DOB: [birth year] DOA: 02/06/2023  PCP: Merrilee Seashore, MD   Chief Complaint: cough  HPI:  77 year old black female Asthma with COPD overlap and vocal cord dysfunction-follows with Dr. Rodney Cruise 87% predicted-Per his note she has managing chronic cough and stridor There is some element of underlying mild interstitial disease and moderate persistent asthma per office notes--last seen OV by rheum 08/25/2022 Rheumatoid arthritis on methotrexate--- the patient does follow-up with Dr. Ashby Dawes for her rheumatoid arthritis and has been on methotrexate "for a long time" Prior CVA Prior adenomatous polyps followed by Dr. Henrene Pastor with family history of colon cancer age 66 HLD DM TY 2 Most recent admit 09/03/2021-->09/06/2021 asthma with COPD overlap and ILD  She describes having cough cold fever for the past several days and feeling worse and worse-she has no sick contacts at home lives with the daughter and no small children no recent travel no exotic pets-she thinks that this may be the asthma flaring up she tells me last time she smoked was over 20 years ago she was diagnosed with asthma about 30 years ago in her mid 76s She tells me she was diagnosed by using a PFT machine after I explained this in layman terms.  Came to [place] emergency room 02/05/2022 SOB body aches chills anorexia wheeze-no relief Symbicort Atrovent or nebs Received DuoNeb with slight improvement but still some SOB Tmax  102.4,  heart rate 110-120 not hypotensive   CXR negative blood for acute infection Blood culture obtained  Review of Systems:  As per below  Pertinent +'s: Fever+,  Pertinent -"s: - Diarrhea, - rhinorrhea, - vomiting, - rash, - joint pain, - dark stool, - tarry stool, - vomiting, minus  ED Course: In ED Rx Solu-Medrol x 1 magnesium sulfate 1 g,  albuterol continuous nebulization 10 mg then inhaler and given Atrovent    Past Medical History:  Diagnosis  Date   Allergic rhinitis    Allergy    seasonal allergies   Anemia    hx of   Asthma    on meds   Cataract    bilateral sx    Chronic bronchitis (HCC)    Colonic polyp    DDD (degenerative disc disease), lumbar 07/20/2011   DJD (degenerative joint disease) of knee    bilateral   DJD (degenerative joint disease), lumbar 07/20/2011   Esophageal stricture    GERD (gastroesophageal reflux disease)    on meds   Hiatal hernia    Hyperlipidemia    on meds   Osteoporosis    Stroke [facility]) 2017   "didn't know I'd had one before I was told; didn't do any damage" (11/24/2016)   Vocal cord dysfunction    Past Surgical History:  Procedure Laterality Date   CARPAL TUNNEL RELEASE Bilateral    COLONOSCOPY  2017   JP-suprep(exc)=TA x 2   COLONOSCOPY W/ BIOPSIES AND POLYPECTOMY     ESOPHAGOGASTRODUODENOSCOPY N/A 02/03/2022   Procedure: ESOPHAGOGASTRODUODENOSCOPY (EGD);  Surgeon: Milus Banister, MD;  Location: [facility];  Service: Endoscopy;  Laterality: N/A;   ESOPHAGOGASTRODUODENOSCOPY (EGD) WITH ESOPHAGEAL DILATION     EUS N/A 02/03/2022   Procedure: UPPER ENDOSCOPIC ULTRASOUND (EUS) RADIAL;  Surgeon: Milus Banister, MD;  Location: [facility];  Service: Endoscopy;  Laterality: N/A;   FOOT SURGERY Bilateral    "might have been bunions or hammertoes"   [place] ARTHROSCOPY Left 11/06/2013   POLYPECTOMY  2017   TA  x 2   TONSILLECTOMY     TOTAL ABDOMINAL HYSTERECTOMY     UPPER GASTROINTESTINAL ENDOSCOPY  01/20/2022   VIDEO BRONCHOSCOPY Bilateral 12/12/2014   Procedure: VIDEO BRONCHOSCOPY WITHOUT FLUORO;  Surgeon: Chesley Mires, MD;  Location: [place];  Service: Cardiopulmonary;  Laterality: Bilateral;   WISDOM TOOTH EXTRACTION      reports that she quit smoking about 27 years ago. Her smoking use included cigarettes. She started smoking about 48 years ago. She has a 3.00 pack-year smoking history. She has never used smokeless tobacco. She reports current alcohol  use. She reports that she does not use drugs.  Mobility: Independent  Allergies  Allergen Reactions   Statins Other (See Comments)    Leg cramps   Penicillins Itching and Rash    Did it involve swelling of the face/tongue/throat, SOB, or low BP? N Did it involve sudden or severe rash/hives, skin peeling, or any reaction on the inside of your mouth or nose? Y Did you need to seek medical attention at a hospital or doctor's office? N When did it last happen? Decades Ago      If all above answers are "NO", may proceed with cephalosporin use.     Pravastatin Other (See Comments)    leg cramps   Family History  Problem Relation Age of Onset   Heart attack Mother    Colon cancer Father 23   Colon polyps Father 47   Colon polyps Sister 83   Colon cancer Sister 24   Asthma Brother    Asthma Other        uncle   Colon polyps Daughter 59   Stomach cancer Neg Hx    Esophageal cancer Neg Hx    Rectal cancer Neg Hx    Prior to Admission medications   Medication Sig Start Date End Date Taking? Authorizing Provider  acetaminophen (TYLENOL) 500 MG tablet Take 500-1,000 mg by mouth every 6 (six) hours as needed (pain.).    [provider]  albuterol (PROVENTIL HFA) 108 (90 Base) MCG/ACT inhaler Inhale 1-2 puffs into the lungs every 6 (six) hours as needed for wheezing or shortness of breath. 09/05/22   Collene Gobble, MD  AMBULATORY NON FORMULARY MEDICATION Medication Name: oral iron once daily    [provider]  aspirin EC 81 MG tablet Take 81 mg by mouth in the morning.    [provider]  budesonide-formoterol (SYMBICORT) 160-4.5 MCG/ACT inhaler Inhale 2 puffs into the lungs in the morning and at bedtime. 02/01/23   Collene Gobble, MD  CALCIUM-CHOLECALCIFEROL PO Take 1 tablet by mouth in the morning.    [provider]  cetirizine (ZYRTEC ALLERGY) 10 MG tablet Take 1 tablet (10 mg total) by mouth daily. 12/14/21   Cobb, Karie Schwalbe, NP  cholecalciferol  (VITAMIN D3) 25 MCG (1000 UNIT) tablet Take 1,000 Units by mouth daily.    [provider]  fluticasone (FLONASE) 50 MCG/ACT nasal spray Place 1 spray into the nose 2 (two) times daily.    [provider]  folic acid (FOLVITE) A999333 MCG tablet Take 400 mcg by mouth in the morning.    [provider]  gabapentin (NEURONTIN) 100 MG capsule Take 100 mg by mouth at bedtime. 08/27/20   [provider]  hydroxyurea (HYDREA) 500 MG capsule Take 1 capsule (500 mg total) by mouth 3 (three) times daily before meals. 09/12/22   [name], MD  ipratropium (ATROVENT HFA) 17 MCG/ACT inhaler Inhale 2  puffs into the lungs every 6 (six) hours as needed for wheezing. 09/23/22   Milton Ferguson, MD  magnesium oxide (MAG-OX) 400 MG tablet Take 400 mg by mouth in the morning.    [provider]  methotrexate (RHEUMATREX) 2.5 MG tablet 6 tabs Orally weekly for 90    [provider]  montelukast (SINGULAIR) 10 MG tablet Take 1 tablet (10 mg total) by mouth at bedtime. 03/11/21   Collene Gobble, MD  montelukast (SINGULAIR) 10 MG tablet 1 tablet Orally Once a day for 30 day(s)    [provider]  Multiple Vitamins-Minerals (CENTRUM SILVER 50+WOMEN PO) Take 1 tablet by mouth in the morning.    [provider]  pantoprazole (PROTONIX) 20 MG tablet Take 20 mg by mouth 2 (two) times daily. 11/04/21   [provider]  rosuvastatin (CRESTOR) 10 MG tablet Take 10 mg by mouth at bedtime. 02/13/21   [provider]  vitamin B-12 (CYANOCOBALAMIN) 1000 MCG tablet Take 1,000 mcg by mouth in the morning and at bedtime.    [provider]    Physical Exam:  Vitals:   02/06/23 1515 02/06/23 1545  BP:  123/62  Pulse:  (!) 113  Resp:  18  Temp: 99.4 F (37.4 C)   SpO2:  100%    Awake coherent seems a little bit tired (tells me has not slept overnight) No icterus no pallor neck soft supple no submandibular lymphadenopathy cannot  appreciate anything on the back of her throat She has transmitted breath sounds from her neck-when she breathes through her nose the wheezing seems to diminish some S1-S2 tachycardic 100 range Abdomen soft no rebound She does have some hyperkeratosis to her lower extremities bilaterally but no rash no swelling Psych is euthymic she is coherent she is pleasant   I have personally reviewed following labs and imaging studies  Labs:  Rapid flu negative, white count 8, lactic acid 1.7, BNP 16, potassium 3.3 BUN/creatinine 17/1.0    Imaging studies:  CXR negative  Medical tests:  EKG independently reviewed: Very poor tracing with very wandering baseline, PR interval 0.2 sinus tach no other interpretation can be given EKG to be repeated  Test discussed with performing physician: Yes discussed with Dr. Tomi Bamberger  Decision to obtain old records:  Yes reviewed  Review and summation of old records:  Yes  Principal Problem:   COPD (chronic obstructive pulmonary disease) (HCC)   Assessment/Plan Acute asthma exacerbation with COPD overlap Underlying vocal cord dysfunction - Replace albuterol with Xopenex for what is worth-trying to prevent tachycardia - Repeat Solu-Medrol but at 40 twice daily -Resume Symbicort replacement Breo Ellipta, Atrovent 2 puffs every 6 continue Singulair at bedtime -Peak flow twice daily, Xopenex twice daily discussed with respiratory-pre and post was 110 but limited because patient was coughing -Obtain respiratory viral panel/sputum culture as COVID flu RSV are negative-suspect this is either viral infection but cannot be sure - Follow-up blood culture ordered by ED, lactic acid was 1.7 and would not cycle  ?Rheumatoid arthritis flare - Is any Magin to be on steroids which should help with inflammation, of academic interest only to obtain CCP etc. etc. - Continue methotrexate 6 tablets weekly, continue Folvite 400 mcg a.m. B12 1000 mcg a.m. p.m. Resume  gabapentin daily at bedtime will need outpatient follow-up Continue aspirin 81 mg  Reflux - Continue Protonix 20 twice daily  Sickle cell trait on hydroxyurea?? - T outpatient discussed-continue 500 3 times daily   Severity of  Illness: The appropriate patient status for this patient is INPATIENT. Inpatient status is judged to be reasonable and necessary in order to provide the required intensity of service to ensure the patient's safety. The patient's presenting symptoms, physical exam findings, and initial radiographic and laboratory data in the context of their chronic comorbidities is felt to place them at high risk for further clinical deterioration. Furthermore, it is not anticipated that the patient will be medically stable for discharge from the hospital within 2 midnights of admission.   * I certify that at the point of admission it is my clinical judgment that the patient will require inpatient hospital care spanning beyond 2 midnights from the point of admission due to high intensity of service, high risk for further deterioration and high frequency of surveillance required.*   DVT prophylaxis: Lovenox Code Status: Full code Family Communication: Discussed with patient's granddaughter at the bedside who understands Consults called: None  Time spent: 33 minutes minutes  Verlon Au, MD Jerl Mina my NP partners at night for Care related issues] [facility] [employer] [place] , [email or web address]; password [facility]  02/06/2023, 4:39 PM

## 2023-02-06 NOTE — Discharge Instructions (Signed)
Due to continued wheezing and shortness of breath, along with low grade fever and increased pulse, you need to be seen and evaluated at the ER for further evaluation.

## 2023-02-06 NOTE — ED Triage Notes (Addendum)
Pt is here for fever, SOB, body aches  aches, chills, loss appetite wheezing  x 1day

## 2023-02-06 NOTE — Progress Notes (Signed)
RT Note: Pt. seen in ED, given scheduled (0.63) mg Xopenex at [place] with Pre/Post Peak flows obtained with following results: Pre/Post:130/130lpm. Pt. tolerated well, in mild distress, RT to monitor.

## 2023-02-06 NOTE — ED Notes (Signed)
Patient is being discharged from the Urgent Care and sent to the Emergency Department via car  . Per Zoe Lan , patient is in need of higher level of care due to higher level of care. Patient is aware and verbalizes understanding of plan of care.  Vitals:   02/06/23 1047  BP: 119/65  Pulse: (!) 113  Resp: 20  Temp: (!) 100.8 F (38.2 C)  SpO2: 95%

## 2023-02-06 NOTE — ED Provider Notes (Signed)
[place] AT [facility] Provider Note   CSN: [id number] Arrival date & time: 02/06/23  1317     History {Add pertinent medical, surgical, social history, OB history to HPI:1} Chief Complaint  Patient presents with   Shortness of Breath    [name] is a 77 y.o. female.   Shortness of Breath    Patient has a history of vocal cord dysfunction, hiatal hernia, bronchitis, asthma, degenerative disc disease, reflux, hyperlipidemia, and stroke.  Patient presents ED with complaints of acute shortness of breath that started yesterday.  Patient felt like her asthma was acting up yesterday.  She was feeling short of breath.  She has been using her treatments without much relief.  Patient had a persistent dry cough.  She went to an urgent care today for evaluation and continued to wheeze so she was sent to the ED.  Patient was also noted to have a fever today.  She was not aware of that.  She denies any chest pain.  No abdominal pain.  No dysuria.  Home Medications Prior to Admission medications   Medication Sig Start Date End Date Taking? Authorizing Provider  acetaminophen (TYLENOL) 500 MG tablet Take 500-1,000 mg by mouth every 6 (six) hours as needed (pain.).    [provider]  albuterol (PROVENTIL HFA) 108 (90 Base) MCG/ACT inhaler Inhale 1-2 puffs into the lungs every 6 (six) hours as needed for wheezing or shortness of breath. 09/05/22   Collene Gobble, MD  AMBULATORY NON FORMULARY MEDICATION Medication Name: oral iron once daily    [provider]  aspirin EC 81 MG tablet Take 81 mg by mouth in the morning.    [provider]  budesonide-formoterol (SYMBICORT) 160-4.5 MCG/ACT inhaler Inhale 2 puffs into the lungs in the morning and at bedtime. 02/01/23   Collene Gobble, MD  CALCIUM-CHOLECALCIFEROL PO Take 1 tablet by mouth in the morning.    [provider]  cetirizine (ZYRTEC ALLERGY) 10 MG tablet Take 1 tablet  (10 mg total) by mouth daily. 12/14/21   Cobb, Karie Schwalbe, NP  cholecalciferol (VITAMIN D3) 25 MCG (1000 UNIT) tablet Take 1,000 Units by mouth daily.    [provider]  fluticasone (FLONASE) 50 MCG/ACT nasal spray Place 1 spray into the nose 2 (two) times daily.    [provider]  folic acid (FOLVITE) A999333 MCG tablet Take 400 mcg by mouth in the morning.    [provider]  gabapentin (NEURONTIN) 100 MG capsule Take 100 mg by mouth at bedtime. 08/27/20   [provider]  hydroxyurea (HYDREA) 500 MG capsule Take 1 capsule (500 mg total) by mouth 3 (three) times daily before meals. 09/12/22   [name], MD  ipratropium (ATROVENT HFA) 17 MCG/ACT inhaler Inhale 2 puffs into the lungs every 6 (six) hours as needed for wheezing. 09/23/22   Milton Ferguson, MD  magnesium oxide (MAG-OX) 400 MG tablet Take 400 mg by mouth in the morning.    [provider]  methotrexate (RHEUMATREX) 2.5 MG tablet 6 tabs Orally weekly for 90    [provider]  montelukast (SINGULAIR) 10 MG tablet Take 1 tablet (10 mg total) by mouth at bedtime. 03/11/21   Collene Gobble, MD  montelukast (SINGULAIR) 10 MG tablet 1 tablet Orally Once a day for 30 day(s)    [provider]  Multiple Vitamins-Minerals (CENTRUM SILVER 50+WOMEN PO) Take 1 tablet by mouth in the morning.  [provider]  pantoprazole (PROTONIX) 20 MG tablet Take 20 mg by mouth 2 (two) times daily. 11/04/21   [provider]  rosuvastatin (CRESTOR) 10 MG tablet Take 10 mg by mouth at bedtime. 02/13/21   [provider]  vitamin B-12 (CYANOCOBALAMIN) 1000 MCG tablet Take 1,000 mcg by mouth in the morning and at bedtime.    [provider]      Allergies    Statins, Penicillins, and Pravastatin    Review of Systems   Review of Systems  Respiratory:  Positive for shortness of breath.     Physical Exam Updated Vital Signs BP 128/74   Pulse (!) 132   Temp  (!) 102.4 F (39.1 C) (Oral)   Resp (!) 26   Ht 1.549 m ('5\' 1"'$ )   Wt 74.4 kg   SpO2 97%   BMI 30.99 kg/m  Physical Exam Vitals and nursing note reviewed.  Constitutional:      Appearance: She is well-developed. She is ill-appearing.  HENT:     Head: Normocephalic and atraumatic.     Right Ear: External ear normal.     Left Ear: External ear normal.  Eyes:     General: No scleral icterus.       Right eye: No discharge.        Left eye: No discharge.     Conjunctiva/sclera: Conjunctivae normal.  Neck:     Trachea: No tracheal deviation.  Cardiovascular:     Rate and Rhythm: Regular rhythm. Tachycardia present.  Pulmonary:     Effort: Tachypnea and accessory muscle usage present. No respiratory distress.     Breath sounds: No stridor. Decreased breath sounds and wheezing present. No rales.  Abdominal:     General: Bowel sounds are normal. There is no distension.     Palpations: Abdomen is soft.     Tenderness: There is no abdominal tenderness. There is no guarding or rebound.  Musculoskeletal:        General: No tenderness or deformity.     Cervical back: Neck supple.     Right lower leg: No edema.     Left lower leg: No edema.  Skin:    General: Skin is warm and dry.     Findings: No rash.  Neurological:     General: No focal deficit present.     Mental Status: She is alert.     Cranial Nerves: No cranial nerve deficit, dysarthria or facial asymmetry.     Sensory: No sensory deficit.     Motor: No abnormal muscle tone or seizure activity.     Coordination: Coordination normal.  Psychiatric:        Mood and Affect: Mood normal.     ED Results / Procedures / Treatments   Labs (all labs ordered are listed, but only abnormal results are displayed) Labs Reviewed  RESP PANEL BY RT-PCR (RSV, FLU A&B, COVID)  [id number]  COMPREHENSIVE METABOLIC PANEL  CBC  BRAIN NATRIURETIC PEPTIDE    EKG EKG Interpretation  Date/Time:  Monday February 06 2023 13:25:12 EST Ventricular  Rate:  127 PR Interval:  155 QRS Duration: 80 QT Interval:  315 QTC Calculation: 458 R Axis:   206 Text Interpretation: Sinus tachycardia Consider right atrial enlargement Probable lateral infarct, age indeterminate Baseline wander in lead(s) II III V1 V5 Since last tracing rate faster Confirmed by Dorie Rank [phone]) on 02/06/2023 1:36:40 PM  Radiology No results found.  Procedures Procedures  {Document cardiac monitor, telemetry  assessment procedure when appropriate:1}  Medications Ordered in ED Medications  albuterol (VENTOLIN HFA) 108 (90 Base) MCG/ACT inhaler 2 puff (has no administration in time range)  methylPREDNISolone sodium succinate (SOLU-MEDROL) 125 mg/2 mL injection 125 mg (has no administration in time range)  albuterol (PROVENTIL,VENTOLIN) solution continuous neb (has no administration in time range)  ipratropium (ATROVENT) nebulizer solution 0.5 mg (has no administration in time range)  magnesium sulfate IVPB 2 g 50 mL (has no administration in time range)  acetaminophen (TYLENOL) tablet 650 mg (has no administration in time range)  sodium chloride 0.9 % bolus 500 mL (has no administration in time range)    Followed by  0.9 %  sodium chloride infusion (has no administration in time range)    ED Course/ Medical Decision Making/ A&P   {   Click here for ABCD2, HEART and other calculatorsREFRESH Note before signing :1}                          Medical Decision Making Amount and/or Complexity of Data Reviewed Labs: ordered. Radiology: ordered.  Risk OTC drugs. Prescription drug management.   ***  {Document critical care time when appropriate:1} {Document review of labs and clinical decision tools ie heart score, Chads2Vasc2 etc:1}  {Document your independent review of radiology images, and any outside records:1} {Document your discussion with family members, caretakers, and with consultants:1} {Document social determinants of health affecting pt's  care:1} {Document your decision making why or why not admission, treatments were needed:1} Final Clinical Impression(s) / ED Diagnoses Final diagnoses:  None    Rx / DC Orders ED Discharge Orders     None

## 2023-02-06 NOTE — Progress Notes (Signed)
RT NOTE:  Peak flow pre-treatment: 110  Peak flow post-treatment: 110  Good pt effort.  MD aware.

## 2023-02-06 NOTE — ED Triage Notes (Signed)
Patient has been feeling short of breath for 2 days. Tried a breathing treatment before arrival. History of asthma. Strong dry cough.

## 2023-02-07 LAB — CBC
HCT: 38.9 % (ref 36.0–46.0)
Hemoglobin: 12.3 g/dL (ref 12.0–15.0)
MCH: 25.5 pg — ABNORMAL LOW (ref 26.0–34.0)
MCHC: 31.6 g/dL (ref 30.0–36.0)
MCV: 80.7 fL (ref 80.0–100.0)
Platelets: 441 10*3/uL — ABNORMAL HIGH (ref 150–400)
RBC: 4.82 MIL/uL (ref 3.87–5.11)
RDW: 18.1 % — ABNORMAL HIGH (ref 11.5–15.5)
WBC: 8.1 10*3/uL (ref 4.0–10.5)
nRBC: 0 % (ref 0.0–0.2)

## 2023-02-07 LAB — COMPREHENSIVE METABOLIC PANEL
ALT: 15 U/L (ref 0–44)
AST: 23 U/L (ref 15–41)
Albumin: 3.3 g/dL — ABNORMAL LOW (ref 3.5–5.0)
Alkaline Phosphatase: 56 U/L (ref 38–126)
Anion gap: 9 (ref 5–15)
BUN: 14 mg/dL (ref 8–23)
CO2: 21 mmol/L — ABNORMAL LOW (ref 22–32)
Calcium: 8.5 mg/dL — ABNORMAL LOW (ref 8.9–10.3)
Chloride: 109 mmol/L (ref 98–111)
Creatinine, Ser: 0.88 mg/dL (ref 0.44–1.00)
GFR, Estimated: >60 mL/min (ref >60)
Glucose, Bld: 141 mg/dL — ABNORMAL HIGH (ref 70–99)
Potassium: 4.2 mmol/L (ref 3.5–5.1)
Sodium: 139 mmol/L (ref 135–145)
Total Bilirubin: 0.5 mg/dL (ref 0.3–1.2)
Total Protein: 6.2 g/dL — ABNORMAL LOW (ref 6.5–8.1)

## 2023-02-07 LAB — EXPECTORATED SPUTUM ASSESSMENT W GRAM STAIN, RFLX TO RESP C

## 2023-02-07 MED ORDER — PREDNISONE 20 MG PO TABS: 40.0000 mg | ORAL_TABLET | Freq: Every day | ORAL | Status: DC

## 2023-02-07 MED ORDER — SODIUM CHLORIDE 0.9 % IV SOLN: 1000.0000 mL | INTRAVENOUS | Status: DC

## 2023-02-07 NOTE — Progress Notes (Signed)
Peak flow measurements pre and post xopenex BD Pre: 110 Post: 110 Patient gives good effort but has extensive coughing with forced exhalations.

## 2023-02-07 NOTE — Progress Notes (Signed)
PROGRESS NOTE   [name]  [id number] DOB: 12/22/1945 DOA: 02/06/2023 PCP: Merrilee Seashore, MD  Brief Narrative:   77 year old black female Asthma with COPD overlap and vocal cord dysfunction-follows with Dr. Rodney Cruise 87% predicted-Per his note she has managing chronic cough and stridor There is some element of underlying mild interstitial disease and moderate persistent asthma per office notes--last seen OV by rheum 08/25/2022 Rheumatoid arthritis on methotrexate--- the patient does follow-up with Dr. Ashby Dawes for her rheumatoid arthritis and has been on methotrexate "for a long time" Prior CVA Prior adenomatous polyps followed by Dr. Henrene Pastor with family history of colon cancer age 18 HLD DM TY 2 Most recent admit 09/03/2021-->09/06/2021 asthma with COPD overlap and ILD   She describes having cough cold fever for the past several days and feeling worse and worse-she has no sick contacts at home lives with the daughter and no small children no recent travel no exotic pets--She tells me she was diagnosed by using a PFT machine after I explained this in layman terms.  Hospital-Problem based course  Acute asthma exacerbation from metapneumovirus/COPD overlap Vocal cord dysfunction as documented by pulmonary Mild interstitial lung disease 2/2 methotrexate lung/rheumatoid - Peak flows with inhalers-still wheezing-not ready to discharge - Xopenex, switch Solu-Medrol to prednisone 40 mg - Follow sputum culture to completion (growing gram-positive cocci gram-positive rods) Blood culture pending  Fever in the setting of rheumatoid arthritis - Continue methotrexate as per home schedule of once per week - Steroids for asthma/COPD should blunt response of this? - Patient has no joint pain no other symptoms at this time - Continue ASA 81 daily  Sickle cell trait on electrophoresis in 2020 - Probably does not need Hydrea? - Defer to PCP  DM TY 2 - Not on home meds - Probably  diet controlled continue gabapentin 100 at bedtime  DVT prophylaxis: Lovenox Code Status: Full Family Communication: None Disposition:  Status is: Inpatient Remains inpatient appropriate because:   Needs to improve some prior to discharge  Subjective: Awake coherent pleasant still wheezing-up to the bathroom felt winded Not on oxygen however Some sputum no fever no chills no nausea no vomiting no diarrhea no chest pain  Objective: Vitals:   02/07/23 0558 02/07/23 0729 02/07/23 0851 02/07/23 1002  BP: 126/69 112/77  127/68  Pulse: 90 89  92  Resp:  18  19  Temp: 98.1 F (36.7 C) 97.6 F (36.4 C)  97.7 F (36.5 C)  TempSrc: Oral Oral    SpO2: 100%  100% 100%  Weight: 74.5 kg     Height:        Intake/Output Summary (Last 24 hours) at 02/07/2023 1225 Last data filed at 02/07/2023 1037 Gross per 24 hour  Intake 1522.94 ml  Output 100 ml  Net 1422.94 ml   Filed Weights   02/06/23 1324 02/06/23 2209 02/07/23 0558  Weight: 74.4 kg 74.5 kg 74.5 kg    Examination:  EOMI NCAT no focal deficit Arcus senilis present No icterus no pallor Wheeze posterolaterally on the right side and anteriorly in the lung fields-some transmitted sounds from upper airway No lower extremity edema Abdomen soft no rebound Moving 4 limbs equally rest of neuroexam deferred  Data Reviewed: personally reviewed   CBC    Component Value Date/Time   WBC 8.1 02/07/2023 0427   RBC 4.82 02/07/2023 0427   HGB 12.3 02/07/2023 0427   HGB 12.3 01/05/2022 0741   HGB 12.4 11/24/2017 0754   HCT 38.9 02/07/2023 0427  HCT 37.8 11/24/2017 0754   PLT 441 (H) 02/07/2023 0427   PLT 454 (H) 01/05/2022 0741   PLT 786 (H) 11/24/2017 0754   MCV 80.7 02/07/2023 0427   MCV 75 (L) 11/24/2017 0754   MCH 25.5 (L) 02/07/2023 0427   MCHC 31.6 02/07/2023 0427   RDW 18.1 (H) 02/07/2023 0427   RDW 16.7 (H) 11/24/2017 0754   LYMPHSABS 1.3 09/23/2022 1031   LYMPHSABS 1.9 11/24/2017 0754   MONOABS 0.9 09/23/2022  1031   EOSABS 0.0 09/23/2022 1031   EOSABS 0.2 11/24/2017 0754   BASOSABS 0.0 09/23/2022 1031   BASOSABS 0.1 11/24/2017 0754      Latest Ref Rng & Units 02/07/2023    4:27 AM 02/06/2023    1:33 PM 09/23/2022   11:00 AM  CMP  Glucose 70 - 99 mg/dL 141  87  124   BUN 8 - 23 mg/dL '14  17  10   '$ Creatinine 0.44 - 1.00 mg/dL 0.88  1.05  0.77   Sodium 135 - 145 mmol/L 139  136  135   Potassium 3.5 - 5.1 mmol/L 4.2  3.3  3.4   Chloride 98 - 111 mmol/L 109  105  105   CO2 22 - 32 mmol/L '21  22  23   '$ Calcium 8.9 - 10.3 mg/dL 8.5  8.6  8.7   Total Protein 6.5 - 8.1 g/dL 6.2  6.3  6.3   Total Bilirubin 0.3 - 1.2 mg/dL 0.5  0.4  0.7   Alkaline Phos 38 - 126 U/L 56  57  64   AST 15 - 41 U/L '23  21  19   '$ ALT 0 - 44 U/L '15  14  14      '$ Radiology Studies: DG Chest Port 1 View  Result Date: 02/06/2023 CLINICAL DATA:  Shortness of breath EXAM: PORTABLE CHEST 1 VIEW COMPARISON:  Chest radiograph dated 09/23/2022 FINDINGS: Normal lung volumes. Rounded retrocardiac density in keeping with known hiatal hernia. No pleural effusion or pneumothorax. The heart size and mediastinal contours are within normal limits. Degenerative changes of the bilateral glenohumeral joints. IMPRESSION: 1. No active disease. 2. Small hiatal hernia. Electronically Signed   By: Darrin Nipper M.D.   On: 02/06/2023 14:33     Scheduled Meds:  aspirin EC  81 mg Oral Daily   azithromycin  500 mg Oral Daily   cyanocobalamin  1,000 mcg Oral Daily   enoxaparin (LOVENOX) injection  40 mg Subcutaneous Q24H   fluticasone furoate-vilanterol  1 puff Inhalation Daily   folic acid  [id number] mcg Oral Daily   gabapentin  100 mg Oral QHS   guaiFENesin  600 mg Oral BID   hydroxyurea  500 mg Oral TID AC   levalbuterol  0.63 mg Nebulization BID   magnesium oxide  400 mg Oral Daily   methylPREDNISolone (SOLU-MEDROL) injection  40 mg Intravenous Q12H   montelukast  10 mg Oral QHS   pantoprazole  20 mg Oral BID   Continuous Infusions:  sodium  chloride 1,000 mL (02/07/23 0140)     LOS: 1 day   Time spent: 68  Nita Sells, MD [facility] To contact the attending provider between 7A-7P or the covering provider during after hours 7P-7A, please log into the web site [email or web address] and access using universal [place] password for that web site. If you do not have the password, please call the hospital operator.  02/07/2023, 12:25 PM

## 2023-02-08 ENCOUNTER — Inpatient Hospital Stay (HOSPITAL_COMMUNITY): Payer: Medicare Other

## 2023-02-08 DIAGNOSIS — J45901 Unspecified asthma with (acute) exacerbation: Secondary | ICD-10-CM

## 2023-02-08 LAB — CBC
HCT: 37.1 % (ref 36.0–46.0)
Hemoglobin: 11.7 g/dL — ABNORMAL LOW (ref 12.0–15.0)
MCH: 25.1 pg — ABNORMAL LOW (ref 26.0–34.0)
MCHC: 31.5 g/dL (ref 30.0–36.0)
MCV: 79.4 fL — ABNORMAL LOW (ref 80.0–100.0)
Platelets: 430 10*3/uL — ABNORMAL HIGH (ref 150–400)
RBC: 4.67 MIL/uL (ref 3.87–5.11)
RDW: 17.6 % — ABNORMAL HIGH (ref 11.5–15.5)
WBC: 8.7 10*3/uL (ref 4.0–10.5)
nRBC: 0 % (ref 0.0–0.2)

## 2023-02-08 LAB — COMPREHENSIVE METABOLIC PANEL
ALT: 16 U/L (ref 0–44)
AST: 21 U/L (ref 15–41)
Albumin: 3 g/dL — ABNORMAL LOW (ref 3.5–5.0)
Alkaline Phosphatase: 46 U/L (ref 38–126)
Anion gap: 5 (ref 5–15)
BUN: 15 mg/dL (ref 8–23)
CO2: 24 mmol/L (ref 22–32)
Calcium: 8.1 mg/dL — ABNORMAL LOW (ref 8.9–10.3)
Chloride: 110 mmol/L (ref 98–111)
Creatinine, Ser: 0.89 mg/dL (ref 0.44–1.00)
GFR, Estimated: >60 mL/min (ref >60)
Glucose, Bld: 72 mg/dL (ref 70–99)
Potassium: 3.1 mmol/L — ABNORMAL LOW (ref 3.5–5.1)
Sodium: 139 mmol/L (ref 135–145)
Total Bilirubin: 0.5 mg/dL (ref 0.3–1.2)
Total Protein: 5.7 g/dL — ABNORMAL LOW (ref 6.5–8.1)

## 2023-02-08 LAB — GLUCOSE, CAPILLARY
Glucose-Capillary: 232 mg/dL — ABNORMAL HIGH (ref 70–99)
Glucose-Capillary: 200 mg/dL — ABNORMAL HIGH (ref 70–99)

## 2023-02-08 MED ORDER — INSULIN ASPART 100 UNIT/ML IJ SOLN: 0.0000 [IU] | Freq: Every day | INTRAMUSCULAR | Status: DC

## 2023-02-08 MED ORDER — GUAIFENESIN ER 600 MG PO TB12
1200.0000 mg | ORAL_TABLET | Freq: Two times a day (BID) | ORAL | Status: DC
  Administered 2023-02-08 – 2023-02-11 (×7): 1200 mg via ORAL
  Filled 2023-02-08 (×7): qty 2

## 2023-02-08 MED ORDER — LEVALBUTEROL HCL 0.63 MG/3ML IN NEBU: 0.6300 mg | INHALATION_SOLUTION | Freq: Four times a day (QID) | RESPIRATORY_TRACT | Status: DC | PRN

## 2023-02-08 MED ORDER — BUDESONIDE 0.25 MG/2ML IN SUSP
0.2500 mg | Freq: Two times a day (BID) | RESPIRATORY_TRACT | Status: DC
  Administered 2023-02-08 – 2023-02-11 (×7): 0.25 mg via RESPIRATORY_TRACT
  Filled 2023-02-08 (×6): qty 2

## 2023-02-08 MED ORDER — MAGNESIUM SULFATE 2 GM/50ML IV SOLN
2.0000 g | Freq: Once | INTRAVENOUS | Status: AC
  Administered 2023-02-08: 2 g via INTRAVENOUS
  Filled 2023-02-08: qty 50

## 2023-02-08 MED ORDER — ARFORMOTEROL TARTRATE 15 MCG/2ML IN NEBU
15.0000 ug | INHALATION_SOLUTION | Freq: Two times a day (BID) | RESPIRATORY_TRACT | Status: DC
  Administered 2023-02-08 – 2023-02-11 (×7): 15 ug via RESPIRATORY_TRACT
  Filled 2023-02-08 (×6): qty 2

## 2023-02-08 MED ORDER — METHYLPREDNISOLONE SODIUM SUCC 125 MG IJ SOLR
60.0000 mg | Freq: Two times a day (BID) | INTRAMUSCULAR | Status: DC
  Administered 2023-02-08 – 2023-02-10 (×5): 60 mg via INTRAVENOUS
  Filled 2023-02-08 (×5): qty 2

## 2023-02-08 MED ORDER — IPRATROPIUM BROMIDE 0.02 % IN SOLN
0.5000 mg | Freq: Four times a day (QID) | RESPIRATORY_TRACT | Status: DC
  Administered 2023-02-08 – 2023-02-11 (×14): 0.5 mg via RESPIRATORY_TRACT
  Filled 2023-02-08 (×14): qty 2.5

## 2023-02-08 MED ORDER — POTASSIUM CHLORIDE CRYS ER 20 MEQ PO TBCR
40.0000 meq | EXTENDED_RELEASE_TABLET | Freq: Once | ORAL | Status: AC
  Administered 2023-02-08: 40 meq via ORAL
  Filled 2023-02-08: qty 2

## 2023-02-08 MED ORDER — LEVALBUTEROL HCL 0.63 MG/3ML IN NEBU
0.6300 mg | INHALATION_SOLUTION | Freq: Four times a day (QID) | RESPIRATORY_TRACT | Status: DC
  Administered 2023-02-08 – 2023-02-11 (×14): 0.63 mg via RESPIRATORY_TRACT
  Filled 2023-02-08 (×14): qty 3

## 2023-02-08 MED ORDER — INSULIN ASPART 100 UNIT/ML IJ SOLN
0.0000 [IU] | Freq: Three times a day (TID) | INTRAMUSCULAR | Status: DC
  Administered 2023-02-08 – 2023-02-09 (×2): 2 [IU] via SUBCUTANEOUS
  Administered 2023-02-09 – 2023-02-10 (×2): 1 [IU] via SUBCUTANEOUS
  Administered 2023-02-10: 2 [IU] via SUBCUTANEOUS

## 2023-02-08 MED ORDER — SODIUM CHLORIDE 0.9 % IV SOLN
2.0000 g | INTRAVENOUS | Status: DC
  Administered 2023-02-08 – 2023-02-10 (×3): 2 g via INTRAVENOUS
  Filled 2023-02-08 (×3): qty 20

## 2023-02-08 NOTE — Progress Notes (Signed)
   02/08/23 [id number]  Charting Type  Charting Type Shift Assessment  Respiratory  Respiratory Pattern Labored;Dyspnea with exertion;Dyspnea at rest;Symmetrical;Pursed lips  Chest Assessment Chest expansion symmetrical  Bilateral Breath Sounds Expiratory wheezes;Inspiratory wheezes;Rales  R Upper  Breath Sounds Expiratory wheezes;Inspiratory wheezes;Rales  ECG Monitoring  ECG Heart Rate 100  Neurological  Level of Consciousness Alert      [facility] called, regarding respiratory distress and the need for new orders.

## 2023-02-08 NOTE — Progress Notes (Signed)
Patient experiencing sudden onset of dyspnea and shortness of breath. Pt O2 at 78 with HR at 128. RN at bedside to assess pt. RN instructed pt on how to perform breathing exercises. Pt 02 back up to 98 with HR 94 without 02 supplementation. Lung sounds extremely coarse with inspiratory and expiratory wheezing. Respiratory called to assess and provide breathing tx. RT at bedside.

## 2023-02-08 NOTE — Progress Notes (Signed)
Called to patient room for complaints of dyspnea and increased wob. Significant wheezing on exam. Strong NPC with congestion. BD given with 1.'25mg'$  xopenex. RT protocol completed. Orders changed accordingly.

## 2023-02-08 NOTE — Progress Notes (Addendum)
PROGRESS NOTE    [name]  [id number] DOB: 02-21-1946 DOA: 02/06/2023 PCP: Merrilee Seashore, MD   Brief Narrative: 77 year old with past medical history significant for asthma with COPD overlap, vocal cord dysfunction, follow-up with Dr. Lamonte Sakai, rheumatoid arthritis on methotrexate, prior CVA, diabetes type 2, some concern for ILD presents with with worsening cough, shortness of breath.   Assessment & Plan:   Principal Problem:   COPD (chronic obstructive pulmonary disease) (HCC) Active Problems:   Diabetes ([place])   Vocal cord dysfunction   Acute respiratory distress   Asthma-COPD overlap syndrome  1-Acute COPD, Asthma exacerbation in the setting of metapneumovirus -Patient developed worsening respiratory distress and shortness of breath overnight, bilateral expiratory wheezing. -Resume IV Solu-Medrol, will give IV magnesium. -DuoNeb every 6 hours, extra Xopenex will be given. -Start Brovana and Pulmicort, hold Breo-Ellipta.  -Repeated chest x ray : no acute cardiopulmonary abnormalities.  After patient received extra nebulizer, IV solumedrol, IV magnesium she has improved significantly.  -Continue with Azithromycin. Will add IV ceftriaxone/, sputum growing streptococcus Pneumonia.  -Increase Mucinex to 1200 ,g BID.  Flutter valve.   Fever;  Chest x ray negative.  In setting metapneumovirus.  Sputum culture abundant streptococcus pneumonia. Will start IV ceftriaxone.   Sickle cell trait On Hydrea ? Will defer to PCP.   DM Type 2; Diet controlled.  SSI while on steroids.   RA; on methotrexate weekly.  Hypokalemia; replete orally.       Estimated body mass index is 31.03 kg/m as calculated from the following:   Height as of this encounter: '5\' 1"'$  (1.549 m).   Weight as of this encounter: 74.5 kg.   DVT prophylaxis: Lovenox Code Status: Full code Family Communication: Care discussed with patient. Granddaughter at bedside.  Disposition Plan:  Status  is: Inpatient Remains inpatient appropriate because: Respiratory distress.     Consultants:  none  Procedures:    Antimicrobials:    Subjective: She is alert. She was resp distress this am, BL expiratory wheezing, not feeling well.    Objective: Vitals:   02/08/23 1230 02/08/23 1231 02/08/23 1349 02/08/23 1400  BP: (!) 146/80 (!) 146/80    Pulse:  79  84  Resp: '15 14  17  '$ Temp:  98.4 F (36.9 C)    TempSrc:  Oral    SpO2:  98% 98% 100%  Weight:      Height:        Intake/Output Summary (Last 24 hours) at 02/08/2023 1456 Last data filed at 02/08/2023 1451 Gross per 24 hour  Intake 655.52 ml  Output 2550 ml  Net -1894.48 ml   Filed Weights   02/06/23 1324 02/06/23 2209 02/07/23 0558  Weight: 74.4 kg 74.5 kg 74.5 kg    Examination:  General exam: Appears calm and comfortable  Respiratory system: BL expiratory wheezing, BL ronchus.  Cardiovascular system: S1 & S2 heard, RRR.  Gastrointestinal system: Abdomen is nondistended, soft and nontender. No organomegaly or masses felt. Normal bowel sounds heard. Central nervous system: Alert and oriented.  Extremities: Symmetric 5 x 5 power.    Data Reviewed: I have personally reviewed following labs and imaging studies  CBC: Recent Labs  Lab 02/06/23 1333 02/07/23 0427 02/08/23 0436  WBC 8.8 8.1 8.7  HGB 12.8 12.3 11.7*  HCT 40.0 38.9 37.1  MCV 78.9* 80.7 79.4*  PLT 564* 441* [id number]*   Basic Metabolic Panel: Recent Labs  Lab 02/06/23 1333 02/07/23 0427 02/08/23 0436  NA 136 139 139  K  3.3* 4.2 3.1*  CL 105 109 110  CO2 22 21* 24  GLUCOSE 87 141* 72  BUN '17 14 15  '$ CREATININE 1.05* 0.88 0.89  CALCIUM 8.6* 8.5* 8.1*   GFR: Estimated Creatinine Clearance: 48.9 mL/min (by C-G formula based on SCr of 0.89 mg/dL). Liver Function Tests: Recent Labs  Lab 02/06/23 1333 02/07/23 0427 02/08/23 0436  AST '21 23 21  '$ ALT '14 15 16  '$ ALKPHOS 57 56 46  BILITOT 0.4 0.5 0.5  PROT 6.3* 6.2* 5.7*  ALBUMIN 3.7 3.3*  3.0*   No results for input(s): "LIPASE", "AMYLASE" in the last 168 hours. No results for input(s): "AMMONIA" in the last 168 hours. Coagulation Profile: No results for input(s): "INR", "PROTIME" in the last 168 hours. Cardiac Enzymes: No results for input(s): "CKTOTAL", "CKMB", "CKMBINDEX", "TROPONINI" in the last 168 hours. BNP (last 3 results) No results for input(s): "PROBNP" in the last 8760 hours. HbA1C: No results for input(s): "HGBA1C" in the last 72 hours. CBG: No results for input(s): "GLUCAP" in the last 168 hours. Lipid Profile: No results for input(s): "CHOL", "HDL", "LDLCALC", "TRIG", "CHOLHDL", "LDLDIRECT" in the last 72 hours. Thyroid Function Tests: No results for input(s): "TSH", "T4TOTAL", "FREET4", "T3FREE", "THYROIDAB" in the last 72 hours. Anemia Panel: No results for input(s): "VITAMINB12", "FOLATE", "FERRITIN", "TIBC", "IRON", "RETICCTPCT" in the last 72 hours. Sepsis Labs: Recent Labs  Lab 02/06/23 1333  LATICACIDVEN 1.7    Recent Results (from the past 240 hour(s))  Blood culture (routine x 2)     Status: None (Preliminary result)   Collection Time: 02/06/23  1:33 PM   Specimen: BLOOD RIGHT WRIST  Result Value Ref Range Status   Specimen Description   Final    BLOOD RIGHT WRIST Performed at [facility] [address], [place], [place] [address]    Special Requests   Final    BOTTLES DRAWN AEROBIC AND ANAEROBIC Blood Culture results may not be optimal due to an excessive volume of blood received in culture bottles Performed at [facility] [address], [place], [place] [address]    Culture   Final    NO GROWTH 2 DAYS Performed at [facility] [address] [place], [place] [address]    Report Status PENDING  Incomplete  Resp panel by RT-PCR (RSV, Flu A&B, Covid) Anterior Nasal Swab     Status: None   Collection Time: 02/06/23  2:20 PM   Specimen: Anterior Nasal Swab  Result Value Ref Range Status   SARS  Coronavirus 2 by RT PCR NEGATIVE NEGATIVE Final    Comment: (NOTE) SARS-CoV-2 target nucleic acids are NOT DETECTED.  The SARS-CoV-2 RNA is generally detectable in upper respiratory specimens during the acute phase of infection. The lowest concentration of SARS-CoV-2 viral copies this assay can detect is 138 copies/mL. A negative result does not preclude SARS-Cov-2 infection and should not be used as the sole basis for treatment or other patient management decisions. A negative result may occur with  improper specimen collection/handling, submission of specimen other than nasopharyngeal swab, presence of viral mutation(s) within the areas targeted by this assay, and inadequate number of viral copies(<138 copies/mL). A negative result must be combined with clinical observations, patient history, and epidemiological information. The expected result is Negative.  Fact Sheet for Patients:  EntrepreneurPulse.com.au  Fact Sheet for Healthcare Providers:  IncredibleEmployment.be  This test is no t yet approved or cleared by the [place] FDA and  has been  authorized for detection and/or diagnosis of SARS-CoV-2 by FDA under an Emergency Use Authorization (EUA). This EUA will remain  in effect (meaning this test can be used) for the duration of the COVID-19 declaration under Section 564(b)(1) of the Act, 21 U.S.C.section 360bbb-3(b)(1), unless the authorization is terminated  or revoked sooner.       Influenza A by PCR NEGATIVE NEGATIVE Final   Influenza B by PCR NEGATIVE NEGATIVE Final    Comment: (NOTE) The Xpert Xpress SARS-CoV-2/FLU/RSV plus assay is intended as an aid in the diagnosis of influenza from Nasopharyngeal swab specimens and should not be used as a sole basis for treatment. Nasal washings and aspirates are unacceptable for Xpert Xpress SARS-CoV-2/FLU/RSV testing.  Fact Sheet for  Patients: EntrepreneurPulse.com.au  Fact Sheet for Healthcare Providers: IncredibleEmployment.be  This test is not yet approved or cleared by the [place] FDA and has been authorized for detection and/or diagnosis of SARS-CoV-2 by FDA under an Emergency Use Authorization (EUA). This EUA will remain in effect (meaning this test can be used) for the duration of the COVID-19 declaration under Section 564(b)(1) of the Act, 21 U.S.C. section 360bbb-3(b)(1), unless the authorization is terminated or revoked.     Resp Syncytial Virus by PCR NEGATIVE NEGATIVE Final    Comment: (NOTE) Fact Sheet for Patients: EntrepreneurPulse.com.au  Fact Sheet for Healthcare Providers: IncredibleEmployment.be  This test is not yet approved or cleared by the [place] FDA and has been authorized for detection and/or diagnosis of SARS-CoV-2 by FDA under an Emergency Use Authorization (EUA). This EUA will remain in effect (meaning this test can be used) for the duration of the COVID-19 declaration under Section 564(b)(1) of the Act, 21 U.S.C. section 360bbb-3(b)(1), unless the authorization is terminated or revoked.  Performed at [facility], [place] [address], [place], [place] [address]   Blood culture (routine x 2)     Status: None (Preliminary result)   Collection Time: 02/06/23  2:20 PM   Specimen: BLOOD LEFT FOREARM  Result Value Ref Range Status   Specimen Description   Final    BLOOD LEFT FOREARM Performed at [facility], [place] [address] [place], [place] [address]    Special Requests   Final    BOTTLES DRAWN AEROBIC AND ANAEROBIC Blood Culture results may not be optimal due to an inadequate volume of blood received in culture bottles Performed at [facility] [address] [place], [place] [address]    Culture   Final    NO GROWTH 2 DAYS Performed at [facility] [address] [place], [place] [address]    Report Status PENDING  Incomplete  Respiratory (~20 pathogens) panel by PCR     Status: Abnormal   Collection Time: 02/06/23  4:08 PM   Specimen: Nasopharyngeal Swab; Respiratory  Result Value Ref Range Status   Adenovirus NOT DETECTED NOT DETECTED Final   Coronavirus 229E NOT DETECTED NOT DETECTED Final    Comment: (NOTE) The Coronavirus on the Respiratory Panel, DOES NOT test for the novel  Coronavirus (2019 nCoV)    Coronavirus HKU1 NOT DETECTED NOT DETECTED Final   Coronavirus NL63 NOT DETECTED NOT DETECTED Final   Coronavirus OC43 NOT DETECTED NOT DETECTED Final   Metapneumovirus DETECTED (A) NOT DETECTED Final   Rhinovirus / Enterovirus NOT DETECTED NOT DETECTED Final   Influenza A NOT DETECTED NOT DETECTED Final   Influenza B NOT DETECTED NOT DETECTED Final   Parainfluenza Virus 1 NOT  DETECTED NOT DETECTED Final   Parainfluenza Virus 2 NOT DETECTED NOT DETECTED Final   Parainfluenza Virus 3 NOT DETECTED NOT DETECTED Final   Parainfluenza Virus 4 NOT DETECTED NOT DETECTED Final   Respiratory Syncytial Virus NOT DETECTED NOT DETECTED Final   Bordetella pertussis NOT DETECTED NOT DETECTED Final   Bordetella Parapertussis NOT DETECTED NOT DETECTED Final   Chlamydophila pneumoniae NOT DETECTED NOT DETECTED Final   Mycoplasma pneumoniae NOT DETECTED NOT DETECTED Final    Comment: Performed at [facility], [place] [address], [place], [place] [address]  Expectorated Sputum Assessment w Gram Stain, Rflx to Resp Cult     Status: None   Collection Time: 02/07/23  1:34 AM   Specimen: Expectorated Sputum  Result Value Ref Range Status   Specimen Description EXPECTORATED SPUTUM  Final   Special Requests NONE  Final   Sputum evaluation   Final    THIS SPECIMEN IS ACCEPTABLE FOR SPUTUM CULTURE Performed at [facility], [place] [address], [place], [place] [address]    Report Status 02/07/2023 FINAL  Final   Culture, Respiratory w Gram Stain     Status: None (Preliminary result)   Collection Time: 02/07/23  1:34 AM  Result Value Ref Range Status   Specimen Description   Final    EXPECTORATED SPUTUM Performed at [facility] [address], [place], [place] [address]    Special Requests   Final    NONE Reflexed from [id number] Performed at [facility], [place] [address], [place], [place] [address]    Gram Stain   Final    FEW WBC PRESENT, PREDOMINANTLY MONONUCLEAR FEW GRAM POSITIVE COCCI IN PAIRS RARE GRAM POSITIVE RODS    Culture   Final    ABUNDANT STREPTOCOCCUS PNEUMONIAE CULTURE REINCUBATED FOR BETTER GROWTH Performed at [facility], [place] [address], [place], [place] [address]    Report Status PENDING  Incomplete         Radiology Studies: DG CHEST PORT 1 VIEW  Result Date: 02/08/2023 CLINICAL DATA:  77 year old female with shortness of breath. EXAM: PORTABLE CHEST 1 VIEW COMPARISON:  Portable chest 02/06/2023 and earlier. FINDINGS: Portable AP semi upright view at 0702 hours. Lung volumes remain within normal limits. Small gastric hiatal hernia again noted. Other mediastinal contours are within normal limits. Allowing for portable technique the lungs are clear. No pneumothorax or pleural effusions. Visualized tracheal air column is within normal limits. Paucity of bowel gas in the visible abdomen. Stable visualized osseous structures. IMPRESSION: 1. No acute cardiopulmonary abnormality. 2. Small hiatal hernia. Electronically Signed   By: Genevie Ann M.D.   On: 02/08/2023 07:56        Scheduled Meds:  arformoterol  15 mcg Nebulization BID   aspirin EC  81 mg Oral Daily   azithromycin  500 mg Oral Daily   budesonide (PULMICORT) nebulizer solution  0.25 mg Nebulization BID   cyanocobalamin  1,000 mcg Oral Daily   enoxaparin (LOVENOX) injection  40 mg Subcutaneous [id number]   folic acid  [id number] mcg Oral Daily   gabapentin  100 mg Oral QHS    guaiFENesin  1,200 mg Oral BID   hydroxyurea  500 mg Oral TID AC   insulin aspart  0-5 Units Subcutaneous QHS   insulin aspart  0-9 Units Subcutaneous TID WC   ipratropium  0.5 mg Nebulization Q6H   levalbuterol  0.63 mg Nebulization Q6H   magnesium oxide  400 mg Oral Daily  methylPREDNISolone (SOLU-MEDROL) injection  60 mg Intravenous Q12H   montelukast  10 mg Oral QHS   pantoprazole  20 mg Oral BID   Continuous Infusions:     LOS: 2 days    Time spent: 35 minutes    Shalissa Easterwood A Sayed Apostol, MD [facility]   If 7PM-7AM, please contact night-coverage [email or web address]  02/08/2023, 2:56 PM

## 2023-02-09 LAB — GLUCOSE, CAPILLARY
Glucose-Capillary: 131 mg/dL — ABNORMAL HIGH (ref 70–99)
Glucose-Capillary: 89 mg/dL (ref 70–99)
Glucose-Capillary: 156 mg/dL — ABNORMAL HIGH (ref 70–99)
Glucose-Capillary: 169 mg/dL — ABNORMAL HIGH (ref 70–99)

## 2023-02-09 LAB — COMPREHENSIVE METABOLIC PANEL
ALT: 17 U/L (ref 0–44)
AST: 28 U/L (ref 15–41)
Albumin: 3.2 g/dL — ABNORMAL LOW (ref 3.5–5.0)
Alkaline Phosphatase: 48 U/L (ref 38–126)
Anion gap: 7 (ref 5–15)
BUN: 16 mg/dL (ref 8–23)
CO2: 24 mmol/L (ref 22–32)
Calcium: 8.4 mg/dL — ABNORMAL LOW (ref 8.9–10.3)
Chloride: 106 mmol/L (ref 98–111)
Creatinine, Ser: 0.91 mg/dL (ref 0.44–1.00)
GFR, Estimated: >60 mL/min (ref >60)
Glucose, Bld: 146 mg/dL — ABNORMAL HIGH (ref 70–99)
Potassium: 5.2 mmol/L — ABNORMAL HIGH (ref 3.5–5.1)
Sodium: 137 mmol/L (ref 135–145)
Total Bilirubin: 0.6 mg/dL (ref 0.3–1.2)
Total Protein: 6.3 g/dL — ABNORMAL LOW (ref 6.5–8.1)

## 2023-02-09 LAB — CBC
HCT: 39 % (ref 36.0–46.0)
Hemoglobin: 12.3 g/dL (ref 12.0–15.0)
MCH: 25.4 pg — ABNORMAL LOW (ref 26.0–34.0)
MCHC: 31.5 g/dL (ref 30.0–36.0)
MCV: 80.6 fL (ref 80.0–100.0)
Platelets: 447 10*3/uL — ABNORMAL HIGH (ref 150–400)
RBC: 4.84 MIL/uL (ref 3.87–5.11)
RDW: 18 % — ABNORMAL HIGH (ref 11.5–15.5)
WBC: 12.6 10*3/uL — ABNORMAL HIGH (ref 4.0–10.5)
nRBC: 0 % (ref 0.0–0.2)

## 2023-02-09 MED ORDER — MAGNESIUM SULFATE 2 GM/50ML IV SOLN
2.0000 g | Freq: Once | INTRAVENOUS | Status: AC
  Administered 2023-02-09: 2 g via INTRAVENOUS
  Filled 2023-02-09: qty 50

## 2023-02-09 NOTE — Progress Notes (Signed)
PROGRESS NOTE    [name]  [id number] DOB: Dec 12, 1945 DOA: 02/06/2023 PCP: Merrilee Seashore, MD   Brief Narrative: 77 year old with past medical history significant for asthma with COPD overlap, vocal cord dysfunction, follow-up with Dr. Lamonte Sakai, rheumatoid arthritis on methotrexate, prior CVA, diabetes type 2, some concern for ILD presents with with worsening cough, shortness of breath.   Assessment & Plan:   Principal Problem:   COPD (chronic obstructive pulmonary disease) (HCC) Active Problems:   Diabetes ([place])   Vocal cord dysfunction   Acute respiratory distress   Asthma-COPD overlap syndrome  1-Acute COPD, Asthma exacerbation in the setting of metapneumovirus -Patient developed worsening respiratory distress and shortness of breath overnight, bilateral expiratory wheezing 3/06. -Continue with  IV Solu-Medrol, repeat IV magnesium.  -DuoNeb every 6 hours.  -Continue with  Brovana and Pulmicort, hold Breo-Ellipta.  -Repeated chest x ray : no acute cardiopulmonary abnormalities.  -Continue with Azithromycin,  IV ceftriaxone/, sputum growing streptococcus Pneumonia.  -Continue with  Mucinex to 1200 ,g BID.  -Flutter valve.  She report improvement of dyspnea, still have BL ronchus.   Fever;  Chest x ray negative.  In setting metapneumovirus.  Sputum culture abundant streptococcus pneumonia. Continue with IV ceftriaxone.   Sickle cell trait On Hydrea ? Will defer to PCP.   DM Type 2; Diet controlled.  SSI while on steroids.   RA; on methotrexate weekly.  Hypokalemia; Replaced. K at 5.2 Repeat in am.       Estimated body mass index is 31.03 kg/m as calculated from the following:   Height as of this encounter: '5\' 1"'$  (1.549 m).   Weight as of this encounter: 74.5 kg.   DVT prophylaxis: Lovenox Code Status: Full code Family Communication: Care discussed with patient. Granddaughter at bedside 3/06.  Disposition Plan:  Status is: Inpatient Remains  inpatient appropriate because: Respiratory distress.     Consultants:  none  Procedures:    Antimicrobials:    Subjective: She is feeling better today, breathing better. Not at baseline.   Objective: Vitals:   02/09/23 0033 02/09/23 0602 02/09/23 0803 02/09/23 1204  BP: 130/83 (!) 145/77  (!) 141/79  Pulse:  91    Resp:  15    Temp: 97.9 F (36.6 C) 97.9 F (36.6 C)  98.1 F (36.7 C)  TempSrc: Oral Oral  Oral  SpO2:  99% 100%   Weight:      Height:        Intake/Output Summary (Last 24 hours) at 02/09/2023 1319 Last data filed at 02/09/2023 1039 Gross per 24 hour  Intake 360 ml  Output 500 ml  Net -140 ml    Filed Weights   02/06/23 1324 02/06/23 2209 02/07/23 0558  Weight: 74.4 kg 74.5 kg 74.5 kg    Examination:  General exam: NAD Respiratory system: BL ronchus.  Cardiovascular system: S 1, S 2 RRR  Gastrointestinal system: BS present, soft, nt Central nervous system: Alert, oriented.  Extremities: no edema.     Data Reviewed: I have personally reviewed following labs and imaging studies  CBC: Recent Labs  Lab 02/06/23 1333 02/07/23 0427 02/08/23 0436 02/09/23 0434  WBC 8.8 8.1 8.7 12.6*  HGB 12.8 12.3 11.7* 12.3  HCT 40.0 38.9 37.1 39.0  MCV 78.9* 80.7 79.4* 80.6  PLT 564* 441* 430* 447*    Basic Metabolic Panel: Recent Labs  Lab 02/06/23 1333 02/07/23 0427 02/08/23 0436 02/09/23 0434  NA 136 139 139 137  K 3.3* 4.2 3.1* 5.2*  CL 105 109 110 106  CO2 22 21* 24 24  GLUCOSE 87 141* 72 146*  BUN '17 14 15 16  '$ CREATININE 1.05* 0.88 0.89 0.91  CALCIUM 8.6* 8.5* 8.1* 8.4*    GFR: Estimated Creatinine Clearance: 47.8 mL/min (by C-G formula based on SCr of 0.91 mg/dL). Liver Function Tests: Recent Labs  Lab 02/06/23 1333 02/07/23 0427 02/08/23 0436 02/09/23 0434  AST '21 23 21 28  '$ ALT '14 15 16 17  '$ ALKPHOS 57 56 46 48  BILITOT 0.4 0.5 0.5 0.6  PROT 6.3* 6.2* 5.7* 6.3*  ALBUMIN 3.7 3.3* 3.0* 3.2*    No results for input(s):  "LIPASE", "AMYLASE" in the last 168 hours. No results for input(s): "AMMONIA" in the last 168 hours. Coagulation Profile: No results for input(s): "INR", "PROTIME" in the last 168 hours. Cardiac Enzymes: No results for input(s): "CKTOTAL", "CKMB", "CKMBINDEX", "TROPONINI" in the last 168 hours. BNP (last 3 results) No results for input(s): "PROBNP" in the last 8760 hours. HbA1C: No results for input(s): "HGBA1C" in the last 72 hours. CBG: Recent Labs  Lab 02/08/23 1638 02/08/23 2126 02/09/23 0740 02/09/23 1203  GLUCAP 232* 200* 131* 89   Lipid Profile: No results for input(s): "CHOL", "HDL", "LDLCALC", "TRIG", "CHOLHDL", "LDLDIRECT" in the last 72 hours. Thyroid Function Tests: No results for input(s): "TSH", "T4TOTAL", "FREET4", "T3FREE", "THYROIDAB" in the last 72 hours. Anemia Panel: No results for input(s): "VITAMINB12", "FOLATE", "FERRITIN", "TIBC", "IRON", "RETICCTPCT" in the last 72 hours. Sepsis Labs: Recent Labs  Lab 02/06/23 1333  LATICACIDVEN 1.7     Recent Results (from the past 240 hour(s))  Blood culture (routine x 2)     Status: None (Preliminary result)   Collection Time: 02/06/23  1:33 PM   Specimen: BLOOD RIGHT WRIST  Result Value Ref Range Status   Specimen Description   Final    BLOOD RIGHT WRIST Performed at [facility] [address], [place], [place] [address]    Special Requests   Final    BOTTLES DRAWN AEROBIC AND ANAEROBIC Blood Culture results may not be optimal due to an excessive volume of blood received in culture bottles Performed at [facility] [address] [place], [place] [address]    Culture   Final    NO GROWTH 3 DAYS Performed at [facility], [place] [address] [place], [place] [address]    Report Status PENDING  Incomplete  Resp panel by RT-PCR (RSV, Flu A&B, Covid) Anterior Nasal Swab     Status: None   Collection Time: 02/06/23  2:20 PM   Specimen: Anterior Nasal Swab  Result Value Ref  Range Status   SARS Coronavirus 2 by RT PCR NEGATIVE NEGATIVE Final    Comment: (NOTE) SARS-CoV-2 target nucleic acids are NOT DETECTED.  The SARS-CoV-2 RNA is generally detectable in upper respiratory specimens during the acute phase of infection. The lowest concentration of SARS-CoV-2 viral copies this assay can detect is 138 copies/mL. A negative result does not preclude SARS-Cov-2 infection and should not be used as the sole basis for treatment or other patient management decisions. A negative result may occur with  improper specimen collection/handling, submission of specimen other than nasopharyngeal swab, presence of viral mutation(s) within the areas targeted by this assay, and inadequate number of viral copies(<138 copies/mL). A negative result must be combined with clinical observations, patient history, and epidemiological information. The expected result is Negative.  Fact Sheet for Patients:  EntrepreneurPulse.com.au  Fact Sheet for  Healthcare Providers:  IncredibleEmployment.be  This test is no t yet approved or cleared by the [place] and  has been authorized for detection and/or diagnosis of SARS-CoV-2 by FDA under an Emergency Use Authorization (EUA). This EUA will remain  in effect (meaning this test can be used) for the duration of the COVID-19 declaration under Section 564(b)(1) of the Act, 21 U.S.C.section 360bbb-3(b)(1), unless the authorization is terminated  or revoked sooner.       Influenza A by PCR NEGATIVE NEGATIVE Final   Influenza B by PCR NEGATIVE NEGATIVE Final    Comment: (NOTE) The Xpert Xpress SARS-CoV-2/FLU/RSV plus assay is intended as an aid in the diagnosis of influenza from Nasopharyngeal swab specimens and should not be used as a sole basis for treatment. Nasal washings and aspirates are unacceptable for Xpert Xpress SARS-CoV-2/FLU/RSV testing.  Fact Sheet for  Patients: EntrepreneurPulse.com.au  Fact Sheet for Healthcare Providers: IncredibleEmployment.be  This test is not yet approved or cleared by the [place] FDA and has been authorized for detection and/or diagnosis of SARS-CoV-2 by FDA under an Emergency Use Authorization (EUA). This EUA will remain in effect (meaning this test can be used) for the duration of the COVID-19 declaration under Section 564(b)(1) of the Act, 21 U.S.C. section 360bbb-3(b)(1), unless the authorization is terminated or revoked.     Resp Syncytial Virus by PCR NEGATIVE NEGATIVE Final    Comment: (NOTE) Fact Sheet for Patients: EntrepreneurPulse.com.au  Fact Sheet for Healthcare Providers: IncredibleEmployment.be  This test is not yet approved or cleared by the [place] FDA and has been authorized for detection and/or diagnosis of SARS-CoV-2 by FDA under an Emergency Use Authorization (EUA). This EUA will remain in effect (meaning this test can be used) for the duration of the COVID-19 declaration under Section 564(b)(1) of the Act, 21 U.S.C. section 360bbb-3(b)(1), unless the authorization is terminated or revoked.  Performed at [facility], [place] [address] [place], [place] [address]   Blood culture (routine x 2)     Status: None (Preliminary result)   Collection Time: 02/06/23  2:20 PM   Specimen: BLOOD LEFT FOREARM  Result Value Ref Range Status   Specimen Description   Final    BLOOD LEFT FOREARM Performed at [facility], [place] [address], [place], [place] [address]    Special Requests   Final    BOTTLES DRAWN AEROBIC AND ANAEROBIC Blood Culture results may not be optimal due to an inadequate volume of blood received in culture bottles Performed at [facility] [address] [place], [place] [address]    Culture   Final    NO GROWTH 3 DAYS Performed at [facility], [place] [address], [place],  [address]    Report Status PENDING  Incomplete  Respiratory (~20 pathogens) panel by PCR     Status: Abnormal   Collection Time: 02/06/23  4:08 PM   Specimen: Nasopharyngeal Swab; Respiratory  Result Value Ref Range Status   Adenovirus NOT DETECTED NOT DETECTED Final   Coronavirus 229E NOT DETECTED NOT DETECTED Final    Comment: (NOTE) The Coronavirus on the Respiratory Panel, DOES NOT test for the novel  Coronavirus (2019 nCoV)    Coronavirus HKU1 NOT DETECTED NOT DETECTED Final   Coronavirus NL63 NOT DETECTED NOT DETECTED Final   Coronavirus OC43 NOT DETECTED NOT DETECTED Final   Metapneumovirus DETECTED (A) NOT DETECTED Final   Rhinovirus / Enterovirus NOT DETECTED NOT DETECTED Final  Influenza A NOT DETECTED NOT DETECTED Final   Influenza B NOT DETECTED NOT DETECTED Final   Parainfluenza Virus 1 NOT DETECTED NOT DETECTED Final   Parainfluenza Virus 2 NOT DETECTED NOT DETECTED Final   Parainfluenza Virus 3 NOT DETECTED NOT DETECTED Final   Parainfluenza Virus 4 NOT DETECTED NOT DETECTED Final   Respiratory Syncytial Virus NOT DETECTED NOT DETECTED Final   Bordetella pertussis NOT DETECTED NOT DETECTED Final   Bordetella Parapertussis NOT DETECTED NOT DETECTED Final   Chlamydophila pneumoniae NOT DETECTED NOT DETECTED Final   Mycoplasma pneumoniae NOT DETECTED NOT DETECTED Final    Comment: Performed at [facility], [place] [address], [place], [place] [address]  Expectorated Sputum Assessment w Gram Stain, Rflx to Resp Cult     Status: None   Collection Time: 02/07/23  1:34 AM   Specimen: Expectorated Sputum  Result Value Ref Range Status   Specimen Description EXPECTORATED SPUTUM  Final   Special Requests NONE  Final   Sputum evaluation   Final    THIS SPECIMEN IS ACCEPTABLE FOR SPUTUM CULTURE Performed at [facility], [place] [address], [place], [place] [address]    Report Status 02/07/2023 FINAL  Final   Culture, Respiratory w Gram Stain     Status: None (Preliminary result)   Collection Time: 02/07/23  1:34 AM  Result Value Ref Range Status   Specimen Description   Final    EXPECTORATED SPUTUM Performed at [facility] [address], [place], [place] [address]    Special Requests   Final    NONE Reflexed from [id number] Performed at [facility], [place] [address] [place], [place] [address]    Gram Stain   Final    FEW WBC PRESENT, PREDOMINANTLY MONONUCLEAR FEW GRAM POSITIVE COCCI IN PAIRS RARE GRAM POSITIVE RODS    Culture   Final    ABUNDANT STREPTOCOCCUS PNEUMONIAE SUSCEPTIBILITIES TO FOLLOW Performed at [facility], [place] [address] [place], [place] [address]    Report Status PENDING  Incomplete         Radiology Studies: DG CHEST PORT 1 VIEW  Result Date: 02/08/2023 CLINICAL DATA:  77 year old female with shortness of breath. EXAM: PORTABLE CHEST 1 VIEW COMPARISON:  Portable chest 02/06/2023 and earlier. FINDINGS: Portable AP semi upright view at 0702 hours. Lung volumes remain within normal limits. Small gastric hiatal hernia again noted. Other mediastinal contours are within normal limits. Allowing for portable technique the lungs are clear. No pneumothorax or pleural effusions. Visualized tracheal air column is within normal limits. Paucity of bowel gas in the visible abdomen. Stable visualized osseous structures. IMPRESSION: 1. No acute cardiopulmonary abnormality. 2. Small hiatal hernia. Electronically Signed   By: Genevie Ann M.D.   On: 02/08/2023 07:56        Scheduled Meds:  arformoterol  15 mcg Nebulization BID   aspirin EC  81 mg Oral Daily   azithromycin  500 mg Oral Daily   budesonide (PULMICORT) nebulizer solution  0.25 mg Nebulization BID   cyanocobalamin  1,000 mcg Oral Daily   enoxaparin (LOVENOX) injection  40 mg Subcutaneous [id number]   folic acid  [id number] mcg Oral Daily   gabapentin  100 mg Oral QHS   guaiFENesin   1,200 mg Oral BID   hydroxyurea  500 mg Oral TID AC   insulin aspart  0-5 Units Subcutaneous QHS   insulin aspart  0-9 Units Subcutaneous TID WC   ipratropium  0.5  mg Nebulization Q6H   levalbuterol  0.63 mg Nebulization Q6H   magnesium oxide  400 mg Oral Daily   methylPREDNISolone (SOLU-MEDROL) injection  60 mg Intravenous Q12H   montelukast  10 mg Oral QHS   pantoprazole  20 mg Oral BID   Continuous Infusions:  cefTRIAXone (ROCEPHIN)  IV 2 g (02/08/23 1602)      LOS: 3 days    Time spent: 35 minutes    Zarielle Cea A Kimla Furth, MD [facility]   If 7PM-7AM, please contact night-coverage [email or web address]  02/09/2023, 1:19 PM

## 2023-02-09 NOTE — Progress Notes (Signed)
Mobility Specialist - Progress Note  Pre-mobility: 91 bpm HR, 100% SPO2, 18 RR During mobility: 110 bpm HR, 99% SPO2, 24 RR Post-mobility: 95 bpm HR, 100% SPO2, 18 RR   02/09/23 1523  Oxygen Therapy  O2 Device Nasal Cannula  O2 Flow Rate (L/min) 2 L/min  Patient Activity (if Appropriate) Ambulating  Mobility  Activity Ambulated independently in hallway  Level of Assistance Standby assist, set-up cues, supervision of patient - no hands on  Assistive Device None  Distance Ambulated (ft) 350 ft  Range of Motion/Exercises Active  Activity Response Tolerated well  $Mobility charge 1 Mobility   Pt was found in bed on 2L [employer] and agreeable to ambulate. Had no complaints during session and stated feeling better today than previous days. Upon returning to room sat EOB and stated feeling a little SOB and tired. At EOS was left in bed with all necessities in reach and RN notified of session.  Ferd Hibbs Mobility Specialist

## 2023-02-09 NOTE — Care Management Important Message (Signed)
Important Message  Patient Details IM Letter given. Name: [name] MRN: [id number] Date of Birth: 01-22-1946   Medicare Important Message Given:  Yes     [name], [name] 02/09/2023, 11:08 AM

## 2023-02-10 LAB — CBC
HCT: 36.8 % (ref 36.0–46.0)
Hemoglobin: 11.8 g/dL — ABNORMAL LOW (ref 12.0–15.0)
MCH: 25.1 pg — ABNORMAL LOW (ref 26.0–34.0)
MCHC: 32.1 g/dL (ref 30.0–36.0)
MCV: 78.3 fL — ABNORMAL LOW (ref 80.0–100.0)
Platelets: 367 10*3/uL (ref 150–400)
RBC: 4.7 MIL/uL (ref 3.87–5.11)
RDW: 17.3 % — ABNORMAL HIGH (ref 11.5–15.5)
WBC: 15.3 10*3/uL — ABNORMAL HIGH (ref 4.0–10.5)
nRBC: 0 % (ref 0.0–0.2)

## 2023-02-10 LAB — BASIC METABOLIC PANEL
Anion gap: 9 (ref 5–15)
BUN: 24 mg/dL — ABNORMAL HIGH (ref 8–23)
CO2: 24 mmol/L (ref 22–32)
Calcium: 9 mg/dL (ref 8.9–10.3)
Chloride: 104 mmol/L (ref 98–111)
Creatinine, Ser: 0.94 mg/dL (ref 0.44–1.00)
GFR, Estimated: >60 mL/min (ref >60)
Glucose, Bld: 104 mg/dL — ABNORMAL HIGH (ref 70–99)
Potassium: 4.3 mmol/L (ref 3.5–5.1)
Sodium: 137 mmol/L (ref 135–145)

## 2023-02-10 LAB — GLUCOSE, CAPILLARY
Glucose-Capillary: 140 mg/dL — ABNORMAL HIGH (ref 70–99)
Glucose-Capillary: 193 mg/dL — ABNORMAL HIGH (ref 70–99)
Glucose-Capillary: 127 mg/dL — ABNORMAL HIGH (ref 70–99)

## 2023-02-10 LAB — CULTURE, RESPIRATORY W GRAM STAIN

## 2023-02-10 MED ORDER — PREDNISONE 20 MG PO TABS
40.0000 mg | ORAL_TABLET | Freq: Every day | ORAL | Status: DC
  Administered 2023-02-11: 40 mg via ORAL
  Filled 2023-02-10: qty 2

## 2023-02-10 NOTE — Progress Notes (Signed)
PROGRESS NOTE    [name]  [id number] DOB: 08-Aug-1946 DOA: 02/06/2023 PCP: Merrilee Seashore, MD   Brief Narrative: 77 year old with past medical history significant for asthma with COPD overlap, vocal cord dysfunction, follow-up with Dr. Lamonte Sakai, rheumatoid arthritis on methotrexate, prior CVA, diabetes type 2, some concern for ILD presents with with worsening cough, shortness of breath.   Assessment & Plan:   Principal Problem:   COPD (chronic obstructive pulmonary disease) (HCC) Active Problems:   Diabetes ([place])   Vocal cord dysfunction   Acute respiratory distress   Asthma-COPD overlap syndrome  1-Acute COPD, Asthma exacerbation in the setting of metapneumovirus -Patient developed worsening respiratory distress and shortness of breath overnight, bilateral expiratory wheezing 3/06. -Improved with IV solumedrol, transition to oral prednisone and monitor.  -DuoNeb every 6 hours.  -Continue with  Brovana and Pulmicort, hold Breo-Ellipta.  -Repeated chest x ray : no acute cardiopulmonary abnormalities.  -Continue with Azithromycin,  IV ceftriaxone/, sputum growing streptococcus Pneumonia.  -Continue with  Mucinex to 1200 ,g BID.  -Flutter valve.  Improving. Hopefully discharge tomorrow.   Fever; Resolved Chest x ray negative.  In setting metapneumovirus.  Sputum culture abundant streptococcus pneumonia. Continue with IV ceftriaxone.   Sickle cell trait On Hydrea ? Will defer to PCP.   DM Type 2; Diet controlled.  SSI while on steroids.   RA; on methotrexate weekly.  Hypokalemia; Replaced. K at 5.2 Repeat Bmet today       Estimated body mass index is 31.16 kg/m as calculated from the following:   Height as of this encounter: '5\' 1"'$  (1.549 m).   Weight as of this encounter: 74.8 kg.   DVT prophylaxis: Lovenox Code Status: Full code Family Communication: Care discussed with patient. Granddaughter at bedside 3/06.  Disposition Plan:  Status is:  Inpatient Remains inpatient appropriate because: Respiratory distress.     Consultants:  none  Procedures:    Antimicrobials:    Subjective: She is feeling better, cough improving. , she is getting to her baseline.   Objective: Vitals:   02/09/23 2154 02/10/23 0418 02/10/23 0826 02/10/23 1257  BP: (!) 135/91 129/75  137/81  Pulse: 83   92  Resp:    18  Temp: 98.3 F (36.8 C) 97.9 F (36.6 C)  97.8 F (36.6 C)  TempSrc: Oral Oral  Oral  SpO2: 100%  99% 100%  Weight:  74.8 kg    Height:        Intake/Output Summary (Last 24 hours) at 02/10/2023 1413 Last data filed at 02/10/2023 1000 Gross per 24 hour  Intake 460 ml  Output 1550 ml  Net -1090 ml    Filed Weights   02/06/23 2209 02/07/23 0558 02/10/23 0418  Weight: 74.5 kg 74.5 kg 74.8 kg    Examination:  General exam: NAD Respiratory system: BL sporadic ronchus.  Cardiovascular system: S 1, S 2  RRR Gastrointestinal system: BS present, soft, nt Central nervous system: alert  Extremities: No edema    Data Reviewed: I have personally reviewed following labs and imaging studies  CBC: Recent Labs  Lab 02/06/23 1333 02/07/23 0427 02/08/23 0436 02/09/23 0434 02/10/23 0502  WBC 8.8 8.1 8.7 12.6* 15.3*  HGB 12.8 12.3 11.7* 12.3 11.8*  HCT 40.0 38.9 37.1 39.0 36.8  MCV 78.9* 80.7 79.4* 80.6 78.3*  PLT 564* 441* 430* 447* [id number]    Basic Metabolic Panel: Recent Labs  Lab 02/06/23 1333 02/07/23 0427 02/08/23 0436 02/09/23 0434  NA 136 139 139 137  K 3.3* 4.2 3.1* 5.2*  CL 105 109 110 106  CO2 22 21* 24 24  GLUCOSE 87 141* 72 146*  BUN '17 14 15 16  '$ CREATININE 1.05* 0.88 0.89 0.91  CALCIUM 8.6* 8.5* 8.1* 8.4*    GFR: Estimated Creatinine Clearance: 47.9 mL/min (by C-G formula based on SCr of 0.91 mg/dL). Liver Function Tests: Recent Labs  Lab 02/06/23 1333 02/07/23 0427 02/08/23 0436 02/09/23 0434  AST '21 23 21 28  '$ ALT '14 15 16 17  '$ ALKPHOS 57 56 46 48  BILITOT 0.4 0.5 0.5 0.6  PROT  6.3* 6.2* 5.7* 6.3*  ALBUMIN 3.7 3.3* 3.0* 3.2*    No results for input(s): "LIPASE", "AMYLASE" in the last 168 hours. No results for input(s): "AMMONIA" in the last 168 hours. Coagulation Profile: No results for input(s): "INR", "PROTIME" in the last 168 hours. Cardiac Enzymes: No results for input(s): "CKTOTAL", "CKMB", "CKMBINDEX", "TROPONINI" in the last 168 hours. BNP (last 3 results) No results for input(s): "PROBNP" in the last 8760 hours. HbA1C: No results for input(s): "HGBA1C" in the last 72 hours. CBG: Recent Labs  Lab 02/09/23 0740 02/09/23 1203 02/09/23 1729 02/09/23 2146 02/10/23 0732  GLUCAP 131* 89 156* 169* 140*    Lipid Profile: No results for input(s): "CHOL", "HDL", "LDLCALC", "TRIG", "CHOLHDL", "LDLDIRECT" in the last 72 hours. Thyroid Function Tests: No results for input(s): "TSH", "T4TOTAL", "FREET4", "T3FREE", "THYROIDAB" in the last 72 hours. Anemia Panel: No results for input(s): "VITAMINB12", "FOLATE", "FERRITIN", "TIBC", "IRON", "RETICCTPCT" in the last 72 hours. Sepsis Labs: Recent Labs  Lab 02/06/23 1333  LATICACIDVEN 1.7     Recent Results (from the past 240 hour(s))  Blood culture (routine x 2)     Status: None (Preliminary result)   Collection Time: 02/06/23  1:33 PM   Specimen: BLOOD RIGHT WRIST  Result Value Ref Range Status   Specimen Description   Final    BLOOD RIGHT WRIST Performed at [facility] [address] [place], [place] [address]    Special Requests   Final    BOTTLES DRAWN AEROBIC AND ANAEROBIC Blood Culture results may not be optimal due to an excessive volume of blood received in culture bottles Performed at [facility] [address], [place], [place] [address]    Culture   Final    NO GROWTH 4 DAYS Performed at [facility], [place] [address], [place], [place] [address]    Report Status PENDING  Incomplete  Resp panel by RT-PCR (RSV, Flu A&B, Covid) Anterior Nasal Swab      Status: None   Collection Time: 02/06/23  2:20 PM   Specimen: Anterior Nasal Swab  Result Value Ref Range Status   SARS Coronavirus 2 by RT PCR NEGATIVE NEGATIVE Final    Comment: (NOTE) SARS-CoV-2 target nucleic acids are NOT DETECTED.  The SARS-CoV-2 RNA is generally detectable in upper respiratory specimens during the acute phase of infection. The lowest concentration of SARS-CoV-2 viral copies this assay can detect is 138 copies/mL. A negative result does not preclude SARS-Cov-2 infection and should not be used as the sole basis for treatment or other patient management decisions. A negative result may occur with  improper specimen collection/handling, submission of specimen other than nasopharyngeal swab, presence of viral mutation(s) within the areas targeted by this assay, and inadequate number of viral copies(<138 copies/mL). A negative result must be combined with clinical observations, patient history, and epidemiological information. The expected result is Negative.  Fact Sheet for Patients:  EntrepreneurPulse.com.au  Fact Sheet for Healthcare Providers:  IncredibleEmployment.be  This test is no t yet approved or cleared by the [place] FDA and  has been authorized for detection and/or diagnosis of SARS-CoV-2 by FDA under an Emergency Use Authorization (EUA). This EUA will remain  in effect (meaning this test can be used) for the duration of the COVID-19 declaration under Section 564(b)(1) of the Act, 21 U.S.C.section 360bbb-3(b)(1), unless the authorization is terminated  or revoked sooner.       Influenza A by PCR NEGATIVE NEGATIVE Final   Influenza B by PCR NEGATIVE NEGATIVE Final    Comment: (NOTE) The Xpert Xpress SARS-CoV-2/FLU/RSV plus assay is intended as an aid in the diagnosis of influenza from Nasopharyngeal swab specimens and should not be used as a sole basis for treatment. Nasal washings and aspirates are  unacceptable for Xpert Xpress SARS-CoV-2/FLU/RSV testing.  Fact Sheet for Patients: EntrepreneurPulse.com.au  Fact Sheet for Healthcare Providers: IncredibleEmployment.be  This test is not yet approved or cleared by the [place] FDA and has been authorized for detection and/or diagnosis of SARS-CoV-2 by FDA under an Emergency Use Authorization (EUA). This EUA will remain in effect (meaning this test can be used) for the duration of the COVID-19 declaration under Section 564(b)(1) of the Act, 21 U.S.C. section 360bbb-3(b)(1), unless the authorization is terminated or revoked.     Resp Syncytial Virus by PCR NEGATIVE NEGATIVE Final    Comment: (NOTE) Fact Sheet for Patients: EntrepreneurPulse.com.au  Fact Sheet for Healthcare Providers: IncredibleEmployment.be  This test is not yet approved or cleared by the [place] FDA and has been authorized for detection and/or diagnosis of SARS-CoV-2 by FDA under an Emergency Use Authorization (EUA). This EUA will remain in effect (meaning this test can be used) for the duration of the COVID-19 declaration under Section 564(b)(1) of the Act, 21 U.S.C. section 360bbb-3(b)(1), unless the authorization is terminated or revoked.  Performed at [facility], [place] [address] [place], [place] [address]   Blood culture (routine x 2)     Status: None (Preliminary result)   Collection Time: 02/06/23  2:20 PM   Specimen: BLOOD LEFT FOREARM  Result Value Ref Range Status   Specimen Description   Final    BLOOD LEFT FOREARM Performed at [facility], [place] [address] [place], [place] [address]    Special Requests   Final    BOTTLES DRAWN AEROBIC AND ANAEROBIC Blood Culture results may not be optimal due to an inadequate volume of blood received in culture bottles Performed at [facility] [address], [place], [place]  [address]    Culture   Final    NO GROWTH 4 DAYS Performed at [facility], [place] [address] [place], [place] [address]    Report Status PENDING  Incomplete  Respiratory (~20 pathogens) panel by PCR     Status: Abnormal   Collection Time: 02/06/23  4:08 PM   Specimen: Nasopharyngeal Swab; Respiratory  Result Value Ref Range Status   Adenovirus NOT DETECTED NOT DETECTED Final   Coronavirus 229E NOT DETECTED NOT DETECTED Final    Comment: (NOTE) The Coronavirus on the Respiratory Panel, DOES NOT test for the novel  Coronavirus (2019 nCoV)    Coronavirus HKU1 NOT DETECTED NOT DETECTED Final   Coronavirus NL63 NOT DETECTED NOT DETECTED Final   Coronavirus OC43 NOT DETECTED NOT DETECTED Final   Metapneumovirus DETECTED (A) NOT DETECTED Final  Rhinovirus / Enterovirus NOT DETECTED NOT DETECTED Final   Influenza A NOT DETECTED NOT DETECTED Final   Influenza B NOT DETECTED NOT DETECTED Final   Parainfluenza Virus 1 NOT DETECTED NOT DETECTED Final   Parainfluenza Virus 2 NOT DETECTED NOT DETECTED Final   Parainfluenza Virus 3 NOT DETECTED NOT DETECTED Final   Parainfluenza Virus 4 NOT DETECTED NOT DETECTED Final   Respiratory Syncytial Virus NOT DETECTED NOT DETECTED Final   Bordetella pertussis NOT DETECTED NOT DETECTED Final   Bordetella Parapertussis NOT DETECTED NOT DETECTED Final   Chlamydophila pneumoniae NOT DETECTED NOT DETECTED Final   Mycoplasma pneumoniae NOT DETECTED NOT DETECTED Final    Comment: Performed at [facility], [place] [address] [place], [place] [address]  Expectorated Sputum Assessment w Gram Stain, Rflx to Resp Cult     Status: None   Collection Time: 02/07/23  1:34 AM   Specimen: Expectorated Sputum  Result Value Ref Range Status   Specimen Description EXPECTORATED SPUTUM  Final   Special Requests NONE  Final   Sputum evaluation   Final    THIS SPECIMEN IS ACCEPTABLE FOR SPUTUM CULTURE Performed at [facility], [place] [address] [place], [place] [address]    Report Status 02/07/2023 FINAL  Final  Culture, Respiratory w Gram Stain     Status: None   Collection Time: 02/07/23  1:34 AM  Result Value Ref Range Status   Specimen Description   Final    EXPECTORATED SPUTUM Performed at [facility], [place] [address] [place], [place] [address]    Special Requests   Final    NONE Reflexed from [phone] Performed at [facility], [place] [address], [place], [place] [address]    Gram Stain   Final    FEW WBC PRESENT, PREDOMINANTLY MONONUCLEAR FEW GRAM POSITIVE COCCI IN PAIRS RARE GRAM POSITIVE RODS Performed at [facility], [place] [address], [place], [place] [address]    Culture ABUNDANT STREPTOCOCCUS PNEUMONIAE  Final   Report Status 02/10/2023 FINAL  Final   Organism ID, Bacteria STREPTOCOCCUS PNEUMONIAE  Final      Susceptibility   Streptococcus pneumoniae - MIC*    ERYTHROMYCIN >=8 RESISTANT Resistant     LEVOFLOXACIN 1 SENSITIVE Sensitive     VANCOMYCIN 0.5 SENSITIVE Sensitive     PENICILLIN (meningitis) 0.25 RESISTANT Resistant     PENO - penicillin 0.25      PENICILLIN (non-meningitis) 0.25 SENSITIVE Sensitive     PENICILLIN (oral) 0.25 INTERMEDIATE Intermediate     CEFTRIAXONE (non-meningitis) <=0.12 SENSITIVE Sensitive     CEFTRIAXONE (meningitis) <=0.12 SENSITIVE Sensitive     * ABUNDANT STREPTOCOCCUS PNEUMONIAE         Radiology Studies: No results found.      Scheduled Meds:  arformoterol  15 mcg Nebulization BID   aspirin EC  81 mg Oral Daily   budesonide (PULMICORT) nebulizer solution  0.25 mg Nebulization BID   cyanocobalamin  1,000 mcg Oral Daily   enoxaparin (LOVENOX) injection  40 mg Subcutaneous [id number]   folic acid  [id number] mcg Oral Daily   gabapentin  100 mg Oral QHS   guaiFENesin  1,200 mg Oral BID   hydroxyurea  500 mg Oral TID AC   insulin aspart  0-5 Units Subcutaneous QHS   insulin aspart  0-9 Units Subcutaneous TID WC   ipratropium  0.5  mg Nebulization Q6H   levalbuterol  0.63 mg Nebulization Q6H   magnesium oxide  400 mg Oral Daily   montelukast  10 mg Oral QHS   pantoprazole  20 mg Oral BID   [START ON 02/11/2023] predniSONE  40 mg Oral Q breakfast   Continuous Infusions:  cefTRIAXone (ROCEPHIN)  IV 2 g (02/09/23 1800)      LOS: 4 days    Time spent: 35 minutes    Rilya Longo A Macil Crady, MD [facility]   If 7PM-7AM, please contact night-coverage [email or web address]  02/10/2023, 2:13 PM

## 2023-02-10 NOTE — Evaluation (Signed)
Physical Therapy Evaluation Patient Details Name: [name] MRN: [id number] DOB: Apr 01, 1946 Today's Date: 02/10/2023  History of Present Illness  Pt is 77 yo female admitted 02/06/23 with COPD exacerbation.  Pt with hx including RA, COPD, asthma, CVA, DM2, and some concern for ILD  Clinical Impression  Pt admitted with above diagnosis.  She is on RA at home and is independent with adls, iadls, and community ambulation.  Today, pt has been mobilizing in her room independently.  She demonstrated safe transfers and was able to ambulate 400' with steady gait, no LOB, and no shortness of breath even while carrying on conversation with walking.  She was trialed  on RA and maintained O2 sats of 97%. No further acute PT needs       Recommendations for follow up therapy are one component of a multi-disciplinary discharge planning process, led by the attending physician.  Recommendations may be updated based on patient status, additional functional criteria and insurance authorization.  Follow Up Recommendations No PT follow up      Assistance Recommended at Discharge PRN  Patient can return home with the following       Equipment Recommendations None recommended by PT  Recommendations for Other Services       Functional Status Assessment Patient has not had a recent decline in their functional status     Precautions / Restrictions Precautions Precautions: None      Mobility  Bed Mobility Overal bed mobility: Modified Independent Bed Mobility: Supine to Sit, Sit to Supine     Supine to sit: Modified independent (Device/Increase time), HOB elevated Sit to supine: Modified independent (Device/Increase time), HOB elevated        Transfers Overall transfer level: Modified independent Equipment used: None Transfers: Sit to/from Stand Sit to Stand: Modified independent (Device/Increase time)           General transfer comment: Supervision during session but demonstrated  safely and has been mobilizing in room on her own    Ambulation/Gait Ambulation/Gait assistance: Supervision Gait Distance (Feet): 400 Feet Assistive device: None Gait Pattern/deviations: Step-through pattern, Decreased stride length       General Gait Details: Slower gait pattern but stable and reports feels near baseline.  Has been ambulating in room on her own but had supervision for hallway ambulation.  She was on RA with stable O2 and able to carry on conversation with activity.  Verbalizing how to gradually increase activity at home  Stairs            Wheelchair Mobility    Modified Rankin (Stroke Patients Only)       Balance Overall balance assessment: Needs assistance Sitting-balance support: No upper extremity supported Sitting balance-Leahy Scale: Good     Standing balance support: No upper extremity supported Standing balance-Leahy Scale: Good                               Pertinent Vitals/Pain Pain Assessment Pain Assessment: No/denies pain    Home Living Family/patient expects to be discharged to:: Private residence Living Arrangements: Children Available Help at Discharge: Family;Available PRN/intermittently Type of Home: House Home Access: Stairs to enter   [employer] of Steps: 4   Home Layout: One level Home Equipment: Conservation officer, nature (2 wheels) Additional Comments: Not on home O2    Prior Function Prior Level of Function : Independent/Modified Independent;Driving  Mobility Comments: Could ambulate in community without AD; no falls ADLs Comments: independent adls and iadls     Hand Dominance        Extremity/Trunk Assessment   Upper Extremity Assessment Upper Extremity Assessment: Overall WFL for tasks assessed    Lower Extremity Assessment Lower Extremity Assessment: Overall WFL for tasks assessed    Cervical / Trunk Assessment Cervical / Trunk Assessment: Normal  Communication    Communication: No difficulties  Cognition Arousal/Alertness: Awake/alert Behavior During Therapy: WFL for tasks assessed/performed Overall Cognitive Status: Within Functional Limits for tasks assessed                                          General Comments General comments (skin integrity, edema, etc.): Pt on 2 L O2 at arrival with sats 100%.  Tried RA and sats 97% rest and 97% activity. Left on RA and notified RN    Exercises     Assessment/Plan    PT Assessment Patient does not need any further PT services  PT Problem List         PT Treatment Interventions      PT Goals (Current goals can be found in the Care Plan section)  Acute Rehab PT Goals Patient Stated Goal: return home PT Goal Formulation: All assessment and education complete, DC therapy    Frequency       Co-evaluation               AM-PAC PT "6 Clicks" Mobility  Outcome Measure Help needed turning from your back to your side while in a flat bed without using bedrails?: None Help needed moving from lying on your back to sitting on the side of a flat bed without using bedrails?: None Help needed moving to and from a bed to a chair (including a wheelchair)?: None Help needed standing up from a chair using your arms (e.g., wheelchair or bedside chair)?: None Help needed to walk in hospital room?: None Help needed climbing 3-5 steps with a railing? : A Little 6 Click Score: 23    End of Session   Activity Tolerance: Patient tolerated treatment well Patient left: in bed;with call bell/phone within reach Nurse Communication: Mobility status PT Visit Diagnosis: Other abnormalities of gait and mobility (R26.89)    Time: [id number] PT Time Calculation (min) (ACUTE ONLY): 16 min   Charges:   PT Evaluation $PT Eval Low Complexity: 1 Low          Leroy Pettway, PT Acute Rehab [facility] [phone]   Karlton Lemon 02/10/2023, 1:04 PM

## 2023-02-10 NOTE — Progress Notes (Signed)
.   Transition of Care [facility]) Screening Note   Patient Details  Name: [name] Date of Birth: 1946-01-06   Transition of [facility]) CM/SW Contact:    Illene Regulus, LCSW Phone Number: 02/10/2023, 12:13 PM    Transition of Care Department [facility]) has reviewed patient and no TOC needs have been identified at this time. We will continue to monitor patient advancement through interdisciplinary progression rounds. If new patient transition needs arise, please place a TOC consult.

## 2023-02-11 LAB — GLUCOSE, CAPILLARY
Glucose-Capillary: 81 mg/dL (ref 70–99)
Glucose-Capillary: 119 mg/dL — ABNORMAL HIGH (ref 70–99)

## 2023-02-11 LAB — CULTURE, BLOOD (ROUTINE X 2)
Culture: NO GROWTH
Culture: NO GROWTH

## 2023-02-11 MED ORDER — CEFDINIR 300 MG PO CAPS: 300.0000 mg | ORAL_CAPSULE | Freq: Two times a day (BID) | ORAL | 0 refills | Status: AC

## 2023-02-11 MED ORDER — LEVALBUTEROL HCL 0.63 MG/3ML IN NEBU: 0.6300 mg | INHALATION_SOLUTION | Freq: Four times a day (QID) | RESPIRATORY_TRACT | 12 refills | Status: AC

## 2023-02-11 MED ORDER — GUAIFENESIN ER 600 MG PO TB12: 1200.0000 mg | ORAL_TABLET | Freq: Two times a day (BID) | ORAL | 1 refills | Status: DC

## 2023-02-11 MED ORDER — BUDESONIDE-FORMOTEROL FUMARATE 160-4.5 MCG/ACT IN AERO: 2.0000 | INHALATION_SPRAY | Freq: Two times a day (BID) | RESPIRATORY_TRACT | 3 refills | Status: DC

## 2023-02-11 MED ORDER — PREDNISONE 20 MG PO TABS: 40.0000 mg | ORAL_TABLET | Freq: Every day | ORAL | 0 refills | Status: DC

## 2023-02-11 MED ORDER — ALBUTEROL SULFATE HFA 108 (90 BASE) MCG/ACT IN AERS: 1.0000 | INHALATION_SPRAY | Freq: Four times a day (QID) | RESPIRATORY_TRACT | 3 refills | Status: DC | PRN

## 2023-02-11 MED ORDER — IPRATROPIUM BROMIDE 0.02 % IN SOLN: 0.5000 mg | Freq: Four times a day (QID) | RESPIRATORY_TRACT | 12 refills | Status: AC

## 2023-02-11 NOTE — Plan of Care (Signed)
  Problem: Education: Goal: Knowledge of disease or condition will improve Outcome: Completed/Met Goal: Knowledge of the prescribed therapeutic regimen will improve Outcome: Completed/Met Goal: Individualized Educational Video(s) Outcome: Completed/Met   Problem: Activity: Goal: Ability to tolerate increased activity will improve Outcome: Completed/Met Goal: Will verbalize the importance of balancing activity with adequate rest periods Outcome: Completed/Met   Problem: Respiratory: Goal: Ability to maintain a clear airway will improve Outcome: Completed/Met Goal: Levels of oxygenation will improve Outcome: Completed/Met Goal: Ability to maintain adequate ventilation will improve Outcome: Completed/Met   Problem: Education: Goal: Knowledge of General Education information will improve Description: Including pain rating scale, medication(s)/side effects and non-pharmacologic comfort measures Outcome: Completed/Met   Problem: Health Behavior/Discharge Planning: Goal: Ability to manage health-related needs will improve Outcome: Completed/Met   Problem: Clinical Measurements: Goal: Ability to maintain clinical measurements within normal limits will improve Outcome: Completed/Met Goal: Will remain free from infection Outcome: Completed/Met Goal: Diagnostic test results will improve Outcome: Completed/Met Goal: Respiratory complications will improve Outcome: Completed/Met Goal: Cardiovascular complication will be avoided Outcome: Completed/Met   Problem: Activity: Goal: Risk for activity intolerance will decrease Outcome: Completed/Met   Problem: Nutrition: Goal: Adequate nutrition will be maintained Outcome: Completed/Met   Problem: Coping: Goal: Level of anxiety will decrease Outcome: Completed/Met   Problem: Elimination: Goal: Will not experience complications related to bowel motility Outcome: Completed/Met Goal: Will not experience complications related to  urinary retention Outcome: Completed/Met   Problem: Pain Managment: Goal: General experience of comfort will improve Outcome: Completed/Met   Problem: Safety: Goal: Ability to remain free from injury will improve Outcome: Completed/Met   Problem: Skin Integrity: Goal: Risk for impaired skin integrity will decrease Outcome: Completed/Met   Problem: Education: Goal: Ability to describe self-care measures that may prevent or decrease complications (Diabetes Survival Skills Education) will improve Outcome: Completed/Met Goal: Individualized Educational Video(s) Outcome: Completed/Met   Problem: Coping: Goal: Ability to adjust to condition or change in health will improve Outcome: Completed/Met   Problem: Fluid Volume: Goal: Ability to maintain a balanced intake and output will improve Outcome: Completed/Met   Problem: Health Behavior/Discharge Planning: Goal: Ability to identify and utilize available resources and services will improve Outcome: Completed/Met Goal: Ability to manage health-related needs will improve Outcome: Completed/Met   Problem: Metabolic: Goal: Ability to maintain appropriate glucose levels will improve Outcome: Completed/Met   Problem: Nutritional: Goal: Maintenance of adequate nutrition will improve Outcome: Completed/Met Goal: Progress toward achieving an optimal weight will improve Outcome: Completed/Met   Problem: Skin Integrity: Goal: Risk for impaired skin integrity will decrease Outcome: Completed/Met   Problem: Tissue Perfusion: Goal: Adequacy of tissue perfusion will improve Outcome: Completed/Met

## 2023-02-11 NOTE — Progress Notes (Signed)
Patient was given discharge orders to go home. Patient was given discharge paperwork/instructions and was reviewed by the RN. Patient did not have any questions or concerns regarding discharge instructions. Patient collected and left with all belongings, was stable, and was wheeled down by the Nurse Tech to be picked up to go home.

## 2023-02-11 NOTE — Discharge Summary (Addendum)
Physician Discharge Summary   Patient: [name] MRN: [id number] DOB: [birth year]  Admit date:     02/06/2023  Discharge date: 02/11/23  Discharge Physician: Elmarie Shiley   PCP: Merrilee Seashore, MD   Recommendations at discharge:    Follow up for resolution COPD flare.    Discharge Diagnoses: Principal Problem:   COPD (chronic obstructive pulmonary disease) ([place]) Active Problems:   Diabetes ([place])   Vocal cord dysfunction   Acute respiratory distress   Asthma-COPD overlap syndrome  Resolved Problems:   * No resolved hospital problems. *  Hospital Course: 77 year old with past medical history significant for asthma with COPD overlap, vocal cord dysfunction, follow-up with Dr. Lamonte Sakai, rheumatoid arthritis on methotrexate, prior CVA, diabetes type 2, some concern for ILD presents with with worsening cough, shortness of breath.     Assessment and Plan: 1-Acute COPD, Asthma exacerbation in the setting of meta-pneumovirus, streptococcus Pneumonia.  -Patient developed worsening respiratory distress and shortness of breath overnight, bilateral expiratory wheezing 3/06. -Improved with IV solumedrol, transition to oral prednisone, discharge on prednisone for 5 days.  -DuoNeb every 6 hours. Continue at discharge -Treated  with  Brovana and Pulmicort, resume Symbicort at discharge.  -Repeated chest x ray : no acute cardiopulmonary abnormalities.  -Continue with Azithromycin,  IV ceftriaxone/, sputum growing streptococcus Pneumonia.  -Continue with  Mucinex to 1200 ,g BID.  -Flutter valve.  Feeling at baseline, discharge on Cefdinir.    Fever; Resolved Chest x ray negative.  In setting metapneumovirus.  Sputum culture abundant streptococcus pneumonia. Treated  with IV ceftriaxone.  Discharge on cefdinir.   Sickle cell trait On Hydrea ? Will defer to PCP.    DM Type 2; Diet controlled.  SSI while on steroids.    RA; on methotrexate weekly.  Hypokalemia; Replaced. K  at 5.2 Repeat Bmet today           Consultants: None Procedures performed: none Disposition: Home Diet recommendation:  Cardiac diet DISCHARGE MEDICATION: Allergies as of 02/11/2023       Reactions   Statins Other (See Comments)   Leg cramps   Penicillins Itching, Rash   Did it involve swelling of the face/tongue/throat, SOB, or low BP? N Did it involve sudden or severe rash/hives, skin peeling, or any reaction on the inside of your mouth or nose? Y Did you need to seek medical attention at a hospital or doctor's office? N When did it last happen? Decades Ago      If all above answers are "NO", may proceed with cephalosporin use.   Pravastatin Other (See Comments)   leg cramps        Medication List     TAKE these medications    acetaminophen 500 MG tablet Commonly known as: TYLENOL Take 500-1,000 mg by mouth every 6 (six) hours as needed (pain.).   albuterol 108 (90 Base) MCG/ACT inhaler Commonly known as: Proventil HFA Inhale 1-2 puffs into the lungs every 6 (six) hours as needed for wheezing or shortness of breath.   AMBULATORY NON FORMULARY MEDICATION Medication Name: oral iron once daily   aspirin EC 81 MG tablet Take 81 mg by mouth in the morning.   budesonide-formoterol 160-4.5 MCG/ACT inhaler Commonly known as: Symbicort Inhale 2 puffs into the lungs in the morning and at bedtime.   CALCIUM-CHOLECALCIFEROL PO Take 1 tablet by mouth in the morning.   cefdinir 300 MG capsule Commonly known as: OMNICEF Take 1 capsule (300 mg total) by mouth 2 (two) times  daily for 5 days.   CENTRUM SILVER 50+WOMEN PO Take 1 tablet by mouth in the morning.   cetirizine 10 MG tablet Commonly known as: ZyrTEC Allergy Take 1 tablet (10 mg total) by mouth daily.   cholecalciferol 25 MCG (1000 UNIT) tablet Commonly known as: VITAMIN D3 Take 1,000 Units by mouth daily.   cyanocobalamin 1000 MCG tablet Commonly known as: VITAMIN B12 Take 1,000 mcg by mouth in the  morning and at bedtime.   fluticasone 50 MCG/ACT nasal spray Commonly known as: FLONASE Place 1 spray into the nose 2 (two) times daily.   folic acid A999333 MCG tablet Commonly known as: FOLVITE Take 400 mcg by mouth in the morning.   gabapentin 100 MG capsule Commonly known as: NEURONTIN Take 100 mg by mouth at bedtime.   guaiFENesin 600 MG 12 hr tablet Commonly known as: MUCINEX Take 2 tablets (1,200 mg total) by mouth 2 (two) times daily.   hydroxyurea 500 MG capsule Commonly known as: HYDREA Take 1 capsule (500 mg total) by mouth 3 (three) times daily before meals.   ipratropium 0.02 % nebulizer solution Commonly known as: ATROVENT Take 2.5 mLs (0.5 mg total) by nebulization every 6 (six) hours.   ipratropium 17 MCG/ACT inhaler Commonly known as: ATROVENT HFA Inhale 2 puffs into the lungs every 6 (six) hours as needed for wheezing.   levalbuterol 0.63 MG/3ML nebulizer solution Commonly known as: XOPENEX Take 3 mLs (0.63 mg total) by nebulization every 6 (six) hours.   magnesium oxide 400 MG tablet Commonly known as: MAG-OX Take 400 mg by mouth in the morning.   methotrexate 2.5 MG tablet Commonly known as: RHEUMATREX Take 15 mg by mouth once a week. Take on Monday   montelukast 10 MG tablet Commonly known as: SINGULAIR Take 1 tablet (10 mg total) by mouth at bedtime.   pantoprazole 20 MG tablet Commonly known as: PROTONIX Take 20 mg by mouth 2 (two) times daily.   predniSONE 20 MG tablet Commonly known as: DELTASONE Take 2 tablets (40 mg total) by mouth daily with breakfast for 5 days. Start taking on: February 12, 2023   rosuvastatin 10 MG tablet Commonly known as: CRESTOR Take 10 mg by mouth at bedtime.        Discharge Exam: Filed Weights   02/06/23 2209 02/07/23 0558 02/10/23 0418  Weight: 74.5 kg 74.5 kg 74.8 kg   General; NAD  Condition at discharge: stable  The results of significant diagnostics from this hospitalization (including imaging,  microbiology, ancillary and laboratory) are listed below for reference.   Imaging Studies: DG CHEST PORT 1 VIEW  Result Date: 02/08/2023 CLINICAL DATA:  77 year old female with shortness of breath. EXAM: PORTABLE CHEST 1 VIEW COMPARISON:  Portable chest 02/06/2023 and earlier. FINDINGS: Portable AP semi upright view at 0702 hours. Lung volumes remain within normal limits. Small gastric hiatal hernia again noted. Other mediastinal contours are within normal limits. Allowing for portable technique the lungs are clear. No pneumothorax or pleural effusions. Visualized tracheal air column is within normal limits. Paucity of bowel gas in the visible abdomen. Stable visualized osseous structures. IMPRESSION: 1. No acute cardiopulmonary abnormality. 2. Small hiatal hernia. Electronically Signed   By: Genevie Ann M.D.   On: 02/08/2023 07:56   DG Chest Port 1 View  Result Date: 02/06/2023 CLINICAL DATA:  Shortness of breath EXAM: PORTABLE CHEST 1 VIEW COMPARISON:  Chest radiograph dated 09/23/2022 FINDINGS: Normal lung volumes. Rounded retrocardiac density in keeping with known hiatal hernia. No  pleural effusion or pneumothorax. The heart size and mediastinal contours are within normal limits. Degenerative changes of the bilateral glenohumeral joints. IMPRESSION: 1. No active disease. 2. Small hiatal hernia. Electronically Signed   By: Darrin Nipper M.D.   On: 02/06/2023 14:33    Microbiology: Results for orders placed or performed during the hospital encounter of 02/06/23  Blood culture (routine x 2)     Status: None   Collection Time: 02/06/23  1:33 PM   Specimen: BLOOD RIGHT WRIST  Result Value Ref Range Status   Specimen Description   Final    BLOOD RIGHT WRIST Performed at [facility] [address], [place], [place] [address]    Special Requests   Final    BOTTLES DRAWN AEROBIC AND ANAEROBIC Blood Culture results may not be optimal due to an excessive volume of blood received in culture  bottles Performed at [facility] [address] [place], [place] [address]    Culture   Final    NO GROWTH 5 DAYS Performed at [facility], [place] [address], [place], [place] [address]    Report Status 02/11/2023 FINAL  Final  Resp panel by RT-PCR (RSV, Flu A&B, Covid) Anterior Nasal Swab     Status: None   Collection Time: 02/06/23  2:20 PM   Specimen: Anterior Nasal Swab  Result Value Ref Range Status   SARS Coronavirus 2 by RT PCR NEGATIVE NEGATIVE Final    Comment: (NOTE) SARS-CoV-2 target nucleic acids are NOT DETECTED.  The SARS-CoV-2 RNA is generally detectable in upper respiratory specimens during the acute phase of infection. The lowest concentration of SARS-CoV-2 viral copies this assay can detect is 138 copies/mL. A negative result does not preclude SARS-Cov-2 infection and should not be used as the sole basis for treatment or other patient management decisions. A negative result may occur with  improper specimen collection/handling, submission of specimen other than nasopharyngeal swab, presence of viral mutation(s) within the areas targeted by this assay, and inadequate number of viral copies(<138 copies/mL). A negative result must be combined with clinical observations, patient history, and epidemiological information. The expected result is Negative.  Fact Sheet for Patients:  EntrepreneurPulse.com.au  Fact Sheet for Healthcare Providers:  IncredibleEmployment.be  This test is no t yet approved or cleared by the [place] FDA and  has been authorized for detection and/or diagnosis of SARS-CoV-2 by FDA under an Emergency Use Authorization (EUA). This EUA will remain  in effect (meaning this test can be used) for the duration of the COVID-19 declaration under Section 564(b)(1) of the Act, 21 U.S.C.section 360bbb-3(b)(1), unless the authorization is terminated  or revoked sooner.       Influenza  A by PCR NEGATIVE NEGATIVE Final   Influenza B by PCR NEGATIVE NEGATIVE Final    Comment: (NOTE) The Xpert Xpress SARS-CoV-2/FLU/RSV plus assay is intended as an aid in the diagnosis of influenza from Nasopharyngeal swab specimens and should not be used as a sole basis for treatment. Nasal washings and aspirates are unacceptable for Xpert Xpress SARS-CoV-2/FLU/RSV testing.  Fact Sheet for Patients: EntrepreneurPulse.com.au  Fact Sheet for Healthcare Providers: IncredibleEmployment.be  This test is not yet approved or cleared by the [place] FDA and has been authorized for detection and/or diagnosis of SARS-CoV-2 by FDA under an Emergency Use Authorization (EUA). This EUA will remain in effect (meaning this test can be used) for the duration of the COVID-19 declaration under Section 564(b)(1) of the Act, 21  U.S.C. section 360bbb-3(b)(1), unless the authorization is terminated or revoked.     Resp Syncytial Virus by PCR NEGATIVE NEGATIVE Final    Comment: (NOTE) Fact Sheet for Patients: EntrepreneurPulse.com.au  Fact Sheet for Healthcare Providers: IncredibleEmployment.be  This test is not yet approved or cleared by the [place] FDA and has been authorized for detection and/or diagnosis of SARS-CoV-2 by FDA under an Emergency Use Authorization (EUA). This EUA will remain in effect (meaning this test can be used) for the duration of the COVID-19 declaration under Section 564(b)(1) of the Act, 21 U.S.C. section 360bbb-3(b)(1), unless the authorization is terminated or revoked.  Performed at [facility], [place] [address], [place], [place] [address]   Blood culture (routine x 2)     Status: None   Collection Time: 02/06/23  2:20 PM   Specimen: BLOOD LEFT FOREARM  Result Value Ref Range Status   Specimen Description   Final    BLOOD LEFT FOREARM Performed at [facility], [place] [address], [place], [place] [address]    Special Requests   Final    BOTTLES DRAWN AEROBIC AND ANAEROBIC Blood Culture results may not be optimal due to an inadequate volume of blood received in culture bottles Performed at [facility] [address], [place], [place] [address]    Culture   Final    NO GROWTH 5 DAYS Performed at [facility], [place] [address] [place], [place] [address]    Report Status 02/11/2023 FINAL  Final  Respiratory (~20 pathogens) panel by PCR     Status: Abnormal   Collection Time: 02/06/23  4:08 PM   Specimen: Nasopharyngeal Swab; Respiratory  Result Value Ref Range Status   Adenovirus NOT DETECTED NOT DETECTED Final   Coronavirus 229E NOT DETECTED NOT DETECTED Final    Comment: (NOTE) The Coronavirus on the Respiratory Panel, DOES NOT test for the novel  Coronavirus (2019 nCoV)    Coronavirus HKU1 NOT DETECTED NOT DETECTED Final   Coronavirus NL63 NOT DETECTED NOT DETECTED Final   Coronavirus OC43 NOT DETECTED NOT DETECTED Final   Metapneumovirus DETECTED (A) NOT DETECTED Final   Rhinovirus / Enterovirus NOT DETECTED NOT DETECTED Final   Influenza A NOT DETECTED NOT DETECTED Final   Influenza B NOT DETECTED NOT DETECTED Final   Parainfluenza Virus 1 NOT DETECTED NOT DETECTED Final   Parainfluenza Virus 2 NOT DETECTED NOT DETECTED Final   Parainfluenza Virus 3 NOT DETECTED NOT DETECTED Final   Parainfluenza Virus 4 NOT DETECTED NOT DETECTED Final   Respiratory Syncytial Virus NOT DETECTED NOT DETECTED Final   Bordetella pertussis NOT DETECTED NOT DETECTED Final   Bordetella Parapertussis NOT DETECTED NOT DETECTED Final   Chlamydophila pneumoniae NOT DETECTED NOT DETECTED Final   Mycoplasma pneumoniae NOT DETECTED NOT DETECTED Final    Comment: Performed at [facility], [place] [address], [place], [place] [address]  Expectorated Sputum Assessment w Gram Stain, Rflx to Resp Cult     Status: None   Collection Time:  02/07/23  1:34 AM   Specimen: Expectorated Sputum  Result Value Ref Range Status   Specimen Description EXPECTORATED SPUTUM  Final   Special Requests NONE  Final   Sputum evaluation   Final    THIS SPECIMEN IS ACCEPTABLE FOR SPUTUM CULTURE Performed at [facility], [place] [address] [place], [place] [address]    Report Status 02/07/2023 FINAL  Final  Culture, Respiratory w Gram Stain  Status: None   Collection Time: 02/07/23  1:34 AM  Result Value Ref Range Status   Specimen Description   Final    EXPECTORATED SPUTUM Performed at [facility] [address], [place], [place] [address]    Special Requests   Final    NONE Reflexed from [id number] Performed at [facility], [place] [address] [place] [address] [place], [place] [address]    Gram Stain   Final    FEW WBC PRESENT, PREDOMINANTLY MONONUCLEAR FEW GRAM POSITIVE COCCI IN PAIRS RARE GRAM POSITIVE RODS Performed at [facility], [place] [address] [place], [place] [address]    Culture ABUNDANT STREPTOCOCCUS PNEUMONIAE  Final   Report Status 02/10/2023 FINAL  Final   Organism ID, Bacteria STREPTOCOCCUS PNEUMONIAE  Final      Susceptibility   Streptococcus pneumoniae - MIC*    ERYTHROMYCIN >=8 RESISTANT Resistant     LEVOFLOXACIN 1 SENSITIVE Sensitive     VANCOMYCIN 0.5 SENSITIVE Sensitive     PENICILLIN (meningitis) 0.25 RESISTANT Resistant     PENO - penicillin 0.25      PENICILLIN (non-meningitis) 0.25 SENSITIVE Sensitive     PENICILLIN (oral) 0.25 INTERMEDIATE Intermediate     CEFTRIAXONE (non-meningitis) <=0.12 SENSITIVE Sensitive     CEFTRIAXONE (meningitis) <=0.12 SENSITIVE Sensitive     * ABUNDANT STREPTOCOCCUS PNEUMONIAE    Labs: CBC: Recent Labs  Lab 02/06/23 1333 02/07/23 0427 02/08/23 0436 02/09/23 0434 02/10/23 0502  WBC 8.8 8.1 8.7 12.6* 15.3*  HGB 12.8 12.3 11.7* 12.3 11.8*  HCT 40.0 38.9 37.1 39.0 36.8  MCV 78.9* 80.7 79.4* 80.6 78.3*  PLT 564* 441* 430*  447* [id number]   Basic Metabolic Panel: Recent Labs  Lab 02/06/23 1333 02/07/23 0427 02/08/23 0436 02/09/23 0434 02/10/23 1448  NA 136 139 139 137 137  K 3.3* 4.2 3.1* 5.2* 4.3  CL 105 109 110 106 104  CO2 22 21* '24 24 24  '$ GLUCOSE 87 141* 72 146* 104*  BUN '17 14 15 16 '$ 24*  CREATININE 1.05* 0.88 0.89 0.91 0.94  CALCIUM 8.6* 8.5* 8.1* 8.4* 9.0   Liver Function Tests: Recent Labs  Lab 02/06/23 1333 02/07/23 0427 02/08/23 0436 02/09/23 0434  AST '21 23 21 28  '$ ALT '14 15 16 17  '$ ALKPHOS 57 56 46 48  BILITOT 0.4 0.5 0.5 0.6  PROT 6.3* 6.2* 5.7* 6.3*  ALBUMIN 3.7 3.3* 3.0* 3.2*   CBG: Recent Labs  Lab 02/09/23 2146 02/10/23 0732 02/10/23 1204 02/10/23 1543 02/11/23 0745  GLUCAP 169* 140* 193* 127* 81    Discharge time spent: greater than 30 minutes.  Signed: Elmarie Shiley, MD [facility] 02/11/2023

## 2023-02-15 DIAGNOSIS — Z09 Encounter for follow-up examination after completed treatment for conditions other than malignant neoplasm: Secondary | ICD-10-CM | POA: Diagnosis not present

## 2023-02-15 DIAGNOSIS — J849 Interstitial pulmonary disease, unspecified: Secondary | ICD-10-CM | POA: Diagnosis not present

## 2023-02-15 DIAGNOSIS — J440 Chronic obstructive pulmonary disease with acute lower respiratory infection: Secondary | ICD-10-CM | POA: Diagnosis not present

## 2023-02-15 DIAGNOSIS — M059 Rheumatoid arthritis with rheumatoid factor, unspecified: Secondary | ICD-10-CM | POA: Diagnosis not present

## 2023-02-15 DIAGNOSIS — J432 Centrilobular emphysema: Secondary | ICD-10-CM | POA: Diagnosis not present

## 2023-02-15 DIAGNOSIS — E782 Mixed hyperlipidemia: Secondary | ICD-10-CM | POA: Diagnosis not present

## 2023-02-15 DIAGNOSIS — D473 Essential (hemorrhagic) thrombocythemia: Secondary | ICD-10-CM | POA: Diagnosis not present

## 2023-02-15 DIAGNOSIS — J4520 Mild intermittent asthma, uncomplicated: Secondary | ICD-10-CM | POA: Diagnosis not present

## 2023-02-15 DIAGNOSIS — N182 Chronic kidney disease, stage 2 (mild): Secondary | ICD-10-CM | POA: Diagnosis not present

## 2023-02-16 ENCOUNTER — Encounter: Payer: Self-pay | Admitting: Adult Health

## 2023-02-16 ENCOUNTER — Ambulatory Visit (INDEPENDENT_AMBULATORY_CARE_PROVIDER_SITE_OTHER): Payer: Medicare Other | Admitting: Adult Health

## 2023-02-16 VITALS — BP 124/70 | HR 101 | Temp 98.4°F | Ht 62.0 in | Wt 167.4 lb

## 2023-02-16 DIAGNOSIS — J383 Other diseases of vocal cords: Secondary | ICD-10-CM

## 2023-02-16 DIAGNOSIS — J4489 Other specified chronic obstructive pulmonary disease: Secondary | ICD-10-CM | POA: Diagnosis not present

## 2023-02-16 MED ORDER — PREDNISONE 10 MG PO TABS: ORAL_TABLET | ORAL | 0 refills | Status: DC

## 2023-02-16 NOTE — Patient Instructions (Addendum)
Finish antibiotics as planned  Prednisone '20mg'$  daily for 5 days then '10mg'$  daily for 5 days and then stop .  Continue on Symbicort 2 puffs Twice daily, rinse  after use.  Continue on Duoneb neb Four times a day .  Delsym 2 tsp Twice daily As needed  cough .  Continue on Flonase and Zyrtec daily  Follow up with Dr. Lamonte Sakai in 6 weeks and As needed   Please contact office for sooner follow up if symptoms do not improve or worsen or seek emergency care

## 2023-02-16 NOTE — Progress Notes (Signed)
@Patient  ID: [name], female    DOB: 1945/12/25, 77 y.o.   MRN: [id number]  Chief Complaint  Patient presents with   Hospitalization Follow-up    Referring provider: Merrilee Seashore, MD  HPI: 77 year old female followed for asthma and COPD, vocal cord dysfunction and chronic cough Medical history significant for rheumatoid arthritis with associated mild interstitial lung disease  TEST/EVENTS :  PFT March 2018 showed FEV1 86%, ratio 61, FVC 110% significant positive bronchodilator response , DLCO 90% (prebronchodilator FEV1 65%, ratio 56, FVC 89%)   PFTs May 2021 showed severe airflow obstruction with FEV1 at 57%, ratio 50, FVC 88%, DLCO 83%, no significant bronchodilator response  PFTs October 14, 2021 showed FEV1 at 89%, ratio 61, FVC 112%, no significant bronchodilator response, diffusing capacity 86%.   CBC 05/19/2020-WBC 6, eos 1%, absolute eosinophil count 60 IgE 06/12/2017-9    ILD workup 06/2020 -  -CT chest -few patchy areas of peripheral predominant groundglass attenuation, septal thickening and mild subpleural reticulation most evident in the upper lobes.  Mild emphysema. ANA + 1:40 , cytoplasmic , HSP neg , AntiSleroderma ab, ANCA , SS ab, MyoMarker profile neg  Alpha 1 nml   02/16/2023 Follow up : Asthma/COPD , ILD , [facility]  Patient returns for a posthospital follow-up.  Patient was admitted last week for a COPD/asthma exacerbation in the setting of metapneumovirus and Streptococcus bronchitis patient was admitted with severe coughing and wheezing.  Chest x-ray showed no acute process.  Sputum culture was positive for abundant Streptococcus pneumonia  .  Patient was treated with.  Antibiotics, nebulized bronchodilators.  She was discharged on Omnicef.  She was treated with IV steroids and discharged on a prednisone taper.  Patient says she is better but continues to have intermittent cough and wheezing.  She was continued on Symbicort.  Patient has been  unable to afford Breztri or triple therapy maintenance inhaler.  She is getting Symbicort through patient assistance.  She was started on DuoNebs at discharge 4 times a day.  She feels that this is really helped her.  She has 1 day left of antibiotics.  And is on her last day of steroids.  She denies any hemoptysis, chest pain, orthopnea.  No edema.  Allergies  Allergen Reactions   Statins Other (See Comments)    Leg cramps   Penicillins Itching and Rash    Did it involve swelling of the face/tongue/throat, SOB, or low BP? N Did it involve sudden or severe rash/hives, skin peeling, or any reaction on the inside of your mouth or nose? Y Did you need to seek medical attention at a hospital or doctor's office? N When did it last happen? Decades Ago      If all above answers are "NO", may proceed with cephalosporin use.     Pravastatin Other (See Comments)    leg cramps    Immunization History  Administered Date(s) Administered   DTaP 03/20/2009   Fluad Quad(high Dose 65+) 09/16/2019, 08/17/2022   Influenza Split 11/10/2011, 10/10/2012, 09/17/2018   Influenza Whole 10/19/2006, 09/03/2008, 10/05/2009, 09/07/2010   Influenza, High Dose Seasonal PF 08/27/2014, 09/15/2016, 09/05/2017   Influenza, Quadrivalent, Recombinant, Inj, Pf 08/21/2017, 09/17/2018, 09/16/2019, 08/27/2020, 09/23/2021   Influenza,inj,Quad PF,6+ Mos 09/11/2013, 08/08/2014, 09/14/2015, 09/05/2016   Influenza-Unspecified 10/05/2018   Moderna SARS-COV2 Booster Vaccination 09/29/2020   Moderna Sars-Covid-2 Vaccination 01/27/2020, 02/25/2020   PNEUMOCOCCAL CONJUGATE-20 05/09/2022   Pneumococcal Conjugate-13 11/05/2014   Pneumococcal Polysaccharide-23 03/01/2006, 12/29/2016   Td 03/20/2009  Zoster, Live 10/19/2006    Past Medical History:  Diagnosis Date   Allergic rhinitis    Allergy    seasonal allergies   Anemia    hx of   Asthma    on meds   Cataract    bilateral sx    Chronic bronchitis (HCC)    Colonic  polyp    DDD (degenerative disc disease), lumbar 07/20/2011   DJD (degenerative joint disease) of knee    bilateral   DJD (degenerative joint disease), lumbar 07/20/2011   Esophageal stricture    GERD (gastroesophageal reflux disease)    on meds   Hiatal hernia    Hyperlipidemia    on meds   Osteoporosis    Stroke [facility]) 2017   "didn't know I'd had one before I was told; didn't do any damage" (11/24/2016)   Vocal cord dysfunction     Tobacco History: Social History   Tobacco Use  Smoking Status Former   Packs/day: 0.10   Years: 30.00   Additional pack years: 0.00   Total pack years: 3.00   Types: Cigarettes   Start date: 01/31/1975   Quit date: 08/09/1995   Years since quitting: 27.5  Smokeless Tobacco Never   Counseling given: Not Answered   Outpatient Medications Prior to Visit  Medication Sig Dispense Refill   acetaminophen (TYLENOL) 500 MG tablet Take 500-1,000 mg by mouth every 6 (six) hours as needed (pain.).     albuterol (PROVENTIL HFA) 108 (90 Base) MCG/ACT inhaler Inhale 1-2 puffs into the lungs every 6 (six) hours as needed for wheezing or shortness of breath. 24 g 3   AMBULATORY NON FORMULARY MEDICATION Medication Name: oral iron once daily     aspirin EC 81 MG tablet Take 81 mg by mouth in the morning.     budesonide-formoterol (SYMBICORT) 160-4.5 MCG/ACT inhaler Inhale 2 puffs into the lungs in the morning and at bedtime. 30.6 g 3   CALCIUM-CHOLECALCIFEROL PO Take 1 tablet by mouth in the morning.     cefdinir (OMNICEF) 300 MG capsule Take 1 capsule (300 mg total) by mouth 2 (two) times daily for 5 days. 10 capsule 0   cetirizine (ZYRTEC ALLERGY) 10 MG tablet Take 1 tablet (10 mg total) by mouth daily. 30 tablet 5   cholecalciferol (VITAMIN D3) 25 MCG (1000 UNIT) tablet Take 1,000 Units by mouth daily.     fluticasone (FLONASE) 50 MCG/ACT nasal spray Place 1 spray into the nose 2 (two) times daily.     folic acid (FOLVITE) A999333 MCG tablet Take 400 mcg by mouth in  the morning.     gabapentin (NEURONTIN) 100 MG capsule Take 100 mg by mouth at bedtime.     guaiFENesin (MUCINEX) 600 MG 12 hr tablet Take 2 tablets (1,200 mg total) by mouth 2 (two) times daily. 30 tablet 1   hydroxyurea (HYDREA) 500 MG capsule Take 1 capsule (500 mg total) by mouth 3 (three) times daily before meals. 90 capsule 6   ipratropium (ATROVENT HFA) 17 MCG/ACT inhaler Inhale 2 puffs into the lungs every 6 (six) hours as needed for wheezing. 1 each 12   ipratropium (ATROVENT) 0.02 % nebulizer solution Take 2.5 mLs (0.5 mg total) by nebulization every 6 (six) hours. 75 mL 12   levalbuterol (XOPENEX) 0.63 MG/3ML nebulizer solution Take 3 mLs (0.63 mg total) by nebulization every 6 (six) hours. 3 mL 12   magnesium oxide (MAG-OX) 400 MG tablet Take 400 mg by mouth in the morning.  methotrexate (RHEUMATREX) 2.5 MG tablet Take 15 mg by mouth once a week. Take on Monday     montelukast (SINGULAIR) 10 MG tablet Take 1 tablet (10 mg total) by mouth at bedtime. 90 tablet 3   Multiple Vitamins-Minerals (CENTRUM SILVER 50+WOMEN PO) Take 1 tablet by mouth in the morning.     pantoprazole (PROTONIX) 20 MG tablet Take 20 mg by mouth 2 (two) times daily.     predniSONE (DELTASONE) 20 MG tablet Take 2 tablets (40 mg total) by mouth daily with breakfast for 5 days. 10 tablet 0   rosuvastatin (CRESTOR) 10 MG tablet Take 10 mg by mouth at bedtime.     vitamin B-12 (CYANOCOBALAMIN) 1000 MCG tablet Take 1,000 mcg by mouth in the morning and at bedtime.     No facility-administered medications prior to visit.     Review of Systems:   Constitutional:   No  weight loss, night sweats,  Fevers, chills,  +fatigue, or  lassitude.  HEENT:   No headaches,  Difficulty swallowing,  Tooth/dental problems, or  Sore throat,                No sneezing, itching, ear ache,  +nasal congestion, post nasal drip,   CV:  No chest pain,  Orthopnea, PND, swelling in lower extremities, anasarca, dizziness, palpitations,  syncope.   GI  No heartburn, indigestion, abdominal pain, nausea, vomiting, diarrhea, change in bowel habits, loss of appetite, bloody stools.   Resp: .  No chest wall deformity  Skin: no rash or lesions.  GU: no dysuria, change in color of urine, no urgency or frequency.  No flank pain, no hematuria   MS:  No joint pain or swelling.  No decreased range of motion.  No back pain.    Physical Exam  BP 124/70 (BP Location: Left Arm, Patient Position: Sitting, Cuff Size: Normal)   Pulse (!) 101   Temp 98.4 F (36.9 C) (Oral)   Ht 5\' 2"  (1.575 m)   Wt 167 lb 6.4 oz (75.9 kg)   SpO2 100%   BMI 30.62 kg/m   GEN: A/Ox3; pleasant , NAD, well nourished    HEENT:  [place]/AT,  NOSE-clear, THROAT-clear, no lesions, no postnasal drip or exudate noted.   NECK:  Supple w/ fair ROM; no JVD; normal carotid impulses w/o bruits; no thyromegaly or nodules palpated; no lymphadenopathy.    RESP few trace expiratory wheezes on forced expiration.  Speaks in full sentences.  No accessory muscle use, no dullness to percussion  CARD:  RRR, no m/r/g, no peripheral edema, pulses intact, no cyanosis or clubbing.  GI:   Soft & nt; nml bowel sounds; no organomegaly or masses detected.   Musco: Warm bil, no deformities or joint swelling noted.   Neuro: alert, no focal deficits noted.    Skin: Warm, no lesions or rashes    Lab Results:  CBC  BMET   BNP   Imaging: DG CHEST PORT 1 VIEW  Result Date: 02/08/2023 CLINICAL DATA:  77 year old female with shortness of breath. EXAM: PORTABLE CHEST 1 VIEW COMPARISON:  Portable chest 02/06/2023 and earlier. FINDINGS: Portable AP semi upright view at 0702 hours. Lung volumes remain within normal limits. Small gastric hiatal hernia again noted. Other mediastinal contours are within normal limits. Allowing for portable technique the lungs are clear. No pneumothorax or pleural effusions. Visualized tracheal air column is within normal limits. Paucity of bowel  gas in the visible abdomen. Stable visualized osseous structures. IMPRESSION: 1.  No acute cardiopulmonary abnormality. 2. Small hiatal hernia. Electronically Signed   By: Genevie Ann M.D.   On: 02/08/2023 07:56   DG Chest Port 1 View  Result Date: 02/06/2023 CLINICAL DATA:  Shortness of breath EXAM: PORTABLE CHEST 1 VIEW COMPARISON:  Chest radiograph dated 09/23/2022 FINDINGS: Normal lung volumes. Rounded retrocardiac density in keeping with known hiatal hernia. No pleural effusion or pneumothorax. The heart size and mediastinal contours are within normal limits. Degenerative changes of the bilateral glenohumeral joints. IMPRESSION: 1. No active disease. 2. Small hiatal hernia. Electronically Signed   By: Darrin Nipper M.D.   On: 02/06/2023 14:33         Latest Ref Rng & Units 10/14/2021   12:54 PM 04/24/2020   10:54 AM 02/03/2017    8:48 AM  PFT Results  FVC-Pre L 2.12  1.89  1.96   FVC-Predicted Pre % 108  88  89   FVC-Post L 2.18  1.76  2.44   FVC-Predicted Post % 112  82  110   Pre FEV1/FVC % % 62  50  56   Post FEV1/FCV % % 61  51  61   FEV1-Pre L 1.31  0.94  1.11   FEV1-Predicted Pre % 87  57  65   FEV1-Post L 1.34  0.90  1.48   DLCO uncorrected ml/min/mmHg 15.44  15.44  20.71   DLCO UNC% % 86  83  90   DLCO corrected ml/min/mmHg 15.44  15.44  19.42   DLCO COR %Predicted % 86  83  84   DLVA Predicted % 109  139  114   TLC L 5.24  4.67  6.30   TLC % Predicted % 110  95  128   RV % Predicted % 164  124  199     No results found for: "NITRICOXIDE"      Assessment & Plan:   No problem-specific Assessment & Plan notes found for this encounter.   I spent  42  minutes dedicated to the care of this patient on the date of this encounter to include pre-visit review of records, face-to-face time with the patient discussing conditions above, post visit ordering of testing, clinical documentation with the electronic health record, making appropriate referrals as documented, and communicating  necessary findings to members of the patients care team.    Rexene Edison, NP 02/16/2023

## 2023-02-17 NOTE — Assessment & Plan Note (Signed)
Recent exacerbation with Metapneumovirus and Streptococcus Pneumonia Bronchitis - clinically improved  Will have finish antibiotics and extend steroids out for slow taper    Plan  Patient Instructions  Finish antibiotics as planned  Prednisone 20mg  daily for 5 days then 10mg  daily for 5 days and then stop .  Continue on Symbicort 2 puffs Twice daily, rinse  after use.  Continue on Duoneb neb Four times a day .  Delsym 2 tsp Twice daily As needed  cough .  Continue on Flonase and Zyrtec daily  Follow up with Dr. Lamonte Sakai in 6 weeks and As needed   Please contact office for sooner follow up if symptoms do not improve or worsen or seek emergency care

## 2023-02-17 NOTE — Assessment & Plan Note (Signed)
Continue with Trigger prevention   Plan  Patient Instructions  Finish antibiotics as planned  Prednisone 20mg  daily for 5 days then 10mg  daily for 5 days and then stop .  Continue on Symbicort 2 puffs Twice daily, rinse  after use.  Continue on Duoneb neb Four times a day .  Delsym 2 tsp Twice daily As needed  cough .  Continue on Flonase and Zyrtec daily  Follow up with Dr. Lamonte Sakai in 6 weeks and As needed   Please contact office for sooner follow up if symptoms do not improve or worsen or seek emergency care

## 2023-03-30 ENCOUNTER — Ambulatory Visit (INDEPENDENT_AMBULATORY_CARE_PROVIDER_SITE_OTHER): Payer: Medicare Other | Admitting: Emergency Medicine

## 2023-03-30 ENCOUNTER — Encounter: Payer: Self-pay | Admitting: Emergency Medicine

## 2023-03-30 VITALS — BP 124/74 | HR 97 | Temp 98.5°F | Ht 62.0 in | Wt 161.8 lb

## 2023-03-30 DIAGNOSIS — J849 Interstitial pulmonary disease, unspecified: Secondary | ICD-10-CM | POA: Diagnosis not present

## 2023-03-30 DIAGNOSIS — J4489 Other specified chronic obstructive pulmonary disease: Secondary | ICD-10-CM | POA: Diagnosis not present

## 2023-03-30 DIAGNOSIS — J309 Allergic rhinitis, unspecified: Secondary | ICD-10-CM

## 2023-03-30 DIAGNOSIS — K219 Gastro-esophageal reflux disease without esophagitis: Secondary | ICD-10-CM

## 2023-03-30 MED ORDER — BUDESONIDE-FORMOTEROL FUMARATE 160-4.5 MCG/ACT IN AERO: 2.0000 | INHALATION_SPRAY | Freq: Two times a day (BID) | RESPIRATORY_TRACT | 3 refills | Status: DC

## 2023-03-30 MED ORDER — ALBUTEROL SULFATE HFA 108 (90 BASE) MCG/ACT IN AERS: 1.0000 | INHALATION_SPRAY | Freq: Four times a day (QID) | RESPIRATORY_TRACT | 3 refills | Status: DC | PRN

## 2023-03-30 NOTE — Patient Instructions (Addendum)
Please continue Symbicort 2 puffs twice a day.  Rinse and gargle after using. We will refill your albuterol inhaler.  Use 2 puffs up to every 4 hours if needed for shortness of breath, chest tightness, wheezing. Flu shot, COVID-19 vaccine, pneumonia vaccine are all up-to-date Continue your Zyrtec once daily Continue fluticasone nasal spray (Flonase) 2 puffs each nostril once daily.  If your congestion and sinus fullness flare you could consider increasing temporarily to 2 puffs each nostril twice a day. Continue Singulair 10 mg each evening Continue your Protonix twice a day as you have been taking it Follow with APP in 3 months Follow Dr. Delton Coombes in 6 months.  Please call sooner if you have any problems.

## 2023-03-30 NOTE — Assessment & Plan Note (Signed)
Continue your Protonix twice a day as you have been taking

## 2023-03-30 NOTE — Progress Notes (Signed)
  Subjective:    Patient ID: [name], female    DOB: 29-Mar-1946   MRN: [id number] HPI  ROV 08/25/2022 --[name] is 77 and has a history of rheumatoid arthritis (former methotrexate) with some subpleural mild interstitial lung disease, probable moderate persistent asthma but with superimposed upper airway irritation syndrome, VCD and chronic cough.  Often difficult to tell whether she is having upper or lower airway symptoms.  She has been managed for a few years on Breztri but having a difficult time obtaining it due to cost - she doesn't have any inhalers right now. She used to be on symbicort.  On zyrtec, nasal steroid, singulair, PPI bid.   ROV 03/30/23 --follow-up visit for 77 year old woman with RA, mild subpleural interstitial disease, moderate persistent asthma with superimposed upper airway irritation and vocal cord dysfunction.  She has chronic cough due to both of these issues.  She was admitted with dyspnea and wheezing in early March, last seen in our office 02/16/2023.  Currently managed on Symbicort, Zyrtec, Flonase, Singulair, pantoprazole twice daily.  Her rescue is albuterol. She still has atrovent HFA and nebs on her med list. Xopenex nebs Prn since the hospital. No steroids since hospital.  She reports today some nasal congestion, sinus HA. Her breathing and wheeze are improved. Decent functional capacity.    Objective:   Physical Exam Vitals:   03/30/23 0832  BP: 124/74  Pulse: 97  Temp: 98.5 F (36.9 C)  TempSrc: Oral  SpO2: 99%  Weight: 161 lb 12.8 oz (73.4 kg)  Height:  (1.575 m)   Gen: Pleasant, overwt, in no distress,  normal affect  ENT: No lesions,  mouth clear,  oropharynx clear, no postnasal drip, strong voice  Neck: supple, low pitched expiratory stridor  Lungs : distant, referred upper airway noise, otherwise clear  Cardiovascular: RRR, heart sounds normal, no murmur or gallops, no peripheral edema  Musculoskeletal: No deformities, no  cyanosis or clubbing  Neuro: alert, non focal  Skin: Warm, no rash    Assessment & Plan:   ILD (interstitial lung disease) (HCC) Following.  Will consider timing of repeat imaging later this year.  Asthma-COPD overlap syndrome Mild to moderate obstruction.  Her clinical history is consistent with moderate persistent asthma with superimposed VCD.  She is now back to Symbicort, gets financial assistance for this.  Plan to continue.  She has Xopenex/Atrovent nebs available that she was given after her hospital discharge.  Never required these.  Okay to keep them available for now.  She needs an albuterol inhaler and we will provide this  Please continue Symbicort 2 puffs twice a day.  Rinse and gargle after using. We will refill your albuterol inhaler.  Use 2 puffs up to every 4 hours if needed for shortness of breath, chest tightness, wheezing. Flu shot, COVID-19 vaccine, pneumonia vaccine are all up-to-date Follow with APP in 3 months Follow Dr. Delton Coombes in 6 months.  Please call sooner if you have any problems.  Allergic rhinitis Continue your Zyrtec once daily Continue fluticasone nasal spray (Flonase) 2 puffs each nostril once daily.  If your congestion and sinus fullness flare you could consider increasing temporarily to 2 puffs each nostril twice a day. Continue Singulair 10 mg each evening  Gastroesophageal reflux disease without esophagitis Continue your Protonix twice a day as you have been taking    Levy Pupa, MD, PhD 03/30/2023, 8:52 AM [facility] [phone] or if no answer [phone]

## 2023-03-30 NOTE — Assessment & Plan Note (Signed)
Continue your Zyrtec once daily Continue fluticasone nasal spray (Flonase) 2 puffs each nostril once daily.  If your congestion and sinus fullness flare you could consider increasing temporarily to 2 puffs each nostril twice a day. Continue Singulair 10 mg each evening

## 2023-03-30 NOTE — Assessment & Plan Note (Signed)
Following.  Will consider timing of repeat imaging later this year.

## 2023-03-30 NOTE — Assessment & Plan Note (Signed)
Mild to moderate obstruction.  Her clinical history is consistent with moderate persistent asthma with superimposed VCD.  She is now back to Symbicort, gets financial assistance for this.  Plan to continue.  She has Xopenex/Atrovent nebs available that she was given after her hospital discharge.  Never required these.  Okay to keep them available for now.  She needs an albuterol inhaler and we will provide this  Please continue Symbicort 2 puffs twice a day.  Rinse and gargle after using. We will refill your albuterol inhaler.  Use 2 puffs up to every 4 hours if needed for shortness of breath, chest tightness, wheezing. Flu shot, COVID-19 vaccine, pneumonia vaccine are all up-to-date Follow with APP in 3 months Follow Dr. Delton Coombes in 6 months.  Please call sooner if you have any problems.

## 2023-05-23 DIAGNOSIS — M059 Rheumatoid arthritis with rheumatoid factor, unspecified: Secondary | ICD-10-CM | POA: Diagnosis not present

## 2023-05-23 DIAGNOSIS — J849 Interstitial pulmonary disease, unspecified: Secondary | ICD-10-CM | POA: Diagnosis not present

## 2023-05-23 DIAGNOSIS — R7303 Prediabetes: Secondary | ICD-10-CM | POA: Diagnosis not present

## 2023-05-23 DIAGNOSIS — I700 Atherosclerosis of aorta: Secondary | ICD-10-CM | POA: Diagnosis not present

## 2023-05-23 DIAGNOSIS — G4762 Sleep related leg cramps: Secondary | ICD-10-CM | POA: Diagnosis not present

## 2023-05-23 DIAGNOSIS — D473 Essential (hemorrhagic) thrombocythemia: Secondary | ICD-10-CM | POA: Diagnosis not present

## 2023-05-23 DIAGNOSIS — J432 Centrilobular emphysema: Secondary | ICD-10-CM | POA: Diagnosis not present

## 2023-05-30 DIAGNOSIS — M79604 Pain in right leg: Secondary | ICD-10-CM | POA: Diagnosis not present

## 2023-05-30 DIAGNOSIS — Z0000 Encounter for general adult medical examination without abnormal findings: Secondary | ICD-10-CM | POA: Diagnosis not present

## 2023-05-30 DIAGNOSIS — G4762 Sleep related leg cramps: Secondary | ICD-10-CM | POA: Diagnosis not present

## 2023-05-30 DIAGNOSIS — M059 Rheumatoid arthritis with rheumatoid factor, unspecified: Secondary | ICD-10-CM | POA: Diagnosis not present

## 2023-05-30 DIAGNOSIS — M25561 Pain in right knee: Secondary | ICD-10-CM | POA: Diagnosis not present

## 2023-05-30 DIAGNOSIS — E782 Mixed hyperlipidemia: Secondary | ICD-10-CM | POA: Diagnosis not present

## 2023-05-30 DIAGNOSIS — D473 Essential (hemorrhagic) thrombocythemia: Secondary | ICD-10-CM | POA: Diagnosis not present

## 2023-05-30 DIAGNOSIS — I700 Atherosclerosis of aorta: Secondary | ICD-10-CM | POA: Diagnosis not present

## 2023-05-30 DIAGNOSIS — J849 Interstitial pulmonary disease, unspecified: Secondary | ICD-10-CM | POA: Diagnosis not present

## 2023-05-30 DIAGNOSIS — N182 Chronic kidney disease, stage 2 (mild): Secondary | ICD-10-CM | POA: Diagnosis not present

## 2023-05-30 DIAGNOSIS — J432 Centrilobular emphysema: Secondary | ICD-10-CM | POA: Diagnosis not present

## 2023-05-30 DIAGNOSIS — G250 Essential tremor: Secondary | ICD-10-CM | POA: Diagnosis not present

## 2023-06-07 ENCOUNTER — Other Ambulatory Visit: Payer: Self-pay | Admitting: *Deleted

## 2023-06-07 DIAGNOSIS — D473 Essential (hemorrhagic) thrombocythemia: Secondary | ICD-10-CM

## 2023-06-09 ENCOUNTER — Inpatient Hospital Stay (HOSPITAL_BASED_OUTPATIENT_CLINIC_OR_DEPARTMENT_OTHER): Payer: Medicare Other | Admitting: Hematology & Oncology

## 2023-06-09 ENCOUNTER — Inpatient Hospital Stay: Payer: Medicare Other | Attending: Hematology & Oncology

## 2023-06-09 ENCOUNTER — Other Ambulatory Visit: Payer: Self-pay

## 2023-06-09 ENCOUNTER — Other Ambulatory Visit (HOSPITAL_BASED_OUTPATIENT_CLINIC_OR_DEPARTMENT_OTHER): Payer: Self-pay

## 2023-06-09 DIAGNOSIS — D473 Essential (hemorrhagic) thrombocythemia: Secondary | ICD-10-CM | POA: Insufficient documentation

## 2023-06-09 DIAGNOSIS — Z7982 Long term (current) use of aspirin: Secondary | ICD-10-CM | POA: Diagnosis not present

## 2023-06-09 DIAGNOSIS — Z1589 Genetic susceptibility to other disease: Secondary | ICD-10-CM | POA: Diagnosis not present

## 2023-06-09 DIAGNOSIS — D573 Sickle-cell trait: Secondary | ICD-10-CM

## 2023-06-09 DIAGNOSIS — Z79899 Other long term (current) drug therapy: Secondary | ICD-10-CM | POA: Diagnosis not present

## 2023-06-09 LAB — CMP (CANCER CENTER ONLY)
ALT: 8 U/L (ref 0–44)
AST: 11 U/L — ABNORMAL LOW (ref 15–41)
Albumin: 3.6 g/dL (ref 3.5–5.0)
Alkaline Phosphatase: 65 U/L (ref 38–126)
Anion gap: 8 (ref 5–15)
BUN: 11 mg/dL (ref 8–23)
CO2: 29 mmol/L (ref 22–32)
Calcium: 8.8 mg/dL — ABNORMAL LOW (ref 8.9–10.3)
Chloride: 106 mmol/L (ref 98–111)
Creatinine: 1.03 mg/dL — ABNORMAL HIGH (ref 0.44–1.00)
GFR, Estimated: 56 mL/min — ABNORMAL LOW (ref >60)
Glucose, Bld: 99 mg/dL (ref 70–99)
Potassium: 3.7 mmol/L (ref 3.5–5.1)
Sodium: 143 mmol/L (ref 135–145)
Total Bilirubin: 0.5 mg/dL (ref 0.3–1.2)
Total Protein: 6 g/dL — ABNORMAL LOW (ref 6.5–8.1)

## 2023-06-09 LAB — CBC WITH DIFFERENTIAL (CANCER CENTER ONLY)
Abs Immature Granulocytes: 0.13 10*3/uL — ABNORMAL HIGH (ref 0.00–0.07)
Basophils Absolute: 0.1 10*3/uL (ref 0.0–0.1)
Basophils Relative: 1 %
Eosinophils Absolute: 0.2 10*3/uL (ref 0.0–0.5)
Eosinophils Relative: 2 %
HCT: 42.8 % (ref 36.0–46.0)
Hemoglobin: 13.5 g/dL (ref 12.0–15.0)
Immature Granulocytes: 1 %
Lymphocytes Relative: 25 %
Lymphs Abs: 2.5 10*3/uL (ref 0.7–4.0)
MCH: 24.2 pg — ABNORMAL LOW (ref 26.0–34.0)
MCHC: 31.5 g/dL (ref 30.0–36.0)
MCV: 76.6 fL — ABNORMAL LOW (ref 80.0–100.0)
Monocytes Absolute: 0.8 10*3/uL (ref 0.1–1.0)
Monocytes Relative: 8 %
Neutro Abs: 6.4 10*3/uL (ref 1.7–7.7)
Neutrophils Relative %: 63 %
Platelet Count: 650 10*3/uL — ABNORMAL HIGH (ref 150–400)
RBC: 5.59 MIL/uL — ABNORMAL HIGH (ref 3.87–5.11)
RDW: 15.5 % (ref 11.5–15.5)
WBC Count: 10 10*3/uL (ref 4.0–10.5)
nRBC: 0 % (ref 0.0–0.2)

## 2023-06-09 LAB — IRON AND IRON BINDING CAPACITY (CC-WL,HP ONLY)
Iron: 53 ug/dL (ref 28–170)
Saturation Ratios: 22 % (ref 10.4–31.8)
TIBC: 241 ug/dL — ABNORMAL LOW (ref 250–450)
UIBC: 188 ug/dL (ref 148–442)

## 2023-06-09 LAB — LACTATE DEHYDROGENASE: LDH: 221 U/L — ABNORMAL HIGH (ref 98–192)

## 2023-06-09 LAB — FERRITIN: Ferritin: 57 ng/mL (ref 11–307)

## 2023-06-09 LAB — SAVE SMEAR(SSMR), FOR PROVIDER SLIDE REVIEW

## 2023-06-09 MED ORDER — HYDROXYUREA 500 MG PO CAPS
500.0000 mg | ORAL_CAPSULE | Freq: Three times a day (TID) | ORAL | 6 refills | Status: DC
  Filled 2023-06-09: qty 90, 30d supply, fill #0
  Filled 2023-08-02: qty 90, 30d supply, fill #1
  Filled 2023-09-18: qty 90, 30d supply, fill #2
  Filled 2023-10-30: qty 90, 30d supply, fill #3
  Filled 2023-12-13: qty 90, 30d supply, fill #4
  Filled 2024-02-13: qty 90, 30d supply, fill #5
  Filled 2024-03-28: qty 90, 30d supply, fill #6

## 2023-06-09 NOTE — Progress Notes (Signed)
Hematology and Oncology Follow Up Visit  [name] [id number] Oct 15, 1946 77 y.o. 06/09/2023   Principle Diagnosis:  Essential thrombocythemia - JAK2 POSITIVE Sickle cell trait  Current Therapy:   Hydrea 500 mg PO TID - dose changed on 05/20/2019 Folic acid 1 mg by mouth daily Aspirin 81 mg daily    Interim History:  [name] is here today for follow-up.  It has been a year and a half since we last saw her.  I am unsure as to why it is taking so long for her to come back..  She has been out of Hydrea for probably a week or so.  Her platelet count is much higher.  Her platelet count is 650,000.  We will get her back onto the Hydrea.  She did have upper and lower endoscopy last year.  In February last year she had a upper endoscopy which showed a sliding hiatal hernia.  There is nothing that was noted in the stomach that was suspicious.  Patient was had a colonoscopy.  This was in October of last year.  Multiple biopsies were taken polyps were removed.  There were no malignant polyps.  Again, she is careful with the hot, humid weather.  She stays inside.  She has had no problems with nausea or vomiting.  She has had no problems with the sickle cell trait.  She is on the folic acid.  She does take her aspirin.  She has had no issues with COVID.  Overall, I would say that her performance status is probably ECOG 1.      Medications:  Allergies as of 06/09/2023       Reactions   Statins Other (See Comments)   Leg cramps   Penicillins Itching, Rash   Did it involve swelling of the face/tongue/throat, SOB, or low BP? N Did it involve sudden or severe rash/hives, skin peeling, or any reaction on the inside of your mouth or nose? Y Did you need to seek medical attention at a hospital or doctor's office? N When did it last happen? Decades Ago      If all above answers are "NO", may proceed with cephalosporin use.   Pravastatin Other (See Comments)   leg cramps         Medication List        Accurate as of June 09, 2023 10:47 AM. If you have any questions, ask your nurse or doctor.          acetaminophen 500 MG tablet Commonly known as: TYLENOL Take 500-1,000 mg by mouth every 6 (six) hours as needed (pain.).   albuterol 108 (90 Base) MCG/ACT inhaler Commonly known as: Proventil HFA Inhale 1-2 puffs into the lungs every 6 (six) hours as needed for wheezing or shortness of breath.   AMBULATORY NON FORMULARY MEDICATION Medication Name: oral iron once daily   aspirin EC 81 MG tablet Take 81 mg by mouth in the morning.   budesonide-formoterol 160-4.5 MCG/ACT inhaler Commonly known as: Symbicort Inhale 2 puffs into the lungs in the morning and at bedtime.   CALCIUM-CHOLECALCIFEROL PO Take 1 tablet by mouth in the morning.   CENTRUM SILVER 50+WOMEN PO Take 1 tablet by mouth in the morning.   cetirizine 10 MG tablet Commonly known as: ZyrTEC Allergy Take 1 tablet (10 mg total) by mouth daily.   cholecalciferol 25 MCG (1000 UNIT) tablet Commonly known as: VITAMIN D3 Take 1,000 Units by mouth daily.   cyanocobalamin 1000 MCG tablet Commonly  known as: VITAMIN B12 Take 1,000 mcg by mouth in the morning and at bedtime.   fluticasone 50 MCG/ACT nasal spray Commonly known as: FLONASE Place 1 spray into the nose 2 (two) times daily.   folic acid 400 MCG tablet Commonly known as: FOLVITE Take 400 mcg by mouth in the morning.   gabapentin 100 MG capsule Commonly known as: NEURONTIN Take 100 mg by mouth at bedtime.   guaiFENesin 600 MG 12 hr tablet Commonly known as: MUCINEX Take 2 tablets (1,200 mg total) by mouth 2 (two) times daily.   hydroxyurea 500 MG capsule Commonly known as: HYDREA Take 1 capsule (500 mg total) by mouth 3 (three) times daily before meals.   ipratropium 0.02 % nebulizer solution Commonly known as: ATROVENT Take 2.5 mLs (0.5 mg total) by nebulization every 6 (six) hours.   ipratropium 17 MCG/ACT  inhaler Commonly known as: ATROVENT HFA Inhale 2 puffs into the lungs every 6 (six) hours as needed for wheezing.   levalbuterol 0.63 MG/3ML nebulizer solution Commonly known as: XOPENEX Take 3 mLs (0.63 mg total) by nebulization every 6 (six) hours.   magnesium oxide 400 MG tablet Commonly known as: MAG-OX Take 400 mg by mouth in the morning.   methotrexate 2.5 MG tablet Commonly known as: RHEUMATREX Take 15 mg by mouth once a week. Take on Monday   montelukast 10 MG tablet Commonly known as: SINGULAIR Take 1 tablet (10 mg total) by mouth at bedtime.   pantoprazole 20 MG tablet Commonly known as: PROTONIX Take 20 mg by mouth 2 (two) times daily.   predniSONE 10 MG tablet Commonly known as: DELTASONE 2 tabs for 5 days, then 1 tab for 5 days, then stop   rosuvastatin 10 MG tablet Commonly known as: CRESTOR Take 10 mg by mouth at bedtime.        Allergies:  Allergies  Allergen Reactions   Statins Other (See Comments)    Leg cramps   Penicillins Itching and Rash    Did it involve swelling of the face/tongue/throat, SOB, or low BP? N Did it involve sudden or severe rash/hives, skin peeling, or any reaction on the inside of your mouth or nose? Y Did you need to seek medical attention at a hospital or doctor's office? N When did it last happen? Decades Ago      If all above answers are "NO", may proceed with cephalosporin use.     Pravastatin Other (See Comments)    leg cramps    Past Medical History, Surgical history, Social history, and Family History were reviewed and updated.  Review of Systems:   Review of Systems  Constitutional: Negative.   HENT: Negative.    Eyes: Negative.   Respiratory: Negative.    Cardiovascular: Negative.   Gastrointestinal: Negative.   Genitourinary: Negative.   Musculoskeletal: Negative.   Skin: Negative.   Neurological: Negative.   Endo/Heme/Allergies: Negative.   Psychiatric/Behavioral: Negative.       Physical  Exam:  height is 5\' 3"  (1.6 m) and weight is 157 lb (71.2 kg). Her oral temperature is 98.6 F (37 C). Her blood pressure is 129/79 and her pulse is 74. Her respiration is 16.   Wt Readings from Last 3 Encounters:  06/09/23 157 lb (71.2 kg)  03/30/23 161 lb 12.8 oz (73.4 kg)  02/16/23 167 lb 6.4 oz (75.9 kg)   Physical Activity: Inactive (03/01/2021)   Exercise Vital Sign    Days of Exercise per Week: 0 days  Minutes of Exercise per Session: 0 min     Physical Exam Vitals reviewed.  HENT:     Head: Normocephalic and atraumatic.  Eyes:     Pupils: Pupils are equal, round, and reactive to light.  Cardiovascular:     Rate and Rhythm: Normal rate and regular rhythm.     Heart sounds: Normal heart sounds.  Pulmonary:     Effort: Pulmonary effort is normal.     Breath sounds: Normal breath sounds.  Abdominal:     General: Bowel sounds are normal.     Palpations: Abdomen is soft.  Musculoskeletal:        General: No tenderness or deformity. Normal range of motion.     Cervical back: Normal range of motion.  Lymphadenopathy:     Cervical: No cervical adenopathy.  Skin:    General: Skin is warm and dry.     Findings: No erythema or rash.  Neurological:     Mental Status: She is alert and oriented to person, place, and time.  Psychiatric:        Behavior: Behavior normal.        Thought Content: Thought content normal.        Judgment: Judgment normal.      Lab Results  Component Value Date   WBC 10.0 06/09/2023   HGB 13.5 06/09/2023   HCT 42.8 06/09/2023   MCV 76.6 (L) 06/09/2023   PLT 650 (H) 06/09/2023   Lab Results  Component Value Date   FERRITIN 133 01/05/2022   IRON 58 01/05/2022   TIBC 265 01/05/2022   UIBC 207 01/05/2022   IRONPCTSAT 22 01/05/2022   Lab Results  Component Value Date   RETICCTPCT 0.8 06/20/2019   RBC 5.59 (H) 06/09/2023   RETICCTABS 73.1 08/06/2015   No results found for: "KPAFRELGTCHN", "LAMBDASER", "[facility]" Lab Results   Component Value Date   IGGSERUM 685 09/07/2020   IGA 293 09/07/2020   IGMSERUM 26 09/07/2020   No results found for: "TOTALPROTELP", "ALBUMINELP", "A1GS", "A2GS", "BETS", "BETA2SER", "GAMS", "MSPIKE", "SPEI"   Chemistry      Component Value Date/Time   NA 143 06/09/2023 0955   NA 145 11/24/2017 0754   NA 142 05/25/2017 0755   K 3.7 06/09/2023 0955   K 3.7 11/24/2017 0754   K 3.9 05/25/2017 0755   CL 106 06/09/2023 0955   CL 104 11/24/2017 0754   CO2 29 06/09/2023 0955   CO2 27 11/24/2017 0754   CO2 24 05/25/2017 0755   BUN 11 06/09/2023 0955   BUN 12 11/24/2017 0754   BUN 13.3 05/25/2017 0755   CREATININE 1.03 (H) 06/09/2023 0955   CREATININE 1.0 11/24/2017 0754   CREATININE 0.8 05/25/2017 0755      Component Value Date/Time   CALCIUM 8.8 (L) 06/09/2023 0955   CALCIUM 9.1 11/24/2017 0754   CALCIUM 9.5 05/25/2017 0755   ALKPHOS 65 06/09/2023 0955   ALKPHOS 70 11/24/2017 0754   ALKPHOS 74 05/25/2017 0755   AST 11 (L) 06/09/2023 0955   AST 21 05/25/2017 0755   ALT 8 06/09/2023 0955   ALT 18 11/24/2017 0754   ALT 14 05/25/2017 0755   BILITOT 0.5 06/09/2023 0955   BILITOT 0.37 05/25/2017 0755       Impression and Plan: Ms. Haen is a very pleasant 77 yo African American female with essential thrombocythemia, that is JAK2 positive.  In addition, she has the sickle cell trait.  Again, it has been a year and a  half since we saw her.  I know that she has had issues with the family.  I know this has been incredibly challenging for she and her family.  Regarding have to get her back in a month or so.  If everything looks fine with her platelets, and her platelet count is lower, then we can get her back on a more regular schedule.   Josph Macho, MD 7/5/202410:47 AM

## 2023-06-21 ENCOUNTER — Other Ambulatory Visit (HOSPITAL_COMMUNITY): Payer: Self-pay

## 2023-06-29 ENCOUNTER — Ambulatory Visit (INDEPENDENT_AMBULATORY_CARE_PROVIDER_SITE_OTHER): Payer: Medicare Other | Admitting: Adult Health

## 2023-06-29 ENCOUNTER — Encounter: Payer: Self-pay | Admitting: Adult Health

## 2023-06-29 ENCOUNTER — Other Ambulatory Visit: Payer: Self-pay | Admitting: *Deleted

## 2023-06-29 VITALS — BP 104/66 | HR 78 | Temp 98.1°F | Ht 62.0 in | Wt 158.6 lb

## 2023-06-29 DIAGNOSIS — J849 Interstitial pulmonary disease, unspecified: Secondary | ICD-10-CM

## 2023-06-29 DIAGNOSIS — J4551 Severe persistent asthma with (acute) exacerbation: Secondary | ICD-10-CM

## 2023-06-29 MED ORDER — PREDNISONE 10 MG PO TABS: ORAL_TABLET | ORAL | 0 refills | Status: DC

## 2023-06-29 NOTE — Patient Instructions (Addendum)
Prednisone taper over next week.  Sample of Breztri 2 puffs Twice daily , finish sample and then  Restart Symbicort 2 puffs Twice daily, rinse  after use.  Albuterol inhaler As needed   Restart Ipratropium and Xopenex neb Three times a day .  Delsym 2 tsp Twice daily As needed  cough .  Continue on Flonase and Zyrtec daily  Follow up with Dr. Delton Coombes in 3 months with Spirometry with DLCO  and As needed   Please contact office for sooner follow up if symptoms do not improve or worsen or seek emergency care

## 2023-06-29 NOTE — Progress Notes (Signed)
@Patient  ID: [name], female    DOB: 12/31/1945, 77 y.o.   MRN: [id number]  Chief Complaint  Patient presents with   Follow-up    Referring provider: Georgianne Fick, MD  HPI: 77 year old female with minimum smoking history followed for severe persistent asthma complicated by vocal cord dysfunction and chronic cough Medical history significant for rheumatoid arthritis with mild interstitial lung disease changes on CT chest (on methotrexate) Essential throbocythemia -JAK2 Positive , sickle cell trait followed by Hematology on Hydrea   TEST/EVENTS :  PFT March 2018 showed FEV1 86%, ratio 61, FVC 110% significant positive bronchodilator response , DLCO 90% (prebronchodilator FEV1 65%, ratio 56, FVC 89%)   PFTs May 2021 showed severe airflow obstruction with FEV1 at 57%, ratio 50, FVC 88%, DLCO 83%, no significant bronchodilator response   PFTs October 14, 2021 showed FEV1 at 89%, ratio 61, FVC 112%, no significant bronchodilator response, diffusing capacity 86%.   CBC 05/19/2020-WBC 6, eos 1%, absolute eosinophil count 60 IgE 06/12/2017-9    ILD workup 06/2020 -  -CT chest -few patchy areas of peripheral predominant groundglass attenuation, septal thickening and mild subpleural reticulation most evident in the upper lobes.  Mild emphysema. ANA + 1:40 , cytoplasmic , HSP neg , AntiSleroderma ab, ANCA , SS ab, MyoMarker profile neg , Alpha 1 nml Immunoglobulins okay .   06/29/2023 Follow up : Asthma , VCD , RA-ILD  Patient returns for a 60-month follow-up.  She has severe persistent asthma complicated by vocal cord dysfunction and chronic cough.  She is prone to recurrent exacerbations.  Last hospitalization was March 2024.  She did have a asthma exacerbation in the setting of metapneumovirus and Streptococcus bronchitis.  She says she has been doing well up until the last 2 weeks.  She she started having increased cough and wheezing.  She ran out of her Symbicort 2 weeks ago.   Unclear why insurance is not covering her Symbicort.  We have contacted her pharmacy and refills have been sent.  Patient says she is also a contacted her insurance.  Patient denies any fever, discolored mucus, hemoptysis or leg swelling.  She remains on Singulair, Zyrtec, Flonase daily. She says she has not been using her ipratropium or Xopenex nebulizers on a routine basis.  Said her breathing has been doing well associated only been using them as needed.  Previously had tried on [employer] inhaler but insurance will not cover. Patient remains on the methotrexate for reported rheumatoid arthritis.  Most recent PFTs in November 2022 showed stable lung function and diffusing capacity.     Allergies  Allergen Reactions   Statins Other (See Comments)    Leg cramps   Penicillins Itching and Rash    Did it involve swelling of the face/tongue/throat, SOB, or low BP? N Did it involve sudden or severe rash/hives, skin peeling, or any reaction on the inside of your mouth or nose? Y Did you need to seek medical attention at a hospital or doctor's office? N When did it last happen? Decades Ago      If all above answers are "NO", may proceed with cephalosporin use.     Pravastatin Other (See Comments)    leg cramps    Immunization History  Administered Date(s) Administered   DTaP 03/20/2009   Fluad Quad(high Dose 65+) 09/16/2019, 08/17/2022   Influenza Split 11/10/2011, 10/10/2012, 09/17/2018   Influenza Whole 10/19/2006, 09/03/2008, 10/05/2009, 09/07/2010   Influenza, High Dose Seasonal PF 08/27/2014, 09/15/2016, 09/05/2017   Influenza,  Quadrivalent, Recombinant, Inj, Pf 08/21/2017, 09/17/2018, 09/16/2019, 08/27/2020, 09/23/2021   Influenza,inj,Quad PF,6+ Mos 09/11/2013, 08/08/2014, 09/14/2015, 09/05/2016   Influenza-Unspecified 10/05/2018   Moderna SARS-COV2 Booster Vaccination 09/29/2020   Moderna Sars-Covid-2 Vaccination 01/27/2020, 02/25/2020   PNEUMOCOCCAL CONJUGATE-20 05/09/2022    Pneumococcal Conjugate-13 11/05/2014   Pneumococcal Polysaccharide-23 03/01/2006, 12/29/2016   Td 03/20/2009   Zoster, Live 10/19/2006    Past Medical History:  Diagnosis Date   Allergic rhinitis    Allergy    seasonal allergies   Anemia    hx of   Asthma    on meds   Cataract    bilateral sx    Chronic bronchitis (HCC)    Colonic polyp    DDD (degenerative disc disease), lumbar 07/20/2011   DJD (degenerative joint disease) of knee    bilateral   DJD (degenerative joint disease), lumbar 07/20/2011   Esophageal stricture    GERD (gastroesophageal reflux disease)    on meds   Hiatal hernia    Hyperlipidemia    on meds   Osteoporosis    Stroke [facility]) 2017   "didn't know I'd had one before I was told; didn't do any damage" (11/24/2016)   Vocal cord dysfunction     Tobacco History: Social History   Tobacco Use  Smoking Status Former   Current packs/day: 0.00   Average packs/day: 0.1 packs/day for 30.0 years (3.0 ttl pk-yrs)   Types: Cigarettes   Start date: 01/31/1975   Quit date: 08/09/1995   Years since quitting: 27.9  Smokeless Tobacco Never   Counseling given: Not Answered   Outpatient Medications Prior to Visit  Medication Sig Dispense Refill   acetaminophen (TYLENOL) 500 MG tablet Take 500-1,000 mg by mouth every 6 (six) hours as needed (pain.).     albuterol (PROVENTIL HFA) 108 (90 Base) MCG/ACT inhaler Inhale 1-2 puffs into the lungs every 6 (six) hours as needed for wheezing or shortness of breath. 24 g 3   AMBULATORY NON FORMULARY MEDICATION Medication Name: oral iron once daily     aspirin EC 81 MG tablet Take 81 mg by mouth in the morning.     budesonide-formoterol (SYMBICORT) 160-4.5 MCG/ACT inhaler Inhale 2 puffs into the lungs in the morning and at bedtime. 30.6 g 3   CALCIUM-CHOLECALCIFEROL PO Take 1 tablet by mouth in the morning.     cetirizine (ZYRTEC ALLERGY) 10 MG tablet Take 1 tablet (10 mg total) by mouth daily. 30 tablet 5   cholecalciferol  (VITAMIN D3) 25 MCG (1000 UNIT) tablet Take 1,000 Units by mouth daily.     fluticasone (FLONASE) 50 MCG/ACT nasal spray Place 1 spray into the nose 2 (two) times daily.     folic acid (FOLVITE) 400 MCG tablet Take 400 mcg by mouth in the morning.     gabapentin (NEURONTIN) 100 MG capsule Take 100 mg by mouth at bedtime.     guaiFENesin (MUCINEX) 600 MG 12 hr tablet Take 2 tablets (1,200 mg total) by mouth 2 (two) times daily. 30 tablet 1   hydroxyurea (HYDREA) 500 MG capsule Take 1 capsule (500 mg total) by mouth 3 (three) times daily before meals. 90 capsule 6   ipratropium (ATROVENT HFA) 17 MCG/ACT inhaler Inhale 2 puffs into the lungs every 6 (six) hours as needed for wheezing. 1 each 12   ipratropium (ATROVENT) 0.02 % nebulizer solution Take 2.5 mLs (0.5 mg total) by nebulization every 6 (six) hours. 75 mL 12   levalbuterol (XOPENEX) 0.63 MG/3ML nebulizer solution Take 3  mLs (0.63 mg total) by nebulization every 6 (six) hours. 3 mL 12   magnesium oxide (MAG-OX) 400 MG tablet Take 400 mg by mouth in the morning.     methotrexate (RHEUMATREX) 2.5 MG tablet Take 15 mg by mouth once a week. Take on Monday     montelukast (SINGULAIR) 10 MG tablet Take 1 tablet (10 mg total) by mouth at bedtime. 90 tablet 3   Multiple Vitamins-Minerals (CENTRUM SILVER 50+WOMEN PO) Take 1 tablet by mouth in the morning.     pantoprazole (PROTONIX) 20 MG tablet Take 20 mg by mouth 2 (two) times daily.     rosuvastatin (CRESTOR) 10 MG tablet Take 10 mg by mouth at bedtime.     vitamin B-12 (CYANOCOBALAMIN) 1000 MCG tablet Take 1,000 mcg by mouth in the morning and at bedtime.     predniSONE (DELTASONE) 10 MG tablet 2 tabs for 5 days, then 1 tab for 5 days, then stop (Patient not taking: Reported on 06/29/2023) 15 tablet 0   No facility-administered medications prior to visit.     Review of Systems:   Constitutional:   No  weight loss, night sweats,  Fevers, chills, fatigue, or  lassitude.  HEENT:   No headaches,   Difficulty swallowing,  Tooth/dental problems, or  Sore throat,                No sneezing, itching, ear ache,  +nasal congestion, post nasal drip,   CV:  No chest pain,  Orthopnea, PND, swelling in lower extremities, anasarca, dizziness, palpitations, syncope.   GI  No heartburn, indigestion, abdominal pain, nausea, vomiting, diarrhea, change in bowel habits, loss of appetite, bloody stools.   Resp:   No chest wall deformity  Skin: no rash or lesions.  GU: no dysuria, change in color of urine, no urgency or frequency.  No flank pain, no hematuria   MS:  +chronic joint stiffness    Physical Exam  BP 104/66 (BP Location: Left Arm, Patient Position: Sitting, Cuff Size: Normal)   Pulse 78   Temp 98.1 F (36.7 C) (Oral)   Ht 5\' 2"  (1.575 m)   Wt 158 lb 9.6 oz (71.9 kg)   SpO2 97%   BMI 29.01 kg/m   GEN: A/Ox3; pleasant , NAD, well nourished    HEENT:  [facility]/AT,  NOSE-clear, THROAT-clear, no lesions, no postnasal drip or exudate noted.   NECK:  Supple w/ fair ROM; no JVD; normal carotid impulses w/o bruits; no thyromegaly or nodules palpated; no lymphadenopathy.    RESP  coarse BS , exp wheezing , speaks in full sentences.  No accessory muscle use, no dullness to percussion  CARD:  RRR, no m/r/g, no peripheral edema, pulses intact, no cyanosis or clubbing.  GI:   Soft & nt; nml bowel sounds; no organomegaly or masses detected.   Musco: Warm bil, no deformities or joint swelling noted.   Neuro: alert, no focal deficits noted.    Skin: Warm, no lesions or rashes    Lab Results:    BMET   BNP   Imaging: No results found.  Administration History     None          Latest Ref Rng & Units 10/14/2021   12:54 PM 04/24/2020   10:54 AM 02/03/2017    8:48 AM  PFT Results  FVC-Pre L 2.12  1.89  1.96   FVC-Predicted Pre % 108  88  89   FVC-Post L 2.18  1.76  2.44  FVC-Predicted Post % 112  82  110   Pre FEV1/FVC % % 62  50  56   Post FEV1/FCV % % 61  51  61    FEV1-Pre L 1.31  0.94  1.11   FEV1-Predicted Pre % 87  57  65   FEV1-Post L 1.34  0.90  1.48   DLCO uncorrected ml/min/mmHg 15.44  15.44  20.71   DLCO UNC% % 86  83  90   DLCO corrected ml/min/mmHg 15.44  15.44  19.42   DLCO COR %Predicted % 86  83  84   DLVA Predicted % 109  139  114   TLC L 5.24  4.67  6.30   TLC % Predicted % 110  95  128   RV % Predicted % 164  124  199     No results found for: "NITRICOXIDE"      Assessment & Plan:   Asthma Severe persistent asthma prone to recurrent flareups.  Most likely flare secondary to being off of her maintenance inhaler.  Will give a sample of Breztri.  We have contacted the pharmacy and refills have been sent.  Discussed with patient if she is unable to afford Symbicort we will need to look into patient assistance programs and/or alternatives on her formulary. Would restart nebulizer with Xopenex and ipratropium 3 times daily. Treat with empiric steroids. Check PFTs on return visit.  Plan  Patient Instructions  Prednisone taper over next week.  Sample of Breztri 2 puffs Twice daily , finish sample and then  Restart Symbicort 2 puffs Twice daily, rinse  after use.  Albuterol inhaler As needed   Restart Ipratropium and Xopenex neb Three times a day .  Delsym 2 tsp Twice daily As needed  cough .  Continue on Flonase and Zyrtec daily  Follow up with Dr. Delton Coombes in 3 months with Spirometry with DLCO  and As needed   Please contact office for sooner follow up if symptoms do not improve or worsen or seek emergency care       ILD (interstitial lung disease) (HCC) Mild interstitial changes on CT scan.  She has underlying rheumatoid arthritis.  Continue follow-up with primary care/rheumatology as she is on methotrexate. Check spirometry with DLCO on return.  If decline in lung function or change in diffusing capacity would consider repeat high-resolution CT chest     Rubye Oaks, NP 06/29/2023

## 2023-06-29 NOTE — Assessment & Plan Note (Signed)
Severe persistent asthma prone to recurrent flareups.  Most likely flare secondary to being off of her maintenance inhaler.  Will give a sample of Breztri.  We have contacted the pharmacy and refills have been sent.  Discussed with patient if she is unable to afford Symbicort we will need to look into patient assistance programs and/or alternatives on her formulary. Would restart nebulizer with Xopenex and ipratropium 3 times daily. Treat with empiric steroids. Check PFTs on return visit.  Plan  Patient Instructions  Prednisone taper over next week.  Sample of Breztri 2 puffs Twice daily , finish sample and then  Restart Symbicort 2 puffs Twice daily, rinse  after use.  Albuterol inhaler As needed   Restart Ipratropium and Xopenex neb Three times a day .  Delsym 2 tsp Twice daily As needed  cough .  Continue on Flonase and Zyrtec daily  Follow up with Dr. Delton Coombes in 3 months with Spirometry with DLCO  and As needed   Please contact office for sooner follow up if symptoms do not improve or worsen or seek emergency care

## 2023-06-29 NOTE — Assessment & Plan Note (Signed)
Mild interstitial changes on CT scan.  She has underlying rheumatoid arthritis.  Continue follow-up with primary care/rheumatology as she is on methotrexate. Check spirometry with DLCO on return.  If decline in lung function or change in diffusing capacity would consider repeat high-resolution CT chest

## 2023-06-30 DIAGNOSIS — E782 Mixed hyperlipidemia: Secondary | ICD-10-CM | POA: Diagnosis not present

## 2023-06-30 DIAGNOSIS — D473 Essential (hemorrhagic) thrombocythemia: Secondary | ICD-10-CM | POA: Diagnosis not present

## 2023-06-30 DIAGNOSIS — J849 Interstitial pulmonary disease, unspecified: Secondary | ICD-10-CM | POA: Diagnosis not present

## 2023-06-30 DIAGNOSIS — J432 Centrilobular emphysema: Secondary | ICD-10-CM | POA: Diagnosis not present

## 2023-06-30 DIAGNOSIS — I700 Atherosclerosis of aorta: Secondary | ICD-10-CM | POA: Diagnosis not present

## 2023-06-30 DIAGNOSIS — G4762 Sleep related leg cramps: Secondary | ICD-10-CM | POA: Diagnosis not present

## 2023-07-11 ENCOUNTER — Encounter: Payer: Self-pay | Admitting: Medical Oncology

## 2023-07-11 ENCOUNTER — Inpatient Hospital Stay (HOSPITAL_BASED_OUTPATIENT_CLINIC_OR_DEPARTMENT_OTHER): Payer: Medicare Other | Admitting: Medical Oncology

## 2023-07-11 ENCOUNTER — Inpatient Hospital Stay: Payer: Medicare Other | Attending: Hematology & Oncology

## 2023-07-11 ENCOUNTER — Other Ambulatory Visit: Payer: Self-pay

## 2023-07-11 VITALS — BP 140/67 | HR 86 | Temp 98.5°F | Resp 18 | Ht 62.0 in | Wt 159.0 lb

## 2023-07-11 DIAGNOSIS — D75839 Thrombocytosis, unspecified: Secondary | ICD-10-CM | POA: Diagnosis not present

## 2023-07-11 DIAGNOSIS — D473 Essential (hemorrhagic) thrombocythemia: Secondary | ICD-10-CM

## 2023-07-11 DIAGNOSIS — Z1589 Genetic susceptibility to other disease: Secondary | ICD-10-CM

## 2023-07-11 DIAGNOSIS — Z7982 Long term (current) use of aspirin: Secondary | ICD-10-CM | POA: Diagnosis not present

## 2023-07-11 DIAGNOSIS — D573 Sickle-cell trait: Secondary | ICD-10-CM | POA: Diagnosis not present

## 2023-07-11 DIAGNOSIS — Z79899 Other long term (current) drug therapy: Secondary | ICD-10-CM | POA: Insufficient documentation

## 2023-07-11 LAB — IRON AND IRON BINDING CAPACITY (CC-WL,HP ONLY)
Iron: 54 ug/dL (ref 28–170)
Saturation Ratios: 22 % (ref 10.4–31.8)
TIBC: 245 ug/dL — ABNORMAL LOW (ref 250–450)
UIBC: 191 ug/dL (ref 148–442)

## 2023-07-11 LAB — CBC WITH DIFFERENTIAL (CANCER CENTER ONLY)
Abs Immature Granulocytes: 0.13 10*3/uL — ABNORMAL HIGH (ref 0.00–0.07)
Basophils Absolute: 0.1 10*3/uL (ref 0.0–0.1)
Basophils Relative: 0 %
Eosinophils Absolute: 0.1 10*3/uL (ref 0.0–0.5)
Eosinophils Relative: 1 %
HCT: 40.5 % (ref 36.0–46.0)
Hemoglobin: 13.1 g/dL (ref 12.0–15.0)
Immature Granulocytes: 1 %
Lymphocytes Relative: 19 %
Lymphs Abs: 2.3 10*3/uL (ref 0.7–4.0)
MCH: 24.6 pg — ABNORMAL LOW (ref 26.0–34.0)
MCHC: 32.3 g/dL (ref 30.0–36.0)
MCV: 76 fL — ABNORMAL LOW (ref 80.0–100.0)
Monocytes Absolute: 0.9 10*3/uL (ref 0.1–1.0)
Monocytes Relative: 7 %
Neutro Abs: 8.9 10*3/uL — ABNORMAL HIGH (ref 1.7–7.7)
Neutrophils Relative %: 72 %
Platelet Count: 410 10*3/uL — ABNORMAL HIGH (ref 150–400)
RBC: 5.33 MIL/uL — ABNORMAL HIGH (ref 3.87–5.11)
RDW: 17.9 % — ABNORMAL HIGH (ref 11.5–15.5)
WBC Count: 12.4 10*3/uL — ABNORMAL HIGH (ref 4.0–10.5)
nRBC: 0 % (ref 0.0–0.2)

## 2023-07-11 LAB — CMP (CANCER CENTER ONLY)
ALT: 10 U/L (ref 0–44)
AST: 14 U/L — ABNORMAL LOW (ref 15–41)
Albumin: 3.7 g/dL (ref 3.5–5.0)
Alkaline Phosphatase: 67 U/L (ref 38–126)
Anion gap: 7 (ref 5–15)
BUN: 15 mg/dL (ref 8–23)
CO2: 28 mmol/L (ref 22–32)
Calcium: 9 mg/dL (ref 8.9–10.3)
Chloride: 107 mmol/L (ref 98–111)
Creatinine: 0.95 mg/dL (ref 0.44–1.00)
GFR, Estimated: >60 mL/min (ref >60)
Glucose, Bld: 84 mg/dL (ref 70–99)
Potassium: 3.8 mmol/L (ref 3.5–5.1)
Sodium: 142 mmol/L (ref 135–145)
Total Bilirubin: 0.4 mg/dL (ref 0.3–1.2)
Total Protein: 6.2 g/dL — ABNORMAL LOW (ref 6.5–8.1)

## 2023-07-11 LAB — FERRITIN: Ferritin: 73 ng/mL (ref 11–307)

## 2023-07-11 LAB — LACTATE DEHYDROGENASE: LDH: 219 U/L — ABNORMAL HIGH (ref 98–192)

## 2023-07-11 LAB — SAVE SMEAR(SSMR), FOR PROVIDER SLIDE REVIEW

## 2023-07-11 NOTE — Progress Notes (Signed)
Hematology and Oncology Follow Up Visit  [name] [id number] 08-24-1946 77 y.o. 07/11/2023   Principle Diagnosis:  Essential thrombocythemia - JAK2 POSITIVE Sickle cell trait  Current Therapy:   Hydrea 500 mg PO TID - dose changed on 05/20/2019 Folic acid 1 mg by mouth daily Aspirin 81 mg daily    Interim History:  [name] is here today for follow-up. Her last follow up was on 06/09/2023. S/p endoscopy/colonoscopy 2023- benign polyps.   At her last follow up she had been out of her hydrea for quite some time as she had loss to follow up. Her platelets were quite elevated-650 K/uL.   Today she reports that she is tolerating being back on the hydrea well. No side effects.  She is on her last dose of a steroid taper for an asthma exacerbation. Other than this she is doing well.   She has had no problems with nausea or vomiting.  She has had no problems with the sickle cell trait.  She is on the folic acid.  She does take her aspirin.  Overall, I would say that her performance status is probably ECOG 1.      Wt Readings from Last 3 Encounters:  07/11/23 159 lb (72.1 kg)  06/29/23 158 lb 9.6 oz (71.9 kg)  06/09/23 157 lb (71.2 kg)   Medications:  Allergies as of 07/11/2023       Reactions   Statins Other (See Comments)   Leg cramps   Penicillins Itching, Rash   Did it involve swelling of the face/tongue/throat, SOB, or low BP? N Did it involve sudden or severe rash/hives, skin peeling, or any reaction on the inside of your mouth or nose? Y Did you need to seek medical attention at a hospital or doctor's office? N When did it last happen? Decades Ago      If all above answers are "NO", may proceed with cephalosporin use.   Pravastatin Other (See Comments)   leg cramps        Medication List        Accurate as of July 11, 2023  9:19 AM. If you have any questions, ask your nurse or doctor.          acetaminophen 500 MG tablet Commonly known as:  TYLENOL Take 500-1,000 mg by mouth every 6 (six) hours as needed (pain.).   albuterol 108 (90 Base) MCG/ACT inhaler Commonly known as: Proventil HFA Inhale 1-2 puffs into the lungs every 6 (six) hours as needed for wheezing or shortness of breath.   AMBULATORY NON FORMULARY MEDICATION Medication Name: oral iron once daily   aspirin EC 81 MG tablet Take 81 mg by mouth in the morning.   budesonide-formoterol 160-4.5 MCG/ACT inhaler Commonly known as: Symbicort Inhale 2 puffs into the lungs in the morning and at bedtime.   CALCIUM-CHOLECALCIFEROL PO Take 1 tablet by mouth in the morning.   CENTRUM SILVER 50+WOMEN PO Take 1 tablet by mouth in the morning.   cetirizine 10 MG tablet Commonly known as: ZyrTEC Allergy Take 1 tablet (10 mg total) by mouth daily.   cholecalciferol 25 MCG (1000 UNIT) tablet Commonly known as: VITAMIN D3 Take 1,000 Units by mouth daily.   cyanocobalamin 1000 MCG tablet Commonly known as: VITAMIN B12 Take 1,000 mcg by mouth in the morning and at bedtime.   fluticasone 50 MCG/ACT nasal spray Commonly known as: FLONASE Place 1 spray into the nose 2 (two) times daily.   folic acid 400 MCG  tablet Commonly known as: FOLVITE Take 400 mcg by mouth in the morning.   gabapentin 100 MG capsule Commonly known as: NEURONTIN Take 100 mg by mouth at bedtime.   guaiFENesin 600 MG 12 hr tablet Commonly known as: MUCINEX Take 2 tablets (1,200 mg total) by mouth 2 (two) times daily.   hydroxyurea 500 MG capsule Commonly known as: HYDREA Take 1 capsule (500 mg total) by mouth 3 (three) times daily before meals.   ipratropium 0.02 % nebulizer solution Commonly known as: ATROVENT Take 2.5 mLs (0.5 mg total) by nebulization every 6 (six) hours.   ipratropium 17 MCG/ACT inhaler Commonly known as: ATROVENT HFA Inhale 2 puffs into the lungs every 6 (six) hours as needed for wheezing.   levalbuterol 0.63 MG/3ML nebulizer solution Commonly known as:  XOPENEX Take 3 mLs (0.63 mg total) by nebulization every 6 (six) hours.   magnesium oxide 400 MG tablet Commonly known as: MAG-OX Take 400 mg by mouth in the morning.   methotrexate 2.5 MG tablet Commonly known as: RHEUMATREX Take 15 mg by mouth once a week. Take on Monday   montelukast 10 MG tablet Commonly known as: SINGULAIR Take 1 tablet (10 mg total) by mouth at bedtime.   pantoprazole 20 MG tablet Commonly known as: PROTONIX Take 20 mg by mouth 2 (two) times daily.   predniSONE 10 MG tablet Commonly known as: DELTASONE 4 tabs for 2 days, then 3 tabs for 2 days, 2 tabs for 2 days, then 1 tab for 2 days, then stop   rosuvastatin 10 MG tablet Commonly known as: CRESTOR Take 10 mg by mouth at bedtime.        Allergies:  Allergies  Allergen Reactions   Statins Other (See Comments)    Leg cramps   Penicillins Itching and Rash    Did it involve swelling of the face/tongue/throat, SOB, or low BP? N Did it involve sudden or severe rash/hives, skin peeling, or any reaction on the inside of your mouth or nose? Y Did you need to seek medical attention at a hospital or doctor's office? N When did it last happen? Decades Ago      If all above answers are "NO", may proceed with cephalosporin use.     Pravastatin Other (See Comments)    leg cramps    Past Medical History, Surgical history, Social history, and Family History were reviewed and updated.  Review of Systems:   Review of Systems  Constitutional: Negative.   HENT: Negative.    Eyes: Negative.   Respiratory: Negative.    Cardiovascular: Negative.   Gastrointestinal: Negative.   Genitourinary: Negative.   Musculoskeletal: Negative.   Skin: Negative.   Neurological: Negative.   Endo/Heme/Allergies: Negative.   Psychiatric/Behavioral: Negative.       Physical Exam:  height is 5\' 2"  (1.575 m) and weight is 159 lb (72.1 kg). Her oral temperature is 98.5 F (36.9 C). Her blood pressure is 140/67  (abnormal) and her pulse is 86. Her respiration is 18 and oxygen saturation is 100%.   Wt Readings from Last 3 Encounters:  07/11/23 159 lb (72.1 kg)  06/29/23 158 lb 9.6 oz (71.9 kg)  06/09/23 157 lb (71.2 kg)   Physical Activity: Inactive (03/01/2021)   Exercise Vital Sign    Days of Exercise per Week: 0 days    Minutes of Exercise per Session: 0 min     Physical Exam Vitals reviewed.  HENT:     Head: Normocephalic and atraumatic.  Eyes:     Pupils: Pupils are equal, round, and reactive to light.  Cardiovascular:     Rate and Rhythm: Normal rate and regular rhythm.     Heart sounds: Normal heart sounds.  Pulmonary:     Effort: Pulmonary effort is normal.     Breath sounds: Wheezing (mild on auscultation) present.  Abdominal:     General: Bowel sounds are normal.     Palpations: Abdomen is soft.  Musculoskeletal:        General: No tenderness or deformity. Normal range of motion.     Cervical back: Normal range of motion.  Lymphadenopathy:     Cervical: No cervical adenopathy.  Skin:    General: Skin is warm and dry.     Findings: No erythema or rash.  Neurological:     Mental Status: She is alert and oriented to person, place, and time.  Psychiatric:        Behavior: Behavior normal.        Thought Content: Thought content normal.        Judgment: Judgment normal.      Lab Results  Component Value Date   WBC 12.4 (H) 07/11/2023   HGB 13.1 07/11/2023   HCT 40.5 07/11/2023   MCV 76.0 (L) 07/11/2023   PLT 410 (H) 07/11/2023   Lab Results  Component Value Date   FERRITIN 57 06/09/2023   IRON 53 06/09/2023   TIBC 241 (L) 06/09/2023   UIBC 188 06/09/2023   IRONPCTSAT 22 06/09/2023   Lab Results  Component Value Date   RETICCTPCT 0.8 06/20/2019   RBC 5.33 (H) 07/11/2023   RETICCTABS 73.1 08/06/2015   No results found for: "KPAFRELGTCHN", "LAMBDASER", "[facility]" Lab Results  Component Value Date   IGGSERUM 685 09/07/2020   IGA 293 09/07/2020    IGMSERUM 26 09/07/2020   No results found for: "TOTALPROTELP", "ALBUMINELP", "A1GS", "A2GS", "BETS", "BETA2SER", "GAMS", "MSPIKE", "SPEI"   Chemistry      Component Value Date/Time   NA 143 06/09/2023 0955   NA 145 11/24/2017 0754   NA 142 05/25/2017 0755   K 3.7 06/09/2023 0955   K 3.7 11/24/2017 0754   K 3.9 05/25/2017 0755   CL 106 06/09/2023 0955   CL 104 11/24/2017 0754   CO2 29 06/09/2023 0955   CO2 27 11/24/2017 0754   CO2 24 05/25/2017 0755   BUN 11 06/09/2023 0955   BUN 12 11/24/2017 0754   BUN 13.3 05/25/2017 0755   CREATININE 1.03 (H) 06/09/2023 0955   CREATININE 1.0 11/24/2017 0754   CREATININE 0.8 05/25/2017 0755      Component Value Date/Time   CALCIUM 8.8 (L) 06/09/2023 0955   CALCIUM 9.1 11/24/2017 0754   CALCIUM 9.5 05/25/2017 0755   ALKPHOS 65 06/09/2023 0955   ALKPHOS 70 11/24/2017 0754   ALKPHOS 74 05/25/2017 0755   AST 11 (L) 06/09/2023 0955   AST 21 05/25/2017 0755   ALT 8 06/09/2023 0955   ALT 18 11/24/2017 0754   ALT 14 05/25/2017 0755   BILITOT 0.5 06/09/2023 0955   BILITOT 0.37 05/25/2017 0755       Impression and Plan: [name] is a very pleasant 77 yo African American female with essential thrombocythemia, that is JAK2 positive.  In addition, she has the sickle cell trait.  Reviewed her labs which show improvement in her platelets (410 today from 650). Some changes from her current steroid use. She will continue current dose of hydrea and we will  see her in 1 month for recheck. If normal in 1 month will consider a 3 month follow up. Also adding on iron labs given her low MCV.   Disposition RTC 1 month APP, labs (CBC w/, iron, ferritin)   Rushie Chestnut, PA-C 8/6/20249:19 AM

## 2023-08-14 ENCOUNTER — Ambulatory Visit: Payer: Medicare Other | Admitting: Medical Oncology

## 2023-08-14 ENCOUNTER — Inpatient Hospital Stay: Payer: Medicare Other

## 2023-08-17 ENCOUNTER — Inpatient Hospital Stay: Payer: Medicare Other | Attending: Hematology & Oncology

## 2023-08-17 ENCOUNTER — Inpatient Hospital Stay (HOSPITAL_BASED_OUTPATIENT_CLINIC_OR_DEPARTMENT_OTHER): Payer: Medicare Other | Admitting: Medical Oncology

## 2023-08-17 ENCOUNTER — Encounter: Payer: Self-pay | Admitting: Medical Oncology

## 2023-08-17 ENCOUNTER — Other Ambulatory Visit: Payer: Self-pay

## 2023-08-17 VITALS — BP 138/67 | HR 74 | Temp 98.3°F | Resp 18 | Ht 62.0 in | Wt 157.0 lb

## 2023-08-17 DIAGNOSIS — Z1589 Genetic susceptibility to other disease: Secondary | ICD-10-CM

## 2023-08-17 DIAGNOSIS — R4586 Emotional lability: Secondary | ICD-10-CM

## 2023-08-17 DIAGNOSIS — D573 Sickle-cell trait: Secondary | ICD-10-CM | POA: Diagnosis not present

## 2023-08-17 DIAGNOSIS — D473 Essential (hemorrhagic) thrombocythemia: Secondary | ICD-10-CM | POA: Insufficient documentation

## 2023-08-17 DIAGNOSIS — F32A Depression, unspecified: Secondary | ICD-10-CM | POA: Diagnosis not present

## 2023-08-17 DIAGNOSIS — F4310 Post-traumatic stress disorder, unspecified: Secondary | ICD-10-CM | POA: Diagnosis not present

## 2023-08-17 LAB — CBC WITH DIFFERENTIAL (CANCER CENTER ONLY)
Abs Immature Granulocytes: 0.02 10*3/uL (ref 0.00–0.07)
Basophils Absolute: 0.1 10*3/uL (ref 0.0–0.1)
Basophils Relative: 1 %
Eosinophils Absolute: 0 10*3/uL (ref 0.0–0.5)
Eosinophils Relative: 1 %
HCT: 41.6 % (ref 36.0–46.0)
Hemoglobin: 13.1 g/dL (ref 12.0–15.0)
Immature Granulocytes: 0 %
Lymphocytes Relative: 31 %
Lymphs Abs: 1.6 10*3/uL (ref 0.7–4.0)
MCH: 24.3 pg — ABNORMAL LOW (ref 26.0–34.0)
MCHC: 31.5 g/dL (ref 30.0–36.0)
MCV: 77.2 fL — ABNORMAL LOW (ref 80.0–100.0)
Monocytes Absolute: 0.5 10*3/uL (ref 0.1–1.0)
Monocytes Relative: 9 %
Neutro Abs: 3 10*3/uL (ref 1.7–7.7)
Neutrophils Relative %: 58 %
Platelet Count: 478 10*3/uL — ABNORMAL HIGH (ref 150–400)
RBC: 5.39 MIL/uL — ABNORMAL HIGH (ref 3.87–5.11)
RDW: 20.3 % — ABNORMAL HIGH (ref 11.5–15.5)
WBC Count: 5.3 10*3/uL (ref 4.0–10.5)
nRBC: 0 % (ref 0.0–0.2)

## 2023-08-17 LAB — IRON AND IRON BINDING CAPACITY (CC-WL,HP ONLY)
Iron: 92 ug/dL (ref 28–170)
Saturation Ratios: 37 % — ABNORMAL HIGH (ref 10.4–31.8)
TIBC: 252 ug/dL (ref 250–450)
UIBC: 160 ug/dL (ref 148–442)

## 2023-08-17 LAB — FERRITIN: Ferritin: 94 ng/mL (ref 11–307)

## 2023-08-17 MED ORDER — SERTRALINE HCL 25 MG PO TABS
25.0000 mg | ORAL_TABLET | Freq: Every day | ORAL | 3 refills | Status: DC
Start: 2023-08-17 — End: 2023-09-21
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
## 2023-09-14 DIAGNOSIS — M0579 Rheumatoid arthritis with rheumatoid factor of multiple sites without organ or systems involvement: Secondary | ICD-10-CM | POA: Diagnosis not present

## 2023-09-14 DIAGNOSIS — M059 Rheumatoid arthritis with rheumatoid factor, unspecified: Secondary | ICD-10-CM | POA: Diagnosis not present

## 2023-09-18 ENCOUNTER — Other Ambulatory Visit (HOSPITAL_BASED_OUTPATIENT_CLINIC_OR_DEPARTMENT_OTHER): Payer: Self-pay

## 2023-09-19 ENCOUNTER — Other Ambulatory Visit (HOSPITAL_BASED_OUTPATIENT_CLINIC_OR_DEPARTMENT_OTHER): Payer: Self-pay

## 2023-09-21 ENCOUNTER — Inpatient Hospital Stay (HOSPITAL_BASED_OUTPATIENT_CLINIC_OR_DEPARTMENT_OTHER): Payer: Medicare Other | Admitting: Medical Oncology

## 2023-09-21 ENCOUNTER — Encounter: Payer: Self-pay | Admitting: Medical Oncology

## 2023-09-21 ENCOUNTER — Inpatient Hospital Stay: Payer: Medicare Other | Attending: Hematology & Oncology

## 2023-09-21 VITALS — BP 125/71 | HR 71 | Temp 98.3°F | Resp 18 | Wt 157.1 lb

## 2023-09-21 DIAGNOSIS — D473 Essential (hemorrhagic) thrombocythemia: Secondary | ICD-10-CM

## 2023-09-21 DIAGNOSIS — E639 Nutritional deficiency, unspecified: Secondary | ICD-10-CM | POA: Diagnosis not present

## 2023-09-21 DIAGNOSIS — D573 Sickle-cell trait: Secondary | ICD-10-CM | POA: Diagnosis not present

## 2023-09-21 LAB — CBC WITH DIFFERENTIAL (CANCER CENTER ONLY)
Abs Immature Granulocytes: 0.09 10*3/uL — ABNORMAL HIGH (ref 0.00–0.07)
Basophils Absolute: 0.1 10*3/uL (ref 0.0–0.1)
Basophils Relative: 1 %
Eosinophils Absolute: 0 10*3/uL (ref 0.0–0.5)
Eosinophils Relative: 1 %
HCT: 37.4 % (ref 36.0–46.0)
Hemoglobin: 12.3 g/dL (ref 12.0–15.0)
Immature Granulocytes: 1 %
Lymphocytes Relative: 34 %
Lymphs Abs: 2.8 10*3/uL (ref 0.7–4.0)
MCH: 25.3 pg — ABNORMAL LOW (ref 26.0–34.0)
MCHC: 32.9 g/dL (ref 30.0–36.0)
MCV: 76.8 fL — ABNORMAL LOW (ref 80.0–100.0)
Monocytes Absolute: 0.5 10*3/uL (ref 0.1–1.0)
Monocytes Relative: 6 %
Neutro Abs: 4.7 10*3/uL (ref 1.7–7.7)
Neutrophils Relative %: 57 %
Platelet Count: 364 10*3/uL (ref 150–400)
RBC: 4.87 MIL/uL (ref 3.87–5.11)
RDW: 21 % — ABNORMAL HIGH (ref 11.5–15.5)
WBC Count: 8.3 10*3/uL (ref 4.0–10.5)
nRBC: 0 % (ref 0.0–0.2)

## 2023-09-21 LAB — CMP (CANCER CENTER ONLY)
ALT: 9 U/L (ref 0–44)
AST: 13 U/L — ABNORMAL LOW (ref 15–41)
Albumin: 3.2 g/dL — ABNORMAL LOW (ref 3.5–5.0)
Alkaline Phosphatase: 66 U/L (ref 38–126)
Anion gap: 6 (ref 5–15)
BUN: 10 mg/dL (ref 8–23)
CO2: 28 mmol/L (ref 22–32)
Calcium: 8.3 mg/dL — ABNORMAL LOW (ref 8.9–10.3)
Chloride: 109 mmol/L (ref 98–111)
Creatinine: 0.87 mg/dL (ref 0.44–1.00)
GFR, Estimated: >60 mL/min (ref >60)
Glucose, Bld: 81 mg/dL (ref 70–99)
Potassium: 3.7 mmol/L (ref 3.5–5.1)
Sodium: 143 mmol/L (ref 135–145)
Total Bilirubin: 0.3 mg/dL (ref 0.3–1.2)
Total Protein: 5.4 g/dL — ABNORMAL LOW (ref 6.5–8.1)

## 2023-09-21 LAB — IRON AND IRON BINDING CAPACITY (CC-WL,HP ONLY)
Iron: 103 ug/dL (ref 28–170)
Saturation Ratios: 48 % — ABNORMAL HIGH (ref 10.4–31.8)
TIBC: 217 ug/dL — ABNORMAL LOW (ref 250–450)
UIBC: 114 ug/dL — ABNORMAL LOW (ref 148–442)

## 2023-09-21 LAB — FERRITIN: Ferritin: 88 ng/mL (ref 11–307)

## 2023-09-21 LAB — VITAMIN B12: Vitamin B-12: 399 pg/mL (ref 180–914)

## 2023-09-21 LAB — FOLATE: Folate: 8.6 ng/mL (ref >5.9)

## 2023-09-21 NOTE — Addendum Note (Signed)
Addended by: Clent Jacks on: 09/21/2023 09:34 AM   Modules accepted: Orders

## 2023-09-21 NOTE — Progress Notes (Addendum)
Hematology and Oncology Follow Up Visit  [name] [id number] 09/03/1946 77 y.o. 09/21/2023   Principle Diagnosis:  Essential thrombocythemia - JAK2 POSITIVE Sickle cell trait  Current Therapy:   Hydrea 500 mg PO TID - dose changed on 05/20/2019 Folic acid 1 mg by mouth daily Aspirin 81 mg daily    Interim History:  [name] is here today for follow-up. Her last follow up was on 07/11/2023. S/p endoscopy/colonoscopy 2023- benign polyps.   She is here for close monitoring of her platelets as she was out of her hydrea for a few weeks. She has now been back on it for a few weeks.   At her last visit she discussed a depressed mood which contributed to her missing medication doses. She was trailed on zoloft. She did not like it so she stopped it. Mood is a bit better. No SI/HI. She wishes to hold off on trialing a new medication at this time.   She has been taking her hydrea as prescribed. No side effects  She has had no problems with nausea or vomiting.  She has had no problems with the sickle cell trait.  There has been no bleeding to her knowledge: denies epistaxis, gingivitis, hemoptysis, hematemesis, hematuria, melena, excessive bruising, blood donation.   Overall, I would say that her performance status is probably ECOG 1.      Wt Readings from Last 3 Encounters:  09/21/23 157 lb 1.3 oz (71.3 kg)  08/17/23 157 lb (71.2 kg)  07/11/23 159 lb (72.1 kg)   Medications:  Allergies as of 09/21/2023       Reactions   Statins Other (See Comments)   Leg cramps   Penicillins Itching, Rash   Did it involve swelling of the face/tongue/throat, SOB, or low BP? N Did it involve sudden or severe rash/hives, skin peeling, or any reaction on the inside of your mouth or nose? Y Did you need to seek medical attention at a hospital or doctor's office? N When did it last happen? Decades Ago      If all above answers are "NO", may proceed with cephalosporin use.   Pravastatin Other  (See Comments)   leg cramps        Medication List        Accurate as of September 21, 2023  9:20 AM. If you have any questions, ask your nurse or doctor.          acetaminophen 500 MG tablet Commonly known as: TYLENOL Take 500-1,000 mg by mouth every 6 (six) hours as needed (pain.).   albuterol 108 (90 Base) MCG/ACT inhaler Commonly known as: Proventil HFA Inhale 1-2 puffs into the lungs every 6 (six) hours as needed for wheezing or shortness of breath.   AMBULATORY NON FORMULARY MEDICATION Medication Name: oral iron once daily   aspirin EC 81 MG tablet Take 81 mg by mouth in the morning.   budesonide-formoterol 160-4.5 MCG/ACT inhaler Commonly known as: Symbicort Inhale 2 puffs into the lungs in the morning and at bedtime.   CALCIUM-CHOLECALCIFEROL PO Take 1 tablet by mouth in the morning.   CENTRUM SILVER 50+WOMEN PO Take 1 tablet by mouth in the morning.   cetirizine 10 MG tablet Commonly known as: ZyrTEC Allergy Take 1 tablet (10 mg total) by mouth daily.   cholecalciferol 25 MCG (1000 UNIT) tablet Commonly known as: VITAMIN D3 Take 1,000 Units by mouth daily.   cyanocobalamin 1000 MCG tablet Commonly known as: VITAMIN B12 Take 1,000 mcg by  mouth in the morning and at bedtime.   fluticasone 50 MCG/ACT nasal spray Commonly known as: FLONASE Place 1 spray into the nose 2 (two) times daily.   folic acid 400 MCG tablet Commonly known as: FOLVITE Take 400 mcg by mouth in the morning.   gabapentin 100 MG capsule Commonly known as: NEURONTIN Take 100 mg by mouth at bedtime.   guaiFENesin 600 MG 12 hr tablet Commonly known as: MUCINEX Take 2 tablets (1,200 mg total) by mouth 2 (two) times daily.   hydroxyurea 500 MG capsule Commonly known as: HYDREA Take 1 capsule (500 mg total) by mouth 3 (three) times daily before meals.   ipratropium 0.02 % nebulizer solution Commonly known as: ATROVENT Take 2.5 mLs (0.5 mg total) by nebulization every 6 (six)  hours.   ipratropium 17 MCG/ACT inhaler Commonly known as: ATROVENT HFA Inhale 2 puffs into the lungs every 6 (six) hours as needed for wheezing.   levalbuterol 0.63 MG/3ML nebulizer solution Commonly known as: XOPENEX Take 3 mLs (0.63 mg total) by nebulization every 6 (six) hours.   magnesium oxide 400 MG tablet Commonly known as: MAG-OX Take 400 mg by mouth in the morning.   methotrexate 2.5 MG tablet Commonly known as: RHEUMATREX Take 15 mg by mouth once a week. Take on Monday   montelukast 10 MG tablet Commonly known as: SINGULAIR Take 1 tablet (10 mg total) by mouth at bedtime.   pantoprazole 20 MG tablet Commonly known as: PROTONIX Take 20 mg by mouth 2 (two) times daily.   rosuvastatin 10 MG tablet Commonly known as: CRESTOR Take 10 mg by mouth at bedtime.   sertraline 25 MG tablet Commonly known as: Zoloft Take 1 tablet (25 mg total) by mouth at bedtime.        Allergies:  Allergies  Allergen Reactions   Statins Other (See Comments)    Leg cramps   Penicillins Itching and Rash    Did it involve swelling of the face/tongue/throat, SOB, or low BP? N Did it involve sudden or severe rash/hives, skin peeling, or any reaction on the inside of your mouth or nose? Y Did you need to seek medical attention at a hospital or doctor's office? N When did it last happen? Decades Ago      If all above answers are "NO", may proceed with cephalosporin use.     Pravastatin Other (See Comments)    leg cramps    Past Medical History, Surgical history, Social history, and Family History were reviewed and updated.  Review of Systems:   Review of Systems  Constitutional: Negative.   HENT: Negative.    Eyes: Negative.   Respiratory: Negative.    Cardiovascular: Negative.   Gastrointestinal: Negative.   Genitourinary: Negative.   Musculoskeletal: Negative.   Skin: Negative.   Neurological: Negative.   Endo/Heme/Allergies: Negative.   Psychiatric/Behavioral:  Negative.       Physical Exam:  weight is 157 lb 1.3 oz (71.3 kg). Her oral temperature is 98.3 F (36.8 C). Her blood pressure is 125/71 and her pulse is 71. Her respiration is 18 and oxygen saturation is 100%.   Wt Readings from Last 3 Encounters:  09/21/23 157 lb 1.3 oz (71.3 kg)  08/17/23 157 lb (71.2 kg)  07/11/23 159 lb (72.1 kg)   Physical Activity: Inactive (03/01/2021)   Exercise Vital Sign    Days of Exercise per Week: 0 days    Minutes of Exercise per Session: 0 min     Physical  Exam Vitals reviewed.  HENT:     Head: Normocephalic and atraumatic.  Eyes:     Pupils: Pupils are equal, round, and reactive to light.  Cardiovascular:     Rate and Rhythm: Normal rate and regular rhythm.     Heart sounds: Normal heart sounds.  Pulmonary:     Effort: Pulmonary effort is normal.     Breath sounds: No wheezing.  Abdominal:     General: Bowel sounds are normal.     Palpations: Abdomen is soft.  Musculoskeletal:        General: No tenderness or deformity. Normal range of motion.     Cervical back: Normal range of motion.  Lymphadenopathy:     Cervical: No cervical adenopathy.  Skin:    General: Skin is warm and dry.     Findings: No erythema or rash.  Neurological:     Mental Status: She is alert and oriented to person, place, and time.  Psychiatric:        Behavior: Behavior normal.        Thought Content: Thought content normal.        Judgment: Judgment normal.      Lab Results  Component Value Date   WBC 8.3 09/21/2023   HGB 12.3 09/21/2023   HCT 37.4 09/21/2023   MCV 76.8 (L) 09/21/2023   PLT 364 09/21/2023   Lab Results  Component Value Date   FERRITIN 94 08/17/2023   IRON 92 08/17/2023   TIBC 252 08/17/2023   UIBC 160 08/17/2023   IRONPCTSAT 37 (H) 08/17/2023   Lab Results  Component Value Date   RETICCTPCT 0.8 06/20/2019   RBC 4.87 09/21/2023   RETICCTABS 73.1 08/06/2015   No results found for: "KPAFRELGTCHN", "LAMBDASER",  "[facility]" Lab Results  Component Value Date   IGGSERUM 685 09/07/2020   IGA 293 09/07/2020   IGMSERUM 26 09/07/2020   No results found for: "TOTALPROTELP", "ALBUMINELP", "A1GS", "A2GS", "BETS", "BETA2SER", "GAMS", "MSPIKE", "SPEI"   Chemistry      Component Value Date/Time   NA 143 09/21/2023 0832   NA 145 11/24/2017 0754   NA 142 05/25/2017 0755   K 3.7 09/21/2023 0832   K 3.7 11/24/2017 0754   K 3.9 05/25/2017 0755   CL 109 09/21/2023 0832   CL 104 11/24/2017 0754   CO2 28 09/21/2023 0832   CO2 27 11/24/2017 0754   CO2 24 05/25/2017 0755   BUN 10 09/21/2023 0832   BUN 12 11/24/2017 0754   BUN 13.3 05/25/2017 0755   CREATININE 0.87 09/21/2023 0832   CREATININE 1.0 11/24/2017 0754   CREATININE 0.8 05/25/2017 0755      Component Value Date/Time   CALCIUM 8.3 (L) 09/21/2023 0832   CALCIUM 9.1 11/24/2017 0754   CALCIUM 9.5 05/25/2017 0755   ALKPHOS 66 09/21/2023 0832   ALKPHOS 70 11/24/2017 0754   ALKPHOS 74 05/25/2017 0755   AST 13 (L) 09/21/2023 0832   AST 21 05/25/2017 0755   ALT 9 09/21/2023 0832   ALT 18 11/24/2017 0754   ALT 14 05/25/2017 0755   BILITOT 0.3 09/21/2023 0832   BILITOT 0.37 05/25/2017 0755       Impression and Plan: Ms. Paulman is a very pleasant 77 yo African American female with essential thrombocythemia, that is JAK2 positive.  In addition, she has the sickle cell trait.She is on a once daily 81 mg asa.   Today her platelets look good. CMP shows that she still is having some reduced  dietary intake. She will continue to work on this.   Disposition RTC 3 month APP, labs (CBC w/, iron, ferritin)-[place]   Rushie Chestnut, PA-C 10/17/20249:20 AM

## 2023-09-26 DIAGNOSIS — M25569 Pain in unspecified knee: Secondary | ICD-10-CM | POA: Diagnosis not present

## 2023-09-26 DIAGNOSIS — Z79899 Other long term (current) drug therapy: Secondary | ICD-10-CM | POA: Diagnosis not present

## 2023-09-26 DIAGNOSIS — M79671 Pain in right foot: Secondary | ICD-10-CM | POA: Diagnosis not present

## 2023-09-26 DIAGNOSIS — M79643 Pain in unspecified hand: Secondary | ICD-10-CM | POA: Diagnosis not present

## 2023-09-26 DIAGNOSIS — M25562 Pain in left knee: Secondary | ICD-10-CM | POA: Diagnosis not present

## 2023-09-26 DIAGNOSIS — M79672 Pain in left foot: Secondary | ICD-10-CM | POA: Diagnosis not present

## 2023-09-26 DIAGNOSIS — R252 Cramp and spasm: Secondary | ICD-10-CM | POA: Diagnosis not present

## 2023-09-26 DIAGNOSIS — M25511 Pain in right shoulder: Secondary | ICD-10-CM | POA: Diagnosis not present

## 2023-09-26 DIAGNOSIS — M79642 Pain in left hand: Secondary | ICD-10-CM | POA: Diagnosis not present

## 2023-09-26 DIAGNOSIS — M0579 Rheumatoid arthritis with rheumatoid factor of multiple sites without organ or systems involvement: Secondary | ICD-10-CM | POA: Diagnosis not present

## 2023-09-26 DIAGNOSIS — M1990 Unspecified osteoarthritis, unspecified site: Secondary | ICD-10-CM | POA: Diagnosis not present

## 2023-09-26 DIAGNOSIS — M25561 Pain in right knee: Secondary | ICD-10-CM | POA: Diagnosis not present

## 2023-09-26 DIAGNOSIS — M79641 Pain in right hand: Secondary | ICD-10-CM | POA: Diagnosis not present

## 2023-10-11 ENCOUNTER — Other Ambulatory Visit: Payer: Self-pay | Admitting: *Deleted

## 2023-10-11 DIAGNOSIS — J849 Interstitial pulmonary disease, unspecified: Secondary | ICD-10-CM

## 2023-10-12 ENCOUNTER — Ambulatory Visit: Payer: Medicare Other | Admitting: Emergency Medicine

## 2023-10-12 ENCOUNTER — Encounter: Payer: Self-pay | Admitting: Emergency Medicine

## 2023-10-12 ENCOUNTER — Telehealth: Payer: Self-pay | Admitting: *Deleted

## 2023-10-12 ENCOUNTER — Ambulatory Visit (INDEPENDENT_AMBULATORY_CARE_PROVIDER_SITE_OTHER): Payer: Medicare Other | Admitting: Emergency Medicine

## 2023-10-12 VITALS — BP 120/64 | HR 69 | Temp 97.9°F | Ht 62.0 in | Wt 157.0 lb

## 2023-10-12 DIAGNOSIS — J309 Allergic rhinitis, unspecified: Secondary | ICD-10-CM | POA: Diagnosis not present

## 2023-10-12 DIAGNOSIS — J383 Other diseases of vocal cords: Secondary | ICD-10-CM | POA: Diagnosis not present

## 2023-10-12 DIAGNOSIS — Z23 Encounter for immunization: Secondary | ICD-10-CM

## 2023-10-12 DIAGNOSIS — J4551 Severe persistent asthma with (acute) exacerbation: Secondary | ICD-10-CM

## 2023-10-12 DIAGNOSIS — J849 Interstitial pulmonary disease, unspecified: Secondary | ICD-10-CM | POA: Diagnosis not present

## 2023-10-12 DIAGNOSIS — K219 Gastro-esophageal reflux disease without esophagitis: Secondary | ICD-10-CM

## 2023-10-12 MED ORDER — BREZTRI AEROSPHERE 160-9-4.8 MCG/ACT IN AERO: 2.0000 | INHALATION_SPRAY | Freq: Two times a day (BID) | RESPIRATORY_TRACT | Status: DC

## 2023-10-12 NOTE — Patient Instructions (Addendum)
We will plan to continue Breztri 2 puffs twice a day for now using samples while we determine which inhaled medication is going to be most beneficial and cost effective.  This might end up being generic Symbicort (budesonide/formoterol). Keep your albuterol available to use 2 puffs if needed for shortness of breath, chest tightness, wheezing. Please take prednisone 20 mg once daily for 5 days Flu shot today Continue your Singulair 10 mg each evening Continue Zyrtec and Flonase as you have been taking them Continue pantoprazole twice a day as you have been using it Follow-up with APP in 3 months Follow Dr. Delton Coombes in 6 months or sooner if you have problems

## 2023-10-12 NOTE — Assessment & Plan Note (Signed)
More notable obstruction on her PFT compared with 2022, FEV1 68-72% predicted.  She was changed temporarily to [place] because Symbicort was not affordable with her insurance.  She liked the [place] more, felt that she got more benefit.  We will look into whether it is covered, see what the cost will be.  She may be able to stay on this or we may have to switch to an alternative such as generic Symbicort (budesonide/formoterol).  Given her upper airway irritation she will tolerate a powdered inhaler very poorly and we will try to avoid.  She has some upper airway and lower airways noise and I will treat her with 5 days of prednisone.  Plan for flu shot today

## 2023-10-12 NOTE — Progress Notes (Signed)
  Subjective:    Patient ID: [name], female    DOB: Nov 02, 1946   MRN: [id number] HPI  ROV 10/12/2023 --77 year old woman with rheumatoid arthritis and mild associated subpleural interstitial disease.  She has obstructive lung disease that consistent with moderate persistent asthma but with superimposed upper airway irritation and vocal cord dysfunction, associated chronic cough.  She is on an allergy regimen, aggressive GERD regimen to help with her upper airway irritation. Has been managed on Symbicort and Xopenex nebs as needed.  Also Zyrtec, Flonase, Singulair, pantoprazole twice daily. Today she reports that she was started on Breztri last time because she is having difficulty affording Symbicort. She liked the [place] and got more benefit. She has been having a bit more wheeze and SOB for 2 weeks, especially when she wakes up in the am. Strong voice right now. Some daily cough, non-prod.   Pulmonary function testing performed today and reviewed by me, shows moderate obstruction without a significant bronchodilator response.  FEV1 68-72% predicted.  Her FEV1 on PFT in 2022 was 87% predicted with spirometry consistent with mild obstruction, volumes that show hyperinflation   Objective:   Physical Exam Vitals:   10/12/23 1019  BP: 120/64  Pulse: 69  Temp: 97.9 F (36.6 C)  TempSrc: Oral  SpO2: 99%  Weight: 157 lb (71.2 kg)  Height: 5\' 2"  (1.575 m)    Gen: Pleasant, overwt, in no distress,  normal affect  ENT: No lesions,  mouth clear,  oropharynx clear, no postnasal drip, strong voice  Neck: supple, mild expiratory stridor  Lungs : distant, some referred upper airway noise, scattered low pitched wheezes bilaterally  Cardiovascular: RRR, heart sounds normal, no murmur or gallops, no peripheral edema  Musculoskeletal: No deformities, no cyanosis or clubbing  Neuro: alert, non focal  Skin: Warm, no rash    Assessment & Plan:   Asthma More notable obstruction on her  PFT compared with 2022, FEV1 68-72% predicted.  She was changed temporarily to [place] because Symbicort was not affordable with her insurance.  She liked the [place] more, felt that she got more benefit.  We will look into whether it is covered, see what the cost will be.  She may be able to stay on this or we may have to switch to an alternative such as generic Symbicort (budesonide/formoterol).  Given her upper airway irritation she will tolerate a powdered inhaler very poorly and we will try to avoid.  She has some upper airway and lower airways noise and I will treat her with 5 days of prednisone.  Plan for flu shot today  Allergic rhinitis Continue your Singulair 10 mg each evening Continue Zyrtec and Flonase as you have been taking them  Gastroesophageal reflux disease without esophagitis Continue pantoprazole twice daily  Vocal cord dysfunction She has significant upper airway disease in addition to her asthma.  There is some stridor today.  We will continue to work hard to treat her GERD and chronic rhinitis as contributors.  Avoid powdered inhalers.  5 days of prednisone 20 mg  ILD (interstitial lung disease) (HCC) Her most recent high-resolution CT scan of the chest was done in 2022.  I think we need to repeat in the next year.  Can discuss the timing at her next visit.     Levy Pupa, MD, PhD 10/12/2023, 10:40 AM [facility] [phone] or if no answer [phone]

## 2023-10-12 NOTE — Assessment & Plan Note (Signed)
Continue pantoprazole twice daily ?

## 2023-10-12 NOTE — Patient Instructions (Signed)
Full PFT performed today. °

## 2023-10-12 NOTE — Assessment & Plan Note (Signed)
She has significant upper airway disease in addition to her asthma.  There is some stridor today.  We will continue to work hard to treat her GERD and chronic rhinitis as contributors.  Avoid powdered inhalers.  5 days of prednisone 20 mg

## 2023-10-12 NOTE — Telephone Encounter (Signed)
She has done a trial of Breztri and she feels this works well for her.  Is this covered at all?

## 2023-10-12 NOTE — Progress Notes (Signed)
Full PFT performed today. °

## 2023-10-12 NOTE — Assessment & Plan Note (Signed)
Her most recent high-resolution CT scan of the chest was done in 2022.  I think we need to repeat in the next year.  Can discuss the timing at her next visit.

## 2023-10-12 NOTE — Addendum Note (Signed)
Addended by: Delrae Rend on: 10/12/2023 10:59 AM   Modules accepted: Orders

## 2023-10-12 NOTE — Assessment & Plan Note (Signed)
Continue your Singulair 10 mg each evening Continue Zyrtec and Flonase as you have been taking them

## 2023-10-12 NOTE — Telephone Encounter (Signed)
Patient has been on Symbicort, this is not affordable as it is >$100. She cannot tolerate a powder inhaler.  Please advise on what is on formulary.  Thank you.

## 2023-10-13 ENCOUNTER — Telehealth: Payer: Self-pay | Admitting: Emergency Medicine

## 2023-10-13 ENCOUNTER — Other Ambulatory Visit (HOSPITAL_COMMUNITY): Payer: Self-pay

## 2023-10-13 LAB — PULMONARY FUNCTION TEST
DL/VA % pred: 116 %
DL/VA: 4.83 ml/min/mmHg/L
DLCO cor % pred: 97 %
DLCO cor: 17.28 ml/min/mmHg
DLCO unc % pred: 94 %
DLCO unc: 16.67 ml/min/mmHg
FEF 25-75 Post: 0.74 L/s
FEF 25-75 Pre: 0.61 L/s
FEF2575-%Change-Post: 20 %
FEF2575-%Pred-Post: 52 %
FEF2575-%Pred-Pre: 43 %
FEV1-%Change-Post: 4 %
FEV1-%Pred-Post: 72 %
FEV1-%Pred-Pre: 68 %
FEV1-Post: 1.31 L
FEV1-Pre: 1.25 L
FEV1FVC-%Change-Post: 0 %
FEV1FVC-%Pred-Pre: 80 %
FEV6-%Change-Post: 4 %
FEV6-%Pred-Post: 94 %
FEV6-%Pred-Pre: 90 %
FEV6-Post: 2.19 L
FEV6-Pre: 2.1 L
FEV6FVC-%Change-Post: -1 %
FEV6FVC-%Pred-Post: 104 %
FEV6FVC-%Pred-Pre: 105 %
FVC-%Change-Post: 5 %
FVC-%Pred-Post: 90 %
FVC-%Pred-Pre: 85 %
FVC-Post: 2.23 L
FVC-Pre: 2.1 L
Post FEV1/FVC ratio: 59 %
Post FEV6/FVC ratio: 98 %
Pre FEV1/FVC ratio: 60 %
Pre FEV6/FVC Ratio: 100 %
RV % pred: 246 %
RV: 5.54 L
TLC % pred: 169 %
TLC: 8.05 L

## 2023-10-13 NOTE — Telephone Encounter (Signed)
Patient has office visit yesterday (November 7,2024). Prednisone was supposed to be called in but it has not reached her pharmacy yet.    Pharmacy: [facility] on [address]

## 2023-10-13 NOTE — Telephone Encounter (Signed)
[name] is showing as currently covered for a co-pay of $47.00/30 days or $141.00/90 days

## 2023-10-19 MED ORDER — PREDNISONE 20 MG PO TABS: 20.0000 mg | ORAL_TABLET | Freq: Every day | ORAL | 0 refills | Status: DC

## 2023-10-19 NOTE — Telephone Encounter (Signed)
Pt made aware of cost of Breztri Pt still has samples and does not want Breztri sent at this time. Also patient's rx was not sent for Prednisone. Rx sent for Prednisone. We will plan to continue Breztri 2 puffs twice a day for now using samples while we determine which inhaled medication is going to be most beneficial and cost effective.  This might end up being generic Symbicort (budesonide/formoterol). Keep your albuterol available to use 2 puffs if needed for shortness of breath, chest tightness, wheezing. Please take prednisone 20 mg once daily for 5 days Flu shot today Continue your Singulair 10 mg each evening Continue Zyrtec and Flonase as you have been taking them Continue pantoprazole twice a day as you have been using it Follow-up with APP in 3 months Follow Dr. Delton Coombes in 6 months or sooner if you have problems

## 2023-10-19 NOTE — Telephone Encounter (Signed)
Duplicate message Already resolved.

## 2023-10-30 ENCOUNTER — Other Ambulatory Visit (HOSPITAL_BASED_OUTPATIENT_CLINIC_OR_DEPARTMENT_OTHER): Payer: Self-pay

## 2023-11-01 DIAGNOSIS — M25569 Pain in unspecified knee: Secondary | ICD-10-CM | POA: Diagnosis not present

## 2023-11-01 DIAGNOSIS — M1990 Unspecified osteoarthritis, unspecified site: Secondary | ICD-10-CM | POA: Diagnosis not present

## 2023-11-01 DIAGNOSIS — Z79899 Other long term (current) drug therapy: Secondary | ICD-10-CM | POA: Diagnosis not present

## 2023-11-01 DIAGNOSIS — M0579 Rheumatoid arthritis with rheumatoid factor of multiple sites without organ or systems involvement: Secondary | ICD-10-CM | POA: Diagnosis not present

## 2023-11-01 DIAGNOSIS — M25511 Pain in right shoulder: Secondary | ICD-10-CM | POA: Diagnosis not present

## 2023-11-01 DIAGNOSIS — M79643 Pain in unspecified hand: Secondary | ICD-10-CM | POA: Diagnosis not present

## 2023-11-01 DIAGNOSIS — R252 Cramp and spasm: Secondary | ICD-10-CM | POA: Diagnosis not present

## 2023-11-20 DIAGNOSIS — M7052 Other bursitis of knee, left knee: Secondary | ICD-10-CM | POA: Diagnosis not present

## 2023-11-20 DIAGNOSIS — M170 Bilateral primary osteoarthritis of knee: Secondary | ICD-10-CM | POA: Diagnosis not present

## 2023-11-20 DIAGNOSIS — M7051 Other bursitis of knee, right knee: Secondary | ICD-10-CM | POA: Diagnosis not present

## 2024-01-01 DIAGNOSIS — M170 Bilateral primary osteoarthritis of knee: Secondary | ICD-10-CM | POA: Diagnosis not present

## 2024-01-12 ENCOUNTER — Ambulatory Visit: Payer: Medicare Other | Admitting: Adult Health

## 2024-01-12 ENCOUNTER — Encounter: Payer: Self-pay | Admitting: Adult Health

## 2024-01-16 DIAGNOSIS — M170 Bilateral primary osteoarthritis of knee: Secondary | ICD-10-CM | POA: Diagnosis not present

## 2024-01-19 DIAGNOSIS — D473 Essential (hemorrhagic) thrombocythemia: Secondary | ICD-10-CM | POA: Diagnosis not present

## 2024-01-19 DIAGNOSIS — J432 Centrilobular emphysema: Secondary | ICD-10-CM | POA: Diagnosis not present

## 2024-01-19 DIAGNOSIS — E782 Mixed hyperlipidemia: Secondary | ICD-10-CM | POA: Diagnosis not present

## 2024-01-19 DIAGNOSIS — J849 Interstitial pulmonary disease, unspecified: Secondary | ICD-10-CM | POA: Diagnosis not present

## 2024-01-19 DIAGNOSIS — I700 Atherosclerosis of aorta: Secondary | ICD-10-CM | POA: Diagnosis not present

## 2024-01-23 DIAGNOSIS — M170 Bilateral primary osteoarthritis of knee: Secondary | ICD-10-CM | POA: Diagnosis not present

## 2024-01-26 DIAGNOSIS — G250 Essential tremor: Secondary | ICD-10-CM | POA: Diagnosis not present

## 2024-01-26 DIAGNOSIS — D473 Essential (hemorrhagic) thrombocythemia: Secondary | ICD-10-CM | POA: Diagnosis not present

## 2024-01-26 DIAGNOSIS — G4762 Sleep related leg cramps: Secondary | ICD-10-CM | POA: Diagnosis not present

## 2024-01-26 DIAGNOSIS — M059 Rheumatoid arthritis with rheumatoid factor, unspecified: Secondary | ICD-10-CM | POA: Diagnosis not present

## 2024-01-26 DIAGNOSIS — J432 Centrilobular emphysema: Secondary | ICD-10-CM | POA: Diagnosis not present

## 2024-01-26 DIAGNOSIS — J849 Interstitial pulmonary disease, unspecified: Secondary | ICD-10-CM | POA: Diagnosis not present

## 2024-01-26 DIAGNOSIS — I700 Atherosclerosis of aorta: Secondary | ICD-10-CM | POA: Diagnosis not present

## 2024-01-26 DIAGNOSIS — N182 Chronic kidney disease, stage 2 (mild): Secondary | ICD-10-CM | POA: Diagnosis not present

## 2024-01-26 DIAGNOSIS — E782 Mixed hyperlipidemia: Secondary | ICD-10-CM | POA: Diagnosis not present

## 2024-02-05 DIAGNOSIS — M170 Bilateral primary osteoarthritis of knee: Secondary | ICD-10-CM | POA: Diagnosis not present

## 2024-02-13 ENCOUNTER — Other Ambulatory Visit (HOSPITAL_BASED_OUTPATIENT_CLINIC_OR_DEPARTMENT_OTHER): Payer: Self-pay

## 2024-02-14 ENCOUNTER — Other Ambulatory Visit (HOSPITAL_BASED_OUTPATIENT_CLINIC_OR_DEPARTMENT_OTHER): Payer: Self-pay

## 2024-03-18 DIAGNOSIS — M170 Bilateral primary osteoarthritis of knee: Secondary | ICD-10-CM | POA: Diagnosis not present

## 2024-03-18 DIAGNOSIS — M545 Low back pain, unspecified: Secondary | ICD-10-CM | POA: Diagnosis not present

## 2024-03-20 ENCOUNTER — Telehealth: Payer: Self-pay | Admitting: Emergency Medicine

## 2024-03-20 DIAGNOSIS — Z01818 Encounter for other preprocedural examination: Secondary | ICD-10-CM | POA: Diagnosis not present

## 2024-03-20 NOTE — Telephone Encounter (Signed)
 Yes thanks

## 2024-03-20 NOTE — Telephone Encounter (Signed)
 Fax received from Dr. Amadeo June with [facility] to perform a left total knee arthroplasty on patient under spinal anesthesia.  Patient needs surgery clearance. Surgery is pending. Patient was seen on 10/12/23. Office protocol is a risk assessment can be sent to surgeon if patient has been seen in 60 days or less.   Pt will need appt to clear for surgery.    Dr Baldwin Levee- could we use one of your held 4 pm slots for this pt?

## 2024-03-21 ENCOUNTER — Telehealth: Payer: Self-pay | Admitting: *Deleted

## 2024-03-21 NOTE — Telephone Encounter (Signed)
 Copied from CRM [id number]. Topic: Appointments - Scheduling Inquiry for Clinic >> Mar 21, 2024 12:54 PM Isabell A wrote: Reason for CRM: Patient returning phone call from [place] to schedule surgery clearance. Attempt to transfer to [facility] - unavailable.  Spoke with the pt and scheduled with RB for 03/27/24

## 2024-03-21 NOTE — Telephone Encounter (Signed)
 I called the pt and there was no answer- LMTCB. ?

## 2024-03-26 DIAGNOSIS — J849 Interstitial pulmonary disease, unspecified: Secondary | ICD-10-CM | POA: Diagnosis not present

## 2024-03-26 DIAGNOSIS — Z01818 Encounter for other preprocedural examination: Secondary | ICD-10-CM | POA: Diagnosis not present

## 2024-03-26 DIAGNOSIS — M059 Rheumatoid arthritis with rheumatoid factor, unspecified: Secondary | ICD-10-CM | POA: Diagnosis not present

## 2024-03-26 DIAGNOSIS — J432 Centrilobular emphysema: Secondary | ICD-10-CM | POA: Diagnosis not present

## 2024-03-26 DIAGNOSIS — N182 Chronic kidney disease, stage 2 (mild): Secondary | ICD-10-CM | POA: Diagnosis not present

## 2024-03-26 DIAGNOSIS — E782 Mixed hyperlipidemia: Secondary | ICD-10-CM | POA: Diagnosis not present

## 2024-03-26 DIAGNOSIS — D473 Essential (hemorrhagic) thrombocythemia: Secondary | ICD-10-CM | POA: Diagnosis not present

## 2024-03-27 ENCOUNTER — Telehealth: Payer: Self-pay | Admitting: *Deleted

## 2024-03-27 ENCOUNTER — Encounter: Payer: Self-pay | Admitting: Emergency Medicine

## 2024-03-27 ENCOUNTER — Ambulatory Visit: Admitting: Emergency Medicine

## 2024-03-27 VITALS — BP 113/77 | HR 83 | Ht 62.0 in | Wt 149.8 lb

## 2024-03-27 DIAGNOSIS — K219 Gastro-esophageal reflux disease without esophagitis: Secondary | ICD-10-CM

## 2024-03-27 DIAGNOSIS — Z87891 Personal history of nicotine dependence: Secondary | ICD-10-CM | POA: Diagnosis not present

## 2024-03-27 DIAGNOSIS — J309 Allergic rhinitis, unspecified: Secondary | ICD-10-CM

## 2024-03-27 DIAGNOSIS — J4551 Severe persistent asthma with (acute) exacerbation: Secondary | ICD-10-CM

## 2024-03-27 DIAGNOSIS — Z01811 Encounter for preprocedural respiratory examination: Secondary | ICD-10-CM | POA: Diagnosis not present

## 2024-03-27 DIAGNOSIS — J383 Other diseases of vocal cords: Secondary | ICD-10-CM

## 2024-03-27 MED ORDER — BREZTRI AEROSPHERE 160-9-4.8 MCG/ACT IN AERO: INHALATION_SPRAY | RESPIRATORY_TRACT | Status: AC

## 2024-03-27 NOTE — Assessment & Plan Note (Signed)
 She is being evaluated for knee surgery for severe osteoarthritis.  Her age and history of asthma put her at moderate to high risk for general anesthesia but do not preclude her from proceeding.  She is well compensated at this time.  Certainly should be mindful of the potential for upper airway irritation from endotracheal intubation for general anesthesia in her postoperative care.  We reviewed your medications and your breathing status today.  Your asthma and chronic throat irritation put you at moderately increased risk for surgery and general anesthesia.  There is no contraindication to having your surgery if the benefits outweigh these risks.  You should be maintained on your usual medications for allergies, reflux and asthma when hospitalized.  Attention should be given to possible throat and upper airway irritation that can come from mechanical ventilation that goes along with general anesthesia.

## 2024-03-27 NOTE — Assessment & Plan Note (Signed)
 Significant contributor to her previously debilitating symptoms.  Controlling her GERD and rhinitis have helped significantly.  No flares recently.

## 2024-03-27 NOTE — Progress Notes (Signed)
 Subjective:    Patient ID: [name], female    DOB: [birth year]   MRN: [id number] HPI  ROV 10/12/2023 --78 year old woman with rheumatoid arthritis and mild associated subpleural interstitial disease.  She has obstructive lung disease that consistent with moderate persistent asthma but with superimposed upper airway irritation and vocal cord dysfunction, associated chronic cough.  She is on an allergy regimen, aggressive GERD regimen to help with her upper airway irritation. Has been managed on Symbicort  and Xopenex  nebs as needed.  Also Zyrtec , Flonase , Singulair , pantoprazole  twice daily. Today she reports that she was started on Breztri  last time because she is having difficulty affording Symbicort . She liked the Breztri  and got more benefit. She has been having a bit more wheeze and SOB for 2 weeks, especially when she wakes up in the am. Strong voice right now. Some daily cough, non-prod.   Pulmonary function testing performed today and reviewed by me, shows moderate obstruction without a significant bronchodilator response.  FEV1 68-72% predicted.  Her FEV1 on PFT in 2022 was 87% predicted with spirometry consistent with mild obstruction, volumes that show hyperinflation  ROV 03/27/24 --pleasant 78 year old woman with a history of moderate persistent asthma and chronic upper airway irritation with vocal cord dysfunction, chronic cough.  Also with rheumatoid arthritis and some mild associated subpleural interstitial lung disease.  She is on an aggressive allergy regimen, GERD regimen for her upper airway irritation.  She is here today for preoperative evaluation in preparation for planned knee surgery. Dr Carlye Child planning  She reports today that she has been doing well, especially since she started Breztri , only using albuterol  once a day, usually in the morning. No big flares, no ER visits or prednisone . She occasionally has some smaller flares of throat irritation. Her GERD is well  controlled. She is able to walk.    Objective:   Physical Exam Vitals:   03/27/24 1549  BP: 113/77  Pulse: 83  SpO2: 100%  Weight: 149 lb 12.8 oz (67.9 kg)  Height: 5\' 2"  (1.575 m)    Gen: Pleasant, overwt, in no distress,  normal affect  ENT: No lesions,  mouth clear,  oropharynx clear, no postnasal drip, strong voice  Neck: supple, mild expiratory stridor  Lungs : distant, some referred upper airway noise, scattered low pitched wheezes bilaterally  Cardiovascular: RRR, heart sounds normal, no murmur or gallops, no peripheral edema  Musculoskeletal: No deformities, no cyanosis or clubbing  Neuro: alert, non focal  Skin: Warm, no rash    Assessment & Plan:   Asthma  Please continue your Breztri  2 puffs twice a day.  Rinse and gargle after using. Continue to keep albuterol  available use either 2 puffs or 1 nebulizer treatment as you needed for shortness of breath, chest tightness, wheezing. Follow with Dr. Baldwin Levee in 6 months, sooner if you have any problems.  Allergic rhinitis Continue same regimen  Gastroesophageal reflux disease without esophagitis Much improved on pantoprazole  twice daily.  Plan to continue  Vocal cord dysfunction Significant contributor to her previously debilitating symptoms.  Controlling her GERD and rhinitis have helped significantly.  No flares recently.  Preoperative respiratory examination She is being evaluated for knee surgery for severe osteoarthritis.  Her age and history of asthma put her at moderate to high risk for general anesthesia but do not preclude her from proceeding.  She is well compensated at this time.  Certainly should be mindful of the potential for upper airway irritation from endotracheal intubation for general anesthesia in  her postoperative care.  We reviewed your medications and your breathing status today.  Your asthma and chronic throat irritation put you at moderately increased risk for surgery and general  anesthesia.  There is no contraindication to having your surgery if the benefits outweigh these risks.  You should be maintained on your usual medications for allergies, reflux and asthma when hospitalized.  Attention should be given to possible throat and upper airway irritation that can come from mechanical ventilation that goes along with general anesthesia.      Racheal Buddle, MD, PhD 03/27/2024, 4:30 PM [facility] [phone] or if no answer [phone]

## 2024-03-27 NOTE — Assessment & Plan Note (Signed)
 Continue same regimen

## 2024-03-27 NOTE — Telephone Encounter (Signed)
   Pre-operative Risk Assessment    Patient Name: [name]  DOB: 29-Oct-1946 MRN: [id number]   Date of last office visit: 08/16/17 Date of next office visit: not scheduled   Request for Surgical Clearance    Procedure:   left total knee arthroplasty  Date of Surgery:  Clearance TBD                                Surgeon:  Dr Charol Copas Surgeon's Group or Practice Name:  [facility] Phone number:  [phone] Fax number:  [phone]   Type of Clearance Requested:   - Medical  - Pharmacy:  Hold Aspirin      Type of Anesthesia:  Spinal   Additional requests/questions:    SignedClive Dancer   03/27/2024, 5:11 PM

## 2024-03-27 NOTE — Assessment & Plan Note (Signed)
 Much improved on pantoprazole  twice daily.  Plan to continue

## 2024-03-27 NOTE — Patient Instructions (Addendum)
 We reviewed your medications and your breathing status today.  Your asthma and chronic throat irritation put you at moderately increased risk for surgery and general anesthesia.  There is no contraindication to having your surgery if the benefits outweigh these risks.  You should be maintained on your usual medications for allergies, reflux and asthma when hospitalized.  Attention should be given to possible throat and upper airway irritation that can come from mechanical ventilation that goes along with general anesthesia.  Please continue your Breztri  2 puffs twice a day.  Rinse and gargle after using. Continue to keep albuterol  available use either 2 puffs or 1 nebulizer treatment as you needed for shortness of breath, chest tightness, wheezing. Continue your Singulair , Zyrtec , nasal sprays as you have been using them Please continue your pantoprazole  twice a day Follow with Dr. Baldwin Levee in 6 months, sooner if you have any problems.

## 2024-03-27 NOTE — Assessment & Plan Note (Signed)
  Please continue your Breztri  2 puffs twice a day.  Rinse and gargle after using. Continue to keep albuterol  available use either 2 puffs or 1 nebulizer treatment as you needed for shortness of breath, chest tightness, wheezing. Follow with Dr. Baldwin Levee in 6 months, sooner if you have any problems.

## 2024-03-28 ENCOUNTER — Other Ambulatory Visit (HOSPITAL_BASED_OUTPATIENT_CLINIC_OR_DEPARTMENT_OTHER): Payer: Self-pay

## 2024-03-28 NOTE — Telephone Encounter (Signed)
    Primary Cardiologist:None  Chart reviewed as part of pre-operative protocol coverage. Because of [name]'s past medical history and time since last visit, he/she will require a follow-up visit in order to better assess preoperative cardiovascular risk.  Pre-op covering staff: - Please schedule in office appointment and call patient to inform them. - Please contact requesting surgeon's office via preferred method (i.e, phone, fax) to inform them of need for appointment prior to surgery.  If applicable, this message will also be routed to pharmacy pool and/or primary cardiologist for input on holding anticoagulant/antiplatelet agent as requested below so that this information is available at time of patient's appointment.   Carie Charity, NP  03/28/2024, 7:01 AM

## 2024-03-28 NOTE — Telephone Encounter (Signed)
 Pt not seen since 2018, however, will require a new pt.  Will send back to [facility] to make them aware that pt will need a new pt referral sent over in order to get pt scheduled for surgical clearance.

## 2024-04-03 NOTE — Telephone Encounter (Signed)
 I will send notes to surgeon office we will need referral for new pt appt for preop clearance. I see notes were sent last week to surgeon office about new pt appt needed.   I do not see that we have received a referral for new pt appt for preop clearance.   I will also reach out to our scheduling team to please help with new pt appt for preop clearance.

## 2024-04-03 NOTE — Telephone Encounter (Signed)
 I sent a secure chat to our scheduling team about new pt appt. I was informed no new pt appt's until 07/2024. I will send a message to the surgeon's office as FYI.

## 2024-04-09 NOTE — Telephone Encounter (Signed)
 Patient scheduled for New Patient appointment to see A. Lorie Rook, MD on 04/10/24 @ 10:20 am.

## 2024-04-09 NOTE — Progress Notes (Unsigned)
 Cardiology Office Note:   Date:  04/10/2024  ID:  [name], DOB Jan 19, 1946, MRN [id number] PCP:  Virgle Grime, MD  [facility] Providers Cardiologist:  Alyssa Backbone, MD Referring MD: Virgle Grime, MD  Chief Complaint/Reason for Referral: Preoperative assessment left total knee arthroplasty ASSESSMENT:    1. Preoperative cardiovascular examination   2. Rheumatoid arthritis, involving unspecified site, unspecified whether rheumatoid factor present (HCC)   3. ILD (interstitial lung disease) (HCC)   4. Hyperlipidemia LDL goal <70   5. Aortic atherosclerosis (HCC)   6. Essential thrombocythemia (HCC)     PLAN:   In order of problems listed above: Preoperative evaluation: Will obtain Myoview  stress test and echocardiogram.  Only if high risk findings are present for further evaluation be necessary. Rheumatoid arthritis: Continue methotrexate  15 mg q. weekly. Interstitial lung disease: Followed by pulmonology; continue prednisone  20 mg daily, albuterol  as needed, Atrovent  as needed, Singulair  10 mg daily. Hyperlipidemia: Continue rosuvastatin  10 mg. Aortic atherosclerosis: Continue aspirin  81 mg and rosuvastatin  10 mg. Essential thrombocythemia: Continue hydroxyurea  500 mg 3 times daily, folic acid  100 mg daily, and aspirin  81 mg.        Informed Consent   Shared Decision Making/Informed Consent The risks [chest pain, shortness of breath, cardiac arrhythmias, dizziness, blood pressure fluctuations, myocardial infarction, stroke/transient ischemic attack, nausea, vomiting, allergic reaction, radiation exposure, metallic taste sensation and life-threatening complications (estimated to be 1 in 10,000)], benefits (risk stratification, diagnosing coronary artery disease, treatment guidance) and alternatives of a nuclear stress test were discussed in detail with [name] and she agrees to proceed.      Dispo:  PRN      Medication Adjustments/Labs and Tests  Ordered: Current medicines are reviewed at length with the patient today.  Concerns regarding medicines are outlined above.  The following changes have been made:    Labs/tests ordered: Orders Placed This Encounter  Procedures   MYOCARDIAL PERFUSION IMAGING   EKG 12-Lead   ECHOCARDIOGRAM COMPLETE    Medication Changes: No orders of the defined types were placed in this encounter.   Current medicines are reviewed at length with the patient today.  The patient does not have concerns regarding medicines.     History of Present Illness:      FOCUSED PROBLEM LIST:   Prediabetes Hemoglobin A1c 5.8 2022 Rheumatoid arthritis On methotrexate  With interstitial lung disease >> followed by pulmonary Asthma Hyperlipidemia Aortic atherosclerosis Chest CT 2022 Essential thrombocythemia On hydroxyurea , folic acid , and aspirin  Followed by oncology BMI 30 Apr 2024:  Patient consents to use of AI scribe. Patient is a 78 year old female with above listed medical problems referred for cardiac evaluation prior to noncardiac surgery.  The patient is scheduled for left knee surgery with orthopedic surgery.  She experiences significant knee pain due to osteoarthritis, described as 'bone to bone,' which makes it difficult to navigate stairs, especially descending. She has four steps at home that are challenging to manage, requiring her to hold onto the rail for support. Despite these difficulties, she can walk 'pretty good.'  No chest pain while walking, but she experiences occasional chest pain when lying in bed or sitting, which sometimes wakes her up. She attributes this to possibly being related to something she ate. No episodes of syncope are reported.  She quit smoking years ago after being diagnosed with asthma and did not smoke heavily prior to quitting. 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        Current  Medications: Current Meds  Medication Sig   acetaminophen  (TYLENOL ) 500 MG tablet Take 500-1,000 mg by mouth every 6 (six) hours as needed (pain.).   albuterol  (PROVENTIL  HFA) 108 (90 Base) MCG/ACT inhaler Inhale 1-2 puffs into the lungs every 6 (six) hours as needed for wheezing or shortness of breath.   AMBULATORY NON FORMULARY MEDICATION Medication Name: oral iron once daily   aspirin  EC 81 MG tablet Take 81 mg by mouth in the morning.   Budeson-Glycopyrrol-Formoterol  (BREZTRI  AEROSPHERE) 160-9-4.8 MCG/ACT AERO Inhale 2 puffs into the lungs in the morning and at bedtime.   budeson-glycopyrrolate-formoterol  (BREZTRI  AEROSPHERE) 160-9-4.8 MCG/ACT AERO inhaler 1 sample provided   CALCIUM -CHOLECALCIFEROL  PO Take 1 tablet by mouth in the morning.   cetirizine  (ZYRTEC  ALLERGY) 10 MG tablet Take 1 tablet (10 mg total) by mouth daily.   cholecalciferol  (VITAMIN D3) 25 MCG (1000 UNIT) tablet Take 1,000 Units by mouth daily.   fluticasone  (FLONASE ) 50 MCG/ACT nasal spray Place 1 spray into the nose 2 (two) times daily.   folic acid  (FOLVITE ) 400 MCG tablet Take 400 mcg by mouth in the morning.   gabapentin  (NEURONTIN ) 100 MG capsule Take 100 mg by mouth at bedtime.   hydroxyurea  (HYDREA ) 500 MG capsule Take 1 capsule (500 mg total) by mouth 3 (three) times daily before meals.   ipratropium (ATROVENT  HFA) 17 MCG/ACT inhaler Inhale 2 puffs into the lungs every 6 (six) hours as needed for wheezing.   ipratropium (ATROVENT ) 0.02 % nebulizer solution Take 2.5 mLs (0.5 mg total) by nebulization every 6 (six) hours.   levalbuterol  (XOPENEX ) 0.63 MG/3ML nebulizer solution Take 3 mLs (0.63 mg total) by nebulization every 6 (six) hours.   magnesium  oxide (MAG-OX) 400 MG tablet Take 400 mg by mouth in the morning.   methotrexate  (RHEUMATREX) 2.5 MG tablet Take 15 mg by mouth once a week. Take on Monday   montelukast  (SINGULAIR ) 10 MG tablet Take 1 tablet (10 mg total) by mouth at bedtime.   Multiple  Vitamins-Minerals (CENTRUM SILVER 50+WOMEN PO) Take 1 tablet by mouth in the morning.   pantoprazole  (PROTONIX ) 20 MG tablet Take 20 mg by mouth 2 (two) times daily.   rosuvastatin  (CRESTOR ) 10 MG tablet Take 10 mg by mouth at bedtime.   vitamin B-12 (CYANOCOBALAMIN ) 1000 MCG tablet Take 1,000 mcg by mouth in the morning and at bedtime.     Review of Systems:   Please see the history of present illness.    All other systems reviewed and are negative.     EKGs/Labs/Other Test Reviewed:   EKG: EKG from 2023 demonstrates sinus rhythm left atrial enlargement  EKG Interpretation Date/Time:  Wednesday Apr 10 2024 10:13:54 EDT Ventricular Rate:  66 PR Interval:  194 QRS Duration:  74 QT Interval:  436 QTC Calculation: 457 R Axis:   13  Text Interpretation: Normal sinus rhythm with sinus arrhythmia Normal ECG When compared with ECG of 06-Feb-2023 17:05, PREVIOUS ECG IS PRESENT Confirmed by Alyssa Backbone ([id number]) on 04/10/2024 10:24:49 AM         Risk Assessment/Calculations:          Physical Exam:   VS:  BP 120/64   Pulse 66   Ht 5\' 2"  (1.575 m)   Wt 146 lb 3.2 oz (66.3 kg)   SpO2 99%   BMI 26.74 kg/m        Wt Readings from Last 3 Encounters:  04/10/24 146 lb 3.2  oz (66.3 kg)  03/27/24 149 lb 12.8 oz (67.9 kg)  10/12/23 157 lb (71.2 kg)      GENERAL:  No apparent distress, AOx3 HEENT:  No carotid bruits, +2 carotid impulses, no scleral icterus CAR: RRR no murmurs, gallops, rubs, or thrills RES:  Clear to auscultation bilaterally ABD:  Soft, nontender, nondistended, positive bowel sounds x 4 VASC:  +2 radial pulses, +2 carotid pulses NEURO:  CN 2-12 grossly intact; motor and sensory grossly intact PSYCH:  No active depression or anxiety EXT:  No edema, ecchymosis, or cyanosis  Signed, Neev Mcmains K Christiane Sistare, MD  04/10/2024 10:33 AM    [facility] [address] [place], [place], [place]  [address] Phone: [phone]; Fax: [phone]   Note:  This  document was prepared using Dragon voice recognition software and may include unintentional dictation errors.

## 2024-04-10 ENCOUNTER — Encounter: Payer: Self-pay | Admitting: Internal Medicine

## 2024-04-10 ENCOUNTER — Ambulatory Visit: Attending: Internal Medicine | Admitting: Internal Medicine

## 2024-04-10 VITALS — BP 120/64 | HR 66 | Ht 62.0 in | Wt 146.2 lb

## 2024-04-10 DIAGNOSIS — D473 Essential (hemorrhagic) thrombocythemia: Secondary | ICD-10-CM

## 2024-04-10 DIAGNOSIS — I700 Atherosclerosis of aorta: Secondary | ICD-10-CM | POA: Diagnosis not present

## 2024-04-10 DIAGNOSIS — M069 Rheumatoid arthritis, unspecified: Secondary | ICD-10-CM | POA: Diagnosis not present

## 2024-04-10 DIAGNOSIS — Z01810 Encounter for preprocedural cardiovascular examination: Secondary | ICD-10-CM

## 2024-04-10 DIAGNOSIS — E785 Hyperlipidemia, unspecified: Secondary | ICD-10-CM

## 2024-04-10 DIAGNOSIS — J849 Interstitial pulmonary disease, unspecified: Secondary | ICD-10-CM | POA: Diagnosis not present

## 2024-04-10 NOTE — Patient Instructions (Signed)
 Medication Instructions:  Your physician recommends that you continue on your current medications as directed. Please refer to the Current Medication list given to you today.  *If you need a refill on your cardiac medications before your next appointment, please call your pharmacy*  Lab Work: NONE If you have labs (blood work) drawn today and your tests are completely normal, you will receive your results only by: MyChart Message (if you have MyChart) OR A paper copy in the mail If you have any lab test that is abnormal or we need to change your treatment, we will call you to review the results.  Testing/Procedures: Your physician has requested that you have a lexiscan  myoview . For further information please visit [email or web address] Please follow instruction sheet, as given.  Your physician has requested that you have an echocardiogram. Echocardiography is a painless test that uses sound waves to create images of your heart. It provides your doctor with information about the size and shape of your heart and how well your heart's chambers and valves are working. This procedure takes approximately one hour. There are no restrictions for this procedure. Please do NOT wear cologne, perfume, aftershave, or lotions (deodorant is allowed). Please arrive 15 minutes prior to your appointment time.  Please note: We ask at that you not bring children with you during ultrasound (echo/ vascular) testing. Due to room size and safety concerns, children are not allowed in the ultrasound rooms during exams. Our front office staff cannot provide observation of children in our lobby area while testing is being conducted. An adult accompanying a patient to their appointment will only be allowed in the ultrasound room at the discretion of the ultrasound technician under special circumstances. We apologize for any inconvenience.    Follow-Up: At [facility], you and your health needs are our priority.  As  part of our continuing mission to provide you with exceptional heart care, our providers are all part of one team.  This team includes your primary Cardiologist (physician) and Advanced Practice Providers or APPs (Physician Assistants and Nurse Practitioners) who all work together to provide you with the care you need, when you need it.  Your next appointment:   AS NEEDED  We recommend signing up for the patient portal called "MyChart".  Sign up information is provided on this After Visit Summary.  MyChart is used to connect with patients for Virtual Visits (Telemedicine).  Patients are able to view lab/test results, encounter notes, upcoming appointments, etc.  Non-urgent messages can be sent to your provider as well.   To learn more about what you can do with MyChart, go to [email or web address].   Other Instructions

## 2024-04-19 NOTE — Telephone Encounter (Signed)
 03/27/24 ov note with [facility] faxed to Dr Carlye Child.

## 2024-04-26 NOTE — Telephone Encounter (Signed)
 03/27/24 ov note with Dr Baldwin Levee faxed to

## 2024-05-07 ENCOUNTER — Telehealth (HOSPITAL_COMMUNITY): Payer: Self-pay | Admitting: *Deleted

## 2024-05-07 NOTE — Telephone Encounter (Signed)
 Left message on voicemail in reference to upcoming appointment scheduled for 05/15/24 Phone number given for a call back so details instructions can be given.  Argentina Bees

## 2024-05-10 ENCOUNTER — Other Ambulatory Visit: Payer: Self-pay | Admitting: Internal Medicine

## 2024-05-10 DIAGNOSIS — Z01810 Encounter for preprocedural cardiovascular examination: Secondary | ICD-10-CM

## 2024-05-14 ENCOUNTER — Telehealth (HOSPITAL_COMMUNITY): Payer: Self-pay | Admitting: *Deleted

## 2024-05-14 NOTE — Telephone Encounter (Signed)
 Patient given detailed instructions per Myocardial Perfusion Study Information Sheet for the test on 96/10/25  Patient notified to arrive 15 minutes early and that it is imperative to arrive on time for appointment to keep from having the test rescheduled.  If you need to cancel or reschedule your appointment, please call the office within 24 hours of your appointment. . Patient verbalized understanding. [name]

## 2024-05-15 ENCOUNTER — Other Ambulatory Visit: Payer: Self-pay | Admitting: Hematology & Oncology

## 2024-05-15 ENCOUNTER — Ambulatory Visit (HOSPITAL_COMMUNITY)
Admission: RE | Admit: 2024-05-15 | Discharge: 2024-05-15 | Disposition: A | Discharge status: Home / Self Care | Source: Ambulatory Visit | Attending: Internal Medicine | Admitting: Internal Medicine

## 2024-05-15 ENCOUNTER — Other Ambulatory Visit (HOSPITAL_BASED_OUTPATIENT_CLINIC_OR_DEPARTMENT_OTHER): Payer: Self-pay

## 2024-05-15 ENCOUNTER — Ambulatory Visit (HOSPITAL_COMMUNITY)
Admission: RE | Admit: 2024-05-15 | Discharge: 2024-05-15 | Disposition: A | Discharge status: Home / Self Care | Source: Ambulatory Visit | Attending: Cardiology | Admitting: Cardiology

## 2024-05-15 ENCOUNTER — Ambulatory Visit: Payer: Self-pay | Admitting: Internal Medicine

## 2024-05-15 DIAGNOSIS — Z01810 Encounter for preprocedural cardiovascular examination: Secondary | ICD-10-CM | POA: Diagnosis not present

## 2024-05-15 DIAGNOSIS — D473 Essential (hemorrhagic) thrombocythemia: Secondary | ICD-10-CM

## 2024-05-15 DIAGNOSIS — Z1589 Genetic susceptibility to other disease: Secondary | ICD-10-CM

## 2024-05-15 DIAGNOSIS — D573 Sickle-cell trait: Secondary | ICD-10-CM

## 2024-05-15 LAB — MYOCARDIAL PERFUSION IMAGING
Base ST Depression (mm): 0 mm
LV dias vol: 67 mL (ref 46–106)
LV sys vol: 14 mL
Nuc Stress EF: 79 %
Peak HR: 100 {beats}/min
Rest HR: 70 {beats}/min
Rest Nuclear Isotope Dose: 10.4 mCi
SDS: 0
SRS: 0
SSS: 0
ST Depression (mm): 0 mm
Stress Nuclear Isotope Dose: 31 mCi

## 2024-05-15 LAB — ECHOCARDIOGRAM COMPLETE
Area-P 1/2: 6.17 cm2
S' Lateral: 2.4 cm

## 2024-05-15 MED ORDER — REGADENOSON 0.4 MG/5ML IV SOLN
0.4000 mg | Freq: Once | INTRAVENOUS | Status: AC
  Administered 2024-05-15: 0.4 mg via INTRAVENOUS

## 2024-05-15 MED ORDER — REGADENOSON 0.4 MG/5ML IV SOLN
INTRAVENOUS | Status: AC
  Filled 2024-05-15: qty 5

## 2024-05-15 MED ORDER — HYDROXYUREA 500 MG PO CAPS
500.0000 mg | ORAL_CAPSULE | Freq: Three times a day (TID) | ORAL | 6 refills | Status: AC
  Filled 2024-05-15: qty 90, 30d supply, fill #0
  Filled 2024-06-28: qty 90, 30d supply, fill #1
  Filled 2024-09-02: qty 90, 30d supply, fill #2
  Filled 2024-11-12: qty 90, 30d supply, fill #3

## 2024-05-15 MED ORDER — TECHNETIUM TC 99M TETROFOSMIN IV KIT
10.4000 | PACK | Freq: Once | INTRAVENOUS | Status: AC | PRN
  Administered 2024-05-15: 10.4 via INTRAVENOUS

## 2024-05-15 MED ORDER — TECHNETIUM TC 99M TETROFOSMIN IV KIT
31.0000 | PACK | Freq: Once | INTRAVENOUS | Status: AC | PRN
  Administered 2024-05-15: 31 via INTRAVENOUS

## 2024-05-17 ENCOUNTER — Other Ambulatory Visit (HOSPITAL_BASED_OUTPATIENT_CLINIC_OR_DEPARTMENT_OTHER): Payer: Self-pay

## 2024-05-20 ENCOUNTER — Other Ambulatory Visit: Payer: Self-pay | Admitting: Medical Oncology

## 2024-05-20 DIAGNOSIS — E639 Nutritional deficiency, unspecified: Secondary | ICD-10-CM

## 2024-05-20 DIAGNOSIS — D473 Essential (hemorrhagic) thrombocythemia: Secondary | ICD-10-CM

## 2024-05-23 ENCOUNTER — Other Ambulatory Visit: Payer: Self-pay

## 2024-05-23 ENCOUNTER — Inpatient Hospital Stay: Attending: Hematology & Oncology

## 2024-05-23 ENCOUNTER — Inpatient Hospital Stay (HOSPITAL_BASED_OUTPATIENT_CLINIC_OR_DEPARTMENT_OTHER): Admitting: Medical Oncology

## 2024-05-23 ENCOUNTER — Encounter: Payer: Self-pay | Admitting: Medical Oncology

## 2024-05-23 ENCOUNTER — Other Ambulatory Visit (HOSPITAL_BASED_OUTPATIENT_CLINIC_OR_DEPARTMENT_OTHER): Payer: Self-pay

## 2024-05-23 VITALS — BP 111/60 | HR 73 | Temp 98.3°F | Resp 18 | Ht 62.0 in | Wt 141.4 lb

## 2024-05-23 DIAGNOSIS — R4589 Other symptoms and signs involving emotional state: Secondary | ICD-10-CM | POA: Diagnosis not present

## 2024-05-23 DIAGNOSIS — E538 Deficiency of other specified B group vitamins: Secondary | ICD-10-CM

## 2024-05-23 DIAGNOSIS — R5383 Other fatigue: Secondary | ICD-10-CM | POA: Diagnosis not present

## 2024-05-23 DIAGNOSIS — E639 Nutritional deficiency, unspecified: Secondary | ICD-10-CM | POA: Diagnosis not present

## 2024-05-23 DIAGNOSIS — R634 Abnormal weight loss: Secondary | ICD-10-CM

## 2024-05-23 DIAGNOSIS — Z1589 Genetic susceptibility to other disease: Secondary | ICD-10-CM

## 2024-05-23 DIAGNOSIS — D473 Essential (hemorrhagic) thrombocythemia: Secondary | ICD-10-CM | POA: Insufficient documentation

## 2024-05-23 DIAGNOSIS — D509 Iron deficiency anemia, unspecified: Secondary | ICD-10-CM | POA: Diagnosis not present

## 2024-05-23 DIAGNOSIS — R935 Abnormal findings on diagnostic imaging of other abdominal regions, including retroperitoneum: Secondary | ICD-10-CM | POA: Diagnosis not present

## 2024-05-23 DIAGNOSIS — Z7982 Long term (current) use of aspirin: Secondary | ICD-10-CM | POA: Diagnosis not present

## 2024-05-23 DIAGNOSIS — D573 Sickle-cell trait: Secondary | ICD-10-CM | POA: Insufficient documentation

## 2024-05-23 DIAGNOSIS — Z79620 Long term (current) use of immunosuppressive biologic: Secondary | ICD-10-CM | POA: Insufficient documentation

## 2024-05-23 LAB — CBC WITH DIFFERENTIAL (CANCER CENTER ONLY)
Abs Immature Granulocytes: 0.03 10*3/uL (ref 0.00–0.07)
Basophils Absolute: 0 10*3/uL (ref 0.0–0.1)
Basophils Relative: 1 %
Eosinophils Absolute: 0 10*3/uL (ref 0.0–0.5)
Eosinophils Relative: 0 %
HCT: 30.7 % — ABNORMAL LOW (ref 36.0–46.0)
Hemoglobin: 9.7 g/dL — ABNORMAL LOW (ref 12.0–15.0)
Immature Granulocytes: 1 %
Lymphocytes Relative: 31 %
Lymphs Abs: 1.3 10*3/uL (ref 0.7–4.0)
MCH: 28.4 pg (ref 26.0–34.0)
MCHC: 31.6 g/dL (ref 30.0–36.0)
MCV: 89.8 fL (ref 80.0–100.0)
Monocytes Absolute: 0.2 10*3/uL (ref 0.1–1.0)
Monocytes Relative: 4 %
Neutro Abs: 2.8 10*3/uL (ref 1.7–7.7)
Neutrophils Relative %: 63 %
Platelet Count: 255 10*3/uL (ref 150–400)
RBC: 3.42 MIL/uL — ABNORMAL LOW (ref 3.87–5.11)
RDW: 24.3 % — ABNORMAL HIGH (ref 11.5–15.5)
Smear Review: NORMAL
WBC Count: 4.4 10*3/uL (ref 4.0–10.5)
nRBC: 0 % (ref 0.0–0.2)

## 2024-05-23 LAB — CMP (CANCER CENTER ONLY)
ALT: 22 U/L (ref 0–44)
AST: 12 U/L — ABNORMAL LOW (ref 15–41)
Albumin: 3.9 g/dL (ref 3.5–5.0)
Alkaline Phosphatase: 76 U/L (ref 38–126)
Anion gap: 7 (ref 5–15)
BUN: 12 mg/dL (ref 8–23)
CO2: 29 mmol/L (ref 22–32)
Calcium: 9.1 mg/dL (ref 8.9–10.3)
Chloride: 105 mmol/L (ref 98–111)
Creatinine: 0.89 mg/dL (ref 0.44–1.00)
GFR, Estimated: >60 mL/min (ref >60)
Glucose, Bld: 81 mg/dL (ref 70–99)
Potassium: 4.1 mmol/L (ref 3.5–5.1)
Sodium: 141 mmol/L (ref 135–145)
Total Bilirubin: 0.6 mg/dL (ref 0.0–1.2)
Total Protein: 6 g/dL — ABNORMAL LOW (ref 6.5–8.1)

## 2024-05-23 LAB — FERRITIN: Ferritin: 268 ng/mL (ref 11–307)

## 2024-05-23 LAB — VITAMIN B12: Vitamin B-12: 398 pg/mL (ref 180–914)

## 2024-05-23 LAB — FOLATE: Folate: 15.5 ng/mL (ref >5.9)

## 2024-05-23 MED ORDER — MIRTAZAPINE 7.5 MG PO TABS
7.5000 mg | ORAL_TABLET | Freq: Every day | ORAL | 5 refills | Status: DC
  Filled 2024-05-23: qty 30, 30d supply, fill #0

## 2024-05-23 NOTE — Progress Notes (Signed)
 Hematology and Oncology Follow Up Visit  GREGORIA SELVY [id number] 04/20/1946 78 y.o. 05/23/2024   Principle Diagnosis:  Essential thrombocythemia - JAK2 POSITIVE Sickle cell trait  Current Therapy:   Hydrea  500 mg PO TID - dose changed on 05/20/2019 Folic acid  1 mg by mouth daily Aspirin  81 mg daily  Oral iron once daily   Interim History:  Ms. Bene is here today for follow-up. S/p endoscopy/colonoscopy 2023- benign polyps. She had a CT of abdomen/pelvis on 12/03/2021 that showed a soft tissue density of the stomach concerning for malignancy. Fortunately her endoscopy was reassuring.   Today she states that she has been fair. She continues to lose weight secondary to poor appetite. She has tried making lifestyle and dietary changes without improvements. She continues having progressive general fatigue as well. She denies night sweats, abdominal pains, cough, SOB.   Today she reports her mood as stable without SI/HI  She has been taking her hydrea  as prescribed. No side effects other than possible decreased appetite   She has had no problems with nausea or vomiting.  She has had no problems with the sickle cell trait.  There has been no bleeding to her knowledge: denies epistaxis, gingivitis, hemoptysis, hematemesis, hematuria, melena, excessive bruising, blood donation.   Overall, I would say that her performance status is probably ECOG 1.      Wt Readings from Last 3 Encounters:  05/23/24 141 lb 6.4 oz (64.1 kg)  04/10/24 146 lb 3.2 oz (66.3 kg)  03/27/24 149 lb 12.8 oz (67.9 kg)   Medications:  Allergies as of 05/23/2024       Reactions   Statins Other (See Comments)   Leg cramps   Penicillins Itching, Rash   Did it involve swelling of the face/tongue/throat, SOB, or low BP? N Did it involve sudden or severe rash/hives, skin peeling, or any reaction on the inside of your mouth or nose? Y Did you need to seek medical attention at a hospital or doctor's office?  N When did it last happen? Decades Ago      If all above answers are NO, may proceed with cephalosporin use.   Pravastatin  Other (See Comments)   leg cramps        Medication List        Accurate as of May 23, 2024 12:01 PM. If you have any questions, ask your nurse or doctor.          acetaminophen  500 MG tablet Commonly known as: TYLENOL  Take 500-1,000 mg by mouth every 6 (six) hours as needed (pain.).   albuterol  108 (90 Base) MCG/ACT inhaler Commonly known as: Proventil  HFA Inhale 1-2 puffs into the lungs every 6 (six) hours as needed for wheezing or shortness of breath.   AMBULATORY NON FORMULARY MEDICATION Medication Name: oral iron once daily   aspirin  EC 81 MG tablet Take 81 mg by mouth in the morning.   Breztri  Aerosphere 160-9-4.8 MCG/ACT Aero inhaler Generic drug: budesonide -glycopyrrolate-formoterol  Inhale 2 puffs into the lungs in the morning and at bedtime.   Breztri  Aerosphere 160-9-4.8 MCG/ACT Aero inhaler Generic drug: budesonide -glycopyrrolate-formoterol  1 sample provided   CALCIUM -CHOLECALCIFEROL  PO Take 1 tablet by mouth in the morning.   CENTRUM SILVER 50+WOMEN PO Take 1 tablet by mouth in the morning.   cetirizine  10 MG tablet Commonly known as: ZyrTEC  Allergy Take 1 tablet (10 mg total) by mouth daily.   cholecalciferol  25 MCG (1000 UNIT) tablet Commonly known as: VITAMIN D3 Take 1,000 Units by mouth  daily.   cyanocobalamin  1000 MCG tablet Commonly known as: VITAMIN B12 Take 1,000 mcg by mouth in the morning and at bedtime.   fluticasone  50 MCG/ACT nasal spray Commonly known as: FLONASE  Place 1 spray into the nose 2 (two) times daily.   folic acid  400 MCG tablet Commonly known as: FOLVITE  Take 400 mcg by mouth in the morning.   gabapentin  100 MG capsule Commonly known as: NEURONTIN  Take 100 mg by mouth at bedtime.   hydroxyurea  500 MG capsule Commonly known as: HYDREA  Take 1 capsule (500 mg total) by mouth 3 (three)  times daily before meals.   ipratropium 0.02 % nebulizer solution Commonly known as: ATROVENT  Take 2.5 mLs (0.5 mg total) by nebulization every 6 (six) hours.   ipratropium 17 MCG/ACT inhaler Commonly known as: ATROVENT  HFA Inhale 2 puffs into the lungs every 6 (six) hours as needed for wheezing.   levalbuterol  0.63 MG/3ML nebulizer solution Commonly known as: XOPENEX  Take 3 mLs (0.63 mg total) by nebulization every 6 (six) hours.   magnesium  oxide 400 MG tablet Commonly known as: MAG-OX Take 400 mg by mouth in the morning.   methotrexate  2.5 MG tablet Commonly known as: RHEUMATREX Take 15 mg by mouth once a week. Take on Monday   montelukast  10 MG tablet Commonly known as: SINGULAIR  Take 1 tablet (10 mg total) by mouth at bedtime.   pantoprazole  20 MG tablet Commonly known as: PROTONIX  Take 20 mg by mouth 2 (two) times daily.   predniSONE  20 MG tablet Commonly known as: DELTASONE  Take 1 tablet (20 mg total) by mouth daily with breakfast.   rosuvastatin  10 MG tablet Commonly known as: CRESTOR  Take 10 mg by mouth at bedtime.        Allergies:  Allergies  Allergen Reactions   Statins Other (See Comments)    Leg cramps   Penicillins Itching and Rash    Did it involve swelling of the face/tongue/throat, SOB, or low BP? N Did it involve sudden or severe rash/hives, skin peeling, or any reaction on the inside of your mouth or nose? Y Did you need to seek medical attention at a hospital or doctor's office? N When did it last happen? Decades Ago      If all above answers are NO, may proceed with cephalosporin use.     Pravastatin  Other (See Comments)    leg cramps    Past Medical History, Surgical history, Social history, and Family History were reviewed and updated.  Review of Systems:   Review of Systems  Constitutional: Negative.   HENT: Negative.    Eyes: Negative.   Respiratory: Negative.    Cardiovascular: Negative.   Gastrointestinal: Negative.    Genitourinary: Negative.   Musculoskeletal: Negative.   Skin: Negative.   Neurological: Negative.   Endo/Heme/Allergies: Negative.   Psychiatric/Behavioral: Negative.       Physical Exam:  height is 5' 2 (1.575 m) and weight is 141 lb 6.4 oz (64.1 kg). Her oral temperature is 98.3 F (36.8 C). Her blood pressure is 111/60 and her pulse is 73. Her respiration is 18 and oxygen  saturation is 98%.   Wt Readings from Last 3 Encounters:  05/23/24 141 lb 6.4 oz (64.1 kg)  04/10/24 146 lb 3.2 oz (66.3 kg)  03/27/24 149 lb 12.8 oz (67.9 kg)   Physical Activity: Inactive (03/01/2021)   Exercise Vital Sign    Days of Exercise per Week: 0 days    Minutes of Exercise per Session: 0 min  Physical Exam Vitals reviewed.  HENT:     Head: Normocephalic and atraumatic.   Eyes:     Pupils: Pupils are equal, round, and reactive to light.    Cardiovascular:     Rate and Rhythm: Normal rate and regular rhythm.     Heart sounds: Normal heart sounds.  Pulmonary:     Effort: Pulmonary effort is normal.     Breath sounds: No wheezing.  Abdominal:     General: Bowel sounds are normal.     Palpations: Abdomen is soft.   Musculoskeletal:        General: No tenderness or deformity. Normal range of motion.     Cervical back: Normal range of motion.  Lymphadenopathy:     Cervical: No cervical adenopathy.   Skin:    General: Skin is warm and dry.     Findings: No erythema or rash.   Neurological:     Mental Status: She is alert and oriented to person, place, and time.   Psychiatric:        Behavior: Behavior normal.        Thought Content: Thought content normal.        Judgment: Judgment normal.      Lab Results  Component Value Date   WBC 4.4 05/23/2024   HGB 9.7 (L) 05/23/2024   HCT 30.7 (L) 05/23/2024   MCV 89.8 05/23/2024   PLT 255 05/23/2024   Lab Results  Component Value Date   FERRITIN 88 09/21/2023   IRON 103 09/21/2023   TIBC 217 (L) 09/21/2023   UIBC 114 (L)  09/21/2023   IRONPCTSAT 48 (H) 09/21/2023   Lab Results  Component Value Date   RETICCTPCT 0.8 06/20/2019   RBC 3.42 (L) 05/23/2024   RETICCTABS 73.1 08/06/2015   No results found for: [facility] Lab Results  Component Value Date   IGGSERUM 685 09/07/2020   IGA 293 09/07/2020   IGMSERUM 26 09/07/2020   No results found for: Hobson Luna, SPEI   Chemistry      Component Value Date/Time   NA 141 05/23/2024 1058   NA 145 11/24/2017 0754   NA 142 05/25/2017 0755   K 4.1 05/23/2024 1058   K 3.7 11/24/2017 0754   K 3.9 05/25/2017 0755   CL 105 05/23/2024 1058   CL 104 11/24/2017 0754   CO2 29 05/23/2024 1058   CO2 27 11/24/2017 0754   CO2 24 05/25/2017 0755   BUN 12 05/23/2024 1058   BUN 12 11/24/2017 0754   BUN 13.3 05/25/2017 0755   CREATININE 0.89 05/23/2024 1058   CREATININE 1.0 11/24/2017 0754   CREATININE 0.8 05/25/2017 0755      Component Value Date/Time   CALCIUM  9.1 05/23/2024 1058   CALCIUM  9.1 11/24/2017 0754   CALCIUM  9.5 05/25/2017 0755   ALKPHOS 76 05/23/2024 1058   ALKPHOS 70 11/24/2017 0754   ALKPHOS 74 05/25/2017 0755   AST 12 (L) 05/23/2024 1058   AST 21 05/25/2017 0755   ALT 22 05/23/2024 1058   ALT 18 11/24/2017 0754   ALT 14 05/25/2017 0755   BILITOT 0.6 05/23/2024 1058   BILITOT 0.37 05/25/2017 0755     Encounter Diagnoses  Name Primary?   Other fatigue Yes   Unintentional weight loss     Impression and Plan: Ms. Witter is a very pleasant 78 yo [place] American female with essential thrombocythemia, that is JAK2 positive.  In addition, she has  the sickle cell trait. She is on a once daily 81 mg asa.   Adding a TSH onto labs for today Given unintentional weight loss and former CT findings will repeat imaging of the body with CT C/A/P especially considering her elevated cancer risk being on hydrea .  She wishes to trial medication to  help with her appetite which has continue to decline secondary to her depressed mood, hydrea  use. Progestins/corticosteroids not ideal given her history and thromboembolic risk.  Will trial low dose Mirtazapine given her concurrent depressive symptoms. No SI/HI. Discussed how to take along with common potential side effects and precautions.  Today her CBC shows a normal platelet count but new anemia. May be secondary to nutritional deficiencies but I would like to re-image her GI system as shown above.  CMP shows some improvements in protein levels.   Disposition CT Chest/AB/Pelvis signed Starting on low dose mirtazapine.  RTC 1 month APP, labs (CBC w/, iron, ferritin, folate, B12, folate, retic, LDH, epo)-[place]   Sharla Davis, [place] 6/19/202512:01 PM

## 2024-05-24 LAB — THYROID PANEL WITH TSH
Free Thyroxine Index: 1.7 (ref 1.2–4.9)
T3 Uptake Ratio: 26 % (ref 24–39)
T4, Total: 6.6 ug/dL (ref 4.5–12.0)
TSH: 1.22 u[IU]/mL (ref 0.450–4.500)

## 2024-05-24 LAB — IRON AND IRON BINDING CAPACITY (CC-WL,HP ONLY)
Iron: 165 ug/dL (ref 28–170)
Saturation Ratios: 78 % — ABNORMAL HIGH (ref 10.4–31.8)
TIBC: 213 ug/dL — ABNORMAL LOW (ref 250–450)
UIBC: 48 ug/dL

## 2024-05-27 ENCOUNTER — Ambulatory Visit: Payer: Self-pay | Admitting: Medical Oncology

## 2024-05-29 ENCOUNTER — Ambulatory Visit: Payer: Self-pay | Admitting: Medical Oncology

## 2024-05-29 NOTE — Telephone Encounter (Signed)
-----   Message from Lauraine CHRISTELLA Dais sent at 05/29/2024 10:13 AM EDT ----- Have her hold her oral iron.  Have her start a daily B12 1,000 mcg supplement please  ----- Message ----- From: Interface, Lab In [place] Sent: 05/23/2024  11:35 AM EDT To: Lauraine CHRISTELLA Dais, PA-C

## 2024-05-29 NOTE — Telephone Encounter (Signed)
 Per Lauraine Dais, PA, I instructed the patient to HOLD her oral iron and start a daily B12 1,000 mcg supplement. You can purchase this over the counter. She verbalized understanding.

## 2024-05-30 ENCOUNTER — Other Ambulatory Visit: Payer: Self-pay | Admitting: Emergency Medicine

## 2024-05-30 MED ORDER — ALBUTEROL SULFATE HFA 108 (90 BASE) MCG/ACT IN AERS: 1.0000 | INHALATION_SPRAY | Freq: Four times a day (QID) | RESPIRATORY_TRACT | 3 refills | Status: AC | PRN

## 2024-06-03 ENCOUNTER — Other Ambulatory Visit (HOSPITAL_BASED_OUTPATIENT_CLINIC_OR_DEPARTMENT_OTHER): Payer: Self-pay

## 2024-06-03 DIAGNOSIS — Z1231 Encounter for screening mammogram for malignant neoplasm of breast: Secondary | ICD-10-CM | POA: Diagnosis not present

## 2024-06-04 ENCOUNTER — Telehealth: Payer: Self-pay | Admitting: Emergency Medicine

## 2024-06-04 MED ORDER — PANTOPRAZOLE SODIUM 20 MG PO TBEC: 20.0000 mg | DELAYED_RELEASE_TABLET | Freq: Two times a day (BID) | ORAL | 1 refills | Status: DC

## 2024-06-04 MED ORDER — PANTOPRAZOLE SODIUM 20 MG PO TBEC: 20.0000 mg | DELAYED_RELEASE_TABLET | Freq: Two times a day (BID) | ORAL | 1 refills | Status: AC

## 2024-06-04 NOTE — Telephone Encounter (Signed)
 Copied from CRM [id number]. Topic: Clinical - Medication Refill >> Jun 04, 2024 12:05 PM Whitney O wrote: Medication: omeprazole  20 mg  Has the patient contacted their pharmacy? Yes pharmacy calling for refill  (Agent: If no, request that the patient contact the pharmacy for the refill. If patient does not wish to contact the pharmacy document the reason why and proceed with request.) (Agent: If yes, when and what did the pharmacy advise?)  This is the patient's preferred pharmacy:  [facility] [place] - [address] [place] [address] Phone: [phone] Fax: [phone]  [id number] [place], [place] - [address] AT [facility] [address] [place] [address] Phone: [phone] Fax: [phone]  [facility] - [place], [place] - [address] [facility] [address] [place] [address] [place] [address] Phone: [phone] Fax: [phone]  Is this the correct pharmacy for this prescription? Yes If no, delete pharmacy and type the correct one.  [facility] [place] - [address] [place] [address] Phone: [phone] Fax: [phone]   Has the prescription been filled recently? No  Is the patient out of the medication? Yes  Has the patient been seen for an appointment in the last year OR does the patient have an upcoming appointment? Yes 03/27/2024  Can we respond through MyChart? No  Agent: Please be advised that Rx refills may take up to 3 business days. We ask that you follow-up with your pharmacy.

## 2024-06-04 NOTE — Telephone Encounter (Signed)
 [facility] called to clarify Rx request. Omeprazole  is not on pt med profile, but pantoprazole  is.   Patient reports that Dr. Shelah advised Omeprazole  (unknown mg) 1 cap twice daily. Pt confirmed medication while on phone and requesting refill to be sent to: [id number] - [place], [place] - [address] AT [facility] OF [address] [place] [address] Phone: [phone] Fax: [phone]  [facility] will forward encounter for Dr. Lanny to review and refill.

## 2024-06-04 NOTE — Telephone Encounter (Signed)
 I have called and spoke with [name]. I informed her Omeprazole  was not on her med list and in her lov note Dr. Shelah wanted her to continue taking Pantoprazole  and there was no mention about Omeprazole . I advised pt contact PCP for Omeprazole  refills. Pt also asked to refill her pantoprazole , sending rx to preferred pharmacy. Pt states she is taking what Dr. Shelah told her to take in her lov note. Pt verbalized understanding and had no concerns. Nfn Pantoprazole  has been sent to pharmacy.

## 2024-06-13 DIAGNOSIS — N182 Chronic kidney disease, stage 2 (mild): Secondary | ICD-10-CM | POA: Diagnosis not present

## 2024-06-13 DIAGNOSIS — J849 Interstitial pulmonary disease, unspecified: Secondary | ICD-10-CM | POA: Diagnosis not present

## 2024-06-13 DIAGNOSIS — M059 Rheumatoid arthritis with rheumatoid factor, unspecified: Secondary | ICD-10-CM | POA: Diagnosis not present

## 2024-06-13 DIAGNOSIS — J432 Centrilobular emphysema: Secondary | ICD-10-CM | POA: Diagnosis not present

## 2024-06-13 DIAGNOSIS — E782 Mixed hyperlipidemia: Secondary | ICD-10-CM | POA: Diagnosis not present

## 2024-06-13 DIAGNOSIS — D473 Essential (hemorrhagic) thrombocythemia: Secondary | ICD-10-CM | POA: Diagnosis not present

## 2024-06-17 ENCOUNTER — Encounter (HOSPITAL_BASED_OUTPATIENT_CLINIC_OR_DEPARTMENT_OTHER): Payer: Self-pay

## 2024-06-17 ENCOUNTER — Ambulatory Visit (HOSPITAL_BASED_OUTPATIENT_CLINIC_OR_DEPARTMENT_OTHER)
Admission: RE | Admit: 2024-06-17 | Discharge: 2024-06-17 | Disposition: A | Discharge status: Home / Self Care | Source: Ambulatory Visit | Attending: Medical Oncology | Admitting: Medical Oncology

## 2024-06-17 DIAGNOSIS — Z1589 Genetic susceptibility to other disease: Secondary | ICD-10-CM | POA: Diagnosis present

## 2024-06-17 DIAGNOSIS — D473 Essential (hemorrhagic) thrombocythemia: Secondary | ICD-10-CM | POA: Diagnosis not present

## 2024-06-17 DIAGNOSIS — R935 Abnormal findings on diagnostic imaging of other abdominal regions, including retroperitoneum: Secondary | ICD-10-CM | POA: Diagnosis not present

## 2024-06-17 DIAGNOSIS — R4589 Other symptoms and signs involving emotional state: Secondary | ICD-10-CM | POA: Insufficient documentation

## 2024-06-17 DIAGNOSIS — J432 Centrilobular emphysema: Secondary | ICD-10-CM | POA: Diagnosis not present

## 2024-06-17 DIAGNOSIS — R5383 Other fatigue: Secondary | ICD-10-CM | POA: Diagnosis not present

## 2024-06-17 DIAGNOSIS — R634 Abnormal weight loss: Secondary | ICD-10-CM | POA: Diagnosis not present

## 2024-06-17 MED ORDER — IOHEXOL 300 MG/ML  SOLN
100.0000 mL | Freq: Once | INTRAMUSCULAR | Status: AC | PRN
  Administered 2024-06-17: 100 mL via INTRAVENOUS

## 2024-06-18 NOTE — Telephone Encounter (Signed)
-----   Message from [name] sent at 06/18/2024  4:07 PM EDT ----- Fantastic CT results. No cause for her weight loss on exam- aka nothing concerning for cancer. The area of previous concern has resolved and likely represented digesting food. She does have a hiatal  hernia, some constipation, plaque build up of her vascular system and some COPD changes that she should discuss further with her PCP.  ----- Message ----- From: Interface, Rad Results In Sent: 06/18/2024   3:50 PM EDT To: [name] CHRISTELLA Dais, PA-C

## 2024-06-18 NOTE — Telephone Encounter (Signed)
 As noted below by Lauraine Dais, PA, I called and verbally gave her this message.  Fantastic CT results. No cause for her weight loss on exam- aka nothing concerning for cancer. The area of previous concern has resolved and likely represented digesting food. She does have a hiatal hernia, some constipation, plaque build up of her vascular system and some COPD changes that she should discuss further with her PCP. Patient verbalized understanding.

## 2024-06-20 ENCOUNTER — Inpatient Hospital Stay: Admitting: Medical Oncology

## 2024-06-20 ENCOUNTER — Inpatient Hospital Stay: Attending: Hematology & Oncology

## 2024-06-20 DIAGNOSIS — D649 Anemia, unspecified: Secondary | ICD-10-CM | POA: Diagnosis not present

## 2024-06-20 DIAGNOSIS — M059 Rheumatoid arthritis with rheumatoid factor, unspecified: Secondary | ICD-10-CM | POA: Diagnosis not present

## 2024-06-20 DIAGNOSIS — D509 Iron deficiency anemia, unspecified: Secondary | ICD-10-CM | POA: Insufficient documentation

## 2024-06-20 DIAGNOSIS — D473 Essential (hemorrhagic) thrombocythemia: Secondary | ICD-10-CM | POA: Diagnosis not present

## 2024-06-20 DIAGNOSIS — G250 Essential tremor: Secondary | ICD-10-CM | POA: Diagnosis not present

## 2024-06-20 DIAGNOSIS — D75839 Thrombocytosis, unspecified: Secondary | ICD-10-CM | POA: Insufficient documentation

## 2024-06-20 DIAGNOSIS — E782 Mixed hyperlipidemia: Secondary | ICD-10-CM | POA: Diagnosis not present

## 2024-06-20 DIAGNOSIS — D573 Sickle-cell trait: Secondary | ICD-10-CM | POA: Insufficient documentation

## 2024-06-20 DIAGNOSIS — E538 Deficiency of other specified B group vitamins: Secondary | ICD-10-CM | POA: Insufficient documentation

## 2024-06-20 DIAGNOSIS — R634 Abnormal weight loss: Secondary | ICD-10-CM | POA: Diagnosis not present

## 2024-06-20 DIAGNOSIS — I700 Atherosclerosis of aorta: Secondary | ICD-10-CM | POA: Diagnosis not present

## 2024-06-20 DIAGNOSIS — Z7982 Long term (current) use of aspirin: Secondary | ICD-10-CM | POA: Insufficient documentation

## 2024-06-20 DIAGNOSIS — N182 Chronic kidney disease, stage 2 (mild): Secondary | ICD-10-CM | POA: Diagnosis not present

## 2024-06-20 DIAGNOSIS — Z0000 Encounter for general adult medical examination without abnormal findings: Secondary | ICD-10-CM | POA: Diagnosis not present

## 2024-06-20 DIAGNOSIS — J432 Centrilobular emphysema: Secondary | ICD-10-CM | POA: Diagnosis not present

## 2024-06-20 DIAGNOSIS — J849 Interstitial pulmonary disease, unspecified: Secondary | ICD-10-CM | POA: Diagnosis not present

## 2024-06-20 DIAGNOSIS — G4762 Sleep related leg cramps: Secondary | ICD-10-CM | POA: Diagnosis not present

## 2024-06-25 ENCOUNTER — Telehealth: Payer: Self-pay | Admitting: Medical Oncology

## 2024-06-25 NOTE — Telephone Encounter (Signed)
 Lvm for patient to return call for rescheduling missed office visit on 06/20/24.

## 2024-06-26 DIAGNOSIS — D649 Anemia, unspecified: Secondary | ICD-10-CM | POA: Diagnosis not present

## 2024-06-26 DIAGNOSIS — G250 Essential tremor: Secondary | ICD-10-CM | POA: Diagnosis not present

## 2024-06-26 DIAGNOSIS — I700 Atherosclerosis of aorta: Secondary | ICD-10-CM | POA: Diagnosis not present

## 2024-06-26 DIAGNOSIS — N182 Chronic kidney disease, stage 2 (mild): Secondary | ICD-10-CM | POA: Diagnosis not present

## 2024-06-26 DIAGNOSIS — J849 Interstitial pulmonary disease, unspecified: Secondary | ICD-10-CM | POA: Diagnosis not present

## 2024-06-26 DIAGNOSIS — G4762 Sleep related leg cramps: Secondary | ICD-10-CM | POA: Diagnosis not present

## 2024-06-26 DIAGNOSIS — E782 Mixed hyperlipidemia: Secondary | ICD-10-CM | POA: Diagnosis not present

## 2024-06-26 DIAGNOSIS — D473 Essential (hemorrhagic) thrombocythemia: Secondary | ICD-10-CM | POA: Diagnosis not present

## 2024-06-26 DIAGNOSIS — M059 Rheumatoid arthritis with rheumatoid factor, unspecified: Secondary | ICD-10-CM | POA: Diagnosis not present

## 2024-06-26 DIAGNOSIS — R634 Abnormal weight loss: Secondary | ICD-10-CM | POA: Diagnosis not present

## 2024-06-26 DIAGNOSIS — J432 Centrilobular emphysema: Secondary | ICD-10-CM | POA: Diagnosis not present

## 2024-06-28 ENCOUNTER — Other Ambulatory Visit (HOSPITAL_BASED_OUTPATIENT_CLINIC_OR_DEPARTMENT_OTHER): Payer: Self-pay

## 2024-07-01 ENCOUNTER — Inpatient Hospital Stay (HOSPITAL_BASED_OUTPATIENT_CLINIC_OR_DEPARTMENT_OTHER): Admitting: Medical Oncology

## 2024-07-01 ENCOUNTER — Encounter: Payer: Self-pay | Admitting: Medical Oncology

## 2024-07-01 ENCOUNTER — Inpatient Hospital Stay

## 2024-07-01 VITALS — BP 106/58 | HR 80 | Temp 98.0°F | Resp 18 | Ht 62.0 in | Wt 136.0 lb

## 2024-07-01 DIAGNOSIS — D509 Iron deficiency anemia, unspecified: Secondary | ICD-10-CM

## 2024-07-01 DIAGNOSIS — Z1589 Genetic susceptibility to other disease: Secondary | ICD-10-CM

## 2024-07-01 DIAGNOSIS — R5383 Other fatigue: Secondary | ICD-10-CM

## 2024-07-01 DIAGNOSIS — E538 Deficiency of other specified B group vitamins: Secondary | ICD-10-CM

## 2024-07-01 DIAGNOSIS — E639 Nutritional deficiency, unspecified: Secondary | ICD-10-CM | POA: Diagnosis not present

## 2024-07-01 DIAGNOSIS — D473 Essential (hemorrhagic) thrombocythemia: Secondary | ICD-10-CM

## 2024-07-01 DIAGNOSIS — D75839 Thrombocytosis, unspecified: Secondary | ICD-10-CM | POA: Diagnosis not present

## 2024-07-01 DIAGNOSIS — D573 Sickle-cell trait: Secondary | ICD-10-CM

## 2024-07-01 DIAGNOSIS — R4589 Other symptoms and signs involving emotional state: Secondary | ICD-10-CM

## 2024-07-01 DIAGNOSIS — R935 Abnormal findings on diagnostic imaging of other abdominal regions, including retroperitoneum: Secondary | ICD-10-CM

## 2024-07-01 DIAGNOSIS — R634 Abnormal weight loss: Secondary | ICD-10-CM

## 2024-07-01 DIAGNOSIS — Z7982 Long term (current) use of aspirin: Secondary | ICD-10-CM | POA: Diagnosis not present

## 2024-07-01 LAB — CBC WITH DIFFERENTIAL (CANCER CENTER ONLY)
Abs Immature Granulocytes: 0.02 K/uL (ref 0.00–0.07)
Basophils Absolute: 0 K/uL (ref 0.0–0.1)
Basophils Relative: 1 %
Eosinophils Absolute: 0 K/uL (ref 0.0–0.5)
Eosinophils Relative: 0 %
HCT: 27.1 % — ABNORMAL LOW (ref 36.0–46.0)
Hemoglobin: 8.6 g/dL — ABNORMAL LOW (ref 12.0–15.0)
Immature Granulocytes: 1 %
Lymphocytes Relative: 41 %
Lymphs Abs: 1.3 K/uL (ref 0.7–4.0)
MCH: 29.5 pg (ref 26.0–34.0)
MCHC: 31.7 g/dL (ref 30.0–36.0)
MCV: 92.8 fL (ref 80.0–100.0)
Monocytes Absolute: 0.3 K/uL (ref 0.1–1.0)
Monocytes Relative: 9 %
Neutro Abs: 1.5 K/uL — ABNORMAL LOW (ref 1.7–7.7)
Neutrophils Relative %: 48 %
Platelet Count: 305 K/uL (ref 150–400)
RBC: 2.92 MIL/uL — ABNORMAL LOW (ref 3.87–5.11)
RDW: 23.2 % — ABNORMAL HIGH (ref 11.5–15.5)
WBC Count: 3.1 K/uL — ABNORMAL LOW (ref 4.0–10.5)
nRBC: 0 % (ref 0.0–0.2)

## 2024-07-01 LAB — IRON AND IRON BINDING CAPACITY (CC-WL,HP ONLY)
Iron: 96 ug/dL (ref 28–170)
Saturation Ratios: 43 % — ABNORMAL HIGH (ref 10.4–31.8)
TIBC: 224 ug/dL — ABNORMAL LOW (ref 250–450)
UIBC: 128 ug/dL

## 2024-07-01 LAB — CMP (CANCER CENTER ONLY)
ALT: 11 U/L (ref 0–44)
AST: 23 U/L (ref 15–41)
Albumin: 3.9 g/dL (ref 3.5–5.0)
Alkaline Phosphatase: 85 U/L (ref 38–126)
Anion gap: 10 (ref 5–15)
BUN: 9 mg/dL (ref 8–23)
CO2: 24 mmol/L (ref 22–32)
Calcium: 9.1 mg/dL (ref 8.9–10.3)
Chloride: 107 mmol/L (ref 98–111)
Creatinine: 0.8 mg/dL (ref 0.44–1.00)
GFR, Estimated: >60 mL/min (ref >60)
Glucose, Bld: 79 mg/dL (ref 70–99)
Potassium: 3.7 mmol/L (ref 3.5–5.1)
Sodium: 141 mmol/L (ref 135–145)
Total Bilirubin: 0.5 mg/dL (ref 0.0–1.2)
Total Protein: 6.1 g/dL — ABNORMAL LOW (ref 6.5–8.1)

## 2024-07-01 LAB — RETIC PANEL
Immature Retic Fract: 33.2 % — ABNORMAL HIGH (ref 2.3–15.9)
RBC.: 2.89 MIL/uL — ABNORMAL LOW (ref 3.87–5.11)
Retic Count, Absolute: 75.7 K/uL (ref 19.0–186.0)
Retic Ct Pct: 2.6 % (ref 0.4–3.1)
Reticulocyte Hemoglobin: 28.5 pg (ref >27.9)

## 2024-07-01 LAB — FOLATE: Folate: 13.5 ng/mL (ref >5.9)

## 2024-07-01 LAB — LACTATE DEHYDROGENASE: LDH: 259 U/L — ABNORMAL HIGH (ref 98–192)

## 2024-07-01 LAB — FERRITIN: Ferritin: 185 ng/mL (ref 11–307)

## 2024-07-01 LAB — VITAMIN B12: Vitamin B-12: 1235 pg/mL — ABNORMAL HIGH (ref 180–914)

## 2024-07-01 NOTE — Progress Notes (Signed)
 Hematology and Oncology Follow Up Visit  SHAINA GULLATT [id number] 01-10-1946 78 y.o. 07/01/2024   Principle Diagnosis:  Essential thrombocythemia - JAK2 POSITIVE Sickle cell trait Nutritional Deficiency  Current Therapy:   Hydrea  500 mg PO TID - dose changed on 05/20/2019 Folic acid  1 mg by mouth daily Aspirin  81 mg daily  Oral iron once daily   Interim History:  Ms. Brotzman is here today for follow-up. S/p endoscopy/colonoscopy 2023- benign polyps. She had a CT of abdomen/pelvis on 12/03/2021 that showed a soft tissue density of the stomach concerning for malignancy. Fortunately her endoscopy was reassuring.   Today she states that she has been more tired than normal. Her PCP recently started her on Paroxetine 20 mg to help with her appetite and prevent further weight loss. She started this medication about 3 days ago. She has not noticed any difference in appetite yet. She denies night sweats, abdominal pains, cough, SOB.   She reports her mood as stable without SI/HI  She has been taking her hydrea  as prescribed. No side effects other than possible decreased appetite   She has had no problems with nausea or vomiting.  She has had no problems with the sickle cell trait.  There has been no bleeding to her knowledge: denies epistaxis, gingivitis, hemoptysis, hematemesis, hematuria, melena, excessive bruising, blood donation.   Overall, I would say that her performance status is probably ECOG 1.      Wt Readings from Last 3 Encounters:  07/01/24 136 lb (61.7 kg)  05/23/24 141 lb 6.4 oz (64.1 kg)  04/10/24 146 lb 3.2 oz (66.3 kg)   Medications:  Allergies as of 07/01/2024       Reactions   Statins Other (See Comments)   Leg cramps   Penicillins Itching, Rash   Did it involve swelling of the face/tongue/throat, SOB, or low BP? N Did it involve sudden or severe rash/hives, skin peeling, or any reaction on the inside of your mouth or nose? Y Did you need to seek medical  attention at a hospital or doctor's office? N When did it last happen? Decades Ago      If all above answers are NO, may proceed with cephalosporin use.   Pravastatin  Other (See Comments)   leg cramps        Medication List        Accurate as of July 01, 2024 10:31 AM. If you have any questions, ask your nurse or doctor.          STOP taking these medications    predniSONE  20 MG tablet Commonly known as: DELTASONE  Stopped by: Lauraine CHRISTELLA Dais       TAKE these medications    acetaminophen  500 MG tablet Commonly known as: TYLENOL  Take 500-1,000 mg by mouth every 6 (six) hours as needed (pain.).   albuterol  108 (90 Base) MCG/ACT inhaler Commonly known as: Proventil  HFA Inhale 1-2 puffs into the lungs every 6 (six) hours as needed for wheezing or shortness of breath.   AMBULATORY NON FORMULARY MEDICATION Medication Name: oral iron once daily   aspirin  EC 81 MG tablet Take 81 mg by mouth in the morning.   Breztri  Aerosphere 160-9-4.8 MCG/ACT Aero inhaler Generic drug: budesonide -glycopyrrolate-formoterol  Inhale 2 puffs into the lungs in the morning and at bedtime.   Breztri  Aerosphere 160-9-4.8 MCG/ACT Aero inhaler Generic drug: budesonide -glycopyrrolate-formoterol  1 sample provided   CALCIUM -CHOLECALCIFEROL  PO Take 1 tablet by mouth in the morning.   CENTRUM SILVER 50+WOMEN PO Take 1 tablet  by mouth in the morning.   cetirizine  10 MG tablet Commonly known as: ZyrTEC  Allergy Take 1 tablet (10 mg total) by mouth daily.   cholecalciferol  25 MCG (1000 UNIT) tablet Commonly known as: VITAMIN D3 Take 1,000 Units by mouth daily.   cyanocobalamin  1000 MCG tablet Commonly known as: VITAMIN B12 Take 1,000 mcg by mouth in the morning and at bedtime.   fluticasone  50 MCG/ACT nasal spray Commonly known as: FLONASE  Place 1 spray into the nose 2 (two) times daily.   folic acid  400 MCG tablet Commonly known as: FOLVITE  Take 400 mcg by mouth in the morning.    gabapentin  100 MG capsule Commonly known as: NEURONTIN  Take 100 mg by mouth at bedtime.   hydroxyurea  500 MG capsule Commonly known as: HYDREA  Take 1 capsule (500 mg total) by mouth 3 (three) times daily before meals.   ipratropium 0.02 % nebulizer solution Commonly known as: ATROVENT  Take 2.5 mLs (0.5 mg total) by nebulization every 6 (six) hours.   ipratropium 17 MCG/ACT inhaler Commonly known as: ATROVENT  HFA Inhale 2 puffs into the lungs every 6 (six) hours as needed for wheezing.   levalbuterol  0.63 MG/3ML nebulizer solution Commonly known as: XOPENEX  Take 3 mLs (0.63 mg total) by nebulization every 6 (six) hours.   magnesium  oxide 400 MG tablet Commonly known as: MAG-OX Take 400 mg by mouth in the morning.   methotrexate  2.5 MG tablet Commonly known as: RHEUMATREX Take 15 mg by mouth once a week. Take on Monday   mirtazapine  7.5 MG tablet Commonly known as: REMERON  Take 1 tablet (7.5 mg total) by mouth at bedtime.   montelukast  10 MG tablet Commonly known as: SINGULAIR  Take 1 tablet (10 mg total) by mouth at bedtime.   pantoprazole  20 MG tablet Commonly known as: PROTONIX  Take 1 tablet (20 mg total) by mouth 2 (two) times daily.   rosuvastatin  10 MG tablet Commonly known as: CRESTOR  Take 10 mg by mouth at bedtime.        Allergies:  Allergies  Allergen Reactions   Statins Other (See Comments)    Leg cramps   Penicillins Itching and Rash    Did it involve swelling of the face/tongue/throat, SOB, or low BP? N Did it involve sudden or severe rash/hives, skin peeling, or any reaction on the inside of your mouth or nose? Y Did you need to seek medical attention at a hospital or doctor's office? N When did it last happen? Decades Ago      If all above answers are NO, may proceed with cephalosporin use.     Pravastatin  Other (See Comments)    leg cramps    Past Medical History, Surgical history, Social history, and Family History were reviewed and  updated.  Review of Systems:   Review of Systems  Constitutional: Negative.   HENT: Negative.    Eyes: Negative.   Respiratory: Negative.    Cardiovascular: Negative.   Gastrointestinal: Negative.   Genitourinary: Negative.   Musculoskeletal: Negative.   Skin: Negative.   Neurological: Negative.   Endo/Heme/Allergies: Negative.   Psychiatric/Behavioral: Negative.       Physical Exam:  height is 5' 2 (1.575 m) and weight is 136 lb (61.7 kg). Her oral temperature is 98 F (36.7 C). Her blood pressure is 106/58 (abnormal) and her pulse is 80. Her respiration is 18 and oxygen  saturation is 96%.   Wt Readings from Last 3 Encounters:  07/01/24 136 lb (61.7 kg)  05/23/24 141 lb 6.4 oz (  64.1 kg)  04/10/24 146 lb 3.2 oz (66.3 kg)   Physical Activity: Inactive (03/01/2021)   Exercise Vital Sign    Days of Exercise per Week: 0 days    Minutes of Exercise per Session: 0 min     Physical Exam Vitals reviewed.  HENT:     Head: Normocephalic and atraumatic.  Eyes:     Pupils: Pupils are equal, round, and reactive to light.  Cardiovascular:     Rate and Rhythm: Normal rate and regular rhythm.     Heart sounds: Normal heart sounds.  Pulmonary:     Effort: Pulmonary effort is normal.     Breath sounds: No wheezing.  Abdominal:     General: Bowel sounds are normal.     Palpations: Abdomen is soft.  Musculoskeletal:        General: No tenderness or deformity. Normal range of motion.     Cervical back: Normal range of motion.  Lymphadenopathy:     Cervical: No cervical adenopathy.  Skin:    General: Skin is warm and dry.     Findings: No erythema or rash.  Neurological:     Mental Status: She is alert and oriented to person, place, and time.  Psychiatric:        Behavior: Behavior normal.        Thought Content: Thought content normal.        Judgment: Judgment normal.      Lab Results  Component Value Date   WBC 3.1 (L) 07/01/2024   HGB 8.6 (L) 07/01/2024   HCT  27.1 (L) 07/01/2024   MCV 92.8 07/01/2024   PLT 305 07/01/2024   Lab Results  Component Value Date   FERRITIN 268 05/23/2024   IRON 165 05/23/2024   TIBC 213 (L) 05/23/2024   UIBC 48 05/23/2024   IRONPCTSAT 78 (H) 05/23/2024   Lab Results  Component Value Date   RETICCTPCT 2.6 07/01/2024   RBC 2.92 (L) 07/01/2024   RBC 2.89 (L) 07/01/2024   RETICCTABS 73.1 08/06/2015   No results found for: JONATHAN BONG [facility] Lab Results  Component Value Date   IGGSERUM 685 09/07/2020   IGA 293 09/07/2020   IGMSERUM 26 09/07/2020   No results found for: STEPHANY CARLOTA BENSON MARKEL EARLA JOANNIE DOC VICK, SPEI   Chemistry      Component Value Date/Time   NA 141 05/23/2024 1058   NA 145 11/24/2017 0754   NA 142 05/25/2017 0755   K 4.1 05/23/2024 1058   K 3.7 11/24/2017 0754   K 3.9 05/25/2017 0755   CL 105 05/23/2024 1058   CL 104 11/24/2017 0754   CO2 29 05/23/2024 1058   CO2 27 11/24/2017 0754   CO2 24 05/25/2017 0755   BUN 12 05/23/2024 1058   BUN 12 11/24/2017 0754   BUN 13.3 05/25/2017 0755   CREATININE 0.89 05/23/2024 1058   CREATININE 1.0 11/24/2017 0754   CREATININE 0.8 05/25/2017 0755      Component Value Date/Time   CALCIUM  9.1 05/23/2024 1058   CALCIUM  9.1 11/24/2017 0754   CALCIUM  9.5 05/25/2017 0755   ALKPHOS 76 05/23/2024 1058   ALKPHOS 70 11/24/2017 0754   ALKPHOS 74 05/25/2017 0755   AST 12 (L) 05/23/2024 1058   AST 21 05/25/2017 0755   ALT 22 05/23/2024 1058   ALT 18 11/24/2017 0754   ALT 14 05/25/2017 0755   BILITOT 0.6 05/23/2024 1058   BILITOT 0.37 05/25/2017 0755     Encounter  Diagnoses  Name Primary?   Essential thrombocythemia (HCC) Yes   JAK-2 gene mutation    Poor nutrition    Iron deficiency anemia, unspecified iron deficiency anemia type    B12 deficiency    Sickle cell trait (HCC)    Impression and Plan: Ms. Wedekind is a very pleasant 78 yo [place] American female with  essential thrombocythemia, that is JAK2 positive.  In addition, she has sickle cell trait and nutritional deficiency. She is on a once daily 81 mg asa.   Reducing Hydrea  to BID from TID given worsened neutropenia and anemia (which are also likely related to nutritional deficiencies- nutritional labs pending) Starting her on 1-2 ensures per day in additional to current oral intake.  She had a CT C/A/P on 06/17/2024 which did not show a cause for her unintentional weight loss. Previous masslike object seen resolved- likely food bulus.  PCP started her on Paxil- She will continue this and hold the Mirtazipine as she has not started this yet.    Disposition RTC 1 month APP, labs (CBC w/, iron, ferritin, folate, B12, folate, retic, LDH), injection(TBD)-[place]   Lauraine CHRISTELLA Dais, PA-C 7/28/202510:31 AM

## 2024-07-02 LAB — ERYTHROPOIETIN: Erythropoietin: 120.1 m[IU]/mL — ABNORMAL HIGH (ref 2.6–18.5)

## 2024-07-02 NOTE — Telephone Encounter (Signed)
 I called and spoke to patient regarding lab results. Per Lauraine Dais, PA, labs all show acceptable levels for Iron, B12, folate, erythropoietin . She verbalized understanding.

## 2024-07-02 NOTE — Telephone Encounter (Signed)
-----   Message from Lauraine CHRISTELLA Dais sent at 07/02/2024  2:28 PM EDT ----- Labs all show acceptable levels for Iron, B12, folate, erythropoietin   ----- Message ----- From: Interface, Lab In [place] Sent: 07/01/2024  10:13 AM EDT To: Lauraine CHRISTELLA Dais, PA-C

## 2024-07-05 NOTE — Telephone Encounter (Signed)
 2nd request received. Will route to Preop pool for the Preop App to review. Pt was seen in office 04/10/24 with Lurena Red, MD

## 2024-07-05 NOTE — Telephone Encounter (Addendum)
   Patient Name: [name]  DOB: [birth year] MRN: [id number]  Primary Cardiologist: None  Chart reviewed as part of pre-operative protocol coverage. Pre-op clearance already addressed by colleagues in earlier phone notes. To summarize recommendations:  -Low risk stress and echo.  She is at low risk for perioperative CV complications during her orthopedic surgery.  -Dr. Wendel  submitted guidance after echo and stress test on 05/15/2024  As long as no new cardiac issues since clearance was given, can move forward with left total knee arthroplasty. She can hold asa for 5-7 days prior to surgery and restart when medically safe to do so.   Will route this bundled recommendation to requesting provider via Epic fax function and remove from pre-op pool. Please call with questions.  [name], PA-C 07/05/2024, 8:44 AM

## 2024-07-09 DIAGNOSIS — M170 Bilateral primary osteoarthritis of knee: Secondary | ICD-10-CM | POA: Diagnosis not present

## 2024-07-19 NOTE — Progress Notes (Signed)
 Sent message, via epic in basket, requesting orders in epic from [facility].

## 2024-07-22 DIAGNOSIS — M25562 Pain in left knee: Secondary | ICD-10-CM | POA: Diagnosis not present

## 2024-07-22 DIAGNOSIS — M25662 Stiffness of left knee, not elsewhere classified: Secondary | ICD-10-CM | POA: Diagnosis not present

## 2024-07-22 DIAGNOSIS — M1712 Unilateral primary osteoarthritis, left knee: Secondary | ICD-10-CM | POA: Diagnosis not present

## 2024-07-22 DIAGNOSIS — G8929 Other chronic pain: Secondary | ICD-10-CM | POA: Diagnosis not present

## 2024-07-23 ENCOUNTER — Ambulatory Visit: Payer: Self-pay | Admitting: Student

## 2024-07-23 NOTE — H&P (Signed)
 TOTAL KNEE ADMISSION H&P  Patient is being admitted for left total knee arthroplasty.  Subjective:  Chief Complaint:left knee pain.  HPI: [name], 78 y.o. female, has a history of pain and functional disability in the left knee due to arthritis and has failed non-surgical conservative treatments for greater than 12 weeks to includeNSAID's and/or analgesics, corticosteriod injections, viscosupplementation injections, flexibility and strengthening excercises, use of assistive devices, and activity modification.  Onset of symptoms was gradual, starting >10 years ago with rapidlly worsening course since that time. The patient noted no past surgery on the left knee(s).  Patient currently rates pain in the left knee(s) at 10 out of 10 with activity. Patient has night pain, worsening of pain with activity and weight bearing, pain that interferes with activities of daily living, pain with passive range of motion, crepitus, and joint swelling.  Patient has evidence of subchondral cysts, subchondral sclerosis, periarticular osteophytes, and joint space narrowing by imaging studies. There is no active infection.  Patient Active Problem List   Diagnosis Date Noted   COPD (chronic obstructive pulmonary disease) (HCC) 02/06/2023   Abnormal gait 01/12/2022   Carpal tunnel syndrome 01/12/2022   Essential tremor 01/12/2022   Skin sensation disturbance 01/12/2022   History of stroke 09/04/2021   Candidiasis 09/01/2021   Chronic kidney disease, stage 2 (mild) 08/12/2021   Centrilobular emphysema (HCC) 04/13/2021   Parietoalveolar pneumopathy (HCC) 04/13/2021   Cramp and spasm 03/01/2021   Cramp in lower leg associated with rest 03/01/2021   Hardening of the aorta (main artery of the heart) (HCC) 03/01/2021   Leukopenia 03/01/2021   Menopause 03/01/2021   Multiple joint pain 03/01/2021   Obesity 03/01/2021   Rheumatoid arthritis (HCC) 03/01/2021   Severe major depression, single episode, without  psychotic features (HCC) 03/01/2021   Hyperglycemia 02/21/2021   Asthma 07/08/2020   On methotrexate  therapy 07/10/2018   Abnormal laboratory test 03/29/2018   Sickle cell trait (HCC) 01/20/2017   JAK-2 gene mutation 12/06/2016   AKI (acute kidney injury) (HCC) 11/24/2016   Right shoulder pain 11/24/2016   Acute respiratory distress 11/24/2016   RLS (restless legs syndrome) 11/24/2016   Restless leg syndrome 10/13/2016   Primary osteoarthritis of left knee 02/18/2015   Shingles 12/19/2014   ILD (interstitial lung disease) (HCC)    Essential thrombocythemia (HCC) 12/24/2013   S/P left knee arthroscopy 11/11/2013   TIA (transient ischemic attack) 09/01/2012   Chest pain 09/01/2012   Arm numbness left 06/03/2012   Migraine 05/31/2012   Dyspnea on exertion 08/09/2011   DDD (degenerative disc disease), lumbar 07/20/2011   DJD (degenerative joint disease), lumbar 07/20/2011   Cerebral infarction (HCC) 07/20/2011   Syncope and collapse 07/12/2011   Acute lumbar radiculopathy 07/12/2011   Preoperative respiratory examination 07/10/2011   Trigger finger, acquired 01/11/2011   Other dysphagia 01/11/2011   MENOPAUSAL DISORDER 07/05/2010   Osteoarthritis 07/05/2010   NEVUS 05/20/2010   VAGINITIS 03/23/2010   SUPERFICIAL THROMBOPHLEBITIS 03/20/2009   FATIGUE 03/20/2009   DYSPHAGIA UNSPECIFIED 02/16/2009   Snoring 11/05/2008   HERPES ZOSTER 06/30/2008   Diabetes (HCC) 06/30/2008   Mixed hyperlipidemia 06/30/2008   ESOPHAGEAL STRICTURE 06/30/2008   FOOT PAIN, RIGHT 06/30/2008   OSTEOPOROSIS 06/30/2008   History of colonic polyps 06/30/2008   PANIC ATTACK 09/05/2007   Allergic rhinitis 09/05/2007   Vocal cord dysfunction 09/05/2007   Gastroesophageal reflux disease without esophagitis 09/05/2007   Past Medical History:  Diagnosis Date   Allergic rhinitis    Allergy  seasonal allergies   Anemia    hx of   Asthma    on meds   Cataract    bilateral sx    Chronic bronchitis  (HCC)    Colonic polyp    COPD (chronic obstructive pulmonary disease) (HCC)    DDD (degenerative disc disease), lumbar 07/20/2011   DJD (degenerative joint disease) of knee    bilateral   DJD (degenerative joint disease), lumbar 07/20/2011   Esophageal stricture    GERD (gastroesophageal reflux disease)    on meds   Hiatal hernia    Hyperlipidemia    on meds   Osteoporosis    Stroke [facility]) 2017   didn't know I'd had one before I was told; didn't do any damage (11/24/2016)   Vocal cord dysfunction     Past Surgical History:  Procedure Laterality Date   CARPAL TUNNEL RELEASE Bilateral    COLONOSCOPY  2017   JP-suprep(exc)=TA x 2   COLONOSCOPY W/ BIOPSIES AND POLYPECTOMY     ESOPHAGOGASTRODUODENOSCOPY N/A 02/03/2022   Procedure: ESOPHAGOGASTRODUODENOSCOPY (EGD);  Surgeon: Teressa Toribio SQUIBB, MD;  Location: [facility];  Service: Endoscopy;  Laterality: N/A;   ESOPHAGOGASTRODUODENOSCOPY (EGD) WITH ESOPHAGEAL DILATION     EUS N/A 02/03/2022   Procedure: UPPER ENDOSCOPIC ULTRASOUND (EUS) RADIAL;  Surgeon: Teressa Toribio SQUIBB, MD;  Location: [facility];  Service: Endoscopy;  Laterality: N/A;   FOOT SURGERY Bilateral    might have been bunions or hammertoes   HEMORROIDECTOMY  1972   KNEE ARTHROSCOPY Left 11/06/2013   POLYPECTOMY  2017   TA x 2   TONSILLECTOMY     TOTAL ABDOMINAL HYSTERECTOMY     UPPER GASTROINTESTINAL ENDOSCOPY  01/20/2022   VIDEO BRONCHOSCOPY Bilateral 12/12/2014   Procedure: VIDEO BRONCHOSCOPY WITHOUT FLUORO;  Surgeon: Carolynne Allan, MD;  Location: [facility];  Service: Cardiopulmonary;  Laterality: Bilateral;   WISDOM TOOTH EXTRACTION      Current Outpatient Medications  Medication Sig Dispense Refill Last Dose/Taking   acetaminophen  (TYLENOL ) 500 MG tablet Take 500-1,000 mg by mouth every 6 (six) hours as needed (pain.).      albuterol  (PROVENTIL  HFA) 108 (90 Base) MCG/ACT inhaler Inhale 1-2 puffs into the lungs every 6 (six) hours as needed for wheezing or  shortness of breath. 24 g 3    aspirin  EC 81 MG tablet Take 81 mg by mouth in the morning.      Budeson-Glycopyrrol-Formoterol  (BREZTRI  AEROSPHERE) 160-9-4.8 MCG/ACT AERO Inhale 2 puffs into the lungs in the morning and at bedtime. (Patient not taking: Reported on 07/24/2024)      budeson-glycopyrrolate -formoterol  (BREZTRI  AEROSPHERE) 160-9-4.8 MCG/ACT AERO inhaler 1 sample provided      CALCIUM -CHOLECALCIFEROL  PO Take 1 tablet by mouth in the morning.      cetirizine  (ZYRTEC  ALLERGY) 10 MG tablet Take 1 tablet (10 mg total) by mouth daily. 30 tablet 5    cholecalciferol  (VITAMIN D3) 25 MCG (1000 UNIT) tablet Take 1,000 Units by mouth daily.      ferrous sulfate  325 (65 FE) MG tablet Take 325 mg by mouth daily.      fluticasone  (FLONASE ) 50 MCG/ACT nasal spray Place 1 spray into the nose 2 (two) times daily.      folic acid  (FOLVITE ) 400 MCG tablet Take 400 mcg by mouth in the morning.      gabapentin  (NEURONTIN ) 100 MG capsule Take 100 mg by mouth 2 (two) times daily.      hydroxyurea  (HYDREA ) 500 MG capsule Take 1 capsule (500 mg total)  by mouth 3 (three) times daily before meals. (Patient taking differently: Take 500 mg by mouth 2 (two) times daily.) 90 capsule 6    ipratropium (ATROVENT  HFA) 17 MCG/ACT inhaler Inhale 2 puffs into the lungs every 6 (six) hours as needed for wheezing. (Patient not taking: Reported on 07/24/2024) 1 each 12    ipratropium (ATROVENT ) 0.02 % nebulizer solution Take 2.5 mLs (0.5 mg total) by nebulization every 6 (six) hours. (Patient taking differently: Take 0.5 mg by nebulization every 6 (six) hours as needed for wheezing or shortness of breath.) 75 mL 12    levalbuterol  (XOPENEX ) 0.63 MG/3ML nebulizer solution Take 3 mLs (0.63 mg total) by nebulization every 6 (six) hours. (Patient taking differently: Take 0.63 mg by nebulization every 8 (eight) hours as needed for wheezing or shortness of breath.) 3 mL 12    magnesium  oxide (MAG-OX) 400 MG tablet Take 400 mg by mouth in  the morning.      methotrexate  (RHEUMATREX) 2.5 MG tablet Take 2.5 mg by mouth every Monday.      montelukast  (SINGULAIR ) 10 MG tablet Take 1 tablet (10 mg total) by mouth at bedtime. 90 tablet 3    Multiple Vitamins-Minerals (CENTRUM SILVER 50+WOMEN PO) Take 1 tablet by mouth in the morning.      pantoprazole  (PROTONIX ) 20 MG tablet Take 1 tablet (20 mg total) by mouth 2 (two) times daily. 60 tablet 1    PARoxetine  (PAXIL ) 20 MG tablet Take 20 mg by mouth every morning.      rosuvastatin  (CRESTOR ) 10 MG tablet Take 10 mg by mouth at bedtime.      vitamin B-12 (CYANOCOBALAMIN ) 1000 MCG tablet Take 1,000 mcg by mouth in the morning and at bedtime.      No current facility-administered medications for this visit.   Allergies  Allergen Reactions   Statins Other (See Comments)    Leg cramps   Penicillins Itching and Rash    Did it involve swelling of the face/tongue/throat, SOB, or low BP? N Did it involve sudden or severe rash/hives, skin peeling, or any reaction on the inside of your mouth or nose? Y Did you need to seek medical attention at a hospital or doctor's office? N When did it last happen? Decades Ago      If all above answers are NO, may proceed with cephalosporin use.     Pravastatin  Other (See Comments)    leg cramps    Social History   Tobacco Use   Smoking status: Former    Current packs/day: 0.00    Average packs/day: 0.1 packs/day for 30.0 years (3.0 ttl pk-yrs)    Types: Cigarettes    Start date: 01/31/1975    Quit date: 08/09/1995    Years since quitting: 28.9   Smokeless tobacco: Never  Substance Use Topics   Alcohol use: Yes    Alcohol/week: 0.0 standard drinks of alcohol    Comment: once per 3 months    Family History  Problem Relation Age of Onset   Heart attack Mother    Colon cancer Father 45   Colon polyps Father 54   Colon polyps Sister 44   Colon cancer Sister 41   Asthma Brother    Asthma Other        uncle   Colon polyps Daughter 58    Stomach cancer Neg Hx    Esophageal cancer Neg Hx    Rectal cancer Neg Hx      Review of Systems  Musculoskeletal:  Positive for arthralgias, gait problem and joint swelling.  All other systems reviewed and are negative.   Objective:  Physical Exam Constitutional:      Appearance: Normal appearance.  HENT:     Head: Normocephalic and atraumatic.     Nose: Nose normal.     Mouth/Throat:     Mouth: Mucous membranes are moist.     Pharynx: Oropharynx is clear.  Eyes:     Conjunctiva/sclera: Conjunctivae normal.  Cardiovascular:     Rate and Rhythm: Normal rate and regular rhythm.     Pulses: Normal pulses.     Heart sounds: Normal heart sounds.  Pulmonary:     Effort: Pulmonary effort is normal.     Breath sounds: Normal breath sounds.  Abdominal:     General: Abdomen is flat.     Palpations: Abdomen is soft.  Genitourinary:    Comments: deferred Musculoskeletal:     Cervical back: Normal range of motion and neck supple.     Comments: Examination of the left knee reveals healed arthroscopic portals. No skin wounds or lesions. Mild swelling, no effusion noted. No warmth or erythema. Pain with palpation over the medial joint line, lateral joint line, periretinacular tissues, positive grind sign. Significant pain over the pes anserine insertion. Range of motion 0 to 130 degrees. No instability noted. Painless hip ROM.  Neurovasculary intact distally. No significant pedal edema.    Skin:    General: Skin is warm and dry.     Capillary Refill: Capillary refill takes less than 2 seconds.  Neurological:     General: No focal deficit present.     Mental Status: She is alert and oriented to person, place, and time.  Psychiatric:        Mood and Affect: Mood normal.        Behavior: Behavior normal.        Thought Content: Thought content normal.        Judgment: Judgment normal.     Vital signs in last 24 hours: @VSRANGES @  Labs:   Estimated body mass index is 23.59  kg/m as calculated from the following:   Height as of 07/26/24: 5' 2 (1.575 m).   Weight as of 07/26/24: 58.5 kg.   Imaging Review Plain radiographs demonstrate severe degenerative joint disease of the left knee(s). The overall alignment ismild varus. The bone quality appears to be adequate for age and reported activity level.      Assessment/Plan:  End stage arthritis, left knee   The patient history, physical examination, clinical judgment of the provider and imaging studies are consistent with end stage degenerative joint disease of the left knee(s) and total knee arthroplasty is deemed medically necessary. The treatment options including medical management, injection therapy arthroscopy and arthroplasty were discussed at length. The risks and benefits of total knee arthroplasty were presented and reviewed. The risks due to aseptic loosening, infection, stiffness, patella tracking problems, thromboembolic complications and other imponderables were discussed. The patient acknowledged the explanation, agreed to proceed with the plan and consent was signed. Patient is being admitted for inpatient treatment for surgery, pain control, PT, OT, prophylactic antibiotics, VTE prophylaxis, progressive ambulation and ADL's and discharge planning. The patient is planning to be discharged home with OPPT after an overnight stay.   Therapy Plans: outpatient therapy. [facility] 08/12/24.  Disposition: Home with daughter Planned DVT Prophylaxis: aspirin  81mg  BID DME needed: walker. Will consider Ice machine.  PCP: Cleared.  Cardiology: Cleared. Okay  to hold aspirin  5-7 days prior to surgery. Pulm: Use inhalers perioperatively.  TXA: IV Allergies:  - Penicillin - itching.  - Pravastatin  - leg cramping.  Anesthesia Concerns: None.  BMI: 24.8 Last HgbA1c: 5.3 Other: - JAK 2.  - Asthma, COPD.  - Aspirin  81mg  baseline.  - RA, methotrexate . Last dose 07/21/24. Hold until incision fully healed  after surgery.  - Staples** - Oxycodone , zofran .  - 07/26/24: K+ 4.1, Cr. 0.89, Hgb 10.4.      Patient's anticipated LOS is less than 2 midnights, meeting these requirements: - Younger than 28 - Lives within 1 hour of care - Has a competent adult at home to recover with post-op recover - NO history of  - Chronic pain requiring opiods  - Diabetes  - Coronary Artery Disease  - Heart failure  - Heart attack  - Stroke  - DVT/VTE  - Cardiac arrhythmia  - Respiratory Failure/COPD  - Renal failure  - Anemia  - Advanced Liver disease

## 2024-07-23 NOTE — H&P (View-Only) (Signed)
 TOTAL KNEE ADMISSION H&P  Patient is being admitted for left total knee arthroplasty.  Subjective:  Chief Complaint:left knee pain.  HPI: [name], 78 y.o. female, has a history of pain and functional disability in the left knee due to arthritis and has failed non-surgical conservative treatments for greater than 12 weeks to includeNSAID's and/or analgesics, corticosteriod injections, viscosupplementation injections, flexibility and strengthening excercises, use of assistive devices, and activity modification.  Onset of symptoms was gradual, starting >10 years ago with rapidlly worsening course since that time. The patient noted no past surgery on the left knee(s).  Patient currently rates pain in the left knee(s) at 10 out of 10 with activity. Patient has night pain, worsening of pain with activity and weight bearing, pain that interferes with activities of daily living, pain with passive range of motion, crepitus, and joint swelling.  Patient has evidence of subchondral cysts, subchondral sclerosis, periarticular osteophytes, and joint space narrowing by imaging studies. There is no active infection.  Patient Active Problem List   Diagnosis Date Noted   COPD (chronic obstructive pulmonary disease) (HCC) 02/06/2023   Abnormal gait 01/12/2022   Carpal tunnel syndrome 01/12/2022   Essential tremor 01/12/2022   Skin sensation disturbance 01/12/2022   History of stroke 09/04/2021   Candidiasis 09/01/2021   Chronic kidney disease, stage 2 (mild) 08/12/2021   Centrilobular emphysema (HCC) 04/13/2021   Parietoalveolar pneumopathy (HCC) 04/13/2021   Cramp and spasm 03/01/2021   Cramp in lower leg associated with rest 03/01/2021   Hardening of the aorta (main artery of the heart) (HCC) 03/01/2021   Leukopenia 03/01/2021   Menopause 03/01/2021   Multiple joint pain 03/01/2021   Obesity 03/01/2021   Rheumatoid arthritis (HCC) 03/01/2021   Severe major depression, single episode, without  psychotic features (HCC) 03/01/2021   Hyperglycemia 02/21/2021   Asthma 07/08/2020   On methotrexate  therapy 07/10/2018   Abnormal laboratory test 03/29/2018   Sickle cell trait (HCC) 01/20/2017   JAK-2 gene mutation 12/06/2016   AKI (acute kidney injury) (HCC) 11/24/2016   Right shoulder pain 11/24/2016   Acute respiratory distress 11/24/2016   RLS (restless legs syndrome) 11/24/2016   Restless leg syndrome 10/13/2016   Primary osteoarthritis of left knee 02/18/2015   Shingles 12/19/2014   ILD (interstitial lung disease) (HCC)    Essential thrombocythemia (HCC) 12/24/2013   S/P left knee arthroscopy 11/11/2013   TIA (transient ischemic attack) 09/01/2012   Chest pain 09/01/2012   Arm numbness left 06/03/2012   Migraine 05/31/2012   Dyspnea on exertion 08/09/2011   DDD (degenerative disc disease), lumbar 07/20/2011   DJD (degenerative joint disease), lumbar 07/20/2011   Cerebral infarction (HCC) 07/20/2011   Syncope and collapse 07/12/2011   Acute lumbar radiculopathy 07/12/2011   Preoperative respiratory examination 07/10/2011   Trigger finger, acquired 01/11/2011   Other dysphagia 01/11/2011   MENOPAUSAL DISORDER 07/05/2010   Osteoarthritis 07/05/2010   NEVUS 05/20/2010   VAGINITIS 03/23/2010   SUPERFICIAL THROMBOPHLEBITIS 03/20/2009   FATIGUE 03/20/2009   DYSPHAGIA UNSPECIFIED 02/16/2009   Snoring 11/05/2008   HERPES ZOSTER 06/30/2008   Diabetes (HCC) 06/30/2008   Mixed hyperlipidemia 06/30/2008   ESOPHAGEAL STRICTURE 06/30/2008   FOOT PAIN, RIGHT 06/30/2008   OSTEOPOROSIS 06/30/2008   History of colonic polyps 06/30/2008   PANIC ATTACK 09/05/2007   Allergic rhinitis 09/05/2007   Vocal cord dysfunction 09/05/2007   Gastroesophageal reflux disease without esophagitis 09/05/2007   Past Medical History:  Diagnosis Date   Allergic rhinitis    Allergy  seasonal allergies   Anemia    hx of   Asthma    on meds   Cataract    bilateral sx    Chronic bronchitis  (HCC)    Colonic polyp    COPD (chronic obstructive pulmonary disease) (HCC)    DDD (degenerative disc disease), lumbar 07/20/2011   DJD (degenerative joint disease) of knee    bilateral   DJD (degenerative joint disease), lumbar 07/20/2011   Esophageal stricture    GERD (gastroesophageal reflux disease)    on meds   Hiatal hernia    Hyperlipidemia    on meds   Osteoporosis    Stroke [facility]) 2017   didn't know I'd had one before I was told; didn't do any damage (11/24/2016)   Vocal cord dysfunction     Past Surgical History:  Procedure Laterality Date   CARPAL TUNNEL RELEASE Bilateral    COLONOSCOPY  2017   JP-suprep(exc)=TA x 2   COLONOSCOPY W/ BIOPSIES AND POLYPECTOMY     ESOPHAGOGASTRODUODENOSCOPY N/A 02/03/2022   Procedure: ESOPHAGOGASTRODUODENOSCOPY (EGD);  Surgeon: Teressa Toribio SQUIBB, MD;  Location: [facility];  Service: Endoscopy;  Laterality: N/A;   ESOPHAGOGASTRODUODENOSCOPY (EGD) WITH ESOPHAGEAL DILATION     EUS N/A 02/03/2022   Procedure: UPPER ENDOSCOPIC ULTRASOUND (EUS) RADIAL;  Surgeon: Teressa Toribio SQUIBB, MD;  Location: [facility];  Service: Endoscopy;  Laterality: N/A;   FOOT SURGERY Bilateral    might have been bunions or hammertoes   HEMORROIDECTOMY  1972   KNEE ARTHROSCOPY Left 11/06/2013   POLYPECTOMY  2017   TA x 2   TONSILLECTOMY     TOTAL ABDOMINAL HYSTERECTOMY     UPPER GASTROINTESTINAL ENDOSCOPY  01/20/2022   VIDEO BRONCHOSCOPY Bilateral 12/12/2014   Procedure: VIDEO BRONCHOSCOPY WITHOUT FLUORO;  Surgeon: Carolynne Allan, MD;  Location: [facility] ENDOSCOPY;  Service: Cardiopulmonary;  Laterality: Bilateral;   WISDOM TOOTH EXTRACTION      Current Outpatient Medications  Medication Sig Dispense Refill Last Dose/Taking   acetaminophen  (TYLENOL ) 500 MG tablet Take 500-1,000 mg by mouth every 6 (six) hours as needed (pain.).      albuterol  (PROVENTIL  HFA) 108 (90 Base) MCG/ACT inhaler Inhale 1-2 puffs into the lungs every 6 (six) hours as needed for wheezing or  shortness of breath. 24 g 3    aspirin  EC 81 MG tablet Take 81 mg by mouth in the morning.      Budeson-Glycopyrrol-Formoterol  (BREZTRI  AEROSPHERE) 160-9-4.8 MCG/ACT AERO Inhale 2 puffs into the lungs in the morning and at bedtime. (Patient not taking: Reported on 07/24/2024)      budeson-glycopyrrolate -formoterol  (BREZTRI  AEROSPHERE) 160-9-4.8 MCG/ACT AERO inhaler 1 sample provided      CALCIUM -CHOLECALCIFEROL  PO Take 1 tablet by mouth in the morning.      cetirizine  (ZYRTEC  ALLERGY) 10 MG tablet Take 1 tablet (10 mg total) by mouth daily. 30 tablet 5    cholecalciferol  (VITAMIN D3) 25 MCG (1000 UNIT) tablet Take 1,000 Units by mouth daily.      ferrous sulfate  325 (65 FE) MG tablet Take 325 mg by mouth daily.      fluticasone  (FLONASE ) 50 MCG/ACT nasal spray Place 1 spray into the nose 2 (two) times daily.      folic acid  (FOLVITE ) 400 MCG tablet Take 400 mcg by mouth in the morning.      gabapentin  (NEURONTIN ) 100 MG capsule Take 100 mg by mouth 2 (two) times daily.      hydroxyurea  (HYDREA ) 500 MG capsule Take 1 capsule (500 mg total)  by mouth 3 (three) times daily before meals. (Patient taking differently: Take 500 mg by mouth 2 (two) times daily.) 90 capsule 6    ipratropium (ATROVENT  HFA) 17 MCG/ACT inhaler Inhale 2 puffs into the lungs every 6 (six) hours as needed for wheezing. (Patient not taking: Reported on 07/24/2024) 1 each 12    ipratropium (ATROVENT ) 0.02 % nebulizer solution Take 2.5 mLs (0.5 mg total) by nebulization every 6 (six) hours. (Patient taking differently: Take 0.5 mg by nebulization every 6 (six) hours as needed for wheezing or shortness of breath.) 75 mL 12    levalbuterol  (XOPENEX ) 0.63 MG/3ML nebulizer solution Take 3 mLs (0.63 mg total) by nebulization every 6 (six) hours. (Patient taking differently: Take 0.63 mg by nebulization every 8 (eight) hours as needed for wheezing or shortness of breath.) 3 mL 12    magnesium  oxide (MAG-OX) 400 MG tablet Take 400 mg by mouth in  the morning.      methotrexate  (RHEUMATREX) 2.5 MG tablet Take 2.5 mg by mouth every Monday.      montelukast  (SINGULAIR ) 10 MG tablet Take 1 tablet (10 mg total) by mouth at bedtime. 90 tablet 3    Multiple Vitamins-Minerals (CENTRUM SILVER 50+WOMEN PO) Take 1 tablet by mouth in the morning.      pantoprazole  (PROTONIX ) 20 MG tablet Take 1 tablet (20 mg total) by mouth 2 (two) times daily. 60 tablet 1    PARoxetine  (PAXIL ) 20 MG tablet Take 20 mg by mouth every morning.      rosuvastatin  (CRESTOR ) 10 MG tablet Take 10 mg by mouth at bedtime.      vitamin B-12 (CYANOCOBALAMIN ) 1000 MCG tablet Take 1,000 mcg by mouth in the morning and at bedtime.      No current facility-administered medications for this visit.   Allergies  Allergen Reactions   Statins Other (See Comments)    Leg cramps   Penicillins Itching and Rash    Did it involve swelling of the face/tongue/throat, SOB, or low BP? N Did it involve sudden or severe rash/hives, skin peeling, or any reaction on the inside of your mouth or nose? Y Did you need to seek medical attention at a hospital or doctor's office? N When did it last happen? Decades Ago      If all above answers are NO, may proceed with cephalosporin use.     Pravastatin  Other (See Comments)    leg cramps    Social History   Tobacco Use   Smoking status: Former    Current packs/day: 0.00    Average packs/day: 0.1 packs/day for 30.0 years (3.0 ttl pk-yrs)    Types: Cigarettes    Start date: 01/31/1975    Quit date: 08/09/1995    Years since quitting: 28.9   Smokeless tobacco: Never  Substance Use Topics   Alcohol use: Yes    Alcohol/week: 0.0 standard drinks of alcohol    Comment: once per 3 months    Family History  Problem Relation Age of Onset   Heart attack Mother    Colon cancer Father 45   Colon polyps Father 54   Colon polyps Sister 44   Colon cancer Sister 41   Asthma Brother    Asthma Other        uncle   Colon polyps Daughter 58    Stomach cancer Neg Hx    Esophageal cancer Neg Hx    Rectal cancer Neg Hx      Review of Systems  Musculoskeletal:  Positive for arthralgias, gait problem and joint swelling.  All other systems reviewed and are negative.   Objective:  Physical Exam Constitutional:      Appearance: Normal appearance.  HENT:     Head: Normocephalic and atraumatic.     Nose: Nose normal.     Mouth/Throat:     Mouth: Mucous membranes are moist.     Pharynx: Oropharynx is clear.  Eyes:     Conjunctiva/sclera: Conjunctivae normal.  Cardiovascular:     Rate and Rhythm: Normal rate and regular rhythm.     Pulses: Normal pulses.     Heart sounds: Normal heart sounds.  Pulmonary:     Effort: Pulmonary effort is normal.     Breath sounds: Normal breath sounds.  Abdominal:     General: Abdomen is flat.     Palpations: Abdomen is soft.  Genitourinary:    Comments: deferred Musculoskeletal:     Cervical back: Normal range of motion and neck supple.     Comments: Examination of the left knee reveals healed arthroscopic portals. No skin wounds or lesions. Mild swelling, no effusion noted. No warmth or erythema. Pain with palpation over the medial joint line, lateral joint line, periretinacular tissues, positive grind sign. Significant pain over the pes anserine insertion. Range of motion 0 to 130 degrees. No instability noted. Painless hip ROM.  Neurovasculary intact distally. No significant pedal edema.    Skin:    General: Skin is warm and dry.     Capillary Refill: Capillary refill takes less than 2 seconds.  Neurological:     General: No focal deficit present.     Mental Status: She is alert and oriented to person, place, and time.  Psychiatric:        Mood and Affect: Mood normal.        Behavior: Behavior normal.        Thought Content: Thought content normal.        Judgment: Judgment normal.     Vital signs in last 24 hours: @VSRANGES @  Labs:   Estimated body mass index is 23.59  kg/m as calculated from the following:   Height as of 07/26/24: 5' 2 (1.575 m).   Weight as of 07/26/24: 58.5 kg.   Imaging Review Plain radiographs demonstrate severe degenerative joint disease of the left knee(s). The overall alignment ismild varus. The bone quality appears to be adequate for age and reported activity level.      Assessment/Plan:  End stage arthritis, left knee   The patient history, physical examination, clinical judgment of the provider and imaging studies are consistent with end stage degenerative joint disease of the left knee(s) and total knee arthroplasty is deemed medically necessary. The treatment options including medical management, injection therapy arthroscopy and arthroplasty were discussed at length. The risks and benefits of total knee arthroplasty were presented and reviewed. The risks due to aseptic loosening, infection, stiffness, patella tracking problems, thromboembolic complications and other imponderables were discussed. The patient acknowledged the explanation, agreed to proceed with the plan and consent was signed. Patient is being admitted for inpatient treatment for surgery, pain control, PT, OT, prophylactic antibiotics, VTE prophylaxis, progressive ambulation and ADL's and discharge planning. The patient is planning to be discharged home with OPPT after an overnight stay.   Therapy Plans: outpatient therapy. [facility] 08/12/24.  Disposition: Home with daughter Planned DVT Prophylaxis: aspirin  81mg  BID DME needed: walker. Will consider Ice machine.  PCP: Cleared.  Cardiology: Cleared. Okay  to hold aspirin  5-7 days prior to surgery. Pulm: Use inhalers perioperatively.  TXA: IV Allergies:  - Penicillin - itching.  - Pravastatin  - leg cramping.  Anesthesia Concerns: None.  BMI: 24.8 Last HgbA1c: 5.3 Other: - JAK 2.  - Asthma, COPD.  - Aspirin  81mg  baseline.  - RA, methotrexate . Last dose 07/21/24. Hold until incision fully healed  after surgery.  - Staples** - Oxycodone , zofran .  - 07/26/24: K+ 4.1, Cr. 0.89, Hgb 10.4.      Patient's anticipated LOS is less than 2 midnights, meeting these requirements: - Younger than 28 - Lives within 1 hour of care - Has a competent adult at home to recover with post-op recover - NO history of  - Chronic pain requiring opiods  - Diabetes  - Coronary Artery Disease  - Heart failure  - Heart attack  - Stroke  - DVT/VTE  - Cardiac arrhythmia  - Respiratory Failure/COPD  - Renal failure  - Anemia  - Advanced Liver disease

## 2024-07-23 NOTE — Patient Instructions (Signed)
 SURGICAL WAITING ROOM VISITATION  Patients having surgery or a procedure may have no more than 2 support people in the waiting area - these visitors may rotate.    Children under the age of 50 must have an adult with them who is not the patient.  Visitors with respiratory illnesses are discouraged from visiting and should remain at home.  If the patient needs to stay at the hospital during part of their recovery, the visitor guidelines for inpatient rooms apply. Pre-op nurse will coordinate an appropriate time for 1 support person to accompany patient in pre-op.  This support person may not rotate.    Please refer to the [facility] website for the visitor guidelines for Inpatients (after your surgery is over and you are in a regular room).       Your procedure is scheduled on: 08/07/2024     Report to [facility] Main Entrance    Report to admitting at  0900 AM   Call this number if you have problems the morning of surgery [phone]   Do not eat food :After Midnight.   After Midnight you may have the following liquids until __ 0830____ AM  DAY OF SURGERY  Water Non-Citrus Juices (without pulp, NO RED-Apple, White grape, White cranberry) Black Coffee (NO MILK/CREAM OR CREAMERS, sugar ok)  Clear Tea (NO MILK/CREAM OR CREAMERS, sugar ok) regular and decaf                             Plain Jell-O (NO RED)                                           Fruit ices (not with fruit pulp, NO RED)                                     Popsicles (NO RED)                                                               Sports drinks like Gatorade (NO RED)                      The day of surgery:  Drink ONE (1) Pre-Surgery Clear Ensure or G2 at  0830AM the morning of surgery. Drink in one sitting. Do not sip.  This drink was given to you during your hospital  pre-op appointment visit. Nothing else to drink after completing the  Pre-Surgery Clear Ensure or G2.          If you have  questions, please contact your surgeon's office.       Oral Hygiene is also important to reduce your risk of infection.                                    Remember - BRUSH YOUR TEETH THE MORNING OF SURGERY WITH YOUR REGULAR TOOTHPASTE  DENTURES WILL BE REMOVED PRIOR TO SURGERY PLEASE DO NOT APPLY Poly  grip OR ADHESIVES!!!   Do NOT smoke after Midnight   Stop all vitamins and herbal supplements 7 days before surgery.   Take these medicines the morning of surgery with A SIP OF WATER:   DO NOT TAKE ANY ORAL DIABETIC MEDICATIONS DAY OF YOUR SURGERY  Bring CPAP mask and tubing day of surgery.                              You may not have any metal on your body including hair pins, jewelry, and body piercing             Do not wear make-up, lotions, powders, perfumes/cologne, or deodorant  Do not wear nail polish including gel and S&S, artificial/acrylic nails, or any other type of covering on natural nails including finger and toenails. If you have artificial nails, gel coating, etc. that needs to be removed by a nail salon please have this removed prior to surgery or surgery may need to be canceled/ delayed if the surgeon/ anesthesia feels like they are unable to be safely monitored.   Do not shave  48 hours prior to surgery.               Men may shave face and neck.   Do not bring valuables to the hospital. [facility] IS NOT             RESPONSIBLE   FOR VALUABLES.   Contacts, glasses, dentures or bridgework may not be worn into surgery.   Bring small overnight bag day of surgery.   DO NOT BRING YOUR HOME MEDICATIONS TO THE HOSPITAL. PHARMACY WILL DISPENSE MEDICATIONS LISTED ON YOUR MEDICATION LIST TO YOU DURING YOUR ADMISSION IN THE HOSPITAL!    Patients discharged on the day of surgery will not be allowed to drive home.  Someone NEEDS to stay with you for the first 24 hours after anesthesia.   Special Instructions: Bring a copy of your healthcare power of attorney and  living will documents the day of surgery if you haven't scanned them before.              Please read over the following fact sheets you were given: IF YOU HAVE QUESTIONS ABOUT YOUR PRE-OP INSTRUCTIONS PLEASE CALL [phone].   If you received a COVID test during your pre-op visit  it is requested that you wear a mask when out in public, stay away from anyone that may not be feeling well and notify your surgeon if you develop symptoms. If you test positive for Covid or have been in contact with anyone that has tested positive in the last 10 days please notify you surgeon.      Pre-operative 5 CHG Bath Instructions   You can play a key role in reducing the risk of infection after surgery. Your skin needs to be as free of germs as possible. You can reduce the number of germs on your skin by washing with CHG (chlorhexidine gluconate) soap before surgery. CHG is an antiseptic soap that kills germs and continues to kill germs even after washing.   DO NOT use if you have an allergy to chlorhexidine/CHG or antibacterial soaps. If your skin becomes reddened or irritated, stop using the CHG and notify one of our RNs at [phone].   Please shower with the CHG soap starting 4 days before surgery using the following schedule:     Please keep in mind the following:  DO NOT shave, including legs and underarms, starting the day of your first shower.   You may shave your face at any point before/day of surgery.  Place clean sheets on your bed the day you start using CHG soap. Use a clean washcloth (not used since being washed) for each shower. DO NOT sleep with pets once you start using the CHG.   CHG Shower Instructions:  If you choose to wash your hair and private area, wash first with your normal shampoo/soap.  After you use shampoo/soap, rinse your hair and body thoroughly to remove shampoo/soap residue.  Turn the water OFF and apply about 3 tablespoons (45 ml) of CHG soap to a CLEAN washcloth.   Apply CHG soap ONLY FROM YOUR NECK DOWN TO YOUR TOES (washing for 3-5 minutes)  DO NOT use CHG soap on face, private areas, open wounds, or sores.  Pay special attention to the area where your surgery is being performed.  If you are having back surgery, having someone wash your back for you may be helpful. Wait 2 minutes after CHG soap is applied, then you may rinse off the CHG soap.  Pat dry with a clean towel  Put on clean clothes/pajamas   If you choose to wear lotion, please use ONLY the CHG-compatible lotions on the back of this paper.     Additional instructions for the day of surgery: DO NOT APPLY any lotions, deodorants, cologne, or perfumes.   Put on clean/comfortable clothes.  Brush your teeth.  Ask your nurse before applying any prescription medications to the skin.      CHG Compatible Lotions   Aveeno Moisturizing lotion  Cetaphil Moisturizing Cream  Cetaphil Moisturizing Lotion  Clairol Herbal Essence Moisturizing Lotion, Dry Skin  Clairol Herbal Essence Moisturizing Lotion, Extra Dry Skin  Clairol Herbal Essence Moisturizing Lotion, Normal Skin  Curel Age Defying Therapeutic Moisturizing Lotion with Alpha Hydroxy  Curel Extreme Care Body Lotion  Curel Soothing Hands Moisturizing Hand Lotion  Curel Therapeutic Moisturizing Cream, Fragrance-Free  Curel Therapeutic Moisturizing Lotion, Fragrance-Free  Curel Therapeutic Moisturizing Lotion, Original Formula  Eucerin Daily Replenishing Lotion  Eucerin Dry Skin Therapy Plus Alpha Hydroxy Crme  Eucerin Dry Skin Therapy Plus Alpha Hydroxy Lotion  Eucerin Original Crme  Eucerin Original Lotion  Eucerin Plus Crme Eucerin Plus Lotion  Eucerin TriLipid Replenishing Lotion  Keri Anti-Bacterial Hand Lotion  Keri Deep Conditioning Original Lotion Dry Skin Formula Softly Scented  Keri Deep Conditioning Original Lotion, Fragrance Free Sensitive Skin Formula  Keri Lotion Fast Absorbing Fragrance Free Sensitive Skin  Formula  Keri Lotion Fast Absorbing Softly Scented Dry Skin Formula  Keri Original Lotion  Keri Skin Renewal Lotion Keri Silky Smooth Lotion  Keri Silky Smooth Sensitive Skin Lotion  Nivea Body Creamy Conditioning Oil  Nivea Body Extra Enriched Teacher, adult education Moisturizing Lotion Nivea Crme  Nivea Skin Firming Lotion  NutraDerm 30 Skin Lotion  NutraDerm Skin Lotion  NutraDerm Therapeutic Skin Cream  NutraDerm Therapeutic Skin Lotion  ProShield Protective Hand Cream  Provon moisturizing lotion

## 2024-07-23 NOTE — Progress Notes (Signed)
 Anesthesia Review:  PCP: Cardiologist : Thukkani- prepo on 04/10/24  Pulm- DR Shelah- LOV 10/12/23   PPM/ ICD: Device Orders: Rep Notified:  Chest x-ray : CT Chest- 06/18/24  EKG : 04/10/24  Echo : 05/15/24  Stress test: 05/15/24  PFT- 10/12/23  Cardiac Cath :   Activity level:  Sleep Study/ CPAP : Fasting Blood Sugar :      / Checks Blood Sugar -- times a day:    Blood Thinner/ Instructions /Last Dose: ASA / Instructions/ Last Dose :

## 2024-07-26 ENCOUNTER — Other Ambulatory Visit: Payer: Self-pay

## 2024-07-26 ENCOUNTER — Encounter (HOSPITAL_COMMUNITY)
Admission: RE | Admit: 2024-07-26 | Discharge: 2024-07-26 | Disposition: A | Discharge status: Home / Self Care | Source: Ambulatory Visit | Attending: Orthopedic Surgery | Admitting: Orthopedic Surgery

## 2024-07-26 ENCOUNTER — Encounter (HOSPITAL_COMMUNITY): Payer: Self-pay

## 2024-07-26 VITALS — BP 113/66 | HR 83 | Temp 98.4°F | Resp 16 | Ht 62.0 in | Wt 129.0 lb

## 2024-07-26 DIAGNOSIS — J4489 Other specified chronic obstructive pulmonary disease: Secondary | ICD-10-CM | POA: Diagnosis not present

## 2024-07-26 DIAGNOSIS — D693 Immune thrombocytopenic purpura: Secondary | ICD-10-CM | POA: Diagnosis not present

## 2024-07-26 DIAGNOSIS — M1712 Unilateral primary osteoarthritis, left knee: Secondary | ICD-10-CM | POA: Diagnosis not present

## 2024-07-26 DIAGNOSIS — Z87891 Personal history of nicotine dependence: Secondary | ICD-10-CM | POA: Insufficient documentation

## 2024-07-26 DIAGNOSIS — Z01812 Encounter for preprocedural laboratory examination: Secondary | ICD-10-CM | POA: Diagnosis not present

## 2024-07-26 DIAGNOSIS — D573 Sickle-cell trait: Secondary | ICD-10-CM | POA: Diagnosis not present

## 2024-07-26 DIAGNOSIS — Z01818 Encounter for other preprocedural examination: Secondary | ICD-10-CM

## 2024-07-26 HISTORY — DX: Chronic obstructive pulmonary disease, unspecified: J44.9

## 2024-07-26 LAB — CBC
HCT: 34.3 % — ABNORMAL LOW (ref 36.0–46.0)
Hemoglobin: 10.4 g/dL — ABNORMAL LOW (ref 12.0–15.0)
MCH: 29.1 pg (ref 26.0–34.0)
MCHC: 30.3 g/dL (ref 30.0–36.0)
MCV: 95.8 fL (ref 80.0–100.0)
Platelets: 250 K/uL (ref 150–400)
RBC: 3.58 MIL/uL — ABNORMAL LOW (ref 3.87–5.11)
RDW: 19.3 % — ABNORMAL HIGH (ref 11.5–15.5)
WBC: 5.3 K/uL (ref 4.0–10.5)
nRBC: 0 % (ref 0.0–0.2)

## 2024-07-26 LAB — BASIC METABOLIC PANEL WITH GFR
Anion gap: 9 (ref 5–15)
BUN: 9 mg/dL (ref 8–23)
CO2: 23 mmol/L (ref 22–32)
Calcium: 9.1 mg/dL (ref 8.9–10.3)
Chloride: 107 mmol/L (ref 98–111)
Creatinine, Ser: 0.89 mg/dL (ref 0.44–1.00)
GFR, Estimated: >60 mL/min (ref >60)
Glucose, Bld: 144 mg/dL — ABNORMAL HIGH (ref 70–99)
Potassium: 4.1 mmol/L (ref 3.5–5.1)
Sodium: 139 mmol/L (ref 135–145)

## 2024-07-26 LAB — SURGICAL PCR SCREEN
MRSA, PCR: NEGATIVE
Staphylococcus aureus: NEGATIVE

## 2024-07-29 NOTE — Progress Notes (Signed)
 Anesthesia Chart Review   Case: [id number] Date/Time: 08/07/24 1122   Procedure: ARTHROPLASTY, KNEE, TOTAL, USING IMAGELESS COMPUTER-ASSISTED NAVIGATION (Left: Knee)   Anesthesia type: Spinal   Diagnosis: Primary osteoarthritis of left knee [M17.12]   Pre-op diagnosis: Left knee osteoarthritis   Location: [facility] ROOM 07 / [facility]   Surgeons: Fidel Rogue, MD       DISCUSSION:78 y.o. former smoker with h/o asthma, COPD, left knee OA scheduled for above procedure 08/07/2024 with Dr. Rogue Fidel.   Pt follows with heme/onc for essential thrombocytopenia, JAK2 positive, sickle cell trait.  Pt last seen 07/01/2024. Hydrea  reduced from TID to BID given worsening neutropenia and anemia.   Pt seen by cardiology 04/10/2024 for preoperative evaluation.  Stress test and Echo ordered. Per notes, As long as no new cardiac issues since clearance was given, can move forward with left total knee arthroplasty. She can hold asa for 5-7 days prior to surgery and restart when medically safe to do so.   Per stress test results comments from Dr. Wendel, Low risk stress and echo.  She is at low risk for perioperative CV complications during her orthopedic surgery.  Last seen by pulmonology 03/27/2024, She is being evaluated for knee surgery for severe osteoarthritis.  Her age and history of asthma put her at moderate to high risk for general anesthesia but do not preclude her from proceeding.  She is well compensated at this time.  Certainly should be mindful of the potential for upper airway irritation from endotracheal intubation for general anesthesia in her postoperative care.   We reviewed your medications and your breathing status today.  Your asthma and chronic throat irritation put you at moderately increased risk for surgery and general anesthesia.  There is no contraindication to having your surgery if the benefits outweigh these risks.  You should be maintained on your usual medications for allergies,  reflux and asthma when hospitalized.  Attention should be given to possible throat and upper airway irritation that can come from mechanical ventilation that goes along with general anesthesia.  VS: BP 113/66   Pulse 83   Temp 36.9 C (Oral)   Resp 16   Ht 5' 2 (1.575 m)   Wt 58.5 kg   SpO2 100%   BMI 23.59 kg/m   PROVIDERS: Verdia Lombard, MD is PCP    LABS: Labs reviewed: Acceptable for surgery. (all labs ordered are listed, but only abnormal results are displayed)  Labs Reviewed  CBC - Abnormal; Notable for the following components:      Result Value   RBC 3.58 (*)    Hemoglobin 10.4 (*)    HCT 34.3 (*)    RDW 19.3 (*)    All other components within normal limits  BASIC METABOLIC PANEL WITH GFR - Abnormal; Notable for the following components:   Glucose, Bld 144 (*)    All other components within normal limits  SURGICAL PCR SCREEN     IMAGES:   EKG:   CV: Myocardial Perfusion 05/15/24     The study is normal. The study is low risk.   No ST deviation was noted.   LV perfusion is normal. There is no evidence of ischemia. There is no evidence of infarction.   Left ventricular function is normal. Nuclear stress EF: 79%. The left ventricular ejection fraction is hyperdynamic (>65%). End diastolic cavity size is normal. End systolic cavity size is normal.   CT images were obtained for attenuation correction and were examined for the  presence of coronary calcium  when appropriate.   Coronary calcium  was absent on the attenuation correction CT images.  Aortic atherosclerosis is present.   Coronary calcium  was present on the attenuation correction CT images. Mild coronary calcifications were present. Coronary calcifications were present in the left anterior descending artery distribution(s).   Prior study available for comparison from 08/22/2017.  There are no changes.    Echo 05/15/24 1. Left ventricular ejection fraction, by estimation, is 60 to 65%. Left   ventricular ejection fraction by 3D volume is 61 %. The left ventricle has  normal function. The left ventricle has no regional wall motion  abnormalities. Left ventricular diastolic   parameters were normal. The average left ventricular global longitudinal  strain is -22.7 %. The global longitudinal strain is normal.   2. Right ventricular systolic function is normal. The right ventricular  size is normal. There is normal pulmonary artery systolic pressure.   3. The mitral valve is normal in structure. Mild mitral valve  regurgitation. No evidence of mitral stenosis.   4. The aortic valve is tricuspid. Aortic valve regurgitation is not  visualized. Aortic valve sclerosis/calcification is present, without any  evidence of aortic stenosis.   5. The inferior vena cava is normal in size with greater than 50%  respiratory variability, suggesting right atrial pressure of 3 mmHg.  Past Medical History:  Diagnosis Date   Allergic rhinitis    Allergy    seasonal allergies   Anemia    hx of   Asthma    on meds   Cataract    bilateral sx    Chronic bronchitis (HCC)    Colonic polyp    COPD (chronic obstructive pulmonary disease) (HCC)    DDD (degenerative disc disease), lumbar 07/20/2011   DJD (degenerative joint disease) of knee    bilateral   DJD (degenerative joint disease), lumbar 07/20/2011   Esophageal stricture    GERD (gastroesophageal reflux disease)    on meds   Hiatal hernia    Hyperlipidemia    on meds   Osteoporosis    Stroke [facility]) 2017   didn't know I'd had one before I was told; didn't do any damage (11/24/2016)   Vocal cord dysfunction     Past Surgical History:  Procedure Laterality Date   CARPAL TUNNEL RELEASE Bilateral    COLONOSCOPY  2017   JP-suprep(exc)=TA x 2   COLONOSCOPY W/ BIOPSIES AND POLYPECTOMY     ESOPHAGOGASTRODUODENOSCOPY N/A 02/03/2022   Procedure: ESOPHAGOGASTRODUODENOSCOPY (EGD);  Surgeon: Teressa Toribio SQUIBB, MD;  Location: [facility];   Service: Endoscopy;  Laterality: N/A;   ESOPHAGOGASTRODUODENOSCOPY (EGD) WITH ESOPHAGEAL DILATION     EUS N/A 02/03/2022   Procedure: UPPER ENDOSCOPIC ULTRASOUND (EUS) RADIAL;  Surgeon: Teressa Toribio SQUIBB, MD;  Location: [facility];  Service: Endoscopy;  Laterality: N/A;   FOOT SURGERY Bilateral    might have been bunions or hammertoes   HEMORROIDECTOMY  1972   KNEE ARTHROSCOPY Left 11/06/2013   POLYPECTOMY  2017   TA x 2   TONSILLECTOMY     TOTAL ABDOMINAL HYSTERECTOMY     UPPER GASTROINTESTINAL ENDOSCOPY  01/20/2022   VIDEO BRONCHOSCOPY Bilateral 12/12/2014   Procedure: VIDEO BRONCHOSCOPY WITHOUT FLUORO;  Surgeon: Carolynne Allan, MD;  Location: [facility];  Service: Cardiopulmonary;  Laterality: Bilateral;   WISDOM TOOTH EXTRACTION      MEDICATIONS:  acetaminophen  (TYLENOL ) 500 MG tablet   albuterol  (PROVENTIL  HFA) 108 (90 Base) MCG/ACT inhaler   aspirin  EC 81 MG tablet  Budeson-Glycopyrrol-Formoterol  (BREZTRI  AEROSPHERE) 160-9-4.8 MCG/ACT AERO   budeson-glycopyrrolate-formoterol  (BREZTRI  AEROSPHERE) 160-9-4.8 MCG/ACT AERO inhaler   CALCIUM -CHOLECALCIFEROL  PO   cetirizine  (ZYRTEC  ALLERGY) 10 MG tablet   cholecalciferol  (VITAMIN D3) 25 MCG (1000 UNIT) tablet   ferrous sulfate 325 (65 FE) MG tablet   fluticasone  (FLONASE ) 50 MCG/ACT nasal spray   folic acid  (FOLVITE ) 400 MCG tablet   gabapentin  (NEURONTIN ) 100 MG capsule   hydroxyurea  (HYDREA ) 500 MG capsule   ipratropium (ATROVENT  HFA) 17 MCG/ACT inhaler   ipratropium (ATROVENT ) 0.02 % nebulizer solution   levalbuterol  (XOPENEX ) 0.63 MG/3ML nebulizer solution   magnesium  oxide (MAG-OX) 400 MG tablet   methotrexate  (RHEUMATREX) 2.5 MG tablet   montelukast  (SINGULAIR ) 10 MG tablet   Multiple Vitamins-Minerals (CENTRUM SILVER 50+WOMEN PO)   pantoprazole  (PROTONIX ) 20 MG tablet   PARoxetine (PAXIL) 20 MG tablet   rosuvastatin  (CRESTOR ) 10 MG tablet   vitamin B-12 (CYANOCOBALAMIN ) 1000 MCG tablet   No current  facility-administered medications for this encounter.     Harlene Hoots Ward, PA-C [facility] [phone]

## 2024-07-31 ENCOUNTER — Inpatient Hospital Stay: Admitting: Medical Oncology

## 2024-07-31 ENCOUNTER — Inpatient Hospital Stay

## 2024-07-31 ENCOUNTER — Inpatient Hospital Stay: Attending: Hematology & Oncology

## 2024-08-01 ENCOUNTER — Telehealth: Payer: Self-pay | Admitting: Medical Oncology

## 2024-08-01 NOTE — Anesthesia Preprocedure Evaluation (Addendum)
 Anesthesia Evaluation  Patient identified by MRN, date of birth, ID band Patient awake    Reviewed: Allergy & Precautions, NPO status , Patient's Chart, lab work & pertinent test results  Airway Mallampati: II  TM Distance: >3 FB     Dental  (+) Dental Advisory Given, Edentulous Upper, Poor Dentition   Pulmonary asthma , COPD,  COPD inhaler, former smoker ILD   breath sounds clear to auscultation + decreased breath sounds      Cardiovascular negative cardio ROS Normal cardiovascular exam Rhythm:Regular Rate:Normal     Neuro/Psych  Headaches PSYCHIATRIC DISORDERS Anxiety Depression    Restless legs syndrome Benign essential tremor TIA Neuromuscular disease CVA, No Residual Symptoms    GI/Hepatic Neg liver ROS, hiatal hernia,GERD  Medicated,,Hx/o esophageal stricture   Endo/Other  diabetes, Well Controlled, Type 2, Oral Hypoglycemic Agents  Hyperlipidemia  Renal/GU Renal disease  negative genitourinary   Musculoskeletal  (+) Arthritis , Osteoarthritis and Rheumatoid disorders,  OA left knee DDD lumbar spine Osteoporosis   Abdominal   Peds  Hematology  (+) Blood dyscrasia, Sickle cell trait and anemia   Anesthesia Other Findings   Reproductive/Obstetrics                              Anesthesia Physical Anesthesia Plan  ASA: 3  Anesthesia Plan: Spinal   Post-op Pain Management: Regional block* and Minimal or no pain anticipated   Induction: Intravenous  PONV Risk Score and Plan: 3 and Treatment may vary due to age or medical condition and Propofol  infusion  Airway Management Planned: Natural Airway and Simple Face Mask  Additional Equipment: None  Intra-op Plan:   Post-operative Plan:   Informed Consent: I have reviewed the patients History and Physical, chart, labs and discussed the procedure including the risks, benefits and alternatives for the proposed anesthesia with the  patient or authorized representative who has indicated his/her understanding and acceptance.     Dental advisory given  Plan Discussed with: CRNA and Anesthesiologist  Anesthesia Plan Comments: (See PAT note 07/26/24)         Anesthesia Quick Evaluation

## 2024-08-01 NOTE — Telephone Encounter (Signed)
 Patient did not wish to r/s missed ov from 07/31/24 at this time due to a sx she is having next week and she isnt sure when she will be able to get around. She stated she will call back when she is ready to r/s.

## 2024-08-07 ENCOUNTER — Ambulatory Visit (HOSPITAL_COMMUNITY)
Admission: RE | Admit: 2024-08-07 | Discharge: 2024-08-08 | Disposition: A | Discharge status: Home / Self Care | Attending: Orthopedic Surgery | Admitting: Orthopedic Surgery

## 2024-08-07 ENCOUNTER — Encounter (HOSPITAL_COMMUNITY)
Admission: RE | Disposition: A | Discharge status: Home / Self Care | Payer: Self-pay | Source: Home / Self Care | Attending: Orthopedic Surgery

## 2024-08-07 ENCOUNTER — Other Ambulatory Visit: Payer: Self-pay

## 2024-08-07 ENCOUNTER — Ambulatory Visit (HOSPITAL_COMMUNITY): Payer: Self-pay | Admitting: Physician Assistant

## 2024-08-07 ENCOUNTER — Ambulatory Visit (HOSPITAL_BASED_OUTPATIENT_CLINIC_OR_DEPARTMENT_OTHER): Admitting: Certified Registered"

## 2024-08-07 ENCOUNTER — Ambulatory Visit (HOSPITAL_COMMUNITY)

## 2024-08-07 ENCOUNTER — Encounter (HOSPITAL_COMMUNITY): Payer: Self-pay | Admitting: Orthopedic Surgery

## 2024-08-07 DIAGNOSIS — Z87891 Personal history of nicotine dependence: Secondary | ICD-10-CM | POA: Insufficient documentation

## 2024-08-07 DIAGNOSIS — G2581 Restless legs syndrome: Secondary | ICD-10-CM | POA: Insufficient documentation

## 2024-08-07 DIAGNOSIS — K219 Gastro-esophageal reflux disease without esophagitis: Secondary | ICD-10-CM | POA: Diagnosis not present

## 2024-08-07 DIAGNOSIS — Z01818 Encounter for other preprocedural examination: Secondary | ICD-10-CM

## 2024-08-07 DIAGNOSIS — Z96652 Presence of left artificial knee joint: Secondary | ICD-10-CM

## 2024-08-07 DIAGNOSIS — Z8672 Personal history of thrombophlebitis: Secondary | ICD-10-CM | POA: Insufficient documentation

## 2024-08-07 DIAGNOSIS — E669 Obesity, unspecified: Secondary | ICD-10-CM | POA: Insufficient documentation

## 2024-08-07 DIAGNOSIS — F32A Depression, unspecified: Secondary | ICD-10-CM | POA: Diagnosis not present

## 2024-08-07 DIAGNOSIS — M51369 Other intervertebral disc degeneration, lumbar region without mention of lumbar back pain or lower extremity pain: Secondary | ICD-10-CM | POA: Insufficient documentation

## 2024-08-07 DIAGNOSIS — G8918 Other acute postprocedural pain: Secondary | ICD-10-CM | POA: Diagnosis not present

## 2024-08-07 DIAGNOSIS — G250 Essential tremor: Secondary | ICD-10-CM | POA: Diagnosis not present

## 2024-08-07 DIAGNOSIS — K222 Esophageal obstruction: Secondary | ICD-10-CM | POA: Insufficient documentation

## 2024-08-07 DIAGNOSIS — J432 Centrilobular emphysema: Secondary | ICD-10-CM | POA: Diagnosis not present

## 2024-08-07 DIAGNOSIS — D573 Sickle-cell trait: Secondary | ICD-10-CM | POA: Diagnosis not present

## 2024-08-07 DIAGNOSIS — Z7984 Long term (current) use of oral hypoglycemic drugs: Secondary | ICD-10-CM | POA: Diagnosis not present

## 2024-08-07 DIAGNOSIS — F419 Anxiety disorder, unspecified: Secondary | ICD-10-CM | POA: Diagnosis not present

## 2024-08-07 DIAGNOSIS — M810 Age-related osteoporosis without current pathological fracture: Secondary | ICD-10-CM | POA: Diagnosis not present

## 2024-08-07 DIAGNOSIS — Z79899 Other long term (current) drug therapy: Secondary | ICD-10-CM | POA: Diagnosis not present

## 2024-08-07 DIAGNOSIS — J449 Chronic obstructive pulmonary disease, unspecified: Secondary | ICD-10-CM | POA: Diagnosis not present

## 2024-08-07 DIAGNOSIS — M1712 Unilateral primary osteoarthritis, left knee: Secondary | ICD-10-CM | POA: Diagnosis not present

## 2024-08-07 DIAGNOSIS — J849 Interstitial pulmonary disease, unspecified: Secondary | ICD-10-CM | POA: Insufficient documentation

## 2024-08-07 DIAGNOSIS — Z6823 Body mass index (BMI) 23.0-23.9, adult: Secondary | ICD-10-CM | POA: Insufficient documentation

## 2024-08-07 DIAGNOSIS — E1122 Type 2 diabetes mellitus with diabetic chronic kidney disease: Secondary | ICD-10-CM | POA: Insufficient documentation

## 2024-08-07 DIAGNOSIS — E785 Hyperlipidemia, unspecified: Secondary | ICD-10-CM | POA: Diagnosis not present

## 2024-08-07 DIAGNOSIS — K449 Diaphragmatic hernia without obstruction or gangrene: Secondary | ICD-10-CM | POA: Insufficient documentation

## 2024-08-07 DIAGNOSIS — E782 Mixed hyperlipidemia: Secondary | ICD-10-CM | POA: Diagnosis not present

## 2024-08-07 DIAGNOSIS — N182 Chronic kidney disease, stage 2 (mild): Secondary | ICD-10-CM | POA: Insufficient documentation

## 2024-08-07 DIAGNOSIS — R519 Headache, unspecified: Secondary | ICD-10-CM | POA: Diagnosis not present

## 2024-08-07 DIAGNOSIS — M069 Rheumatoid arthritis, unspecified: Secondary | ICD-10-CM | POA: Insufficient documentation

## 2024-08-07 DIAGNOSIS — R609 Edema, unspecified: Secondary | ICD-10-CM | POA: Diagnosis not present

## 2024-08-07 HISTORY — PX: KNEE ARTHROPLASTY: SHX992

## 2024-08-07 LAB — GLUCOSE, CAPILLARY: Glucose-Capillary: 75 mg/dL (ref 70–99)

## 2024-08-07 SURGERY — ARTHROPLASTY, KNEE, TOTAL, USING IMAGELESS COMPUTER-ASSISTED NAVIGATION
Anesthesia: Spinal | Site: Knee | Laterality: Left

## 2024-08-07 MED ORDER — HYDROMORPHONE HCL 1 MG/ML IJ SOLN: 0.2500 mg | INTRAMUSCULAR | Status: DC | PRN

## 2024-08-07 MED ORDER — ALUM & MAG HYDROXIDE-SIMETH 200-200-20 MG/5ML PO SUSP: 30.0000 mL | ORAL | Status: DC | PRN

## 2024-08-07 MED ORDER — HYDROXYUREA 500 MG PO CAPS: 500.0000 mg | ORAL_CAPSULE | Freq: Two times a day (BID) | ORAL | Status: DC

## 2024-08-07 MED ORDER — VITAMIN B-12 1000 MCG PO TABS
1000.0000 ug | ORAL_TABLET | Freq: Two times a day (BID) | ORAL | Status: DC
  Administered 2024-08-07 – 2024-08-08 (×2): 1000 ug via ORAL
  Filled 2024-08-07 (×2): qty 1

## 2024-08-07 MED ORDER — ORAL CARE MOUTH RINSE: 15.0000 mL | Freq: Once | OROMUCOSAL | Status: AC

## 2024-08-07 MED ORDER — KETOROLAC TROMETHAMINE 30 MG/ML IJ SOLN
INTRAMUSCULAR | Status: AC
  Filled 2024-08-07: qty 1

## 2024-08-07 MED ORDER — FENTANYL CITRATE (PF) 100 MCG/2ML IJ SOLN
INTRAMUSCULAR | Status: AC
  Filled 2024-08-07: qty 2

## 2024-08-07 MED ORDER — FENTANYL CITRATE (PF) 250 MCG/5ML IJ SOLN
INTRAMUSCULAR | Status: DC | PRN
  Administered 2024-08-07 (×2): 50 ug via INTRAVENOUS

## 2024-08-07 MED ORDER — FAMOTIDINE IN NACL 20-0.9 MG/50ML-% IV SOLN
20.0000 mg | Freq: Once | INTRAVENOUS | Status: AC
  Administered 2024-08-07: 20 mg via INTRAVENOUS
  Filled 2024-08-07 (×2): qty 50

## 2024-08-07 MED ORDER — PROPOFOL 10 MG/ML IV BOLUS
INTRAVENOUS | Status: DC | PRN
  Administered 2024-08-07: 30 mg via INTRAVENOUS

## 2024-08-07 MED ORDER — PHENYLEPHRINE 80 MCG/ML (10ML) SYRINGE FOR IV PUSH (FOR BLOOD PRESSURE SUPPORT)
PREFILLED_SYRINGE | INTRAVENOUS | Status: AC
  Filled 2024-08-07: qty 10

## 2024-08-07 MED ORDER — SODIUM CHLORIDE 0.9 % IV SOLN: INTRAVENOUS | Status: DC

## 2024-08-07 MED ORDER — IPRATROPIUM-ALBUTEROL 0.5-2.5 (3) MG/3ML IN SOLN
RESPIRATORY_TRACT | Status: AC
  Filled 2024-08-07: qty 3

## 2024-08-07 MED ORDER — SODIUM CHLORIDE (PF) 0.9 % IJ SOLN
INTRAMUSCULAR | Status: AC
  Filled 2024-08-07: qty 50

## 2024-08-07 MED ORDER — POVIDONE-IODINE 10 % EX SWAB: 2.0000 | Freq: Once | CUTANEOUS | Status: DC

## 2024-08-07 MED ORDER — EPHEDRINE 5 MG/ML INJ
INTRAVENOUS | Status: AC
Start: 2024-08-07 — End: 2024-08-07
  Filled 2024-08-07: qty 5

## 2024-08-07 MED ORDER — SENNA 8.6 MG PO TABS
1.0000 | ORAL_TABLET | Freq: Two times a day (BID) | ORAL | Status: DC
  Administered 2024-08-08: 8.6 mg via ORAL
  Filled 2024-08-07: qty 1

## 2024-08-07 MED ORDER — LACTATED RINGERS IV SOLN: INTRAVENOUS | Status: DC

## 2024-08-07 MED ORDER — SODIUM CHLORIDE 0.9 % IR SOLN
Status: DC | PRN
  Administered 2024-08-07: 250 mL
  Administered 2024-08-07: 3000 mL
  Administered 2024-08-07: 1000 mL

## 2024-08-07 MED ORDER — FOLIC ACID 1 MG PO TABS
500.0000 ug | ORAL_TABLET | Freq: Every day | ORAL | Status: DC
  Administered 2024-08-08: 0.5 mg via ORAL
  Filled 2024-08-07: qty 1

## 2024-08-07 MED ORDER — PHENYLEPHRINE 80 MCG/ML (10ML) SYRINGE FOR IV PUSH (FOR BLOOD PRESSURE SUPPORT)
PREFILLED_SYRINGE | INTRAVENOUS | Status: AC
  Filled 2024-08-07: qty 20

## 2024-08-07 MED ORDER — OXYCODONE HCL 5 MG PO TABS
5.0000 mg | ORAL_TABLET | ORAL | Status: DC | PRN
  Administered 2024-08-07 – 2024-08-08 (×2): 5 mg via ORAL
  Administered 2024-08-08: 10 mg via ORAL
  Filled 2024-08-07: qty 2
  Filled 2024-08-07 (×3): qty 1

## 2024-08-07 MED ORDER — PRONTOSAN WOUND IRRIGATION OPTIME
TOPICAL | Status: DC | PRN
Start: 2024-08-07 — End: 2024-08-07
  Administered 2024-08-07: 1 via TOPICAL

## 2024-08-07 MED ORDER — IPRATROPIUM-ALBUTEROL 0.5-2.5 (3) MG/3ML IN SOLN
3.0000 mL | RESPIRATORY_TRACT | Status: DC
  Administered 2024-08-07: 3 mL via RESPIRATORY_TRACT

## 2024-08-07 MED ORDER — DIPHENHYDRAMINE HCL 50 MG/ML IJ SOLN
12.5000 mg | Freq: Once | INTRAMUSCULAR | Status: AC
  Administered 2024-08-07: 12.5 mg via INTRAVENOUS

## 2024-08-07 MED ORDER — PHENOL 1.4 % MT LIQD
1.0000 | OROMUCOSAL | Status: DC | PRN
Start: 2024-08-07 — End: 2024-08-08

## 2024-08-07 MED ORDER — METHOCARBAMOL 500 MG PO TABS
500.0000 mg | ORAL_TABLET | Freq: Four times a day (QID) | ORAL | Status: DC | PRN
  Administered 2024-08-07 – 2024-08-08 (×2): 500 mg via ORAL
  Filled 2024-08-07 (×2): qty 1

## 2024-08-07 MED ORDER — MONTELUKAST SODIUM 10 MG PO TABS
10.0000 mg | ORAL_TABLET | Freq: Every day | ORAL | Status: DC
  Administered 2024-08-07: 10 mg via ORAL
  Filled 2024-08-07: qty 1

## 2024-08-07 MED ORDER — PHENYLEPHRINE 80 MCG/ML (10ML) SYRINGE FOR IV PUSH (FOR BLOOD PRESSURE SUPPORT)
PREFILLED_SYRINGE | INTRAVENOUS | Status: DC | PRN
  Administered 2024-08-07 (×3): 80 ug via INTRAVENOUS

## 2024-08-07 MED ORDER — METHOCARBAMOL 1000 MG/10ML IJ SOLN: 500.0000 mg | Freq: Four times a day (QID) | INTRAMUSCULAR | Status: DC | PRN

## 2024-08-07 MED ORDER — OXYCODONE HCL 5 MG PO TABS: 5.0000 mg | ORAL_TABLET | Freq: Once | ORAL | Status: DC | PRN

## 2024-08-07 MED ORDER — ONDANSETRON HCL 4 MG PO TABS
4.0000 mg | ORAL_TABLET | Freq: Four times a day (QID) | ORAL | Status: DC | PRN
  Administered 2024-08-08: 4 mg via ORAL
  Filled 2024-08-07: qty 1

## 2024-08-07 MED ORDER — ACETAMINOPHEN 500 MG PO TABS
1000.0000 mg | ORAL_TABLET | Freq: Four times a day (QID) | ORAL | Status: AC
Start: 2024-08-07 — End: 2024-08-08
  Administered 2024-08-07 – 2024-08-08 (×4): 1000 mg via ORAL
  Filled 2024-08-07 (×4): qty 2

## 2024-08-07 MED ORDER — CEFAZOLIN SODIUM-DEXTROSE 2-4 GM/100ML-% IV SOLN
2.0000 g | INTRAVENOUS | Status: AC
  Administered 2024-08-07: 2 g via INTRAVENOUS
  Filled 2024-08-07: qty 100

## 2024-08-07 MED ORDER — ROPIVACAINE HCL 5 MG/ML IJ SOLN
INTRAMUSCULAR | Status: DC | PRN
  Administered 2024-08-07: 30 mL via PERINEURAL

## 2024-08-07 MED ORDER — MENTHOL 3 MG MT LOZG: 1.0000 | LOZENGE | OROMUCOSAL | Status: DC | PRN

## 2024-08-07 MED ORDER — ACETAMINOPHEN 500 MG PO TABS
1000.0000 mg | ORAL_TABLET | Freq: Once | ORAL | Status: AC
  Administered 2024-08-07: 1000 mg via ORAL
  Filled 2024-08-07: qty 2

## 2024-08-07 MED ORDER — ONDANSETRON HCL 4 MG/2ML IJ SOLN: 4.0000 mg | Freq: Once | INTRAMUSCULAR | Status: DC | PRN

## 2024-08-07 MED ORDER — PAROXETINE HCL 10 MG PO TABS
20.0000 mg | ORAL_TABLET | Freq: Every morning | ORAL | Status: DC
  Administered 2024-08-08: 20 mg via ORAL
  Filled 2024-08-07: qty 2

## 2024-08-07 MED ORDER — FERROUS SULFATE 325 (65 FE) MG PO TABS
325.0000 mg | ORAL_TABLET | Freq: Every day | ORAL | Status: DC
  Administered 2024-08-08: 325 mg via ORAL
  Filled 2024-08-07: qty 1

## 2024-08-07 MED ORDER — OXYCODONE HCL 5 MG PO TABS: 10.0000 mg | ORAL_TABLET | ORAL | Status: DC | PRN

## 2024-08-07 MED ORDER — ONDANSETRON HCL 4 MG/2ML IJ SOLN
4.0000 mg | Freq: Four times a day (QID) | INTRAMUSCULAR | Status: DC | PRN
  Administered 2024-08-07: 4 mg via INTRAVENOUS
  Filled 2024-08-07: qty 2

## 2024-08-07 MED ORDER — KETOROLAC TROMETHAMINE 15 MG/ML IJ SOLN
7.5000 mg | Freq: Four times a day (QID) | INTRAMUSCULAR | Status: AC
  Administered 2024-08-07 – 2024-08-08 (×4): 7.5 mg via INTRAVENOUS
  Filled 2024-08-07 (×4): qty 1

## 2024-08-07 MED ORDER — VANCOMYCIN HCL 1000 MG IV SOLR
INTRAVENOUS | Status: DC | PRN
  Administered 2024-08-07: 1000 mg via TOPICAL

## 2024-08-07 MED ORDER — MIDAZOLAM HCL 2 MG/2ML IJ SOLN
2.0000 mg | INTRAMUSCULAR | Status: DC
  Administered 2024-08-07: 1 mg via INTRAVENOUS
  Filled 2024-08-07: qty 2

## 2024-08-07 MED ORDER — LEVALBUTEROL HCL 0.63 MG/3ML IN NEBU: 0.6300 mg | INHALATION_SOLUTION | Freq: Three times a day (TID) | RESPIRATORY_TRACT | Status: DC | PRN

## 2024-08-07 MED ORDER — AMISULPRIDE (ANTIEMETIC) 5 MG/2ML IV SOLN: 10.0000 mg | Freq: Once | INTRAVENOUS | Status: DC | PRN

## 2024-08-07 MED ORDER — GABAPENTIN 100 MG PO CAPS
100.0000 mg | ORAL_CAPSULE | Freq: Two times a day (BID) | ORAL | Status: DC
  Administered 2024-08-07 – 2024-08-08 (×2): 100 mg via ORAL
  Filled 2024-08-07 (×2): qty 1

## 2024-08-07 MED ORDER — FLUTICASONE PROPIONATE 50 MCG/ACT NA SUSP
1.0000 | Freq: Two times a day (BID) | NASAL | Status: DC
  Administered 2024-08-07 – 2024-08-08 (×2): 1 via NASAL
  Filled 2024-08-07: qty 16

## 2024-08-07 MED ORDER — KETOROLAC TROMETHAMINE 30 MG/ML IJ SOLN
INTRAMUSCULAR | Status: DC | PRN
  Administered 2024-08-07: 61 mL

## 2024-08-07 MED ORDER — ROSUVASTATIN CALCIUM 10 MG PO TABS
10.0000 mg | ORAL_TABLET | Freq: Every day | ORAL | Status: DC
  Administered 2024-08-07: 10 mg via ORAL
  Filled 2024-08-07: qty 1

## 2024-08-07 MED ORDER — BISACODYL 10 MG RE SUPP: 10.0000 mg | Freq: Every day | RECTAL | Status: DC | PRN

## 2024-08-07 MED ORDER — ACETAMINOPHEN 325 MG PO TABS: 325.0000 mg | ORAL_TABLET | Freq: Four times a day (QID) | ORAL | Status: DC | PRN

## 2024-08-07 MED ORDER — BUPIVACAINE-EPINEPHRINE (PF) 0.25% -1:200000 IJ SOLN
INTRAMUSCULAR | Status: AC
  Filled 2024-08-07: qty 30

## 2024-08-07 MED ORDER — IPRATROPIUM BROMIDE HFA 17 MCG/ACT IN AERS: 2.0000 | INHALATION_SPRAY | Freq: Four times a day (QID) | RESPIRATORY_TRACT | Status: DC | PRN

## 2024-08-07 MED ORDER — HYDROMORPHONE HCL 1 MG/ML IJ SOLN
0.5000 mg | INTRAMUSCULAR | Status: DC | PRN
  Administered 2024-08-07: 0.5 mg via INTRAVENOUS
  Filled 2024-08-07: qty 1

## 2024-08-07 MED ORDER — VANCOMYCIN HCL 1000 MG IV SOLR
INTRAVENOUS | Status: AC
  Filled 2024-08-07: qty 20

## 2024-08-07 MED ORDER — PROPOFOL 1000 MG/100ML IV EMUL
INTRAVENOUS | Status: AC
  Filled 2024-08-07: qty 300

## 2024-08-07 MED ORDER — POLYETHYLENE GLYCOL 3350 17 G PO PACK: 17.0000 g | PACK | Freq: Every day | ORAL | Status: DC | PRN

## 2024-08-07 MED ORDER — TRANEXAMIC ACID-NACL 1000-0.7 MG/100ML-% IV SOLN
1000.0000 mg | INTRAVENOUS | Status: AC
  Administered 2024-08-07: 1000 mg via INTRAVENOUS
  Filled 2024-08-07: qty 100

## 2024-08-07 MED ORDER — LORATADINE 10 MG PO TABS
10.0000 mg | ORAL_TABLET | Freq: Every day | ORAL | Status: DC
  Administered 2024-08-07 – 2024-08-08 (×2): 10 mg via ORAL
  Filled 2024-08-07 (×2): qty 1

## 2024-08-07 MED ORDER — CHLORHEXIDINE GLUCONATE 0.12 % MT SOLN
15.0000 mL | Freq: Once | OROMUCOSAL | Status: AC
  Administered 2024-08-07: 15 mL via OROMUCOSAL

## 2024-08-07 MED ORDER — BUPIVACAINE IN DEXTROSE 0.75-8.25 % IT SOLN
INTRATHECAL | Status: DC | PRN
  Administered 2024-08-07: 1.8 mL via INTRATHECAL

## 2024-08-07 MED ORDER — PROPOFOL 500 MG/50ML IV EMUL
INTRAVENOUS | Status: DC | PRN
  Administered 2024-08-07: 50 ug/kg/min via INTRAVENOUS

## 2024-08-07 MED ORDER — ASPIRIN 81 MG PO CHEW
81.0000 mg | CHEWABLE_TABLET | Freq: Two times a day (BID) | ORAL | Status: DC
  Administered 2024-08-07 – 2024-08-08 (×2): 81 mg via ORAL
  Filled 2024-08-07 (×2): qty 1

## 2024-08-07 MED ORDER — MAGNESIUM OXIDE -MG SUPPLEMENT 400 (240 MG) MG PO TABS
400.0000 mg | ORAL_TABLET | Freq: Every day | ORAL | Status: DC
  Administered 2024-08-08: 400 mg via ORAL
  Filled 2024-08-07: qty 1

## 2024-08-07 MED ORDER — BUDESON-GLYCOPYRROL-FORMOTEROL 160-9-4.8 MCG/ACT IN AERO
2.0000 | INHALATION_SPRAY | Freq: Two times a day (BID) | RESPIRATORY_TRACT | Status: DC
  Administered 2024-08-07 – 2024-08-08 (×2): 2 via RESPIRATORY_TRACT
  Filled 2024-08-07: qty 5.9

## 2024-08-07 MED ORDER — GLYCOPYRROLATE 0.2 MG/ML IJ SOLN
INTRAMUSCULAR | Status: DC | PRN
  Administered 2024-08-07: .1 mg via INTRAVENOUS

## 2024-08-07 MED ORDER — DIPHENHYDRAMINE HCL 50 MG/ML IJ SOLN
INTRAMUSCULAR | Status: AC
  Filled 2024-08-07: qty 1

## 2024-08-07 MED ORDER — FENTANYL CITRATE PF 50 MCG/ML IJ SOSY
100.0000 ug | PREFILLED_SYRINGE | INTRAMUSCULAR | Status: DC
  Administered 2024-08-07: 50 ug via INTRAVENOUS
  Filled 2024-08-07: qty 2

## 2024-08-07 MED ORDER — ALBUTEROL SULFATE (2.5 MG/3ML) 0.083% IN NEBU: 3.0000 mL | INHALATION_SOLUTION | Freq: Four times a day (QID) | RESPIRATORY_TRACT | Status: DC | PRN

## 2024-08-07 MED ORDER — OXYCODONE HCL 5 MG/5ML PO SOLN: 5.0000 mg | Freq: Once | ORAL | Status: DC | PRN

## 2024-08-07 MED ORDER — CEFAZOLIN SODIUM-DEXTROSE 2-4 GM/100ML-% IV SOLN
2.0000 g | Freq: Four times a day (QID) | INTRAVENOUS | Status: AC
  Administered 2024-08-07 – 2024-08-08 (×2): 2 g via INTRAVENOUS
  Filled 2024-08-07 (×2): qty 100

## 2024-08-07 MED ORDER — IPRATROPIUM BROMIDE 0.02 % IN SOLN: 0.5000 mg | Freq: Four times a day (QID) | RESPIRATORY_TRACT | Status: DC | PRN

## 2024-08-07 MED ORDER — DOCUSATE SODIUM 100 MG PO CAPS
100.0000 mg | ORAL_CAPSULE | Freq: Two times a day (BID) | ORAL | Status: DC
  Administered 2024-08-07 – 2024-08-08 (×2): 100 mg via ORAL
  Filled 2024-08-07 (×2): qty 1

## 2024-08-07 MED ORDER — METOCLOPRAMIDE HCL 5 MG/ML IJ SOLN: 5.0000 mg | Freq: Three times a day (TID) | INTRAMUSCULAR | Status: DC | PRN

## 2024-08-07 MED ORDER — DIPHENHYDRAMINE HCL 12.5 MG/5ML PO ELIX: 12.5000 mg | ORAL_SOLUTION | ORAL | Status: DC | PRN

## 2024-08-07 MED ORDER — PANTOPRAZOLE SODIUM 40 MG PO TBEC
40.0000 mg | DELAYED_RELEASE_TABLET | Freq: Every day | ORAL | Status: DC
  Administered 2024-08-07 – 2024-08-08 (×2): 40 mg via ORAL
  Filled 2024-08-07 (×2): qty 1

## 2024-08-07 MED ORDER — METOCLOPRAMIDE HCL 5 MG PO TABS: 5.0000 mg | ORAL_TABLET | Freq: Three times a day (TID) | ORAL | Status: DC | PRN

## 2024-08-07 SURGICAL SUPPLY — 61 items
BAG COUNTER SPONGE SURGICOUNT (BAG) IMPLANT
BAG ZIPLOCK 12X15 (MISCELLANEOUS) ×1 IMPLANT
BATTERY INSTRU NAVIGATION (MISCELLANEOUS) ×3 IMPLANT
BLADE SAW RECIPROCATING 77.5 (BLADE) IMPLANT
BLADE SAW SAG 35X64 .89 (BLADE) IMPLANT
BNDG ELASTIC 4INX 5YD STR LF (GAUZE/BANDAGES/DRESSINGS) ×1 IMPLANT
BNDG ELASTIC 6INX 5YD STR LF (GAUZE/BANDAGES/DRESSINGS) ×1 IMPLANT
CHLORAPREP W/TINT 26 (MISCELLANEOUS) ×2 IMPLANT
COMPONENT FEM KNEE PS STD 6 LT (Joint) IMPLANT
COMPONENT PATELLA PEG 3 32 (Joint) IMPLANT
COOLER ICEMAN CLASSIC (MISCELLANEOUS) IMPLANT
COVER SURGICAL LIGHT HANDLE (MISCELLANEOUS) ×1 IMPLANT
DERMABOND ADVANCED .7 DNX12 (GAUZE/BANDAGES/DRESSINGS) ×2 IMPLANT
DRAPE SHEET LG 3/4 BI-LAMINATE (DRAPES) ×3 IMPLANT
DRAPE U-SHAPE 47X51 STRL (DRAPES) ×1 IMPLANT
DRSG AQUACEL AG ADV 3.5X10 (GAUZE/BANDAGES/DRESSINGS) ×1 IMPLANT
ELECT BLADE TIP CTD 4 INCH (ELECTRODE) ×1 IMPLANT
ELECT PENCIL ROCKER SW 15FT (MISCELLANEOUS) ×1 IMPLANT
ELECT REM PT RETURN 15FT ADLT (MISCELLANEOUS) ×1 IMPLANT
GAUZE SPONGE 4X4 12PLY STRL (GAUZE/BANDAGES/DRESSINGS) ×1 IMPLANT
GLOVE BIO SURGEON STRL SZ7 (GLOVE) ×1 IMPLANT
GLOVE BIO SURGEON STRL SZ8.5 (GLOVE) ×2 IMPLANT
GLOVE BIOGEL PI IND STRL 7.5 (GLOVE) ×1 IMPLANT
GLOVE BIOGEL PI IND STRL 8.5 (GLOVE) ×1 IMPLANT
GOWN SPEC L3 XXLG W/TWL (GOWN DISPOSABLE) ×1 IMPLANT
GOWN STRL REUS W/ TWL XL LVL3 (GOWN DISPOSABLE) ×1 IMPLANT
HOLDER FOLEY CATH W/STRAP (MISCELLANEOUS) ×1 IMPLANT
HOOD PEEL AWAY T7 (MISCELLANEOUS) ×3 IMPLANT
KIT TURNOVER KIT A (KITS) ×1 IMPLANT
LINER TIB FB EF/3-11 11 LT (Liner) IMPLANT
MARKER SKIN DUAL TIP RULER LAB (MISCELLANEOUS) ×1 IMPLANT
NDL SAFETY ECLIPSE 18X1.5 (NEEDLE) ×1 IMPLANT
NDL SPNL 18GX3.5 QUINCKE PK (NEEDLE) ×1 IMPLANT
NEEDLE SPNL 18GX3.5 QUINCKE PK (NEEDLE) ×1 IMPLANT
NS IRRIG 1000ML POUR BTL (IV SOLUTION) ×1 IMPLANT
PACK TOTAL KNEE CUSTOM (KITS) ×1 IMPLANT
PAD COLD SHLDR WRAP-ON (PAD) IMPLANT
PADDING CAST COTTON 6X4 STRL (CAST SUPPLIES) ×1 IMPLANT
PIN DRILL HDLS TROCAR 75 4PK (PIN) IMPLANT
PROTECTOR NERVE ULNAR (MISCELLANEOUS) ×1 IMPLANT
SAW OSC TIP CART 19.5X105X1.3 (SAW) IMPLANT
SCREW FEMALE HEX FIX 25X2.5 (ORTHOPEDIC DISPOSABLE SUPPLIES) IMPLANT
SEALER BIPOLAR AQUA 6.0 (INSTRUMENTS) ×1 IMPLANT
SET HNDPC FAN SPRY TIP SCT (DISPOSABLE) ×1 IMPLANT
SET PAD KNEE POSITIONER (MISCELLANEOUS) ×1 IMPLANT
SOLUTION PRONTOSAN WOUND 350ML (IRRIGATION / IRRIGATOR) ×1 IMPLANT
SPIKE FLUID TRANSFER (MISCELLANEOUS) ×2 IMPLANT
STAPLER SKIN PROX 35W (STAPLE) IMPLANT
STEM TIB PERS SZ E 5D LT (Screw) IMPLANT
SUT MNCRL AB 3-0 PS2 18 (SUTURE) ×1 IMPLANT
SUT MON AB 2-0 CT1 36 (SUTURE) ×1 IMPLANT
SUT STRATAFIX 14 PDO 48 VLT (SUTURE) ×1 IMPLANT
SUT VIC AB 1 CTX36XBRD ANBCTR (SUTURE) ×2 IMPLANT
SUT VIC AB 2-0 CT1 TAPERPNT 27 (SUTURE) ×1 IMPLANT
SYR 3ML LL SCALE MARK (SYRINGE) ×1 IMPLANT
TOWEL GREEN STERILE FF (TOWEL DISPOSABLE) ×1 IMPLANT
TRAY FOLEY MTR SLVR 14FR STAT (SET/KITS/TRAYS/PACK) IMPLANT
TRAY FOLEY MTR SLVR 16FR STAT (SET/KITS/TRAYS/PACK) ×1 IMPLANT
TUBE SUCTION HIGH CAP CLEAR NV (SUCTIONS) ×1 IMPLANT
WATER STERILE IRR 1000ML POUR (IV SOLUTION) ×2 IMPLANT
WRAP KNEE MAXI GEL POST OP (GAUZE/BANDAGES/DRESSINGS) ×1 IMPLANT

## 2024-08-07 NOTE — Anesthesia Procedure Notes (Signed)
 Spinal  Patient location during procedure: OR Start time: 08/07/2024 12:58 PM End time: 08/07/2024 1:03 PM Reason for block: surgical anesthesia Staffing Performed: resident/CRNA  Resident/CRNA: Nanci Riis, CRNA Performed by: Nanci Riis, CRNA Authorized by: Jerrye Sharper, MD   Preanesthetic Checklist Completed: patient identified, IV checked, site marked, risks and benefits discussed, surgical consent, monitors and equipment checked, pre-op evaluation and timeout performed Spinal Block Patient position: sitting Prep: DuraPrep Patient monitoring: heart rate, cardiac monitor, continuous pulse ox and blood pressure Approach: midline Location: L3-4 Injection technique: single-shot Needle Needle type: Sprotte  Needle gauge: 24 G Needle length: 9 cm Assessment Sensory level: T4 Events: CSF return

## 2024-08-07 NOTE — Anesthesia Postprocedure Evaluation (Signed)
 Anesthesia Post Note  Patient: [name]  Procedure(s) Performed: ARTHROPLASTY, KNEE, TOTAL, USING IMAGELESS COMPUTER-ASSISTED NAVIGATION (Left: Knee)     Patient location during evaluation: PACU Anesthesia Type: Spinal Level of consciousness: awake Pain management: pain level controlled Vital Signs Assessment: post-procedure vital signs reviewed and stable Respiratory status: spontaneous breathing, respiratory function stable and nonlabored ventilation Cardiovascular status: blood pressure returned to baseline and stable Postop Assessment: no headache, no backache and no apparent nausea or vomiting Anesthetic complications: no   No notable events documented.  Last Vitals:  Vitals:   08/07/24 1630 08/07/24 1645  BP: 138/73 138/67  Pulse: 77 78  Resp: 19 17  Temp:    SpO2: 93% 93%    Last Pain:  Vitals:   08/07/24 1645  TempSrc:   PainSc: 0-No pain                 Delon Aisha Arch

## 2024-08-07 NOTE — Op Note (Signed)
 OPERATIVE REPORT  SURGEON: Redell Shoals, MD   ASSISTANT: Valery Potters, PA-C  PREOPERATIVE DIAGNOSIS: Primary Left knee arthritis.   POSTOPERATIVE DIAGNOSIS: Primary Left knee arthritis.   PROCEDURE: Computer assisted Left total knee arthroplasty.   IMPLANTS: Zimmer Persona PPS Cementless CR femur, size 6. Persona 0 degree Spiked Keel OsseoTi Tibia, size E. Vivacit-E polyethelyene insert, size 11 mm, CR. OsseoTi 3-Peg patella, size 32 mm.  ANESTHESIA:  MAC, Regional, and Spinal  TOURNIQUET TIME: Not utilized.   ESTIMATED BLOOD LOSS:-150 mL    ANTIBIOTICS: 2g Ancef .  DRAINS: None.  COMPLICATIONS: None   CONDITION: PACU - hemodynamically stable.   BRIEF CLINICAL NOTE: [name] is a 78 y.o. female with a long-standing history of Left knee arthritis. After failing conservative management, the patient was indicated for total knee arthroplasty. The risks, benefits, and alternatives to the procedure were explained, and the patient elected to proceed.  PROCEDURE IN DETAIL: Adductor canal block was obtained in the pre-op holding area. Once inside the operative room, spinal anesthesia was obtained, and a foley catheter was inserted. The patient was then positioned and the lower extremity was prepped and draped in the normal sterile surgical fashion.  A time-out was called verifying side and site of surgery. The patient received IV antibiotics within 60 minutes of beginning the procedure. A tourniquet was not utilized.   An anterior approach to the knee was performed utilizing a midvastus arthrotomy. A medial release was performed and the patellar fat pad was excised. Stryker imageless navigation was used to cut the distal femur perpendicular to the mechanical axis. A freehand patellar resection was performed, and the patella was sized an prepared with 3 lug holes.  Nagivation was used to make a neutral proximal tibia resection, taking 4 mm of bone from the less affected lateral side  with 3 degrees of slope. The menisci were excised. A spacer block was placed, and the alignment and balance in extension were confirmed.   The distal femur was sized using the 3-degree external rotation guide referencing the posterior femoral cortex. The appropriate 4-in-1 cutting block was pinned into place. Rotation was checked using Whiteside's line, the epicondylar axis, and then confirmed with a spacer block in flexion. The remaining femoral cuts were performed, taking care to protect the MCL.  The tibia was sized and the trial tray was pinned into place. The remaining trail components were inserted. The knee was stable to varus and valgus stress through a full range of motion. The patella tracked centrally, and the PCL was well balanced. The trial components were removed, and the proximal tibial surface was prepared. Final components were impacted into place. The knee was tested for a final time and found to be well balanced.   The wound was copiously irrigated with Prontosan solution and normal saline using pulse lavage.  Marcaine  solution was injected into the periarticular soft tissue.  The wound was closed in layers using #1 Vicryl and Stratafix for the fascia, 2-0 Vicryl for the subcutaneous fat, 2-0 Monocryl for the deep dermal layer, and skin staples. Dermabond was applied to the skin.  Once the glue was fully dried, an Aquacell Ag and compressive dressing were applied.  The patient was transported to the recovery room in stable condition.  Sponge, needle, and instrument counts were correct at the end of the case x2.  The patient tolerated the procedure well and there were no known complications.  Please note that a surgical assistant was a medical necessity for this  procedure in order to perform it in a safe and expeditious manner. Surgical assistant was necessary to retract the ligaments and vital neurovascular structures to prevent injury to them and also necessary for proper positioning of  the limb to allow for anatomic placement of the prosthesis.

## 2024-08-07 NOTE — Discharge Instructions (Signed)
 Dr. Samson Frederic Total Joint Specialist [facility] [address] [place], [place] [address] [phone]  TOTAL KNEE REPLACEMENT POSTOPERATIVE DIRECTIONS    Knee Rehabilitation, Guidelines Following Surgery  Results after knee surgery are often greatly improved when you follow the exercise, range of motion and muscle strengthening exercises prescribed by your doctor. Safety measures are also important to protect the knee from further injury. Any time any of these exercises cause you to have increased pain or swelling in your knee joint, decrease the amount until you are comfortable again and slowly increase them. If you have problems or questions, call your caregiver or physical therapist for advice.   WEIGHT BEARING Weight bearing as tolerated with assist device (walker, cane, etc) as directed, use it as long as suggested by your surgeon or therapist, typically at least 4-6 weeks.  HOME CARE INSTRUCTIONS  Remove items at home which could result in a fall. This includes throw rugs or furniture in walking pathways.  Continue medications as instructed at time of discharge. You may have some home medications which will be placed on hold until you complete the course of blood thinner medication.  You may start showering once you are discharged home but do not submerge the incision under water. Just pat the incision dry and apply a dry gauze dressing on daily. Walk with walker as instructed.  You may resume a sexual relationship in one month or when given the OK by your doctor.  Use walker as long as suggested by your caregivers. Avoid periods of inactivity such as sitting longer than an hour when not asleep. This helps prevent blood clots.  You may put full weight on your legs and walk as much as is comfortable.  You may return to work once you are cleared by your doctor.  Do not drive a car for 6 weeks or until released by you surgeon.  Do not drive while  taking narcotics.  Wear the elastic stockings for three weeks following surgery during the day but you may remove then at night. Make sure you keep all of your appointments after your operation with all of your doctors and caregivers. You should call the office at the above phone number and make an appointment for approximately two weeks after the date of your surgery. Do not remove your surgical dressing. The dressing is waterproof; you may take showers in 3 days, but do not take tub baths or submerge the dressing. Please pick up a stool softener and laxative for home use as long as you are requiring pain medications. ICE to the affected knee every three hours for 30 minutes at a time and then as needed for pain and swelling.  Continue to use ice on the knee for pain and swelling from surgery. You may notice swelling that will progress down to the foot and ankle.  This is normal after surgery.  Elevate the leg when you are not up walking on it.   It is important for you to complete the blood thinner medication as prescribed by your doctor. Continue to use the breathing machine which will help keep your temperature down.  It is common for your temperature to cycle up and down following surgery, especially at night when you are not up moving around and exerting yourself.  The breathing machine keeps your lungs expanded and your temperature down.  RANGE OF MOTION AND STRENGTHENING EXERCISES  Rehabilitation of the knee is important following a knee injury or an  operation. After just a few days of immobilization, the muscles of the thigh which control the knee become weakened and shrink (atrophy). Knee exercises are designed to build up the tone and strength of the thigh muscles and to improve knee motion. Often times heat used for twenty to thirty minutes before working out will loosen up your tissues and help with improving the range of motion but do not use heat for the first two weeks following surgery.  These exercises can be done on a training (exercise) mat, on the floor, on a table or on a bed. Use what ever works the best and is most comfortable for you Knee exercises include:  Leg Lifts - While your knee is still immobilized in a splint or cast, you can do straight leg raises. Lift the leg to 60 degrees, hold for 3 sec, and slowly lower the leg. Repeat 10-20 times 2-3 times daily. Perform this exercise against resistance later as your knee gets better.  Quad and Hamstring Sets - Tighten up the muscle on the front of the thigh (Quad) and hold for 5-10 sec. Repeat this 10-20 times hourly. Hamstring sets are done by pushing the foot backward against an object and holding for 5-10 sec. Repeat as with quad sets.  A rehabilitation program following serious knee injuries can speed recovery and prevent re-injury in the future due to weakened muscles. Contact your doctor or a physical therapist for more information on knee rehabilitation.   POST-OPERATIVE OPIOID TAPER INSTRUCTIONS: It is important to wean off of your opioid medication as soon as possible. If you do not need pain medication after your surgery it is ok to stop day one. Opioids include: Codeine, Hydrocodone(Norco, Vicodin), Oxycodone(Percocet, oxycontin) and hydromorphone amongst others.  Long term and even short term use of opiods can cause: Increased pain response Dependence Constipation Depression Respiratory depression And more.  Withdrawal symptoms can include Flu like symptoms Nausea, vomiting And more Techniques to manage these symptoms Hydrate well Eat regular healthy meals Stay active Use relaxation techniques(deep breathing, meditating, yoga) Do Not substitute Alcohol to help with tapering If you have been on opioids for less than two weeks and do not have pain than it is ok to stop all together.  Plan to wean off of opioids This plan should start within one week post op of your joint replacement. Maintain the same  interval or time between taking each dose and first decrease the dose.  Cut the total daily intake of opioids by one tablet each day Next start to increase the time between doses. The last dose that should be eliminated is the evening dose.    SKILLED REHAB INSTRUCTIONS: If the patient is transferred to a skilled rehab facility following release from the hospital, a list of the current medications will be sent to the facility for the patient to continue.  When discharged from the skilled rehab facility, please have the facility set up the patient's Home Health Physical Therapy prior to being released. Also, the skilled facility will be responsible for providing the patient with their medications at time of release from the facility to include their pain medication, the muscle relaxants, and their blood thinner medication. If the patient is still at the rehab facility at time of the two week follow up appointment, the skilled rehab facility will also need to assist the patient in arranging follow up appointment in our office and any transportation needs.  MAKE SURE YOU:  Understand these instructions.  Will watch  your condition.  Will get help right away if you are not doing well or get worse.    Pick up stool softner and laxative for home use following surgery while on pain medications. Do NOT remove your dressing. You may shower.  Do not take tub baths or submerge incision under water. May shower starting three days after surgery. Please use a clean towel to pat the incision dry following showers. Continue to use ice for pain and swelling after surgery. Do not use any lotions or creams on the incision until instructed by your surgeon.

## 2024-08-07 NOTE — Transfer of Care (Signed)
 Immediate Anesthesia Transfer of Care Note  Patient: [name]  Procedure(s) Performed: ARTHROPLASTY, KNEE, TOTAL, USING IMAGELESS COMPUTER-ASSISTED NAVIGATION (Left: Knee)  Patient Location: PACU  Anesthesia Type:Spinal and MAC combined with regional for post-op pain  Level of Consciousness: drowsy and patient cooperative  Airway & Oxygen  Therapy: Patient Spontanous Breathing  Post-op Assessment: Report given to RN and Post -op Vital signs reviewed and stable  Post vital signs: Reviewed and stable  Last Vitals:  Vitals Value Taken Time  BP    Temp    Pulse    Resp 16 08/07/24 15:37  SpO2    Vitals shown include unfiled device data.  Last Pain:  Vitals:   08/07/24 1023  TempSrc: Oral         Complications: No notable events documented.

## 2024-08-07 NOTE — Anesthesia Procedure Notes (Signed)
 Anesthesia Regional Block: Adductor canal block   Pre-Anesthetic Checklist: , timeout performed,  Correct Patient, Correct Site, Correct Laterality,  Correct Procedure, Correct Position, site marked,  Risks and benefits discussed,  Surgical consent,  Pre-op evaluation,  At surgeon's request and post-op pain management  Laterality: Left  Prep: chloraprep       Needles:  Injection technique: Single-shot  Needle Type: Echogenic Stimulator Needle     Needle Length: 10cm  Needle Gauge: 21   Needle insertion depth: 6 cm   Additional Needles:   Procedures:,,,, ultrasound used (permanent image in chart),,    Narrative:  Start time: 08/07/2024 12:17 PM End time: 08/07/2024 12:22 PM Injection made incrementally with aspirations every 5 mL.  Performed by: Personally  Anesthesiologist: Jerrye Sharper, MD  Additional Notes: Timeout performed. Patient sedated. Relevant anatomy ID'd using US . Incremental 2-5ml injection of LA with frequent aspiration. Patient tolerated procedure well.

## 2024-08-07 NOTE — Addendum Note (Signed)
 Addendum  created 08/07/24 1703 by Peggye Delon Brunswick, MD   Attestation recorded in Intraprocedure, Intraprocedure Attestations filed

## 2024-08-07 NOTE — Interval H&P Note (Signed)
 History and Physical Interval Note:  08/07/2024 12:14 PM  [name]  has presented today for surgery, with the diagnosis of Left knee osteoarthritis.  The various methods of treatment have been discussed with the patient and family. After consideration of risks, benefits and other options for treatment, the patient has consented to  Procedure(s): ARTHROPLASTY, KNEE, TOTAL, USING IMAGELESS COMPUTER-ASSISTED NAVIGATION (Left) as a surgical intervention.  The patient's history has been reviewed, patient examined, no change in status, stable for surgery.  I have reviewed the patient's chart and labs.  Questions were answered to the patient's satisfaction.     [name]

## 2024-08-07 NOTE — Care Plan (Signed)
 Ortho Bundle Case Management Note  Patient Details  Name: [name] MRN: [id number] Date of Birth: Oct 23, 1946  LT TKA on 08/07/24  DCP: Home with daughter DME: RW, ordered through [employer] PT: EO                   DME Arranged:  Walker rolling DME Agency:  [employer]  HH Arranged:    HH Agency:     Additional Comments: Please contact me with any questions of if this plan should need to change.  Burnard Dross, Case Manager [facility] [phone]  Ext. [phone]   08/07/2024, 9:59 AM

## 2024-08-08 ENCOUNTER — Other Ambulatory Visit (HOSPITAL_COMMUNITY): Payer: Self-pay

## 2024-08-08 ENCOUNTER — Encounter (HOSPITAL_COMMUNITY): Payer: Self-pay | Admitting: Orthopedic Surgery

## 2024-08-08 DIAGNOSIS — M1712 Unilateral primary osteoarthritis, left knee: Secondary | ICD-10-CM | POA: Diagnosis not present

## 2024-08-08 DIAGNOSIS — M810 Age-related osteoporosis without current pathological fracture: Secondary | ICD-10-CM | POA: Diagnosis not present

## 2024-08-08 DIAGNOSIS — G250 Essential tremor: Secondary | ICD-10-CM | POA: Diagnosis not present

## 2024-08-08 DIAGNOSIS — J432 Centrilobular emphysema: Secondary | ICD-10-CM | POA: Diagnosis not present

## 2024-08-08 DIAGNOSIS — J849 Interstitial pulmonary disease, unspecified: Secondary | ICD-10-CM | POA: Diagnosis not present

## 2024-08-08 DIAGNOSIS — N182 Chronic kidney disease, stage 2 (mild): Secondary | ICD-10-CM | POA: Diagnosis not present

## 2024-08-08 DIAGNOSIS — M51369 Other intervertebral disc degeneration, lumbar region without mention of lumbar back pain or lower extremity pain: Secondary | ICD-10-CM | POA: Diagnosis not present

## 2024-08-08 DIAGNOSIS — Z87891 Personal history of nicotine dependence: Secondary | ICD-10-CM | POA: Diagnosis not present

## 2024-08-08 DIAGNOSIS — E782 Mixed hyperlipidemia: Secondary | ICD-10-CM | POA: Diagnosis not present

## 2024-08-08 DIAGNOSIS — Z96652 Presence of left artificial knee joint: Secondary | ICD-10-CM | POA: Diagnosis not present

## 2024-08-08 DIAGNOSIS — E1122 Type 2 diabetes mellitus with diabetic chronic kidney disease: Secondary | ICD-10-CM | POA: Diagnosis not present

## 2024-08-08 DIAGNOSIS — M069 Rheumatoid arthritis, unspecified: Secondary | ICD-10-CM | POA: Diagnosis not present

## 2024-08-08 DIAGNOSIS — Z7984 Long term (current) use of oral hypoglycemic drugs: Secondary | ICD-10-CM | POA: Diagnosis not present

## 2024-08-08 DIAGNOSIS — K222 Esophageal obstruction: Secondary | ICD-10-CM | POA: Diagnosis not present

## 2024-08-08 DIAGNOSIS — Z6823 Body mass index (BMI) 23.0-23.9, adult: Secondary | ICD-10-CM | POA: Diagnosis not present

## 2024-08-08 DIAGNOSIS — K449 Diaphragmatic hernia without obstruction or gangrene: Secondary | ICD-10-CM | POA: Diagnosis not present

## 2024-08-08 DIAGNOSIS — K219 Gastro-esophageal reflux disease without esophagitis: Secondary | ICD-10-CM | POA: Diagnosis not present

## 2024-08-08 DIAGNOSIS — Z8672 Personal history of thrombophlebitis: Secondary | ICD-10-CM | POA: Diagnosis not present

## 2024-08-08 DIAGNOSIS — G2581 Restless legs syndrome: Secondary | ICD-10-CM | POA: Diagnosis not present

## 2024-08-08 DIAGNOSIS — Z79899 Other long term (current) drug therapy: Secondary | ICD-10-CM | POA: Diagnosis not present

## 2024-08-08 DIAGNOSIS — R519 Headache, unspecified: Secondary | ICD-10-CM | POA: Diagnosis not present

## 2024-08-08 LAB — CBC
HCT: 28 % — ABNORMAL LOW (ref 36.0–46.0)
Hemoglobin: 8.4 g/dL — ABNORMAL LOW (ref 12.0–15.0)
MCH: 28 pg (ref 26.0–34.0)
MCHC: 30 g/dL (ref 30.0–36.0)
MCV: 93.3 fL (ref 80.0–100.0)
Platelets: 226 K/uL (ref 150–400)
RBC: 3 MIL/uL — ABNORMAL LOW (ref 3.87–5.11)
RDW: 18.6 % — ABNORMAL HIGH (ref 11.5–15.5)
WBC: 4.7 K/uL (ref 4.0–10.5)
nRBC: 0 % (ref 0.0–0.2)

## 2024-08-08 LAB — BASIC METABOLIC PANEL WITH GFR
Anion gap: 9 (ref 5–15)
BUN: 7 mg/dL — ABNORMAL LOW (ref 8–23)
CO2: 23 mmol/L (ref 22–32)
Calcium: 8.3 mg/dL — ABNORMAL LOW (ref 8.9–10.3)
Chloride: 107 mmol/L (ref 98–111)
Creatinine, Ser: 0.86 mg/dL (ref 0.44–1.00)
GFR, Estimated: >60 mL/min (ref >60)
Glucose, Bld: 73 mg/dL (ref 70–99)
Potassium: 4.2 mmol/L (ref 3.5–5.1)
Sodium: 139 mmol/L (ref 135–145)

## 2024-08-08 MED ORDER — OXYCODONE HCL 5 MG PO TABS
5.0000 mg | ORAL_TABLET | ORAL | 0 refills | Status: DC | PRN
  Filled 2024-08-08: qty 40, 7d supply, fill #0

## 2024-08-08 MED ORDER — SENNA 8.6 MG PO TABS
2.0000 | ORAL_TABLET | Freq: Every day | ORAL | 0 refills | Status: AC
  Filled 2024-08-08: qty 30, 15d supply, fill #0

## 2024-08-08 MED ORDER — DOCUSATE SODIUM 100 MG PO CAPS
100.0000 mg | ORAL_CAPSULE | Freq: Two times a day (BID) | ORAL | 0 refills | Status: AC
  Filled 2024-08-08: qty 60, 30d supply, fill #0

## 2024-08-08 MED ORDER — ASPIRIN 81 MG PO CHEW
81.0000 mg | CHEWABLE_TABLET | Freq: Two times a day (BID) | ORAL | 0 refills | Status: AC
Start: 2024-08-08 — End: 2024-09-22
  Filled 2024-08-08: qty 90, 45d supply, fill #0

## 2024-08-08 MED ORDER — HYDROXYUREA 500 MG PO CAPS
500.0000 mg | ORAL_CAPSULE | Freq: Two times a day (BID) | ORAL | Status: DC
  Administered 2024-08-08: 500 mg via ORAL
  Filled 2024-08-08: qty 1

## 2024-08-08 MED ORDER — ONDANSETRON HCL 4 MG PO TABS
4.0000 mg | ORAL_TABLET | Freq: Three times a day (TID) | ORAL | 0 refills | Status: AC | PRN
  Filled 2024-08-08: qty 30, 10d supply, fill #0

## 2024-08-08 MED ORDER — POLYETHYLENE GLYCOL 3350 17 GM/SCOOP PO POWD
17.0000 g | Freq: Every day | ORAL | 0 refills | Status: AC | PRN
  Filled 2024-08-08: qty 238, 14d supply, fill #0

## 2024-08-08 NOTE — Progress Notes (Signed)
 Physical Therapy Treatment Patient Details Name: [name] MRN: [id number] DOB: 16-Jan-1946 Today's Date: 08/08/2024   History of Present Illness Pt s/p L TKR and with hx of COPD, DDD, CVA (no residual)    PT Comments  Pt motivated and progressing well with mobility.  Pt up to ambulate increased distance in hall, negotiated stairs, reviewed written HEP and reviewed dressing techniques.  Pt's granddaughter present for session.  Pt eager to dc home this date.    If plan is discharge home, recommend the following: A little help with walking and/or transfers;A little help with bathing/dressing/bathroom;Assistance with cooking/housework;Assist for transportation;Help with stairs or ramp for entrance   Can travel by private vehicle        Equipment Recommendations  Rolling walker (2 wheels)    Recommendations for Other Services       Precautions / Restrictions Precautions Precautions: Fall;Knee Restrictions Weight Bearing Restrictions Per Provider Order: Yes LLE Weight Bearing Per Provider Order: Weight bearing as tolerated     Mobility  Bed Mobility Overal bed mobility: Needs Assistance Bed Mobility: Supine to Sit     Supine to sit: Contact guard     General bed mobility comments: Pt up in chair and returns to same    Transfers Overall transfer level: Needs assistance Equipment used: Rolling walker (2 wheels) Transfers: Sit to/from Stand Sit to Stand: Contact guard assist, Supervision           General transfer comment: cues for LE management and use of UEs to self assist    Ambulation/Gait Ambulation/Gait assistance: Contact guard assist, Supervision Gait Distance (Feet): 100 Feet Assistive device: Rolling walker (2 wheels) Gait Pattern/deviations: Decreased step length - right, Decreased step length - left, Shuffle, Trunk flexed, Step-to pattern, Step-through pattern Gait velocity: decr     General Gait Details: cues for sequence, posture and position  from RW   Stairs Stairs: Yes Stairs assistance: Min assist Stair Management: Two rails, Step to pattern, Forwards Number of Stairs: 5 General stair comments: 2+3 stairs wtih cues for sequence   Wheelchair Mobility     Tilt Bed    Modified Rankin (Stroke Patients Only)       Balance Overall balance assessment: Mild deficits observed, not formally tested                                          Communication Communication Communication: No apparent difficulties  Cognition Arousal: Alert Behavior During Therapy: WFL for tasks assessed/performed   PT - Cognitive impairments: No apparent impairments                         Following commands: Intact      Cueing Cueing Techniques: Verbal cues  Exercises Total Joint Exercises Ankle Circles/Pumps: AROM, Both, 15 reps, Supine Quad Sets: AROM, Both, 10 reps, Supine Heel Slides: AAROM, Left, 15 reps, Supine Straight Leg Raises: Left, 15 reps, AAROM, AROM, Supine Long Arc Quad: AAROM, Left, 10 reps, Seated    General Comments        Pertinent Vitals/Pain Pain Assessment Pain Assessment: 0-10 Pain Score: 6  Pain Location: L knee Pain Descriptors / Indicators: Aching, Sore Pain Intervention(s): Limited activity within patient's tolerance, Monitored during session, Premedicated before session, Patient requesting pain meds-RN notified    Home Living Family/patient expects to be discharged to:: Private residence Living Arrangements: Children  Available Help at Discharge: Family;Available 24 hours/day Type of Home: House Home Access: Stairs to enter Entrance Stairs-Rails: Doctor, general practice of Steps: 3   Home Layout: One level Home Equipment: None      Prior Function            PT Goals (current goals can now be found in the care plan section) Acute Rehab PT Goals Patient Stated Goal: Regain IND PT Goal Formulation: With patient Time For Goal Achievement:  08/15/24 Potential to Achieve Goals: Good Progress towards PT goals: Progressing toward goals    Frequency    7X/week      PT Plan      Co-evaluation              AM-PAC PT 6 Clicks Mobility   Outcome Measure  Help needed turning from your back to your side while in a flat bed without using bedrails?: A Little Help needed moving from lying on your back to sitting on the side of a flat bed without using bedrails?: A Little Help needed moving to and from a bed to a chair (including a wheelchair)?: A Little Help needed standing up from a chair using your arms (e.g., wheelchair or bedside chair)?: A Little Help needed to walk in hospital room?: A Little Help needed climbing 3-5 steps with a railing? : A Little 6 Click Score: 18    End of Session Equipment Utilized During Treatment: Gait belt Activity Tolerance: Patient tolerated treatment well Patient left: in chair;with call bell/phone within reach;with chair alarm set;with family/visitor present Nurse Communication: Mobility status PT Visit Diagnosis: Unsteadiness on feet (R26.81);Difficulty in walking, not elsewhere classified (R26.2)     Time: 8860-8797 PT Time Calculation (min) (ACUTE ONLY): 23 min  Charges:    $Gait Training: 8-22 mins $Therapeutic Exercise: 8-22 mins $Therapeutic Activity: 8-22 mins PT General Charges $$ ACUTE PT VISIT: 1 Visit                     [facility] PT Acute Rehabilitation Services Office [phone]    Chayah Mckee 08/08/2024, 12:51 PM

## 2024-08-08 NOTE — TOC Transition Note (Signed)
 Transition of Care [facility]) - Discharge Note   Patient Details  Name: [name] MRN: [id number] Date of Birth: [birth year]  Transition of [facility]) CM/SW Contact:  Alfonse JONELLE Rex, RN Phone Number: 08/08/2024, 12:20 PM   Clinical Narrative:  s/p Left TKA, OPPT-EO, Youth RW delivered to bedside by [employer]. No TOC needs.      Final next level of care: OP Rehab Barriers to Discharge: No Barriers Identified   Patient Goals and CMS Choice Patient states their goals for this hospitalization and ongoing recovery are:: return home          Discharge Placement                       Discharge Plan and Services Additional resources added to the After Visit Summary for                  DME Arranged: Walker rolling DME Agency: [employer]                  Social Drivers of Health (SDOH) Interventions SDOH Screenings   Food Insecurity: No Food Insecurity (08/07/2024)  Housing: Unknown (08/07/2024)  Transportation Needs: No Transportation Needs (08/07/2024)  Utilities: Not At Risk (08/07/2024)  Depression (PHQ2-9): Low Risk  (07/01/2024)  Financial Resource Strain: Low Risk  (04/28/2021)  Recent Concern: Financial Resource Strain - High Risk (03/01/2021)  Physical Activity: Inactive (03/01/2021)  Social Connections: Moderately Integrated (08/07/2024)  Stress: No Stress Concern Present (12/09/2021)  Tobacco Use: Medium Risk (08/07/2024)     Readmission Risk Interventions     No data to display

## 2024-08-08 NOTE — Progress Notes (Signed)
 Discharge nediations delivered to patient at bedside.

## 2024-08-08 NOTE — Progress Notes (Addendum)
    Subjective:  Patient reports pain as mild to moderate.  Denies N/V/CP/SOB/Abd pain. She denies tingling or numbness in LE bilaterally.  Catheter removed this am.  Family at bedside.  Up with PT today.  Incentive spirometry at bedside. Continue.   Objective:   VITALS:   Vitals:   08/07/24 2309 08/08/24 0136 08/08/24 0531 08/08/24 0802  BP: (!) 112/56 (!) 112/56 (!) 111/59   Pulse: 82 73 78   Resp: 18 18 18    Temp: 98.9 F (37.2 C) 98.9 F (37.2 C) 98.8 F (37.1 C)   TempSrc: Oral Oral Oral   SpO2: 97% 97% 98% 100%  Weight:      Height:        NAD Neurologically intact ABD soft Neurovascular intact Sensation intact distally Intact pulses distally Dorsiflexion/Plantar flexion intact Incision: dressing C/D/I No cellulitis present Compartment soft   Lab Results  Component Value Date   WBC 4.7 08/08/2024   HGB 8.4 (L) 08/08/2024   HCT 28.0 (L) 08/08/2024   MCV 93.3 08/08/2024   PLT 226 08/08/2024   BMET    Component Value Date/Time   NA 139 08/08/2024 0340   NA 145 11/24/2017 0754   NA 142 05/25/2017 0755   K 4.2 08/08/2024 0340   K 3.7 11/24/2017 0754   K 3.9 05/25/2017 0755   CL 107 08/08/2024 0340   CL 104 11/24/2017 0754   CO2 23 08/08/2024 0340   CO2 27 11/24/2017 0754   CO2 24 05/25/2017 0755   GLUCOSE 73 08/08/2024 0340   GLUCOSE 123 (H) 11/24/2017 0754   BUN 7 (L) 08/08/2024 0340   BUN 12 11/24/2017 0754   BUN 13.3 05/25/2017 0755   CREATININE 0.86 08/08/2024 0340   CREATININE 0.80 07/01/2024 0959   CREATININE 1.0 11/24/2017 0754   CREATININE 0.8 05/25/2017 0755   CALCIUM  8.3 (L) 08/08/2024 0340   CALCIUM  9.1 11/24/2017 0754   CALCIUM  9.5 05/25/2017 0755   EGFR 82 (L) 05/25/2017 0755   GFRNONAA >60 08/08/2024 0340   GFRNONAA >60 07/01/2024 0959     Assessment/Plan: 1 Day Post-Op   Principal Problem:   S/P total knee arthroplasty, left  ABLA. Hemoglobin 8.4 today. Continue to monitor.   WBAT with walker DVT ppx: Aspirin ,  SCDs, TEDS PO pain control PT/OT: TO come today.  Dispo:  - D/c home with OPPT once cleared with PT and voided.    Valery GORMAN Potters 08/08/2024, 10:55 AM   [facility]  [place] [address], [place], [place] [address] Phone: [phone] [email or web address] Facebook  [employer]

## 2024-08-08 NOTE — Discharge Summary (Signed)
 Physician Discharge Summary  Patient ID: [name] MRN: [id number] DOB/AGE: [birth year] 78 y.o.  Admit date: 08/07/2024 Discharge date: 08/08/2024  Admission Diagnoses:  S/P total knee arthroplasty, left  Discharge Diagnoses:  Principal Problem:   S/P total knee arthroplasty, left   Past Medical History:  Diagnosis Date   Allergic rhinitis    Allergy    seasonal allergies   Anemia    hx of   Asthma    on meds   Cataract    bilateral sx    Chronic bronchitis (HCC)    Colonic polyp    COPD (chronic obstructive pulmonary disease) (HCC)    DDD (degenerative disc disease), lumbar 07/20/2011   DJD (degenerative joint disease) of knee    bilateral   DJD (degenerative joint disease), lumbar 07/20/2011   Esophageal stricture    GERD (gastroesophageal reflux disease)    on meds   Hiatal hernia    Hyperlipidemia    on meds   Osteoporosis    Stroke (HCC) 2017   didn't know I'd had one before I was told; didn't do any damage (11/24/2016)   Vocal cord dysfunction     Surgeries: Procedure(s): ARTHROPLASTY, KNEE, TOTAL, USING IMAGELESS COMPUTER-ASSISTED NAVIGATION on 08/07/2024   Consultants (if any):   Discharged Condition: Improved  Hospital Course: [name] is an 78 y.o. female who was admitted 08/07/2024 with a diagnosis of S/P total knee arthroplasty, left and went to the operating room on 08/07/2024 and underwent the above named procedures.    She was given perioperative antibiotics:  Anti-infectives (From admission, onward)    Start     Dose/Rate Route Frequency Ordered Stop   08/07/24 2000  ceFAZolin  (ANCEF ) IVPB 2g/100 mL premix        2 g 200 mL/hr over 30 Minutes Intravenous Every 6 hours 08/07/24 1722 08/08/24 0842   08/07/24 1450  vancomycin  (VANCOCIN ) powder  Status:  Discontinued          As needed 08/07/24 1448 08/07/24 1533   08/07/24 0915  ceFAZolin  (ANCEF ) IVPB 2g/100 mL premix        2 g 200 mL/hr over 30 Minutes Intravenous On call to O.R.  08/07/24 0913 08/07/24 1308       She was given sequential compression devices, early ambulation, and aspirin  for DVT prophylaxis.  POD#1 Doing well. ABLA. Hemoglobin 8.4. She ambulated 75 and `00 feet with PT. She was discharged home with OPPT. Follow-up in 2 weeks for repeat evaluation.   She benefited maximally from the hospital stay and there were no complications.    Recent vital signs:  Vitals:   08/08/24 0531 08/08/24 0802  BP: (!) 111/59   Pulse: 78   Resp: 18   Temp: 98.8 F (37.1 C)   SpO2: 98% 100%    Recent laboratory studies:  Lab Results  Component Value Date   HGB 8.4 (L) 08/08/2024   HGB 10.4 (L) 07/26/2024   HGB 8.6 (L) 07/01/2024   Lab Results  Component Value Date   WBC 4.7 08/08/2024   PLT 226 08/08/2024   Lab Results  Component Value Date   INR 1.10 12/12/2014   Lab Results  Component Value Date   NA 139 08/08/2024   K 4.2 08/08/2024   CL 107 08/08/2024   CO2 23 08/08/2024   BUN 7 (L) 08/08/2024   CREATININE 0.86 08/08/2024   GLUCOSE 73 08/08/2024     Allergies as of 08/08/2024  Reactions   Statins Other (See Comments)   Leg cramps   Penicillins Itching, Rash   Did it involve swelling of the face/tongue/throat, SOB, or low BP? N Did it involve sudden or severe rash/hives, skin peeling, or any reaction on the inside of your mouth or nose? Y Did you need to seek medical attention at a hospital or doctor's office? N When did it last happen? Decades Ago      If all above answers are NO, may proceed with cephalosporin use. Tolerated Cephalosporin Date: 08/07/2024.   Pravastatin  Other (See Comments)   leg cramps        Medication List     PAUSE taking these medications    methotrexate  2.5 MG tablet Wait to take this until your doctor or other care provider tells you to start again. Commonly known as: RHEUMATREX Take 2.5 mg by mouth every Monday.       STOP taking these medications    aspirin  EC 81 MG tablet Replaced  by: Aspirin  Low Dose 81 MG chewable tablet       TAKE these medications    acetaminophen  500 MG tablet Commonly known as: TYLENOL  Take 500-1,000 mg by mouth every 6 (six) hours as needed (pain.).   albuterol  108 (90 Base) MCG/ACT inhaler Commonly known as: Proventil  HFA Inhale 1-2 puffs into the lungs every 6 (six) hours as needed for wheezing or shortness of breath.   Aspirin  Low Dose 81 MG chewable tablet Generic drug: aspirin  Chew 1 tablet (81 mg total) by mouth 2 (two) times daily with a meal. Replaces: aspirin  EC 81 MG tablet   Breztri  Aerosphere 160-9-4.8 MCG/ACT Aero inhaler Generic drug: budesonide -glycopyrrolate -formoterol  Inhale 2 puffs into the lungs in the morning and at bedtime.   Breztri  Aerosphere 160-9-4.8 MCG/ACT Aero inhaler Generic drug: budesonide -glycopyrrolate -formoterol  1 sample provided   CALCIUM -CHOLECALCIFEROL  PO Take 1 tablet by mouth in the morning.   CENTRUM SILVER 50+WOMEN PO Take 1 tablet by mouth in the morning.   cetirizine  10 MG tablet Commonly known as: ZyrTEC  Allergy Take 1 tablet (10 mg total) by mouth daily.   cholecalciferol  25 MCG (1000 UNIT) tablet Commonly known as: VITAMIN D3 Take 1,000 Units by mouth daily.   cyanocobalamin  1000 MCG tablet Commonly known as: VITAMIN B12 Take 1,000 mcg by mouth in the morning and at bedtime.   docusate sodium  100 MG capsule Commonly known as: Colace Take 1 capsule (100 mg total) by mouth 2 (two) times daily.   ferrous sulfate  325 (65 FE) MG tablet Take 325 mg by mouth daily.   fluticasone  50 MCG/ACT nasal spray Commonly known as: FLONASE  Place 1 spray into the nose 2 (two) times daily.   folic acid  400 MCG tablet Commonly known as: FOLVITE  Take 400 mcg by mouth in the morning.   gabapentin  100 MG capsule Commonly known as: NEURONTIN  Take 100 mg by mouth 2 (two) times daily.   hydroxyurea  500 MG capsule Commonly known as: HYDREA  Take 1 capsule (500 mg total) by mouth 3  (three) times daily before meals. What changed: when to take this   ipratropium 0.02 % nebulizer solution Commonly known as: ATROVENT  Take 2.5 mLs (0.5 mg total) by nebulization every 6 (six) hours. What changed:  when to take this reasons to take this   ipratropium 17 MCG/ACT inhaler Commonly known as: ATROVENT  HFA Inhale 2 puffs into the lungs every 6 (six) hours as needed for wheezing.   levalbuterol  0.63 MG/3ML nebulizer solution Commonly known as: XOPENEX  Take  3 mLs (0.63 mg total) by nebulization every 6 (six) hours. What changed:  when to take this reasons to take this   magnesium  oxide 400 MG tablet Commonly known as: MAG-OX Take 400 mg by mouth in the morning.   montelukast  10 MG tablet Commonly known as: SINGULAIR  Take 1 tablet (10 mg total) by mouth at bedtime.   ondansetron  4 MG tablet Commonly known as: Zofran  Take 1 tablet (4 mg total) by mouth every 8 (eight) hours as needed for nausea or vomiting.   oxyCODONE  5 MG immediate release tablet Commonly known as: Roxicodone  Take 1 tablet (5 mg total) by mouth every 4 (four) hours as needed for severe pain (pain score 7-10).   pantoprazole  20 MG tablet Commonly known as: PROTONIX  Take 1 tablet (20 mg total) by mouth 2 (two) times daily.   PARoxetine  20 MG tablet Commonly known as: PAXIL  Take 20 mg by mouth every morning.   polyethylene glycol powder 17 GM/SCOOP powder Commonly known as: GLYCOLAX /MIRALAX  Take 17 g by mouth daily as needed for mild constipation or moderate constipation. Dissolve 1 capful (17g) in 4-8 ounces of liquid and take by mouth daily.   rosuvastatin  10 MG tablet Commonly known as: CRESTOR  Take 10 mg by mouth at bedtime.   senna 8.6 MG Tabs tablet Commonly known as: SENOKOT Take 2 tablets (17.2 mg total) by mouth at bedtime for 15 days.               Discharge Care Instructions  (From admission, onward)           Start     Ordered   08/08/24 0000  Weight bearing as  tolerated        08/08/24 1114   08/08/24 0000  Change dressing       Comments: Do not remove your dressing.   08/08/24 1114              WEIGHT BEARING   Weight bearing as tolerated with assist device (walker, cane, etc) as directed, use it as long as suggested by your surgeon or therapist, typically at least 4-6 weeks.   EXERCISES  Results after joint replacement surgery are often greatly improved when you follow the exercise, range of motion and muscle strengthening exercises prescribed by your doctor. Safety measures are also important to protect the joint from further injury. Any time any of these exercises cause you to have increased pain or swelling, decrease what you are doing until you are comfortable again and then slowly increase them. If you have problems or questions, call your caregiver or physical therapist for advice.   Rehabilitation is important following a joint replacement. After just a few days of immobilization, the muscles of the leg can become weakened and shrink (atrophy).  These exercises are designed to build up the tone and strength of the thigh and leg muscles and to improve motion. Often times heat used for twenty to thirty minutes before working out will loosen up your tissues and help with improving the range of motion but do not use heat for the first two weeks following surgery (sometimes heat can increase post-operative swelling).   These exercises can be done on a training (exercise) mat, on the floor, on a table or on a bed. Use whatever works the best and is most comfortable for you.    Use music or television while you are exercising so that the exercises are a pleasant break in your day. This will make your  life better with the exercises acting as a break in your routine that you can look forward to.   Perform all exercises about fifteen times, three times per day or as directed.  You should exercise both the operative leg and the other leg as  well.  Exercises include:   Quad Sets - Tighten up the muscle on the front of the thigh (Quad) and hold for 5-10 seconds.   Straight Leg Raises - With your knee straight (if you were given a brace, keep it on), lift the leg to 60 degrees, hold for 3 seconds, and slowly lower the leg.  Perform this exercise against resistance later as your leg gets stronger.  Leg Slides: Lying on your back, slowly slide your foot toward your buttocks, bending your knee up off the floor (only go as far as is comfortable). Then slowly slide your foot back down until your leg is flat on the floor again.  Angel Wings: Lying on your back spread your legs to the side as far apart as you can without causing discomfort.  Hamstring Strength:  Lying on your back, push your heel against the floor with your leg straight by tightening up the muscles of your buttocks.  Repeat, but this time bend your knee to a comfortable angle, and push your heel against the floor.  You may put a pillow under the heel to make it more comfortable if necessary.   A rehabilitation program following joint replacement surgery can speed recovery and prevent re-injury in the future due to weakened muscles. Contact your doctor or a physical therapist for more information on knee rehabilitation.    CONSTIPATION  Constipation is defined medically as fewer than three stools per week and severe constipation as less than one stool per week.  Even if you have a regular bowel pattern at home, your normal regimen is likely to be disrupted due to multiple reasons following surgery.  Combination of anesthesia, postoperative narcotics, change in appetite and fluid intake all can affect your bowels.   YOU MUST use at least one of the following options; they are listed in order of increasing strength to get the job done.  They are all available over the counter, and you may need to use some, POSSIBLY even all of these options:    Drink plenty of fluids (prune juice  may be helpful) and high fiber foods Colace 100 mg by mouth twice a day  Senokot for constipation as directed and as needed Dulcolax (bisacodyl ), take with full glass of water  Miralax  (polyethylene glycol) once or twice a day as needed.  If you have tried all these things and are unable to have a bowel movement in the first 3-4 days after surgery call either your surgeon or your primary doctor.    If you experience loose stools or diarrhea, hold the medications until you stool forms back up.  If your symptoms do not get better within 1 week or if they get worse, check with your doctor.  If you experience the worst abdominal pain ever or develop nausea or vomiting, please contact the office immediately for further recommendations for treatment.   ITCHING:  If you experience itching with your medications, try taking only a single pain pill, or even half a pain pill at a time.  You can also use Benadryl  over the counter for itching or also to help with sleep.   TED HOSE STOCKINGS:  Use stockings on both legs until for at least  2 weeks or as directed by physician office. They may be removed at night for sleeping.  MEDICATIONS:  See your medication summary on the "After Visit Summary" that nursing will review with you.  You may have some home medications which will be placed on hold until you complete the course of blood thinner medication.  It is important for you to complete the blood thinner medication as prescribed.  PRECAUTIONS:  If you experience chest pain or shortness of breath - call 911 immediately for transfer to the hospital emergency department.   If you develop a fever greater that 101 F, purulent drainage from wound, increased redness or drainage from wound, foul odor from the wound/dressing, or calf pain - CONTACT YOUR SURGEON.                                                   FOLLOW-UP APPOINTMENTS:  If you do not already have a post-op appointment, please call the office for an  appointment to be seen by your surgeon.  Guidelines for how soon to be seen are listed in your "After Visit Summary", but are typically between 1-4 weeks after surgery.  OTHER INSTRUCTIONS:   Knee Replacement:  Do not place pillow under knee, focus on keeping the knee straight while resting. CPM instructions: 0-90 degrees, 2 hours in the morning, 2 hours in the afternoon, and 2 hours in the evening. Place foam block, curve side up under heel at all times except when in CPM or when walking.  DO NOT modify, tear, cut, or change the foam block in any way.   MAKE SURE YOU:  Understand these instructions.  Get help right away if you are not doing well or get worse.    Thank you for letting us  be a part of your medical care team.  It is a privilege we respect greatly.  We hope these instructions will help you stay on track for a fast and full recovery!   Diagnostic Studies: DG Knee Left Port Result Date: 08/07/2024 CLINICAL DATA:  [id number] Degenerative arthritis of left knee [id number] EXAM: PORTABLE LEFT KNEE - 1-2 VIEW COMPARISON:  October 22, 2004 FINDINGS: Well-aligned knee arthroplasty without acute fracture.No dislocation. Subcutaneous edema and gas present, an expected finding at this stage in the postoperative recovery. No radiopaque foreign body or retained surgical instrument. Anterior surgical skin staples. IMPRESSION: Well-aligned knee arthroplasty without acute fracture or dislocation.No radiopaque foreign body or retained surgical instrument. Electronically Signed   By: Rogelia Myers M.D.   On: 08/07/2024 16:27    Disposition: Discharge disposition: 01-Home or Self Care       Discharge Instructions     Call MD / Call 911   Complete by: As directed    If you experience chest pain or shortness of breath, CALL 911 and be transported to the hospital emergency room.  If you develope a fever above 101 F, pus (white drainage) or increased drainage or redness at the wound, or calf pain, call  your surgeon's office.   Change dressing   Complete by: As directed    Do not remove your dressing.   Constipation Prevention   Complete by: As directed    Drink plenty of fluids.  Prune juice may be helpful.  You may use a stool softener, such as Colace (over the counter) 100 mg  twice a day.  Use MiraLax  (over the counter) for constipation as needed.   Diet - low sodium heart healthy   Complete by: As directed    Discharge instructions   Complete by: As directed    Elevate toes above nose. Use cryotherapy as needed for pain and swelling.   Do not put a pillow under the knee. Place it under the heel.   Complete by: As directed    Driving restrictions   Complete by: As directed    No driving for 6 weeks   Increase activity slowly as tolerated   Complete by: As directed    Lifting restrictions   Complete by: As directed    No lifting for 6 weeks   Post-operative opioid taper instructions:   Complete by: As directed    POST-OPERATIVE OPIOID TAPER INSTRUCTIONS: It is important to wean off of your opioid medication as soon as possible. If you do not need pain medication after your surgery it is ok to stop day one. Opioids include: Codeine, Hydrocodone (Norco, Vicodin), Oxycodone (Percocet, oxycontin ) and hydromorphone  amongst others.  Long term and even short term use of opiods can cause: Increased pain response Dependence Constipation Depression Respiratory depression And more.  Withdrawal symptoms can include Flu like symptoms Nausea, vomiting And more Techniques to manage these symptoms Hydrate well Eat regular healthy meals Stay active Use relaxation techniques(deep breathing, meditating, yoga) Do Not substitute Alcohol to help with tapering If you have been on opioids for less than two weeks and do not have pain than it is ok to stop all together.  Plan to wean off of opioids This plan should start within one week post op of your joint replacement. Maintain the same  interval or time between taking each dose and first decrease the dose.  Cut the total daily intake of opioids by one tablet each day Next start to increase the time between doses. The last dose that should be eliminated is the evening dose.      TED hose   Complete by: As directed    Use stockings (TED hose) for 2 weeks on both leg(s).  You may remove them at night for sleeping.   Weight bearing as tolerated   Complete by: As directed         Follow-up Information     [facility] Go on 08/12/2024.   Why: You are scheduled for physical therapy Monday 08/12/24 at 9:10am; [address] Contact information: [address] [place] [address] [phone]         [name], [place]. Go on 08/20/2024.   Specialty: Orthopedic Surgery Why: You are scheduled for a post op appointment on Tuesday 08/20/24 at 10:00am Contact information: [address] [place] [address] [phone]                  Signed: Valery [name] [name] 08/08/2024, 6:04 PM

## 2024-08-08 NOTE — Progress Notes (Signed)
 Physical Therapy Treatment Patient Details Name: ARYONNA GUNNERSON MRN: [id number] DOB: 1946/04/27 Today's Date: 08/08/2024   History of Present Illness Pt s/p L TKR and with hx of COPD, DDD, CVA (no residual)    PT Comments  Pt s/p L TKR and presents with decreased L LE strength/ROM and post op pain limiting functional mobility.  Pt should progress well to dc home with family assist and reports first OP PT scheduled for 08/12/24    If plan is discharge home, recommend the following: A little help with walking and/or transfers;A little help with bathing/dressing/bathroom;Assistance with cooking/housework;Assist for transportation;Help with stairs or ramp for entrance   Can travel by private vehicle        Equipment Recommendations  Rolling walker (2 wheels) (youth)    Recommendations for Other Services       Precautions / Restrictions Precautions Precautions: Fall;Knee Restrictions Weight Bearing Restrictions Per Provider Order: Yes LLE Weight Bearing Per Provider Order: Weight bearing as tolerated     Mobility  Bed Mobility Overal bed mobility: Needs Assistance Bed Mobility: Supine to Sit     Supine to sit: Contact guard     General bed mobility comments: for safety    Transfers Overall transfer level: Needs assistance Equipment used: Rolling walker (2 wheels) Transfers: Sit to/from Stand Sit to Stand: Min assist, Contact guard assist           General transfer comment: cues for LE management and use of UEs to self assist    Ambulation/Gait Ambulation/Gait assistance: Min assist, Contact guard assist Gait Distance (Feet): 75 Feet Assistive device: Rolling walker (2 wheels) Gait Pattern/deviations: Step-to pattern, Decreased step length - right, Decreased step length - left, Shuffle, Trunk flexed Gait velocity: decr     General Gait Details: cues for sequence, posture and position from [employer]             Wheelchair Mobility     Tilt Bed     Modified Rankin (Stroke Patients Only)       Balance Overall balance assessment: Mild deficits observed, not formally tested                                          Communication Communication Communication: No apparent difficulties  Cognition Arousal: Alert Behavior During Therapy: WFL for tasks assessed/performed   PT - Cognitive impairments: No apparent impairments                         Following commands: Intact      Cueing Cueing Techniques: Verbal cues  Exercises Total Joint Exercises Ankle Circles/Pumps: AROM, Both, 15 reps, Supine Quad Sets: AROM, Both, 10 reps, Supine Heel Slides: AAROM, Left, 15 reps, Supine Straight Leg Raises: Left, 15 reps, AAROM, AROM, Supine Long Arc Quad: AAROM, Left, 10 reps, Seated    General Comments        Pertinent Vitals/Pain Pain Assessment Pain Assessment: 0-10 Pain Score: 5  Pain Location: L knee Pain Descriptors / Indicators: Aching, Sore Pain Intervention(s): Limited activity within patient's tolerance, Monitored during session, Premedicated before session, Ice applied    Home Living Family/patient expects to be discharged to:: Private residence Living Arrangements: Children Available Help at Discharge: Family;Available 24 hours/day Type of Home: House Home Access: Stairs to enter Entrance Stairs-Rails: Right;Left Entrance Stairs-Number of Steps: 3   Home  Layout: One level Home Equipment: None      Prior Function            PT Goals (current goals can now be found in the care plan section) Acute Rehab PT Goals Patient Stated Goal: Regain IND PT Goal Formulation: With patient Time For Goal Achievement: 08/15/24 Potential to Achieve Goals: Good    Frequency    7X/week      PT Plan      Co-evaluation              AM-PAC PT 6 Clicks Mobility   Outcome Measure  Help needed turning from your back to your side while in a flat bed without using bedrails?: A  Little Help needed moving from lying on your back to sitting on the side of a flat bed without using bedrails?: A Little Help needed moving to and from a bed to a chair (including a wheelchair)?: A Little Help needed standing up from a chair using your arms (e.g., wheelchair or bedside chair)?: A Little Help needed to walk in hospital room?: A Little Help needed climbing 3-5 steps with a railing? : A Lot 6 Click Score: 17    End of Session Equipment Utilized During Treatment: Gait belt Activity Tolerance: Patient tolerated treatment well Patient left: in chair;with call bell/phone within reach;with chair alarm set;with family/visitor present Nurse Communication: Mobility status PT Visit Diagnosis: Unsteadiness on feet (R26.81);Difficulty in walking, not elsewhere classified (R26.2)     Time: 9147-9074 PT Time Calculation (min) (ACUTE ONLY): 33 min  Charges:    $Therapeutic Exercise: 8-22 mins PT General Charges $$ ACUTE PT VISIT: 1 Visit                     [facility] PT Acute Rehabilitation Services Office [phone]    Devaun Hernandez 08/08/2024, 12:45 PM

## 2024-08-12 DIAGNOSIS — M25562 Pain in left knee: Secondary | ICD-10-CM | POA: Diagnosis not present

## 2024-08-13 ENCOUNTER — Other Ambulatory Visit (HOSPITAL_COMMUNITY): Payer: Self-pay

## 2024-08-15 DIAGNOSIS — M25562 Pain in left knee: Secondary | ICD-10-CM | POA: Diagnosis not present

## 2024-08-20 DIAGNOSIS — Z96652 Presence of left artificial knee joint: Secondary | ICD-10-CM | POA: Diagnosis not present

## 2024-08-22 DIAGNOSIS — M25562 Pain in left knee: Secondary | ICD-10-CM | POA: Diagnosis not present

## 2024-08-29 DIAGNOSIS — M25562 Pain in left knee: Secondary | ICD-10-CM | POA: Diagnosis not present

## 2024-09-02 ENCOUNTER — Other Ambulatory Visit (HOSPITAL_BASED_OUTPATIENT_CLINIC_OR_DEPARTMENT_OTHER): Payer: Self-pay

## 2024-09-05 DIAGNOSIS — M25562 Pain in left knee: Secondary | ICD-10-CM | POA: Diagnosis not present

## 2024-09-10 DIAGNOSIS — Z471 Aftercare following joint replacement surgery: Secondary | ICD-10-CM | POA: Diagnosis not present

## 2024-09-10 DIAGNOSIS — Z96652 Presence of left artificial knee joint: Secondary | ICD-10-CM | POA: Diagnosis not present

## 2024-09-12 DIAGNOSIS — M25562 Pain in left knee: Secondary | ICD-10-CM | POA: Diagnosis not present

## 2024-09-19 DIAGNOSIS — M25562 Pain in left knee: Secondary | ICD-10-CM | POA: Diagnosis not present

## 2024-09-26 DIAGNOSIS — M25562 Pain in left knee: Secondary | ICD-10-CM | POA: Diagnosis not present

## 2024-10-01 DIAGNOSIS — Z96652 Presence of left artificial knee joint: Secondary | ICD-10-CM | POA: Diagnosis not present

## 2024-11-06 ENCOUNTER — Ambulatory Visit: Admitting: Emergency Medicine

## 2024-11-12 ENCOUNTER — Other Ambulatory Visit (HOSPITAL_BASED_OUTPATIENT_CLINIC_OR_DEPARTMENT_OTHER): Payer: Self-pay

## 2024-11-13 ENCOUNTER — Inpatient Hospital Stay: Attending: Hematology & Oncology

## 2024-11-13 ENCOUNTER — Inpatient Hospital Stay

## 2024-11-13 ENCOUNTER — Encounter: Payer: Self-pay | Admitting: Medical Oncology

## 2024-11-13 ENCOUNTER — Inpatient Hospital Stay: Admitting: Medical Oncology

## 2024-11-13 VITALS — BP 112/80 | HR 88 | Temp 97.8°F | Resp 18 | Ht 62.0 in | Wt 124.0 lb

## 2024-11-13 DIAGNOSIS — E639 Nutritional deficiency, unspecified: Secondary | ICD-10-CM | POA: Diagnosis not present

## 2024-11-13 DIAGNOSIS — Z79899 Other long term (current) drug therapy: Secondary | ICD-10-CM | POA: Insufficient documentation

## 2024-11-13 DIAGNOSIS — Z1589 Genetic susceptibility to other disease: Secondary | ICD-10-CM

## 2024-11-13 DIAGNOSIS — D573 Sickle-cell trait: Secondary | ICD-10-CM | POA: Diagnosis not present

## 2024-11-13 DIAGNOSIS — Z7982 Long term (current) use of aspirin: Secondary | ICD-10-CM | POA: Insufficient documentation

## 2024-11-13 DIAGNOSIS — R5383 Other fatigue: Secondary | ICD-10-CM

## 2024-11-13 DIAGNOSIS — D509 Iron deficiency anemia, unspecified: Secondary | ICD-10-CM

## 2024-11-13 DIAGNOSIS — D473 Essential (hemorrhagic) thrombocythemia: Secondary | ICD-10-CM | POA: Insufficient documentation

## 2024-11-13 DIAGNOSIS — E538 Deficiency of other specified B group vitamins: Secondary | ICD-10-CM | POA: Diagnosis not present

## 2024-11-13 LAB — CBC WITH DIFFERENTIAL (CANCER CENTER ONLY)
Abs Immature Granulocytes: 0.03 K/uL (ref 0.00–0.07)
Basophils Absolute: 0.1 K/uL (ref 0.0–0.1)
Basophils Relative: 1 %
Eosinophils Absolute: 0.1 K/uL (ref 0.0–0.5)
Eosinophils Relative: 1 %
HCT: 39 % (ref 36.0–46.0)
Hemoglobin: 12.6 g/dL (ref 12.0–15.0)
Immature Granulocytes: 1 %
Lymphocytes Relative: 23 %
Lymphs Abs: 1.5 K/uL (ref 0.7–4.0)
MCH: 27.6 pg (ref 26.0–34.0)
MCHC: 32.3 g/dL (ref 30.0–36.0)
MCV: 85.5 fL (ref 80.0–100.0)
Monocytes Absolute: 0.6 K/uL (ref 0.1–1.0)
Monocytes Relative: 9 %
Neutro Abs: 4.2 K/uL (ref 1.7–7.7)
Neutrophils Relative %: 65 %
Platelet Count: 412 K/uL — ABNORMAL HIGH (ref 150–400)
RBC: 4.56 MIL/uL (ref 3.87–5.11)
RDW: 17 % — ABNORMAL HIGH (ref 11.5–15.5)
WBC Count: 6.4 K/uL (ref 4.0–10.5)
nRBC: 0 % (ref 0.0–0.2)

## 2024-11-13 LAB — IRON AND IRON BINDING CAPACITY (CC-WL,HP ONLY)
Iron: 55 ug/dL (ref 28–170)
Saturation Ratios: 23 % (ref 10.4–31.8)
TIBC: 239 ug/dL — ABNORMAL LOW (ref 250–450)
UIBC: 184 ug/dL

## 2024-11-13 LAB — CMP (CANCER CENTER ONLY)
ALT: 11 U/L (ref 0–44)
AST: 19 U/L (ref 15–41)
Albumin: 4.1 g/dL (ref 3.5–5.0)
Alkaline Phosphatase: 79 U/L (ref 38–126)
Anion gap: 11 (ref 5–15)
BUN: 8 mg/dL (ref 8–23)
CO2: 26 mmol/L (ref 22–32)
Calcium: 9.5 mg/dL (ref 8.9–10.3)
Chloride: 107 mmol/L (ref 98–111)
Creatinine: 0.76 mg/dL (ref 0.44–1.00)
GFR, Estimated: >60 mL/min (ref >60)
Glucose, Bld: 83 mg/dL (ref 70–99)
Potassium: 4.1 mmol/L (ref 3.5–5.1)
Sodium: 143 mmol/L (ref 135–145)
Total Bilirubin: 0.6 mg/dL (ref 0.0–1.2)
Total Protein: 6.7 g/dL (ref 6.5–8.1)

## 2024-11-13 LAB — VITAMIN B12: Vitamin B-12: 377 pg/mL (ref 180–914)

## 2024-11-13 LAB — RETIC PANEL
Immature Retic Fract: 25.8 % — ABNORMAL HIGH (ref 2.3–15.9)
RBC.: 4.53 MIL/uL (ref 3.87–5.11)
Retic Count, Absolute: 88.3 K/uL (ref 19.0–186.0)
Retic Ct Pct: 2 % (ref 0.4–3.1)
Reticulocyte Hemoglobin: 27 pg — ABNORMAL LOW (ref >27.9)

## 2024-11-13 LAB — LACTATE DEHYDROGENASE: LDH: 263 U/L — ABNORMAL HIGH (ref 105–235)

## 2024-11-13 LAB — FERRITIN: Ferritin: 139 ng/mL (ref 11–307)

## 2024-11-13 LAB — FOLATE: Folate: 20 ng/mL (ref >5.9)

## 2024-11-13 NOTE — Progress Notes (Signed)
 Hematology and Oncology Follow Up Visit  GIOVANNA KEMMERER [id number] 1946/06/14 78 y.o. 11/13/2024   Principle Diagnosis:  Essential thrombocythemia - JAK2 POSITIVE Sickle cell trait Nutritional Deficiency  Current Therapy:   Hydrea  500 mg PO TID - dose changed on 05/20/2019 Folic acid  1 mg by mouth daily Aspirin  81 mg daily  Oral iron once daily   Interim History:  Ms. Rampersad is here today for follow-up. S/p endoscopy/colonoscopy 2023- benign polyps. She had a CT of abdomen/pelvis on 12/03/2021 that showed a soft tissue density of the stomach concerning for malignancy. Fortunately her endoscopy was reassuring.   At her last visit we reduced her hydrea  to help with anemia and decreased appetite.   Last week her PCP increased her Paxil  to help with fatigue and weight loss.She denies night sweats, abdominal pains, cough, SOB.  She reports her mood as stable without SI/HI  She has been taking her hydrea  as prescribed. No side effects other than possible decreased appetite   Since her last visit she has had left knee replaced. This went well. She has graduate from PT.   She has had no problems with nausea or vomiting.  She has had no problems with the sickle cell trait.  There has been no bleeding to her knowledge: denies epistaxis, gingivitis, hemoptysis, hematemesis, hematuria, melena, excessive bruising, blood donation.   Overall, I would say that her performance status is probably ECOG 1.      Wt Readings from Last 3 Encounters:  11/13/24 124 lb (56.2 kg)  08/07/24 129 lb (58.5 kg)  07/26/24 129 lb (58.5 kg)   Medications:  Allergies as of 11/13/2024       Reactions   Statins Other (See Comments)   Leg cramps   Penicillins Itching, Rash   Did it involve swelling of the face/tongue/throat, SOB, or low BP? N Did it involve sudden or severe rash/hives, skin peeling, or any reaction on the inside of your mouth or nose? Y Did you need to seek medical attention at a  hospital or doctor's office? N When did it last happen? Decades Ago      If all above answers are NO, may proceed with cephalosporin use. Tolerated Cephalosporin Date: 08/07/2024.   Pravastatin  Other (See Comments)   leg cramps        Medication List        Accurate as of November 13, 2024  9:50 AM. If you have any questions, ask your nurse or doctor.          STOP taking these medications    oxyCODONE  5 MG immediate release tablet Commonly known as: Roxicodone  Stopped by: Lauraine CHRISTELLA Dais       TAKE these medications    acetaminophen  500 MG tablet Commonly known as: TYLENOL  Take 500-1,000 mg by mouth every 6 (six) hours as needed (pain.).   albuterol  108 (90 Base) MCG/ACT inhaler Commonly known as: Proventil  HFA Inhale 1-2 puffs into the lungs every 6 (six) hours as needed for wheezing or shortness of breath.   Breztri  Aerosphere 160-9-4.8 MCG/ACT Aero inhaler Generic drug: budesonide -glycopyrrolate -formoterol  1 sample provided What changed: Another medication with the same name was removed. Continue taking this medication, and follow the directions you see here. Changed by: Lauraine CHRISTELLA Dais   CALCIUM -CHOLECALCIFEROL  PO Take 1 tablet by mouth in the morning.   CENTRUM SILVER 50+WOMEN PO Take 1 tablet by mouth in the morning.   cetirizine  10 MG tablet Commonly known as: ZyrTEC  Allergy Take 1 tablet (  10 mg total) by mouth daily.   cholecalciferol  25 MCG (1000 UNIT) tablet Commonly known as: VITAMIN D3 Take 1,000 Units by mouth daily.   cyanocobalamin  1000 MCG tablet Commonly known as: VITAMIN B12 Take 1,000 mcg by mouth in the morning and at bedtime.   ferrous sulfate  325 (65 FE) MG tablet Take 325 mg by mouth daily.   fluticasone  50 MCG/ACT nasal spray Commonly known as: FLONASE  Place 1 spray into the nose 2 (two) times daily.   folic acid  400 MCG tablet Commonly known as: FOLVITE  Take 400 mcg by mouth in the morning.   gabapentin  100 MG  capsule Commonly known as: NEURONTIN  Take 100 mg by mouth 2 (two) times daily.   hydroxyurea  500 MG capsule Commonly known as: HYDREA  Take 1 capsule (500 mg total) by mouth 3 (three) times daily before meals. What changed: when to take this   ipratropium 0.02 % nebulizer solution Commonly known as: ATROVENT  Take 2.5 mLs (0.5 mg total) by nebulization every 6 (six) hours. What changed:  when to take this reasons to take this   ipratropium 17 MCG/ACT inhaler Commonly known as: ATROVENT  HFA Inhale 2 puffs into the lungs every 6 (six) hours as needed for wheezing.   levalbuterol  0.63 MG/3ML nebulizer solution Commonly known as: XOPENEX  Take 3 mLs (0.63 mg total) by nebulization every 6 (six) hours. What changed:  when to take this reasons to take this   magnesium  oxide 400 MG tablet Commonly known as: MAG-OX Take 400 mg by mouth in the morning.   methotrexate  2.5 MG tablet Commonly known as: RHEUMATREX Take 2.5 mg by mouth every Monday.   montelukast  10 MG tablet Commonly known as: SINGULAIR  Take 1 tablet (10 mg total) by mouth at bedtime.   ondansetron  4 MG tablet Commonly known as: Zofran  Take 1 tablet (4 mg total) by mouth every 8 (eight) hours as needed for nausea or vomiting.   pantoprazole  20 MG tablet Commonly known as: PROTONIX  Take 1 tablet (20 mg total) by mouth 2 (two) times daily.   PARoxetine  20 MG tablet Commonly known as: PAXIL  Take 20 mg by mouth every morning.   rosuvastatin  10 MG tablet Commonly known as: CRESTOR  Take 10 mg by mouth at bedtime.        Allergies:  Allergies  Allergen Reactions   Statins Other (See Comments)    Leg cramps   Penicillins Itching and Rash    Did it involve swelling of the face/tongue/throat, SOB, or low BP? N Did it involve sudden or severe rash/hives, skin peeling, or any reaction on the inside of your mouth or nose? Y Did you need to seek medical attention at a hospital or doctor's office? N When did it  last happen? Decades Ago      If all above answers are NO, may proceed with cephalosporin use.  Tolerated Cephalosporin Date: 08/07/2024.     Pravastatin  Other (See Comments)    leg cramps    Past Medical History, Surgical history, Social history, and Family History were reviewed and updated.  Review of Systems:   Review of Systems  Constitutional: Negative.   HENT: Negative.    Eyes: Negative.   Respiratory: Negative.    Cardiovascular: Negative.   Gastrointestinal: Negative.   Genitourinary: Negative.   Musculoskeletal: Negative.   Skin: Negative.   Neurological: Negative.   Endo/Heme/Allergies: Negative.   Psychiatric/Behavioral: Negative.       Physical Exam:  height is 5' 2 (1.575 m) and weight is  124 lb (56.2 kg). Her oral temperature is 97.8 F (36.6 C). Her blood pressure is 112/80 and her pulse is 88. Her respiration is 18 and oxygen  saturation is 100%.   Wt Readings from Last 3 Encounters:  11/13/24 124 lb (56.2 kg)  08/07/24 129 lb (58.5 kg)  07/26/24 129 lb (58.5 kg)   Physical Activity: Inactive (03/01/2021)   Exercise Vital Sign    Days of Exercise per Week: 0 days    Minutes of Exercise per Session: 0 min     Physical Exam Vitals reviewed.  HENT:     Head: Normocephalic and atraumatic.  Eyes:     Pupils: Pupils are equal, round, and reactive to light.  Cardiovascular:     Rate and Rhythm: Normal rate and regular rhythm.     Heart sounds: Normal heart sounds.  Pulmonary:     Effort: Pulmonary effort is normal.     Breath sounds: No wheezing.  Abdominal:     General: Bowel sounds are normal.     Palpations: Abdomen is soft.  Musculoskeletal:        General: No tenderness or deformity. Normal range of motion.     Cervical back: Normal range of motion.  Lymphadenopathy:     Cervical: No cervical adenopathy.  Skin:    General: Skin is warm and dry.     Findings: No erythema or rash.  Neurological:     Mental Status: She is alert and  oriented to person, place, and time.  Psychiatric:        Behavior: Behavior normal.        Thought Content: Thought content normal.        Judgment: Judgment normal.      Lab Results  Component Value Date   WBC 6.4 11/13/2024   HGB 12.6 11/13/2024   HCT 39.0 11/13/2024   MCV 85.5 11/13/2024   PLT 412 (H) 11/13/2024   Lab Results  Component Value Date   FERRITIN 185 07/01/2024   IRON 96 07/01/2024   TIBC 224 (L) 07/01/2024   UIBC 128 07/01/2024   IRONPCTSAT 43 (H) 07/01/2024   Lab Results  Component Value Date   RETICCTPCT 2.0 11/13/2024   RBC 4.53 11/13/2024   RBC 4.56 11/13/2024   RETICCTABS 73.1 08/06/2015   No results found for: JONATHAN BONG [facility] Lab Results  Component Value Date   IGGSERUM 685 09/07/2020   IGA 293 09/07/2020   IGMSERUM 26 09/07/2020   No results found for: STEPHANY CARLOTA BENSON MARKEL EARLA JOANNIE DOC VICK, SPEI   Chemistry      Component Value Date/Time   NA 139 08/08/2024 0340   NA 145 11/24/2017 0754   NA 142 05/25/2017 0755   K 4.2 08/08/2024 0340   K 3.7 11/24/2017 0754   K 3.9 05/25/2017 0755   CL 107 08/08/2024 0340   CL 104 11/24/2017 0754   CO2 23 08/08/2024 0340   CO2 27 11/24/2017 0754   CO2 24 05/25/2017 0755   BUN 7 (L) 08/08/2024 0340   BUN 12 11/24/2017 0754   BUN 13.3 05/25/2017 0755   CREATININE 0.86 08/08/2024 0340   CREATININE 0.80 07/01/2024 0959   CREATININE 1.0 11/24/2017 0754   CREATININE 0.8 05/25/2017 0755      Component Value Date/Time   CALCIUM  8.3 (L) 08/08/2024 0340   CALCIUM  9.1 11/24/2017 0754   CALCIUM  9.5 05/25/2017 0755   ALKPHOS 85 07/01/2024 0959   ALKPHOS 70 11/24/2017 0754   ALKPHOS  74 05/25/2017 0755   AST 23 07/01/2024 0959   AST 21 05/25/2017 0755   ALT 11 07/01/2024 0959   ALT 18 11/24/2017 0754   ALT 14 05/25/2017 0755   BILITOT 0.5 07/01/2024 0959   BILITOT 0.37 05/25/2017 0755     Encounter Diagnoses  Name  Primary?   Essential thrombocythemia (HCC) Yes   JAK-2 gene mutation    Poor nutrition    Iron deficiency anemia, unspecified iron deficiency anemia type    B12 deficiency    Other fatigue    Sickle cell trait     Impression and Plan: Ms. Secrest is a very pleasant 78 yo African American female with essential thrombocythemia, that is JAK2 positive.  In addition, she has sickle cell trait and nutritional deficiency. She is on a once daily 81 mg asa.   The reduced the hydrea  dose has helped her blood counts. We will stay at current dose.  Nutritional labs pending She will continue ensures and follow up with PCP regarding the Paxil   Disposition RTC 2 month APP, labs   Lauraine CHRISTELLA Dais, PA-C 12/10/20259:50 AM

## 2024-11-18 ENCOUNTER — Ambulatory Visit: Payer: Self-pay | Admitting: Medical Oncology

## 2024-11-18 NOTE — Telephone Encounter (Signed)
 As noted below by Lauraine Dais, PA, I informed the patient that the iron and folate levels look good. The B12 is low, so please restart your B12 supplement every other day. She verbalized understanding.

## 2024-11-18 NOTE — Telephone Encounter (Signed)
-----   Message from Lauraine CHRISTELLA Dais sent at 11/18/2024 12:32 PM EST ----- Iron levels look good Folate levels look good B12 is low- lets have her restart her B12 supplement every other day

## 2024-11-20 ENCOUNTER — Encounter: Payer: Self-pay | Admitting: Emergency Medicine

## 2024-11-20 ENCOUNTER — Ambulatory Visit: Admitting: Emergency Medicine

## 2024-11-20 VITALS — BP 117/78 | HR 88 | Temp 98.2°F | Ht 62.0 in | Wt 128.0 lb

## 2024-11-20 DIAGNOSIS — J849 Interstitial pulmonary disease, unspecified: Secondary | ICD-10-CM | POA: Diagnosis not present

## 2024-11-20 DIAGNOSIS — J383 Other diseases of vocal cords: Secondary | ICD-10-CM | POA: Diagnosis not present

## 2024-11-20 DIAGNOSIS — J449 Chronic obstructive pulmonary disease, unspecified: Secondary | ICD-10-CM

## 2024-11-20 DIAGNOSIS — J432 Centrilobular emphysema: Secondary | ICD-10-CM

## 2024-11-20 NOTE — Assessment & Plan Note (Signed)
 There were cost issues and now I am not sure what she is on.  She likes the Breztri  but it was expensive.  She did not qualify for patient assistance.  She thinks she is on an alternative but we do not know the name.  I will have her bring all her medications and so we can sort this out.  Then we will likely need to discuss with our pharmacist to find the best (nonpowdered) regimen

## 2024-11-20 NOTE — Patient Instructions (Signed)
 We will arrange for follow-up visit with all your medications so we can ensure that we are clear on which inhalers and which of the medications you are using.  Bring them all with you. Would like to get you back on Breztri  twice a day if we can ensure that this is affordable for you. Keep your albuterol  available to see the 2 puffs or 1 nebulizer treatment up to every 4 hours if needed for shortness of breath, chest tightness, wheezing. Flu shot is up-to-date Please continue your reflux medication as you have been taking Please continue your allergy medication as you have been taken Follow in our office in about 1 month for medication review Follow with Dr. Shelah in 6 months.  Call sooner if you have any problems

## 2024-11-20 NOTE — Assessment & Plan Note (Signed)
 Good control although she does have flares a few times a week.  Continue to aggressively manage rhinitis, GERD, contributing factors.

## 2024-11-20 NOTE — Assessment & Plan Note (Signed)
 Last imaging July 2025, overall stable.  Will talk about appropriate surveillance imaging going forward.

## 2024-11-20 NOTE — Progress Notes (Signed)
°  Subjective:    Patient ID: [name], female    DOB: 01/26/1946   MRN: [id number] HPI  ROV 03/27/24 --pleasant 78 year old woman with a history of moderate persistent asthma and chronic upper airway irritation with vocal cord dysfunction, chronic cough.  Also with rheumatoid arthritis and some mild associated subpleural interstitial lung disease.  She is on an aggressive allergy regimen, GERD regimen for her upper airway irritation.  She is here today for preoperative evaluation in preparation for planned knee surgery. Dr Cherylene planning  She reports today that she has been doing well, especially since she started Breztri , only using albuterol  once a day, usually in the morning. No big flares, no ER visits or prednisone . She occasionally has some smaller flares of throat irritation. Her GERD is well controlled. She is able to walk.   ROV 11/20/2024 --follow-up visit for [facility].  She is 17 with history of upper airway irritation syndrome and vocal cord dysfunction, chronic cough, moderate persistent asthma.  She has rheumatoid arthritis and some associated mild subpleural interstitial lung disease.  Treated aggressively for GERD, chronic rhinitis. She is overall doing well. About 2x a week she will have spells of cough. She uses her albuterol  nebs when this happens. She has been most recently on Breztri , but it is more expensive and is having a difficult time obtaining - she has been off of it for a month. She has a substitute but not sure what it is. Flu shot up to date.    Objective:   Physical Exam Vitals:   11/20/24 1315  BP: 117/78  Pulse: 88  Temp: 98.2 F (36.8 C)  TempSrc: Oral  SpO2: 99%  Weight: 128 lb (58.1 kg)  Height: 5' 2 (1.575 m)     Gen: Pleasant, overwt, in no distress,  normal affect  ENT: No lesions,  mouth clear,  oropharynx clear, no postnasal drip, strong voice  Neck: supple, mild expiratory stridor  Lungs : distant, some referred upper airway noise,  scattered low pitched wheezes bilaterally  Cardiovascular: RRR, heart sounds normal, no murmur or gallops, no peripheral edema  Musculoskeletal: No deformities, no cyanosis or clubbing  Neuro: alert, non focal  Skin: Warm, no rash    Assessment & Plan:   COPD (chronic obstructive pulmonary disease) (HCC) There were cost issues and now I am not sure what she is on.  She likes the Breztri  but it was expensive.  She did not qualify for patient assistance.  She thinks she is on an alternative but we do not know the name.  I will have her bring all her medications and so we can sort this out.  Then we will likely need to discuss with our pharmacist to find the best (nonpowdered) regimen  ILD (interstitial lung disease) [facility]) Last imaging July 2025, overall stable.  Will talk about appropriate surveillance imaging going forward.  Vocal cord dysfunction Good control although she does have flares a few times a week.  Continue to aggressively manage rhinitis, GERD, contributing factors.       Lamar Chris, MD, PhD 11/20/2024, 1:41 PM [facility] [phone] or if no answer [phone]

## 2024-12-31 ENCOUNTER — Ambulatory Visit: Admitting: Adult Health

## 2025-01-14 ENCOUNTER — Inpatient Hospital Stay

## 2025-01-14 ENCOUNTER — Inpatient Hospital Stay: Admitting: Medical Oncology

## 2025-01-16 ENCOUNTER — Ambulatory Visit: Admitting: Adult Health

## 2025-05-21 ENCOUNTER — Ambulatory Visit: Admitting: Emergency Medicine
